# Patient Record
Sex: Male | Born: 1960
Health system: Southern US, Community
[De-identification: ages and names within clinical notes are randomized; demographics above are authoritative.]

## PROBLEM LIST (undated history)

## (undated) DIAGNOSIS — I5189 Other ill-defined heart diseases: Secondary | ICD-10-CM

## (undated) DIAGNOSIS — Z992 Dependence on renal dialysis: Secondary | ICD-10-CM

## (undated) DIAGNOSIS — L03119 Cellulitis of unspecified part of limb: Secondary | ICD-10-CM

## (undated) DIAGNOSIS — L02619 Cutaneous abscess of unspecified foot: Secondary | ICD-10-CM

## (undated) DIAGNOSIS — N186 End stage renal disease: Secondary | ICD-10-CM

## (undated) DIAGNOSIS — J189 Pneumonia, unspecified organism: Secondary | ICD-10-CM

## (undated) DIAGNOSIS — I119 Hypertensive heart disease without heart failure: Secondary | ICD-10-CM

## (undated) DIAGNOSIS — I5030 Unspecified diastolic (congestive) heart failure: Secondary | ICD-10-CM

## (undated) DIAGNOSIS — I10 Essential (primary) hypertension: Secondary | ICD-10-CM

## (undated) DIAGNOSIS — F1410 Cocaine abuse, uncomplicated: Secondary | ICD-10-CM

## (undated) DIAGNOSIS — E119 Type 2 diabetes mellitus without complications: Secondary | ICD-10-CM

## (undated) DIAGNOSIS — K219 Gastro-esophageal reflux disease without esophagitis: Secondary | ICD-10-CM

## (undated) DIAGNOSIS — D649 Anemia, unspecified: Secondary | ICD-10-CM

## (undated) DIAGNOSIS — I739 Peripheral vascular disease, unspecified: Secondary | ICD-10-CM

## (undated) DIAGNOSIS — E46 Unspecified protein-calorie malnutrition: Secondary | ICD-10-CM

## (undated) HISTORY — DX: Cellulitis of unspecified part of limb: L03.119

## (undated) HISTORY — DX: Dependence on renal dialysis: N18.6

## (undated) HISTORY — DX: Hypertensive heart disease without heart failure: I11.9

## (undated) HISTORY — DX: Dependence on renal dialysis: Z99.2

## (undated) HISTORY — DX: Cutaneous abscess of unspecified foot: L02.619

---
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
## 2007-09-04 HISTORY — PX: KNEE SURGERY: SHX244

## 2010-09-06 ENCOUNTER — Encounter
Admission: RE | Admit: 2010-09-06 | Discharge: 2010-09-06 | Payer: Self-pay | Source: Home / Self Care | Attending: Family Medicine | Admitting: Family Medicine

## 2011-08-23 ENCOUNTER — Ambulatory Visit: Payer: Worker's Compensation

## 2011-08-23 ENCOUNTER — Encounter: Payer: Self-pay | Admitting: Emergency Medicine

## 2011-08-23 ENCOUNTER — Inpatient Hospital Stay (HOSPITAL_COMMUNITY)
Admission: EM | Admit: 2011-08-23 | Discharge: 2011-08-27 | DRG: 603 | Disposition: A | Discharge status: Home / Self Care | Payer: Worker's Compensation | Attending: Internal Medicine | Admitting: Internal Medicine

## 2011-08-23 DIAGNOSIS — X58XXXA Exposure to other specified factors, initial encounter: Secondary | ICD-10-CM | POA: Diagnosis present

## 2011-08-23 DIAGNOSIS — D509 Iron deficiency anemia, unspecified: Secondary | ICD-10-CM | POA: Diagnosis present

## 2011-08-23 DIAGNOSIS — M795 Residual foreign body in soft tissue: Secondary | ICD-10-CM

## 2011-08-23 DIAGNOSIS — L02619 Cutaneous abscess of unspecified foot: Principal | ICD-10-CM | POA: Diagnosis present

## 2011-08-23 DIAGNOSIS — S91109A Unspecified open wound of unspecified toe(s) without damage to nail, initial encounter: Secondary | ICD-10-CM | POA: Diagnosis present

## 2011-08-23 DIAGNOSIS — D631 Anemia in chronic kidney disease: Secondary | ICD-10-CM | POA: Diagnosis present

## 2011-08-23 DIAGNOSIS — L0390 Cellulitis, unspecified: Secondary | ICD-10-CM

## 2011-08-23 DIAGNOSIS — D649 Anemia, unspecified: Secondary | ICD-10-CM | POA: Diagnosis present

## 2011-08-23 DIAGNOSIS — L03039 Cellulitis of unspecified toe: Principal | ICD-10-CM | POA: Diagnosis present

## 2011-08-23 DIAGNOSIS — R739 Hyperglycemia, unspecified: Secondary | ICD-10-CM | POA: Diagnosis present

## 2011-08-23 DIAGNOSIS — I10 Essential (primary) hypertension: Secondary | ICD-10-CM | POA: Diagnosis present

## 2011-08-23 DIAGNOSIS — E1169 Type 2 diabetes mellitus with other specified complication: Secondary | ICD-10-CM | POA: Diagnosis present

## 2011-08-23 NOTE — ED Notes (Signed)
PT. REPORTS METAL INSIDE LEFT BIG TOE , SEEN AT AN URGENT CARE FOR LEFT BIG TOE PAIN AND SWELLING.

## 2011-08-24 ENCOUNTER — Encounter (HOSPITAL_COMMUNITY): Payer: Self-pay | Admitting: Emergency Medicine

## 2011-08-24 ENCOUNTER — Emergency Department (HOSPITAL_COMMUNITY): Payer: Worker's Compensation

## 2011-08-24 DIAGNOSIS — L02619 Cutaneous abscess of unspecified foot: Secondary | ICD-10-CM | POA: Diagnosis present

## 2011-08-24 DIAGNOSIS — R739 Hyperglycemia, unspecified: Secondary | ICD-10-CM | POA: Diagnosis present

## 2011-08-24 DIAGNOSIS — L03119 Cellulitis of unspecified part of limb: Secondary | ICD-10-CM

## 2011-08-24 DIAGNOSIS — D649 Anemia, unspecified: Secondary | ICD-10-CM | POA: Diagnosis present

## 2011-08-24 HISTORY — DX: Cutaneous abscess of unspecified foot: L02.619

## 2011-08-24 HISTORY — DX: Cellulitis of unspecified part of limb: L03.119

## 2011-08-24 LAB — IRON AND TIBC
Iron: 33 ug/dL — ABNORMAL LOW (ref 42–135)
Saturation Ratios: 17 % — ABNORMAL LOW (ref 20–55)
TIBC: 192 ug/dL — ABNORMAL LOW (ref 215–435)
UIBC: 159 ug/dL (ref 125–400)

## 2011-08-24 LAB — DIFFERENTIAL
Basophils Absolute: 0 10*3/uL (ref 0.0–0.1)
Lymphocytes Relative: 21 % (ref 12–46)
Lymphs Abs: 2.8 10*3/uL (ref 0.7–4.0)
Monocytes Absolute: 1.3 10*3/uL — ABNORMAL HIGH (ref 0.1–1.0)
Monocytes Relative: 9 % (ref 3–12)
Neutro Abs: 8.9 10*3/uL — ABNORMAL HIGH (ref 1.7–7.7)

## 2011-08-24 LAB — CBC
HCT: 34.6 % — ABNORMAL LOW (ref 39.0–52.0)
HCT: 35.2 % — ABNORMAL LOW (ref 39.0–52.0)
Hemoglobin: 11.6 g/dL — ABNORMAL LOW (ref 13.0–17.0)
Hemoglobin: 11.7 g/dL — ABNORMAL LOW (ref 13.0–17.0)
MCV: 92.3 fL (ref 78.0–100.0)
RBC: 3.75 MIL/uL — ABNORMAL LOW (ref 4.22–5.81)
RBC: 3.82 MIL/uL — ABNORMAL LOW (ref 4.22–5.81)
RDW: 13.3 % (ref 11.5–15.5)
WBC: 10.8 10*3/uL — ABNORMAL HIGH (ref 4.0–10.5)
WBC: 13.4 10*3/uL — ABNORMAL HIGH (ref 4.0–10.5)

## 2011-08-24 LAB — FOLATE: Folate: 7.5 ng/mL

## 2011-08-24 LAB — COMPREHENSIVE METABOLIC PANEL
Albumin: 1.9 g/dL — ABNORMAL LOW (ref 3.5–5.2)
BUN: 19 mg/dL (ref 6–23)
Calcium: 8.6 mg/dL (ref 8.4–10.5)
Creatinine, Ser: 1.25 mg/dL (ref 0.50–1.35)
GFR calc Af Amer: 76 mL/min — ABNORMAL LOW (ref >90)
Glucose, Bld: 198 mg/dL — ABNORMAL HIGH (ref 70–99)
Potassium: 3.5 mEq/L (ref 3.5–5.1)
Total Protein: 6 g/dL (ref 6.0–8.3)

## 2011-08-24 LAB — RETICULOCYTES: Retic Count, Absolute: 45 10*3/uL (ref 19.0–186.0)

## 2011-08-24 LAB — POCT I-STAT, CHEM 8
BUN: 22 mg/dL (ref 6–23)
Calcium, Ion: 1.2 mmol/L (ref 1.12–1.32)
Chloride: 106 mEq/L (ref 96–112)
Creatinine, Ser: 1.4 mg/dL — ABNORMAL HIGH (ref 0.50–1.35)
Glucose, Bld: 260 mg/dL — ABNORMAL HIGH (ref 70–99)

## 2011-08-24 LAB — FERRITIN: Ferritin: 141 ng/mL (ref 22–322)

## 2011-08-24 LAB — GLUCOSE, CAPILLARY: Glucose-Capillary: 151 mg/dL — ABNORMAL HIGH (ref 70–99)

## 2011-08-24 LAB — HEMOGLOBIN A1C: Hgb A1c MFr Bld: 8.2 % — ABNORMAL HIGH (ref <5.7)

## 2011-08-24 MED ORDER — INSULIN ASPART 100 UNIT/ML ~~LOC~~ SOLN
0.0000 [IU] | Freq: Three times a day (TID) | SUBCUTANEOUS | Status: DC
  Administered 2011-08-24: 2 [IU] via SUBCUTANEOUS
  Administered 2011-08-24: 1 [IU] via SUBCUTANEOUS
  Administered 2011-08-25 – 2011-08-26 (×3): 2 [IU] via SUBCUTANEOUS
  Administered 2011-08-26 – 2011-08-27 (×2): 1 [IU] via SUBCUTANEOUS
  Filled 2011-08-24: qty 3

## 2011-08-24 MED ORDER — ACETAMINOPHEN 325 MG PO TABS
650.0000 mg | ORAL_TABLET | Freq: Four times a day (QID) | ORAL | Status: DC | PRN
  Filled 2011-08-24: qty 2

## 2011-08-24 MED ORDER — FENTANYL CITRATE 0.05 MG/ML IJ SOLN
50.0000 ug | Freq: Once | INTRAMUSCULAR | Status: DC
  Filled 2011-08-24: qty 2

## 2011-08-24 MED ORDER — TETANUS-DIPHTH-ACELL PERTUSSIS 5-2.5-18.5 LF-MCG/0.5 IM SUSP
0.5000 mL | Freq: Once | INTRAMUSCULAR | Status: AC
  Administered 2011-08-24: 0.5 mL via INTRAMUSCULAR
  Filled 2011-08-24: qty 0.5

## 2011-08-24 MED ORDER — PIPERACILLIN-TAZOBACTAM 3.375 G IVPB
3.3750 g | Freq: Three times a day (TID) | INTRAVENOUS | Status: DC
  Administered 2011-08-24 – 2011-08-27 (×10): 3.375 g via INTRAVENOUS
  Filled 2011-08-24 (×13): qty 50

## 2011-08-24 MED ORDER — OXYCODONE HCL 5 MG PO TABS
5.0000 mg | ORAL_TABLET | ORAL | Status: DC | PRN
  Administered 2011-08-24 – 2011-08-27 (×10): 5 mg via ORAL
  Filled 2011-08-24 (×11): qty 1

## 2011-08-24 MED ORDER — VANCOMYCIN HCL IN DEXTROSE 1-5 GM/200ML-% IV SOLN: 1000.0000 mg | Freq: Once | INTRAVENOUS | Status: DC

## 2011-08-24 MED ORDER — FENTANYL CITRATE 0.05 MG/ML IJ SOLN: 12.5000 ug | INTRAMUSCULAR | Status: DC | PRN

## 2011-08-24 MED ORDER — VANCOMYCIN HCL IN DEXTROSE 1-5 GM/200ML-% IV SOLN
1000.0000 mg | Freq: Two times a day (BID) | INTRAVENOUS | Status: DC
  Administered 2011-08-24 – 2011-08-27 (×7): 1000 mg via INTRAVENOUS
  Filled 2011-08-24 (×10): qty 200

## 2011-08-24 MED ORDER — ONDANSETRON HCL 4 MG PO TABS: 4.0000 mg | ORAL_TABLET | Freq: Four times a day (QID) | ORAL | Status: DC | PRN

## 2011-08-24 MED ORDER — ONDANSETRON HCL 4 MG/2ML IJ SOLN: 4.0000 mg | Freq: Four times a day (QID) | INTRAMUSCULAR | Status: DC | PRN

## 2011-08-24 MED ORDER — ACETAMINOPHEN 650 MG RE SUPP: 650.0000 mg | Freq: Four times a day (QID) | RECTAL | Status: DC | PRN

## 2011-08-24 MED ORDER — PIPERACILLIN-TAZOBACTAM 3.375 G IVPB
3.3750 g | Freq: Once | INTRAVENOUS | Status: AC
  Administered 2011-08-24: 3.375 g via INTRAVENOUS
  Filled 2011-08-24: qty 50

## 2011-08-24 MED ORDER — LIVING WELL WITH DIABETES BOOK
Freq: Once | Status: AC
  Administered 2011-08-24: 22:00:00
  Filled 2011-08-24: qty 1

## 2011-08-24 MED ORDER — SODIUM CHLORIDE 0.9 % IV SOLN
INTRAVENOUS | Status: DC
  Administered 2011-08-24: 1000 mL via INTRAVENOUS
  Administered 2011-08-25 – 2011-08-27 (×5): via INTRAVENOUS

## 2011-08-24 NOTE — Progress Notes (Signed)
ANTIBIOTIC CONSULT NOTE - INITIAL  Pharmacy Consult for Vancocin/Zosyn Indication: cellulitis  No Known Allergies  Patient Measurements: Height: 5\' 11"  (180.3 cm) Weight: 194 lb (87.998 kg) IBW/kg (Calculated) : 75.3   Vital Signs: Temp: 98 F (36.7 C) (12/21 0242) Temp src: Oral (12/21 0242) BP: 159/88 mmHg (12/21 0242) Pulse Rate: 78  (12/21 0242)  Labs:  Basename 08/24/11 0041 08/24/11 0017  WBC -- 13.4*  HGB 11.6* 11.7*  PLT -- 396  LABCREA -- --  CREATININE 1.40* --   Estimated Creatinine Clearance: 67.2 ml/min (by C-G formula based on Cr of 1.4).  Microbiology: No results found for this or any previous visit (from the past 720 hour(s)).  Medical History: History reviewed. No pertinent past medical history.  Medications:  Prescriptions prior to admission  Medication Sig Dispense Refill  . ibuprofen (ADVIL,MOTRIN) 200 MG tablet Take 400 mg by mouth every 4 (four) hours as needed. For pain        Scheduled:    . insulin aspart  0-9 Units Subcutaneous TID WC  . piperacillin-tazobactam (ZOSYN)  IV  3.375 g Intravenous Once  . TDaP  0.5 mL Intramuscular Once  . vancomycin  1,000 mg Intravenous Once  . DISCONTD: fentaNYL  50 mcg Intravenous Once   Assessment: 51yo male c/o swelling and pain of L foot x3-4d, Xray at urgent care showed small foreign body in distal phalynx, to begin IV ABX for cellulitis while awaiting ortho consult.  Goal of Therapy:  Vancomycin trough level 10-15 mcg/ml  Plan:  Will begin vancomycin 1000mg  IV Q12H and Zosyn 3.375g IV Q8H.  Monitor CBC, Cx, levels prn.  Colleen Can PharmD BCPS 08/24/2011,5:51 AM

## 2011-08-24 NOTE — Progress Notes (Signed)
Went in to speak with pt with new onset diabetes.  Reviewed basic home care.  Reviewed A1C goals and CBG goals with pt.  Ordered RD consult, DM videos, DM booklet, [facility] nursing notes.  RN to provide continuing DM education at bedside.  Placed CBG meter Rx in shadow chart.  RN to review proper CBG testing with pt.  Will likely be d/c'd home on oral medications.  Encouraged pt to keep a CBG diary.  Told pt about inexpensive CBG meter he can purchase OTC at [facility].  Will place care mgmt consult for pt.  He currently does not have a primary MD.  MD- Please order Outpatient Diabetes Education for pt (this order can be found under Order Mgmt tab of EPIC- type in "Outpatient Diabetes Education"  Will follow.

## 2011-08-24 NOTE — Progress Notes (Signed)
Orthopedic Tech Progress Note Patient Details:  [name] 05/20/1961 [id number]  Other Ortho Devices Type of Ortho Device: CAM walker   Cammer, Mickie Bail 08/24/2011, 2:19 PM

## 2011-08-24 NOTE — Progress Notes (Addendum)
No primary provider on file. Chief Complaint: Swelling left great toe  History:  50 yr old with 3-4 days of welling and mild tenderness in the left great toe.  Admitted for diabetic cellulitis of the toe.  No evidence of osteomyelitis.  Xrays show small metallic foreign body. As a result ortho consult was requested.  History reviewed. No pertinent past medical history.  No Known Allergies  No current facility-administered medications on file prior to encounter.   No current outpatient prescriptions on file prior to encounter.    Physical Exam: Filed Vitals:   08/24/11 0552  BP: 168/89  Pulse: 85  Temp: 97.9 F (36.6 C)  Resp: 18   A+O X3 No sob/cp abd soft/nt 1+ DP/PT bilaterally Decreased sensation to light touch diffusely in the LE Ambulating No focal neuro deficits Swelling left great toe.  No open wound  Minimal erythema  Image: Dg Foot Complete Left  08/24/2011  *RADIOLOGY REPORT*  Clinical Data: Pain, spiraling, infection of great toe, metal sliver in the left great toe  LEFT FOOT - COMPLETE 3+ VIEW  Comparison: None.  Findings: No fracture or dislocation is seen.  The joint spaces are preserved.  14 mm radiopaque foreign body along the plantar aspect of the first distal phalanx.  Associated soft tissue swelling of the first digit.  Vascular calcifications.  Plantar and posterior calcaneal enthesopathy.  IMPRESSION: No fracture or dislocation is seen.  14 mm radiopaque foreign body along the plantar aspect of the first distal phalanx.  Associated soft tissue swelling of the first digit.  Original Report Authenticated By: Charline Bills, M.D.    A/P: 50 yr old diabetic male with cellulitis of the left great toe.  Will discuss case with my partner Dr Victorino Dike  Further recommendations pending Keep NPO for now   Add: Spoke with Dr Victorino Dike about patient Plan of action:  Treat cellulitis Patient will f/u as an outpatient with Dr Victorino Dike for removal of the foreign  body No need to remove object urgently/emergently Patient CAM boot when ambulating F/u 7 days with Dr Victorino Dike at [facility] [phone] Will sign off - please call with any issues

## 2011-08-24 NOTE — ED Provider Notes (Signed)
History     CSN: [id number]  Arrival date & time 08/23/11  2211   First MD Initiated Contact with Patient 08/24/11 0012      No chief complaint on file.   (Consider location/radiation/quality/duration/timing/severity/associated sxs/prior treatment) Patient is a 50 y.o. male presenting with lower extremity pain. The history is provided by the patient. No language interpreter was used.  Foot Pain This is a new problem. The current episode started yesterday. The problem occurs constantly. The problem has not changed since onset.Pertinent negatives include no chest pain, no abdominal pain, no headaches and no shortness of breath. The symptoms are aggravated by nothing. The symptoms are relieved by nothing. He has tried nothing for the symptoms. The treatment provided no relief.  Patient thought he had an insect bite on his left great toe as it was swollen and discolored.  The foot then began to swell and was seen at outside urgent care and diagnosed with skin infection and foreign body.  No f/c/r.  Unknown tetanus.    History reviewed. No pertinent past medical history.  Past Surgical History  Procedure Date  . Leg surgery     No family history on file.  History  Substance Use Topics  . Smoking status: Never Smoker   . Smokeless tobacco: Not on file  . Alcohol Use: No      Review of Systems  Constitutional: Negative for activity change.  HENT: Negative for facial swelling.   Eyes: Negative for discharge.  Respiratory: Negative for shortness of breath.   Cardiovascular: Negative for chest pain.  Gastrointestinal: Negative for abdominal pain and abdominal distention.  Genitourinary: Negative for difficulty urinating.  Musculoskeletal: Positive for joint swelling.  Skin: Positive for color change.  Neurological: Negative for headaches.  Hematological: Negative.   Psychiatric/Behavioral: Negative.     Allergies  Review of patient's allergies indicates no known  allergies.  Home Medications   Current Outpatient Rx  Name Route Sig Dispense Refill  . IBUPROFEN 200 MG PO TABS Oral Take 400 mg by mouth every 4 (four) hours as needed. For pain       BP 171/86  Pulse 105  Temp(Src) 98.2 F (36.8 C) (Oral)  Resp 20  SpO2 97%  Physical Exam  Constitutional: He is oriented to person, place, and time. He appears well-developed and well-nourished.  HENT:  Head: Normocephalic and atraumatic.  Mouth/Throat: Oropharynx is clear and moist.  Eyes: EOM are normal.  Neck: Normal range of motion. Neck supple.  Cardiovascular: Normal rate and regular rhythm.   Pulmonary/Chest: Effort normal and breath sounds normal.  Abdominal: Soft. Bowel sounds are normal. There is no tenderness. There is no rebound and no guarding.  Musculoskeletal: Normal range of motion. He exhibits edema.       Feet:  Neurological: He is alert and oriented to person, place, and time.  Skin: Skin is warm and dry. He is not diaphoretic.  Psychiatric: Thought content normal.    ED Course  Procedures (including critical care time)   Labs Reviewed  CBC  DIFFERENTIAL  I-STAT, CHEM 8   No results found.   No diagnosis found.    MDM  The patient appears reasonably stabilized for admission considering the current resources, flow, and capabilities available in the ED at this time, and I doubt any other [facility] requiring further screening and/or treatment in the ED prior to admission.        Jasmine Awe, MD 08/24/11 [phone]

## 2011-08-24 NOTE — ED Notes (Signed)
Report given to Tia RN. No questions and/or concerns. Zosyn with pt. Transported to [place]

## 2011-08-24 NOTE — ED Notes (Signed)
Pt stated that his L great toe has been intermittently swelling and throbbing since Sunday. Pt went to the urgent care today and they x-rayed his food. According to the patient he has metal in his toe. He does not know when this happened. Left foot and ankle are 2+ edema.  Dressing on L great toe DDI. Will continue to monitor.

## 2011-08-24 NOTE — H&P (Signed)
[name] is an 50 y.o. male.   PCP -None. Chief Complaint: Swelling left foot and pain. HPI: 49 year old male with no significant past medical history went to urgent care yesterday because of increasing swelling in his left foot with pain in his left great toe. He has been having these symptoms for last 3-4 days. Denies any fever chills or any trauma. At the urgent care they did x-ray of his left foot and part of small foreign body in the distal phalanx. He was referred to the ER. Patient in addition was found to be hyperglycemic in the range of being diabetic. At this time patient has been admitted for left foot cellulitis with foreign body and hyperglycemia.  History reviewed. No pertinent past medical history.  Past Surgical History  Procedure Date  . Leg surgery     Family History  Problem Relation Age of Onset  . Adopted: Yes   Social History:  reports that he has never smoked. He does not have any smokeless tobacco history on file. He reports that he does not drink alcohol or use illicit drugs.  Allergies: No Known Allergies  Medications Prior to Admission  Medication Dose Route Frequency Provider Last Rate Last Dose  . fentaNYL (SUBLIMAZE) injection 50 mcg  50 mcg Intravenous Once April K Palumbo-Rasch, MD      . piperacillin-tazobactam (ZOSYN) IVPB 3.375 g  3.375 g Intravenous Once April K Palumbo-Rasch, MD   3.375 g at 08/24/11 0227  . TDaP (BOOSTRIX) injection 0.5 mL  0.5 mL Intramuscular Once April K Palumbo-Rasch, MD   0.5 mL at 08/24/11 0045  . vancomycin (VANCOCIN) IVPB 1000 mg/200 mL premix  1,000 mg Intravenous Once April Smitty Cords, MD       No current outpatient prescriptions on file as of 08/23/2011.    Results for orders placed during the hospital encounter of 08/23/11 (from the past 48 hour(s))  CBC     Status: Abnormal   Collection Time   08/24/11 12:17 AM      Component Value Range Comment   WBC 13.4 (*) 4.0 - 10.5 (K/uL)    RBC 3.82 (*) 4.22 - 5.81  (MIL/uL)    Hemoglobin 11.7 (*) 13.0 - 17.0 (g/dL)    HCT 16.1 (*) 09.6 - 52.0 (%)    MCV 92.1  78.0 - 100.0 (fL)    MCH 30.6  26.0 - 34.0 (pg)    MCHC 33.2  30.0 - 36.0 (g/dL)    RDW 04.5  40.9 - 81.1 (%)    Platelets 396  150 - 400 (K/uL)   DIFFERENTIAL     Status: Abnormal   Collection Time   08/24/11 12:17 AM      Component Value Range Comment   Neutrophils Relative 66  43 - 77 (%)    Neutro Abs 8.9 (*) 1.7 - 7.7 (K/uL)    Lymphocytes Relative 21  12 - 46 (%)    Lymphs Abs 2.8  0.7 - 4.0 (K/uL)    Monocytes Relative 9  3 - 12 (%)    Monocytes Absolute 1.3 (*) 0.1 - 1.0 (K/uL)    Eosinophils Relative 3  0 - 5 (%)    Eosinophils Absolute 0.5  0.0 - 0.7 (K/uL)    Basophils Relative 0  0 - 1 (%)    Basophils Absolute 0.0  0.0 - 0.1 (K/uL)   POCT I-STAT, CHEM 8     Status: Abnormal   Collection Time   08/24/11 12:41 AM  Component Value Range Comment   Sodium 142  135 - 145 (mEq/L)    Potassium 3.6  3.5 - 5.1 (mEq/L)    Chloride 106  96 - 112 (mEq/L)    BUN 22  6 - 23 (mg/dL)    Creatinine, Ser 1.61 (*) 0.50 - 1.35 (mg/dL)    Glucose, Bld 096 (*) 70 - 99 (mg/dL)    Calcium, Ion 0.45  1.12 - 1.32 (mmol/L)    TCO2 27  0 - 100 (mmol/L)    Hemoglobin 11.6 (*) 13.0 - 17.0 (g/dL)    HCT 40.9 (*) 81.1 - 52.0 (%)    Dg Foot Complete Left  08/24/2011  *RADIOLOGY REPORT*  Clinical Data: Pain, spiraling, infection of great toe, metal sliver in the left great toe  LEFT FOOT - COMPLETE 3+ VIEW  Comparison: None.  Findings: No fracture or dislocation is seen.  The joint spaces are preserved.  14 mm radiopaque foreign body along the plantar aspect of the first distal phalanx.  Associated soft tissue swelling of the first digit.  Vascular calcifications.  Plantar and posterior calcaneal enthesopathy.  IMPRESSION: No fracture or dislocation is seen.  14 mm radiopaque foreign body along the plantar aspect of the first distal phalanx.  Associated soft tissue swelling of the first digit.   Original Report Authenticated By: Charline Bills, M.D.    Review of Systems  : acuity of condition  Constitutional: Negative.   HENT: Negative.   Eyes: Negative.   Respiratory: Negative.   Cardiovascular: Negative.   Gastrointestinal: Negative.   Genitourinary: Negative.   Musculoskeletal:       Left foot swelling and pain.  Skin: Negative.   Neurological: Negative.   Endo/Heme/Allergies: Negative.   Psychiatric/Behavioral: Negative.     Blood pressure 159/88, pulse 78, temperature 98 F (36.7 C), temperature source Oral, resp. rate 18, SpO2 96.00%. Physical Exam  Constitutional: He is oriented to person, place, and time. He appears well-developed and well-nourished. No distress.  HENT:  Head: Normocephalic and atraumatic.  Right Ear: External ear normal.  Left Ear: External ear normal.  Nose: Nose normal.  Mouth/Throat: Oropharynx is clear and moist. No oropharyngeal exudate.  Eyes: Conjunctivae and EOM are normal. Pupils are equal, round, and reactive to light. Right eye exhibits no discharge. Left eye exhibits no discharge. No scleral icterus.  Neck: Normal range of motion. Neck supple.  Cardiovascular: Normal rate and regular rhythm.   Respiratory: Effort normal and breath sounds normal. No respiratory distress. He has no wheezes.  GI: Soft. Bowel sounds are normal. He exhibits no distension. There is no tenderness. There is no rebound.  Musculoskeletal:       Swelling of the left foot and tenderness in the left great toe. Able to flex and extend foot.  Neurological: He is alert and oriented to person, place, and time. He has normal reflexes. No cranial nerve deficit.  Skin: Skin is warm and dry. He is not diaphoretic. There is erythema.  Psychiatric: His behavior is normal.     Assessment/Plan #1. Left foot cellulitis with foreign body in the distal phalanx of the first toe - patient has been started on vancomycin and Zosyn which we will continue. We will get  orthopedic consult to remove the foreign body. #2. Hyperglycemia - will check hemoglobin A1c. Patient probably has new onset diabetes mellitus2. For now we will place him on sliding scale insulin. #3. Anemia - check anemia panel and repeat CBC in a.m. patient will need anemia  workup through his primary care once set up.  CODE STATUS - full code.  Samarrah Tranchina N. 08/24/2011, 3:20 AM

## 2011-08-25 LAB — GLUCOSE, CAPILLARY
Glucose-Capillary: 161 mg/dL — ABNORMAL HIGH (ref 70–99)
Glucose-Capillary: 96 mg/dL (ref 70–99)

## 2011-08-25 MED ORDER — BISACODYL 5 MG PO TBEC: 5.0000 mg | DELAYED_RELEASE_TABLET | Freq: Every day | ORAL | Status: DC | PRN

## 2011-08-25 MED ORDER — METFORMIN HCL ER 500 MG PO TB24
500.0000 mg | ORAL_TABLET | Freq: Every day | ORAL | Status: DC
  Administered 2011-08-26 – 2011-08-27 (×2): 500 mg via ORAL
  Filled 2011-08-25 (×3): qty 1

## 2011-08-25 NOTE — Progress Notes (Signed)
Subjective: Doing better, pain reasonably controlled  Objective: Vital signs in last 24 hours: Temp:  [97.6 F (36.4 C)-97.7 F (36.5 C)] 97.7 F (36.5 C) (12/22 0604) Pulse Rate:  [75-79] 75  (12/22 0604) Resp:  [19] 19  (12/22 0604) BP: (159-166)/(90-91) 159/91 mmHg (12/22 0604) SpO2:  [94 %] 94 % (12/22 0604) Weight change:  Last BM Date: 08/23/11  Intake/Output from previous day: 12/21 0701 - 12/22 0700 In: 1300 [I.V.:1000; IV Piggyback:300] Out: 1000 [Urine:1000]    Physical Exam: General: Alert, awake, oriented x3, in no acute distress. HEENT: No bruits, no goiter. Heart: Regular rate and rhythm, without murmurs, rubs, gallops. Lungs: Clear to auscultation bilaterally. Abdomen: Soft, nontender, nondistended, positive bowel sounds. Extremities: Right big toe with swelling, discoloration and tenderness Neuro: Grossly intact, nonfocal.  Lab Results: Basic Metabolic Panel:  Basename 08/24/11 0535 08/24/11 0041  NA 138 142  K 3.5 3.6  CL 106 106  CO2 25 --  GLUCOSE 198* 260*  BUN 19 22  CREATININE 1.25 1.40*  CALCIUM 8.6 --  MG -- --  PHOS -- --   Liver Function Tests:  Basename 08/24/11 0535  AST 17  ALT 21  ALKPHOS 104  BILITOT 0.3  PROT 6.0  ALBUMIN 1.9*   No results found for this basename: LIPASE:2,AMYLASE:2 in the last 72 hours No results found for this basename: AMMONIA:2 in the last 72 hours CBC:  Basename 08/24/11 0535 08/24/11 0041 08/24/11 0017  WBC 10.8* -- 13.4*  NEUTROABS -- -- 8.9*  HGB 11.6* 11.6* --  HCT 34.6* 34.0* --  MCV 92.3 -- 92.1  PLT 393 -- 396   Cardiac Enzymes: No results found for this basename: CKTOTAL:3,CKMB:3,CKMBINDEX:3,TROPONINI:3 in the last 72 hours BNP: No components found with this basename: POCBNP:3 D-Dimer: No results found for this basename: DDIMER:2 in the last 72 hours CBG:  Basename 08/25/11 0827 08/25/11 0537 08/24/11 2202 08/24/11 1716 08/24/11 1123 08/24/11 0642  GLUCAP 161* 128* 105* 151* 132*  174*   Hemoglobin A1C:  Basename 08/24/11 0536  HGBA1C 8.2*   Fasting Lipid Panel: No results found for this basename: CHOL,HDL,LDLCALC,TRIG,CHOLHDL,LDLDIRECT in the last 72 hours Thyroid Function Tests: No results found for this basename: TSH,T4TOTAL,FREET4,T3FREE,THYROIDAB in the last 72 hours Anemia Panel:  Basename 08/24/11 0535  VITAMINB12 340  FOLATE 7.5  FERRITIN 141  TIBC 192*  IRON 33*  RETICCTPCT 1.2   Coagulation: No results found for this basename: LABPROT:2,INR:2 in the last 72 hours Urine Drug Screen: Drugs of Abuse  No results found for this basename: labopia, cocainscrnur, labbenz, amphetmu, thcu, labbarb    Alcohol Level: No results found for this basename: ETH:2 in the last 72 hours  No results found for this or any previous visit (from the past 240 hour(s)).  Studies/Results: Dg Foot Complete Left  08/24/2011  *RADIOLOGY REPORT*  Clinical Data: Pain, spiraling, infection of great toe, metal sliver in the left great toe  LEFT FOOT - COMPLETE 3+ VIEW  Comparison: None.  Findings: No fracture or dislocation is seen.  The joint spaces are preserved.  14 mm radiopaque foreign body along the plantar aspect of the first distal phalanx.  Associated soft tissue swelling of the first digit.  Vascular calcifications.  Plantar and posterior calcaneal enthesopathy.  IMPRESSION: No fracture or dislocation is seen.  14 mm radiopaque foreign body along the plantar aspect of the first distal phalanx.  Associated soft tissue swelling of the first digit.  Original Report Authenticated By: Charline Bills, M.D.  Medications: Scheduled Meds:   . insulin aspart  0-9 Units Subcutaneous TID WC  . living well with diabetes book   Does not apply Once  . piperacillin-tazobactam (ZOSYN)  IV  3.375 g Intravenous Q8H  . vancomycin  1,000 mg Intravenous Q12H   Continuous Infusions:   . sodium chloride 100 mL/hr at 08/25/11 0454   PRN Meds:.acetaminophen, acetaminophen,  ondansetron (ZOFRAN) IV, ondansetron, oxyCODONE, DISCONTD: fentaNYL  Assessment/Plan:  1.Cellulitis and foreign body in Right foot and toe: continue Vanc/Zosyn today, Dc home tomorrow on Doxycycline and Augmentin, per Dr.Brooks he needs to Follow up with Dr.Hewitt 2. New Diabetes Mellitus: start metformin, DM education 3. Iron Deficiency anemia: start PO Iron and colonoscopy as outpatient 4. High BP: monitor for now, low salt diet, life style modification    LOS: 2 days   Eliaz Fout [facility] Pager: [phone] 08/25/2011, 9:05 AM

## 2011-08-25 NOTE — Consult Note (Signed)
Diet Education - Diabetic diet  - Reviewed sources of CHO in diet as well as recommend portion sizes. Pt eager to learn and wants to improve his health. Handout of information provided. Pt stated his biggest barrier will be limiting/avoiding sodas and excess fruit juices as well as eating 3 meals/day versus 1 meal/day. Discussed relationship between diabetes and wound healing.    Nutrition dx:  Nutrition-related knowledge deficit r/t diet therapy AEB MD request  Intervention:  Brief education;  Provided.  Goals of nutrition therapy discussed.  Understanding confirmed.    Monitoring:  Knowledge; for questions.  Please consult RD if new questions present.  Pager: [phone]

## 2011-08-26 LAB — GLUCOSE, CAPILLARY
Glucose-Capillary: 101 mg/dL — ABNORMAL HIGH (ref 70–99)
Glucose-Capillary: 109 mg/dL — ABNORMAL HIGH (ref 70–99)

## 2011-08-27 DIAGNOSIS — D631 Anemia in chronic kidney disease: Secondary | ICD-10-CM | POA: Diagnosis present

## 2011-08-27 DIAGNOSIS — I10 Essential (primary) hypertension: Secondary | ICD-10-CM | POA: Diagnosis present

## 2011-08-27 LAB — GLUCOSE, CAPILLARY: Glucose-Capillary: 116 mg/dL — ABNORMAL HIGH (ref 70–99)

## 2011-08-27 MED ORDER — LISINOPRIL 10 MG PO TABS: 10.0000 mg | ORAL_TABLET | Freq: Every day | ORAL | Status: DC

## 2011-08-27 MED ORDER — OXYCODONE-ACETAMINOPHEN 5-325 MG PO TABS: 1.0000 | ORAL_TABLET | Freq: Four times a day (QID) | ORAL | Status: DC | PRN

## 2011-08-27 MED ORDER — AMOXICILLIN-POT CLAVULANATE 875-125 MG PO TABS: 1.0000 | ORAL_TABLET | Freq: Two times a day (BID) | ORAL | Status: DC

## 2011-08-27 MED ORDER — DOXYCYCLINE HYCLATE 50 MG PO CAPS: 100.0000 mg | ORAL_CAPSULE | Freq: Two times a day (BID) | ORAL | Status: DC

## 2011-08-27 MED ORDER — METFORMIN HCL ER 500 MG PO TB24: 500.0000 mg | ORAL_TABLET | Freq: Every day | ORAL | Status: DC

## 2011-08-27 NOTE — Progress Notes (Signed)
   CARE MANAGEMENT NOTE 08/27/2011  Patient:  [name], [name]   Account Number:  [id number]  Date Initiated:  08/24/2011  Documentation initiated by:  Junius Creamer  Subjective/Objective Assessment:   adm w cellulits of foot     Action/Plan:   lives ? alone, no pcp listed   Anticipated DC Date:  08/27/2011   Anticipated DC Plan:  HOME/SELF CARE      DC Planning Services  CM consult  [facility]      Choice offered to / List presented to:             Status of service:   Medicare Important Message given?   (If response is "NO", the following Medicare IM given date fields will be blank) Date Medicare IM given:   Date Additional Medicare IM given:    Discharge Disposition:  HOME/SELF CARE  Per UR Regulation:  Reviewed for med. necessity/level of care/duration of stay  Comments:  12/24 dr hewitt's office closed 12/24 and 12/25 put phone # for md on dc instruct sheet for pt to call for appt later in week, debbie Iza Preston rn,bsn 12/21 debbie Rye Decoste rn,bsn [phone]  08/24/11 1645 Henrietta Mayo RN MSN CCM Received referral to provide information to pt on obtaining a PCP.  Per pt, he currently has no insurance, will choose an option through his employer when he returns to work. Provided pt with contact information for [facility] so that he will be able to arrange an appt while waiting for insurance.  Also provided pt with [facility] information so he can find an MD after he is insured.  Pt states he will definitely follow up on both, also has information re [employer] $4 list, and glucometer/strips.

## 2011-08-27 NOTE — Discharge Summary (Signed)
Physician Discharge Summary  Patient ID: [name] MRN: [id number] DOB/AGE: [birth year] 50 y.o.  Admit date: 08/23/2011 Discharge date: 08/27/2011  Primary Care Physician:  Gentry Fitz  Discharge Diagnoses:   1. cellulitis with foreign body of the left foot and toe 2. diabetes mellitus type 2 new diagnosis 3. Hypertension 4. iron deficiency anemia   Current Discharge Medication List    START taking these medications   Details  amoxicillin-clavulanate (AUGMENTIN) 875-125 MG per tablet Take 1 tablet by mouth 2 (two) times daily. Qty: 20 tablet, Refills: 0    doxycycline (VIBRAMYCIN) 50 MG capsule Take 2 capsules (100 mg total) by mouth 2 (two) times daily. Qty: 20 capsule, Refills: 0    lisinopril (ZESTRIL) 10 MG tablet Take 1 tablet (10 mg total) by mouth daily. Qty: 30 tablet, Refills: 0    metFORMIN (GLUCOPHAGE-XR) 500 MG 24 hr tablet Take 1 tablet (500 mg total) by mouth daily with breakfast. Qty: 30 tablet, Refills: 0    oxyCODONE-acetaminophen (ROXICET) 5-325 MG per tablet Take 1 tablet by mouth every 6 (six) hours as needed for pain. Qty: 30 tablet, Refills: 0      STOP taking these medications     ibuprofen (ADVIL,MOTRIN) 200 MG tablet        Disposition and Follow-up:  1. Dr. Toni Arthurs within 1 week 2. PCP/ possibly [facility] in 2-3 weeks 3. gastroenterologist  in 2-3 months for screening colonoscopy  Consults: Dr. Shon Baton orthopedics   Significant Diagnostic Studies:  1. Dg Foot Complete Left  IMPRESSION: No fracture or dislocation is seen.  14 mm radiopaque foreign body along the plantar aspect of the first distal phalanx.  Associated soft tissue swelling of the first digit.  Original Report Authenticated By: Charline Bills, M.D.    Brief H and P: [name] is a 50 year old gentleman with no past medical history presented to a local urgent care with increased swelling and pain in his left foot and toe for 3-4 days, he had an x-ray at the  urgent care which showed foreign body in his distal phalanx and was referred to emergency room further management and evaluation.  Hospital Course:  1.Cellulitis and abscess of foot: He was admitted to the hospital started on IV antibiotics namely vancomycin and Unasyn, had good clinical improvement from the cellulitis standpoint, he was also seen by Dr. Shon Baton in consultation for a foreign body in his left toe, Brooks recommended antibiotics and followup with Dr. Toni Arthurs in his office in the next week for foreign body removal. At this point he is being transitioned to oral antibiotics for 10 more days and strictly advised to followup with Dr. Victorino Dike in his office so that the metal in his distal phalanx can be removed. 2. Diabetes mellitus: Was diagnosed this admission hemoglobin A1c was 8.2 started on metformin, advised to get a PCP and followup in 2-3 weeks, receiving patient diabetic education as well 3. Hypertension: Started on lisinopril  4. Iron deficiency anemia: Recommended to have screening colonoscopy as outpatient to evaluate for colon polyps or cancer   Time spent on Discharge:  Signed: Stancil Deisher [facility] Pager: [phone] 08/27/2011, 12:55 PM

## 2011-08-27 NOTE — Progress Notes (Signed)
ANTIBIOTIC CONSULT NOTE - Follow-up  Pharmacy Consult for Vancocin/Zosyn Indication: diabetic cellulitis of L great toe  No Known Allergies  Patient Measurements: Height: 5\' 11"  (180.3 cm) Weight: 194 lb (87.998 kg) IBW/kg (Calculated) : 75.3   Vital Signs: Temp: 98.5 F (36.9 C) (12/24 0500) Temp src: Oral (12/24 0500) BP: 160/90 mmHg (12/24 0500) Pulse Rate: 75  (12/24 0500)  Labs: No results found for this basename: WBC:3,HGB:3,PLT:3,LABCREA:3,CREATININE:3 in the last 72 hours Estimated Creatinine Clearance: 75.3 ml/min (by C-G formula based on Cr of 1.25).  Microbiology: Recent Results (from the past 720 hour(s))  CULTURE, BLOOD (ROUTINE X 2)     Status: Normal (Preliminary result)   Collection Time   08/24/11  1:25 AM      Component Value Range Status Comment   Specimen Description BLOOD LEFT ARM   Final    Special Requests BOTTLES DRAWN AEROBIC AND ANAEROBIC 10CC EACH   Final    Setup Time [id number]   Final    Culture     Final    Value:        BLOOD CULTURE RECEIVED NO GROWTH TO DATE CULTURE WILL BE HELD FOR 5 DAYS BEFORE ISSUING A FINAL NEGATIVE REPORT   Report Status PENDING   Incomplete   CULTURE, BLOOD (ROUTINE X 2)     Status: Normal (Preliminary result)   Collection Time   08/24/11  1:35 AM      Component Value Range Status Comment   Specimen Description BLOOD RIGHT HAND   Final    Special Requests BOTTLES DRAWN AEROBIC AND ANAEROBIC 10CC EACH   Final    Setup Time [id number]   Final    Culture     Final    Value:        BLOOD CULTURE RECEIVED NO GROWTH TO DATE CULTURE WILL BE HELD FOR 5 DAYS BEFORE ISSUING A FINAL NEGATIVE REPORT   Report Status PENDING   Incomplete     Medical History: Past Medical History  Diagnosis Date  . Diabetes mellitus     New onset    Medications:  Prescriptions prior to admission  Medication Sig Dispense Refill  . ibuprofen (ADVIL,MOTRIN) 200 MG tablet Take 400 mg by mouth every 4 (four) hours as needed. For pain         Scheduled:     . insulin aspart  0-9 Units Subcutaneous TID WC  . metFORMIN  500 mg Oral Q breakfast  . piperacillin-tazobactam (ZOSYN)  IV  3.375 g Intravenous Q8H  . vancomycin  1,000 mg Intravenous Q12H   Assessment: 50yo male c/o swelling and pain of L foot x3-4d, Xray at urgent care showed small foreign body in distal phalynx, on IV Zosyn 3.375 q8 hrs and vancomycin 1g q 12 hrs.  No BMET since 12/21.  Cultures negative thus far.  Goal of Therapy:  Vancomycin trough level 10-15 mcg/ml  Plan:  Continue vancomycin 1000mg  IV Q12H and Zosyn 3.375g IV Q8H.  Noted plans to change to po antibiotics at discharge.  Monitor CBC, Cx, levels prn.  Gardner Candle PharmD BCPS 08/27/2011,9:12 AM

## 2011-08-30 LAB — CULTURE, BLOOD (ROUTINE X 2)
Culture  Setup Time: 201212210914
Culture: NO GROWTH

## 2011-09-04 ENCOUNTER — Telehealth (HOSPITAL_COMMUNITY): Payer: Self-pay | Admitting: *Deleted

## 2011-09-05 ENCOUNTER — Inpatient Hospital Stay (HOSPITAL_COMMUNITY)
Admission: EM | Admit: 2011-09-05 | Discharge: 2011-09-10 | DRG: 617 | Disposition: A | Discharge status: Home Health | Payer: Self-pay | Attending: Internal Medicine | Admitting: Internal Medicine

## 2011-09-05 DIAGNOSIS — L02619 Cutaneous abscess of unspecified foot: Secondary | ICD-10-CM | POA: Diagnosis present

## 2011-09-05 DIAGNOSIS — L03039 Cellulitis of unspecified toe: Secondary | ICD-10-CM | POA: Diagnosis present

## 2011-09-05 DIAGNOSIS — I10 Essential (primary) hypertension: Secondary | ICD-10-CM | POA: Diagnosis present

## 2011-09-05 DIAGNOSIS — E1169 Type 2 diabetes mellitus with other specified complication: Principal | ICD-10-CM | POA: Diagnosis present

## 2011-09-05 DIAGNOSIS — L97509 Non-pressure chronic ulcer of other part of unspecified foot with unspecified severity: Secondary | ICD-10-CM | POA: Diagnosis present

## 2011-09-05 DIAGNOSIS — E11621 Type 2 diabetes mellitus with foot ulcer: Secondary | ICD-10-CM

## 2011-09-05 DIAGNOSIS — D631 Anemia in chronic kidney disease: Secondary | ICD-10-CM | POA: Diagnosis present

## 2011-09-05 DIAGNOSIS — I96 Gangrene, not elsewhere classified: Secondary | ICD-10-CM | POA: Diagnosis present

## 2011-09-05 DIAGNOSIS — M869 Osteomyelitis, unspecified: Secondary | ICD-10-CM | POA: Diagnosis present

## 2011-09-05 DIAGNOSIS — M9080 Osteopathy in diseases classified elsewhere, unspecified site: Secondary | ICD-10-CM | POA: Diagnosis present

## 2011-09-05 DIAGNOSIS — D509 Iron deficiency anemia, unspecified: Secondary | ICD-10-CM | POA: Diagnosis present

## 2011-09-05 MED ORDER — SODIUM CHLORIDE 0.9 % IV SOLN
INTRAVENOUS | Status: DC
  Administered 2011-09-06: via INTRAVENOUS

## 2011-09-05 NOTE — ED Notes (Addendum)
Pt was told to come to ED for admission for toe infection. Pt reports that they thought he had frost bite 3 weeks ago and then he was here before christmas and was told he had metal in his with no surgery because of where metal was. Pt states when he was discharged he was given rx for pain, antibiotic, and diabetes. Pt states he only filled his rx for pain medication did not fill other medications. Pt had appt this afternoon with doctor and infection noted. Seen by Dr. Victorino Dike, [facility]. Pt states that Dr Victorino Dike stated he would see him tonight or in the morning.

## 2011-09-06 ENCOUNTER — Encounter (HOSPITAL_COMMUNITY): Payer: Self-pay

## 2011-09-06 ENCOUNTER — Inpatient Hospital Stay: Admit: 2011-09-06 | Payer: Self-pay | Admitting: Orthopedic Surgery

## 2011-09-06 ENCOUNTER — Other Ambulatory Visit: Payer: Self-pay | Admitting: Orthopedic Surgery

## 2011-09-06 ENCOUNTER — Encounter (HOSPITAL_COMMUNITY)
Admission: EM | Disposition: A | Discharge status: Other Acute Inpatient Hospital | Payer: Self-pay | Source: Home / Self Care | Attending: Internal Medicine

## 2011-09-06 ENCOUNTER — Inpatient Hospital Stay (HOSPITAL_COMMUNITY): Payer: Self-pay | Admitting: *Deleted

## 2011-09-06 ENCOUNTER — Encounter (HOSPITAL_COMMUNITY): Payer: Self-pay | Admitting: *Deleted

## 2011-09-06 ENCOUNTER — Emergency Department (HOSPITAL_COMMUNITY): Payer: Self-pay

## 2011-09-06 HISTORY — PX: AMPUTATION: SHX166

## 2011-09-06 LAB — COMPREHENSIVE METABOLIC PANEL
ALT: 15 U/L (ref 0–53)
AST: 15 U/L (ref 0–37)
Albumin: 2.5 g/dL — ABNORMAL LOW (ref 3.5–5.2)
BUN: 17 mg/dL (ref 6–23)
CO2: 25 mEq/L (ref 19–32)
Calcium: 9.3 mg/dL (ref 8.4–10.5)
Chloride: 107 mEq/L (ref 96–112)
Creatinine, Ser: 1.24 mg/dL (ref 0.50–1.35)
GFR calc Af Amer: 70 mL/min — ABNORMAL LOW (ref >90)
GFR calc non Af Amer: 66 mL/min — ABNORMAL LOW (ref >90)
Glucose, Bld: 105 mg/dL — ABNORMAL HIGH (ref 70–99)
Sodium: 139 mEq/L (ref 135–145)
Total Bilirubin: 0.8 mg/dL (ref 0.3–1.2)
Total Protein: 7.2 g/dL (ref 6.0–8.3)

## 2011-09-06 LAB — GLUCOSE, CAPILLARY
Glucose-Capillary: 107 mg/dL — ABNORMAL HIGH (ref 70–99)
Glucose-Capillary: 95 mg/dL (ref 70–99)
Glucose-Capillary: 85 mg/dL (ref 70–99)

## 2011-09-06 LAB — CBC
Hemoglobin: 12.9 g/dL — ABNORMAL LOW (ref 13.0–17.0)
MCH: 30.6 pg (ref 26.0–34.0)
MCH: 30.7 pg (ref 26.0–34.0)
MCHC: 33.1 g/dL (ref 30.0–36.0)
Platelets: 408 10*3/uL — ABNORMAL HIGH (ref 150–400)
RBC: 3.71 MIL/uL — ABNORMAL LOW (ref 4.22–5.81)
RDW: 13.6 % (ref 11.5–15.5)
WBC: 14.6 10*3/uL — ABNORMAL HIGH (ref 4.0–10.5)

## 2011-09-06 SURGERY — AMPUTATION, FOOT, RAY
Anesthesia: General | Laterality: Left

## 2011-09-06 MED ORDER — VANCOMYCIN HCL IN DEXTROSE 1-5 GM/200ML-% IV SOLN
1000.0000 mg | Freq: Once | INTRAVENOUS | Status: AC
  Administered 2011-09-06: 1000 mg via INTRAVENOUS
  Filled 2011-09-06: qty 200

## 2011-09-06 MED ORDER — PROPOFOL 10 MG/ML IV BOLUS
INTRAVENOUS | Status: DC | PRN
  Administered 2011-09-06: 200 mg via INTRAVENOUS

## 2011-09-06 MED ORDER — SODIUM CHLORIDE 0.9 % IV SOLN: INTRAVENOUS | Status: DC

## 2011-09-06 MED ORDER — HYDROMORPHONE HCL PF 1 MG/ML IJ SOLN
0.5000 mg | INTRAMUSCULAR | Status: DC | PRN
  Administered 2011-09-06: 0.5 mg via INTRAVENOUS
  Filled 2011-09-06: qty 1

## 2011-09-06 MED ORDER — ONDANSETRON HCL 4 MG PO TABS
4.0000 mg | ORAL_TABLET | Freq: Four times a day (QID) | ORAL | Status: DC | PRN
  Administered 2011-09-07 – 2011-09-08 (×2): 4 mg via ORAL
  Filled 2011-09-06 (×2): qty 1

## 2011-09-06 MED ORDER — BACITRACIN ZINC 500 UNIT/GM EX OINT
TOPICAL_OINTMENT | CUTANEOUS | Status: DC | PRN
  Administered 2011-09-06: 1 via TOPICAL

## 2011-09-06 MED ORDER — ONDANSETRON HCL 4 MG/2ML IJ SOLN: 4.0000 mg | Freq: Four times a day (QID) | INTRAMUSCULAR | Status: DC | PRN

## 2011-09-06 MED ORDER — SODIUM CHLORIDE 0.9 % IV SOLN
INTRAVENOUS | Status: DC
  Administered 2011-09-06: 05:00:00 via INTRAVENOUS

## 2011-09-06 MED ORDER — LISINOPRIL 10 MG PO TABS
10.0000 mg | ORAL_TABLET | Freq: Every day | ORAL | Status: DC
  Administered 2011-09-06 – 2011-09-10 (×5): 10 mg via ORAL
  Filled 2011-09-06 (×5): qty 1

## 2011-09-06 MED ORDER — ACETAMINOPHEN 325 MG PO TABS
650.0000 mg | ORAL_TABLET | Freq: Four times a day (QID) | ORAL | Status: DC | PRN
  Administered 2011-09-08: 325 mg via ORAL
  Filled 2011-09-06: qty 1

## 2011-09-06 MED ORDER — PROMETHAZINE HCL 25 MG/ML IJ SOLN
6.2500 mg | INTRAMUSCULAR | Status: DC | PRN
  Administered 2011-09-08: 12.5 mg via INTRAVENOUS
  Filled 2011-09-06: qty 1

## 2011-09-06 MED ORDER — HYDROCODONE-ACETAMINOPHEN 5-325 MG PO TABS
1.0000 | ORAL_TABLET | ORAL | Status: DC | PRN
  Administered 2011-09-06 – 2011-09-07 (×6): 2 via ORAL
  Administered 2011-09-08 – 2011-09-09 (×2): 1 via ORAL
  Filled 2011-09-06: qty 1
  Filled 2011-09-06 (×3): qty 2
  Filled 2011-09-06: qty 1
  Filled 2011-09-06 (×3): qty 2

## 2011-09-06 MED ORDER — SODIUM CHLORIDE 0.9 % IR SOLN
Status: DC | PRN
  Administered 2011-09-06: 3000 mL

## 2011-09-06 MED ORDER — DEXTROSE 5 % IV SOLN
INTRAVENOUS | Status: DC | PRN
  Administered 2011-09-06: 18:00:00 via INTRAVENOUS

## 2011-09-06 MED ORDER — HEPARIN SODIUM (PORCINE) 5000 UNIT/ML IJ SOLN
5000.0000 [IU] | Freq: Three times a day (TID) | INTRAMUSCULAR | Status: DC
  Filled 2011-09-06 (×2): qty 1

## 2011-09-06 MED ORDER — POTASSIUM CHLORIDE 10 MEQ/100ML IV SOLN
10.0000 meq | INTRAVENOUS | Status: AC
  Administered 2011-09-06: 10 meq via INTRAVENOUS
  Filled 2011-09-06 (×2): qty 100

## 2011-09-06 MED ORDER — FENTANYL CITRATE 0.05 MG/ML IJ SOLN
INTRAMUSCULAR | Status: DC | PRN
  Administered 2011-09-06 (×3): 50 ug via INTRAVENOUS

## 2011-09-06 MED ORDER — VANCOMYCIN HCL 1000 MG IV SOLR
1250.0000 mg | Freq: Two times a day (BID) | INTRAVENOUS | Status: DC
  Administered 2011-09-06 – 2011-09-08 (×6): 1250 mg via INTRAVENOUS
  Filled 2011-09-06 (×8): qty 1250

## 2011-09-06 MED ORDER — ASPIRIN EC 325 MG PO TBEC
325.0000 mg | DELAYED_RELEASE_TABLET | Freq: Every day | ORAL | Status: DC
  Administered 2011-09-06 – 2011-09-10 (×5): 325 mg via ORAL
  Filled 2011-09-06 (×5): qty 1

## 2011-09-06 MED ORDER — ACETAMINOPHEN 650 MG RE SUPP: 650.0000 mg | Freq: Four times a day (QID) | RECTAL | Status: DC | PRN

## 2011-09-06 MED ORDER — FENTANYL CITRATE 0.05 MG/ML IJ SOLN
50.0000 ug | Freq: Once | INTRAMUSCULAR | Status: AC
  Administered 2011-09-06: 50 ug via INTRAVENOUS
  Filled 2011-09-06: qty 2

## 2011-09-06 MED ORDER — ONDANSETRON HCL 4 MG/2ML IJ SOLN
4.0000 mg | Freq: Once | INTRAMUSCULAR | Status: AC
Start: 2011-09-06 — End: 2011-09-06
  Administered 2011-09-06: 4 mg via INTRAVENOUS
  Filled 2011-09-06: qty 2

## 2011-09-06 MED ORDER — SODIUM CHLORIDE 0.9 % IV SOLN
INTRAVENOUS | Status: DC | PRN
  Administered 2011-09-06 (×2): via INTRAVENOUS

## 2011-09-06 MED ORDER — ONDANSETRON HCL 4 MG PO TABS: 4.0000 mg | ORAL_TABLET | Freq: Four times a day (QID) | ORAL | Status: DC | PRN

## 2011-09-06 MED ORDER — LORAZEPAM 2 MG/ML IJ SOLN: 1.0000 mg | Freq: Once | INTRAMUSCULAR | Status: AC | PRN

## 2011-09-06 MED ORDER — SODIUM CHLORIDE 0.9 % IV SOLN
INTRAVENOUS | Status: DC
  Administered 2011-09-06 – 2011-09-07 (×2): via INTRAVENOUS

## 2011-09-06 MED ORDER — HYDROMORPHONE HCL PF 1 MG/ML IJ SOLN
0.2500 mg | INTRAMUSCULAR | Status: DC | PRN
  Administered 2011-09-06 – 2011-09-07 (×3): 0.5 mg via INTRAVENOUS
  Filled 2011-09-06 (×2): qty 1

## 2011-09-06 MED ORDER — PIPERACILLIN-TAZOBACTAM 3.375 G IVPB
3.3750 g | Freq: Three times a day (TID) | INTRAVENOUS | Status: DC
  Administered 2011-09-06 – 2011-09-09 (×10): 3.375 g via INTRAVENOUS
  Filled 2011-09-06 (×13): qty 50

## 2011-09-06 MED ORDER — MIDAZOLAM HCL 5 MG/5ML IJ SOLN
INTRAMUSCULAR | Status: DC | PRN
  Administered 2011-09-06: 2 mg via INTRAVENOUS

## 2011-09-06 MED ORDER — DOCUSATE SODIUM 100 MG PO CAPS
100.0000 mg | ORAL_CAPSULE | Freq: Two times a day (BID) | ORAL | Status: DC
  Administered 2011-09-06 – 2011-09-10 (×8): 100 mg via ORAL
  Filled 2011-09-06 (×10): qty 1

## 2011-09-06 MED ORDER — SENNA 8.6 MG PO TABS
1.0000 | ORAL_TABLET | Freq: Two times a day (BID) | ORAL | Status: DC
  Administered 2011-09-06 – 2011-09-10 (×8): 8.6 mg via ORAL
  Filled 2011-09-06 (×9): qty 1

## 2011-09-06 SURGICAL SUPPLY — 37 items
BNDG COHESIVE 4X5 TAN STRL (GAUZE/BANDAGES/DRESSINGS) ×2 IMPLANT
BNDG COHESIVE 6X5 TAN STRL LF (GAUZE/BANDAGES/DRESSINGS) IMPLANT
BNDG ESMARK 4X9 LF (GAUZE/BANDAGES/DRESSINGS) ×2 IMPLANT
CLOTH BEACON ORANGE TIMEOUT ST (SAFETY) ×2 IMPLANT
CUFF TOURNIQUET SINGLE 34IN LL (TOURNIQUET CUFF) IMPLANT
CUFF TOURNIQUET SINGLE 44IN (TOURNIQUET CUFF) IMPLANT
DRAPE U-SHAPE 47X51 STRL (DRAPES) ×4 IMPLANT
DRSG ADAPTIC 3X8 NADH LF (GAUZE/BANDAGES/DRESSINGS) ×2 IMPLANT
DURAPREP 26ML APPLICATOR (WOUND CARE) IMPLANT
ELECT REM PT RETURN 9FT ADLT (ELECTROSURGICAL) ×2
ELECTRODE REM PT RTRN 9FT ADLT (ELECTROSURGICAL) ×1 IMPLANT
GAUZE SPONGE 4X4 12PLY STRL LF (GAUZE/BANDAGES/DRESSINGS) ×2 IMPLANT
GLOVE BIO SURGEON STRL SZ8 (GLOVE) ×4 IMPLANT
GLOVE BIOGEL PI IND STRL 8 (GLOVE) ×1 IMPLANT
GLOVE BIOGEL PI INDICATOR 8 (GLOVE) ×1
GOWN PREVENTION PLUS XLARGE (GOWN DISPOSABLE) ×2 IMPLANT
GOWN STRL NON-REIN LRG LVL3 (GOWN DISPOSABLE) ×2 IMPLANT
KIT BASIN OR (CUSTOM PROCEDURE TRAY) ×2 IMPLANT
KIT ROOM TURNOVER OR (KITS) ×2 IMPLANT
MANIFOLD NEPTUNE II (INSTRUMENTS) ×2 IMPLANT
NS IRRIG 1000ML POUR BTL (IV SOLUTION) ×2 IMPLANT
PACK ORTHO EXTREMITY (CUSTOM PROCEDURE TRAY) ×2 IMPLANT
PAD ARMBOARD 7.5X6 YLW CONV (MISCELLANEOUS) ×4 IMPLANT
PAD CAST 4YDX4 CTTN HI CHSV (CAST SUPPLIES) ×1 IMPLANT
PADDING CAST COTTON 4X4 STRL (CAST SUPPLIES) ×1
PADDING WEBRIL 4 STERILE (GAUZE/BANDAGES/DRESSINGS) ×2 IMPLANT
SPONGE GAUZE 4X4 12PLY (GAUZE/BANDAGES/DRESSINGS) ×2 IMPLANT
SPONGE LAP 18X18 X RAY DECT (DISPOSABLE) ×2 IMPLANT
STAPLER VISISTAT 35W (STAPLE) IMPLANT
STOCKINETTE IMPERVIOUS LG (DRAPES) IMPLANT
SUCTION FRAZIER TIP 10 FR DISP (SUCTIONS) ×2 IMPLANT
SUT ETHILON 2 0 PSLX (SUTURE) ×4 IMPLANT
TOWEL OR 17X24 6PK STRL BLUE (TOWEL DISPOSABLE) ×2 IMPLANT
TOWEL OR 17X26 10 PK STRL BLUE (TOWEL DISPOSABLE) ×2 IMPLANT
TUBE CONNECTING 12X1/4 (SUCTIONS) ×2 IMPLANT
UNDERPAD 30X30 INCONTINENT (UNDERPADS AND DIAPERS) ×2 IMPLANT
WATER STERILE IRR 1000ML POUR (IV SOLUTION) ×2 IMPLANT

## 2011-09-06 NOTE — Progress Notes (Signed)
[name] CSN:[id number]:[id number] is a 51 y.o. male,  Outpatient Primary MD for the patient is No primary provider on file.  Chief Complaint  Patient presents with  . Foot Pain  . Wound Infection        Subjective:   [name] today has, No headache, No chest pain, No abdominal pain - No Nausea, No new weakness tingling or numbness, No Cough - SOB.     Objective:   Filed Vitals:   09/06/11 0038 09/06/11 0236 09/06/11 0410 09/06/11 1403  BP: 160/87 161/90 168/87 162/85  Pulse: 75 83 77 74  Temp: 98.3 F (36.8 C) 97.6 F (36.4 C) 97.5 F (36.4 C)   TempSrc: Oral Oral Oral   Resp: 20 16 18 18   Height:   5\' 11"  (1.803 m)   Weight:   90.719 kg (200 lb)   SpO2: 98% 97% 99% 97%    Wt Readings from Last 3 Encounters:  09/06/11 90.719 kg (200 lb)  08/24/11 87.998 kg (194 lb)     Intake/Output Summary (Last 24 hours) at 09/06/11 1508 Last data filed at 09/06/11 1300  Gross per 24 hour  Intake    300 ml  Output    300 ml  Net      0 ml    Exam Awake Alert, Oriented *3, No new F.N deficits, Normal affect [place].AT,PERRAL Supple Neck,No JVD, No cervical lymphadenopathy appriciated.  Symmetrical Chest wall movement, Good air movement bilaterally, CTAB RRR,No Gallops,Rubs or new Murmurs, No Parasternal Heave +ve B.Sounds, Abd Soft, Non tender, No organomegaly appriciated, No rebound -guarding or rigidity. No Cyanosis, Clubbing or edema, No new Rash or bruise, L leg swollen, L toe Lat aspect has a necrotic ulcer, small discharge  Data Review  CBC  Lab 09/06/11 0505 09/05/11 2357  WBC 14.6* 15.3*  HGB 11.4* 12.9*  HCT 34.3* 39.0  PLT 408* 453*  MCV 92.5 92.4  MCH 30.7 30.6  MCHC 33.2 33.1  RDW 13.5 13.6  LYMPHSABS -- --  MONOABS -- --  EOSABS -- --  BASOSABS -- --  BANDABS -- --    Chemistries   Lab 09/06/11 0505 09/05/11 2357  NA 140 139  K 3.2* 3.4*  CL 107 105  CO2 25 26  GLUCOSE 108* 105*  BUN 17 19  CREATININE 1.24 1.34  CALCIUM 8.4 9.3  MG --  --  AST 15 18  ALT 12 15  ALKPHOS 85 98  BILITOT 0.8 0.7   ------------------------------------------------------------------------------------------------------------------ estimated creatinine clearance is 82.2 ml/min (by C-G formula based on Cr of 1.24). ------------------------------------------------------------------------------------------------------------------ No results found for this basename: HGBA1C:2 in the last 72 hours ------------------------------------------------------------------------------------------------------------------ No results found for this basename: CHOL:2,HDL:2,LDLCALC:2,TRIG:2,CHOLHDL:2,LDLDIRECT:2 in the last 72 hours ------------------------------------------------------------------------------------------------------------------ No results found for this basename: TSH,T4TOTAL,FREET3,T3FREE,THYROIDAB in the last 72 hours ------------------------------------------------------------------------------------------------------------------ No results found for this basename: VITAMINB12:2,FOLATE:2,FERRITIN:2,TIBC:2,IRON:2,RETICCTPCT:2 in the last 72 hours  Coagulation profile No results found for this basename: INR:5,PROTIME:5 in the last 168 hours  No results found for this basename: DDIMER:2 in the last 72 hours  Cardiac Enzymes No results found for this basename: CK:3,CKMB:3,TROPONINI:3,MYOGLOBIN:3 in the last 168 hours ------------------------------------------------------------------------------------------------------------------ No components found with this basename: POCBNP:3  Micro Results No results found for this or any previous visit (from the past 240 hour(s)).  Radiology Reports Dg Foot Complete Left  09/06/2011  *RADIOLOGY REPORT*  Clinical Data: Diabetic ulcer.  Infection in the great toe after removal of the piece of metal.  LEFT FOOT - COMPLETE 3+ VIEW  Comparison:  08/24/2011  Findings: Since the previous study, the radiopaque foreign  body has been removed from the distal aspect of the left first toe.  There is now obvious soft tissue defect in the lateral aspect of the distal first toe.  There is underlying cortical loss and lucency in the distal phalangeal tuft and along the medial edge of the distal phalanx.  Changes to suggest osteomyelitis.  No acute fractures or subluxations.  Vascular calcifications.  A plantar calcaneal spurs.  IMPRESSION: Interval development of soft tissue defect in the first toe with interval cortical erosion of the distal phalanx consistent with osteomyelitis.  Original Report Authenticated By: Marlon Pel, M.D.   Dg Foot Complete Left  08/24/2011  *RADIOLOGY REPORT*  Clinical Data: Pain, spiraling, infection of great toe, metal sliver in the left great toe  LEFT FOOT - COMPLETE 3+ VIEW  Comparison: None.  Findings: No fracture or dislocation is seen.  The joint spaces are preserved.  14 mm radiopaque foreign body along the plantar aspect of the first distal phalanx.  Associated soft tissue swelling of the first digit.  Vascular calcifications.  Plantar and posterior calcaneal enthesopathy.  IMPRESSION: No fracture or dislocation is seen.  14 mm radiopaque foreign body along the plantar aspect of the first distal phalanx.  Associated soft tissue swelling of the first digit.  Original Report Authenticated By: Charline Bills, M.D.    Scheduled Meds:   . fentaNYL  50 mcg Intravenous Once  . lisinopril  10 mg Oral Daily  . ondansetron (ZOFRAN) IV  4 mg Intravenous Once  . piperacillin-tazobactam (ZOSYN)  IV  3.375 g Intravenous Q8H  . potassium chloride  10 mEq Intravenous Q1 Hr x 2  . vancomycin  1,250 mg Intravenous Q12H  . vancomycin  1,000 mg Intravenous Once   Continuous Infusions:   . sodium chloride    . DISCONTD: sodium chloride 125 mL/hr at 09/06/11 0023  . DISCONTD: sodium chloride 125 mL/hr at 09/06/11 0507   PRN Meds:.acetaminophen, acetaminophen, HYDROmorphone, ondansetron  (ZOFRAN) IV, ondansetron  Assessment & Plan   #1. Left foot cellulitis with osteomyelitis of the distal phalanx of the great toe - patient still has significant swelling of his left foot with an ulcer on the left great toe probably from the foreign body being removed Dr. Laverta [place] office. This time patient will be kept on vancomycin and Zosyn. Patient is expected to go to OR later today for further debridement. Hence patient be kept n.p.o. Have called Dr Hewitt's office again to remind, will get [place] too for Leg swelling which most likely is from the infection.  #2. Recently diagnosed diabetes mellitus type 2 - patient will be placed on sliding scale coverage. As he is n.p.o. CBG will be checked every 4 hourly which has to be changed to a.c. and at bedtime when he starts diet. Check A1c, on no home meds for DM.    #3. Recently diagnosed and deficiency anemia - will need outpatient followup with gastroenterologist for colonoscopy.    #4. History of hypertension - continue present medications.   DVT Prophylaxis Heparin  See all Orders from today for further details     Leroy Sea M.D on 09/06/2011 at 3:08 PM  [facility] Office  [phone]

## 2011-09-06 NOTE — Brief Op Note (Signed)
09/05/2011 - 09/06/2011  6:49 PM  PATIENT:  [name]  51 y.o. 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  Right hallux amputation  SURGEON:  Toni Arthurs, MD  ASSISTANT: n/a  ANESTHESIA:   General  EBL:  minimal  TOURNIQUET:  approx 20 min with an ankle esmarch  Specimens:  Deep tissue to micro for culture, toe to pathology  COMPLICATIONS:  None apparent  DISPOSITION:  Extubated, awake and stable to recovery.  DICTATION ID:  [id number]

## 2011-09-06 NOTE — Consult Note (Signed)
Reason for Consult:L hallux osteomyelitis Referring Physician: Dr. Ellwood Sayers is an 51 y.o. male.  HPI: 51 y/o male with L hallux wound since stepping on a foreign body 2-3 weeks ago.  Doesn't recall any wound but had swelling and redness necessitating admission.  He had iv ABX and cellulitis resolved.  Pt didn't comply with abx therapy post discharge and presented to my clinic yesterday with purulent drainage.  I removed the foreign body and opened an abscess. Pt was admitted and started on vanc and zosyn. Xray now reveals osteomyelitis of distal phalanx of L hallux.  Past Medical History  Diagnosis Date  . Diabetes mellitus     New onset    Past Surgical History  Procedure Date  . Leg surgery   . Knee surgery     Family History  Problem Relation Age of Onset  . Adopted: Yes    Social History:  reports that he has never smoked. He does not have any smokeless tobacco history on file. He reports that he does not drink alcohol or use illicit drugs.  Allergies: No Known Allergies  Medications: I have reviewed the patient's current medications.  Results for orders placed during the hospital encounter of 09/05/11 (from the past 48 hour(s))  CBC     Status: Abnormal   Collection Time   09/05/11 11:57 PM      Component Value Range Comment   WBC 15.3 (*) 4.0 - 10.5 (K/uL)    RBC 4.22  4.22 - 5.81 (MIL/uL)    Hemoglobin 12.9 (*) 13.0 - 17.0 (g/dL)    HCT 16.1  09.6 - 04.5 (%)    MCV 92.4  78.0 - 100.0 (fL)    MCH 30.6  26.0 - 34.0 (pg)    MCHC 33.1  30.0 - 36.0 (g/dL)    RDW 40.9  81.1 - 91.4 (%)    Platelets 453 (*) 150 - 400 (K/uL)   COMPREHENSIVE METABOLIC PANEL     Status: Abnormal   Collection Time   09/05/11 11:57 PM      Component Value Range Comment   Sodium 139  135 - 145 (mEq/L)    Potassium 3.4 (*) 3.5 - 5.1 (mEq/L)    Chloride 105  96 - 112 (mEq/L)    CO2 26  19 - 32 (mEq/L)    Glucose, Bld 105 (*) 70 - 99 (mg/dL)    BUN 19  6 - 23 (mg/dL)    Creatinine,  Ser 7.82  0.50 - 1.35 (mg/dL)    Calcium 9.3  8.4 - 10.5 (mg/dL)    Total Protein 7.2  6.0 - 8.3 (g/dL)    Albumin 2.5 (*) 3.5 - 5.2 (g/dL)    AST 18  0 - 37 (U/L)    ALT 15  0 - 53 (U/L)    Alkaline Phosphatase 98  39 - 117 (U/L)    Total Bilirubin 0.7  0.3 - 1.2 (mg/dL)    GFR calc non Af Amer 60 (*) >90 (mL/min)    GFR calc Af Amer 70 (*) >90 (mL/min)   GLUCOSE, CAPILLARY     Status: Abnormal   Collection Time   09/06/11  3:54 AM      Component Value Range Comment   Glucose-Capillary 107 (*) 70 - 99 (mg/dL)   COMPREHENSIVE METABOLIC PANEL     Status: Abnormal   Collection Time   09/06/11  5:05 AM      Component Value Range Comment   Sodium  140  135 - 145 (mEq/L)    Potassium 3.2 (*) 3.5 - 5.1 (mEq/L)    Chloride 107  96 - 112 (mEq/L)    CO2 25  19 - 32 (mEq/L)    Glucose, Bld 108 (*) 70 - 99 (mg/dL)    BUN 17  6 - 23 (mg/dL)    Creatinine, Ser 7.82  0.50 - 1.35 (mg/dL)    Calcium 8.4  8.4 - 10.5 (mg/dL)    Total Protein 6.3  6.0 - 8.3 (g/dL)    Albumin 2.0 (*) 3.5 - 5.2 (g/dL)    AST 15  0 - 37 (U/L)    ALT 12  0 - 53 (U/L)    Alkaline Phosphatase 85  39 - 117 (U/L)    Total Bilirubin 0.8  0.3 - 1.2 (mg/dL)    GFR calc non Af Amer 66 (*) >90 (mL/min)    GFR calc Af Amer 77 (*) >90 (mL/min)   CBC     Status: Abnormal   Collection Time   09/06/11  5:05 AM      Component Value Range Comment   WBC 14.6 (*) 4.0 - 10.5 (K/uL)    RBC 3.71 (*) 4.22 - 5.81 (MIL/uL)    Hemoglobin 11.4 (*) 13.0 - 17.0 (g/dL)    HCT 95.6 (*) 21.3 - 52.0 (%)    MCV 92.5  78.0 - 100.0 (fL)    MCH 30.7  26.0 - 34.0 (pg)    MCHC 33.2  30.0 - 36.0 (g/dL)    RDW 08.6  57.8 - 46.9 (%)    Platelets 408 (*) 150 - 400 (K/uL)   GLUCOSE, CAPILLARY     Status: Normal   Collection Time   09/06/11  7:38 AM      Component Value Range Comment   Glucose-Capillary 92  70 - 99 (mg/dL)   GLUCOSE, CAPILLARY     Status: Normal   Collection Time   09/06/11 10:44 AM      Component Value Range Comment   Glucose-Capillary  92  70 - 99 (mg/dL)    Comment 1 Documented in Chart      Comment 2 Notify RN     GLUCOSE, CAPILLARY     Status: Normal   Collection Time   09/06/11  4:05 PM      Component Value Range Comment   Glucose-Capillary 95  70 - 99 (mg/dL)     Dg Foot Complete Left  09/06/2011  *RADIOLOGY REPORT*  Clinical Data: Diabetic ulcer.  Infection in the great toe after removal of the piece of metal.  LEFT FOOT - COMPLETE 3+ VIEW  Comparison: 08/24/2011  Findings: Since the previous study, the radiopaque foreign body has been removed from the distal aspect of the left first toe.  There is now obvious soft tissue defect in the lateral aspect of the distal first toe.  There is underlying cortical loss and lucency in the distal phalangeal tuft and along the medial edge of the distal phalanx.  Changes to suggest osteomyelitis.  No acute fractures or subluxations.  Vascular calcifications.  A plantar calcaneal spurs.  IMPRESSION: Interval development of soft tissue defect in the first toe with interval cortical erosion of the distal phalanx consistent with osteomyelitis.  Original Report Authenticated By: Marlon Pel, M.D.    ROS  No recent f/n/v/c/wt loss. Blood pressure 162/85, pulse 74, temperature 97.5 F (36.4 C), temperature source Oral, resp. rate 18, height 5\' 11"  (1.803 m), weight 90.719 kg (  200 lb), SpO2 97.00%. Physical Exam  WN WD [place] in NAD.  A and O x 4.  Mood and affect normal.  EOMI.  Respirations unlabored.  L hallux with necrotic tisue medially.  Diminished sens to LT.  Palpable pulses.  No lymphadenopahty.  Local cellulitis at toe but skin healthy and intact over remainder of foot.  Assessment/Plan: L hallux osteomyelitis with diabetic ulcer - to OR today for L hallux amputation.  The risks and benefits of the alternative treatment options have been discussed in detail.  The patient wishes to proceed with surgery and specifically understands risks of bleeding, infection, nerve damage, blood  clots, need for additional surgery, amputation and death.   Toni Arthurs 09-24-11, 4:29 PM

## 2011-09-06 NOTE — Transfer of Care (Signed)
Immediate Anesthesia Transfer of Care Note  Patient: [name]  Procedure(s) Performed:  AMPUTATION RAY - Left Hallux Amputation  Patient Location: PACU  Anesthesia Type: General  Level of Consciousness: awake and alert   Airway & Oxygen Therapy: Patient connected to nasal cannula oxygen  Post-op Assessment: Report given to PACU RN, Post -op Vital signs reviewed and stable and Patient moving all extremities X 4  Post vital signs: Reviewed and stable  Complications: No apparent anesthesia complications

## 2011-09-06 NOTE — H&P (Signed)
Valerio Pinard is an 51 y.o. male.   PCP - None Orthopedics - Dr.Hewitt Chief Complaint: Left foot swelling. HPI: 51 year old male who was admitted to the hospital and discharged 2 weeks ago after being treated for left foot cellulitis with a foreign body in the great toe. At that time orthopedic doctors advise follow up as outpatient after course of antibiotics for removal of foreign body as outpatient. Patient went to Dr.Hewitt's office yesterday and had his foreign body removed from the left great toe. But was told to come to the ER directly because of worsening cellulitis and was planned to take to the OR. Patient presently still has some pain in his left foot which is controlled by pain medications but denies any fever chills. He states his foot got better after his discharge last time but after a few days he started getting worse. X-ray of the left foot done today shows osteomyelitis of the distal phalanx of the great toe of left foot.  Past Medical History  Diagnosis Date  . Diabetes mellitus     New onset    Past Surgical History  Procedure Date  . Leg surgery   . Knee surgery     Family History  Problem Relation Age of Onset  . Adopted: Yes   Social History:  reports that he has never smoked. He does not have any smokeless tobacco history on file. He reports that he does not drink alcohol or use illicit drugs.  Allergies: No Known Allergies  Medications Prior to Admission  Medication Dose Route Frequency Provider Last Rate Last Dose  . 0.9 %  sodium chloride infusion   Intravenous Continuous Sunnie Nielsen, MD 125 mL/hr at 09/06/11 0023    . fentaNYL (SUBLIMAZE) injection 50 mcg  50 mcg Intravenous Once Sunnie Nielsen, MD   50 mcg at 09/06/11 0219  . ondansetron (ZOFRAN) injection 4 mg  4 mg Intravenous Once Sunnie Nielsen, MD   4 mg at 09/06/11 0217  . vancomycin (VANCOCIN) IVPB 1000 mg/200 mL premix  1,000 mg Intravenous Once Sunnie Nielsen, MD   1,000 mg at 09/06/11 0238   Medications  Prior to Admission  Medication Sig Dispense Refill  . oxyCODONE-acetaminophen (PERCOCET) 5-325 MG per tablet Take 1 tablet by mouth every 6 (six) hours as needed. For pain         Results for orders placed during the hospital encounter of 09/05/11 (from the past 48 hour(s))  CBC     Status: Abnormal   Collection Time   09/05/11 11:57 PM      Component Value Range Comment   WBC 15.3 (*) 4.0 - 10.5 (K/uL)    RBC 4.22  4.22 - 5.81 (MIL/uL)    Hemoglobin 12.9 (*) 13.0 - 17.0 (g/dL)    HCT 69.6  29.5 - 28.4 (%)    MCV 92.4  78.0 - 100.0 (fL)    MCH 30.6  26.0 - 34.0 (pg)    MCHC 33.1  30.0 - 36.0 (g/dL)    RDW 13.2  44.0 - 10.2 (%)    Platelets 453 (*) 150 - 400 (K/uL)   COMPREHENSIVE METABOLIC PANEL     Status: Abnormal   Collection Time   09/05/11 11:57 PM      Component Value Range Comment   Sodium 139  135 - 145 (mEq/L)    Potassium 3.4 (*) 3.5 - 5.1 (mEq/L)    Chloride 105  96 - 112 (mEq/L)    CO2 26  19 -  32 (mEq/L)    Glucose, Bld 105 (*) 70 - 99 (mg/dL)    BUN 19  6 - 23 (mg/dL)    Creatinine, Ser 4.09  0.50 - 1.35 (mg/dL)    Calcium 9.3  8.4 - 10.5 (mg/dL)    Total Protein 7.2  6.0 - 8.3 (g/dL)    Albumin 2.5 (*) 3.5 - 5.2 (g/dL)    AST 18  0 - 37 (U/L)    ALT 15  0 - 53 (U/L)    Alkaline Phosphatase 98  39 - 117 (U/L)    Total Bilirubin 0.7  0.3 - 1.2 (mg/dL)    GFR calc non Af Amer 60 (*) >90 (mL/min)    GFR calc Af Amer 70 (*) >90 (mL/min)    Dg Foot Complete Left  09/06/2011  *RADIOLOGY REPORT*  Clinical Data: Diabetic ulcer.  Infection in the great toe after removal of the piece of metal.  LEFT FOOT - COMPLETE 3+ VIEW  Comparison: 08/24/2011  Findings: Since the previous study, the radiopaque foreign body has been removed from the distal aspect of the left first toe.  There is now obvious soft tissue defect in the lateral aspect of the distal first toe.  There is underlying cortical loss and lucency in the distal phalangeal tuft and along the medial edge of the distal  phalanx.  Changes to suggest osteomyelitis.  No acute fractures or subluxations.  Vascular calcifications.  A plantar calcaneal spurs.  IMPRESSION: Interval development of soft tissue defect in the first toe with interval cortical erosion of the distal phalanx consistent with osteomyelitis.  Original Report Authenticated By: Marlon Pel, M.D.    Review of Systems  Constitutional: Negative.   HENT: Negative.   Eyes: Negative.   Respiratory: Negative.   Cardiovascular: Negative.   Gastrointestinal: Negative.   Genitourinary: Negative.   Musculoskeletal:       SWELLING OF THE LEFT FOOT.  Skin: Negative.   Neurological: Negative.   Endo/Heme/Allergies: Negative.   Psychiatric/Behavioral: Negative.     Blood pressure 161/90, pulse 83, temperature 97.6 F (36.4 C), temperature source Oral, resp. rate 16, SpO2 97.00%. Physical Exam  Constitutional: He is oriented to person, place, and time. He appears well-developed and well-nourished. No distress.  HENT:  Head: Normocephalic and atraumatic.  Right Ear: External ear normal.  Left Ear: External ear normal.  Nose: Nose normal.  Mouth/Throat: Oropharynx is clear and moist. No oropharyngeal exudate.  Eyes: Conjunctivae and EOM are normal. Pupils are equal, round, and reactive to light. Right eye exhibits no discharge. Left eye exhibits no discharge. No scleral icterus.  Neck: Normal range of motion. Neck supple.  Cardiovascular: Normal rate, regular rhythm and normal heart sounds.   Respiratory: Effort normal and breath sounds normal. No respiratory distress. He has no wheezes. He has no rales.  GI: Soft. Bowel sounds are normal. He exhibits no distension. There is no tenderness. There is no rebound.  Musculoskeletal:       Left foot is swollen upto the ankle. The left great toe has ulcer with black gangrenous looking walls.  Neurological: He is alert and oriented to person, place, and time. He has normal reflexes. No cranial nerve  deficit. Coordination normal.  Skin: Skin is warm and dry. He is not diaphoretic.  Psychiatric: His behavior is normal.     Assessment/Plan #1. Left foot cellulitis with osteomyelitis of the distal phalanx of the great toe - patient still has significant swelling of his left foot with  an ulcer on the left great toe probably from the foreign body being removed Dr. Laverta Baltimore office. This time patient will be kept on vancomycin and Zosyn. Patient is expected to go to OR later today for further debridement. Hence patient be kept n.p.o. #2. Recently diagnosed diabetes mellitus type 2 - patient will be placed on sliding scale coverage. As he is n.p.o. CBG will be checked every 4 hourly which has to be changed to a.c. and at bedtime when he starts diet. #3. Recently diagnosed and deficiency anemia - will need outpatient followup with gastroenterologist for colonoscopy. #4. History of hypertension - continue present medications.  CODE STATUS - full code.  Monseratt Ledin N. 09/06/2011, 3:29 AM

## 2011-09-06 NOTE — ED Provider Notes (Signed)
History     CSN: [id number]  Arrival date & time 09/05/11  8119   First MD Initiated Contact with Patient 09/05/11 2058      Chief Complaint  Patient presents with  . Foot Pain  . Wound Infection    (Consider location/radiation/quality/duration/timing/severity/associated sxs/prior treatment) The history is provided by the patient.   patient has a diabetic wound to his left great toe. He is been taking antibiotics and states his diabetes has been out of control. For persistent wound he was seen in followup by orthopedics Dr. Victorino Dike today in the clinic. At that time he was sent to the emergency department to be admitted. Reportedly, Dr. Victorino Dike spoke with the previous ED attending and recommended the patient be admitted to the medical service and that he is scheduled to have operation today. Patient initially came emergency department and then left and now returns tonight. No fevers. No new trauma. No vomiting. Patient does complain of pain and is requesting medications. Moderate in severity. No known aggravating or alleviating factors. No radiation of pain described as sharp in quality  Past Medical History  Diagnosis Date  . Diabetes mellitus     New onset    Past Surgical History  Procedure Date  . Leg surgery   . Knee surgery     Family History  Problem Relation Age of Onset  . Adopted: Yes    History  Substance Use Topics  . Smoking status: Never Smoker   . Smokeless tobacco: Not on file  . Alcohol Use: No      Review of Systems  Constitutional: Negative for fever and chills.  HENT: Negative for neck pain and neck stiffness.   Eyes: Negative for pain.  Respiratory: Negative for shortness of breath.   Cardiovascular: Negative for chest pain and palpitations.  Gastrointestinal: Negative for abdominal pain.  Genitourinary: Negative for dysuria.  Musculoskeletal: Negative for back pain.  Skin: Positive for wound. Negative for rash.  Neurological: Negative for  headaches.  All other systems reviewed and are negative.    Allergies  Review of patient's allergies indicates no known allergies.  Home Medications   Current Outpatient Rx  Name Route Sig Dispense Refill  . OXYCODONE-ACETAMINOPHEN 5-325 MG PO TABS Oral Take 1 tablet by mouth every 6 (six) hours as needed. For pain       BP 160/87  Pulse 75  Temp(Src) 98.3 F (36.8 C) (Oral)  Resp 20  SpO2 98%  Physical Exam  Constitutional: He is oriented to person, place, and time. He appears well-developed and well-nourished.  HENT:  Head: Normocephalic and atraumatic.  Eyes: Conjunctivae and EOM are normal. Pupils are equal, round, and reactive to light.  Neck: Trachea normal. Neck supple. No thyromegaly present.  Cardiovascular: Normal rate, regular rhythm, S1 normal, S2 normal and normal pulses.     No systolic murmur is present   No diastolic murmur is present  Pulses:      Radial pulses are 2+ on the right side, and 2+ on the left side.  Pulmonary/Chest: Effort normal and breath sounds normal. He has no wheezes. He has no rhonchi. He has no rales. He exhibits no tenderness.  Abdominal: Soft. Normal appearance and bowel sounds are normal. There is no tenderness. There is no CVA tenderness and negative Murphy's sign.  Musculoskeletal:       Left lower extremity: Open diabetic foot ulceration over the great toe. No streaking lymphangitis or lymphadenopathy. Wound is tender to palpation with chronic changes.  No ankle swelling.  Neurological: He is alert and oriented to person, place, and time. He has normal strength. No cranial nerve deficit or sensory deficit. GCS eye subscore is 4. GCS verbal subscore is 5. GCS motor subscore is 6.  Skin: Skin is warm and dry. No rash noted. He is not diaphoretic.  Psychiatric: His speech is normal.       Cooperative and appropriate    ED Course  Procedures (including critical care time)  Labs Reviewed  CBC - Abnormal; Notable for the following:     WBC 15.3 (*)    Hemoglobin 12.9 (*)    Platelets 453 (*)    All other components within normal limits  COMPREHENSIVE METABOLIC PANEL - Abnormal; Notable for the following:    Potassium 3.4 (*)    Glucose, Bld 105 (*)    Albumin 2.5 (*)    GFR calc non Af Amer 60 (*)    GFR calc Af Amer 70 (*)    All other components within normal limits   Pain control IV narcotics. IV fluids. N.p.o. medicine consult as above for admit. Case discussed with triad hospitalist who agrees to admission   MDM   Diabetic left foot ulcer. IV antibiotics and medical admission.        Sunnie Nielsen, MD 09/06/11 [phone]

## 2011-09-06 NOTE — Anesthesia Postprocedure Evaluation (Signed)
Anesthesia Post Note  Patient: [name]  Procedure(s) Performed:  AMPUTATION RAY - Left Hallux Amputation  Anesthesia type: general  Patient location: PACU  Post pain: Pain level controlled  Post assessment: Patient's Cardiovascular Status Stable  Last Vitals:  Filed Vitals:   09/06/11 1945  BP:   Pulse: 74  Temp:   Resp: 11    Post vital signs: Reviewed and stable  Level of consciousness: sedated  Complications: No apparent anesthesia complications

## 2011-09-06 NOTE — ED Notes (Signed)
Patient returned from X-ray 

## 2011-09-06 NOTE — Preoperative (Signed)
Beta Blockers   Reason not to administer Beta Blockers:Not Applicable 

## 2011-09-06 NOTE — Progress Notes (Signed)
UR chart review completed.  

## 2011-09-06 NOTE — ED Notes (Signed)
Patient transported to X-ray 

## 2011-09-06 NOTE — Progress Notes (Signed)
ANTIBIOTIC CONSULT NOTE - INITIAL  Pharmacy Consult for vancomycin and zosyn  Indication: osteomyelitis/cellulitis of left foot/great toe  No Known Allergies  Patient Measurements:   Adjusted Body Weight:   Vital Signs: Temp: 97.6 F (36.4 C) (01/03 0236) Temp src: Oral (01/03 0236) BP: 161/90 mmHg (01/03 0236) Pulse Rate: 83  (01/03 0236) Intake/Output from previous day:   Intake/Output from this shift:    Labs:  Basename 09/05/11 2357  WBC 15.3*  HGB 12.9*  PLT 453*  LABCREA --  CREATININE 1.34   The CrCl is unknown because both a height and weight (above a minimum accepted value) are required for this calculation. No results found for this basename: VANCOTROUGH:2,VANCOPEAK:2,VANCORANDOM:2,GENTTROUGH:2,GENTPEAK:2,GENTRANDOM:2,TOBRATROUGH:2,TOBRAPEAK:2,TOBRARND:2,AMIKACINPEAK:2,AMIKACINTROU:2,AMIKACIN:2, in the last 72 hours   Microbiology: Recent Results (from the past 720 hour(s))  CULTURE, BLOOD (ROUTINE X 2)     Status: Normal   Collection Time   08/24/11  1:25 AM      Component Value Range Status Comment   Specimen Description BLOOD LEFT ARM   Final    Special Requests BOTTLES DRAWN AEROBIC AND ANAEROBIC [facility]   Final    Setup Time [id number]   Final    Culture NO GROWTH 5 DAYS   Final    Report Status 08/30/2011 FINAL   Final   CULTURE, BLOOD (ROUTINE X 2)     Status: Normal   Collection Time   08/24/11  1:35 AM      Component Value Range Status Comment   Specimen Description BLOOD RIGHT HAND   Final    Special Requests BOTTLES DRAWN AEROBIC AND ANAEROBIC [facility]   Final    Setup Time [id number]   Final    Culture NO GROWTH 5 DAYS   Final    Report Status 08/30/2011 FINAL   Final     Medical History: Past Medical History  Diagnosis Date  . Diabetes mellitus     New onset    Medications:  Prescriptions prior to admission  Medication Sig Dispense Refill  . oxyCODONE-acetaminophen (PERCOCET) 5-325 MG per tablet Take 1 tablet by mouth  every 6 (six) hours as needed. For pain        Assessment: Osteomyelitis/cellulitis of left great toe/removal of foreign object from toe. Gangrenous borders. DM and HTN   Goal of Therapy: vancomycin 15-20 normal zosyn dose.    Plan: vancomycin 1250mg  q12h zosyn 3.375 q8h   Janice Coffin 09/06/2011,4:08 AM

## 2011-09-06 NOTE — Anesthesia Preprocedure Evaluation (Addendum)
Anesthesia Evaluation  Patient identified by MRN, date of birth, ID band Patient awake    Reviewed: Allergy & Precautions, H&P , NPO status , Patient's Chart, lab work & pertinent test results, reviewed documented beta blocker date and time   Airway Mallampati: II TM Distance: >3 FB Neck ROM: Full    Dental  (+) Teeth Intact and Dental Advisory Given   Pulmonary          Cardiovascular hypertension,     Neuro/Psych    GI/Hepatic   Endo/Other  Diabetes mellitus-Newly diagnosed DM  Renal/GU      Musculoskeletal   Abdominal (+) obese,   Peds  Hematology   Anesthesia Other Findings   Reproductive/Obstetrics                         Anesthesia Physical Anesthesia Plan  ASA: III  Anesthesia Plan: General   Post-op Pain Management:    Induction: Intravenous  Airway Management Planned: LMA  Additional Equipment:   Intra-op Plan:   Post-operative Plan: Extubation in OR  Informed Consent: I have reviewed the patients History and Physical, chart, labs and discussed the procedure including the risks, benefits and alternatives for the proposed anesthesia with the patient or authorized representative who has indicated his/her understanding and acceptance.     Plan Discussed with: Surgeon and CRNA  Anesthesia Plan Comments:        Anesthesia Quick Evaluation

## 2011-09-07 ENCOUNTER — Encounter (HOSPITAL_COMMUNITY): Payer: Self-pay | Admitting: Orthopedic Surgery

## 2011-09-07 DIAGNOSIS — M7989 Other specified soft tissue disorders: Secondary | ICD-10-CM

## 2011-09-07 LAB — CBC
MCH: 31.2 pg (ref 26.0–34.0)
MCHC: 33.8 g/dL (ref 30.0–36.0)
Platelets: 388 10*3/uL (ref 150–400)
RBC: 3.62 MIL/uL — ABNORMAL LOW (ref 4.22–5.81)
RDW: 13.4 % (ref 11.5–15.5)

## 2011-09-07 LAB — GLUCOSE, CAPILLARY
Glucose-Capillary: 129 mg/dL — ABNORMAL HIGH (ref 70–99)
Glucose-Capillary: 127 mg/dL — ABNORMAL HIGH (ref 70–99)

## 2011-09-07 LAB — BASIC METABOLIC PANEL
Calcium: 8.4 mg/dL (ref 8.4–10.5)
GFR calc non Af Amer: 55 mL/min — ABNORMAL LOW (ref >90)
Sodium: 136 mEq/L (ref 135–145)

## 2011-09-07 LAB — C-REACTIVE PROTEIN: CRP: 1.21 mg/dL — ABNORMAL HIGH (ref <0.60)

## 2011-09-07 MED ORDER — HYDRALAZINE HCL 20 MG/ML IJ SOLN
10.0000 mg | Freq: Four times a day (QID) | INTRAMUSCULAR | Status: DC | PRN
  Administered 2011-09-07 – 2011-09-08 (×2): 10 mg via INTRAVENOUS
  Filled 2011-09-07 (×2): qty 0.5

## 2011-09-07 NOTE — Progress Notes (Signed)
*  PRELIMINARY RESULTS*  Left:  No evidence of deep vein thrombosis or Baker's cyst.    Mila Homer 09/07/2011, 12:20 PM

## 2011-09-07 NOTE — Progress Notes (Signed)
Physical Therapy Evaluation Patient Details Name: [name] MRN: [id number] DOB: August 01, 1961 Today's Date: 09/07/2011  Problem List:  Patient Active Problem List  Diagnoses  . Cellulitis and abscess of foot  . Hyperglycemia  . Anemia  . Diabetes mellitus  . Hypertension  . Iron deficiency anemia    Past Medical History:  Past Medical History  Diagnosis Date  . Diabetes mellitus     New onset   Past Surgical History:  Past Surgical History  Procedure Date  . Leg surgery   . Knee surgery   . Amputation 09/06/2011    Procedure: AMPUTATION RAY;  Surgeon: Toni Arthurs, MD;  Location: [facility] OR;  Service: Orthopedics;  Laterality: Left;  Left Hallux Amputation    PT Assessment/Plan/Recommendation PT Assessment Clinical Impression Statement: Pt presents with a medical diagnosis of L foot cellulitis and hallux amputation. Pt at a supervision level for all mobility. Signing off on pt, reorder if necessary. PT Recommendation/Assessment: Patent does not need any further PT services No Skilled PT: All education completed;Patient will have necessary level of assist by caregiver at discharge;Patient is supervision for all activity/mobility PT Recommendation Follow Up Recommendations: None Equipment Recommended: None recommended by PT PT Goals     PT Evaluation Precautions/Restrictions  Restrictions Weight Bearing Restrictions: No LLE Weight Bearing: Weight bearing as tolerated Prior Functioning  Home Living Lives With: Significant other;Daughter Receives Help From: Family Type of Home: Other (Comment) (may be going to [employer]) Prior Function Level of Independence: Independent with basic ADLs;Independent with gait;Independent with transfers Able to Take Stairs?: Yes Driving: No Vocation: Full time employment Cognition Cognition Arousal/Alertness: Awake/alert Overall Cognitive Status: Appears within functional limits for tasks  assessed Sensation/Coordination Sensation Light Touch: Appears Intact Extremity Assessment RLE Assessment RLE Assessment: Within Functional Limits LLE Assessment LLE Assessment: Within Functional Limits (difficulty with L toe secondary to amputation) Mobility (including Balance) Bed Mobility Bed Mobility: Yes Supine to Sit: 6: Modified independent (Device/Increase time) Sitting - Scoot to Edge of Bed: 6: Modified independent (Device/Increase time) Transfers Transfers: Yes Sit to Stand: 6: Modified independent (Device/Increase time) Stand to Sit: 6: Modified independent (Device/Increase time) Ambulation/Gait Ambulation/Gait: Yes Ambulation/Gait Assistance: 5: Supervision Ambulation/Gait Assistance Details (indicate cue type and reason): Supervision assist secondary to antalgic gait due to LLE Ambulation Distance (Feet): 200 Feet Assistive device: None Gait Pattern: Decreased dorsiflexion - left;Decreased weight shift to left;Step-to pattern Gait velocity: Decreased gait speed Stairs: Yes Stairs Assistance: 5: Supervision Stairs Assistance Details (indicate cue type and reason): VC for proper hand held and sequencing Stair Management Technique: Two rails;Forwards Number of Stairs: 4     Exercise    End of Session PT - End of Session Equipment Utilized During Treatment: Gait belt (L hard sole shoe) Activity Tolerance: Patient tolerated treatment well Patient left: in chair;with call bell in reach Nurse Communication: Mobility status for transfers;Mobility status for ambulation General Behavior During Session: [facility] for tasks performed Cognition: [facility] for tasks performed  Milana Kidney 09/07/2011, 5:36 PM  09/07/2011 Milana Kidney DPT PAGER: [phone] OFFICE: [phone]

## 2011-09-07 NOTE — Progress Notes (Signed)
HEALTH SERVE APPT MADE FOR [facility] ON JAN 29TH AT 1130 AND A DR APPT WITH DR Clelia Croft ON FEB 28TH AT 245 PM. Willa Rough 09/07/2011 [phone] OR [phone]

## 2011-09-07 NOTE — Progress Notes (Signed)
Subjective: 1 Day Post-Op Procedure(s) (LRB): AMPUTATION RAY (Left) Patient reports pain as mild.    Objective: Vital signs in last 24 hours: Temp:  [97.5 F (36.4 C)-98.7 F (37.1 C)] 98.4 F (36.9 C) (01/04 0518) Pulse Rate:  [72-93] 78  (01/04 0518) Resp:  [11-19] 18  (01/04 0518) BP: (140-167)/(62-93) 145/79 mmHg (01/04 0518) SpO2:  [95 %-100 %] 95 % (01/04 0518)  Intake/Output from previous day: 01/03 0701 - 01/04 0700 In: 600 [I.V.:600] Out: 1200 [Urine:1200] Intake/Output this shift:     Basename 09/07/11 0624 09/06/11 0505 09/05/11 2357  HGB 11.3* 11.4* 12.9*    Basename 09/07/11 0624 09/06/11 0505  WBC 11.6* 14.6*  RBC 3.62* 3.71*  HCT 33.4* 34.3*  PLT 388 408*    Basename 09/07/11 0624 09/06/11 0505  NA 136 140  K 3.5 3.2*  CL 106 107  CO2 26 25  BUN 17 17  CREATININE 1.44* 1.24  GLUCOSE 115* 108*  CALCIUM 8.4 8.4     Left hallux dressed and dry.  Assessment/Plan: 1 Day Post-Op Procedure(s) (LRB): AMPUTATION RAY (Left) Continue vanc and zosyn.  Rec ID consult to eval cx results and tailor abx rx. OOB with hard sole shoe on L foot for WB.  Toni Arthurs 09/07/2011, 7:37 AM

## 2011-09-07 NOTE — Op Note (Signed)
[name], [name] NO.:  [id number]  MEDICAL RECORD NO.:  [id number]  LOCATION:  5009                         FACILITY:  [facility]  PHYSICIAN:  Toni Arthurs, MD        DATE OF BIRTH:  [birth year]  DATE OF PROCEDURE:  09/06/2011 DATE OF DISCHARGE:  09/05/2011                              OPERATIVE REPORT   PREOPERATIVE DIAGNOSIS:  Right hallux osteomyelitis.  POSTOP DIAGNOSIS:  Right hallux osteomyelitis.  PROCEDURE:  Right hallux amputation.  SURGEON:  Toni Arthurs, MD  ANESTHESIA:  General.  ESTIMATED BLOOD LOSS:  Minimal.  TOURNIQUET TIME:  Approximately 15 minutes with an ankle Esmarch.  SPECIMENS:  Deep tissue to microbiology for culture, right hallux to pathology.  COMPLICATIONS:  None apparent.  DISPOSITION:  Extubated awake and stable to recovery.  INDICATIONS FOR PROCEDURE:  The patient is a 51 year old male with past medical history significant for uncontrolled type 2 diabetes.  He was admitted to the hospital just before Christmas with cellulitis of the right foot.  He was found to have a metallic foreign body in the deep tissues of the right great toe adjacent to the distal phalanx.  He had IV antibiotics which controlled the cellulitis, however, upon discharge, he stopped taking the antibiotics.  He presented to my clinic yesterday with frank necrosis and purulent drainage of the lateral portion of the hallux.  He was admitted to the hospital overnight and started on IV vancomycin and Zosyn.  Plain x-rays revealed osteomyelitis of the distal phalanx.  He presents now for amputation of the distal phalanx due to osteomyelitis and gangrene.  He understands the risks and benefits of the treatment options and elects to proceed with surgical treatment. Specifically he understands risks of bleeding, infection, nerve damage, blood clots, need for additional surgery, amputation, and death.  PROCEDURE IN DETAIL:  After preoperative consent was  obtained and the correct operative site was identified, the patient was brought to the operating room and placed supine on the operating table.  General anesthesia was induced.  Preoperative antibiotics were administered. Surgical time-out was taken.  Right lower extremity was prepped and draped in standard sterile fashion.  Surgical time-out was taken. Therapeutic antibiotics had previously been administered.  The right foot was exsanguinated and a 4 inch Esmarch tourniquet was wrapped around the ankle.  The necrotic hallux was identified.  A racket style incision was marked around the inner phalangeal joint.  The incision was made.  Sharp dissection was carried down through the skin and subcutaneous tissue to the level of the bone.  The hallux was disarticulated through the IP joint.  The head of the proximal phalanx was exposed and appeared healthy.  A sagittal saw was used to transect the shaft of the proximal phalanx.  The head of the proximal phalanx was sent along with remainder of the toe as a specimen to pathology.  Prior to that, the hallux was opened and deep tissue was obtained and sent as a specimen to microbiology for aerobic and anaerobic culture.  The digital vessels were identified and cauterized.  The wound was irrigated copiously with 2.5 L of normal saline.  Simple and  horizontal mattress sutures were used to close the skin edges.  Sterile dressings were applied followed by a compression wrap.  The tourniquet was released approximately 20 minutes.  The patient was then awakened from anesthesia and transported to the recovery room in stable condition.  FOLLOWUP PLAN:  The patient would be readmitted to the [facility].  He will be continued on IV vancomycin and Zosyn pending the outcomes of his deep tissue cultures.  Infectious disease consultation will be ordered tomorrow.     Toni Arthurs, MD     JH/MEDQ  D:  09/06/2011  T:  09/07/2011   Job:  [id number]

## 2011-09-07 NOTE — Progress Notes (Signed)
[name] CSN:[id number],MRN:[id number] is a 51 y.o. male,  Outpatient Primary MD for the patient is No primary provider on file.  Chief Complaint  Patient presents with  . Foot Pain  . Wound Infection        Subjective:   [name] today has, No headache, No chest pain, No abdominal pain - No Nausea, No new weakness tingling or numbness, No Cough - SOB.     Objective:   Filed Vitals:   09/06/11 2025 09/06/11 2050 09/06/11 2215 09/07/11 0518  BP:  166/93 167/62 145/79  Pulse: 86 81 90 78  Temp: 98.6 F (37 C) 97.6 F (36.4 C) 97.5 F (36.4 C) 98.4 F (36.9 C)  TempSrc:   Oral Oral  Resp: 15 18 18 18   Height:      Weight:      SpO2: 100% 99% 98% 95%    Wt Readings from Last 3 Encounters:  09/06/11 90.719 kg (200 lb)  09/06/11 90.719 kg (200 lb)  08/24/11 87.998 kg (194 lb)     Intake/Output Summary (Last 24 hours) at 09/07/11 1125 Last data filed at 09/07/11 0700  Gross per 24 hour  Intake    600 ml  Output   1200 ml  Net   -600 ml    Exam Awake Alert, Oriented *3, No new F.N deficits, Normal affect [place].AT,PERRAL Supple Neck,No JVD, No cervical lymphadenopathy appriciated.  Symmetrical Chest wall movement, Good air movement bilaterally, CTAB RRR,No Gallops,Rubs or new Murmurs, No Parasternal Heave +ve B.Sounds, Abd Soft, Non tender, No organomegaly appriciated, No rebound -guarding or rigidity. No Cyanosis, Clubbing or edema, No new Rash or bruise, L leg swollen, L toe post amputation in bandage Data Review  CBC  Lab 09/07/11 0624 09/06/11 0505 09/05/11 2357  WBC 11.6* 14.6* 15.3*  HGB 11.3* 11.4* 12.9*  HCT 33.4* 34.3* 39.0  PLT 388 408* 453*  MCV 92.3 92.5 92.4  MCH 31.2 30.7 30.6  MCHC 33.8 33.2 33.1  RDW 13.4 13.5 13.6  LYMPHSABS -- -- --  MONOABS -- -- --  EOSABS -- -- --  BASOSABS -- -- --  BANDABS -- -- --    Chemistries   Lab 09/07/11 0624 09/06/11 0505 09/05/11 2357  NA 136 140 139  K 3.5 3.2* 3.4*  CL 106 107 105  CO2 26 25 26     GLUCOSE 115* 108* 105*  BUN 17 17 19   CREATININE 1.44* 1.24 1.34  CALCIUM 8.4 8.4 9.3  MG -- -- --  AST -- 15 18  ALT -- 12 15  ALKPHOS -- 85 98  BILITOT -- 0.8 0.7   ------------------------------------------------------------------------------------------------------------------ estimated creatinine clearance is 70.7 ml/min (by C-G formula based on Cr of 1.44). ------------------------------------------------------------------------------------------------------------------  [facility] 09/06/11 2119  HGBA1C 7.7*   ------------------------------------------------------------------------------------------------------------------ No results found for this basename: CHOL:2,HDL:2,LDLCALC:2,TRIG:2,CHOLHDL:2,LDLDIRECT:2 in the last 72 hours ------------------------------------------------------------------------------------------------------------------ No results found for this basename: TSH,T4TOTAL,FREET3,T3FREE,THYROIDAB in the last 72 hours ------------------------------------------------------------------------------------------------------------------ No results found for this basename: VITAMINB12:2,FOLATE:2,FERRITIN:2,TIBC:2,IRON:2,RETICCTPCT:2 in the last 72 hours  Coagulation profile No results found for this basename: INR:5,PROTIME:5 in the last 168 hours  No results found for this basename: DDIMER:2 in the last 72 hours  Cardiac Enzymes No results found for this basename: CK:3,CKMB:3,TROPONINI:3,MYOGLOBIN:3 in the last 168 hours ------------------------------------------------------------------------------------------------------------------ No components found with this basename: POCBNP:3  Micro Results Recent Results (from the past 240 hour(s))  TISSUE CULTURE     Status: Normal (Preliminary result)   Collection Time   09/06/11  6:34 PM  Component Value Range Status Comment   Specimen Description TISSUE TOE LEFT   Final    Special Requests DEEP TISSUE CULTURE    Final    Gram Stain     Final    Value: NO WBC SEEN     RARE SQUAMOUS EPITHELIAL CELLS PRESENT     RARE GRAM POSITIVE COCCI     IN PAIRS RARE GRAM NEGATIVE RODS   Culture Culture reincubated for better growth   Final    Report Status PENDING   Incomplete     Radiology Reports Dg Foot Complete Left  09/06/2011  *RADIOLOGY REPORT*  Clinical Data: Diabetic ulcer.  Infection in the great toe after removal of the piece of metal.  LEFT FOOT - COMPLETE 3+ VIEW  Comparison: 08/24/2011  Findings: Since the previous study, the radiopaque foreign body has been removed from the distal aspect of the left first toe.  There is now obvious soft tissue defect in the lateral aspect of the distal first toe.  There is underlying cortical loss and lucency in the distal phalangeal tuft and along the medial edge of the distal phalanx.  Changes to suggest osteomyelitis.  No acute fractures or subluxations.  Vascular calcifications.  A plantar calcaneal spurs.  IMPRESSION: Interval development of soft tissue defect in the first toe with interval cortical erosion of the distal phalanx consistent with osteomyelitis.  Original Report Authenticated By: Marlon Pel, M.D.   Dg Foot Complete Left  08/24/2011  *RADIOLOGY REPORT*  Clinical Data: Pain, spiraling, infection of great toe, metal sliver in the left great toe  LEFT FOOT - COMPLETE 3+ VIEW  Comparison: None.  Findings: No fracture or dislocation is seen.  The joint spaces are preserved.  14 mm radiopaque foreign body along the plantar aspect of the first distal phalanx.  Associated soft tissue swelling of the first digit.  Vascular calcifications.  Plantar and posterior calcaneal enthesopathy.  IMPRESSION: No fracture or dislocation is seen.  14 mm radiopaque foreign body along the plantar aspect of the first distal phalanx.  Associated soft tissue swelling of the first digit.  Original Report Authenticated By: Charline Bills, M.D.    Scheduled Meds:    .  aspirin EC  325 mg Oral Daily  . docusate sodium  100 mg Oral BID  . lisinopril  10 mg Oral Daily  . piperacillin-tazobactam (ZOSYN)  IV  3.375 g Intravenous Q8H  . potassium chloride  10 mEq Intravenous Q1 Hr x 2  . senna  1 tablet Oral BID  . vancomycin  1,250 mg Intravenous Q12H  . DISCONTD: heparin subcutaneous  5,000 Units Subcutaneous Q8H   Continuous Infusions:    . sodium chloride 100 mL/hr at 09/06/11 2052  . DISCONTD: sodium chloride 125 mL/hr at 09/06/11 0507  . DISCONTD: sodium chloride     PRN Meds:.acetaminophen, acetaminophen, HYDROcodone-acetaminophen, HYDROmorphone, LORazepam, ondansetron (ZOFRAN) IV, ondansetron, promethazine, DISCONTD: bacitracin, DISCONTD: HYDROmorphone, DISCONTD: ondansetron (ZOFRAN) IV, DISCONTD: ondansetron, DISCONTD: sodium chloride irrigation .   Assessment & Plan   #1. Left foot cellulitis with osteomyelitis of the distal phalanx of the great toe - patient still has significant swelling of his left foot with an ulcer on the left great toe probably from the foreign body being removed Dr. Laverta [place] office. This time patient will be kept on vancomycin and Zosyn. Patient is post L.1st toe amputation on 09-05-10 by Dr J.Hewitt, D/W ID Dr Orvan Falconer for now [facility], D/W Dr Victorino Dike 09-07-11 wound margins were clean, will discuss  again Monday with ID and Ortho, await [place] too for Leg swelling which most likely is from the infection.   #2. Recently diagnosed diabetes mellitus type 2 - patient will be placed on sliding scale coverage. As he is n.p.o. CBG will be checked every 4 hourly which has to be changed to a.c. and at bedtime when he starts diet. Check A1c, on no home meds for DM.   CBG (last 3)   Basename 09/07/11 1112 09/07/11 0700 09/06/11 2212  GLUCAP 148* 110* 129*    Lab Results  Component Value Date   HGBA1C 7.7* 09/06/2011      #3. Recently diagnosed and deficiency anemia - will need outpatient followup with gastroenterologist for colonoscopy.      #4. History of hypertension - continue present medications.   DVT Prophylaxis Heparin  See all Orders from today for further details     Leroy Sea M.D on 09/07/2011 at 11:25 AM  [facility]  [phone]

## 2011-09-08 LAB — BASIC METABOLIC PANEL
BUN: 12 mg/dL (ref 6–23)
CO2: 23 mEq/L (ref 19–32)
Calcium: 8.1 mg/dL — ABNORMAL LOW (ref 8.4–10.5)
Chloride: 103 mEq/L (ref 96–112)
Creatinine, Ser: 1.31 mg/dL (ref 0.50–1.35)

## 2011-09-08 LAB — CBC
HCT: 34.4 % — ABNORMAL LOW (ref 39.0–52.0)
MCH: 30.7 pg (ref 26.0–34.0)
MCV: 90.3 fL (ref 78.0–100.0)
RBC: 3.81 MIL/uL — ABNORMAL LOW (ref 4.22–5.81)
WBC: 12.2 10*3/uL — ABNORMAL HIGH (ref 4.0–10.5)

## 2011-09-08 LAB — GLUCOSE, CAPILLARY: Glucose-Capillary: 142 mg/dL — ABNORMAL HIGH (ref 70–99)

## 2011-09-08 MED ORDER — METOPROLOL TARTRATE 50 MG PO TABS
50.0000 mg | ORAL_TABLET | Freq: Two times a day (BID) | ORAL | Status: DC
  Administered 2011-09-08 – 2011-09-10 (×5): 50 mg via ORAL
  Filled 2011-09-08 (×6): qty 1

## 2011-09-08 MED ORDER — POTASSIUM CHLORIDE CRYS ER 20 MEQ PO TBCR
40.0000 meq | EXTENDED_RELEASE_TABLET | Freq: Once | ORAL | Status: AC
  Administered 2011-09-08: 40 meq via ORAL
  Filled 2011-09-08: qty 2

## 2011-09-08 MED ORDER — INSULIN ASPART 100 UNIT/ML ~~LOC~~ SOLN
0.0000 [IU] | Freq: Three times a day (TID) | SUBCUTANEOUS | Status: DC
  Filled 2011-09-08: qty 3

## 2011-09-08 MED ORDER — METFORMIN HCL 850 MG PO TABS
850.0000 mg | ORAL_TABLET | Freq: Two times a day (BID) | ORAL | Status: DC
  Administered 2011-09-08 – 2011-09-10 (×4): 850 mg via ORAL
  Filled 2011-09-08 (×6): qty 1

## 2011-09-08 MED ORDER — CLONIDINE HCL 0.1 MG PO TABS
0.1000 mg | ORAL_TABLET | Freq: Four times a day (QID) | ORAL | Status: DC | PRN
  Filled 2011-09-08: qty 1

## 2011-09-08 NOTE — Progress Notes (Signed)
Subjective: 2 Days Post-Op Procedure(s) (LRB): AMPUTATION RAY (Left) Patient reports pain as mild.  Pt denies f/c/n/v.   Objective: Vital signs in last 24 hours: Temp:  [98.2 F (36.8 C)-98.5 F (36.9 C)] 98.5 F (36.9 C) (01/05 0605) Pulse Rate:  [78-87] 81  (01/05 0605) Resp:  [18] 18  (01/05 0605) BP: (159-196)/(84-105) 166/89 mmHg (01/05 0605) SpO2:  [94 %-99 %] 98 % (01/05 0605)  Intake/Output from previous day: 01/04 0701 - 01/05 0700 In: 2580 [P.O.:1080; I.V.:1200; IV Piggyback:300] Out: 1300 [Urine:1300] Intake/Output this shift: Total I/O In: 150 [P.O.:150] Out: 350 [Urine:350]   Basename 09/08/11 0600 09/07/11 0624 09/06/11 0505 09/05/11 2357  HGB 11.7* 11.3* 11.4* 12.9*    Basename 09/08/11 0600 09/07/11 0624  WBC 12.2* 11.6*  RBC 3.81* 3.62*  HCT 34.4* 33.4*  PLT 391 388    Basename 09/08/11 0600 09/07/11 0624  NA 134* 136  K 3.4* 3.5  CL 103 106  CO2 23 26  BUN 12 17  CREATININE 1.31 1.44*  GLUCOSE 107* 115*  CALCIUM 8.1* 8.4   Wound cx grew staph aureus and GNR.  Sens pending.  left hallux dressed and dry.  Brisk cap refill at lesser toes.  Decr sens to LT at lesser toes.  Assessment/Plan: 2 Days Post-Op Procedure(s) (LRB): AMPUTATION RAY (Left)  WBAT in hard sole shoe.  Continue IV abx pending sensitivities.  Will look at foot on Monday morning.  Surgical margins appeared clean of any infection with only healthy tissue remaining.  [name] 09/08/2011, 8:49 AM

## 2011-09-08 NOTE — Progress Notes (Addendum)
[name] CSN:[id number],MRN:[id number] is a 51 y.o. male,  Outpatient Primary MD for the patient is No primary provider on file.  Chief Complaint  Patient presents with  . Foot Pain  . Wound Infection        Subjective:   [name] today has, No headache, No chest pain, No abdominal pain - No Nausea, No new weakness tingling or numbness, No Cough - SOB.     Objective:   Filed Vitals:   09/07/11 2100 09/07/11 2246 09/07/11 2343 09/08/11 0605  BP: 196/105 179/92 159/84 166/89  Pulse: 87   81  Temp: 98.2 F (36.8 C)   98.5 F (36.9 C)  TempSrc: Oral   Oral  Resp: 18   18  Height:      Weight:      SpO2: 94%   98%    Wt Readings from Last 3 Encounters:  09/06/11 90.719 kg (200 lb)  09/06/11 90.719 kg (200 lb)  08/24/11 87.998 kg (194 lb)     Intake/Output Summary (Last 24 hours) at 09/08/11 1409 Last data filed at 09/08/11 1148  Gross per 24 hour  Intake   1880 ml  Output   1650 ml  Net    230 ml    Exam Awake Alert, Oriented *3, No new F.N deficits, Normal affect [place].AT,PERRAL Supple Neck,No JVD, No cervical lymphadenopathy appriciated.  Symmetrical Chest wall movement, Good air movement bilaterally, CTAB RRR,No Gallops,Rubs or new Murmurs, No Parasternal Heave +ve B.Sounds, Abd Soft, Non tender, No organomegaly appriciated, No rebound -guarding or rigidity. No Cyanosis, Clubbing or edema, No new Rash or bruise, L leg swollen, L toe post amputation in bandage Data Review  CBC  Lab 09/08/11 0600 09/07/11 0624 09/06/11 0505 09/05/11 2357  WBC 12.2* 11.6* 14.6* 15.3*  HGB 11.7* 11.3* 11.4* 12.9*  HCT 34.4* 33.4* 34.3* 39.0  PLT 391 388 408* 453*  MCV 90.3 92.3 92.5 92.4  MCH 30.7 31.2 30.7 30.6  MCHC 34.0 33.8 33.2 33.1  RDW 13.1 13.4 13.5 13.6  LYMPHSABS -- -- -- --  MONOABS -- -- -- --  EOSABS -- -- -- --  BASOSABS -- -- -- --  BANDABS -- -- -- --    Chemistries   Lab 09/08/11 0600 09/07/11 0624 09/06/11 0505 09/05/11 2357  NA 134* 136 140 139    K 3.4* 3.5 3.2* 3.4*  CL 103 106 107 105  CO2 23 26 25 26   GLUCOSE 107* 115* 108* 105*  BUN 12 17 17 19   CREATININE 1.31 1.44* 1.24 1.34  CALCIUM 8.1* 8.4 8.4 9.3  MG -- -- -- --  AST -- -- 15 18  ALT -- -- 12 15  ALKPHOS -- -- 85 98  BILITOT -- -- 0.8 0.7   ------------------------------------------------------------------------------------------------------------------ estimated creatinine clearance is 77.8 ml/min (by C-G formula based on Cr of 1.31). ------------------------------------------------------------------------------------------------------------------  [facility] 09/06/11 2119  HGBA1C 7.7*   ------------------------------------------------------------------------------------------------------------------ No results found for this basename: CHOL:2,HDL:2,LDLCALC:2,TRIG:2,CHOLHDL:2,LDLDIRECT:2 in the last 72 hours ------------------------------------------------------------------------------------------------------------------ No results found for this basename: TSH,T4TOTAL,FREET3,T3FREE,THYROIDAB in the last 72 hours ------------------------------------------------------------------------------------------------------------------ No results found for this basename: VITAMINB12:2,FOLATE:2,FERRITIN:2,TIBC:2,IRON:2,RETICCTPCT:2 in the last 72 hours  Coagulation profile No results found for this basename: INR:5,PROTIME:5 in the last 168 hours  No results found for this basename: DDIMER:2 in the last 72 hours  Cardiac Enzymes No results found for this basename: CK:3,CKMB:3,TROPONINI:3,MYOGLOBIN:3 in the last 168 hours ------------------------------------------------------------------------------------------------------------------ No components found with this basename: POCBNP:3  Micro Results Recent Results (from the past 240 hour(s))  TISSUE CULTURE     Status: Normal (Preliminary result)   Collection Time   09/06/11  6:34 PM      Component Value Range Status Comment    Specimen Description TISSUE TOE LEFT   Final    Special Requests DEEP TISSUE CULTURE   Final    Gram Stain     Final    Value: NO WBC SEEN     RARE SQUAMOUS EPITHELIAL CELLS PRESENT     RARE GRAM POSITIVE COCCI     IN PAIRS RARE GRAM NEGATIVE RODS   Culture     Final    Value: MODERATE STAPHYLOCOCCUS AUREUS     Note: RIFAMPIN AND GENTAMICIN SHOULD NOT BE USED AS SINGLE DRUGS FOR TREATMENT OF STAPH INFECTIONS.     FEW GRAM NEGATIVE RODS   Report Status PENDING   Incomplete     Radiology Reports Dg Foot Complete Left  09/06/2011  *RADIOLOGY REPORT*  Clinical Data: Diabetic ulcer.  Infection in the great toe after removal of the piece of metal.  LEFT FOOT - COMPLETE 3+ VIEW  Comparison: 08/24/2011  Findings: Since the previous study, the radiopaque foreign body has been removed from the distal aspect of the left first toe.  There is now obvious soft tissue defect in the lateral aspect of the distal first toe.  There is underlying cortical loss and lucency in the distal phalangeal tuft and along the medial edge of the distal phalanx.  Changes to suggest osteomyelitis.  No acute fractures or subluxations.  Vascular calcifications.  A plantar calcaneal spurs.  IMPRESSION: Interval development of soft tissue defect in the first toe with interval cortical erosion of the distal phalanx consistent with osteomyelitis.  Original Report Authenticated By: Marlon Pel, M.D.   Dg Foot Complete Left  08/24/2011  *RADIOLOGY REPORT*  Clinical Data: Pain, spiraling, infection of great toe, metal sliver in the left great toe  LEFT FOOT - COMPLETE 3+ VIEW  Comparison: None.  Findings: No fracture or dislocation is seen.  The joint spaces are preserved.  14 mm radiopaque foreign body along the plantar aspect of the first distal phalanx.  Associated soft tissue swelling of the first digit.  Vascular calcifications.  Plantar and posterior calcaneal enthesopathy.  IMPRESSION: No fracture or dislocation is seen.   14 mm radiopaque foreign body along the plantar aspect of the first distal phalanx.  Associated soft tissue swelling of the first digit.  Original Report Authenticated By: Charline Bills, M.D.    Scheduled Meds:    . aspirin EC  325 mg Oral Daily  . docusate sodium  100 mg Oral BID  . lisinopril  10 mg Oral Daily  . metoprolol tartrate  50 mg Oral BID  . piperacillin-tazobactam (ZOSYN)  IV  3.375 g Intravenous Q8H  . potassium chloride  40 mEq Oral Once  . senna  1 tablet Oral BID  . vancomycin  1,250 mg Intravenous Q12H   Continuous Infusions:    . DISCONTD: sodium chloride 100 mL/hr at 09/07/11 1859   PRN Meds:.acetaminophen, acetaminophen, hydrALAZINE, HYDROcodone-acetaminophen, HYDROmorphone, ondansetron (ZOFRAN) IV, ondansetron, promethazine .   Assessment & Plan   #1. Left foot cellulitis with osteomyelitis of the distal phalanx of the great toe - patient still has significant swelling of his left foot with an ulcer on the left great toe probably from the foreign body being removed Dr. Laverta [place] office. This time patient will be kept on vancomycin and Zosyn. Patient is post L.1st toe amputation  on 09-05-10 by Dr J.Hewitt, D/W ID Dr Orvan Falconer for now [facility], D/W Dr Victorino Dike 09-07-11 wound margins were clean, will discuss again Monday with ID and Ortho, [place] -ve for clots, final cultures pending.   #2. Recently diagnosed diabetes mellitus type 2 - patient will be placed on sliding scale coverage. Resume Glucophage.   CBG (last 3)   Basename 09/08/11 1032 09/08/11 0604 09/07/11 2121  GLUCAP 142* 106* 127*    Lab Results  Component Value Date   HGBA1C 7.7* 09/06/2011      #3. Recently diagnosed and deficiency anemia - will need outpatient followup with gastroenterologist for colonoscopy.     #4. History of hypertension - running high, on ACE added Lopressor + PRN clonidine.   DVT Prophylaxis Heparin  See all Orders from today for further details     Leroy Sea M.D  on 09/08/2011 at 2:09 PM  [facility]  [phone]

## 2011-09-08 NOTE — Progress Notes (Signed)
Orthopedic Tech Progress Note Patient Details:  [name] 01-24-1961 [id number]  Other Ortho Devices Type of Ortho Device: Postop boot Ortho Device Location: fit patient for post op shoe. left foot.   Gaye Pollack 09/08/2011, 2:52 PM

## 2011-09-09 LAB — TISSUE CULTURE: Gram Stain: NONE SEEN

## 2011-09-09 LAB — BASIC METABOLIC PANEL
CO2: 25 mEq/L (ref 19–32)
Calcium: 8.4 mg/dL (ref 8.4–10.5)
Creatinine, Ser: 1.44 mg/dL — ABNORMAL HIGH (ref 0.50–1.35)
GFR calc non Af Amer: 55 mL/min — ABNORMAL LOW (ref >90)
Glucose, Bld: 99 mg/dL (ref 70–99)

## 2011-09-09 LAB — CBC
Hemoglobin: 11.4 g/dL — ABNORMAL LOW (ref 13.0–17.0)
MCH: 30.6 pg (ref 26.0–34.0)
MCHC: 33.5 g/dL (ref 30.0–36.0)
MCV: 91.2 fL (ref 78.0–100.0)
Platelets: 380 10*3/uL (ref 150–400)
RBC: 3.73 MIL/uL — ABNORMAL LOW (ref 4.22–5.81)

## 2011-09-09 LAB — GLUCOSE, CAPILLARY: Glucose-Capillary: 124 mg/dL — ABNORMAL HIGH (ref 70–99)

## 2011-09-09 MED ORDER — AMOXICILLIN-POT CLAVULANATE 875-125 MG PO TABS
1.0000 | ORAL_TABLET | Freq: Two times a day (BID) | ORAL | Status: DC
  Administered 2011-09-09 – 2011-09-10 (×3): 1 via ORAL
  Filled 2011-09-09 (×5): qty 1

## 2011-09-09 MED ORDER — METFORMIN HCL 850 MG PO TABS: 850.0000 mg | ORAL_TABLET | Freq: Two times a day (BID) | ORAL | Status: DC

## 2011-09-09 NOTE — Progress Notes (Signed)
Subjective: 3 Days Post-Op Procedure(s) (LRB): AMPUTATION RAY (Left) Patient reports pain as minimal.  No n/v/f/c.  Objective: Vital signs in last 24 hours: Temp:  [97.1 F (36.2 C)-98.8 F (37.1 C)] 97.1 F (36.2 C) (01/06 0618) Pulse Rate:  [80-86] 86  (01/06 0618) Resp:  [16-18] 18  (01/06 0618) BP: (142-156)/(80-82) 150/80 mmHg (01/06 0618) SpO2:  [95 %-98 %] 96 % (01/06 0618)  Intake/Output from previous day: 01/05 0701 - 01/06 0700 In: 1225 [P.O.:275; IV Piggyback:950] Out: 650 [Urine:650] Intake/Output this shift: Total I/O In: 150 [P.O.:150] Out: 500 [Urine:500]   Basename 09/09/11 0600 09/08/11 0600 09/07/11 0624  HGB 11.4* 11.7* 11.3*    Basename 09/09/11 0600 09/08/11 0600  WBC 12.2* 12.2*  RBC 3.73* 3.81*  HCT 34.0* 34.4*  PLT 380 391    Basename 09/09/11 0600 09/08/11 0600  NA 138 134*  K 3.5 3.4*  CL 106 103  CO2 25 23  BUN 13 12  CREATININE 1.44* 1.31  GLUCOSE 99 107*  CALCIUM 8.4 8.1*   PE:  Wound appears to be healing well without signs of cellulitis.  No drainage, erythema, warmth or tenderness.  Minimal swelling appears appropriate for post surgical wound.  Cx:  E coli and staph aureus.    Assessment/Plan: 3 Days Post-Op Procedure(s) (LRB): AMPUTATION RAY (Left) Dressing changed today.  Based on the appearance of the wound, oral abx could be considered for discharge.  He can bear weight as tolerated in his post op shoe. Pt will need to f/u with me in clinic in approx. 10 days.  He should call [phone] for an appointment.  I'll sign off.  Please call with any questions.  [phone]. No need for any DVT prophylaxis at discharge in the ambulatory patient.  Toni Arthurs 09/09/2011, 10:52 AM

## 2011-09-09 NOTE — Progress Notes (Signed)
Kylian Loh CSN:[id number],MRN:[id number] is a 51 y.o. male,  Outpatient Primary MD for the patient is No primary provider on file.  Chief Complaint  Patient presents with  . Foot Pain  . Wound Infection        Subjective:   [name] today has, No headache, No chest pain, No abdominal pain - No Nausea, No new weakness tingling or numbness, No Cough - SOB.     Objective:   Filed Vitals:   09/08/11 0605 09/08/11 1400 09/08/11 2242 09/09/11 0618  BP: 166/89 142/82 156/82 150/80  Pulse: 81 80 80 86  Temp: 98.5 F (36.9 C) 98.1 F (36.7 C) 98.8 F (37.1 C) 97.1 F (36.2 C)  TempSrc: Oral Oral Oral   Resp: 18 16 18 18   Height:      Weight:      SpO2: 98% 98% 95% 96%    Wt Readings from Last 3 Encounters:  09/06/11 90.719 kg (200 lb)  09/06/11 90.719 kg (200 lb)  08/24/11 87.998 kg (194 lb)     Intake/Output Summary (Last 24 hours) at 09/09/11 1225 Last data filed at 09/09/11 0900  Gross per 24 hour  Intake   1225 ml  Output    500 ml  Net    725 ml    Exam Awake Alert, Oriented *3, No new F.N deficits, Normal affect [place].AT,PERRAL Supple Neck,No JVD, No cervical lymphadenopathy appriciated.  Symmetrical Chest wall movement, Good air movement bilaterally, CTAB RRR,No Gallops,Rubs or new Murmurs, No Parasternal Heave +ve B.Sounds, Abd Soft, Non tender, No organomegaly appriciated, No rebound -guarding or rigidity. No Cyanosis, Clubbing or edema, No new Rash or bruise, L leg swollen, L toe post amputation in bandage Data Review  CBC  Lab 09/09/11 0600 09/08/11 0600 09/07/11 0624 09/06/11 0505 09/05/11 2357  WBC 12.2* 12.2* 11.6* 14.6* 15.3*  HGB 11.4* 11.7* 11.3* 11.4* 12.9*  HCT 34.0* 34.4* 33.4* 34.3* 39.0  PLT 380 391 388 408* 453*  MCV 91.2 90.3 92.3 92.5 92.4  MCH 30.6 30.7 31.2 30.7 30.6  MCHC 33.5 34.0 33.8 33.2 33.1  RDW 13.4 13.1 13.4 13.5 13.6  LYMPHSABS -- -- -- -- --  MONOABS -- -- -- -- --  EOSABS -- -- -- -- --  BASOSABS -- -- -- -- --  BANDABS  -- -- -- -- --    Chemistries   Lab 09/09/11 0600 09/08/11 0600 09/07/11 0624 09/06/11 0505 09/05/11 2357  NA 138 134* 136 140 139  K 3.5 3.4* 3.5 3.2* 3.4*  CL 106 103 106 107 105  CO2 25 23 26 25 26   GLUCOSE 99 107* 115* 108* 105*  BUN 13 12 17 17 19   CREATININE 1.44* 1.31 1.44* 1.24 1.34  CALCIUM 8.4 8.1* 8.4 8.4 9.3  MG -- -- -- -- --  AST -- -- -- 15 18  ALT -- -- -- 12 15  ALKPHOS -- -- -- 85 98  BILITOT -- -- -- 0.8 0.7   ------------------------------------------------------------------------------------------------------------------ estimated creatinine clearance is 70.7 ml/min (by C-G formula based on Cr of 1.44). ------------------------------------------------------------------------------------------------------------------  [facility] 09/06/11 2119  HGBA1C 7.7*   ------------------------------------------------------------------------------------------------------------------ No results found for this basename: CHOL:2,HDL:2,LDLCALC:2,TRIG:2,CHOLHDL:2,LDLDIRECT:2 in the last 72 hours ------------------------------------------------------------------------------------------------------------------ No results found for this basename: TSH,T4TOTAL,FREET3,T3FREE,THYROIDAB in the last 72 hours ------------------------------------------------------------------------------------------------------------------ No results found for this basename: VITAMINB12:2,FOLATE:2,FERRITIN:2,TIBC:2,IRON:2,RETICCTPCT:2 in the last 72 hours  Coagulation profile No results found for this basename: INR:5,PROTIME:5 in the last 168 hours  No results found for this basename: DDIMER:2 in the  last 72 hours  Cardiac Enzymes No results found for this basename: CK:3,CKMB:3,TROPONINI:3,MYOGLOBIN:3 in the last 168 hours ------------------------------------------------------------------------------------------------------------------ No components found with this basename: POCBNP:3  Micro  Results Recent Results (from the past 240 hour(s))  TISSUE CULTURE     Status: Normal   Collection Time   09/06/11  6:34 PM      Component Value Range Status Comment   Specimen Description TISSUE TOE LEFT   Final    Special Requests DEEP TISSUE CULTURE   Final    Gram Stain     Final    Value: NO WBC SEEN     RARE SQUAMOUS EPITHELIAL CELLS PRESENT     RARE GRAM POSITIVE COCCI     IN PAIRS RARE GRAM NEGATIVE RODS   Culture     Final    Value: MODERATE STAPHYLOCOCCUS AUREUS     Note: RIFAMPIN AND GENTAMICIN SHOULD NOT BE USED AS SINGLE DRUGS FOR TREATMENT OF STAPH INFECTIONS.     FEW ESCHERICHIA COLI   Report Status 09/09/2011 FINAL   Final    Organism ID, Bacteria STAPHYLOCOCCUS AUREUS   Final    Organism ID, Bacteria ESCHERICHIA COLI   Final     Radiology Reports Dg Foot Complete Left  09/06/2011  *RADIOLOGY REPORT*  Clinical Data: Diabetic ulcer.  Infection in the great toe after removal of the piece of metal.  LEFT FOOT - COMPLETE 3+ VIEW  Comparison: 08/24/2011  Findings: Since the previous study, the radiopaque foreign body has been removed from the distal aspect of the left first toe.  There is now obvious soft tissue defect in the lateral aspect of the distal first toe.  There is underlying cortical loss and lucency in the distal phalangeal tuft and along the medial edge of the distal phalanx.  Changes to suggest osteomyelitis.  No acute fractures or subluxations.  Vascular calcifications.  A plantar calcaneal spurs.  IMPRESSION: Interval development of soft tissue defect in the first toe with interval cortical erosion of the distal phalanx consistent with osteomyelitis.  Original Report Authenticated By: Marlon Pel, M.D.   Dg Foot Complete Left  08/24/2011  *RADIOLOGY REPORT*  Clinical Data: Pain, spiraling, infection of great toe, metal sliver in the left great toe  LEFT FOOT - COMPLETE 3+ VIEW  Comparison: None.  Findings: No fracture or dislocation is seen.  The joint  spaces are preserved.  14 mm radiopaque foreign body along the plantar aspect of the first distal phalanx.  Associated soft tissue swelling of the first digit.  Vascular calcifications.  Plantar and posterior calcaneal enthesopathy.  IMPRESSION: No fracture or dislocation is seen.  14 mm radiopaque foreign body along the plantar aspect of the first distal phalanx.  Associated soft tissue swelling of the first digit.  Original Report Authenticated By: Charline Bills, M.D.    Scheduled Meds:    . amoxicillin-clavulanate  1 tablet Oral Q12H  . aspirin EC  325 mg Oral Daily  . docusate sodium  100 mg Oral BID  . lisinopril  10 mg Oral Daily  . metFORMIN  850 mg Oral BID WC  . metFORMIN  850 mg Oral BID WC  . metoprolol tartrate  50 mg Oral BID  . potassium chloride  40 mEq Oral Once  . senna  1 tablet Oral BID  . DISCONTD: insulin aspart  0-9 Units Subcutaneous TID WC  . DISCONTD: piperacillin-tazobactam (ZOSYN)  IV  3.375 g Intravenous Q8H  . DISCONTD: vancomycin  1,250 mg Intravenous  Q12H   Continuous Infusions:    . DISCONTD: sodium chloride 100 mL/hr at 09/07/11 1859   PRN Meds:.acetaminophen, acetaminophen, cloNIDine, hydrALAZINE, HYDROcodone-acetaminophen, HYDROmorphone, ondansetron (ZOFRAN) IV, ondansetron, promethazine .   Assessment & Plan   #1. Left foot cellulitis with osteomyelitis of the distal phalanx of the great toe - Patient is post L.1st toe amputation on 09-05-10 by Dr J.Hewitt, D/W ID Dr Orvan Falconer Po Augmentin x 14 days then per ortho, D/W Dr Victorino Dike 09-07-11 wound margins were clean, final cultures noted, DC in am, s work to arrange for followup PCP-Ortho & W care.,   #2. Recently diagnosed DM-2 -  Resume Glucophage.   CBG (last 3)   Basename 09/09/11 1120 09/08/11 2212 09/08/11 1626  GLUCAP 129* 106* 110*    Lab Results  Component Value Date   HGBA1C 7.7* 09/06/2011      #3. Recently diagnosed and deficiency anemia - will need outpatient followup with  gastroenterologist for colonoscopy.    #4. History of hypertension - running high, on ACE added Lopressor + PRN clonidine. Stable.   DVT Prophylaxis Heparin  See all Orders from today for further details     Leroy Sea M.D on 09/09/2011 at 12:25 PM  [facility] Office  [phone]

## 2011-09-09 NOTE — Progress Notes (Signed)
ANTIBIOTIC CONSULT NOTE - FOLLOW UP  Pharmacy Consult for Vancomycin/Zosyn Indication: Left foot osteo/cellulits  No Known Allergies  Patient Measurements: Height: 5\' 11"  (180.3 cm) Weight: 200 lb (90.719 kg) IBW/kg (Calculated) : 75.3   Vital Signs: Temp: 97.1 F (36.2 C) (01/06 0618) BP: 150/80 mmHg (01/06 0618) Pulse Rate: 86  (01/06 0618) Intake/Output from previous day: 01/05 0701 - 01/06 0700 In: 1225 [P.O.:275; IV Piggyback:950] Out: 650 [Urine:650] Intake/Output from this shift: Total I/O In: 150 [P.O.:150] Out: 500 [Urine:500]  Labs:  Basename 09/09/11 0600 09/08/11 0600 09/07/11 0624  WBC 12.2* 12.2* 11.6*  HGB 11.4* 11.7* 11.3*  PLT 380 391 388  LABCREA -- -- --  CREATININE 1.44* 1.31 1.44*   Estimated Creatinine Clearance: 70.7 ml/min (by C-G formula based on Cr of 1.44).  Basename 09/09/11 0930  VANCOTROUGH 18.9  VANCOPEAK --  VANCORANDOM --  GENTTROUGH --  GENTPEAK --  GENTRANDOM --  TOBRATROUGH --  TOBRAPEAK --  TOBRARND --  AMIKACINPEAK --  AMIKACINTROU --  AMIKACIN --     Microbiology: Recent Results (from the past 720 hour(s))  CULTURE, BLOOD (ROUTINE X 2)     Status: Normal   Collection Time   08/24/11  1:25 AM      Component Value Range Status Comment   Specimen Description BLOOD LEFT ARM   Final    Special Requests BOTTLES DRAWN AEROBIC AND ANAEROBIC 10CC EACH   Final    Setup Time [id number]   Final    Culture NO GROWTH 5 DAYS   Final    Report Status 08/30/2011 FINAL   Final   CULTURE, BLOOD (ROUTINE X 2)     Status: Normal   Collection Time   08/24/11  1:35 AM      Component Value Range Status Comment   Specimen Description BLOOD RIGHT HAND   Final    Special Requests BOTTLES DRAWN AEROBIC AND ANAEROBIC 10CC EACH   Final    Setup Time [id number]   Final    Culture NO GROWTH 5 DAYS   Final    Report Status 08/30/2011 FINAL   Final   TISSUE CULTURE     Status: Normal   Collection Time   09/06/11  6:34 PM      Component  Value Range Status Comment   Specimen Description TISSUE TOE LEFT   Final    Special Requests DEEP TISSUE CULTURE   Final    Gram Stain     Final    Value: NO WBC SEEN     RARE SQUAMOUS EPITHELIAL CELLS PRESENT     RARE GRAM POSITIVE COCCI     IN PAIRS RARE GRAM NEGATIVE RODS   Culture     Final    Value: MODERATE STAPHYLOCOCCUS AUREUS     Note: RIFAMPIN AND GENTAMICIN SHOULD NOT BE USED AS SINGLE DRUGS FOR TREATMENT OF STAPH INFECTIONS.     FEW ESCHERICHIA COLI   Report Status 09/09/2011 FINAL   Final    Organism ID, Bacteria STAPHYLOCOCCUS AUREUS   Final    Organism ID, Bacteria ESCHERICHIA COLI   Final     Anti-infectives     Start     Dose/Rate Route Frequency Ordered Stop   09/06/11 1000   vancomycin (VANCOCIN) 1,250 mg in sodium chloride 0.9 % 250 mL IVPB        1,250 mg 166.7 mL/hr over 90 Minutes Intravenous Every 12 hours 09/06/11 0417     09/06/11 0500  piperacillin-tazobactam (ZOSYN) IVPB  3.375 g       3.375 g 12.5 mL/hr over 240 Minutes Intravenous 3 times per day 09/06/11 0417     09/06/11 0230   vancomycin (VANCOCIN) IVPB 1000 mg/200 mL premix        1,000 mg 200 mL/hr over 60 Minutes Intravenous  Once 09/06/11 0223 09/06/11 1610          Assessment: 51 y/o male patient receiving day #3 vanc/zosyn for Left foot infection. Wound cx grew ecoli and MSSA with sens above. Could stream abx to rocephin or ancef, or po abx ceftin, many choices. Renal fxn stable, obtained vanc trough today =18.9, vanc /zosyn doses appropriate.  Goal of Therapy:  Vancomycin trough level 15-20 mcg/ml  Plan:  Cont vanc/zosyn with plan to streamline abx.  Verlene Mayer, PharmD, BCPS Pager [phone] 09/09/2011,11:41 AM

## 2011-09-10 LAB — GLUCOSE, CAPILLARY
Glucose-Capillary: 107 mg/dL — ABNORMAL HIGH (ref 70–99)
Glucose-Capillary: 99 mg/dL (ref 70–99)

## 2011-09-10 MED ORDER — METOPROLOL TARTRATE 50 MG PO TABS: 50.0000 mg | ORAL_TABLET | Freq: Two times a day (BID) | ORAL | Status: DC

## 2011-09-10 MED ORDER — METFORMIN HCL 850 MG PO TABS: 850.0000 mg | ORAL_TABLET | Freq: Two times a day (BID) | ORAL | Status: DC

## 2011-09-10 MED ORDER — AMOXICILLIN-POT CLAVULANATE 875-125 MG PO TABS: 1.0000 | ORAL_TABLET | Freq: Two times a day (BID) | ORAL | Status: AC

## 2011-09-10 MED ORDER — ASPIRIN EC 81 MG PO TBEC: 81.0000 mg | DELAYED_RELEASE_TABLET | Freq: Every day | ORAL | Status: DC

## 2011-09-10 MED ORDER — OXYCODONE-ACETAMINOPHEN 5-325 MG PO TABS: 1.0000 | ORAL_TABLET | Freq: Four times a day (QID) | ORAL | Status: AC | PRN

## 2011-09-10 MED ORDER — AMLODIPINE BESYLATE 5 MG PO TABS: 5.0000 mg | ORAL_TABLET | Freq: Every day | ORAL | Status: DC

## 2011-09-10 NOTE — Progress Notes (Signed)
PT IS TO BE DC'D TO [employer] TODAY TO REGISTER FOR A [facility]INTER SHELTER.  [facility] WILL FOLLOW PT FOR HH RN AND WILL PROVIDE PT WITH A GLUCOMETER AND SUPPLIES FOR 30 DAYS.  PT WILL F/U WITH [facility] FOR LOCATION OF WINTER SHELTER.  PT WILL F/U WITH DR. Victorino Dike AS OP.  CHECKING TO SEE IF PT WILL BE ELIG FOR ZZ FUND TO GET DC MEDS.   [name] [phone] OR [phone] 09/10/2011

## 2011-09-10 NOTE — Progress Notes (Signed)
Clinical social worker met with pt who stated he would not be able to return to his apartment. Pt is medically stable and will be followed by home health for wound care. Pt plans to dc to [employer] to the winter emergency shelters. Pt was provided with a bus pass, a food voucher, and resources in the community to follow up with for assistance with homelessness, and health care at the [facility] and health serve. No further csw needs. Signing off.    Catha Gosselin, Theresia Majors  [phone] .09/10/2011 11:58am

## 2011-09-10 NOTE — Discharge Summary (Signed)
[name], 51 y.o., DOB 27-Dec-1960, MRN [id number]. Admission date: 09/05/2011 Discharge Date 09/10/2011 Primary MD No primary provider on file. Admitting Physician Leroy Sea, MD  Admission Diagnosis  Type 2 diabetes mellitus with left diabetic foot ulcer [250.80, 707.15] infection  on lt foot  Right Great Toe Right Great Toe  Discharge Diagnosis   Principal Problem:  *Cellulitis and abscess of foot Active Problems:  Diabetes mellitus  Iron deficiency anemia    Past Medical History  Diagnosis Date  . Diabetes mellitus     New onset    Past Surgical History  Procedure Date  . Leg surgery   . Knee surgery   . Amputation 09/06/2011    Procedure: AMPUTATION RAY;  Surgeon: Toni Arthurs, MD;  Location: [facility] OR;  Service: Orthopedics;  Laterality: Left;  Left Hallux Amputation     Hospital Course See H&P, Labs, Consult and Test reports for all details in brief, patient was admitted for **  #1. Left foot cellulitis with osteomyelitis of the distal phalanx of the great toe - Patient is post L.1st toe amputation on 09-05-10 by Dr J.Hewitt, D/W ID Dr Orvan Falconer Po Augmentin x 14 days then per ortho, D/W Dr Victorino Dike 09-07-11 wound margins were clean, final cultures noted, DC iS work arranged for Home health at the shelter, will see Ortho in a week.    #2. Recently diagnosed DM-2 - Resume Glucophage.   CBG (last 3)   Basename 09/10/11 0625 09/09/11 2222 09/09/11 1621  GLUCAP 107* 124* 128*    Lab Results   Component  Value  Date    HGBA1C  7.7*  09/06/2011      #3. Recently diagnosed and deficiency anemia - will need outpatient followup with gastroenterologist for colonoscopy.    #4. History of hypertension - Lopressor+ Norvasc outpt monitor.   #5. ? Early CKD - creat between 1.3-1.4, hold off ACE, monitor closely outpt.      Consults Ortho, & ID on phone  Significant Tests:  See full reports for all details     Dg Foot Complete Left  09/06/2011  *RADIOLOGY REPORT*   Clinical Data: Diabetic ulcer.  Infection in the great toe after removal of the piece of metal.  LEFT FOOT - COMPLETE 3+ VIEW  Comparison: 08/24/2011  Findings: Since the previous study, the radiopaque foreign body has been removed from the distal aspect of the left first toe.  There is now obvious soft tissue defect in the lateral aspect of the distal first toe.  There is underlying cortical loss and lucency in the distal phalangeal tuft and along the medial edge of the distal phalanx.  Changes to suggest osteomyelitis.  No acute fractures or subluxations.  Vascular calcifications.  A plantar calcaneal spurs.  IMPRESSION: Interval development of soft tissue defect in the first toe with interval cortical erosion of the distal phalanx consistent with osteomyelitis.  Original Report Authenticated By: Marlon Pel, M.D.   Dg Foot Complete Left  08/24/2011  *RADIOLOGY REPORT*  Clinical Data: Pain, spiraling, infection of great toe, metal sliver in the left great toe  LEFT FOOT - COMPLETE 3+ VIEW  Comparison: None.  Findings: No fracture or dislocation is seen.  The joint spaces are preserved.  14 mm radiopaque foreign body along the plantar aspect of the first distal phalanx.  Associated soft tissue swelling of the first digit.  Vascular calcifications.  Plantar and posterior calcaneal enthesopathy.  IMPRESSION: No fracture or dislocation is seen.  14 mm radiopaque  foreign body along the plantar aspect of the first distal phalanx.  Associated soft tissue swelling of the first digit.  Original Report Authenticated By: Charline Bills, M.D.     Today   Subjective:   [name] today has no headache,no chest abdominal pain,no new weakness tingling or numbness, feels much better wants to go home today.    Objective:   Blood pressure 168/86, pulse 66, temperature 97.1 F (36.2 C), temperature source Oral, resp. rate 20, height 5\' 11"  (1.803 m), weight 90.719 kg (200 lb), SpO2 99.00%.  Intake/Output  Summary (Last 24 hours) at 09/10/11 1129 Last data filed at 09/09/11 1500  Gross per 24 hour  Intake    125 ml  Output    550 ml  Net   -425 ml    Exam Awake Alert, Oriented *3, No new F.N deficits, Normal affect [employer].AT,PERRAL Supple Neck,No JVD, No cervical lymphadenopathy appriciated.  Symmetrical Chest wall movement, Good air movement bilaterally, CTAB RRR,No Gallops,Rubs or new Murmurs, No Parasternal Heave +ve B.Sounds, Abd Soft, Non tender, No organomegaly appriciated, No rebound -guarding or rigidity. No Cyanosis, Clubbing or edema, No new Rash or bruise  Data Review      CBC w Diff: Lab Results  Component Value Date   WBC 12.2* 09/09/2011   HGB 11.4* 09/09/2011   HCT 34.0* 09/09/2011   PLT 380 09/09/2011   LYMPHOPCT 21 08/24/2011   MONOPCT 9 08/24/2011   EOSPCT 3 08/24/2011   BASOPCT 0 08/24/2011   CMP: Lab Results  Component Value Date   NA 138 09/09/2011   K 3.5 09/09/2011   CL 106 09/09/2011   CO2 25 09/09/2011   BUN 13 09/09/2011   CREATININE 1.44* 09/09/2011   PROT 6.3 09/06/2011   ALBUMIN 2.0* 09/06/2011   BILITOT 0.8 09/06/2011   ALKPHOS 85 09/06/2011   AST 15 09/06/2011   ALT 12 09/06/2011  .  Micro Results Recent Results (from the past 240 hour(s))  TISSUE CULTURE     Status: Normal   Collection Time   09/06/11  6:34 PM      Component Value Range Status Comment   Specimen Description TISSUE TOE LEFT   Final    Special Requests DEEP TISSUE CULTURE   Final    Gram Stain     Final    Value: NO WBC SEEN     RARE SQUAMOUS EPITHELIAL CELLS PRESENT     RARE GRAM POSITIVE COCCI     IN PAIRS RARE GRAM NEGATIVE RODS   Culture     Final    Value: MODERATE STAPHYLOCOCCUS AUREUS     Note: RIFAMPIN AND GENTAMICIN SHOULD NOT BE USED AS SINGLE DRUGS FOR TREATMENT OF STAPH INFECTIONS.     FEW ESCHERICHIA COLI   Report Status 09/09/2011 FINAL   Final    Organism ID, Bacteria STAPHYLOCOCCUS AUREUS   Final    Organism ID, Bacteria ESCHERICHIA COLI   Final      Discharge Instructions      Follow with Primary MD/ Free clinic  in 7 days   Get CBC, CMP, checked 7 days by Primary MD and again as instructed by your Primary MD.   Keep your Wound clean and dry at all times.  Get Medicines reviewed and adjusted.  Please request your Prim.MD to go over all Hospital Tests and Procedure/Radiological results at the follow up, please get all Hospital records sent to your Prim MD by signing hospital release before you go home.  Follow-up  Information    Follow up with Toni Arthurs, MD. Make an appointment in 1 week.   Contact information:   [address], [address] [place] [address] [phone]          Activity: Fall precautions use walker/cane & assistance as needed  Diet: Diabetic   Disposition Shelter Home  If you experience worsening of your admission symptoms, develop shortness of breath, life threatening emergency, suicidal or homicidal thoughts you must seek medical attention immediately by calling 911 or calling your MD immediately  if symptoms less severe.  You Must read complete instructions/literature along with all the possible adverse reactions/side effects for all the Medicines you take and that have been prescribed to you. Take any new Medicines after you have completely understood and accpet all the possible adverse reactions/side effects.   Do not drive if your were admitted for syncope or siezures until you have seen by Primary MD or a Neurologist and advised to drive.  Do not drive when taking Pain medications.    Do not take more than prescribed Pain, Sleep and Anxiety Medications  Special Instructions: If you have smoked or chewed Tobacco  in the last 2 yrs please stop smoking, stop any regular Alcohol  and or any Recreational drug use.  Wear Seat belts while driving.  Follow-up Information    Follow up with Toni Arthurs, MD. Make an appointment in 1 week.   Contact information:   [address] [place] [address] [phone]          Discharge Medications   Medication List  As of 09/10/2011 11:29 AM   START taking these medications         amLODipine 5 MG tablet   Commonly known as: NORVASC   Take 1 tablet (5 mg total) by mouth daily.      aspirin EC 81 MG tablet   Take 1 tablet (81 mg total) by mouth daily.      metFORMIN 850 MG tablet   Commonly known as: GLUCOPHAGE   Take 1 tablet (850 mg total) by mouth 2 (two) times daily with a meal.      metoprolol 50 MG tablet   Commonly known as: LOPRESSOR   Take 1 tablet (50 mg total) by mouth 2 (two) times daily.         CHANGE how you take these medications         amoxicillin-clavulanate 875-125 MG per tablet   Commonly known as: AUGMENTIN   Take 1 tablet by mouth every 12 (twelve) hours.   What changed: how often to take the med      oxyCODONE-acetaminophen 5-325 MG per tablet   Commonly known as: PERCOCET   Take 1 tablet by mouth every 6 (six) hours as needed for pain. For pain   What changed: reasons to take the med         STOP taking these medications         doxycycline 50 MG capsule      lisinopril 10 MG tablet          Where to get your medications    These are the prescriptions that you need to pick up.   You may get these medications from any pharmacy.         amLODipine 5 MG tablet   amoxicillin-clavulanate 875-125 MG per tablet   aspirin EC 81 MG tablet   metFORMIN 850 MG tablet   metoprolol  50 MG tablet   oxyCODONE-acetaminophen 5-325 MG per tablet             Total Time in preparing paper work, data evaluation and todays exam - 35 minutes  Leroy Sea M.D on 09/10/2011 at 11:29 AM  [facility] Office  [phone]

## 2011-09-10 NOTE — Progress Notes (Signed)
Pt discharged to homeless shelter via bus.  (Pt taken via wheelchair to bus stop on [address]).  Pt verbalizes understanding of discharge instructions although did exhibit anxiety due to situation.  Emotional support given.

## 2012-02-18 ENCOUNTER — Encounter (HOSPITAL_COMMUNITY): Payer: Self-pay | Admitting: *Deleted

## 2012-02-18 ENCOUNTER — Emergency Department (HOSPITAL_COMMUNITY)
Admission: EM | Admit: 2012-02-18 | Discharge: 2012-02-18 | Disposition: A | Discharge status: Home / Self Care | Payer: Self-pay | Attending: Emergency Medicine | Admitting: Emergency Medicine

## 2012-02-18 DIAGNOSIS — S98139A Complete traumatic amputation of one unspecified lesser toe, initial encounter: Secondary | ICD-10-CM | POA: Insufficient documentation

## 2012-02-18 DIAGNOSIS — E119 Type 2 diabetes mellitus without complications: Secondary | ICD-10-CM | POA: Insufficient documentation

## 2012-02-18 DIAGNOSIS — I70209 Unspecified atherosclerosis of native arteries of extremities, unspecified extremity: Secondary | ICD-10-CM | POA: Insufficient documentation

## 2012-02-18 DIAGNOSIS — R5381 Other malaise: Secondary | ICD-10-CM | POA: Insufficient documentation

## 2012-02-18 DIAGNOSIS — R609 Edema, unspecified: Secondary | ICD-10-CM | POA: Insufficient documentation

## 2012-02-18 DIAGNOSIS — N289 Disorder of kidney and ureter, unspecified: Secondary | ICD-10-CM

## 2012-02-18 LAB — COMPREHENSIVE METABOLIC PANEL
ALT: 11 U/L (ref 0–53)
AST: 17 U/L (ref 0–37)
Albumin: 2.4 g/dL — ABNORMAL LOW (ref 3.5–5.2)
Alkaline Phosphatase: 89 U/L (ref 39–117)
Potassium: 4 mEq/L (ref 3.5–5.1)
Sodium: 141 mEq/L (ref 135–145)
Total Protein: 6.8 g/dL (ref 6.0–8.3)

## 2012-02-18 LAB — CBC
MCH: 31.4 pg (ref 26.0–34.0)
MCHC: 34.7 g/dL (ref 30.0–36.0)
Platelets: 376 10*3/uL (ref 150–400)
RBC: 4.14 MIL/uL — ABNORMAL LOW (ref 4.22–5.81)

## 2012-02-18 LAB — DIFFERENTIAL
Basophils Relative: 1 % (ref 0–1)
Eosinophils Absolute: 0.3 10*3/uL (ref 0.0–0.7)
Lymphs Abs: 2.3 10*3/uL (ref 0.7–4.0)
Neutrophils Relative %: 67 % (ref 43–77)

## 2012-02-18 LAB — URINALYSIS, ROUTINE W REFLEX MICROSCOPIC
Bilirubin Urine: NEGATIVE
Glucose, UA: 100 mg/dL — AB
Ketones, ur: NEGATIVE mg/dL
pH: 7 (ref 5.0–8.0)

## 2012-02-18 LAB — URINE MICROSCOPIC-ADD ON

## 2012-02-18 NOTE — ED Provider Notes (Signed)
Medical screening exam: 51 year old male comes in complaining of generalized weakness. I was asked to evaluate him for possible stroke. He was observed to be having some slurred speech but on my exam, each is normal. He is complaining of some swelling in his legs. He is diabetic and had a toe amputation because of peripheral vascular disease. On exam, PERRLA, EOMI. There is no facial asymmetry and tongue protrudes in the midline. Lungs are clear and heart has regular rate and rhythm. Extremities have 2+ edema with venous stasis changes present. Neurologic exam shows normal cranial nerve function, and no motor deficits and normal speech. I see no evidence of acute stroke.  Dione Booze, MD 02/18/12 1353

## 2012-02-18 NOTE — Discharge Instructions (Signed)
Stop taking your lisinopril. Elevate your feet above your heart as much as possible. See your Dr. next week for further testing and treatment.   Edema Edema is an abnormal build-up of fluids in tissues. Because this is partly dependent on gravity (water flows to the lowest place), it is more common in the leg sand thighs (lower extremities). It is also common in the looser tissues, like around the eyes. Painless swelling of the feet and ankles is common and increases as a person ages. It may affect both legs and may include the calves or even thighs. When squeezed, the fluid may move out of the affected area and may leave a dent for a few moments. CAUSES   Prolonged standing or sitting in one place for extended periods of time. Movement helps pump tissue fluid into the veins, and absence of movement prevents this, resulting in edema.   Varicose veins. The valves in the veins do not work as well as they should. This causes fluid to leak into the tissues.   Fluid and salt overload.   Injury, burn, or surgery to the leg, ankle, or foot, may damage veins and allow fluid to leak out.   Sunburn damages vessels. Leaky vessels allow fluid to go out into the sunburned tissues.   Allergies (from insect bites or stings, medications or chemicals) cause swelling by allowing vessels to become leaky.   Protein in the blood helps keep fluid in your vessels. Low protein, as in malnutrition, allows fluid to leak out.   Hormonal changes, including pregnancy and menstruation, cause fluid retention. This fluid may leak out of vessels and cause edema.   Medications that cause fluid retention. Examples are sex hormones, blood pressure medications, steroid treatment, or anti-depressants.   Some illnesses cause edema, especially heart failure, kidney disease, or liver disease.   Surgery that cuts veins or lymph nodes, such as surgery done for the heart or for breast cancer, may result in edema.  DIAGNOSIS    Your caregiver is usually easily able to determine what is causing your swelling (edema) by simply asking what is wrong (getting a history) and examining you (doing a physical). Sometimes x-rays, EKG (electrocardiogram or heart tracing), and blood work may be done to evaluate for underlying medical illness. TREATMENT  General treatment includes:  Leg elevation (or elevation of the affected body part).   Restriction of fluid intake.   Prevention of fluid overload.   Compression of the affected body part. Compression with elastic bandages or support stockings squeezes the tissues, preventing fluid from entering and forcing it back into the blood vessels.   Diuretics (also called water pills or fluid pills) pull fluid out of your body in the form of increased urination. These are effective in reducing the swelling, but can have side effects and must be used only under your caregiver's supervision. Diuretics are appropriate only for some types of edema.  The specific treatment can be directed at any underlying causes discovered. Heart, liver, or kidney disease should be treated appropriately. HOME CARE INSTRUCTIONS   Elevate the legs (or affected body part) above the level of the heart, while lying down.   Avoid sitting or standing still for prolonged periods of time.   Avoid putting anything directly under the knees when lying down, and do not wear constricting clothing or garters on the upper legs.   Exercising the legs causes the fluid to work back into the veins and lymphatic channels. This may help the swelling  go down.   The pressure applied by elastic bandages or support stockings can help reduce ankle swelling.   A low-salt diet may help reduce fluid retention and decrease the ankle swelling.   Take any medications exactly as prescribed.  SEEK MEDICAL CARE IF:  Your edema is not responding to recommended treatments. SEEK IMMEDIATE MEDICAL CARE IF:   You develop shortness of  breath or chest pain.   You cannot breathe when you lay down; or if, while lying down, you have to get up and go to the window to get your breath.   You are having increasing swelling without relief from treatment.   You develop a fever over 102 F (38.9 C).   You develop pain or redness in the areas that are swollen.   Tell your caregiver right away if you have gained 3 lb/1.4 kg in 1 day or 5 lb/2.3 kg in a week.  MAKE SURE YOU:   Understand these instructions.   Will watch your condition.   Will get help right away if you are not doing well or get worse.  Document Released: 08/20/2005 Document Revised: 08/09/2011 Document Reviewed: 04/07/2008 [employer] [employer] Patient Information 2012 [place], [place].

## 2012-02-18 NOTE — ED Notes (Signed)
CBG 137  Rn notified [place]

## 2012-02-18 NOTE — ED Notes (Signed)
Discharged via [facility] 

## 2012-02-18 NOTE — ED Provider Notes (Signed)
History     CSN: [id number]  Arrival date & time 02/18/12  1250   First MD Initiated Contact with Patient 02/18/12 1816      Chief Complaint  Patient presents with  . Weakness    (Consider location/radiation/quality/duration/timing/severity/associated sxs/prior treatment) HPI Comments: [name] is a 51 y.o. Male who complains of gradually worsening swelling in the legs for 2 weeks. He also has a feeling of generalized weakness. He lives in a shelter. His metformin was recently increased. He denies chest pain, shortness of breath, back, pain, or dizziness. He does have occasional early morning nausea. He denies vomiting, or change in bowel habits.  Patient is a 51 y.o. male presenting with weakness. The history is provided by the patient.  Weakness  Additional symptoms include weakness.    Past Medical History  Diagnosis Date  . Diabetes mellitus     New onset    Past Surgical History  Procedure Date  . Leg surgery   . Knee surgery   . Amputation 09/06/2011    Procedure: AMPUTATION RAY;  Surgeon: Toni Arthurs, MD;  Location: [facility] OR;  Service: Orthopedics;  Laterality: Left;  Left Hallux Amputation    Family History  Problem Relation Age of Onset  . Adopted: Yes    History  Substance Use Topics  . Smoking status: Never Smoker   . Smokeless tobacco: Not on file  . Alcohol Use: No      Review of Systems  Neurological: Positive for weakness.  All other systems reviewed and are negative.    Allergies  Review of patient's allergies indicates no known allergies.  Home Medications   Current Outpatient Rx  Name Route Sig Dispense Refill  . AMLODIPINE BESYLATE 5 MG PO TABS Oral Take 5 mg by mouth daily.    Marland Kitchen GLIPIZIDE 10 MG PO TABS Oral Take 10-30 mg by mouth 2 (two) times daily. Takes 1 tablet in am and 3 tabs at lunch time    . LISINOPRIL 20 MG PO TABS Oral Take 20 mg by mouth daily.    Marland Kitchen METFORMIN HCL 1000 MG PO TABS Oral Take 1,000 mg by mouth 2 (two) times  daily with a meal.    . METOPROLOL TARTRATE 50 MG PO TABS Oral Take 1 tablet (50 mg total) by mouth 2 (two) times daily. 60 tablet 0    BP 129/88  Pulse 66  Temp 98.6 F (37 C) (Oral)  Resp 20  SpO2 99%  Physical Exam  Nursing note and vitals reviewed. Constitutional: He is oriented to person, place, and time. He appears well-developed and well-nourished.  HENT:  Head: Normocephalic and atraumatic.  Right Ear: External ear normal.  Left Ear: External ear normal.  Eyes: Conjunctivae and EOM are normal. Pupils are equal, round, and reactive to light.  Neck: Normal range of motion and phonation normal. Neck supple.  Cardiovascular: Normal rate, regular rhythm, normal heart sounds and intact distal pulses.   Pulmonary/Chest: Effort normal and breath sounds normal. He exhibits no bony tenderness.  Abdominal: Soft. Normal appearance. There is no tenderness.  Musculoskeletal: Normal range of motion. He exhibits edema (2+, bilateral lower extremity swelling). He exhibits no tenderness.  Neurological: He is alert and oriented to person, place, and time. He has normal strength. No cranial nerve deficit or sensory deficit. He exhibits normal muscle tone. Coordination normal.  Skin: Skin is warm, dry and intact.  Psychiatric: He has a normal mood and affect. His behavior is normal. Judgment and thought  content normal.    ED Course  Procedures (including critical care time)  Labs Reviewed  GLUCOSE, CAPILLARY - Abnormal; Notable for the following:    Glucose-Capillary 137 (*)     All other components within normal limits  CBC - Abnormal; Notable for the following:    RBC 4.14 (*)     HCT 37.5 (*)     All other components within normal limits  COMPREHENSIVE METABOLIC PANEL - Abnormal; Notable for the following:    Glucose, Bld 117 (*)     Creatinine, Ser 1.60 (*)     Albumin 2.4 (*)     GFR calc non Af Amer 49 (*)     GFR calc Af Amer 56 (*)     All other components within normal  limits  DIFFERENTIAL  URINALYSIS, ROUTINE W REFLEX MICROSCOPIC   No results found.   No diagnosis found.    MDM  Peripheral edema, without shortness of breath, or abnormal vital signs. Pulse oxygenation is normal. Suspect multi-factorial edema. Doubt PE, ACS, pneumonia, metabolic instability, or occult infection. Patient has renal insufficiency, worse than his baseline. He is on an ACE inhibitor. Adding a diuretic would worsen his renal failure. Will initiate treatment with stopping lisinopril, encourage extremity elevation, and have him followup with his PCP in one week for repeat testing, and treatment.   Plan: Home Medications- usual; Home Treatments- elevate feet ; Recommended follow up- PCP 1 week        Flint Melter, MD 02/18/12 1851

## 2012-02-18 NOTE — ED Notes (Signed)
Pt states started having bilateral leg swelling couple of weeks ago with red dot--dots disappeared.  Pt is diabetic.  [facility] sent patient here.  Pt states that he has been feeling weak off and on for the last hour and speech sounds slurred.  Pt doesnot take insulin.  No facial droop and no extremity deficits.  Pt states that he has been taking a new medication and after taking it it makes him feel this way every day adn states that she upped his metformin

## 2012-07-23 ENCOUNTER — Emergency Department (INDEPENDENT_AMBULATORY_CARE_PROVIDER_SITE_OTHER)
Admission: EM | Admit: 2012-07-23 | Discharge: 2012-07-23 | Disposition: A | Discharge status: Home / Self Care | Payer: Self-pay | Source: Home / Self Care | Attending: Emergency Medicine | Admitting: Emergency Medicine

## 2012-07-23 ENCOUNTER — Encounter (HOSPITAL_COMMUNITY): Payer: Self-pay

## 2012-07-23 DIAGNOSIS — E119 Type 2 diabetes mellitus without complications: Secondary | ICD-10-CM

## 2012-07-23 DIAGNOSIS — E162 Hypoglycemia, unspecified: Secondary | ICD-10-CM

## 2012-07-23 DIAGNOSIS — I10 Essential (primary) hypertension: Secondary | ICD-10-CM

## 2012-07-23 HISTORY — DX: Essential (primary) hypertension: I10

## 2012-07-23 LAB — POCT URINALYSIS DIP (DEVICE)
Bilirubin Urine: NEGATIVE
Glucose, UA: 100 mg/dL — AB
Ketones, ur: NEGATIVE mg/dL
Nitrite: NEGATIVE
Specific Gravity, Urine: 1.025 (ref 1.005–1.030)

## 2012-07-23 LAB — POCT I-STAT, CHEM 8
BUN: 14 mg/dL (ref 6–23)
Chloride: 107 mEq/L (ref 96–112)
Glucose, Bld: 54 mg/dL — ABNORMAL LOW (ref 70–99)
HCT: 40 % (ref 39.0–52.0)
Potassium: 3.9 mEq/L (ref 3.5–5.1)

## 2012-07-23 LAB — HEMOGLOBIN A1C: Mean Plasma Glucose: 134 mg/dL — ABNORMAL HIGH (ref <117)

## 2012-07-23 MED ORDER — HYDROCHLOROTHIAZIDE 25 MG PO TABS: 25.0000 mg | ORAL_TABLET | Freq: Every day | ORAL | Status: DC

## 2012-07-23 MED ORDER — ASPIRIN 81 MG PO CHEW: 81.0000 mg | CHEWABLE_TABLET | ORAL | Status: DC

## 2012-07-23 MED ORDER — METFORMIN HCL 500 MG PO TABS: 500.0000 mg | ORAL_TABLET | Freq: Two times a day (BID) | ORAL | Status: DC

## 2012-07-23 MED ORDER — LISINOPRIL 5 MG PO TABS: 5.0000 mg | ORAL_TABLET | Freq: Every day | ORAL | Status: DC

## 2012-07-23 NOTE — ED Notes (Signed)
C/o his BP is high and his legs are swollen bilateral (pitting edema to knees) sent for evaluation

## 2012-07-23 NOTE — ED Provider Notes (Signed)
History     CSN: [id number]  Arrival date & time 07/23/12  1005   First MD Initiated Contact with Patient 07/23/12 1123      Chief Complaint  Patient presents with  . Hypertension    (Consider location/radiation/quality/duration/timing/severity/associated sxs/prior treatment) HPI Comments: Patient is sent to urgent care today by a site  Nurse, as a result of his uncontrolled blood pressure. He describes that his blood pressures have been high for several months. In his legs continue to be swollen. He does describe it occasionally feels tired and fatigued and sometimes even dizzy. He denies any chest pain shortness of breath or exertional dyspnea. Patient describes that he is taking 3 different medicines for his diabetes and to medicines for his blood pressure he is also aware as he was told during a previous emergency department visit that he has a certain degree of renal failure. Patient is currently homeless and is receiving care through the nurses at his homeless site.  The history is provided by the patient.    Past Medical History  Diagnosis Date  . Diabetes mellitus     New onset  . Hypertension     Past Surgical History  Procedure Date  . Leg surgery   . Knee surgery   . Amputation 09/06/2011    Procedure: AMPUTATION RAY;  Surgeon: Toni Arthurs, MD;  Location: [facility] OR;  Service: Orthopedics;  Laterality: Left;  Left Hallux Amputation    Family History  Problem Relation Age of Onset  . Adopted: Yes    History  Substance Use Topics  . Smoking status: Never Smoker   . Smokeless tobacco: Not on file  . Alcohol Use: No      Review of Systems  Constitutional: Positive for fatigue. Negative for fever, chills, diaphoresis, activity change, appetite change and unexpected weight change.  HENT: Negative for neck stiffness.   Respiratory: Negative for cough and shortness of breath.   Cardiovascular: Negative for chest pain and palpitations.  Musculoskeletal: Negative for  gait problem.  Skin: Negative for rash.  Neurological: Positive for dizziness. Negative for tremors, weakness, numbness and headaches.    Allergies  Review of patient's allergies indicates no known allergies.  Home Medications   Current Outpatient Rx  Name  Route  Sig  Dispense  Refill  . METFORMIN HCL 1000 MG PO TABS   Oral   Take 1,000 mg by mouth 2 (two) times daily with a meal.         . METOPROLOL TARTRATE 50 MG PO TABS   Oral   Take 1 tablet (50 mg total) by mouth 2 (two) times daily.   60 tablet   0   . ASPIRIN 81 MG PO CHEW   Oral   Chew 1 tablet (81 mg total) by mouth 3 (three) times a week.   30 tablet   0   . HYDROCHLOROTHIAZIDE 25 MG PO TABS   Oral   Take 1 tablet (25 mg total) by mouth daily.   30 tablet   0   . LISINOPRIL 5 MG PO TABS   Oral   Take 1 tablet (5 mg total) by mouth daily.   30 tablet   0   . METFORMIN HCL 500 MG PO TABS   Oral   Take 1 tablet (500 mg total) by mouth 2 (two) times daily with a meal.   500 tablet   0     BP 175/91  Pulse 63  Temp 97.4 F (36.3  C) (Oral)  Resp 20  SpO2 100%  Physical Exam  Nursing note and vitals reviewed. Constitutional: He is oriented to person, place, and time. He appears well-nourished.  Non-toxic appearance. He does not have a sickly appearance. He does not appear ill. No distress.  HENT:  Head: Normocephalic.  Eyes: Conjunctivae normal are normal.  Neck: Neck supple. No JVD present.  Cardiovascular: Normal rate, regular rhythm and normal heart sounds.  PMI is not displaced.  Exam reveals no gallop and no friction rub.   No murmur heard. Pulmonary/Chest: Effort normal.  Musculoskeletal: He exhibits no tenderness.       Legs: Lymphadenopathy:    He has no cervical adenopathy.  Neurological: He is alert and oriented to person, place, and time.  Skin: No erythema. No pallor.    ED Course  Procedures (including critical care time)  Labs Reviewed  POCT URINALYSIS DIP (DEVICE) -  Abnormal; Notable for the following:    Glucose, UA 100 (*)     Hgb urine dipstick MODERATE (*)     Protein, ur >=300 (*)     All other components within normal limits  POCT I-STAT, CHEM 8 - Abnormal; Notable for the following:    Creatinine, Ser 1.60 (*)     Glucose, Bld 54 (*)     Calcium, Ion 1.31 (*)     All other components within normal limits  HEMOGLOBIN A1C   No results found.   1. Hypertension   2. Hypoglycemia   3. Diabetes       MDM  Patient presents to urgent care with several problems sent from [facility].   Problem #1 Hypertension, patient is currently taking a beta blocker. Calcium channel blocker reports she's been taking this medicines for several weeks under the guidance of a nurse and his blood pressures are never under 160/90. Today, we discontinued AMLODIPINE- probably insufficient for blood pressure control goals and most likely contributing to lower extremity edema. Patient started on hydrochlorothiazide 25 mg and lisinopril 20 mg. He was specifically advised to return in a time frame of one to 2 weeks for electrolytes monitoring and vigilance and creatinine. The patient has a baseline creatinine of 1.6 suggesting a mild renal insufficiency. We opted for an ACE inhibitor under close monitoring of his kidney function. Patient has marked glucosuria and proteinuria along with his diabetes and hypertension have considered the renal protective effects of an ACE inhibitor despite his borderline renal function abnormality.  Problem #2 patient was taking to oral antihyperglycemic with poor supervision, describing recurrent symptomatology that included fatigue, dizziness and swelling of his lower extremities. Today's spot glucose check was 56, noting hypoglycemia. Was unable to retrieve any previous hemoglobin A1c's. Decided to discontinue glipizide as notorious for causing hypoglycemic events, also reduce his metformin to 1000 mg per day versus 2000 mg daily that he was  previously taking. Today's labs included a baseline hemoglobin A1c.  Problem #3 lack of continuity of care. Patient will be seen again by his nurse, which they have been providing monitoring of his blood pressure. I have encouraged him to establish care with the adult clinical provider that has been instituted at urgent care until the beginning of the adult: Clinic ( for indigent or on [facility] patient's).   Jimmie Molly, MD 07/23/12 [phone]

## 2012-07-25 ENCOUNTER — Telehealth (HOSPITAL_COMMUNITY): Payer: Self-pay | Admitting: *Deleted

## 2012-07-25 NOTE — ED Notes (Signed)
11/20 Hgb A1C 6.3 H, Mean Plasma Glucose 134 H.  Message sent to Dr. Ladon Applebaum. 11/21 He said to call pt. and congratulate him.   Tell him he is doing much better with his sugars and remind him to follow- up with PCP or the new Adult Clinic.  11/22 I called pt.  Pt. verified x 2 and given results.  Pt. given doctors message. Pt. voiced understanding. [name] 07/25/2012

## 2013-09-03 DIAGNOSIS — I10 Essential (primary) hypertension: Secondary | ICD-10-CM

## 2013-09-03 DIAGNOSIS — E119 Type 2 diabetes mellitus without complications: Secondary | ICD-10-CM

## 2013-09-03 HISTORY — DX: Essential (primary) hypertension: I10

## 2013-09-03 HISTORY — DX: Type 2 diabetes mellitus without complications: E11.9

## 2013-09-06 ENCOUNTER — Encounter (HOSPITAL_COMMUNITY): Payer: Self-pay | Admitting: *Deleted

## 2013-09-06 ENCOUNTER — Inpatient Hospital Stay (HOSPITAL_COMMUNITY)
Admission: EM | Admit: 2013-09-06 | Discharge: 2013-09-10 | DRG: 292 | Disposition: A | Discharge status: Home / Self Care | Payer: No Typology Code available for payment source | Attending: Internal Medicine | Admitting: Internal Medicine

## 2013-09-06 ENCOUNTER — Emergency Department (HOSPITAL_COMMUNITY): Payer: No Typology Code available for payment source

## 2013-09-06 DIAGNOSIS — S98119A Complete traumatic amputation of unspecified great toe, initial encounter: Secondary | ICD-10-CM

## 2013-09-06 DIAGNOSIS — R609 Edema, unspecified: Secondary | ICD-10-CM

## 2013-09-06 DIAGNOSIS — L03119 Cellulitis of unspecified part of limb: Secondary | ICD-10-CM

## 2013-09-06 DIAGNOSIS — N179 Acute kidney failure, unspecified: Secondary | ICD-10-CM

## 2013-09-06 DIAGNOSIS — E119 Type 2 diabetes mellitus without complications: Secondary | ICD-10-CM

## 2013-09-06 DIAGNOSIS — I129 Hypertensive chronic kidney disease with stage 1 through stage 4 chronic kidney disease, or unspecified chronic kidney disease: Secondary | ICD-10-CM | POA: Diagnosis present

## 2013-09-06 DIAGNOSIS — I5043 Acute on chronic combined systolic (congestive) and diastolic (congestive) heart failure: Principal | ICD-10-CM | POA: Diagnosis present

## 2013-09-06 DIAGNOSIS — E1129 Type 2 diabetes mellitus with other diabetic kidney complication: Secondary | ICD-10-CM

## 2013-09-06 DIAGNOSIS — L02619 Cutaneous abscess of unspecified foot: Secondary | ICD-10-CM

## 2013-09-06 DIAGNOSIS — D649 Anemia, unspecified: Secondary | ICD-10-CM

## 2013-09-06 DIAGNOSIS — D72829 Elevated white blood cell count, unspecified: Secondary | ICD-10-CM | POA: Diagnosis present

## 2013-09-06 DIAGNOSIS — I509 Heart failure, unspecified: Secondary | ICD-10-CM

## 2013-09-06 DIAGNOSIS — N184 Chronic kidney disease, stage 4 (severe): Secondary | ICD-10-CM | POA: Diagnosis present

## 2013-09-06 DIAGNOSIS — I5031 Acute diastolic (congestive) heart failure: Secondary | ICD-10-CM

## 2013-09-06 DIAGNOSIS — Z5900 Homelessness unspecified: Secondary | ICD-10-CM

## 2013-09-06 DIAGNOSIS — D631 Anemia in chronic kidney disease: Secondary | ICD-10-CM | POA: Diagnosis present

## 2013-09-06 DIAGNOSIS — I10 Essential (primary) hypertension: Secondary | ICD-10-CM

## 2013-09-06 DIAGNOSIS — R600 Localized edema: Secondary | ICD-10-CM

## 2013-09-06 DIAGNOSIS — N039 Chronic nephritic syndrome with unspecified morphologic changes: Secondary | ICD-10-CM

## 2013-09-06 DIAGNOSIS — Z79899 Other long term (current) drug therapy: Secondary | ICD-10-CM

## 2013-09-06 DIAGNOSIS — IMO0001 Reserved for inherently not codable concepts without codable children: Secondary | ICD-10-CM | POA: Diagnosis present

## 2013-09-06 DIAGNOSIS — E785 Hyperlipidemia, unspecified: Secondary | ICD-10-CM | POA: Diagnosis present

## 2013-09-06 DIAGNOSIS — Z23 Encounter for immunization: Secondary | ICD-10-CM

## 2013-09-06 LAB — HEMOGLOBIN A1C
Hgb A1c MFr Bld: 7 % — ABNORMAL HIGH (ref <5.7)
Mean Plasma Glucose: 154 mg/dL — ABNORMAL HIGH (ref <117)

## 2013-09-06 LAB — INFLUENZA PANEL BY PCR (TYPE A & B)
H1N1 flu by pcr: NOT DETECTED
INFLAPCR: NEGATIVE
INFLBPCR: NEGATIVE

## 2013-09-06 LAB — COMPREHENSIVE METABOLIC PANEL
ALBUMIN: 2.3 g/dL — AB (ref 3.5–5.2)
ALT: 22 U/L (ref 0–53)
AST: 24 U/L (ref 0–37)
Alkaline Phosphatase: 116 U/L (ref 39–117)
BUN: 26 mg/dL — AB (ref 6–23)
CALCIUM: 8.5 mg/dL (ref 8.4–10.5)
CO2: 23 mEq/L (ref 19–32)
CREATININE: 3.17 mg/dL — AB (ref 0.50–1.35)
Chloride: 104 mEq/L (ref 96–112)
GFR calc non Af Amer: 21 mL/min — ABNORMAL LOW (ref >90)
GFR, EST AFRICAN AMERICAN: 24 mL/min — AB (ref >90)
GLUCOSE: 176 mg/dL — AB (ref 70–99)
POTASSIUM: 3.7 meq/L (ref 3.7–5.3)
Sodium: 140 mEq/L (ref 137–147)
TOTAL PROTEIN: 7 g/dL (ref 6.0–8.3)
Total Bilirubin: 0.4 mg/dL (ref 0.3–1.2)

## 2013-09-06 LAB — CG4 I-STAT (LACTIC ACID): LACTIC ACID, VENOUS: 1.54 mmol/L (ref 0.5–2.2)

## 2013-09-06 LAB — CBC WITH DIFFERENTIAL/PLATELET
BASOS ABS: 0.1 10*3/uL (ref 0.0–0.1)
BASOS PCT: 0 % (ref 0–1)
EOS ABS: 0.5 10*3/uL (ref 0.0–0.7)
EOS PCT: 4 % (ref 0–5)
HCT: 34.6 % — ABNORMAL LOW (ref 39.0–52.0)
Hemoglobin: 11.4 g/dL — ABNORMAL LOW (ref 13.0–17.0)
LYMPHS ABS: 3.3 10*3/uL (ref 0.7–4.0)
Lymphocytes Relative: 25 % (ref 12–46)
MCH: 30.9 pg (ref 26.0–34.0)
MCHC: 32.9 g/dL (ref 30.0–36.0)
MCV: 93.8 fL (ref 78.0–100.0)
Monocytes Absolute: 1 10*3/uL (ref 0.1–1.0)
Monocytes Relative: 8 % (ref 3–12)
NEUTROS PCT: 63 % (ref 43–77)
Neutro Abs: 8.1 10*3/uL — ABNORMAL HIGH (ref 1.7–7.7)
PLATELETS: 493 10*3/uL — AB (ref 150–400)
RBC: 3.69 MIL/uL — AB (ref 4.22–5.81)
RDW: 13.7 % (ref 11.5–15.5)
WBC: 12.9 10*3/uL — ABNORMAL HIGH (ref 4.0–10.5)

## 2013-09-06 LAB — TROPONIN I
Troponin I: <0.3 ng/mL (ref <0.30)
Troponin I: <0.3 ng/mL (ref <0.30)
Troponin I: <0.3 ng/mL (ref <0.30)

## 2013-09-06 LAB — URINALYSIS, ROUTINE W REFLEX MICROSCOPIC
Bilirubin Urine: NEGATIVE
GLUCOSE, UA: 100 mg/dL — AB
Hgb urine dipstick: NEGATIVE
Ketones, ur: NEGATIVE mg/dL
LEUKOCYTES UA: NEGATIVE
Nitrite: NEGATIVE
PH: 6 (ref 5.0–8.0)
Protein, ur: 100 mg/dL — AB
Specific Gravity, Urine: 1.006 (ref 1.005–1.030)
Urobilinogen, UA: 0.2 mg/dL (ref 0.0–1.0)

## 2013-09-06 LAB — LIPID PANEL
Cholesterol: 127 mg/dL (ref 0–200)
HDL: 38 mg/dL — ABNORMAL LOW (ref >39)
LDL Cholesterol: 53 mg/dL (ref 0–99)
Total CHOL/HDL Ratio: 3.3 RATIO
Triglycerides: 178 mg/dL — ABNORMAL HIGH (ref <150)
VLDL: 36 mg/dL (ref 0–40)

## 2013-09-06 LAB — GLUCOSE, CAPILLARY
GLUCOSE-CAPILLARY: 187 mg/dL — AB (ref 70–99)
GLUCOSE-CAPILLARY: 123 mg/dL — AB (ref 70–99)
Glucose-Capillary: 156 mg/dL — ABNORMAL HIGH (ref 70–99)
Glucose-Capillary: 127 mg/dL — ABNORMAL HIGH (ref 70–99)

## 2013-09-06 LAB — URINE MICROSCOPIC-ADD ON

## 2013-09-06 LAB — POCT I-STAT TROPONIN I: Troponin i, poc: 0.03 ng/mL (ref 0.00–0.08)

## 2013-09-06 LAB — TSH: TSH: 1.678 u[IU]/mL (ref 0.350–4.500)

## 2013-09-06 LAB — PRO B NATRIURETIC PEPTIDE: PRO B NATRI PEPTIDE: 6074 pg/mL — AB (ref 0–125)

## 2013-09-06 LAB — PROCALCITONIN: Procalcitonin: 0.31 ng/mL

## 2013-09-06 MED ORDER — ALBUTEROL SULFATE (2.5 MG/3ML) 0.083% IN NEBU: 2.5000 mg | INHALATION_SOLUTION | Freq: Four times a day (QID) | RESPIRATORY_TRACT | Status: DC

## 2013-09-06 MED ORDER — ISOSORBIDE DINITRATE 30 MG PO TABS
30.0000 mg | ORAL_TABLET | Freq: Three times a day (TID) | ORAL | Status: DC
  Administered 2013-09-06 – 2013-09-10 (×13): 30 mg via ORAL
  Filled 2013-09-06 (×15): qty 1

## 2013-09-06 MED ORDER — ALBUTEROL (5 MG/ML) CONTINUOUS INHALATION SOLN
15.0000 mg/h | INHALATION_SOLUTION | Freq: Once | RESPIRATORY_TRACT | Status: AC
  Administered 2013-09-06: 15 mg/h via RESPIRATORY_TRACT
  Filled 2013-09-06: qty 20

## 2013-09-06 MED ORDER — ACETAMINOPHEN 650 MG RE SUPP: 650.0000 mg | Freq: Four times a day (QID) | RECTAL | Status: DC | PRN

## 2013-09-06 MED ORDER — SODIUM CHLORIDE 0.9 % IV BOLUS (SEPSIS): 1000.0000 mL | Freq: Once | INTRAVENOUS | Status: DC

## 2013-09-06 MED ORDER — HYDRALAZINE HCL 20 MG/ML IJ SOLN: 10.0000 mg | INTRAMUSCULAR | Status: DC | PRN

## 2013-09-06 MED ORDER — IPRATROPIUM BROMIDE 0.02 % IN SOLN: 0.5000 mg | Freq: Four times a day (QID) | RESPIRATORY_TRACT | Status: DC

## 2013-09-06 MED ORDER — INSULIN ASPART 100 UNIT/ML ~~LOC~~ SOLN
0.0000 [IU] | Freq: Every day | SUBCUTANEOUS | Status: DC
  Administered 2013-09-09: 2 [IU] via SUBCUTANEOUS

## 2013-09-06 MED ORDER — METOPROLOL SUCCINATE ER 25 MG PO TB24
25.0000 mg | ORAL_TABLET | Freq: Every day | ORAL | Status: DC
  Administered 2013-09-06 – 2013-09-10 (×5): 25 mg via ORAL
  Filled 2013-09-06 (×5): qty 1

## 2013-09-06 MED ORDER — INSULIN ASPART 100 UNIT/ML ~~LOC~~ SOLN
0.0000 [IU] | Freq: Three times a day (TID) | SUBCUTANEOUS | Status: DC
  Administered 2013-09-06: 3 [IU] via SUBCUTANEOUS
  Administered 2013-09-06: 2 [IU] via SUBCUTANEOUS
  Administered 2013-09-07: 3 [IU] via SUBCUTANEOUS
  Administered 2013-09-09: 2 [IU] via SUBCUTANEOUS
  Administered 2013-09-09: 1 [IU] via SUBCUTANEOUS
  Administered 2013-09-09: 3 [IU] via SUBCUTANEOUS

## 2013-09-06 MED ORDER — ALBUTEROL SULFATE (2.5 MG/3ML) 0.083% IN NEBU: 2.5000 mg | INHALATION_SOLUTION | RESPIRATORY_TRACT | Status: DC | PRN

## 2013-09-06 MED ORDER — HEPARIN SODIUM (PORCINE) 5000 UNIT/ML IJ SOLN
5000.0000 [IU] | Freq: Three times a day (TID) | INTRAMUSCULAR | Status: DC
Start: 2013-09-06 — End: 2013-09-10
  Administered 2013-09-06 – 2013-09-10 (×11): 5000 [IU] via SUBCUTANEOUS
  Filled 2013-09-06 (×16): qty 1

## 2013-09-06 MED ORDER — GUAIFENESIN-DM 100-10 MG/5ML PO SYRP: 5.0000 mL | ORAL_SOLUTION | ORAL | Status: DC | PRN

## 2013-09-06 MED ORDER — ALBUTEROL SULFATE (2.5 MG/3ML) 0.083% IN NEBU
2.5000 mg | INHALATION_SOLUTION | Freq: Four times a day (QID) | RESPIRATORY_TRACT | Status: DC
  Administered 2013-09-06 – 2013-09-07 (×3): 2.5 mg via RESPIRATORY_TRACT
  Filled 2013-09-06 (×7): qty 3

## 2013-09-06 MED ORDER — FUROSEMIDE 10 MG/ML IJ SOLN
40.0000 mg | Freq: Every day | INTRAMUSCULAR | Status: DC
  Administered 2013-09-06 – 2013-09-09 (×4): 40 mg via INTRAVENOUS
  Filled 2013-09-06 (×4): qty 4

## 2013-09-06 MED ORDER — FUROSEMIDE 10 MG/ML IJ SOLN
40.0000 mg | Freq: Once | INTRAMUSCULAR | Status: AC
  Administered 2013-09-06: 40 mg via INTRAVENOUS
  Filled 2013-09-06: qty 4

## 2013-09-06 MED ORDER — PNEUMOCOCCAL VAC POLYVALENT 25 MCG/0.5ML IJ INJ
0.5000 mL | INJECTION | INTRAMUSCULAR | Status: AC
  Administered 2013-09-08: 0.5 mL via INTRAMUSCULAR
  Filled 2013-09-06 (×2): qty 0.5

## 2013-09-06 MED ORDER — IPRATROPIUM-ALBUTEROL 0.5-2.5 (3) MG/3ML IN SOLN
3.0000 mL | Freq: Four times a day (QID) | RESPIRATORY_TRACT | Status: DC
  Administered 2013-09-06: 3 mL via RESPIRATORY_TRACT
  Filled 2013-09-06: qty 3

## 2013-09-06 MED ORDER — HYDRALAZINE HCL 25 MG PO TABS
25.0000 mg | ORAL_TABLET | Freq: Three times a day (TID) | ORAL | Status: DC
  Administered 2013-09-06 – 2013-09-08 (×7): 25 mg via ORAL
  Filled 2013-09-06 (×10): qty 1

## 2013-09-06 MED ORDER — ACETAMINOPHEN 325 MG PO TABS
650.0000 mg | ORAL_TABLET | Freq: Four times a day (QID) | ORAL | Status: DC | PRN
  Administered 2013-09-06: 650 mg via ORAL
  Filled 2013-09-06: qty 2

## 2013-09-06 MED ORDER — SODIUM CHLORIDE 0.9 % IJ SOLN
3.0000 mL | Freq: Two times a day (BID) | INTRAMUSCULAR | Status: DC
  Administered 2013-09-06 – 2013-09-09 (×6): 3 mL via INTRAVENOUS

## 2013-09-06 MED ORDER — DEXTROSE 5 % IV SOLN
1.0000 g | INTRAVENOUS | Status: DC
  Administered 2013-09-06 – 2013-09-08 (×3): 1 g via INTRAVENOUS
  Filled 2013-09-06 (×3): qty 10

## 2013-09-06 MED ORDER — IPRATROPIUM-ALBUTEROL 0.5-2.5 (3) MG/3ML IN SOLN: 3.0000 mL | RESPIRATORY_TRACT | Status: DC | PRN

## 2013-09-06 MED ORDER — IPRATROPIUM-ALBUTEROL 0.5-2.5 (3) MG/3ML IN SOLN: 3.0000 mL | RESPIRATORY_TRACT | Status: DC

## 2013-09-06 NOTE — ED Provider Notes (Addendum)
CSN: [id number]     Arrival date & time 09/06/13  0347 History   First MD Initiated Contact with Patient 09/06/13 0405     Chief Complaint  Patient presents with  . Shortness of Breath   (Consider location/radiation/quality/duration/timing/severity/associated sxs/prior Treatment) The history is provided by the patient. The history is limited by the condition of the patient.  [name] is a 53 y.o. male hx of DM, HTN here with SOB. Having some nonproductive cough for a week. Also some leg swelling. This evening had some trouble breathing. Had some low-grade temperature at home. Denies any abdominal pain or vomiting.    Level V caveat- respiratory distress  Past Medical History  Diagnosis Date  . Diabetes mellitus     New onset  . Hypertension    Past Surgical History  Procedure Laterality Date  . Leg surgery    . Knee surgery    . Amputation  09/06/2011    Procedure: AMPUTATION RAY;  Surgeon: Wylene Simmer, MD;  Location: [place];  Service: Orthopedics;  Laterality: Left;  Left Hallux Amputation   Family History  Problem Relation Age of Onset  . Adopted: Yes   History  Substance Use Topics  . Smoking status: Never Smoker   . Smokeless tobacco: Not on file  . Alcohol Use: No    Review of Systems  Respiratory: Positive for shortness of breath.   All other systems reviewed and are negative.    Allergies  Review of patient's allergies indicates no known allergies.  Home Medications   Current Outpatient Rx  Name  Route  Sig  Dispense  Refill  . amLODipine (NORVASC) 10 MG tablet   Oral   Take 10 mg by mouth daily.         Marland Kitchen glipiZIDE (GLUCOTROL) 10 MG tablet   Oral   Take 10 mg by mouth 2 (two) times daily before a meal.         . hydrochlorothiazide (HYDRODIURIL) 25 MG tablet   Oral   Take 1 tablet (25 mg total) by mouth daily.   30 tablet   0   . metFORMIN (GLUCOPHAGE) 1000 MG tablet   Oral   Take 1,000 mg by mouth 2 (two) times daily with a meal.          . metoprolol (LOPRESSOR) 50 MG tablet   Oral   Take 50 mg by mouth 2 (two) times daily.         Marland Kitchen omega-3 acid ethyl esters (LOVAZA) 1 G capsule   Oral   Take 1 g by mouth 2 (two) times daily.          BP 159/86  Pulse 81  Temp(Src) 96.7 F (35.9 C) (Axillary)  Resp 17  SpO2 100% Physical Exam  Nursing note and vitals reviewed. Constitutional: He is oriented to person, place, and time.  Tachypneic, uncomfortable, pale   HENT:  Head: Normocephalic.  Mouth/Throat: Oropharynx is clear and moist.  Eyes: Conjunctivae are normal. Pupils are equal, round, and reactive to light.  Neck: Normal range of motion. Neck supple.  Cardiovascular: Normal rate, regular rhythm and normal heart sounds.   Pulmonary/Chest:  Tachypneic, talking in 2-3 word sentences. + retractions. ? Crackles, mod air movement   Abdominal: Soft. Bowel sounds are normal. He exhibits no distension. There is no tenderness. There is no rebound.  Musculoskeletal: Normal range of motion. He exhibits no edema.  Neurological: He is alert and oriented to person, place, and time.  Skin: Skin is warm and dry.  Psychiatric: He has a normal mood and affect. His behavior is normal. Judgment and thought content normal.    ED Course  Procedures (including critical care time)  CRITICAL CARE Performed by: Darl Householder, Eunice Oldaker   Total critical care time: 45 min   Critical care time was exclusive of separately billable procedures and treating other patients.  Critical care was necessary to treat or prevent imminent or life-threatening deterioration.  Critical care was time spent personally by me on the following activities: development of treatment plan with patient and/or surrogate as well as nursing, discussions with consultants, evaluation of patient's response to treatment, examination of patient, obtaining history from patient or surrogate, ordering and performing treatments and interventions, ordering and review of  laboratory studies, ordering and review of radiographic studies, pulse oximetry and re-evaluation of patient's condition.   Labs Review Labs Reviewed  CBC WITH DIFFERENTIAL - Abnormal; Notable for the following:    WBC 12.9 (*)    RBC 3.69 (*)    Hemoglobin 11.4 (*)    HCT 34.6 (*)    Platelets 493 (*)    Neutro Abs 8.1 (*)    All other components within normal limits  COMPREHENSIVE METABOLIC PANEL - Abnormal; Notable for the following:    Glucose, Bld 176 (*)    BUN 26 (*)    Creatinine, Ser 3.17 (*)    Albumin 2.3 (*)    GFR calc non Af Amer 21 (*)    GFR calc Af Amer 24 (*)    All other components within normal limits  PRO B NATRIURETIC PEPTIDE - Abnormal; Notable for the following:    Pro B Natriuretic peptide (BNP) 6074.0 (*)    All other components within normal limits  GLUCOSE, CAPILLARY - Abnormal; Notable for the following:    Glucose-Capillary 187 (*)    All other components within normal limits  CULTURE, BLOOD (ROUTINE X 2)  CULTURE, BLOOD (ROUTINE X 2)  INFLUENZA PANEL BY PCR  CG4 I-STAT (LACTIC ACID)  POCT I-STAT TROPONIN I   Imaging Review Dg Chest Port 1 View  09/06/2013   CLINICAL DATA:  Shortness of breath  EXAM: PORTABLE CHEST - 1 VIEW  COMPARISON:  None available  FINDINGS: The transverse heart size is at the upper limits of normal. Mediastinal silhouette within normal limits.  Lungs are hypoinflated. Chair is diffuse pulmonary vascular congestion with interstitial thickening, consistent with pulmonary edema. Small bilateral pleural effusions are present, left slightly larger than right. No definite focal infiltrates identified. No pneumothorax.  No acute osseous abnormality.  IMPRESSION: Moderate diffuse pulmonary edema with small bilateral pleural effusions.   Electronically Signed   By: Jeannine Boga M.D.   On: 09/06/2013 05:39    EKG Interpretation    Date/Time:  Sunday September 06 2013 03:55:32 EST Ventricular Rate:  93 PR Interval:  134 QRS  Duration: 82 QT Interval:  374 QTC Calculation: 465 R Axis:   80 Text Interpretation:  Normal sinus rhythm Cannot rule out Anterior infarct , age undetermined Abnormal ECG No previous ECGs available Confirmed by Dillyn Joaquin  MD, Khayman Kirsch [phone]) on 09/06/2013 4:10:21 AM            MDM  No diagnosis found. [name] is a 53 y.o. male here with SOB, cough. He is hypoxic to 70s on RA and very tachypneic. Likely flu vs pneumonia vs CHF. He is in distress so will place on bipap and get sepsis work up and reassess.  6:04 AM BNP elevated. Comfortable on bipap. Will try and wean off. Given lasix for diuresis. CXR showed pulm edema. Will admit for new onset CHF. Also has acute on chronic renal failure, likely from CHF.     Wandra Arthurs, MD 09/06/13 8242  Wandra Arthurs, MD 09/06/13 [phone]

## 2013-09-06 NOTE — ED Notes (Signed)
Lactic Acid reported to Dr.Yao

## 2013-09-06 NOTE — H&P (Signed)
[facility] History and Physical  Patient: [name]  [id number]  DOB: 09/14/1960  DOS: the patient was seen and examined on 09/06/2013 PCP: Arnoldo Morale, MD  Chief Complaint: Shortness of breath  HPI: [name] is a 53 y.o. male with Past medical history of diabetes mellitus, hypertension, left leg toe amputation. The patient is coming from home. The patient presents with complaints of shortness of breath that has been ongoing since last one week progressively worsening since last Wednesday and occurring on rest. The shortness of breath today was also suggestive of some chest tightness which was worsening with deep breath and cough.  He denies any fever or chills. He mentions he has leg swelling that has been ongoing since last one month. He also mentions about orthopnea but denies any PND. He mentions he has history of hypertension and diabetes for which he was supposed to be on medication but is not taking any medication since July 2014 and recently 2 days ago has seen her primary care physician, but has not got any medication yet. Denies any smoking denies any alcohol abuse denies any drug abuse.  Review of Systems: as mentioned in the history of present illness.  A Comprehensive review of the other systems is negative.  Past Medical History  Diagnosis Date  . Diabetes mellitus     New onset  . Hypertension    Past Surgical History  Procedure Laterality Date  . Leg surgery    . Knee surgery    . Amputation  09/06/2011    Procedure: AMPUTATION RAY;  Surgeon: Wylene Simmer, MD;  Location: [place];  Service: Orthopedics;  Laterality: Left;  Left Hallux Amputation   Social History:  reports that he has never smoked. He does not have any smokeless tobacco history on file. He reports that he does not drink alcohol or use illicit drugs. Independent for most of his  ADL.  No Known Allergies  Family History  Problem Relation Age of Onset  . Adopted: Yes    Prior to  Admission medications   Medication Sig Start Date End Date Taking? Authorizing Provider  amLODipine (NORVASC) 10 MG tablet Take 10 mg by mouth daily.    Historical Provider, MD  glipiZIDE (GLUCOTROL) 10 MG tablet Take 10 mg by mouth 2 (two) times daily before a meal.    Historical Provider, MD  hydrochlorothiazide (HYDRODIURIL) 25 MG tablet Take 1 tablet (25 mg total) by mouth daily. 07/23/12   Rosana Hoes, MD  metFORMIN (GLUCOPHAGE) 1000 MG tablet Take 1,000 mg by mouth 2 (two) times daily with a meal.    Historical Provider, MD  metoprolol (LOPRESSOR) 50 MG tablet Take 50 mg by mouth 2 (two) times daily.    Historical Provider, MD  omega-3 acid ethyl esters (LOVAZA) 1 G capsule Take 1 g by mouth 2 (two) times daily.    Historical Provider, MD    Physical Exam: Filed Vitals:   09/06/13 0515 09/06/13 0545 09/06/13 0616 09/06/13 0630  BP: 141/88 159/86  157/94  Pulse: 81 81 96 88  Temp:      TempSrc:      Resp: 21 17 17 17   SpO2: 100% 100% 98% 96%    General: Alert, Awake and Oriented to Time, Place and Person. Appear in moderate distress Eyes: PERRL ENT: Oral Mucosa clear moist. Neck: Minimal JVD Cardiovascular: S1 and S2 Present, no Murmur, Peripheral Pulses Present Respiratory: Bilateral Air entry equal and Decreased, Clear to Auscultation,  No Crackles, no wheezes  Abdomen: Bowel Sound Present, Soft and Non tender Skin: No Rash Extremities: Bilateral Pedal edema, no calf tenderness Neurologic: Grossly Unremarkable.  Labs on Admission:  CBC:  Recent Labs Lab 09/06/13 0405  WBC 12.9*  NEUTROABS 8.1*  HGB 11.4*  HCT 34.6*  MCV 93.8  PLT 493*    CMP     Component Value Date/Time   NA 140 09/06/2013 0405   K 3.7 09/06/2013 0405   CL 104 09/06/2013 0405   CO2 23 09/06/2013 0405   GLUCOSE 176* 09/06/2013 0405   BUN 26* 09/06/2013 0405   CREATININE 3.17* 09/06/2013 0405   CALCIUM 8.5 09/06/2013 0405   PROT 7.0 09/06/2013 0405   ALBUMIN 2.3* 09/06/2013 0405   AST 24 09/06/2013 0405    ALT 22 09/06/2013 0405   ALKPHOS 116 09/06/2013 0405   BILITOT 0.4 09/06/2013 0405   GFRNONAA 21* 09/06/2013 0405   GFRAA 24* 09/06/2013 0405    No results found for this basename: LIPASE, AMYLASE,  in the last 168 hours No results found for this basename: AMMONIA,  in the last 168 hours  No results found for this basename: CKTOTAL, CKMB, CKMBINDEX, TROPONINI,  in the last 168 hours BNP (last 3 results)  Recent Labs  09/06/13 0405  PROBNP 6074.0*    Radiological Exams on Admission: Dg Chest Port 1 View  09/06/2013   CLINICAL DATA:  Shortness of breath  EXAM: PORTABLE CHEST - 1 VIEW  COMPARISON:  None available  FINDINGS: The transverse heart size is at the upper limits of normal. Mediastinal silhouette within normal limits.  Lungs are hypoinflated. Chair is diffuse pulmonary vascular congestion with interstitial thickening, consistent with pulmonary edema. Small bilateral pleural effusions are present, left slightly larger than right. No definite focal infiltrates identified. No pneumothorax.  No acute osseous abnormality.  IMPRESSION: Moderate diffuse pulmonary edema with small bilateral pleural effusions.   Electronically Signed   By: Jeannine Boga M.D.   On: 09/06/2013 05:39    EKG: Independently reviewed. normal sinus rhythm, nonspecific ST and T waves changes.  Assessment/Plan Principal Problem:   Acute CHF Active Problems:   Diabetes mellitus   Pedal edema   1. Acute CHF The patient is presenting with complaints of shortness of breath which is present since last one week progressively worsening since last 3 days. He also mentions about pedal edema and leg swelling and has elevated proBNP as well as vascular congestion on chest x-ray. He has received IV Lasix and was placed on BiPAP in mentions significant improvement in his symptoms since then. At present I would continue him on IV Lasix, follow serial troponins, obtain an echocardiogram, admit the patient to telemetry. I will  check his TSH as well. He does not have any immobilization or any risk factors for PE He does not have any history of smoking or extensive wheezing to suggest any COPD or asthma. I would continue him on nebulizations as needed. He does not have any symptoms of pneumonia therefore no antibiotics needed.  2. Diabetes mellitus The patient was initially placed on glipizide and metformin but is not taking anything since last July. At present due to his kidney injury I would not put him on metformin and currently I will put him on sliding scale I will check his HbA1c.  3. Chronic kidney disease The patient mentions that in July he was initially on lisinopril for his blood pressure but they took him off since his kidneys were not functioning At present his serum  creatinine is elevated but might be a presentation of chronic disease rather than acute on chronic kidney injury I will continue to follow his BMP. Avoid nephrotoxic medications Put him on hydralazine and Imdur for his heart failure  4. Hypertension At present considering his heart failure I will put him on Toprol-XL as well as hydralazine and Imdur as well as when necessary hydralazine for accelerated hypertension  DVT Prophylaxis: subcutaneous Heparin Nutrition: Cardiac and diabetic diet  Code Status: Full  Disposition: Admitted to inpatient in telemetry unit.  Author: Berle Mull, MD [facility] Pager: [phone] 09/06/2013, 6:58 AM    If 7PM-7AM, please contact night-coverage [email or web address] Password [email or web address]

## 2013-09-06 NOTE — ED Notes (Signed)
Pt off bipap; tolerating venturi mask well.  Sating 95%.

## 2013-09-06 NOTE — Progress Notes (Addendum)
Patient ID: [name], male   DOB: [birth year], 53 y.o.   MRN: [id number]  [facility] PROGRESS NOTE  [name] QVZ:[id number] DOB: 10/17/1960 DOA: September 22, 2013 PCP: Arnoldo Morale, MD  Brief narrative: 53 y.o. male with diabetes mellitus, hypertension, left leg toe amputation, lives at home and has presented earlier today with main concern of progressively worsening shortness of breath that started one week PTA, initially present with exertion and now present at rest. This has been associated with chest tightness, difficulty taking deep breaths, non productive cough, malaise. Pt explains he has chronic LE swelling and it is not much worse than typical baseline. He denies fevers, chills, non abdominal or urinary concerns. He also explains he has diabetes and HTN but has not taken any medication sine July 2014 and has seen PCP 2 days PTA but has not started any medicines yet.   Principal Problem:   Acute CHF - pt clinically stable - 2 D ECHO pending at this time - will continue Lasix 40 mg IV QD for now, weight this AM 203 lbs and will monitor daily weights  - monitor renal function closely while pt is on Lasix and diuresing  - BMP in AM, strict I's and O's  Active Problems:   Diabetes mellitus - A1C pending - place on SSI while inpatient - may need be able to use Metformin due to renal failure   Acute on chronic renal failure - acute component imposed on chronic renal failure - pt on Lasix for now and will monitor function closely  - per record review baseline appears to be ~ 1.5     HTN - started on Hydralazine, Imdur, Metoprolol, Lasix - monitor BP control on the medications and readjust the dosing as indicated    HLD - check lipid panel     Leukocytosis - unclear etiology - will check UA and Urine culture - given somewhat low temp, will place on Rocephin for now - check procalcitonin level   Consultants:  None  Procedures/Studies:  Dg Chest Port 1 View   September 22, 2013   Moderate diffuse pulmonary edema with small bilateral pleural effusions.    Antibiotics:  Rocephin 2022/09/22 -->  Code Status: Full Family Communication: Pt at bedside Disposition Plan: Home when medically stable  HPI/Subjective: No events overnight.   Objective: Filed Vitals:   09-22-2013 0900 Sep 22, 2013 1015 09/22/2013 1029 2013/09/22 1035  BP:   142/89 162/94  Pulse:    82  Temp:      TempSrc:      Resp:      Height:      Weight:      SpO2: 100% 100%      Intake/Output Summary (Last 24 hours) at 09-22-13 1309 Last data filed at 09-22-13 1254  Gross per 24 hour  Intake      0 ml  Output   1000 ml  Net  -1000 ml    Exam:   General:  Pt is alert, follows commands appropriately, not in acute distress  Cardiovascular: Regular rate and rhythm, S1/S2, no murmurs, no rubs, no gallops  Respiratory: Clear to auscultation bilaterally, no wheezing, bibasilar crackles  Abdomen: Soft, non tender, non distended, bowel sounds present, no guarding  Extremities: LE +1 bilateral edema, pulses DP and PT palpable bilaterally  Neuro: Grossly nonfocal  Data Reviewed: Basic Metabolic Panel:  Recent Labs Lab 2013-09-22 0405  NA 140  K 3.7  CL 104  CO2 23  GLUCOSE 176*  BUN 26*  CREATININE 3.17*  CALCIUM 8.5   Liver Function Tests:  Recent Labs Lab 09/06/13 0405  AST 24  ALT 22  ALKPHOS 116  BILITOT 0.4  PROT 7.0  ALBUMIN 2.3*   CBC:  Recent Labs Lab 09/06/13 0405  WBC 12.9*  NEUTROABS 8.1*  HGB 11.4*  HCT 34.6*  MCV 93.8  PLT 493*   Cardiac Enzymes:  Recent Labs Lab 09/06/13 0754  TROPONINI <0.30   CBG:  Recent Labs Lab 09/06/13 0500 09/06/13 1122  GLUCAP 187* 156*   Scheduled Meds: . furosemide  40 mg Intravenous Daily  . heparin  5,000 Units Subcutaneous Q8H  . hydrALAZINE  25 mg Oral Q8H  . insulin aspart  0-15 Units Subcutaneous TID WC  . insulin aspart  0-5 Units Subcutaneous QHS  . ipratropium-albuterol  3 mL Nebulization Q6H  .  isosorbide dinitrate  30 mg Oral TID  . metoprolol succinate  25 mg Oral Daily   Continuous Infusions:   Faye Ramsay, MD  [facility] Pager [phone]  If 7PM-7AM, please contact night-coverage [email or web address] Password [email or web address] 09/06/2013, 1:09 PM   LOS: 0 days

## 2013-09-06 NOTE — ED Notes (Signed)
CBG-187. Notified RN

## 2013-09-06 NOTE — ED Notes (Signed)
Quick onset of sob. "breathing attack" with chest tightness. Non productive cough. No inhalers.

## 2013-09-07 DIAGNOSIS — D649 Anemia, unspecified: Secondary | ICD-10-CM

## 2013-09-07 DIAGNOSIS — I059 Rheumatic mitral valve disease, unspecified: Secondary | ICD-10-CM

## 2013-09-07 LAB — CBC
HCT: 29.2 % — ABNORMAL LOW (ref 39.0–52.0)
Hemoglobin: 9.6 g/dL — ABNORMAL LOW (ref 13.0–17.0)
MCH: 30.7 pg (ref 26.0–34.0)
MCHC: 32.9 g/dL (ref 30.0–36.0)
MCV: 93.3 fL (ref 78.0–100.0)
PLATELETS: 399 10*3/uL (ref 150–400)
RBC: 3.13 MIL/uL — ABNORMAL LOW (ref 4.22–5.81)
RDW: 13.6 % (ref 11.5–15.5)
WBC: 13.6 10*3/uL — ABNORMAL HIGH (ref 4.0–10.5)

## 2013-09-07 LAB — GLUCOSE, CAPILLARY
GLUCOSE-CAPILLARY: 116 mg/dL — AB (ref 70–99)
GLUCOSE-CAPILLARY: 102 mg/dL — AB (ref 70–99)
Glucose-Capillary: 187 mg/dL — ABNORMAL HIGH (ref 70–99)
Glucose-Capillary: 122 mg/dL — ABNORMAL HIGH (ref 70–99)

## 2013-09-07 LAB — URINE CULTURE: Colony Count: 3000

## 2013-09-07 LAB — BASIC METABOLIC PANEL
BUN: 29 mg/dL — AB (ref 6–23)
CALCIUM: 8.4 mg/dL (ref 8.4–10.5)
CO2: 27 mEq/L (ref 19–32)
CREATININE: 3.35 mg/dL — AB (ref 0.50–1.35)
Chloride: 102 mEq/L (ref 96–112)
GFR calc Af Amer: 23 mL/min — ABNORMAL LOW (ref >90)
GFR calc non Af Amer: 20 mL/min — ABNORMAL LOW (ref >90)
Glucose, Bld: 115 mg/dL — ABNORMAL HIGH (ref 70–99)
Potassium: 3.4 mEq/L — ABNORMAL LOW (ref 3.7–5.3)
Sodium: 140 mEq/L (ref 137–147)

## 2013-09-07 MED ORDER — POTASSIUM CHLORIDE CRYS ER 20 MEQ PO TBCR
40.0000 meq | EXTENDED_RELEASE_TABLET | Freq: Once | ORAL | Status: AC
  Administered 2013-09-07: 40 meq via ORAL
  Filled 2013-09-07: qty 2

## 2013-09-07 NOTE — Evaluation (Signed)
Physical Therapy Evaluation Patient Details Name: [name] MRN: [id number] DOB: [birth year] Today's Date: 09/07/2013 Time: 9518-8416 PT Time Calculation (min): 14 min  PT Assessment / Plan / Recommendation History of Present Illness  53 y.o. male with diabetes mellitus, hypertension, left leg toe amputation, lives at home who presented to [facility] ED 09/06/2013 with aggressively worsening shortness of breath started one week prior to this admission especially on exertion but also present now at rest. This was associated with chest tightness and nonproductive cough. He also has bilateral lower extremity swelling.  Patient with CHF  Clinical Impression  Patient is at supervision/independent level with all mobility and gait.  Good balance during gait.  No acute PT needs identified - PT will sign off.  Encouraged patient to continue to ambulate in hallway with nursing.    PT Assessment  Patent does not need any further PT services    Follow Up Recommendations  No PT follow up;Supervision - Intermittent    Does the patient have the potential to tolerate intense rehabilitation      Barriers to Discharge        Equipment Recommendations  None recommended by PT    Recommendations for Other Services     Frequency      Precautions / Restrictions Precautions Precautions: None Restrictions Weight Bearing Restrictions: No   Pertinent Vitals/Pain       Mobility  Bed Mobility Bed Mobility: Supine to Sit;Sitting - Scoot to Edge of Bed Supine to Sit: 7: Independent Sitting - Scoot to Edge of Bed: 7: Independent Transfers Transfers: Sit to Stand;Stand to Sit Sit to Stand: 7: Independent;From bed Stand to Sit: 7: Independent;To bed Ambulation/Gait Ambulation/Gait Assistance: 5: Supervision Ambulation Distance (Feet): 225 Feet Assistive device: None Ambulation/Gait Assistance Details: Patient with good gait pattern, balance, and speed Gait Pattern: Within Functional Limits Gait velocity:  [facility]        PT Goals(Current goals can be found in the care plan section)    Visit Information  Last PT Received On: 09/07/13 Assistance Needed: +1 History of Present Illness: 53 y.o. male with diabetes mellitus, hypertension, left leg toe amputation, lives at home who presented to [facility] ED 09/06/2013 with aggressively worsening shortness of breath started one week prior to this admission especially on exertion but also present now at rest. This was associated with chest tightness and nonproductive cough. He also has bilateral lower extremity swelling.  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 Communication Communication: No difficulties    Cognition  Cognition Arousal/Alertness: Awake/alert Behavior During Therapy: WFL for tasks assessed/performed Overall Cognitive Status: Within Functional Limits for tasks assessed    Extremity/Trunk Assessment Upper Extremity Assessment Upper Extremity Assessment: Overall WFL for tasks assessed Lower Extremity Assessment Lower Extremity Assessment: Overall WFL for tasks assessed (Lt great toe amputated) Cervical / Trunk Assessment Cervical / Trunk Assessment: Normal   Balance Balance Balance Assessed: Yes Static Standing Balance Single Leg Stance - Right Leg: 22 Single Leg Stance - Left Leg: 24 High Level Balance High Level Balance Activites: Turns;Sudden stops;Head turns High Level Balance Comments: No loss of balance with high level balance activities  End of Session PT - End of Session  Equipment Utilized During Treatment: Gait belt Activity  Tolerance: Patient tolerated treatment well Patient left: in bed;with call bell/phone within reach (sitting EOB) Nurse Communication: Mobility status  GP     Despina Pole 09/07/2013, 8:22 PM [name]. [name], [place] Pager [phone]

## 2013-09-07 NOTE — Progress Notes (Signed)
Utilization Review Completed.Oviya Ammar T1/01/2014  

## 2013-09-07 NOTE — Care Management Note (Signed)
    Page 1 of 2   09/10/2013     4:21:30 PM   CARE MANAGEMENT NOTE 05/06/7901  Patient:  [name], [name]   Account Number:  [id number]  Date Initiated:  09/07/2013  Documentation initiated by:  Tehani Mersman  Subjective/Objective Assessment:   PT ADM ON 09/06/13 WITH CHF EXACERBATION.  PTA, PT LIVES WITH SIGNIFICANT OTHER.  HE HAS BEEN WORKING AT A TEMP AGENCY. HE HAS NO INSURANCE.     Action/Plan:   PT CONCERNED ABOUT PAYING FOR DC MEDS.  HE IS ELIGIBLE FOR [place] MATCH PROGRAM FOR MED ASSISTANCE.  PT ALSO HAS NO PCP, BUT DESIRES FOLLOW UP.   Anticipated DC Date:  09/10/2013   Anticipated DC Plan:  [place]  CM consult  Medication Assistance  [facility]  [facility] [place]      Choice offered to / List presented to:             Status of service:  Completed, signed off Medicare Important Message given?   (If response is "NO", the following Medicare IM given date fields will be blank) Date Medicare IM given:   Date Additional Medicare IM given:    Discharge Disposition:    Per UR Regulation:  Reviewed for med. necessity/level of care/duration of stay  If discussed at [place] of Stay Meetings, dates discussed:    Comments:  09/10/13 Azan Maneri,RN,BSN [phone] [place].  PT APPRECIATIVE OF HELP.  09/09/13 Garett Tetzloff,RN,BSN [phone] APPT MADE AT [place] [facility] FOR PT.  APPT IS MONDAY, JAN 19 AT 10:45 AM.  INFORMATION ON AVS IN EPIC.  09/07/12 Eveleen Mcnear,RN,BSN [phone] WILL PROVIDE MATCH LETTER FOR PT UPON DC, WHICH WILL ALLOW HIM TO GET ALL DC FOR MEDS FOR $3/RX.  PT AGREEABLE TO PCP FOLLOW UP AT [place]; WILL SCHEDULE FOLLOW UP APPT PRIOR TO DC.  PT APPRECIATIVE OF HELP GIVEN.

## 2013-09-07 NOTE — Progress Notes (Addendum)
[facility] PROGRESS NOTE  [name] [id number]:[id number] DOB: [birth year] DOA: 09/06/2013 PCP: Arnoldo Morale, MD  Brief narrative: 53 y.o. male with diabetes mellitus, hypertension, left leg toe amputation, lives at home who presented to [facility] ED 09/06/2013 with aggressively worsening shortness of breath started one week prior to this admission especially on exertion but also present now at rest. This was associated with chest tightness and nonproductive cough. He also has bilateral lower extremity swelling. In ED, blood pressure was 128/76, heart rate 77 and Tmax 97.8 F and oxygen saturation 96% on room air. BNP was elevated at 6074. Patient was given Lasix IV 40 mg in emergency room. White blood cell count was elevated at 13.6, hemoglobin 9.6. Potassium was 3.4 and creatinine 3.35.  Assessment and plan:  Principal Problem:  Acute decompensated systolic and diastolic CHF - pt clinically stable  - 2 D ECHO with ejection fraction 55% on this admission. BNP elevated at 6074. Continue Lasix 40 mg IV daily (has received total of 80 mg IV lasix 09/06/2013) - Daily weight; weight on the admission 203 lbs. In past 12 hours, -3.4 L fluid balance - Replete potassium, keep above 4 and magnesium above 2  - monitor renal function closely while pt is on Lasix and diuresing  - strict I's and O's  Active Problems:  Diabetes mellitus  - A1C 7 on this admission - continue SSI - CBG's in past 24 hours: 127, 116, 187 Chronic kidney disease, stage IV - Acute component imposed on chronic renal failure  - Pt on Lasix for now and will monitor function closely  - May need a renal consult is kidney function does not improve - Creatinine on admission 3.35 and baseline in 2013 was  1.4 HTN  - started on Hydralazine, Imdur, Metoprolol, Lasix  - monitor BP control on the medications and readjust the dosing as indicated  HLD  - Triglycerides 178 and HDL 38 Leukocytosis  - unclear etiology  - will check UA and Urine  culture  - given somewhat low temp, will place on Rocephin for now  - check procalcitonin level   Code Status: Full  Family Communication: Pt at bedside  Disposition Plan: Home when medically stable  Consultants:  None Procedures/Studies:  Dg Chest Port 1 View 09/06/2013 Moderate diffuse pulmonary edema with small bilateral pleural effusions.  Antibiotics:  Rocephin 01/04 -->    Leisa Lenz, MD  [facility] Pager [phone]  If 7PM-7AM, please contact night-coverage [email or web address] Password [email or web address] 09/07/2013, 1:36 PM   LOS: 1 day    HPI/Subjective: Feels better this am.  Objective: Filed Vitals:   09/06/13 1930 09/06/13 1944 09/07/13 0516 09/07/13 0903  BP: 128/76  148/84   Pulse: 83  87   Temp: 97.8 F (36.6 C)  98.2 F (36.8 C)   TempSrc: Oral  Oral   Resp: 18  18   Height:      Weight:      SpO2: 98% 98% 99% 98%    Intake/Output Summary (Last 24 hours) at 09/07/13 1336 Last data filed at 09/07/13 1221  Gross per 24 hour  Intake   1080 ml  Output   2900 ml  Net  -1820 ml    Exam:   General:  Pt is alert, follows commands appropriately, not in acute distress  Cardiovascular: irregular rhythm, S1/S2 appreciated   Respiratory: bibasilar crackles  Abdomen: Soft, non tender, non distended, bowel sounds present, no guarding  Extremities: 2 + LE pitting edema, pulses DP and  PT palpable bilaterally  Neuro: Grossly nonfocal  Data Reviewed: Basic Metabolic Panel:  Recent Labs Lab 09/06/13 0405 09/07/13 0420  NA 140 140  K 3.7 3.4*  CL 104 102  CO2 23 27  GLUCOSE 176* 115*  BUN 26* 29*  CREATININE 3.17* 3.35*  CALCIUM 8.5 8.4   Liver Function Tests:  Recent Labs Lab 09/06/13 0405  AST 24  ALT 22  ALKPHOS 116  BILITOT 0.4  PROT 7.0  ALBUMIN 2.3*   No results found for this basename: LIPASE, AMYLASE,  in the last 168 hours No results found for this basename: AMMONIA,  in the last 168 hours CBC:  Recent Labs Lab 09/06/13 0405  09/07/13 0420  WBC 12.9* 13.6*  NEUTROABS 8.1*  --   HGB 11.4* 9.6*  HCT 34.6* 29.2*  MCV 93.8 93.3  PLT 493* 399   Cardiac Enzymes:  Recent Labs Lab 09/06/13 0754 09/06/13 1342 09/06/13 2048  TROPONINI <0.30 <0.30 <0.30   BNP: No components found with this basename: POCBNP,  CBG:  Recent Labs Lab 09/06/13 1122 09/06/13 1642 09/06/13 2115 09/07/13 0550 09/07/13 1115  GLUCAP 156* 123* 127* 116* 187*    CULTURE, BLOOD (ROUTINE X 2)     Status: None   Collection Time    09/06/13  4:15 AM      Result Value Range Status   Specimen Description BLOOD LEFT ARM   Final   Value:        BLOOD CULTURE RECEIVED NO GROWTH TO DATE      Performed at [facility]   Report Status PENDING   Incomplete  CULTURE, BLOOD (ROUTINE X 2)     Status: None   Collection Time    09/06/13  4:25 AM      Result Value Range Status   Specimen Description BLOOD LEFT HAND   Final   Value:        BLOOD CULTURE RECEIVED NO GROWTH TO DATE     Performed at [facility]   Report Status PENDING   Incomplete     Studies: Dg Chest Port 1 View 09/06/2013    IMPRESSION: Moderate diffuse pulmonary edema with small bilateral pleural effusions.     Scheduled Meds: . cefTRIAXone   1 g Intravenous Q24H  . furosemide  40 mg Intravenous Daily  . heparin  5,000 Units Subcutaneous Q8H  . hydrALAZINE  25 mg Oral Q8H  . insulin aspart  0-15 Units Subcutaneous TID WC  . insulin aspart  0-5 Units Subcutaneous QHS  . isosorbide dinitrate  30 mg Oral TID  . metoprolol succinate  25 mg Oral Daily

## 2013-09-07 NOTE — Progress Notes (Signed)
Echocardiogram 2D Echocardiogram has been performed.  [name] 09/07/2013, 10:45 AM

## 2013-09-07 NOTE — Progress Notes (Signed)
Physical Therapy Discharge Patient Details Name: [name] MRN: [id number] DOB: Sep 24, 1960 Today's Date: 09/07/2013 Time: 1290-4753 PT Time Calculation (min): 14 min  Patient discharged from PT services secondary to goals met and no further PT needs identified.  Please see latest therapy progress note for current level of functioning and progress toward goals.    Progress and discharge plan discussed with patient and/or caregiver: Patient/Caregiver agrees with plan  GP     [name] 09/07/2013, 8:24 PM

## 2013-09-08 ENCOUNTER — Inpatient Hospital Stay (HOSPITAL_COMMUNITY): Payer: No Typology Code available for payment source

## 2013-09-08 DIAGNOSIS — N184 Chronic kidney disease, stage 4 (severe): Secondary | ICD-10-CM | POA: Diagnosis present

## 2013-09-08 DIAGNOSIS — N179 Acute kidney failure, unspecified: Secondary | ICD-10-CM

## 2013-09-08 DIAGNOSIS — I5031 Acute diastolic (congestive) heart failure: Secondary | ICD-10-CM

## 2013-09-08 LAB — GLUCOSE, CAPILLARY
GLUCOSE-CAPILLARY: 111 mg/dL — AB (ref 70–99)
Glucose-Capillary: 106 mg/dL — ABNORMAL HIGH (ref 70–99)
Glucose-Capillary: 105 mg/dL — ABNORMAL HIGH (ref 70–99)
Glucose-Capillary: 127 mg/dL — ABNORMAL HIGH (ref 70–99)

## 2013-09-08 LAB — BASIC METABOLIC PANEL
BUN: 28 mg/dL — AB (ref 6–23)
CO2: 27 mEq/L (ref 19–32)
CREATININE: 3.16 mg/dL — AB (ref 0.50–1.35)
Calcium: 8.2 mg/dL — ABNORMAL LOW (ref 8.4–10.5)
Chloride: 102 mEq/L (ref 96–112)
GFR calc Af Amer: 24 mL/min — ABNORMAL LOW (ref >90)
GFR, EST NON AFRICAN AMERICAN: 21 mL/min — AB (ref >90)
Glucose, Bld: 102 mg/dL — ABNORMAL HIGH (ref 70–99)
Potassium: 4.2 mEq/L (ref 3.7–5.3)
Sodium: 140 mEq/L (ref 137–147)

## 2013-09-08 LAB — MAGNESIUM: Magnesium: 1.8 mg/dL (ref 1.5–2.5)

## 2013-09-08 LAB — PROCALCITONIN: Procalcitonin: 0.34 ng/mL

## 2013-09-08 MED ORDER — MAGNESIUM SULFATE 40 MG/ML IJ SOLN
2.0000 g | Freq: Once | INTRAMUSCULAR | Status: AC
  Administered 2013-09-08: 2 g via INTRAVENOUS
  Filled 2013-09-08: qty 50

## 2013-09-08 MED ORDER — AMLODIPINE BESYLATE 10 MG PO TABS
10.0000 mg | ORAL_TABLET | Freq: Every day | ORAL | Status: DC
  Administered 2013-09-08 – 2013-09-10 (×3): 10 mg via ORAL
  Filled 2013-09-08 (×3): qty 1

## 2013-09-08 NOTE — Progress Notes (Signed)
[facility] PROGRESS NOTE  [name] HER:[id number] DOB: [birth year] DOA: 09/06/2013 PCP: Ronald Morale, MD  Assessment/Plan: #1 acute decompensated diastolic CHF Questionable etiology. Clinical improvement. Cardiac enzymes negative x3. 2-D echo with EF of 55% with no noted wall motion abnormalities. Pro BNP was elevated at 6074. I/O = -8563 during this admission. Continue oxygen. Continue IV Lasix. Continue metoprolol. Continue Isordil. Follow.  #2 acute on chronic renal failure Patient's baseline creatinine is approximately between 1.4-1.6. Questionable etiology. Likely secondary to problem #1. Will check a urine sodium. Check a urine creatinine. Check a renal ultrasound. Continue IV Lasix. If no significant improvement her renal function will likely need a renal consultation for further evaluation and management.  #3 hypertension Blood pressure is still elevated. Continue Lasix, metoprolol, Isordil. Will discontinue hydralazine for now. Start Norvasc 10 mg daily.  #4 leukocytosis Questionable etiology. Chest x-ray was negative for acute infiltrate. Urinalysis was nitrite negative and leukocytes negative. Patient is currently afebrile. Will discontinue IV Rocephin.  #5 diabetes mellitus Hemoglobin A1c was 7. CBGs have range from 105-111. Sliding scale insulin.  #6 prophylaxis Heparin for DVT prophylaxis.  Code Status: Full Family Communication: Updated patient no family at bedside. Disposition Plan: Home in medically stable.   Consultants:  None  Procedures:  Chest x-ray 09/06/2013  2-D echo 09/07/2013  Antibiotics:  IV Rocephin 09/06/2013 >>>>09/07/13  HPI/Subjective: Patient states his breathing has improved. Patient denies any acute chest pain.  Objective: Filed Vitals:   09/08/13 1727  BP: 160/70  Pulse:   Temp:   Resp:     Intake/Output Summary (Last 24 hours) at 09/08/13 2012 Last data filed at 09/08/13 1230  Gross per 24 hour  Intake    600 ml   Output   1051 ml  Net   -451 ml   Filed Weights   09/06/13 0754 09/08/13 0421  Weight: 92.5 kg (203 lb 14.8 oz) 87.68 kg (193 lb 4.8 oz)    Exam:   General:  nad  Cardiovascular: rrr  Respiratory: ctab  Abdomen: Soft, nontender, nondistended, positive bowel sounds.  Musculoskeletal: No clubbing cyanosis. 2+ bilateral lower extremity edema.  Data Reviewed: Basic Metabolic Panel:  Recent Labs Lab 09/06/13 0405 09/07/13 0420 09/08/13 0344  NA 140 140 140  K 3.7 3.4* 4.2  CL 104 102 102  CO2 23 27 27   GLUCOSE 176* 115* 102*  BUN 26* 29* 28*  CREATININE 3.17* 3.35* 3.16*  CALCIUM 8.5 8.4 8.2*  MG  --   --  1.8   Liver Function Tests:  Recent Labs Lab 09/06/13 0405  AST 24  ALT 22  ALKPHOS 116  BILITOT 0.4  PROT 7.0  ALBUMIN 2.3*   No results found for this basename: LIPASE, AMYLASE,  in the last 168 hours No results found for this basename: AMMONIA,  in the last 168 hours CBC:  Recent Labs Lab 09/06/13 0405 09/07/13 0420  WBC 12.9* 13.6*  NEUTROABS 8.1*  --   HGB 11.4* 9.6*  HCT 34.6* 29.2*  MCV 93.8 93.3  PLT 493* 399   Cardiac Enzymes:  Recent Labs Lab 09/06/13 0754 09/06/13 1342 09/06/13 2048  TROPONINI <0.30 <0.30 <0.30   BNP (last 3 results)  Recent Labs  09/06/13 0405  PROBNP 6074.0*   CBG:  Recent Labs Lab 09/07/13 1621 09/07/13 2139 09/08/13 0631 09/08/13 1137 09/08/13 1642  GLUCAP 102* 122* 106* 111* 105*    Recent Results (from the past 240 hour(s))  CULTURE, BLOOD (ROUTINE X 2)  Status: None   Collection Time    09/06/13  4:15 AM      Result Value Range Status   Specimen Description BLOOD LEFT ARM   Final   Special Requests BOTTLES DRAWN AEROBIC AND ANAEROBIC 10CC EA   Final   Culture  Setup Time     Final   Value: 09/06/2013 13:48     Performed at [facility]   Culture     Final   Value:        BLOOD CULTURE RECEIVED NO GROWTH TO DATE CULTURE WILL BE HELD FOR 5 DAYS BEFORE ISSUING A FINAL  NEGATIVE REPORT     Performed at [facility]   Report Status PENDING   Incomplete  CULTURE, BLOOD (ROUTINE X 2)     Status: None   Collection Time    09/06/13  4:25 AM      Result Value Range Status   Specimen Description BLOOD LEFT HAND   Final   Special Requests BOTTLES DRAWN AEROBIC ONLY 5CC   Final   Culture  Setup Time     Final   Value: 09/06/2013 13:48     Performed at [facility]   Culture     Final   Value:        BLOOD CULTURE RECEIVED NO GROWTH TO DATE CULTURE WILL BE HELD FOR 5 DAYS BEFORE ISSUING A FINAL NEGATIVE REPORT     Performed at [facility]   Report Status PENDING   Incomplete  URINE CULTURE     Status: None   Collection Time    09/06/13  3:27 PM      Result Value Range Status   Specimen Description URINE, CLEAN CATCH   Final   Special Requests NONE   Final   Culture  Setup Time     Final   Value: 09/06/2013 21:21     Performed at [place]     Final   Value: 3,000 COLONIES/ML     Performed at [facility]   Culture     Final   Value: INSIGNIFICANT GROWTH     Performed at [facility]   Report Status 09/07/2013 FINAL   Final     Studies: No results found.  Scheduled Meds: . cefTRIAXone (ROCEPHIN)  IV  1 g Intravenous Q24H  . furosemide  40 mg Intravenous Daily  . heparin  5,000 Units Subcutaneous Q8H  . hydrALAZINE  25 mg Oral Q8H  . insulin aspart  0-15 Units Subcutaneous TID WC  . insulin aspart  0-5 Units Subcutaneous QHS  . isosorbide dinitrate  30 mg Oral TID  . metoprolol succinate  25 mg Oral Daily  . sodium chloride  3 mL Intravenous Q12H   Continuous Infusions:   Principal Problem:   Acute CHF Active Problems:   Hypertension   Iron deficiency anemia   Diabetes mellitus   Pedal edema   ARF (acute renal failure)    Time spent: 46 minutes    Vallory Oetken M.D. [facility] Pager [phone]. If 7PM-7AM, please contact night-coverage at [email or web address],  password [facility] 09/08/2013, 8:12 PM  LOS: 2 days

## 2013-09-08 NOTE — Progress Notes (Signed)
OT Cancellation Note  Patient Details Name: [name] MRN: [id number] DOB: [birth year]   Cancelled Treatment:    Reason Eval/Treat Not Completed: OT screened, no needs identified, will sign off  Georgean Spainhower 09/08/2013, 9:01 AM

## 2013-09-09 LAB — BASIC METABOLIC PANEL
BUN: 29 mg/dL — AB (ref 6–23)
CO2: 26 mEq/L (ref 19–32)
CREATININE: 3.26 mg/dL — AB (ref 0.50–1.35)
Calcium: 8.5 mg/dL (ref 8.4–10.5)
Chloride: 103 mEq/L (ref 96–112)
GFR, EST AFRICAN AMERICAN: 24 mL/min — AB (ref >90)
GFR, EST NON AFRICAN AMERICAN: 20 mL/min — AB (ref >90)
Glucose, Bld: 106 mg/dL — ABNORMAL HIGH (ref 70–99)
Potassium: 3.9 mEq/L (ref 3.7–5.3)
Sodium: 139 mEq/L (ref 137–147)

## 2013-09-09 LAB — CBC
HEMATOCRIT: 29.3 % — AB (ref 39.0–52.0)
Hemoglobin: 9.7 g/dL — ABNORMAL LOW (ref 13.0–17.0)
MCH: 30.9 pg (ref 26.0–34.0)
MCHC: 33.1 g/dL (ref 30.0–36.0)
MCV: 93.3 fL (ref 78.0–100.0)
Platelets: 376 10*3/uL (ref 150–400)
RBC: 3.14 MIL/uL — ABNORMAL LOW (ref 4.22–5.81)
RDW: 13.8 % (ref 11.5–15.5)
WBC: 12.2 10*3/uL — ABNORMAL HIGH (ref 4.0–10.5)

## 2013-09-09 LAB — IRON AND TIBC
Iron: 38 ug/dL — ABNORMAL LOW (ref 42–135)
Saturation Ratios: 18 % — ABNORMAL LOW (ref 20–55)
TIBC: 216 ug/dL (ref 215–435)
UIBC: 178 ug/dL (ref 125–400)

## 2013-09-09 LAB — GLUCOSE, CAPILLARY
GLUCOSE-CAPILLARY: 160 mg/dL — AB (ref 70–99)
Glucose-Capillary: 122 mg/dL — ABNORMAL HIGH (ref 70–99)
Glucose-Capillary: 139 mg/dL — ABNORMAL HIGH (ref 70–99)
Glucose-Capillary: 229 mg/dL — ABNORMAL HIGH (ref 70–99)

## 2013-09-09 LAB — PRO B NATRIURETIC PEPTIDE: PRO B NATRI PEPTIDE: 1822 pg/mL — AB (ref 0–125)

## 2013-09-09 LAB — PROTEIN / CREATININE RATIO, URINE
Creatinine, Urine: 29.84 mg/dL
Protein Creatinine Ratio: 6.27 — ABNORMAL HIGH (ref 0.00–0.15)
Total Protein, Urine: 187 mg/dL

## 2013-09-09 LAB — FERRITIN: Ferritin: 152 ng/mL (ref 22–322)

## 2013-09-09 MED ORDER — FUROSEMIDE 80 MG PO TABS
80.0000 mg | ORAL_TABLET | Freq: Two times a day (BID) | ORAL | Status: DC
  Administered 2013-09-09 – 2013-09-10 (×2): 80 mg via ORAL
  Filled 2013-09-09 (×4): qty 1

## 2013-09-09 NOTE — Consult Note (Addendum)
[name] is an 53 y.o. male referred by Dr [name]   Chief Complaint: Renal insufficiency HPI: 53 yo Hispanic male admitted 09/06/12 for SOB sec CHF. Pt had been homeless for sometime and has not been followed by any doctor.  He says he was not aware that he had any kidney problems though he was put on lisinopril last July and it was stopped because it caused renal failure.  No hx gross hematuria, obstructive sxs or use of NSAID's.  Diagnosed with DM and HTN 2 yrs ago when presented with infection of of lt great and had an amputation.  He probably had DM and HTN longer as he had never seen a Dr. Scr in 11/13 was 1.6 and on admission 3.1.  Today 3.26.  Renal US unremarkable.  Past Medical History  Diagnosis Date  . Diabetes mellitus     New onset  . Hypertension     Past Surgical History  Procedure Laterality Date  . Leg surgery    . Knee surgery    . Amputation  09/06/2011    Procedure: AMPUTATION RAY;  Surgeon: Ronald Simmer, MD;  Location: [place];  Service: Orthopedics;  Laterality: Left;  Left Hallux Amputation    Family History  Problem Relation Age of Onset  . Adopted: Yes   Social History:  reports that he has never smoked. He does not have any smokeless tobacco history on file. He reports that he does not drink alcohol or use illicit drugs. 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  He is married and has children Allergies: No Known Allergies  Medications Prior to Admission  Medication Sig Dispense Refill  . amLODipine (NORVASC) 10 MG tablet Take 10 mg by mouth daily.      Marland Kitchen glipiZIDE (GLUCOTROL) 10 MG tablet Take 10 mg by mouth 2 (two) times daily before a meal.      . hydrochlorothiazide (HYDRODIURIL) 25 MG tablet Take 1 tablet (25 mg total) by mouth daily.  30 tablet  0  . metFORMIN (GLUCOPHAGE) 1000 MG tablet Take 1,000 mg by mouth 2 (two) times daily with a meal.      . metoprolol (LOPRESSOR) 50 MG tablet Take 50 mg by mouth 2 (two) times daily.      Marland Kitchen omega-3  acid ethyl esters (LOVAZA) 1 G capsule Take 1 g by mouth 2 (two) times daily.         Lab Results: UA: 100mg  protein.  Benign microscopic   Recent Labs  09/07/13 0420 09/09/13 0325  WBC 13.6* 12.2*  HGB 9.6* 9.7*  HCT 29.2* 29.3*  PLT 399 376   BMET  Recent Labs  09/07/13 0420 09/08/13 0344 09/09/13 0325  NA 140 140 139  K 3.4* 4.2 3.9  CL 102 102 103  CO2 27 27 26   GLUCOSE 115* 102* 106*  BUN 29* 28* 29*  CREATININE 3.35* 3.16* 3.26*  CALCIUM 8.4 8.2* 8.5   LFT No results found for this basename: PROT, ALBUMIN, AST, ALT, ALKPHOS, BILITOT, BILIDIR, IBILI,  in the last 72 hours US Renal  09/08/2013   CLINICAL DATA:  Acute renal failure.  EXAM: RENAL/URINARY TRACT ULTRASOUND COMPLETE  COMPARISON:  None.  FINDINGS: Right Kidney:  Length: 11 cm. Echogenicity within normal limits. No mass or hydronephrosis visualized.  Left Kidney:  Length: 11 cm. Echogenicity within normal limits. No mass or hydronephrosis visualized.  Bladder:  Appears normal for degree of bladder distention. Bilateral ureteral jets present.  IMPRESSION:  Negative renal ultrasound.  No hydronephrosis.   Electronically Signed   By: [name] M.D.   On: 09/08/2013 23:46    ROS: No change in vision Breathing better No CP No abd pain No n/v + edema for weeks Mild numbness hands and feet No arthritic CO  PHYSICAL EXAM: Blood pressure 138/80, pulse 80, temperature 97.9 F (36.6 C), temperature source Oral, resp. rate 20, height 5\' 11"  (1.803 m), weight 86.773 kg (191 lb 4.8 oz), SpO2 96.00%. HEENT: PERRLA EOMI NECK:no JVD No bruits LUNGS:Basilar crackles CARDIAC:RRR wo MRG ABD:+BS NTND no HSM EXT:1+ edema  Amp lt gr toe NEURO:CNI MI  decrease sensation in feet  No asterixis  Assessment: 1. Probable CKD4, Suspect his Scr of 3's represents progression of CKD from DM/HTN 2. Anemia 3. Volume overload 4. HTN 5. DM PLAN: 1. Check pr/cr, pth, iron studies, spep, PO4 2. PO lasix 80mg   bid 3. Educate on renal diet 4. No ace/arb for now until he shows he can FU 5. Renal profile in AM   [name] 09/09/2013, 1:24 PM

## 2013-09-09 NOTE — Progress Notes (Addendum)
[facility] PROGRESS NOTE  [name] [id number] DOB: 08-Apr-1961 DOA: 09/06/2013 PCP: Arnoldo Morale, MD  Assessment/Plan: #1 acute decompensated diastolic CHF Questionable etiology. Clinical improvement. Cardiac enzymes negative x3. 2-D echo with EF of 55% with no noted wall motion abnormalities has diastolic dysfunction. Pro BNP was elevated at 6074. I/O = -[id number] during this admission.  Continue metoprolol. Continue Isordil.  Now off of O2 with clear lungs on exam - appears euvolemic  Cont Lasix per renal team  #2 acute on chronic renal failure Patient's baseline creatinine is approximately between 1.4-1.6.  Renal ultrasound not consistent with medical renal disease or any other pathology. Requested nephro consult.   #3 hypertension BP better controlled at this time.   #4 leukocytosis Questionable etiology. Chest x-ray was negative for acute infiltrate. Urinalysis was nitrite negative and leukocytes negative. Patient is currently afebrile. IV Rocephin was discontinued on 1/6.   #5 diabetes mellitus Hemoglobin A1c was 7. CBGs have range from 105-111. Sliding scale insulin.  #6 prophylaxis Heparin for DVT prophylaxis.  Code Status: Full Family Communication: Updated patient no family at bedside. Disposition Plan: Home when medically stable.   Consultants:  None  Procedures:  Chest x-ray 09/06/2013  2-D echo 09/07/2013  Antibiotics:  IV Rocephin 09/06/2013 >>>>09/07/13  HPI/Subjective: Patient feels like his breathing has normalized back to baseline. Have discussed his renal function in detail.   Objective: Filed Vitals:   09/09/13 1330  BP: 135/77  Pulse: 92  Temp: 98.1 F (36.7 C)  Resp: 18    Intake/Output Summary (Last 24 hours) at 09/09/13 1626 Last data filed at 09/09/13 1251  Gross per 24 hour  Intake   1440 ml  Output   1950 ml  Net   -510 ml   Filed Weights   09/06/13 0754 09/08/13 0421 09/09/13 0556  Weight: 92.5 kg (203 lb 14.8 oz)  87.68 kg (193 lb 4.8 oz) 86.773 kg (191 lb 4.8 oz)    Exam:   General:  nad  Cardiovascular: rrr no murmurs  Respiratory: ctab on room air with pulse ox of 96%  Abdomen: Soft, nontender, nondistended, positive bowel sounds.  Musculoskeletal: No clubbing cyanosis. No pedal edema  Data Reviewed: Basic Metabolic Panel:  Recent Labs Lab 09/06/13 0405 09/07/13 0420 09/08/13 0344 09/09/13 0325  NA 140 140 140 139  K 3.7 3.4* 4.2 3.9  CL 104 102 102 103  CO2 23 27 27 26   GLUCOSE 176* 115* 102* 106*  BUN 26* 29* 28* 29*  CREATININE 3.17* 3.35* 3.16* 3.26*  CALCIUM 8.5 8.4 8.2* 8.5  MG  --   --  1.8  --    Liver Function Tests:  Recent Labs Lab 09/06/13 0405  AST 24  ALT 22  ALKPHOS 116  BILITOT 0.4  PROT 7.0  ALBUMIN 2.3*   No results found for this basename: LIPASE, AMYLASE,  in the last 168 hours No results found for this basename: AMMONIA,  in the last 168 hours CBC:  Recent Labs Lab 09/06/13 0405 09/07/13 0420 09/09/13 0325  WBC 12.9* 13.6* 12.2*  NEUTROABS 8.1*  --   --   HGB 11.4* 9.6* 9.7*  HCT 34.6* 29.2* 29.3*  MCV 93.8 93.3 93.3  PLT 493* 399 376   Cardiac Enzymes:  Recent Labs Lab 09/06/13 0754 09/06/13 1342 09/06/13 2048  TROPONINI <0.30 <0.30 <0.30   BNP (last 3 results)  Recent Labs  09/06/13 0405 09/09/13 0325  PROBNP 6074.0* 1822.0*   CBG:  Recent Labs Lab 09/08/13 1137  09/08/13 1642 09/08/13 2154 09/09/13 0636 09/09/13 1132  GLUCAP 111* 105* 127* 122* 160*    Recent Results (from the past 240 hour(s))  CULTURE, BLOOD (ROUTINE X 2)     Status: None   Collection Time    09/06/13  4:15 AM      Result Value Range Status   Specimen Description BLOOD LEFT ARM   Final   Special Requests BOTTLES DRAWN AEROBIC AND ANAEROBIC 10CC EA   Final   Culture  Setup Time     Final   Value: 09/06/2013 13:48     Performed at [facility]   Culture     Final   Value:        BLOOD CULTURE RECEIVED NO GROWTH TO DATE  CULTURE WILL BE HELD FOR 5 DAYS BEFORE ISSUING A FINAL NEGATIVE REPORT     Performed at [facility]   Report Status PENDING   Incomplete  CULTURE, BLOOD (ROUTINE X 2)     Status: None   Collection Time    09/06/13  4:25 AM      Result Value Range Status   Specimen Description BLOOD LEFT HAND   Final   Special Requests BOTTLES DRAWN AEROBIC ONLY 5CC   Final   Culture  Setup Time     Final   Value: 09/06/2013 13:48     Performed at [facility]   Culture     Final   Value:        BLOOD CULTURE RECEIVED NO GROWTH TO DATE CULTURE WILL BE HELD FOR 5 DAYS BEFORE ISSUING A FINAL NEGATIVE REPORT     Performed at [facility]   Report Status PENDING   Incomplete  URINE CULTURE     Status: None   Collection Time    09/06/13  3:27 PM      Result Value Range Status   Specimen Description URINE, CLEAN CATCH   Final   Special Requests NONE   Final   Culture  Setup Time     Final   Value: 09/06/2013 21:21     Performed at [place]     Final   Value: 3,000 COLONIES/ML     Performed at [facility]   Culture     Final   Value: INSIGNIFICANT GROWTH     Performed at [facility]   Report Status 09/07/2013 FINAL   Final     Studies: US Renal  09/08/2013   CLINICAL DATA:  Acute renal failure.  EXAM: RENAL/URINARY TRACT ULTRASOUND COMPLETE  COMPARISON:  None.  FINDINGS: Right Kidney:  Length: 11 cm. Echogenicity within normal limits. No mass or hydronephrosis visualized.  Left Kidney:  Length: 11 cm. Echogenicity within normal limits. No mass or hydronephrosis visualized.  Bladder:  Appears normal for degree of bladder distention. Bilateral ureteral jets present.  IMPRESSION: Negative renal ultrasound.  No hydronephrosis.   Electronically Signed   By: Jorje Guild M.D.   On: 09/08/2013 23:46    Scheduled Meds: . amLODipine  10 mg Oral Daily  . furosemide  80 mg Oral BID  . heparin  5,000 Units Subcutaneous Q8H  . insulin  aspart  0-15 Units Subcutaneous TID WC  . insulin aspart  0-5 Units Subcutaneous QHS  . isosorbide dinitrate  30 mg Oral TID  . metoprolol succinate  25 mg Oral Daily  . sodium chloride  3 mL Intravenous Q12H   Continuous Infusions:  Principal Problem:   Acute CHF Active Problems:   Hypertension   Iron deficiency anemia   Diabetes mellitus   Pedal edema   ARF (acute renal failure)    Time spent: 24 minutes    [facility] M.D. [facility] Pager [phone]. If 7PM-7AM, please contact night-coverage at [email or web address], password [facility] 09/09/2013, 4:26 PM  LOS: 3 days

## 2013-09-10 LAB — RENAL FUNCTION PANEL
ALBUMIN: 2.2 g/dL — AB (ref 3.5–5.2)
BUN: 29 mg/dL — ABNORMAL HIGH (ref 6–23)
CO2: 26 mEq/L (ref 19–32)
Calcium: 8.5 mg/dL (ref 8.4–10.5)
Chloride: 101 mEq/L (ref 96–112)
Creatinine, Ser: 3 mg/dL — ABNORMAL HIGH (ref 0.50–1.35)
GFR calc non Af Amer: 22 mL/min — ABNORMAL LOW (ref >90)
GFR, EST AFRICAN AMERICAN: 26 mL/min — AB (ref >90)
Glucose, Bld: 92 mg/dL (ref 70–99)
PHOSPHORUS: 4.2 mg/dL (ref 2.3–4.6)
POTASSIUM: 3.7 meq/L (ref 3.7–5.3)
SODIUM: 141 meq/L (ref 137–147)

## 2013-09-10 LAB — GLUCOSE, CAPILLARY
Glucose-Capillary: 97 mg/dL (ref 70–99)
Glucose-Capillary: 111 mg/dL — ABNORMAL HIGH (ref 70–99)

## 2013-09-10 LAB — PARATHYROID HORMONE, INTACT (NO CA): PTH: 128 pg/mL — AB (ref 14.0–72.0)

## 2013-09-10 MED ORDER — FUROSEMIDE 80 MG PO TABS: 40.0000 mg | ORAL_TABLET | Freq: Two times a day (BID) | ORAL | Status: DC

## 2013-09-10 MED ORDER — METOPROLOL SUCCINATE ER 25 MG PO TB24: 25.0000 mg | ORAL_TABLET | Freq: Every day | ORAL | Status: DC

## 2013-09-10 MED ORDER — AMLODIPINE BESYLATE 10 MG PO TABS: 10.0000 mg | ORAL_TABLET | Freq: Every day | ORAL | Status: DC

## 2013-09-10 MED ORDER — GLIPIZIDE 10 MG PO TABS: 10.0000 mg | ORAL_TABLET | Freq: Two times a day (BID) | ORAL | Status: DC

## 2013-09-10 MED ORDER — HYDRALAZINE HCL 10 MG PO TABS: 10.0000 mg | ORAL_TABLET | Freq: Three times a day (TID) | ORAL | Status: DC

## 2013-09-10 MED ORDER — ISOSORBIDE DINITRATE 30 MG PO TABS: 30.0000 mg | ORAL_TABLET | Freq: Three times a day (TID) | ORAL | Status: DC

## 2013-09-10 NOTE — Discharge Instructions (Signed)
Heart Failure °Heart failure is a condition in which the heart has trouble pumping blood. This means your heart does not pump blood efficiently for your body to work well. In some cases of heart failure, fluid may back up into your lungs or you may have swelling (edema) in your lower legs. Heart failure is usually a long-term (chronic) condition. It is important for you to take good care of yourself and follow your caregiver's treatment plan. °CAUSES  °Some health conditions can cause heart failure. Those health conditions include: °· High blood pressure (hypertension) causes the heart muscle to work harder than normal. When pressure in the blood vessels is high, the heart needs to pump (contract) with more force in order to circulate blood throughout the body. High blood pressure eventually causes the heart to become stiff and weak. °· Coronary artery disease (CAD) is the buildup of cholesterol and fat (plaque) in the arteries of the heart. The blockage in the arteries deprives the heart muscle of oxygen and blood. This can cause chest pain and may lead to a heart attack. High blood pressure can also contribute to CAD. °· Heart attack (myocardial infarction) occurs when 1 or more arteries in the heart become blocked. The loss of oxygen damages the muscle tissue of the heart. When this happens, part of the heart muscle dies. The injured tissue does not contract as well and weakens the heart's ability to pump blood. °· Abnormal heart valves can cause heart failure when the heart valves do not open and close properly. This makes the heart muscle pump harder to keep the blood flowing. °· Heart muscle disease (cardiomyopathy or myocarditis) is damage to the heart muscle from a variety of causes. These can include drug or alcohol abuse, infections, or unknown reasons. These can increase the risk of heart failure. °· Lung disease makes the heart work harder because the lungs do not work properly. This can cause a strain  on the heart, leading it to fail. °· Diabetes increases the risk of heart failure. High blood sugar contributes to high fat (lipid) levels in the blood. Diabetes can also cause slow damage to tiny blood vessels that carry important nutrients to the heart muscle. When the heart does not get enough oxygen and food, it can cause the heart to become weak and stiff. This leads to a heart that does not contract efficiently. °· Other conditions can contribute to heart failure. These include abnormal heart rhythms, thyroid problems, and low blood counts (anemia). °Certain unhealthy behaviors can increase the risk of heart failure. Those unhealthy behaviors include: °· Being overweight. °· Smoking or chewing tobacco. °· Eating foods high in fat and cholesterol. °· Abusing illicit drugs or alcohol. °· Lacking physical activity. °SYMPTOMS  °Heart failure symptoms may vary and can be hard to detect. Symptoms may include: °· Shortness of breath with activity, such as climbing stairs. °· Persistent cough. °· Swelling of the feet, ankles, legs, or abdomen. °· Unexplained weight gain. °· Difficulty breathing when lying flat (orthopnea). °· Waking from sleep because of the need to sit up and get more air. °· Rapid heartbeat. °· Fatigue and loss of energy. °· Feeling lightheaded, dizzy, or close to fainting. °· Loss of appetite. °· Nausea. °· Increased urination during the night (nocturia). °DIAGNOSIS  °A diagnosis of heart failure is based on your history, symptoms, physical examination, and diagnostic tests. °Diagnostic tests for heart failure may include: °· Echocardiography. °· Electrocardiography. °· Chest X-ray. °· Blood tests. °· Exercise   stress test. °· Cardiac angiography. °· Radionuclide scans. °TREATMENT  °Treatment is aimed at managing the symptoms of heart failure. Medicines, behavioral changes, or surgical intervention may be necessary to treat heart failure. °· Medicines to help treat heart failure may  include: °· Angiotensin-converting enzyme (ACE) inhibitors. This type of medicine blocks the effects of a blood protein called angiotensin-converting enzyme. ACE inhibitors relax (dilate) the blood vessels and help lower blood pressure. °· Angiotensin receptor blockers. This type of medicine blocks the actions of a blood protein called angiotensin. Angiotensin receptor blockers dilate the blood vessels and help lower blood pressure. °· Water pills (diuretics). Diuretics cause the kidneys to remove salt and water from the blood. The extra fluid is removed through urination. This loss of extra fluid lowers the volume of blood the heart pumps. °· Beta blockers. These prevent the heart from beating too fast and improve heart muscle strength. °· Digitalis. This increases the force of the heartbeat. °· Healthy behavior changes include: °· Obtaining and maintaining a healthy weight. °· Stopping smoking or chewing tobacco. °· Eating heart healthy foods. °· Limiting or avoiding alcohol. °· Stopping illicit drug use. °· Physical activity as directed by your caregiver. °· Surgical treatment for heart failure may include: °· A procedure to open blocked arteries, repair damaged heart valves, or remove damaged heart muscle tissue. °· A pacemaker to improve heart muscle function and control certain abnormal heart rhythms. °· An internal cardioverter defibrillator to treat certain serious abnormal heart rhythms. °· A left ventricular assist device to assist the pumping ability of the heart. °HOME CARE INSTRUCTIONS  °· Take your medicine as directed by your caregiver. Medicines are important in reducing the workload of your heart, slowing the progression of heart failure, and improving your symptoms. °· Do not stop taking your medicine unless directed by your caregiver. °· Do not skip any dose of medicine. °· Refill your prescriptions before you run out of medicine. Your medicines are needed every day. °· Take over-the-counter  medicine only as directed by your caregiver or pharmacist. °· Engage in moderate physical activity if directed by your caregiver. Moderate physical activity can benefit some people. The elderly and people with severe heart failure should consult with a caregiver for physical activity recommendations. °· Eat heart healthy foods. Food choices should be free of trans fat and low in saturated fat, cholesterol, and salt (sodium). Healthy choices include fresh or frozen fruits and vegetables, fish, lean meats, legumes, fat-free or low-fat dairy products, and whole grain or high fiber foods. Talk to a dietitian to learn more about heart healthy foods. °· Limit sodium if directed by your caregiver. Sodium restriction may reduce symptoms of heart failure in some people. Talk to a dietitian to learn more about heart healthy seasonings. °· Use healthy cooking methods. Healthy cooking methods include roasting, grilling, broiling, baking, poaching, steaming, or stir-frying. Talk to a dietitian to learn more about healthy cooking methods. °· Limit fluids if directed by your caregiver. Fluid restriction may reduce symptoms of heart failure in some people. °· Weigh yourself every day. Daily weights are important in the early recognition of excess fluid. You should weigh yourself every morning after you urinate and before you eat breakfast. Wear the same amount of clothing each time you weigh yourself. Record your daily weight. Provide your caregiver with your weight record. °· Monitor and record your blood pressure if directed by your caregiver. °· Check your pulse if directed by your caregiver. °· Lose weight if directed   by your caregiver. Weight loss may reduce symptoms of heart failure in some people.  Stop smoking or chewing tobacco. Nicotine makes your heart work harder by causing your blood vessels to constrict. Do not use nicotine gum or patches before talking to your caregiver.  Schedule and attend follow-up visits as  directed by your caregiver. It is important to keep all your appointments.  Limit alcohol intake to no more than 1 drink per day for nonpregnant women and 2 drinks per day for men. Drinking more than that is harmful to your heart. Tell your caregiver if you drink alcohol several times a week. Talk with your caregiver about whether alcohol is safe for you. If your heart has already been damaged by alcohol or you have severe heart failure, drinking alcohol should be stopped completely.  Stop illicit drug use.  Stay up-to-date with immunizations. It is especially important to prevent respiratory infections through current pneumococcal and influenza immunizations.  Manage other health conditions such as hypertension, diabetes, thyroid disease, or abnormal heart rhythms as directed by your caregiver.  Learn to manage stress.  Plan rest periods when fatigued.  Learn strategies to manage high temperatures. If the weather is extremely hot:  Avoid vigorous physical activity.  Use air conditioning or fans or seek a cooler location.  Avoid caffeine and alcohol.  Wear loose-fitting, lightweight, and light-colored clothing.  Learn strategies to manage cold temperatures. If the weather is extremely cold:  Avoid vigorous physical activity.  Layer clothes.  Wear mittens or gloves, a hat, and a scarf when going outside.  Avoid alcohol.  Obtain ongoing education and support as needed.  Participate or seek rehabilitation as needed to maintain or improve independence and quality of life. SEEK MEDICAL CARE IF:   Your weight increases by 03 lb/1.4 kg in 1 day or 05 lb/2.3 kg in a week.  You have increasing shortness of breath that is unusual for you.  You are unable to participate in your usual physical activities.  You tire easily.  You cough more than normal, especially with physical activity.  You have any or more swelling in areas such as your hands, feet, ankles, or abdomen.  You  are unable to sleep because it is hard to breathe.  You feel like your heart is beating fast (palpitations).  You become dizzy or lightheaded upon standing up. SEEK IMMEDIATE MEDICAL CARE IF:   You have difficulty breathing.  There is a change in mental status such as decreased alertness or difficulty with concentration.  You have a pain or discomfort in your chest.  You have an episode of fainting (syncope). MAKE SURE YOU:   Understand these instructions.  Will watch your condition.  Will get help right away if you are not doing well or get worse. Document Released: 08/20/2005 Document Revised: 12/15/2012 Document Reviewed: 09/11/2012 [facility] Patient Information 2014 [place], [place].  Diabetes and Exercise Exercising regularly is important. It is not just about losing weight. It has many health benefits, such as:  Improving your overall fitness, flexibility, and endurance.  Increasing your bone density.  Helping with weight control.  Decreasing your body fat.  Increasing your muscle strength.  Reducing stress and tension.  Improving your overall health. People with diabetes who exercise gain additional benefits because exercise:  Reduces appetite.  Improves the body's use of blood sugar (glucose).  Helps lower or control blood glucose.  Decreases blood pressure.  Helps control blood lipids (such as cholesterol and triglycerides).  Improves the body's  use of the hormone insulin by:  Increasing the body's insulin sensitivity.  Reducing the body's insulin needs.  Decreases the risk for heart disease because exercising:  Lowers cholesterol and triglycerides levels.  Increases the levels of good cholesterol (such as high-density lipoproteins [HDL]) in the body.  Lowers blood glucose levels. YOUR ACTIVITY PLAN  Choose an activity that you enjoy and set realistic goals. Your health care provider or diabetes educator can help you make an activity plan that  works for you. You can break activities into 2 or 3 sessions throughout the day. Doing so is as good as one long session. Exercise ideas include:  Taking the dog for a walk.  Taking the stairs instead of the elevator.  Dancing to your favorite song.  Doing your favorite exercise with a friend. RECOMMENDATIONS FOR EXERCISING WITH TYPE 1 OR TYPE 2 DIABETES   Check your blood glucose before exercising. If blood glucose levels are greater than 240 mg/dL, check for urine ketones. Do not exercise if ketones are present.  Avoid injecting insulin into areas of the body that are going to be exercised. For example, avoid injecting insulin into:  The arms when playing tennis.  The legs when jogging.  Keep a record of:  Food intake before and after you exercise.  Expected peak times of insulin action.  Blood glucose levels before and after you exercise.  The type and amount of exercise you have done.  Review your records with your health care provider. Your health care provider will help you to develop guidelines for adjusting food intake and insulin amounts before and after exercising.  If you take insulin or oral hypoglycemic agents, watch for signs and symptoms of hypoglycemia. They include:  Dizziness.  Shaking.  Sweating.  Chills.  Confusion.  Drink plenty of water while you exercise to prevent dehydration or heat stroke. Body water is lost during exercise and must be replaced.  Talk to your health care provider before starting an exercise program to make sure it is safe for you. Remember, almost any type of activity is better than none. Document Released: 11/10/2003 Document Revised: 04/22/2013 Document Reviewed: 01/27/2013 [facility] Patient Information 2014 [place].

## 2013-09-10 NOTE — Plan of Care (Signed)
Problem: Food- and Nutrition-Related Knowledge Deficit (NB-1.1) Goal: Nutrition education Formal process to instruct or train a patient/client in a skill or to impart knowledge to help patients/clients voluntarily manage or modify food choices and eating behavior to maintain or improve health. Outcome: Completed/Met Date Met:  09/10/13  RD consulted for renal diet education.   Provided Choose-A-Meal Booklet to patient. Reviewed food groups and provided written recommended serving sizes specifically determined for patient's current nutritional status.   Explained why diet restrictions are needed and provided lists of foods to limit/avoid that are high potassium, sodium, and phosphorus. Provided recommendations on safer alternatives of these foods as well as renal-friendly beverage options. Teach back method used.  Expect good compliance.  Body mass index is 26.42 kg/(m^2). Pt meets criteria for Overweight based on current BMI.  Current diet order is Renal 60/70-10-05-1198 ml, patient is consuming approximately 100% of meals at this time. Labs and medications reviewed. No further nutrition interventions warranted at this time. If additional nutrition issues arise, please re-consult RD.  Recommend Renal 80/90-10-05-1198 ml diet to better meet estimated nutrition needs.  Thanks.  Arthur Holms, RD, LDN Pager #: [phone] After-Hours Pager #: [phone]

## 2013-09-10 NOTE — Progress Notes (Signed)
S: no new co O:BP 143/77  Pulse 90  Temp(Src) 98.7 F (37.1 C) (Oral)  Resp 18  Ht 5\' 11"  (1.803 m)  Wt 85.9 kg (189 lb 6 oz)  BMI 26.42 kg/m2  SpO2 92%  Intake/Output Summary (Last 24 hours) at 09/10/13 1056 Last data filed at 09/10/13 0254  Gross per 24 hour  Intake    840 ml  Output   1250 ml  Net   -410 ml   Weight change: -0.873 kg (-1 lb 14.8 oz) KGM:WNUUV and alert CVS:RRR Resp:clear Abd:+ BS NTND Ext:tr edema NEURO:CNI Ox3 no asterixis   . amLODipine  10 mg Oral Daily  . furosemide  80 mg Oral BID  . heparin  5,000 Units Subcutaneous Q8H  . insulin aspart  0-15 Units Subcutaneous TID WC  . insulin aspart  0-5 Units Subcutaneous QHS  . isosorbide dinitrate  30 mg Oral TID  . metoprolol succinate  25 mg Oral Daily  . sodium chloride  3 mL Intravenous Q12H   US Renal  09/08/2013   CLINICAL DATA:  Acute renal failure.  EXAM: RENAL/URINARY TRACT ULTRASOUND COMPLETE  COMPARISON:  None.  FINDINGS: Right Kidney:  Length: 11 cm. Echogenicity within normal limits. No mass or hydronephrosis visualized.  Left Kidney:  Length: 11 cm. Echogenicity within normal limits. No mass or hydronephrosis visualized.  Bladder:  Appears normal for degree of bladder distention. Bilateral ureteral jets present.  IMPRESSION: Negative renal ultrasound.  No hydronephrosis.   Electronically Signed   By: Jorje Guild M.D.   On: 09/08/2013 23:46   BMET    Component Value Date/Time   NA 141 09/10/2013 0350   K 3.7 09/10/2013 0350   CL 101 09/10/2013 0350   CO2 26 09/10/2013 0350   GLUCOSE 92 09/10/2013 0350   BUN 29* 09/10/2013 0350   CREATININE 3.00* 09/10/2013 0350   CALCIUM 8.5 09/10/2013 0350   GFRNONAA 22* 09/10/2013 0350   GFRAA 26* 09/10/2013 0350   CBC    Component Value Date/Time   WBC 12.2* 09/09/2013 0325   RBC 3.14* 09/09/2013 0325   RBC 3.75* 08/24/2011 0535   HGB 9.7* 09/09/2013 0325   HCT 29.3* 09/09/2013 0325   PLT 376 09/09/2013 0325   MCV 93.3 09/09/2013 0325   MCH 30.9 09/09/2013 0325   MCHC  33.1 09/09/2013 0325   RDW 13.8 09/09/2013 0325   LYMPHSABS 3.3 09/06/2013 0405   MONOABS 1.0 09/06/2013 0405   EOSABS 0.5 09/06/2013 0405   BASOSABS 0.1 09/06/2013 0405     Assessment: 1. CKD 4 sec DM/HTN 2. HTN 3. DM  Plan: 1. Note plan for DC today 2.  Will have my office call him for FU visit in 1 month 3. Cont diuretics      [name]

## 2013-09-10 NOTE — Progress Notes (Signed)
Patient for discharge home today, reviewed discharge instructions including following up and heart failure care, bus pass provided by Education officer, museum,. Plan discharge home today [name]

## 2013-09-10 NOTE — Discharge Summary (Signed)
DISCHARGE SUMMARY  [name]  MR#: [id number]  DOB:06/02/1961  Date of Admission: 09/06/2013 Date of Discharge: 09/10/2013  Attending Physician:Carzell Saldivar T  Patient's BWL:SLHT, Odette Horns, MD  Consults: Nephrology   Disposition: D/C home   Follow-up Appts:     Follow-up Information   Follow up with [facility]     On 09/21/2013. (10:45; Please bring photo ID and all medications you are currently taking.  )    Contact information:   [address] [place] [address] [phone]     Tests Needing Follow-up: - BMET is suggested to assess renal function - F/U of CBG control and BP control is indicated    Discharge Diagnoses: acute decompensated diastolic CHF  acute on chronic kidney disease - stage IV  hypertension  diabetes mellitus  normocytic anemia   Initial presentation: 53 y.o. male with history of diabetes mellitus, hypertension, left great toe amputation who presented with complaints of shortness of breath that had been ongoing for one week. The shortness of breath was accompanied by some chest tightness which was worsening with deep breath and cough.  He denied fever or chills. He mentioned he had noted leg swelling for one month.  He has a history of hypertension and diabetes for which he is supposed to be on medication but he has not taken any medication since July 2014.  Hospital Course:  acute decompensated diastolic CHF  cardiac enzymes negative x3 - TTE with EF of 55% with no noted wall motion abnormalities but confirmed diastolic dysfunction - I/O = -7416 this admit - continue metoprolol, Isordil - now off of O2 with clear lungs on exam - appears euvolemic - cont Lasix - strict BP control   acute on chronic kidney disease - stage IV  baseline creatinine was previously approximately 1.4-1.6 - renal ultrasound not consistent with medical renal disease or any acute pathology - Nephrology evaluated - crt peaked at 3.35  - crt 3.0 at time of d/c   hypertension  BP better controlled at this time, but will room for improvement still - meds adjusted - no ace/arb for now until he shows he can FU - CSW has arranged for assistance w/ obtaining meds at d/c - pt scheduled for f/[facility] in [facility] - extreme importance of strict control and f/u explained to pt before d/c  leukocytosis  Unclear etiology - Chest x-ray was negative for acute infiltrate - Urinalysis was nitrite negative and leukocytes negative - Patient was afebrile   diabetes mellitus  Hemoglobin A1c was 7- CBGs reasonably controlled during hospital stay - LDL is 53   Normocytic anemia  Likely due to renal failure - long term outpt f/u indicated     Medication List    STOP taking these medications       hydrochlorothiazide 25 MG tablet  Commonly known as:  HYDRODIURIL     metFORMIN 1000 MG tablet  Commonly known as:  GLUCOPHAGE     metoprolol 50 MG tablet  Commonly known as:  LOPRESSOR      TAKE these medications       amLODipine 10 MG tablet  Commonly known as:  NORVASC  Take 1 tablet (10 mg total) by mouth daily.     furosemide 80 MG tablet  Commonly known as:  LASIX  Take 0.5 tablets (40 mg total) by mouth 2 (two) times daily.     glipiZIDE 10 MG tablet  Commonly known as:  GLUCOTROL  Take 1  tablet (10 mg total) by mouth 2 (two) times daily before a meal.     hydrALAZINE 10 MG tablet  Commonly known as:  APRESOLINE  Take 1 tablet (10 mg total) by mouth 3 (three) times daily.     isosorbide dinitrate 30 MG tablet  Commonly known as:  ISORDIL  Take 1 tablet (30 mg total) by mouth 3 (three) times daily.     metoprolol succinate 25 MG 24 hr tablet  Commonly known as:  TOPROL-XL  Take 1 tablet (25 mg total) by mouth daily.     omega-3 acid ethyl esters 1 G capsule  Commonly known as:  LOVAZA  Take 1 g by mouth 2 (two) times daily.       Day of Discharge BP 143/77  Pulse 90  Temp(Src) 98.7 F (37.1 C) (Oral)   Resp 18  Ht 5\' 11"  (1.803 m)  Wt 85.9 kg (189 lb 6 oz)  BMI 26.42 kg/m2  SpO2 92%  Physical Exam: General: No acute respiratory distress Lungs: Clear to auscultation bilaterally without wheezes or crackles Cardiovascular: Regular rate and rhythm without murmur gallop or rub normal S1 and S2 Abdomen: Nontender, nondistended, soft, bowel sounds positive, no rebound, no ascites, no appreciable mass Extremities: No significant cyanosis, clubbing, or edema bilateral lower extremities  Results for orders placed during the hospital encounter of 09/06/13 (from the past 24 hour(s))  GLUCOSE, CAPILLARY     Status: Abnormal   Collection Time    09/09/13 11:32 AM      Result Value Range   Glucose-Capillary 160 (*) 70 - 99 mg/dL   Comment 1 Documented in Chart     Comment 2 Notify RN    IRON AND TIBC     Status: Abnormal   Collection Time    09/09/13  2:40 PM      Result Value Range   Iron 38 (*) 42 - 135 ug/dL   TIBC 216  215 - 435 ug/dL   Saturation Ratios 18 (*) 20 - 55 %   UIBC 178  125 - 400 ug/dL  FERRITIN     Status: None   Collection Time    09/09/13  2:40 PM      Result Value Range   Ferritin 152  22 - 322 ng/mL  PROTEIN / CREATININE RATIO, URINE     Status: Abnormal   Collection Time    09/09/13  4:43 PM      Result Value Range   Creatinine, Urine 29.84     Total Protein, Urine 187     PROTEIN CREATININE RATIO 6.27 (*) 0.00 - 0.15  GLUCOSE, CAPILLARY     Status: Abnormal   Collection Time    09/09/13  5:12 PM      Result Value Range   Glucose-Capillary 139 (*) 70 - 99 mg/dL  GLUCOSE, CAPILLARY     Status: Abnormal   Collection Time    09/09/13  9:46 PM      Result Value Range   Glucose-Capillary 229 (*) 70 - 99 mg/dL  RENAL FUNCTION PANEL     Status: Abnormal   Collection Time    09/10/13  3:50 AM      Result Value Range   Sodium 141  137 - 147 mEq/L   Potassium 3.7  3.7 - 5.3 mEq/L   Chloride 101  96 - 112 mEq/L   CO2 26  19 - 32 mEq/L   Glucose, Bld 92  70 -  99 mg/dL  BUN 29 (*) 6 - 23 mg/dL   Creatinine, Ser 3.00 (*) 0.50 - 1.35 mg/dL   Calcium 8.5  8.4 - 10.5 mg/dL   Phosphorus 4.2  2.3 - 4.6 mg/dL   Albumin 2.2 (*) 3.5 - 5.2 g/dL   GFR calc non Af Amer 22 (*) >90 mL/min   GFR calc Af Amer 26 (*) >90 mL/min  GLUCOSE, CAPILLARY     Status: None   Collection Time    09/10/13  6:16 AM      Result Value Range   Glucose-Capillary 97  70 - 99 mg/dL    Time spent in discharge (includes decision making & examination of pt): >30 minutes  09/10/2013, 9:58 AM   Cherene Altes, MD [facility] Office  [phone] Pager [phone]  On-Call/Text Page:      [email or web address]      password [facility]

## 2013-09-12 LAB — CULTURE, BLOOD (ROUTINE X 2)
CULTURE: NO GROWTH
Culture: NO GROWTH

## 2013-09-15 LAB — PROTEIN ELECTROPHORESIS, SERUM
ALPHA-2-GLOBULIN: 19.7 % — AB (ref 7.1–11.8)
Albumin ELP: 40.5 % — ABNORMAL LOW (ref 55.8–66.1)
Alpha-1-Globulin: 6.3 % — ABNORMAL HIGH (ref 2.9–4.9)
Beta 2: 6.7 % — ABNORMAL HIGH (ref 3.2–6.5)
Beta Globulin: 16.1 % — ABNORMAL HIGH (ref 4.7–7.2)
Gamma Globulin: 10.7 % — ABNORMAL LOW (ref 11.1–18.8)
M-SPIKE, %: 0.15 g/dL
TOTAL PROTEIN ELP: 5.7 g/dL — AB (ref 6.0–8.3)

## 2013-09-21 ENCOUNTER — Inpatient Hospital Stay: Payer: Self-pay

## 2014-01-12 ENCOUNTER — Emergency Department (HOSPITAL_COMMUNITY)
Admission: EM | Admit: 2014-01-12 | Discharge: 2014-01-12 | Disposition: A | Discharge status: Home / Self Care | Payer: No Typology Code available for payment source | Attending: Emergency Medicine | Admitting: Emergency Medicine

## 2014-01-12 ENCOUNTER — Emergency Department (HOSPITAL_COMMUNITY): Payer: No Typology Code available for payment source

## 2014-01-12 ENCOUNTER — Encounter (HOSPITAL_COMMUNITY): Payer: Self-pay | Admitting: Emergency Medicine

## 2014-01-12 DIAGNOSIS — N189 Chronic kidney disease, unspecified: Secondary | ICD-10-CM | POA: Insufficient documentation

## 2014-01-12 DIAGNOSIS — I129 Hypertensive chronic kidney disease with stage 1 through stage 4 chronic kidney disease, or unspecified chronic kidney disease: Secondary | ICD-10-CM | POA: Insufficient documentation

## 2014-01-12 DIAGNOSIS — Z79899 Other long term (current) drug therapy: Secondary | ICD-10-CM | POA: Insufficient documentation

## 2014-01-12 DIAGNOSIS — E1169 Type 2 diabetes mellitus with other specified complication: Secondary | ICD-10-CM | POA: Insufficient documentation

## 2014-01-12 DIAGNOSIS — I509 Heart failure, unspecified: Secondary | ICD-10-CM | POA: Insufficient documentation

## 2014-01-12 DIAGNOSIS — E162 Hypoglycemia, unspecified: Secondary | ICD-10-CM

## 2014-01-12 LAB — I-STAT TROPONIN, ED: Troponin i, poc: 0.02 ng/mL (ref 0.00–0.08)

## 2014-01-12 LAB — COMPREHENSIVE METABOLIC PANEL
ALBUMIN: 2.2 g/dL — AB (ref 3.5–5.2)
ALT: 73 U/L — ABNORMAL HIGH (ref 0–53)
AST: 69 U/L — ABNORMAL HIGH (ref 0–37)
Alkaline Phosphatase: 212 U/L — ABNORMAL HIGH (ref 39–117)
BILIRUBIN TOTAL: 0.3 mg/dL (ref 0.3–1.2)
BUN: 37 mg/dL — AB (ref 6–23)
CHLORIDE: 107 meq/L (ref 96–112)
CO2: 22 mEq/L (ref 19–32)
CREATININE: 3.57 mg/dL — AB (ref 0.50–1.35)
Calcium: 8.7 mg/dL (ref 8.4–10.5)
GFR calc non Af Amer: 18 mL/min — ABNORMAL LOW (ref >90)
GFR, EST AFRICAN AMERICAN: 21 mL/min — AB (ref >90)
GLUCOSE: 54 mg/dL — AB (ref 70–99)
Potassium: 4.3 mEq/L (ref 3.7–5.3)
Sodium: 140 mEq/L (ref 137–147)
Total Protein: 6.5 g/dL (ref 6.0–8.3)

## 2014-01-12 LAB — PRO B NATRIURETIC PEPTIDE: Pro B Natriuretic peptide (BNP): 13600 pg/mL — ABNORMAL HIGH (ref 0–125)

## 2014-01-12 LAB — CBG MONITORING, ED
GLUCOSE-CAPILLARY: 60 mg/dL — AB (ref 70–99)
Glucose-Capillary: 95 mg/dL (ref 70–99)

## 2014-01-12 LAB — CBC WITH DIFFERENTIAL/PLATELET
BASOS PCT: 0 % (ref 0–1)
Basophils Absolute: 0 10*3/uL (ref 0.0–0.1)
EOS ABS: 0.2 10*3/uL (ref 0.0–0.7)
Eosinophils Relative: 1 % (ref 0–5)
HEMATOCRIT: 30 % — AB (ref 39.0–52.0)
HEMOGLOBIN: 10 g/dL — AB (ref 13.0–17.0)
LYMPHS ABS: 1.1 10*3/uL (ref 0.7–4.0)
Lymphocytes Relative: 8 % — ABNORMAL LOW (ref 12–46)
MCH: 30.1 pg (ref 26.0–34.0)
MCHC: 33.3 g/dL (ref 30.0–36.0)
MCV: 90.4 fL (ref 78.0–100.0)
MONOS PCT: 6 % (ref 3–12)
Monocytes Absolute: 0.7 10*3/uL (ref 0.1–1.0)
Neutro Abs: 10.7 10*3/uL — ABNORMAL HIGH (ref 1.7–7.7)
Neutrophils Relative %: 85 % — ABNORMAL HIGH (ref 43–77)
Platelets: 442 10*3/uL — ABNORMAL HIGH (ref 150–400)
RBC: 3.32 MIL/uL — ABNORMAL LOW (ref 4.22–5.81)
RDW: 14.3 % (ref 11.5–15.5)
WBC: 12.6 10*3/uL — ABNORMAL HIGH (ref 4.0–10.5)

## 2014-01-12 MED ORDER — FUROSEMIDE 10 MG/ML IJ SOLN
40.0000 mg | Freq: Once | INTRAMUSCULAR | Status: AC
  Administered 2014-01-12: 40 mg via INTRAVENOUS
  Filled 2014-01-12: qty 4

## 2014-01-12 MED ORDER — FUROSEMIDE 40 MG PO TABS: 40.0000 mg | ORAL_TABLET | Freq: Two times a day (BID) | ORAL | Status: DC

## 2014-01-12 MED ORDER — HYDRALAZINE HCL 10 MG PO TABS: 10.0000 mg | ORAL_TABLET | Freq: Three times a day (TID) | ORAL | Status: DC

## 2014-01-12 MED ORDER — ISOSORBIDE DINITRATE 30 MG PO TABS: 30.0000 mg | ORAL_TABLET | Freq: Three times a day (TID) | ORAL | Status: DC

## 2014-01-12 MED ORDER — DEXTROSE 50 % IV SOLN
50.0000 mL | Freq: Once | INTRAVENOUS | Status: AC
  Administered 2014-01-12: 50 mL via INTRAVENOUS
  Filled 2014-01-12: qty 50

## 2014-01-12 MED ORDER — METOPROLOL TARTRATE 100 MG PO TABS: 100.0000 mg | ORAL_TABLET | Freq: Every day | ORAL | Status: DC

## 2014-01-12 NOTE — ED Notes (Signed)
MD at bedside. 

## 2014-01-12 NOTE — ED Notes (Signed)
Patient transported to X-ray 

## 2014-01-12 NOTE — ED Provider Notes (Signed)
CSN: [id number]     Arrival date & time 01/12/14  1330 History   First MD Initiated Contact with Patient 01/12/14 1336     Chief Complaint  Patient presents with  . Hypoglycemia  . Leg Swelling     (Consider location/radiation/quality/duration/timing/severity/associated sxs/prior Treatment) Patient is a 53 y.o. male presenting with hypoglycemia and shortness of breath. The history is provided by the patient. No language interpreter was used.  Hypoglycemia Initial blood sugar:  37 Blood sugar after intervention:  82 Severity:  Moderate Onset quality:  Unable to specify Timing:  Unable to specify Progression:  Unable to specify Chronicity:  New Current diabetic therapy:  Glipizide Context: decreased oral intake   Relieved by:  Eating and IV glucose Ineffective treatments:  None tried Associated symptoms: shortness of breath, sweats and weakness   Associated symptoms: no dizziness, no speech difficulty, no syncope and no vomiting   Shortness of Breath Severity:  Moderate Onset quality:  Gradual Duration:  2 weeks Timing:  Intermittent Progression:  Waxing and waning Chronicity:  Recurrent Context: activity   Relieved by:  Rest Worsened by:  Exertion and movement Associated symptoms: diaphoresis   Associated symptoms: no abdominal pain, no chest pain, no cough, no fever, no headaches, no rash, no sputum production, no syncope and no vomiting   Associated symptoms comment:  Leg swelling  Risk factors comment:  CHF   Past Medical History  Diagnosis Date  . Diabetes mellitus     New onset  . Hypertension    Past Surgical History  Procedure Laterality Date  . Leg surgery    . Knee surgery    . Amputation  09/06/2011    Procedure: AMPUTATION RAY;  Surgeon: Wylene Simmer, MD;  Location: [facility];  Service: Orthopedics;  Laterality: Left;  Left Hallux Amputation   Family History  Problem Relation Age of Onset  . Adopted: Yes   History  Substance Use Topics  . Smoking  status: Never Smoker   . Smokeless tobacco: Not on file  . Alcohol Use: No    Review of Systems  Constitutional: Positive for diaphoresis. Negative for fever, activity change, appetite change and fatigue.  HENT: Negative for congestion, facial swelling, rhinorrhea and trouble swallowing.   Eyes: Negative for photophobia and pain.  Respiratory: Positive for shortness of breath. Negative for cough, sputum production and chest tightness.   Cardiovascular: Positive for leg swelling. Negative for chest pain and syncope.  Gastrointestinal: Negative for nausea, vomiting, abdominal pain, diarrhea and constipation.  Endocrine: Negative for polydipsia and polyuria.  Genitourinary: Negative for dysuria, urgency, decreased urine volume and difficulty urinating.  Musculoskeletal: Negative for back pain and gait problem.  Skin: Negative for color change, rash and wound.  Allergic/Immunologic: Negative for immunocompromised state.  Neurological: Negative for dizziness, facial asymmetry, speech difficulty, weakness, numbness and headaches.  Psychiatric/Behavioral: Negative for confusion, decreased concentration and agitation.      Allergies  Review of patient's allergies indicates no known allergies.  Home Medications   Prior to Admission medications   Medication Sig Start Date End Date Taking? Authorizing Provider  amLODipine (NORVASC) 10 MG tablet Take 1 tablet (10 mg total) by mouth daily. 09/10/13  Yes Cherene Altes, MD  glipiZIDE (GLUCOTROL) 10 MG tablet Take 1 tablet (10 mg total) by mouth 2 (two) times daily before a meal. 09/10/13  Yes Cherene Altes, MD  metoprolol succinate (TOPROL-XL) 25 MG 24 hr tablet Take 1 tablet (25 mg total) by mouth daily. 09/10/13  Yes Cherene Altes, MD   BP 151/100  Pulse 78  Temp(Src) 98.5 F (36.9 C) (Oral)  Resp 18  SpO2 98% Physical Exam  Constitutional: He is oriented to person, place, and time. He appears well-developed and well-nourished. No  distress.  HENT:  Head: Normocephalic and atraumatic.  Mouth/Throat: No oropharyngeal exudate.  Eyes: Pupils are equal, round, and reactive to light.  Neck: Normal range of motion. Neck supple.  Cardiovascular: Normal rate, regular rhythm and normal heart sounds.  Exam reveals no gallop and no friction rub.   No murmur heard. Pulmonary/Chest: Effort normal and breath sounds normal. No respiratory distress. He has no wheezes. He has no rales.  Abdominal: Soft. Bowel sounds are normal. He exhibits no distension and no mass. There is no tenderness. There is no rebound and no guarding.  Musculoskeletal: Normal range of motion. He exhibits edema. He exhibits no tenderness.  Neurological: He is alert and oriented to person, place, and time.  Skin: Skin is warm and dry.  Psychiatric: He has a normal mood and affect.    ED Course  Procedures (including critical care time) Labs Review Labs Reviewed  CBC WITH DIFFERENTIAL - Abnormal; Notable for the following:    WBC 12.6 (*)    RBC 3.32 (*)    Hemoglobin 10.0 (*)    HCT 30.0 (*)    Platelets 442 (*)    Neutrophils Relative % 85 (*)    Neutro Abs 10.7 (*)    Lymphocytes Relative 8 (*)    All other components within normal limits  COMPREHENSIVE METABOLIC PANEL - Abnormal; Notable for the following:    Glucose, Bld 54 (*)    BUN 37 (*)    Creatinine, Ser 3.57 (*)    Albumin 2.2 (*)    AST 69 (*)    ALT 73 (*)    Alkaline Phosphatase 212 (*)    GFR calc non Af Amer 18 (*)    GFR calc Af Amer 21 (*)    All other components within normal limits  PRO B NATRIURETIC PEPTIDE - Abnormal; Notable for the following:    Pro B Natriuretic peptide (BNP) 13600.0 (*)    All other components within normal limits  CBG MONITORING, ED - Abnormal; Notable for the following:    Glucose-Capillary 60 (*)    All other components within normal limits  I-STAT TROPOININ, ED  CBG MONITORING, ED    Imaging Review Dg Chest 2 View  01/12/2014   CLINICAL  DATA:  dyspnea, hx of CHF  EXAM: CHEST  2 VIEW  COMPARISON:  DG CHEST 1V PORT dated 09/06/2013  FINDINGS: Low lung volumes. Cardiac silhouette is enlarged. There is diffuse thickening of interstitial markings, peribronchial cuffing, indistinctness of the pulmonary vasculature. Areas of increased density within the lung bases and blunting of the costophrenic angles. No acute osseous abnormalities.  IMPRESSION: Pulmonary edema.  Small bilateral effusions.  Atelectasis versus infiltrate lung bases.   Electronically Signed   By: Margaree Mackintosh M.D.   On: 01/12/2014 14:53     EKG Interpretation   Date/Time:  Tuesday Jan 12 2014 14:34:01 EDT Ventricular Rate:  75 PR Interval:  130 QRS Duration: 84 QT Interval:  408 QTC Calculation: 456 R Axis:   79 Text Interpretation:  Sinus rhythm Ventricular premature complex Confirmed  by Hopkins  MD, Meno ([id number]) on 01/12/2014 2:39:04 PM      MDM   Final diagnoses:  CHF (congestive heart failure)  Hypoglycemia  CKD (  chronic kidney disease)    Pt is a 53 y.o. male with Pmhx as above who presents with hypoglycemia from [facility] where he went today for Rx refills. Also reports 2-3 weeks of BLLE edema, DOE.  No fever, chills, cough, CP. He has been out of his lasix, hydralazine, and Imdur for about 3 months.  On PE, VSS, pt in NAD. He has mild crackles at BL bases and 2+ BLLE edema to level of knees.  BNP elevated and CXR w/ CHF, but appears comfortable at rest, only drops to 95% on RA w/ ambulation. Trop negative. EKG w/o acute ischemic changes.  Dextrose and snack given with improvement of glucose. 40mg  IV lasix given and pt will be restarted on 40mg  BID lasix. Will ask him to f/u with Dr. Verl Blalock with CHF clinic at [facility] on Wednesdays. Cr also gradually worsening and will again refer him to [facility].  Case mgmt has been consulted. Return precautions given for new or worsening symptoms including CP, SOB, fevers, low glucose. Have  asked he skip evening glipizide today.          Neta Ehlers, MD 01/12/14 [phone]

## 2014-01-12 NOTE — Progress Notes (Signed)
ED CM consulted to meet with patient concerning f/u care.  Pt presented to [facility] ED with hypoglycemia and leg swelling. Pt has PMH of CHF, ARF ,Hyperglyciema, Met with patient at bedside, confirmed information. Pt states, he was signed up for [facility] by someone at the [facility] he does not have a card and has never paid a premium. Pt reports that he has not been compliant with meds because he has not been able to follow up with a doctor because of finances.  Discussed affordable Primary Care Practices in the local area. Pt interested in the [facility]. Brochure given with address and phone number. Offered to make f/u appt.Pt verbalized appreciation and is agreeable. Appt . [place] with Dr. Verl Blalock Wed 5/20 at 4p. Contacted the [facility] pharmacy, agreed to fill scripts.  Explained patient could take discharge prescriptions to [facility] pharmacy  Today. as per [place] [facility].  No Further ED CM needs

## 2014-01-12 NOTE — Discharge Instructions (Signed)
Heart Failure °Heart failure is a condition in which the heart has trouble pumping blood. This means your heart does not pump blood efficiently for your body to work well. In some cases of heart failure, fluid may back up into your lungs or you may have swelling (edema) in your lower legs. Heart failure is usually a long-term (chronic) condition. It is important for you to take good care of yourself and follow your caregiver's treatment plan. °CAUSES  °Some health conditions can cause heart failure. Those health conditions include: °· High blood pressure (hypertension) causes the heart muscle to work harder than normal. When pressure in the blood vessels is high, the heart needs to pump (contract) with more force in order to circulate blood throughout the body. High blood pressure eventually causes the heart to become stiff and weak. °· Coronary artery disease (CAD) is the buildup of cholesterol and fat (plaque) in the arteries of the heart. The blockage in the arteries deprives the heart muscle of oxygen and blood. This can cause chest pain and may lead to a heart attack. High blood pressure can also contribute to CAD. °· Heart attack (myocardial infarction) occurs when 1 or more arteries in the heart become blocked. The loss of oxygen damages the muscle tissue of the heart. When this happens, part of the heart muscle dies. The injured tissue does not contract as well and weakens the heart's ability to pump blood. °· Abnormal heart valves can cause heart failure when the heart valves do not open and close properly. This makes the heart muscle pump harder to keep the blood flowing. °· Heart muscle disease (cardiomyopathy or myocarditis) is damage to the heart muscle from a variety of causes. These can include drug or alcohol abuse, infections, or unknown reasons. These can increase the risk of heart failure. °· Lung disease makes the heart work harder because the lungs do not work properly. This can cause a strain  on the heart, leading it to fail. °· Diabetes increases the risk of heart failure. High blood sugar contributes to high fat (lipid) levels in the blood. Diabetes can also cause slow damage to tiny blood vessels that carry important nutrients to the heart muscle. When the heart does not get enough oxygen and food, it can cause the heart to become weak and stiff. This leads to a heart that does not contract efficiently. °· Other conditions can contribute to heart failure. These include abnormal heart rhythms, thyroid problems, and low blood counts (anemia). °Certain unhealthy behaviors can increase the risk of heart failure. Those unhealthy behaviors include: °· Being overweight. °· Smoking or chewing tobacco. °· Eating foods high in fat and cholesterol. °· Abusing illicit drugs or alcohol. °· Lacking physical activity. °SYMPTOMS  °Heart failure symptoms may vary and can be hard to detect. Symptoms may include: °· Shortness of breath with activity, such as climbing stairs. °· Persistent cough. °· Swelling of the feet, ankles, legs, or abdomen. °· Unexplained weight gain. °· Difficulty breathing when lying flat (orthopnea). °· Waking from sleep because of the need to sit up and get more air. °· Rapid heartbeat. °· Fatigue and loss of energy. °· Feeling lightheaded, dizzy, or close to fainting. °· Loss of appetite. °· Nausea. °· Increased urination during the night (nocturia). °DIAGNOSIS  °A diagnosis of heart failure is based on your history, symptoms, physical examination, and diagnostic tests. °Diagnostic tests for heart failure may include: °· Echocardiography. °· Electrocardiography. °· Chest X-ray. °· Blood tests. °· Exercise   stress test. °· Cardiac angiography. °· Radionuclide scans. °TREATMENT  °Treatment is aimed at managing the symptoms of heart failure. Medicines, behavioral changes, or surgical intervention may be necessary to treat heart failure. °· Medicines to help treat heart failure may  include: °· Angiotensin-converting enzyme (ACE) inhibitors. This type of medicine blocks the effects of a blood protein called angiotensin-converting enzyme. ACE inhibitors relax (dilate) the blood vessels and help lower blood pressure. °· Angiotensin receptor blockers. This type of medicine blocks the actions of a blood protein called angiotensin. Angiotensin receptor blockers dilate the blood vessels and help lower blood pressure. °· Water pills (diuretics). Diuretics cause the kidneys to remove salt and water from the blood. The extra fluid is removed through urination. This loss of extra fluid lowers the volume of blood the heart pumps. °· Beta blockers. These prevent the heart from beating too fast and improve heart muscle strength. °· Digitalis. This increases the force of the heartbeat. °· Healthy behavior changes include: °· Obtaining and maintaining a healthy weight. °· Stopping smoking or chewing tobacco. °· Eating heart healthy foods. °· Limiting or avoiding alcohol. °· Stopping illicit drug use. °· Physical activity as directed by your caregiver. °· Surgical treatment for heart failure may include: °· A procedure to open blocked arteries, repair damaged heart valves, or remove damaged heart muscle tissue. °· A pacemaker to improve heart muscle function and control certain abnormal heart rhythms. °· An internal cardioverter defibrillator to treat certain serious abnormal heart rhythms. °· A left ventricular assist device to assist the pumping ability of the heart. °HOME CARE INSTRUCTIONS  °· Take your medicine as directed by your caregiver. Medicines are important in reducing the workload of your heart, slowing the progression of heart failure, and improving your symptoms. °· Do not stop taking your medicine unless directed by your caregiver. °· Do not skip any dose of medicine. °· Refill your prescriptions before you run out of medicine. Your medicines are needed every day. °· Take over-the-counter  medicine only as directed by your caregiver or pharmacist. °· Engage in moderate physical activity if directed by your caregiver. Moderate physical activity can benefit some people. The elderly and people with severe heart failure should consult with a caregiver for physical activity recommendations. °· Eat heart healthy foods. Food choices should be free of trans fat and low in saturated fat, cholesterol, and salt (sodium). Healthy choices include fresh or frozen fruits and vegetables, fish, lean meats, legumes, fat-free or low-fat dairy products, and whole grain or high fiber foods. Talk to a dietitian to learn more about heart healthy foods. °· Limit sodium if directed by your caregiver. Sodium restriction may reduce symptoms of heart failure in some people. Talk to a dietitian to learn more about heart healthy seasonings. °· Use healthy cooking methods. Healthy cooking methods include roasting, grilling, broiling, baking, poaching, steaming, or stir-frying. Talk to a dietitian to learn more about healthy cooking methods. °· Limit fluids if directed by your caregiver. Fluid restriction may reduce symptoms of heart failure in some people. °· Weigh yourself every day. Daily weights are important in the early recognition of excess fluid. You should weigh yourself every morning after you urinate and before you eat breakfast. Wear the same amount of clothing each time you weigh yourself. Record your daily weight. Provide your caregiver with your weight record. °· Monitor and record your blood pressure if directed by your caregiver. °· Check your pulse if directed by your caregiver. °· Lose weight if directed   by your caregiver. Weight loss may reduce symptoms of heart failure in some people.  Stop smoking or chewing tobacco. Nicotine makes your heart work harder by causing your blood vessels to constrict. Do not use nicotine gum or patches before talking to your caregiver.  Schedule and attend follow-up visits as  directed by your caregiver. It is important to keep all your appointments.  Limit alcohol intake to no more than 1 drink per day for nonpregnant women and 2 drinks per day for men. Drinking more than that is harmful to your heart. Tell your caregiver if you drink alcohol several times a week. Talk with your caregiver about whether alcohol is safe for you. If your heart has already been damaged by alcohol or you have severe heart failure, drinking alcohol should be stopped completely.  Stop illicit drug use.  Stay up-to-date with immunizations. It is especially important to prevent respiratory infections through current pneumococcal and influenza immunizations.  Manage other health conditions such as hypertension, diabetes, thyroid disease, or abnormal heart rhythms as directed by your caregiver.  Learn to manage stress.  Plan rest periods when fatigued.  Learn strategies to manage high temperatures. If the weather is extremely hot:  Avoid vigorous physical activity.  Use air conditioning or fans or seek a cooler location.  Avoid caffeine and alcohol.  Wear loose-fitting, lightweight, and light-colored clothing.  Learn strategies to manage cold temperatures. If the weather is extremely cold:  Avoid vigorous physical activity.  Layer clothes.  Wear mittens or gloves, a hat, and a scarf when going outside.  Avoid alcohol.  Obtain ongoing education and support as needed.  Participate or seek rehabilitation as needed to maintain or improve independence and quality of life. SEEK MEDICAL CARE IF:   Your weight increases by 03 lb/1.4 kg in 1 day or 05 lb/2.3 kg in a week.  You have increasing shortness of breath that is unusual for you.  You are unable to participate in your usual physical activities.  You tire easily.  You cough more than normal, especially with physical activity.  You have any or more swelling in areas such as your hands, feet, ankles, or abdomen.  You  are unable to sleep because it is hard to breathe.  You feel like your heart is beating fast (palpitations).  You become dizzy or lightheaded upon standing up. SEEK IMMEDIATE MEDICAL CARE IF:   You have difficulty breathing.  There is a change in mental status such as decreased alertness or difficulty with concentration.  You have a pain or discomfort in your chest.  You have an episode of fainting (syncope). MAKE SURE YOU:   Understand these instructions.  Will watch your condition.  Will get help right away if you are not doing well or get worse. Document Released: 08/20/2005 Document Revised: 12/15/2012 Document Reviewed: 09/11/2012 [facility] Patient Information 2014 [place], [place].   Chronic Kidney Disease Chronic kidney disease occurs when the kidneys are damaged over a long period. The kidneys are two organs that lie on either side of the spine between the middle of the back and the front of the abdomen. The kidneys:   Remove wastes and extra water from the blood.   Produce important hormones. These help keep bones strong, regulate blood pressure, and help create red blood cells.   Balance the fluids and chemicals in the blood and tissues. A small amount of kidney damage may not cause problems, but a large amount of damage may make it difficult or  impossible for the kidneys to work the way they should. If steps are not taken to slow down the kidney damage or stop it from getting worse, the kidneys may stop working permanently. Most of the time, chronic kidney disease does not go away. However, it can often be controlled, and those with the disease can usually live normal lives. CAUSES  The most common causes of chronic kidney disease are diabetes and high blood pressure (hypertension). Chronic kidney disease may also be caused by:   Diseases that cause kidneys' filters to become inflamed.   Diseases that affect the immune system.   Genetic diseases.   Medicines  that damage the kidneys, such as anti-inflammatory medicines.  Poisoning or exposure to toxic substances.   A reoccurring kidney or urinary infection.   A problem with urine flow. This may be caused by:   Cancer.   Kidney stones.   An enlarged prostate in males. SYMPTOMS  Because the kidney damage in chronic kidney disease occurs slowly, symptoms develop slowly and may not be obvious until the kidney damage becomes severe. A person may have a kidney disease for years without showing any symptoms. Symptoms can include:   Swelling (edema) of the legs, ankles, or feet.   Tiredness (lethargy).   Nausea or vomiting.   Confusion.   Problems with urination, such as:   Decreased urine production.   Frequent urination, especially at night.   Frequent accidents in children who are potty trained.   Muscle twitches and cramps.   Shortness of breath.  Weakness.   Persistent itchiness.   Loss of appetite.  Metallic taste in the mouth.  Trouble sleeping.  Slowed development in children.  Short stature in children. DIAGNOSIS  Chronic kidney disease may be detected and diagnosed by tests, including blood, urine, imaging, or kidney biopsy tests.  TREATMENT  Most chronic kidney diseases cannot be cured. Treatment usually involves relieving symptoms and preventing or slowing the progression of the disease. Treatment may include:   A special diet. You may need to avoid alcohol and foods thatare salty and high in potassium.   Medicines. These may:   Lower blood pressure.   Relieve anemia.   Relieve swelling.   Protect the bones. HOME CARE INSTRUCTIONS   Follow your prescribed diet.   Only take over-the-counter or prescription medicines as directed by your caregiver.  Do not take any new medicines (prescription, over-the-counter, or nutritional supplements) unless approved by your caregiver. Many medicines can worsen your kidney damage or  need to have the dose adjusted.   Quit smoking if you are a smoker. Talk to your caregiver about a smoking cessation program.   Keep all follow-up appointments as directed by your caregiver. SEEK IMMEDIATE MEDICAL CARE IF:  Your symptoms get worse or you develop new symptoms.   You develop symptoms of end-stage kidney disease. These include:   Headaches.   Abnormally dark or light skin.   Numbness in the hands or feet.   Easy bruising.   Frequent hiccups.   Menstruation stops.   You have a fever.   You have decreased urine production.   You havepain or bleeding when urinating. MAKE SURE YOU:  Understand these instructions.  Will watch your condition.  Will get help right away if you are not doing well or get worse. FOR MORE INFORMATION  American Association of Kidney Patients: BombTimer.gl National Kidney Foundation: www.kidney.[place]: https://mathis.com/ [facility]: www.lifeoptions.org and www.kidneyschool.org Document Released: 05/29/2008 Document Revised:  08/06/2012 Document Reviewed: 04/18/2012 [employer] Patient Information 2014 [place], [place].  Hypoglycemia (Low Blood Sugar) Hypoglycemia is when the glucose (sugar) in your blood is too low. Hypoglycemia can happen for many reasons. It can happen to people with or without diabetes. Hypoglycemia can develop quickly and can be a medical emergency.  CAUSES  Having hypoglycemia does not mean that you will develop diabetes. Different causes include:  Missed or delayed meals or not enough carbohydrates eaten.  Medication overdose. This could be by accident or deliberate. If by accident, your medication may need to be adjusted or changed.  Exercise or increased activity without adjustments in carbohydrates or medications.  A nerve disorder that affects body functions like your heart rate, blood pressure and digestion (autonomic neuropathy).  A condition where the  stomach muscles do not function properly (gastroparesis). Therefore, medications may not absorb properly.  The inability to recognize the signs of hypoglycemia (hypoglycemic unawareness).  Absorption of insulin  may be altered.  Alcohol consumption.  Pregnancy/menstrual cycles/postpartum. This may be due to hormones.  Certain kinds of tumors. This is very rare. SYMPTOMS   Sweating.  Hunger.  Dizziness.  Blurred vision.  Drowsiness.  Weakness.  Headache.  Rapid heart beat.  Shakiness.  Nervousness. DIAGNOSIS  Diagnosis is made by monitoring blood glucose in one or all of the following ways:  Fingerstick blood glucose monitoring.  Laboratory results. TREATMENT  If you think your blood glucose is low:  Check your blood glucose, if possible. If it is less than 70 mg/dl, take one of the following:  3-4 glucose tablets.   cup juice (prefer clear like apple).   cup "regular" soda pop.  1 cup milk.  -1 tube of glucose gel.  5-6 hard candies.  Do not over treat because your blood glucose (sugar) will only go too high.  Wait 15 minutes and recheck your blood glucose. If it is still less than 70 mg/dl (or below your target range), repeat treatment.  Eat a snack if it is more than one hour until your next meal. Sometimes, your blood glucose may go so low that you are unable to treat yourself. You may need someone to help you. You may even pass out or be unable to swallow. This may require you to get an injection of glucagon, which raises the blood glucose. HOME CARE INSTRUCTIONS  Check blood glucose as recommended by your caregiver.  Take medication as prescribed by your caregiver.  Follow your meal plan. Do not skip meals. Eat on time.  If you are going to drink alcohol, drink it only with meals.  Check your blood glucose before driving.  Check your blood glucose before and after exercise. If you exercise longer or different than usual, be sure to  check blood glucose more frequently.  Always carry treatment with you. Glucose tablets are the easiest to carry.  Always wear medical alert jewelry or carry some form of identification that states that you have diabetes. This will alert people that you have diabetes. If you have hypoglycemia, they will have a better idea on what to do. SEEK MEDICAL CARE IF:   You are having problems keeping your blood sugar at target range.  You are having frequent episodes of hypoglycemia.  You feel you might be having side effects from your medicines.  You have symptoms of an illness that is not improving after 3-4 days.  You notice a change in vision or a new problem with your vision. [place]  IF:   You are a family member or friend of a person whose blood glucose goes below 70 mg/dl and is accompanied by:  Confusion.  A change in mental status.  The inability to swallow.  Passing out. Document Released: 08/20/2005 Document Revised: 11/12/2011 Document Reviewed: 12/17/2011 [facility] Patient Information 2014 [place], [place].

## 2014-01-12 NOTE — ED Notes (Signed)
Case Management to bedside.

## 2014-01-12 NOTE — ED Notes (Signed)
EPD notified of CBG

## 2014-01-12 NOTE — ED Notes (Signed)
53 yo male with CBG of 37 at the [facility] clinic today. Gave OJ and crackers came up to 60 now 82 with EMS. Currently out of all meds bp and diuerctics. Swollen legs Bilaterally with +4 pitting x 2 weeks. Abd swelling x 1 week. Pain 7/10.   150/90 HR 94

## 2014-01-20 ENCOUNTER — Encounter: Payer: Self-pay | Admitting: Cardiology

## 2014-01-20 ENCOUNTER — Ambulatory Visit: Payer: No Typology Code available for payment source | Attending: Cardiology | Admitting: Cardiology

## 2014-01-20 VITALS — BP 131/75 | HR 74 | Resp 20 | Ht 70.0 in | Wt 204.0 lb

## 2014-01-20 DIAGNOSIS — Z91199 Patient's noncompliance with other medical treatment and regimen due to unspecified reason: Secondary | ICD-10-CM | POA: Insufficient documentation

## 2014-01-20 DIAGNOSIS — E119 Type 2 diabetes mellitus without complications: Secondary | ICD-10-CM | POA: Insufficient documentation

## 2014-01-20 DIAGNOSIS — R609 Edema, unspecified: Secondary | ICD-10-CM | POA: Insufficient documentation

## 2014-01-20 DIAGNOSIS — Z9119 Patient's noncompliance with other medical treatment and regimen: Secondary | ICD-10-CM | POA: Insufficient documentation

## 2014-01-20 DIAGNOSIS — I509 Heart failure, unspecified: Secondary | ICD-10-CM | POA: Insufficient documentation

## 2014-01-20 DIAGNOSIS — N189 Chronic kidney disease, unspecified: Secondary | ICD-10-CM | POA: Insufficient documentation

## 2014-01-20 DIAGNOSIS — I5032 Chronic diastolic (congestive) heart failure: Secondary | ICD-10-CM

## 2014-01-20 DIAGNOSIS — I129 Hypertensive chronic kidney disease with stage 1 through stage 4 chronic kidney disease, or unspecified chronic kidney disease: Secondary | ICD-10-CM | POA: Insufficient documentation

## 2014-01-20 DIAGNOSIS — Z79899 Other long term (current) drug therapy: Secondary | ICD-10-CM | POA: Insufficient documentation

## 2014-01-20 MED ORDER — FUROSEMIDE 80 MG PO TABS
80.0000 mg | ORAL_TABLET | Freq: Two times a day (BID) | ORAL | Status: DC
Start: 2014-01-20 — End: 2014-08-09

## 2014-01-20 NOTE — Assessment & Plan Note (Signed)
He is most likely 20-25 pounds volume overloaded. I have increased his Lasix to 80 mg twice a day. We'll have him return in one week with Carmela Hurt RN for close followup and  response. We will need to check electrolytes at that time. Amlodipine could be exacerbating this and we'll have to address this in the future. Today his blood pressure and heart rate are pretty good. Did not adjust any other medications. I've asked him to eliminate salt as much as possible and only drink 1 1/2-2 L of fluid a day. We will obtain bathroom skills and have him weigh himself daily and bring his weight log  next week.

## 2014-01-20 NOTE — Progress Notes (Signed)
HPI [name] comes in today for followup of an ER visit for severe lower extremity edema.  Looking through his chart, he was discharged from the hospital in January of this year after an exacerbation of his chronic diastolic heart failure. He was supposed to followup with [place] at that time but did not calm. He is general noncompliant mostly from finances.  About 2 weeks ago he has severe swelling in his legs and also scrotal edema. He began back on his medications including his Lasix. He says the swelling is better but he still has significant scrotal edema. He denies orthopnea, PND.  He also is diabetic and apparently has some hypoglycemia addressed in the ER. He also has chronic kidney disease with a creatinine of 3.5. He was seen by nephrology in the hospital in January but has not followed up with them as well.  He does eat salt. He does not smoke or drink alcohol. He appears to want to be compliant after a good interview engaging him as a participant in his care.  Past Medical History  Diagnosis Date  . Diabetes mellitus     New onset  . Hypertension   . CHF (congestive heart failure)     Current Outpatient Prescriptions  Medication Sig Dispense Refill  . amLODipine (NORVASC) 10 MG tablet Take 1 tablet (10 mg total) by mouth daily.  30 tablet  0  . glipiZIDE (GLUCOTROL) 10 MG tablet Take 1 tablet (10 mg total) by mouth 2 (two) times daily before a meal.  60 tablet  0  . hydrALAZINE (APRESOLINE) 10 MG tablet Take 1 tablet (10 mg total) by mouth 3 (three) times daily.  90 tablet  0  . isosorbide dinitrate (ISORDIL) 30 MG tablet Take 1 tablet (30 mg total) by mouth 3 (three) times daily.  90 tablet  0  . metoprolol (LOPRESSOR) 100 MG tablet Take 1 tablet (100 mg total) by mouth daily.  30 tablet  0  . furosemide (LASIX) 80 MG tablet Take 1 tablet (80 mg total) by mouth 2 (two) times daily.  30 tablet  1  . metoprolol succinate (TOPROL-XL) 25 MG 24 hr tablet Take 1 tablet (25 mg total) by  mouth daily.  30 tablet  0   No current facility-administered medications for this visit.    No Known Allergies  Family History  Problem Relation Age of Onset  . Adopted: Yes    History   Social History  . Marital Status: Legally Separated    Spouse Name: N/A    Number of Children: N/A  . Years of Education: N/A   Occupational History  . Not on file.   Social History Main Topics  . Smoking status: Never Smoker   . Smokeless tobacco: Not on file  . Alcohol Use: No  . Drug Use: No  . Sexual Activity: Not on file   Other Topics Concern  . Not on file   Social History Narrative  . No narrative on file    ROS ALL NEGATIVE EXCEPT THOSE NOTED IN HPI  PE  General Appearance: well developed, well nourished in no acute distress, looks older than stated age 53: symmetrical face, PERRLA, good dentition  Neck: JVD 8 cm, thyromegaly, or adenopathy, trachea midline Chest: symmetric without deformity Cardiac: PMI non-displaced, RRR, normal S1, S2, no gallop or murmur Lung: clear to ausculation and percussion Vascular: Pulses are difficult to palpate in his feet because of anasarca. Scrotal edema Abdominal: nondistended, nontender, good bowel sounds,  no HSM, no bruits Extremities: no cyanosis, clubbing, 3+ edema to his knees, no weeping or blisters, no sign of DVT, no varicosities  Skin: normal color, no rashes Neuro: alert and oriented x 3, non-focal Pysch: normal affect  EKG  BMET    Component Value Date/Time   NA 140 01/12/2014 1410   K 4.3 01/12/2014 1410   CL 107 01/12/2014 1410   CO2 22 01/12/2014 1410   GLUCOSE 54* 01/12/2014 1410   BUN 37* 01/12/2014 1410   CREATININE 3.57* 01/12/2014 1410   CALCIUM 8.7 01/12/2014 1410   GFRNONAA 18* 01/12/2014 1410   GFRAA 21* 01/12/2014 1410    Lipid Panel     Component Value Date/Time   CHOL 127 09/06/2013 1342   TRIG 178* 09/06/2013 1342   HDL 38* 09/06/2013 1342   CHOLHDL 3.3 09/06/2013 1342   VLDL 36 09/06/2013 1342    LDLCALC 53 09/06/2013 1342    CBC    Component Value Date/Time   WBC 12.6* 01/12/2014 1410   RBC 3.32* 01/12/2014 1410   RBC 3.75* 08/24/2011 0535   HGB 10.0* 01/12/2014 1410   HCT 30.0* 01/12/2014 1410   PLT 442* 01/12/2014 1410   MCV 90.4 01/12/2014 1410   MCH 30.1 01/12/2014 1410   MCHC 33.3 01/12/2014 1410   RDW 14.3 01/12/2014 1410   LYMPHSABS 1.1 01/12/2014 1410   MONOABS 0.7 01/12/2014 1410   EOSABS 0.2 01/12/2014 1410   BASOSABS 0.0 01/12/2014 1410

## 2014-01-20 NOTE — Patient Instructions (Addendum)
Your lasix has been increased to 80 mg twice daily. Start weighing yourself every morning with minimal clothing and keep a weight journal. Please come back in a week for a nurse visit. Also, please come back to see Dr. Verl Blalock in 2 weeks.   Heart Failure  Heart failure means your heart has trouble pumping blood. This makes it hard for your body to work well. Heart failure is usually a long-term (chronic) condition. You must take good care of yourself and follow your doctor's treatment plan. HOME CARE  Take your heart medicine as told by your doctor.  Do not stop taking medicine unless your doctor tells you to.  Do not skip any dose of medicine.  Refill your medicines before they run out.  Take other medicines only as told by your doctor or pharmacist.  Stay active if told by your doctor. The elderly and people with severe heart failure should talk with a doctor about physical activity.  Eat heart healthy foods. Choose foods that are without trans fat and are low in saturated fat, cholesterol, and salt (sodium). This includes fresh or frozen fruits and vegetables, fish, lean meats, fat-free or low-fat dairy foods, whole grains, and high-fiber foods. Lentils and dried peas and beans (legumes) are also good choices.  Limit salt if told by your doctor.  Cook in a healthy way. Roast, grill, broil, bake, poach, steam, or stir-fry foods.  Limit fluids as told by your doctor.  Weigh yourself every morning. Do this after you pee (urinate) and before you eat breakfast. Write down your weight to give to your doctor.  Take your blood pressure and write it down if your doctor tell you to.  Ask your doctor how to check your pulse. Check your pulse as told.  Lose weight if told by your doctor.  Stop smoking or chewing tobacco. Do not use gum or patches that help you quit without your doctor's approval.  Schedule and go to doctor visits as told.  Nonpregnant women should have no more than 1  drink a day. Men should have no more than 2 drinks a day. Talk to your doctor about drinking alcohol.  Stop illegal drug use.  Stay current with shots (immunizations).  Manage your health conditions as told by your doctor.  Learn to manage your stress.  Rest when you are tired.  If it is really hot outside:  Avoid intense activities.  Use air conditioning or fans, or get in a cooler place.  Avoid caffeine and alcohol.  Wear loose-fitting, lightweight, and light-colored clothing.  If it is really cold outside:  Avoid intense activities.  Layer your clothing.  Wear mittens or gloves, a hat, and a scarf when going outside.  Avoid alcohol.  Learn about heart failure and get support as needed.  Get help to maintain or improve your quality of life and your ability to care for yourself as needed. GET HELP IF:   You gain 03 lb/1.4 kg or more in 1 day or 05 lb/2.3 kg in a week.  You are more short of breath than usual.  You cannot do your normal activities.  You tire easily.  You cough more than normal, especially with activity.  You have any or more puffiness (swelling) in areas such as your hands, feet, ankles, or belly (abdomen).  You cannot sleep because it is hard to breathe.  You feel like your heart is beating fast (palpitations).  You get dizzy or lightheaded when you stand up.  GET HELP RIGHT AWAY IF:   You have trouble breathing.  There is a change in mental status, such as becoming less alert or not being able to focus.  You have chest pain or discomfort.  You faint. MAKE SURE YOU:   Understand these instructions.  Will watch your condition.  Will get help right away if you are not doing well or get worse. Document Released: 05/29/2008 Document Revised: 12/15/2012 Document Reviewed: 03/20/2012 [facility] Patient Information 2014 [place], [place].

## 2014-01-20 NOTE — Progress Notes (Signed)
Patient for hospital follow-up. Patient in ED 01/12/14 with shortness of breath, hypoglycemia, and lower extremity swelling. Patient's feet and ankles tight and swollen today; however, patient indicates he has seen some improvement since he started taking Lasix again. Patient denies chest pain, shortness of breath, and headaches.

## 2014-01-27 ENCOUNTER — Encounter: Payer: Self-pay | Admitting: *Deleted

## 2014-01-27 ENCOUNTER — Other Ambulatory Visit: Payer: Self-pay

## 2014-02-03 ENCOUNTER — Ambulatory Visit: Payer: Self-pay | Admitting: Cardiology

## 2014-02-10 ENCOUNTER — Ambulatory Visit: Payer: Self-pay | Admitting: Cardiology

## 2014-02-24 ENCOUNTER — Ambulatory Visit: Payer: Self-pay | Admitting: Cardiology

## 2014-08-04 ENCOUNTER — Emergency Department (HOSPITAL_COMMUNITY): Payer: No Typology Code available for payment source

## 2014-08-04 ENCOUNTER — Encounter (HOSPITAL_COMMUNITY): Payer: Self-pay | Admitting: Emergency Medicine

## 2014-08-04 ENCOUNTER — Inpatient Hospital Stay (HOSPITAL_COMMUNITY)
Admission: EM | Admit: 2014-08-04 | Discharge: 2014-08-09 | DRG: 292 | Disposition: A | Discharge status: Home / Self Care | Payer: No Typology Code available for payment source | Attending: Internal Medicine | Admitting: Internal Medicine

## 2014-08-04 ENCOUNTER — Encounter (HOSPITAL_COMMUNITY): Payer: Self-pay | Admitting: *Deleted

## 2014-08-04 ENCOUNTER — Emergency Department (INDEPENDENT_AMBULATORY_CARE_PROVIDER_SITE_OTHER)
Admission: EM | Admit: 2014-08-04 | Discharge: 2014-08-04 | Disposition: A | Discharge status: Nursing Home (Includes ICF) | Payer: Self-pay | Source: Home / Self Care | Attending: Emergency Medicine | Admitting: Emergency Medicine

## 2014-08-04 DIAGNOSIS — N185 Chronic kidney disease, stage 5: Secondary | ICD-10-CM | POA: Diagnosis present

## 2014-08-04 DIAGNOSIS — Z89022 Acquired absence of left finger(s): Secondary | ICD-10-CM

## 2014-08-04 DIAGNOSIS — Z87891 Personal history of nicotine dependence: Secondary | ICD-10-CM

## 2014-08-04 DIAGNOSIS — D631 Anemia in chronic kidney disease: Secondary | ICD-10-CM | POA: Diagnosis present

## 2014-08-04 DIAGNOSIS — E119 Type 2 diabetes mellitus without complications: Secondary | ICD-10-CM | POA: Diagnosis present

## 2014-08-04 DIAGNOSIS — R079 Chest pain, unspecified: Secondary | ICD-10-CM

## 2014-08-04 DIAGNOSIS — K30 Functional dyspepsia: Secondary | ICD-10-CM | POA: Diagnosis present

## 2014-08-04 DIAGNOSIS — R0902 Hypoxemia: Secondary | ICD-10-CM

## 2014-08-04 DIAGNOSIS — I160 Hypertensive urgency: Secondary | ICD-10-CM | POA: Diagnosis present

## 2014-08-04 DIAGNOSIS — I5033 Acute on chronic diastolic (congestive) heart failure: Secondary | ICD-10-CM | POA: Diagnosis not present

## 2014-08-04 DIAGNOSIS — I10 Essential (primary) hypertension: Secondary | ICD-10-CM

## 2014-08-04 DIAGNOSIS — E1122 Type 2 diabetes mellitus with diabetic chronic kidney disease: Secondary | ICD-10-CM

## 2014-08-04 DIAGNOSIS — N2581 Secondary hyperparathyroidism of renal origin: Secondary | ICD-10-CM | POA: Diagnosis present

## 2014-08-04 DIAGNOSIS — I509 Heart failure, unspecified: Secondary | ICD-10-CM

## 2014-08-04 DIAGNOSIS — I5021 Acute systolic (congestive) heart failure: Secondary | ICD-10-CM | POA: Diagnosis not present

## 2014-08-04 DIAGNOSIS — N179 Acute kidney failure, unspecified: Secondary | ICD-10-CM | POA: Diagnosis present

## 2014-08-04 DIAGNOSIS — N189 Chronic kidney disease, unspecified: Secondary | ICD-10-CM | POA: Diagnosis present

## 2014-08-04 DIAGNOSIS — N184 Chronic kidney disease, stage 4 (severe): Secondary | ICD-10-CM | POA: Diagnosis present

## 2014-08-04 DIAGNOSIS — IMO0001 Reserved for inherently not codable concepts without codable children: Secondary | ICD-10-CM

## 2014-08-04 DIAGNOSIS — E1129 Type 2 diabetes mellitus with other diabetic kidney complication: Secondary | ICD-10-CM

## 2014-08-04 DIAGNOSIS — D472 Monoclonal gammopathy: Secondary | ICD-10-CM

## 2014-08-04 DIAGNOSIS — Z9114 Patient's other noncompliance with medication regimen: Secondary | ICD-10-CM | POA: Diagnosis present

## 2014-08-04 DIAGNOSIS — I120 Hypertensive chronic kidney disease with stage 5 chronic kidney disease or end stage renal disease: Secondary | ICD-10-CM | POA: Diagnosis present

## 2014-08-04 DIAGNOSIS — N289 Disorder of kidney and ureter, unspecified: Secondary | ICD-10-CM

## 2014-08-04 DIAGNOSIS — R0602 Shortness of breath: Secondary | ICD-10-CM

## 2014-08-04 DIAGNOSIS — I739 Peripheral vascular disease, unspecified: Secondary | ICD-10-CM | POA: Diagnosis present

## 2014-08-04 DIAGNOSIS — I5032 Chronic diastolic (congestive) heart failure: Secondary | ICD-10-CM

## 2014-08-04 DIAGNOSIS — D649 Anemia, unspecified: Secondary | ICD-10-CM | POA: Diagnosis present

## 2014-08-04 DIAGNOSIS — F1410 Cocaine abuse, uncomplicated: Secondary | ICD-10-CM | POA: Diagnosis present

## 2014-08-04 HISTORY — DX: Cocaine abuse, uncomplicated: F14.10

## 2014-08-04 HISTORY — DX: Unspecified diastolic (congestive) heart failure: I50.30

## 2014-08-04 HISTORY — DX: Peripheral vascular disease, unspecified: I73.9

## 2014-08-04 HISTORY — DX: Anemia, unspecified: D64.9

## 2014-08-04 LAB — BASIC METABOLIC PANEL
Anion gap: 14 (ref 5–15)
BUN: 42 mg/dL — AB (ref 6–23)
CALCIUM: 8.9 mg/dL (ref 8.4–10.5)
CO2: 21 mEq/L (ref 19–32)
Chloride: 106 mEq/L (ref 96–112)
Creatinine, Ser: 5.34 mg/dL — ABNORMAL HIGH (ref 0.50–1.35)
GFR, EST AFRICAN AMERICAN: 13 mL/min — AB (ref >90)
GFR, EST NON AFRICAN AMERICAN: 11 mL/min — AB (ref >90)
GLUCOSE: 57 mg/dL — AB (ref 70–99)
Potassium: 4.1 mEq/L (ref 3.7–5.3)
SODIUM: 141 meq/L (ref 137–147)

## 2014-08-04 LAB — GLUCOSE, CAPILLARY
Glucose-Capillary: 109 mg/dL — ABNORMAL HIGH (ref 70–99)
Glucose-Capillary: 144 mg/dL — ABNORMAL HIGH (ref 70–99)

## 2014-08-04 LAB — RAPID URINE DRUG SCREEN, HOSP PERFORMED
AMPHETAMINES: NOT DETECTED
Barbiturates: NOT DETECTED
Benzodiazepines: NOT DETECTED
COCAINE: POSITIVE — AB
OPIATES: NOT DETECTED
TETRAHYDROCANNABINOL: NOT DETECTED

## 2014-08-04 LAB — URINALYSIS, ROUTINE W REFLEX MICROSCOPIC
Bilirubin Urine: NEGATIVE
Glucose, UA: 100 mg/dL — AB
Ketones, ur: NEGATIVE mg/dL
LEUKOCYTES UA: NEGATIVE
NITRITE: NEGATIVE
PH: 7 (ref 5.0–8.0)
Specific Gravity, Urine: 1.01 (ref 1.005–1.030)
Urobilinogen, UA: 0.2 mg/dL (ref 0.0–1.0)

## 2014-08-04 LAB — HEPATIC FUNCTION PANEL
ALBUMIN: 2.1 g/dL — AB (ref 3.5–5.2)
ALK PHOS: 160 U/L — AB (ref 39–117)
ALT: 22 U/L (ref 0–53)
AST: 27 U/L (ref 0–37)
Bilirubin, Direct: <0.2 mg/dL (ref 0.0–0.3)
Total Bilirubin: 0.7 mg/dL (ref 0.3–1.2)
Total Protein: 7.1 g/dL (ref 6.0–8.3)

## 2014-08-04 LAB — CBC WITH DIFFERENTIAL/PLATELET
Basophils Absolute: 0.1 10*3/uL (ref 0.0–0.1)
Basophils Relative: 1 % (ref 0–1)
Eosinophils Absolute: 0.7 10*3/uL (ref 0.0–0.7)
Eosinophils Relative: 6 % — ABNORMAL HIGH (ref 0–5)
HEMATOCRIT: 29.4 % — AB (ref 39.0–52.0)
HEMOGLOBIN: 9.5 g/dL — AB (ref 13.0–17.0)
LYMPHS ABS: 1.7 10*3/uL (ref 0.7–4.0)
LYMPHS PCT: 16 % (ref 12–46)
MCH: 29.7 pg (ref 26.0–34.0)
MCHC: 32.3 g/dL (ref 30.0–36.0)
MCV: 91.9 fL (ref 78.0–100.0)
MONO ABS: 0.7 10*3/uL (ref 0.1–1.0)
MONOS PCT: 6 % (ref 3–12)
NEUTROS ABS: 7.8 10*3/uL — AB (ref 1.7–7.7)
Neutrophils Relative %: 71 % (ref 43–77)
Platelets: 470 10*3/uL — ABNORMAL HIGH (ref 150–400)
RBC: 3.2 MIL/uL — ABNORMAL LOW (ref 4.22–5.81)
RDW: 14.8 % (ref 11.5–15.5)
WBC: 11 10*3/uL — AB (ref 4.0–10.5)

## 2014-08-04 LAB — LIPID PANEL
CHOL/HDL RATIO: 2.5 ratio
Cholesterol: 139 mg/dL (ref 0–200)
HDL: 56 mg/dL (ref >39)
LDL Cholesterol: 67 mg/dL (ref 0–99)
TRIGLYCERIDES: 82 mg/dL (ref <150)
VLDL: 16 mg/dL (ref 0–40)

## 2014-08-04 LAB — URINE MICROSCOPIC-ADD ON

## 2014-08-04 LAB — PRO B NATRIURETIC PEPTIDE: Pro B Natriuretic peptide (BNP): 52226 pg/mL — ABNORMAL HIGH (ref 0–125)

## 2014-08-04 LAB — RETICULOCYTES
RBC.: 3.24 MIL/uL — AB (ref 4.22–5.81)
RETIC CT PCT: 1.7 % (ref 0.4–3.1)
Retic Count, Absolute: 55.1 10*3/uL (ref 19.0–186.0)

## 2014-08-04 LAB — TSH: TSH: 1.82 u[IU]/mL (ref 0.350–4.500)

## 2014-08-04 LAB — IRON AND TIBC
IRON: 34 ug/dL — AB (ref 42–135)
Saturation Ratios: 14 % — ABNORMAL LOW (ref 20–55)
TIBC: 239 ug/dL (ref 215–435)
UIBC: 205 ug/dL (ref 125–400)

## 2014-08-04 LAB — TROPONIN I: Troponin I: <0.3 ng/mL (ref <0.30)

## 2014-08-04 LAB — I-STAT TROPONIN, ED: Troponin i, poc: 0.08 ng/mL (ref 0.00–0.08)

## 2014-08-04 LAB — HEMOGLOBIN A1C
Hgb A1c MFr Bld: 5.5 % (ref <5.7)
Mean Plasma Glucose: 111 mg/dL (ref <117)

## 2014-08-04 MED ORDER — ACETAMINOPHEN 650 MG RE SUPP: 650.0000 mg | Freq: Four times a day (QID) | RECTAL | Status: DC | PRN

## 2014-08-04 MED ORDER — NITROGLYCERIN 2 % TD OINT
1.0000 [in_us] | TOPICAL_OINTMENT | Freq: Once | TRANSDERMAL | Status: AC
  Administered 2014-08-04: 1 [in_us] via TOPICAL
  Filled 2014-08-04: qty 1

## 2014-08-04 MED ORDER — CLONIDINE HCL 0.1 MG PO TABS
0.1000 mg | ORAL_TABLET | Freq: Three times a day (TID) | ORAL | Status: DC
  Administered 2014-08-04 (×2): 0.1 mg via ORAL
  Filled 2014-08-04 (×5): qty 1

## 2014-08-04 MED ORDER — HEPARIN SODIUM (PORCINE) 5000 UNIT/ML IJ SOLN
5000.0000 [IU] | Freq: Three times a day (TID) | INTRAMUSCULAR | Status: DC
  Administered 2014-08-04 – 2014-08-09 (×14): 5000 [IU] via SUBCUTANEOUS
  Filled 2014-08-04 (×17): qty 1

## 2014-08-04 MED ORDER — FUROSEMIDE 10 MG/ML IJ SOLN
80.0000 mg | Freq: Once | INTRAMUSCULAR | Status: AC
  Administered 2014-08-04: 80 mg via INTRAVENOUS
  Filled 2014-08-04: qty 8

## 2014-08-04 MED ORDER — FUROSEMIDE 10 MG/ML IJ SOLN
80.0000 mg | Freq: Three times a day (TID) | INTRAMUSCULAR | Status: DC
  Administered 2014-08-04 – 2014-08-05 (×5): 80 mg via INTRAVENOUS
  Filled 2014-08-04 (×8): qty 8

## 2014-08-04 MED ORDER — METOPROLOL TARTRATE 100 MG PO TABS
100.0000 mg | ORAL_TABLET | Freq: Two times a day (BID) | ORAL | Status: DC
  Administered 2014-08-04 – 2014-08-05 (×3): 100 mg via ORAL
  Filled 2014-08-04: qty 1
  Filled 2014-08-04: qty 4
  Filled 2014-08-04 (×2): qty 1

## 2014-08-04 MED ORDER — METOPROLOL SUCCINATE ER 25 MG PO TB24: 25.0000 mg | ORAL_TABLET | Freq: Every day | ORAL | Status: DC

## 2014-08-04 MED ORDER — ACETAMINOPHEN 325 MG PO TABS
650.0000 mg | ORAL_TABLET | Freq: Four times a day (QID) | ORAL | Status: DC | PRN
  Administered 2014-08-06: 650 mg via ORAL
  Filled 2014-08-04: qty 2

## 2014-08-04 MED ORDER — HYDRALAZINE HCL 10 MG PO TABS
10.0000 mg | ORAL_TABLET | Freq: Three times a day (TID) | ORAL | Status: DC
Start: 2014-08-04 — End: 2014-08-04

## 2014-08-04 MED ORDER — ISOSORBIDE MONONITRATE ER 30 MG PO TB24
30.0000 mg | ORAL_TABLET | Freq: Every day | ORAL | Status: DC
  Administered 2014-08-04 – 2014-08-06 (×3): 30 mg via ORAL
  Filled 2014-08-04 (×4): qty 1

## 2014-08-04 MED ORDER — HYDRALAZINE HCL 20 MG/ML IJ SOLN
5.0000 mg | Freq: Once | INTRAMUSCULAR | Status: AC
  Administered 2014-08-04: 5 mg via INTRAVENOUS
  Filled 2014-08-04: qty 1

## 2014-08-04 MED ORDER — AMLODIPINE BESYLATE 10 MG PO TABS
10.0000 mg | ORAL_TABLET | Freq: Every day | ORAL | Status: DC
  Administered 2014-08-04 – 2014-08-06 (×3): 10 mg via ORAL
  Filled 2014-08-04 (×3): qty 1

## 2014-08-04 MED ORDER — SODIUM CHLORIDE 0.9 % IV SOLN: INTRAVENOUS | Status: DC

## 2014-08-04 MED ORDER — HYDRALAZINE HCL 50 MG PO TABS
50.0000 mg | ORAL_TABLET | Freq: Three times a day (TID) | ORAL | Status: DC
  Administered 2014-08-04 – 2014-08-06 (×5): 50 mg via ORAL
  Filled 2014-08-04 (×10): qty 1

## 2014-08-04 NOTE — ED Notes (Addendum)
Pt  Reports   Shortness of  Breath            bp is  Elevated        Non   Compliant  On  meds       - stopped  Going  To the  [facility]       Sitting  Upright  On  Exam table           Speaking  In  Complete   sentances

## 2014-08-04 NOTE — ED Provider Notes (Signed)
CSN: [id number]     Arrival date & time 08/04/14  0957 History   First MD Initiated Contact with Patient 08/04/14 1009     Chief Complaint  Patient presents with  . Shortness of Breath     (Consider location/radiation/quality/duration/timing/severity/associated sxs/prior Treatment) Patient is a 53 y.o. male presenting with shortness of breath. The history is provided by the patient (the pt complains of dyspnea).  Shortness of Breath Severity:  Moderate Onset quality:  Gradual Timing:  Constant Progression:  Unchanged Chronicity:  Recurrent Context: activity   Associated symptoms: no abdominal pain, no chest pain, no cough, no headaches and no rash     Past Medical History  Diagnosis Date  . Diabetes mellitus     New onset  . Hypertension   . CHF (congestive heart failure)    Past Surgical History  Procedure Laterality Date  . Leg surgery    . Knee surgery    . Amputation  09/06/2011    Procedure: AMPUTATION RAY;  Surgeon: Wylene Simmer, MD;  Location: [facility];  Service: Orthopedics;  Laterality: Left;  Left Hallux Amputation   Family History  Problem Relation Age of Onset  . Adopted: Yes   History  Substance Use Topics  . Smoking status: Never Smoker   . Smokeless tobacco: Not on file  . Alcohol Use: No    Review of Systems  Constitutional: Negative for appetite change and fatigue.  HENT: Negative for congestion, ear discharge and sinus pressure.   Eyes: Negative for discharge.  Respiratory: Positive for shortness of breath. Negative for cough.   Cardiovascular: Negative for chest pain.  Gastrointestinal: Negative for abdominal pain and diarrhea.  Genitourinary: Negative for frequency and hematuria.  Musculoskeletal: Negative for back pain.       Leg swelling  Skin: Negative for rash.  Neurological: Negative for seizures and headaches.  Psychiatric/Behavioral: Negative for hallucinations.      Allergies  Review of patient's allergies indicates no known  allergies.  Home Medications   Prior to Admission medications   Medication Sig Start Date End Date Taking? Authorizing Provider  furosemide (LASIX) 20 MG tablet Take 20 mg by mouth 2 (two) times daily.   Yes Historical Provider, MD  glipiZIDE (GLUCOTROL) 10 MG tablet Take 1 tablet (10 mg total) by mouth 2 (two) times daily before a meal. 09/10/13  Yes Cherene Altes, MD  amLODipine (NORVASC) 10 MG tablet Take 1 tablet (10 mg total) by mouth daily. Patient not taking: Reported on 08/04/2014 09/10/13   Cherene Altes, MD  furosemide (LASIX) 80 MG tablet Take 1 tablet (80 mg total) by mouth 2 (two) times daily. Patient not taking: Reported on 08/04/2014 01/20/14   Renella Cunas, MD  hydrALAZINE (APRESOLINE) 10 MG tablet Take 1 tablet (10 mg total) by mouth 3 (three) times daily. Patient not taking: Reported on 08/04/2014 01/12/14   Linus Mako, PA-C  isosorbide dinitrate (ISORDIL) 30 MG tablet Take 1 tablet (30 mg total) by mouth 3 (three) times daily. Patient not taking: Reported on 08/04/2014 01/12/14   Linus Mako, PA-C  isosorbide mononitrate (IMDUR) 30 MG 24 hr tablet Take 30 mg by mouth daily.    Historical Provider, MD  lisinopril (PRINIVIL,ZESTRIL) 5 MG tablet Take 5 mg by mouth daily.    Historical Provider, MD  metoprolol (LOPRESSOR) 100 MG tablet Take 1 tablet (100 mg total) by mouth daily. Patient not taking: Reported on 08/04/2014 01/12/14   Linus Mako, PA-C  metoprolol  succinate (TOPROL-XL) 25 MG 24 hr tablet Take 25 mg by mouth daily.    Historical Provider, MD   BP 191/120 mmHg  Pulse 110  Temp(Src) 98.4 F (36.9 C) (Oral)  Resp 21  Ht 5\' 11"  (1.803 m)  Wt 199 lb (90.266 kg)  BMI 27.77 kg/m2  SpO2 99% Physical Exam  Constitutional: He is oriented to person, place, and time. He appears well-developed.  HENT:  Head: Normocephalic.  Eyes: Conjunctivae and EOM are normal. No scleral icterus.  Neck: Neck supple. No thyromegaly present.  Cardiovascular: Normal  rate and regular rhythm.  Exam reveals no gallop and no friction rub.   No murmur heard. Pulmonary/Chest: No stridor. He has wheezes. He has no rales. He exhibits no tenderness.  Abdominal: He exhibits no distension. There is no tenderness. There is no rebound.  Musculoskeletal: Normal range of motion. He exhibits edema.  Lymphadenopathy:    He has no cervical adenopathy.  Neurological: He is oriented to person, place, and time. He exhibits normal muscle tone. Coordination normal.  Skin: No rash noted. No erythema.  Psychiatric: He has a normal mood and affect. His behavior is normal.    ED Course  Procedures (including critical care time) Labs Review Labs Reviewed  BASIC METABOLIC PANEL - Abnormal; Notable for the following:    Glucose, Bld 57 (*)    BUN 42 (*)    Creatinine, Ser 5.34 (*)    GFR calc non Af Amer 11 (*)    GFR calc Af Amer 13 (*)    All other components within normal limits  PRO B NATRIURETIC PEPTIDE - Abnormal; Notable for the following:    Pro B Natriuretic peptide (BNP) 52226.0 (*)    All other components within normal limits  HEPATIC FUNCTION PANEL - Abnormal; Notable for the following:    Albumin 2.1 (*)    Alkaline Phosphatase 160 (*)    All other components within normal limits  CBC WITH DIFFERENTIAL - Abnormal; Notable for the following:    WBC 11.0 (*)    RBC 3.20 (*)    Hemoglobin 9.5 (*)    HCT 29.4 (*)    Platelets 470 (*)    Neutro Abs 7.8 (*)    Eosinophils Relative 6 (*)    All other components within normal limits  Randolm Idol, ED    Imaging Review Dg Chest Port 1 View  08/04/2014   CLINICAL DATA:  Shortness of breath. Chest pain. Initial encounter.  EXAM: PORTABLE CHEST - 1 VIEW  COMPARISON:  01/12/2014 and 09/06/2013.  FINDINGS: 1013 hr. The heart size and mediastinal contours are stable. There are chronic low lung volumes with associated bibasilar pulmonary opacities. Previously demonstrated bilateral pleural effusions have nearly  completely resolved. There is no new airspace disease or pneumothorax. The osseous structures appear unchanged.  IMPRESSION: Overall improved aeration of the lung bases and decreased pleural effusions compared with prior examination. No acute findings demonstrated.   Electronically Signed   By: Camie Patience M.D.   On: 08/04/2014 10:57     EKG Interpretation   Date/Time:  Wednesday August 04 2014 10:01:43 EST Ventricular Rate:  121 PR Interval:  119 QRS Duration: 88 QT Interval:  335 QTC Calculation: 475 R Axis:   69 Text Interpretation:  Age not entered, assumed to be  53 years old for  purpose of ECG interpretation Sinus tachycardia Nonspecific T  abnormalities, lateral leads Borderline prolonged QT interval Confirmed by  Andreas Sobolewski  MD, Zhi Geier [id number]) on  08/04/2014 12:27:05 PM      MDM   Final diagnoses:  None    Admit chf and htn   Maudry Diego, MD 08/04/14 1228

## 2014-08-04 NOTE — ED Notes (Signed)
Internal Med at bedside.

## 2014-08-04 NOTE — H&P (Signed)
Date: 08/04/2014               Patient Name:  [name] MRN: [id number]  DOB: 10/26/1960 Age / Sex: 53 y.o., male   PCP: Arnoldo Morale, MD         Medical Service: Internal Medicine Teaching Service         Attending Physician: Dr. Larey Dresser    First Contact: Dr. Venita Lick Pager: [phone]  Second Contact: Dr. Michail Jewels Pager: [phone]       After Hours (After 5p/  First Contact Pager: [phone]  weekends / holidays): Second Contact Pager: [phone]   Chief Complaint: hypertension (taken by RN), lower extremity swelling and shortness of breath  History of Present Illness:  [name] is a 53 yo man with a history of chronic diastolic heart failure (NYHA class IV), hypertension, DMII and stage IV CKD who presented with hypertension and shortness of breath. He reports "feeling funny" for the past week. Finally, yesterday, he was with an RN who took his blood pressure and found it to be 220/140. Around this time, he noted a frontal headache, seeing black spots floating down and out in each of his eyes, and substernal chest pain that was faint but felt like needles sticking him. He also noted shortness of breath when he would walk. He typically takes multiple blood pressure medications, several of which he had run out of one week ago (amlodipine, hydralazine, metoprolol and lisinopril).   In the ED, his BP was elevated to 240/176; he was given IV hydralazine and IV lasix.   Meds: No current facility-administered medications for this encounter.   Current Outpatient Prescriptions  Medication Sig Dispense Refill  . furosemide (LASIX) 20 MG tablet Take 20 mg by mouth 2 (two) times daily.    Marland Kitchen glipiZIDE (GLUCOTROL) 10 MG tablet Take 1 tablet (10 mg total) by mouth 2 (two) times daily before a meal. 60 tablet 0  . amLODipine (NORVASC) 10 MG tablet Take 1 tablet (10 mg total) by mouth daily. (Patient not taking: Reported on 08/04/2014) 30 tablet 0  . furosemide  (LASIX) 80 MG tablet Take 1 tablet (80 mg total) by mouth 2 (two) times daily. (Patient not taking: Reported on 08/04/2014) 30 tablet 1  . hydrALAZINE (APRESOLINE) 10 MG tablet Take 1 tablet (10 mg total) by mouth 3 (three) times daily. (Patient not taking: Reported on 08/04/2014) 90 tablet 0  . isosorbide dinitrate (ISORDIL) 30 MG tablet Take 1 tablet (30 mg total) by mouth 3 (three) times daily. (Patient not taking: Reported on 08/04/2014) 90 tablet 0  . isosorbide mononitrate (IMDUR) 30 MG 24 hr tablet Take 30 mg by mouth daily.    Marland Kitchen lisinopril (PRINIVIL,ZESTRIL) 5 MG tablet Take 5 mg by mouth daily.    . metoprolol (LOPRESSOR) 100 MG tablet Take 1 tablet (100 mg total) by mouth daily. (Patient not taking: Reported on 08/04/2014) 30 tablet 0  . metoprolol succinate (TOPROL-XL) 25 MG 24 hr tablet Take 25 mg by mouth daily.      Allergies: Allergies as of 08/04/2014  . (No Known Allergies)   Past Medical History  Diagnosis Date  . Diabetes mellitus     New onset  . Hypertension   . CHF (congestive heart failure)    Past Surgical History  Procedure Laterality Date  . Leg surgery    . Knee surgery    . Amputation  09/06/2011    Procedure: AMPUTATION RAY;  Surgeon: Wylene Simmer, MD;  Location: [facility];  Service: Orthopedics;  Laterality: Left;  Left Hallux Amputation   Family History  Problem Relation Age of Onset  . Adopted: Yes, but biological father had heart disease; sister with diabetes   History   Social History  . Marital Status: Legally Separated    Spouse Name: N/A    Number of Children: N/A  . Years of Education: N/A   Occupational History  . Not on file.   Social History Main Topics  . Smoking status: Never Smoker   . Smokeless tobacco: Not on file  . Alcohol Use: Formerly; quit now  . Drug Use: No  . Sexual Activity: Not on file   Other Topics Concern  . Lives with fiance and two daughters Works at [employer], Sales executive   Social History Narrative      Review of Systems: General: has been feeling funny for past week, no recent illness, 19 lb weight loss over past several weeks, somewhat intentional HEENT: +increased congestion and liquid coming from mouth, + seeing black spots, no cough Cardiac: + occasional needles in substernal area Respiratory: + occasional SOB with walking GI: last BM last night Urinary: + hesitency  Physical Exam: Blood pressure 183/109, pulse 106, temperature 98.4 F (36.9 C), temperature source Oral, resp. rate 9, height 5\' 11"  (1.803 m), weight 199 lb (90.266 kg), SpO2 98 %. Appearance: in NAD, non-sweaty, lying in bed at 40 degrees, did not become short of breath on lying down for exam HEENT: no periorbital edema, [place]/AT, PERRL, EOMi Heart: tachycardic, normal S1S2, JVD present Lungs: CTAB with decreased lung sounds at bases Abdomen: BS+, soft, nontender Musculoskeletal: several areas of hyperpigmented skin on back Extremities: 2+ pitting edema BLE, left great toe amputated, small open wound left knee Neurologic: A&Ox3, appropriately interactive, grossly intact Skin: no rashes or lesions  Lab results: Basic Metabolic Panel:  Recent Labs  08/04/14 1007  NA 141  K 4.1  CL 106  CO2 21  GLUCOSE 57*  BUN 42*  CREATININE 5.34*  CALCIUM 8.9   Liver Function Tests:  Recent Labs  08/04/14 1007  AST 27  ALT 22  ALKPHOS 160*  BILITOT 0.7  PROT 7.1  ALBUMIN 2.1*   CBC:  Recent Labs  08/04/14 1018  WBC 11.0*  NEUTROABS 7.8*  HGB 9.5*  HCT 29.4*  MCV 91.9  PLT 470*   BNP:  Recent Labs  08/04/14 1007  PROBNP 52226.0*   Imaging results:  Dg Chest Port 1 View  08/04/2014   CLINICAL DATA:  Shortness of breath. Chest pain. Initial encounter.  EXAM: PORTABLE CHEST - 1 VIEW  COMPARISON:  01/12/2014 and 09/06/2013.  FINDINGS: 1013 hr. The heart size and mediastinal contours are stable. There are chronic low lung volumes with associated bibasilar pulmonary opacities. Previously  demonstrated bilateral pleural effusions have nearly completely resolved. There is no new airspace disease or pneumothorax. The osseous structures appear unchanged.  IMPRESSION: Overall improved aeration of the lung bases and decreased pleural effusions compared with prior examination. No acute findings demonstrated.   Electronically Signed   By: Camie Patience M.D.   On: 08/04/2014 10:57    Other results: EKG: sinus tachycardia at 121, normal axis, borderline long QTc 475, no acute ST or T wave abnormalities  Assessment & Plan by Problem: Active Problems:   Hypertension   Chronic diastolic HF (heart failure), NYHA class 4   Diabetes mellitus   CKD (chronic kidney disease), stage IV  [name] is  a 53 yo man with chronic diastolic heart failure, chronic stage IV renal failure and hypertension who was admitted for evaluation of acutely high blood pressure, leg swelling and shortness of breath in the setting of missing his medications for a week. He had malignant hypertension on admission and is in diastolic heart failure.  Malignant Hypertension: Likely secondary to medication withdrawal. BP to 240/176 in the ED. ProBNP 52226. I-stat troponin 0.08. Home medications: amlodipine 10 mg daily, hydralazine 10 mg TID, lisinopril 5 mg, metoprolol XL 25 mg  - Metoprolol tartrate 50-100 mg BID - Hydralazine 25-50 mg 4x per day - Clonidine 0.1 mg q8 hours - IV lasix 80 mg TID - Appreciate cardiology following - UDS - Monitor vitals closely  - Frequent neuro checks (q4hours)  NYHA Class 4 Chronic Diastolic Heart Failure: BNP elevated to 52226 (in the setting of CKD and malignant htn). Significant LE edema. That said, weight down 19 lbs from last admission (may be somewhat intentional) and CXR showed improved aeration of the lung bases and decreased pleural effusions compared to last.. Evaluated in 2014 with normal systolic function and LVEF >55%. Home lasix 80 mg BID or 20 mg BID (both in med  rec).  - IV lasix 80 mg q8hours - 2D echo ordered - Repeat EKG - Tele - Trend troponins; negative x1 - Lipid panel pending - Better plan for outpatient medication ascertainment  Acute on chronic CKD (stage IV): There is evidence of kidney injury progression; likely at least partly secondary to malignant hypertension. Baseline SCr~3.57, now 5.34.   - Trend creatinine - Nephrology consult  Chest Pain: Has a history of angina; on home imdur and isordil - Trending troponins - Repeat EKG - Continue home imdur and isordil   Normocytic Anemia: 9.5. Basline ~9.7. No active bleeding - FOBT - Will need colonoscopy as outpatient  DMII: Last A1c 7.0% in 09/2013 - ISS - Check HgbA1c  Patient Running out of Medications: - Consult to Care Management; patient [facility]); many financial hardships; believes he may lose his job  Diet:  - Renal/carb mod  DVT Ppx: - [place] heparin  Dispo: Disposition is deferred at this time, awaiting improvement of current medical problems. Anticipated discharge in approximately 2 day(s).   The patient does have a current PCP (Arnoldo Morale, MD) and does not need an [facility] hospital follow-up appointment after discharge. Has been seen by [facility].  The patient does not have transportation limitations that hinder transportation to clinic appointments.  Signed: Drucilla Schmidt, MD 08/04/2014, 2:33 PM

## 2014-08-04 NOTE — ED Notes (Signed)
Per Dr. Pernell Dupre, all PO BP meds to be given now at same time.

## 2014-08-04 NOTE — Consult Note (Signed)
CARDIOLOGY CONSULT NOTE   Patient ID: [name] MRN: [id number] DOB/AGE: 1961-02-04 53 y.o.  Admit date: 08/04/2014  Primary Physician   Arnoldo Morale, MD Primary Cardiologist  Dr. Verl Blalock  Reason for Consultation   CHF  HPI: [name] is a 53 y.o. male with a history of CKD, chronic diastolic CHF, HTN, DM and medical non compliance who presented to the [facility] ED today with shortness of breath.   He has previously been seen by Dr. Verl Blalock in the community [facility] but was lost to follow up due to financial issues. He was also homeless for a period of time but now lives with his fiance's daughter. 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 He ran out of all of his medications last week except for Lasix 20mg  and glipizide. For the past week he has been noticing sharp chest pain and worsening SOB. His SOB of is worse with exertion and he cannot walk across the room without feeling completely out of breath. He also wakes up every two hours at night with a stabbing chest pain. His chest pain does not radiate and is associated with SOB and nausea. He has chronic LE swelling which is not worse right now according to the patient. He has a history of CKD with baseline Scr ~3. He was seen by nephrology during an admission for acute diastolic CHF in 03/7938 and a renal ultrasound was not consistent with medical renal disease or any acute pathology. He was supposed to follow up with nephrology but did not follow up with them.     Past Medical History  Diagnosis Date  . Diabetes mellitus     New onset  . Hypertension   . CHF (congestive heart failure)      Past Surgical History  Procedure Laterality Date  . Leg surgery    . Knee surgery    . Amputation  09/06/2011    Procedure: AMPUTATION RAY;  Surgeon: Wylene Simmer, MD;  Location: [place];  Service: Orthopedics;  Laterality: Left;  Left Hallux Amputation    No Known  Allergies  I have reviewed the patient's current medications       Prior to Admission medications   Medication Sig Start Date End Date Taking? Authorizing Provider  furosemide (LASIX) 20 MG tablet Take 20 mg by mouth 2 (two) times daily.   Yes Historical Provider, MD  glipiZIDE (GLUCOTROL) 10 MG tablet Take 1 tablet (10 mg total) by mouth 2 (two) times daily before a meal. 09/10/13  Yes Cherene Altes, MD  amLODipine (NORVASC) 10 MG tablet Take 1 tablet (10 mg total) by mouth daily. Patient not taking: Reported on 08/04/2014 09/10/13   Cherene Altes, MD  furosemide (LASIX) 80 MG tablet Take 1 tablet (80 mg total) by mouth 2 (two) times daily. Patient not taking: Reported on 08/04/2014 01/20/14   Renella Cunas, MD  hydrALAZINE (APRESOLINE) 10 MG tablet Take 1 tablet (10 mg total) by mouth 3 (three) times daily. Patient not taking: Reported on 08/04/2014 01/12/14   Linus Mako, PA-C  isosorbide dinitrate (ISORDIL) 30 MG tablet Take 1 tablet (30 mg total) by mouth 3 (three) times daily. Patient not taking: Reported on 08/04/2014 01/12/14   Linus Mako, PA-C  isosorbide mononitrate (IMDUR) 30 MG 24 hr tablet Take 30 mg by mouth daily.    Historical Provider, MD  lisinopril (  PRINIVIL,ZESTRIL) 5 MG tablet Take 5 mg by mouth daily.    Historical Provider, MD  metoprolol (LOPRESSOR) 100 MG tablet Take 1 tablet (100 mg total) by mouth daily. Patient not taking: Reported on 08/04/2014 01/12/14   Linus Mako, PA-C  metoprolol succinate (TOPROL-XL) 25 MG 24 hr tablet Take 25 mg by mouth daily.    Historical Provider, MD     History   Social History  . Marital Status: Legally Separated    Spouse Name: N/A    Number of Children: N/A  . Years of Education: N/A   Occupational History  . Not on file.   Social History Main Topics  . Smoking status: Never Smoker   . Smokeless tobacco: Not on file  . Alcohol Use: No  . Drug Use: No  . Sexual Activity: Not on file   Other Topics  Concern  . Not on file   Social History Narrative    No family status information on file.   Family History  Problem Relation Age of Onset  . Adopted: Yes     ROS:  Full 14 point review of systems complete and found to be negative unless listed above.  Physical Exam: Blood pressure 183/109, pulse 106, temperature 98.4 F (36.9 C), temperature source Oral, resp. rate 9, height 5\' 11"  (1.803 m), weight 199 lb (90.266 kg), SpO2 98 %.  General: Well developed, well nourished, male in no acute distress Head: Eyes PERRLA, No xanthomas.   Normocephalic and atraumatic, oropharynx without edema or exudate.  Lungs: decreased lung sounds at bases Heart: tachycardic. S1 S2, no rub/gallop, Heart irregular rate and rhythm with S1, S2  murmur. pulses are 2+ extrem.   Neck: No carotid bruits. No lymphadenopathy.+  JVD. Abdomen: Bowel sounds present, abdomen soft and non-tender without masses or hernias noted. Msk:  No spine or cva tenderness. No weakness, no joint deformities or effusions. Extremities: No clubbing or cyanosis. 2+ LE edema.  Neuro: Alert and oriented X 3. No focal deficits noted. Psych:  Good affect, responds appropriately Skin: No rashes or lesions noted.  Labs:   Lab Results  Component Value Date   WBC 11.0* 08/04/2014   HGB 9.5* 08/04/2014   HCT 29.4* 08/04/2014   MCV 91.9 08/04/2014   PLT 470* 08/04/2014      Recent Labs Lab 08/04/14 1007  NA 141  K 4.1  CL 106  CO2 21  BUN 42*  CREATININE 5.34*  CALCIUM 8.9  PROT 7.1  BILITOT 0.7  ALKPHOS 160*  ALT 22  AST 27  GLUCOSE 57*  ALBUMIN 2.1*   MAGNESIUM  Date Value Ref Range Status  09/08/2013 1.8 1.5 - 2.5 mg/dL Final    Recent Labs  08/04/14 1030  TROPIPOC 0.08   PRO B NATRIURETIC PEPTIDE (BNP)  Date/Time Value Ref Range Status  08/04/2014 10:07 AM 52226.0* 0 - 125 pg/mL Final  01/12/2014 02:10 PM 13600.0* 0 - 125 pg/mL Final   Lab Results  Component Value Date   CHOL 127 09/06/2013   HDL  38* 09/06/2013   LDLCALC 53 09/06/2013   TRIG 178* 09/06/2013   No results found for: DDIMER No results found for: LIPASE, AMYLASE TSH  Date/Time Value Ref Range Status  09/06/2013 09:40 AM 1.678 0.350 - 4.500 uIU/mL Final    Comment:    Performed at [facility]    Echo: Study Date: 09/07/2013 LV EF: 50% -55% Study Conclusions - Left ventricle: The cavity size was normal. There was  moderate concentric hypertrophy. Systolic function was low normal. The estimated ejection fraction was in the range of 50% to 55%. Regional wall motion abnormalities cannot be excluded. There is diastolic dysfunction with elevated LV filling pressure. - Aortic valve: Sclerosis without stenosis. No regurgitation. - Mitral valve: Mild regurgitation. - Left atrium: The atrium was mildly dilated (21 cm2). - Right ventricle: RV systolic pressure: 33ID Hg (S, est). Consistent with moderate pulmonary hypertension. - Right atrium: The atrium was normal in size. - Atrial septum: No defect or patent foramen ovale was identified. - Inferior vena cava: The vessel was normal in size; the respirophasic diameter changes were in the normal range (= 50%); findings are consistent with normal central venous pressure. - Pericardium, extracardiac: A trivial pericardial effusion was identified. Features were not consistent with tamponade physiology.  ECG:  Sinus tach HR 120  Radiology:  Dg Chest Port 1 View  08/04/2014   CLINICAL DATA:  Shortness of breath. Chest pain. Initial encounter.  EXAM: PORTABLE CHEST - 1 VIEW  COMPARISON:  01/12/2014 and 09/06/2013.  FINDINGS: 1013 hr. The heart size and mediastinal contours are stable. There are chronic low lung volumes with associated bibasilar pulmonary opacities. Previously demonstrated bilateral pleural effusions have nearly completely resolved. There is no new airspace disease or pneumothorax. The osseous structures appear unchanged.   IMPRESSION: Overall improved aeration of the lung bases and decreased pleural effusions compared with prior examination. No acute findings demonstrated.   Electronically Signed   By: Camie Patience M.D.   On: 08/04/2014 10:57    ASSESSMENT AND PLAN:    Active Problems:   Hypertension   Chronic diastolic HF (heart failure), NYHA class 4   Diabetes mellitus   CKD (chronic kidney disease), stage IV  Dorman Calderwood is a 53 y.o. male with a history of CKD, chronic diastolic CHF, HTN, DM and medical non compliance who presented to the [facility] ED today with shortness of breath.   Acute on chronic diastolic CHF- EF 56-86% 09/6835. ECHO with diastolic dysfunction, mild MR, and mod pulm HTN -- Troponin neg x1 -- BNP 55K but in the setting of AKI (creat 5). CXR non acute. -- Continue with IV Lasix carefully in the setting of acute kidney injury. Will continue with 80mg  IV Lasix q 8hrs   Acute on chronic kidney disease- creat up to 5.32 which is above his baseline around 3 -- May need to be seen by nephrology  Malignant HTN- BP up at 240/176. Now improved to 183/109 after IV Lasix 80mg , IV hydralazine 5mg  and NTG ointment in the ED. -- Due to running out of home anti-hypertensives x1 week -- Will try to optimize BP using hydralazine 50 mg q6, clonidine 0.1mg TID, metoprolol 100mg  BID with hold parameters if HR <50 -- Hold lisinopril with AKI   DM- SSI per IM.   SignedEileen Stanford, PA-C 08/04/2014 2:14 PM  Pager [phone]  Co-Sign MD

## 2014-08-04 NOTE — ED Notes (Signed)
[facility] transfer from [facility] c/o SOB, fatigue, worse with exertion, pitting edema, denies pain, A/O, hypertensive, sts he is out of his lasix and other meds

## 2014-08-04 NOTE — ED Notes (Signed)
Placed  On  Cardiac  Monitor      Nasal    o2   At  2  l  /  Min   Cardiac  Monitor           Saline  Lok   20  Angio   r  Arm

## 2014-08-04 NOTE — ED Notes (Signed)
Admitting at bedside 

## 2014-08-04 NOTE — ED Provider Notes (Signed)
CSN: [id number]     Arrival date & time 08/04/14  0175 History   First MD Initiated Contact with Patient 08/04/14 [phone]     Chief Complaint  Patient presents with  . Hypertension   (Consider location/radiation/quality/duration/timing/severity/associated sxs/prior Treatment) Patient is a 53 y.o. male presenting with hypertension.  Hypertension Associated symptoms include shortness of breath. Pertinent negatives include no chest pain.           53 year old male with chronic diastolic heart failure, chronic stage IV renal failure, hypertension presents for evaluation of high blood pressure, cough, and shortness of breath. He was seen by a nurse yesterday and his blood pressure was extremely high. He was told to follow-up here.  For a few days he has also had increasing shortness of breath and a nighttime cough that is productive of clear sputum. No fever, chills, NVD, chest pain. He has chronic leg swelling that is unchanged. He ran out of all of his medications, including his 80 mg twice a day Lasix, some time ago.  Past Medical History  Diagnosis Date  . Diabetes mellitus     New onset  . Hypertension   . CHF (congestive heart failure)    Past Surgical History  Procedure Laterality Date  . Leg surgery    . Knee surgery    . Amputation  09/06/2011    Procedure: AMPUTATION RAY;  Surgeon: Wylene Simmer, MD;  Location: [place];  Service: Orthopedics;  Laterality: Left;  Left Hallux Amputation   Family History  Problem Relation Age of Onset  . Adopted: Yes   History  Substance Use Topics  . Smoking status: Never Smoker   . Smokeless tobacco: Not on file  . Alcohol Use: No    Review of Systems  Constitutional: Positive for fatigue.  Respiratory: Positive for cough, chest tightness, shortness of breath and wheezing.   Cardiovascular: Positive for leg swelling. Negative for chest pain and palpitations.  Gastrointestinal: Negative for nausea and vomiting.  All other systems reviewed and are  negative.   Allergies  Review of patient's allergies indicates no known allergies.  Home Medications   Prior to Admission medications   Medication Sig Start Date End Date Taking? Authorizing Provider  amLODipine (NORVASC) 10 MG tablet Take 1 tablet (10 mg total) by mouth daily. 09/10/13   Cherene Altes, MD  furosemide (LASIX) 80 MG tablet Take 1 tablet (80 mg total) by mouth 2 (two) times daily. 01/20/14   Renella Cunas, MD  glipiZIDE (GLUCOTROL) 10 MG tablet Take 1 tablet (10 mg total) by mouth 2 (two) times daily before a meal. 09/10/13   Cherene Altes, MD  hydrALAZINE (APRESOLINE) 10 MG tablet Take 1 tablet (10 mg total) by mouth 3 (three) times daily. 01/12/14   Tiffany Marilu Favre, PA-C  isosorbide dinitrate (ISORDIL) 30 MG tablet Take 1 tablet (30 mg total) by mouth 3 (three) times daily. 01/12/14   Tiffany Marilu Favre, PA-C  metoprolol (LOPRESSOR) 100 MG tablet Take 1 tablet (100 mg total) by mouth daily. 01/12/14   Tiffany Marilu Favre, PA-C   BP 215/125 mmHg  Pulse 118  Temp(Src) 98.2 F (36.8 C) (Oral)  Resp 22  SpO2 92% Physical Exam  Constitutional: He is oriented to person, place, and time. He appears well-developed and well-nourished. No distress.  HENT:  Head: Normocephalic.  Cardiovascular: Regular rhythm and normal heart sounds.  Tachycardia present.   3+ pitting edema up to the knees bilaterally  Pulmonary/Chest: Tachypnea noted. He is  in respiratory distress. He has wheezes in the right middle field, the right lower field, the left middle field and the left lower field. He has rales in the right lower field and the left lower field.  Neurological: He is alert and oriented to person, place, and time. Coordination normal.  Skin: Skin is warm and dry. No rash noted. He is not diaphoretic.  Psychiatric: He has a normal mood and affect. Judgment normal.  Nursing note and vitals reviewed.   ED Course  ED EKG  Date/Time: 08/04/2014 8:59 AM Performed by: Allena Katz,  H Authorized by: Allena Katz, H Comparison: not compared with previous ECG  Rhythm: sinus tachycardia Rate: normal QRS axis: normal Conduction: conduction normal ST Segments: ST segments normal T Waves: T waves normal Other: no other findings Clinical impression: abnormal ECG   (including critical care time) Labs Review Labs Reviewed - No data to display  Imaging Review No results found.   MDM   1. Acute on chronic heart failure, unspecified heart failure type   2. SOB (shortness of breath)   3. Hypoxemia    Patient clinically with signs of heart failure, tachypnea, tachycardia, extremely hypertensive. He has bibasilar rales and wheezing with bipedal pitting edema. He is being transferred to the emergency department via EMS for probable admission. Started on cardiac monitoring, IV access obtained. EKG shows sinus tachycardia. Patient was also placed on 2 L of oxygen via nasal cannula.   Meds ordered this encounter  Medications  . 0.9 %  sodium chloride infusion    Sig:      Ronald Graham, PA-C 08/04/14 0900

## 2014-08-04 NOTE — ED Notes (Signed)
[facility]. [place], [place] [phone]

## 2014-08-05 ENCOUNTER — Encounter (HOSPITAL_COMMUNITY): Payer: Self-pay | Admitting: Cardiology

## 2014-08-05 DIAGNOSIS — N185 Chronic kidney disease, stage 5: Secondary | ICD-10-CM | POA: Diagnosis present

## 2014-08-05 DIAGNOSIS — N179 Acute kidney failure, unspecified: Secondary | ICD-10-CM | POA: Diagnosis present

## 2014-08-05 DIAGNOSIS — Z89022 Acquired absence of left finger(s): Secondary | ICD-10-CM | POA: Diagnosis not present

## 2014-08-05 DIAGNOSIS — Z608 Other problems related to social environment: Secondary | ICD-10-CM

## 2014-08-05 DIAGNOSIS — F1410 Cocaine abuse, uncomplicated: Secondary | ICD-10-CM | POA: Diagnosis present

## 2014-08-05 DIAGNOSIS — I739 Peripheral vascular disease, unspecified: Secondary | ICD-10-CM | POA: Diagnosis present

## 2014-08-05 DIAGNOSIS — K30 Functional dyspepsia: Secondary | ICD-10-CM | POA: Diagnosis present

## 2014-08-05 DIAGNOSIS — N2581 Secondary hyperparathyroidism of renal origin: Secondary | ICD-10-CM | POA: Diagnosis present

## 2014-08-05 DIAGNOSIS — Z9114 Patient's other noncompliance with medication regimen: Secondary | ICD-10-CM

## 2014-08-05 DIAGNOSIS — N189 Chronic kidney disease, unspecified: Secondary | ICD-10-CM | POA: Diagnosis present

## 2014-08-05 DIAGNOSIS — I120 Hypertensive chronic kidney disease with stage 5 chronic kidney disease or end stage renal disease: Secondary | ICD-10-CM | POA: Diagnosis present

## 2014-08-05 DIAGNOSIS — N289 Disorder of kidney and ureter, unspecified: Secondary | ICD-10-CM

## 2014-08-05 DIAGNOSIS — E119 Type 2 diabetes mellitus without complications: Secondary | ICD-10-CM | POA: Diagnosis present

## 2014-08-05 DIAGNOSIS — I5032 Chronic diastolic (congestive) heart failure: Secondary | ICD-10-CM

## 2014-08-05 DIAGNOSIS — Z91148 Patient's other noncompliance with medication regimen for other reason: Secondary | ICD-10-CM

## 2014-08-05 DIAGNOSIS — I5021 Acute systolic (congestive) heart failure: Secondary | ICD-10-CM | POA: Diagnosis present

## 2014-08-05 DIAGNOSIS — I130 Hypertensive heart and chronic kidney disease with heart failure and stage 1 through stage 4 chronic kidney disease, or unspecified chronic kidney disease: Secondary | ICD-10-CM

## 2014-08-05 DIAGNOSIS — Z87891 Personal history of nicotine dependence: Secondary | ICD-10-CM | POA: Diagnosis not present

## 2014-08-05 DIAGNOSIS — N184 Chronic kidney disease, stage 4 (severe): Secondary | ICD-10-CM

## 2014-08-05 DIAGNOSIS — I5033 Acute on chronic diastolic (congestive) heart failure: Secondary | ICD-10-CM | POA: Diagnosis present

## 2014-08-05 DIAGNOSIS — D631 Anemia in chronic kidney disease: Secondary | ICD-10-CM | POA: Diagnosis present

## 2014-08-05 LAB — COMPREHENSIVE METABOLIC PANEL
ALBUMIN: 1.9 g/dL — AB (ref 3.5–5.2)
ALT: 17 U/L (ref 0–53)
AST: 20 U/L (ref 0–37)
Alkaline Phosphatase: 139 U/L — ABNORMAL HIGH (ref 39–117)
Anion gap: 14 (ref 5–15)
BUN: 49 mg/dL — ABNORMAL HIGH (ref 6–23)
CALCIUM: 8.5 mg/dL (ref 8.4–10.5)
CO2: 22 mEq/L (ref 19–32)
Chloride: 106 mEq/L (ref 96–112)
Creatinine, Ser: 5.97 mg/dL — ABNORMAL HIGH (ref 0.50–1.35)
GFR calc Af Amer: 11 mL/min — ABNORMAL LOW (ref >90)
GFR calc non Af Amer: 10 mL/min — ABNORMAL LOW (ref >90)
Glucose, Bld: 88 mg/dL (ref 70–99)
Potassium: 4.3 mEq/L (ref 3.7–5.3)
SODIUM: 142 meq/L (ref 137–147)
TOTAL PROTEIN: 6.4 g/dL (ref 6.0–8.3)
Total Bilirubin: 0.5 mg/dL (ref 0.3–1.2)

## 2014-08-05 LAB — CBC
HCT: 27.7 % — ABNORMAL LOW (ref 39.0–52.0)
HEMOGLOBIN: 8.8 g/dL — AB (ref 13.0–17.0)
MCH: 29.2 pg (ref 26.0–34.0)
MCHC: 31.8 g/dL (ref 30.0–36.0)
MCV: 92 fL (ref 78.0–100.0)
Platelets: 492 10*3/uL — ABNORMAL HIGH (ref 150–400)
RBC: 3.01 MIL/uL — AB (ref 4.22–5.81)
RDW: 14.9 % (ref 11.5–15.5)
WBC: 10.1 10*3/uL (ref 4.0–10.5)

## 2014-08-05 LAB — HIV ANTIBODY (ROUTINE TESTING W REFLEX): HIV 1&2 Ab, 4th Generation: NONREACTIVE

## 2014-08-05 LAB — GLUCOSE, CAPILLARY
GLUCOSE-CAPILLARY: 90 mg/dL (ref 70–99)
GLUCOSE-CAPILLARY: 129 mg/dL — AB (ref 70–99)
Glucose-Capillary: 127 mg/dL — ABNORMAL HIGH (ref 70–99)

## 2014-08-05 LAB — TROPONIN I: Troponin I: <0.3 ng/mL (ref <0.30)

## 2014-08-05 LAB — PROTEIN / CREATININE RATIO, URINE
Creatinine, Urine: 43.47 mg/dL
PROTEIN CREATININE RATIO: 4.72 — AB (ref 0.00–0.15)
TOTAL PROTEIN, URINE: 205 mg/dL

## 2014-08-05 LAB — FOLATE: Folate: 19.8 ng/mL

## 2014-08-05 LAB — FERRITIN: FERRITIN: 227 ng/mL (ref 22–322)

## 2014-08-05 LAB — PHOSPHORUS: PHOSPHORUS: 5.5 mg/dL — AB (ref 2.3–4.6)

## 2014-08-05 LAB — OCCULT BLOOD X 1 CARD TO LAB, STOOL: Fecal Occult Bld: NEGATIVE

## 2014-08-05 LAB — VITAMIN B12: Vitamin B-12: 717 pg/mL (ref 211–911)

## 2014-08-05 MED ORDER — INFLUENZA VAC SPLIT QUAD 0.5 ML IM SUSY
0.5000 mL | PREFILLED_SYRINGE | INTRAMUSCULAR | Status: AC
  Administered 2014-08-06: 0.5 mL via INTRAMUSCULAR
  Filled 2014-08-05: qty 0.5

## 2014-08-05 MED ORDER — GUAIFENESIN 100 MG/5ML PO SYRP
200.0000 mg | ORAL_SOLUTION | ORAL | Status: DC | PRN
  Administered 2014-08-05 – 2014-08-06 (×2): 200 mg via ORAL
  Filled 2014-08-05 (×3): qty 10

## 2014-08-05 MED ORDER — CARVEDILOL 25 MG PO TABS
25.0000 mg | ORAL_TABLET | Freq: Two times a day (BID) | ORAL | Status: DC
  Administered 2014-08-05 – 2014-08-09 (×9): 25 mg via ORAL
  Filled 2014-08-05 (×11): qty 1

## 2014-08-05 MED ORDER — SODIUM CHLORIDE 0.9 % IV SOLN
1020.0000 mg | Freq: Once | INTRAVENOUS | Status: AC
  Administered 2014-08-05: 1020 mg via INTRAVENOUS
  Filled 2014-08-05 (×2): qty 34

## 2014-08-05 MED ORDER — DOCUSATE SODIUM 100 MG PO CAPS
100.0000 mg | ORAL_CAPSULE | Freq: Every day | ORAL | Status: DC | PRN
  Filled 2014-08-05 (×2): qty 1

## 2014-08-05 NOTE — Progress Notes (Signed)
Subjective:  SOB improving  Objective:  Vital Signs in the last 24 hours: Temp:  [97.6 F (36.4 C)-98.6 F (37 C)] 98.1 F (36.7 C) (12/03 0528) Pulse Rate:  [72-123] 74 (12/03 0528) Resp:  [9-31] 16 (12/03 0528) BP: (129-240)/(63-176) 134/87 mmHg (12/03 0528) SpO2:  [94 %-100 %] 98 % (12/03 0528) Weight:  [181 lb 1.6 oz (82.146 kg)-199 lb (90.266 kg)] 181 lb 1.6 oz (82.146 kg) (12/03 0528)  Intake/Output from previous day:  Intake/Output Summary (Last 24 hours) at 08/05/14 0927 Last data filed at 08/05/14 3474  Gross per 24 hour  Intake    460 ml  Output   5225 ml  Net  -4765 ml    Physical Exam: General appearance: alert, cooperative and no distress Lungs: few basilar rales Heart: regular rate and rhythm Extremities: trace edema   Rate: 78  Rhythm: normal sinus rhythm  Lab Results:  Recent Labs  08/04/14 1018 08/05/14 0423  WBC 11.0* 10.1  HGB 9.5* 8.8*  PLT 470* 492*    Recent Labs  08/04/14 1007 08/05/14 0423  NA 141 142  K 4.1 4.3  CL 106 106  CO2 21 22  GLUCOSE 57* 88  BUN 42* 49*  CREATININE 5.34* 5.97*    Recent Labs  08/04/14 2251 08/05/14 0423  TROPONINI <0.30 <0.30   No results for input(s): INR in the last 72 hours.  Imaging: Dg Chest Port 1 View  08/04/2014   CLINICAL DATA:  Shortness of breath. Chest pain. Initial encounter.  EXAM: PORTABLE CHEST - 1 VIEW  COMPARISON:  01/12/2014 and 09/06/2013.  FINDINGS: 1013 hr. The heart size and mediastinal contours are stable. There are chronic low lung volumes with associated bibasilar pulmonary opacities. Previously demonstrated bilateral pleural effusions have nearly completely resolved. There is no new airspace disease or pneumothorax. The osseous structures appear unchanged.  IMPRESSION: Overall improved aeration of the lung bases and decreased pleural effusions compared with prior examination. No acute findings demonstrated.   Electronically Signed   By: Camie Patience M.D.   On:  08/04/2014 10:57    Cardiac Studies: Echo Jan 2015 Study Conclusions  - Left ventricle: The cavity size was normal. There was moderate concentric hypertrophy. Systolic function was low normal. The estimated ejection fraction was in the range of 50% to 55%. Regional wall motion abnormalities cannot be excluded. There is diastolic dysfunction with elevated LV filling pressure. - Aortic valve: Sclerosis without stenosis. No regurgitation. - Mitral valve: Mild regurgitation. - Left atrium: The atrium was mildly dilated (21 cm2). - Right ventricle: RV systolic pressure: 25ZD Hg (S, est). Consistent with moderate pulmonary hypertension. - Right atrium: The atrium was normal in size. - Atrial septum: No defect or patent foramen ovale was identified. - Inferior vena cava: The vessel was normal in size; the respirophasic diameter changes were in the normal range (= 50%); findings are consistent with normal central venous pressure. - Pericardium, extracardiac: A trivial pericardial effusion was identified. Features were not consistent with tamponade physiology.  Assessment/Plan:  53 y/o male, previously been seen by Dr. Verl Blalock [facility] the [facility] but was lost to follow up due to financial issues. Admitted 08/03/14 with acute on chronic diastolic CHF, uncontrolled HTN, and acute on chronic renal insufficiency secondary to medical non compliance. He claimed financial issues but his drug screen was positive for cocaine. EF in Jan 55% by echo.    Principal Problem:   Acute on chronic congestive heart failure Active Problems:  Malignant hypertensive urgency   Cocaine abuse   Acute on chronic renal insufficiency   Chronic diastolic HF (heart failure), NYHA class 4   Diabetes mellitus   CKD (chronic kidney disease), stage IV   Non compliance w medication regimen   Anemia   PLAN: Continue diuresis, he may need renal consult if his SCr continues to  deteriorate. I discussed drug use with pt.   Kerin Ransom PA-C Beeper [phone] 08/05/2014, 9:27 AM  As above, patient seen and examined. He denies chest pain. His dyspnea is improving. He was admitted with severe hypertension and acute on chronic diastolic congestive heart failure. He had not been taking medications as he states he ran out. Drug screen positive for cocaine. Blood pressure is much better today. Continue present medications except discontinue clonidine (I would be hesitant to continue this medication long-term given the potential for rebound if he misses future doses). Continue Lasix at present dose and follow renal function. I would ask nephrology to see him. He is nearing dialysis. Await follow-up echocardiogram. Kirk Ruths

## 2014-08-05 NOTE — Consult Note (Signed)
Hospital Consult    Reason for Consult:  In need of permanent access  Referring Physician:  Mercy Moore MRN #:  [id number]  History of Present Illness: This is a 53 y.o. male who was admitted yesterday with extremely high BP, leg swelling, and SOB with activity  He takes several BP meds, but ran out of these last week and has not had these.  He also has a hx of chronic diastolic heart failure, CKD IV as well as diabetes, which he says is well controlled.  His left great toe was amputated at age 10 due to infection from metal in his toe.  Thus far, his troponins have been negative.  His BNP was greater than 50k.   He smoked when he was younger, but has since quit.  His drug screen was positive for cocaine.  He states that he got caught up with a friend and understands this was bad for him.  He states that he does want to change this as he has a 12 y/o daughter that he wants to be there for.  He is on multiple medications for his hypertesnion.  He is on oral agents for his DM.  Past Medical History  Diagnosis Date  . Diabetes mellitus     New onset  . Hypertension   . CHF (congestive heart failure)   . Cocaine abuse   . Diastolic congestive heart failure   . Chronic renal disease, stage IV   . PVD (peripheral vascular disease)   . Anemia     Past Surgical History  Procedure Laterality Date  . Leg surgery    . Knee surgery    . Amputation  09/06/2011    Procedure: AMPUTATION RAY;  Surgeon: Wylene Simmer, MD;  Location: [facility];  Service: Orthopedics;  Laterality: Left;  Left Hallux Amputation    No Known Allergies  Prior to Admission medications   Medication Sig Start Date End Date Taking? Authorizing Provider  furosemide (LASIX) 20 MG tablet Take 20 mg by mouth 2 (two) times daily.   Yes Historical Provider, MD  glipiZIDE (GLUCOTROL) 10 MG tablet Take 1 tablet (10 mg total) by mouth 2 (two) times daily before a meal. 09/10/13  Yes Cherene Altes, MD  amLODipine (NORVASC) 10 MG  tablet Take 1 tablet (10 mg total) by mouth daily. Patient not taking: Reported on 08/04/2014 09/10/13   Cherene Altes, MD  furosemide (LASIX) 80 MG tablet Take 1 tablet (80 mg total) by mouth 2 (two) times daily. Patient not taking: Reported on 08/04/2014 01/20/14   Renella Cunas, MD  hydrALAZINE (APRESOLINE) 10 MG tablet Take 1 tablet (10 mg total) by mouth 3 (three) times daily. Patient not taking: Reported on 08/04/2014 01/12/14   Linus Mako, PA-C  isosorbide dinitrate (ISORDIL) 30 MG tablet Take 1 tablet (30 mg total) by mouth 3 (three) times daily. Patient not taking: Reported on 08/04/2014 01/12/14   Linus Mako, PA-C  isosorbide mononitrate (IMDUR) 30 MG 24 hr tablet Take 30 mg by mouth daily.    Historical Provider, MD  lisinopril (PRINIVIL,ZESTRIL) 5 MG tablet Take 5 mg by mouth daily.    Historical Provider, MD  metoprolol (LOPRESSOR) 100 MG tablet Take 1 tablet (100 mg total) by mouth daily. Patient not taking: Reported on 08/04/2014 01/12/14   Linus Mako, PA-C  metoprolol succinate (TOPROL-XL) 25 MG 24 hr tablet Take 25 mg by mouth daily.    Historical Provider, MD    History  Social History  . Marital Status: Legally Separated    Spouse Name: N/A    Number of Children: N/A  . Years of Education: N/A   Occupational History  . Not on file.   Social History Main Topics  . Smoking status: Never Smoker   . Smokeless tobacco: Not on file  . Alcohol Use: No  . Drug Use: Yes    Special: Cocaine  . Sexual Activity: Not on file   Other Topics Concern  . Not on file   Social History Narrative     Family History  Problem Relation Age of Onset  . Adopted: Yes    ROS: [x]  Positive   [ ]  Negative   [ ]  All sytems reviewed and are negative  Cardiovascular: []  chest pain/pressure []  palpitations []  SOB  [x]  DOE []  pain in legs while walking []  pain in legs at rest []  pain in legs at night []  non-healing ulcers []  hx of DVT [x]  swelling in  legs  Pulmonary: []  productive cough []  asthma/wheezing []  home O2  Neurologic: []  weakness in []  arms []  legs []  numbness in []  arms []  legs []  hx of CVA []  mini stroke [x]  headache in setting of hypertension [x] seeing black spots [] difficulty speaking or slurred speech []  temporary loss of vision in one eye []  dizziness  Hematologic: []  hx of cancer []  bleeding problems []  problems with blood clotting easily  Endocrine:   [x]  diabetes []  thyroid disease  GI []  vomiting blood []  blood in stool  GU: [x]  CKD/renal failure []  HD--[]  M/W/F or []  T/T/S []  burning with urination []  blood in urine  Psychiatric: []  anxiety []  depression  Musculoskeletal: []  arthritis []  joint pain  Integumentary: []  rashes []  ulcers  Constitutional: []  fever []  chills   Physical Examination  Filed Vitals:   08/05/14 1042  BP: 135/68  Pulse: 73  Temp: 97.6 F (36.4 C)  Resp:    Body mass index is 25.27 kg/(m^2).  General:  WDWN in NAD Gait: Not observed HENT: WNL, normocephalic Pulmonary: normal non-labored breathing, without Rales, rhonchi,  wheezing Cardiac: regular, without  Murmurs, rubs or gallops; without carotid bruits Abdomen: soft, NT/ND, no masses Skin: without rashes, without ulcers  Vascular Exam/Pulses:  Right Left  Radial 2+ (normal) 2+ (normal)  Ulnar Unable to palpate  1+ (weak)  Femoral 2+ (normal) 2+ (normal)  Popliteal Unable to palpate  Unable to palpate   DP Unable to palpate  Unable to palpate   PT Unable to palpate  Unable to palpate    Extremities: without ischemic changes, without Gangrene , without cellulitis; without open wounds; left great toe well healed amputation; 2+ edema BLE Musculoskeletal: no muscle wasting or atrophy  Neurologic: A&O X 3; Appropriate Affect ; SENSATION: normal; MOTOR FUNCTION:  moving all extremities equally. Speech is fluent/normal Psychiatric:  Capable of medical decision making Lymph:  No inguinal  lymphadenopathy    CBC    Component Value Date/Time   WBC 10.1 08/05/2014 0423   RBC 3.01* 08/05/2014 0423   RBC 3.24* 08/04/2014 1642   HGB 8.8* 08/05/2014 0423   HCT 27.7* 08/05/2014 0423   PLT 492* 08/05/2014 0423   MCV 92.0 08/05/2014 0423   MCH 29.2 08/05/2014 0423   MCHC 31.8 08/05/2014 0423   RDW 14.9 08/05/2014 0423   LYMPHSABS 1.7 08/04/2014 1018   MONOABS 0.7 08/04/2014 1018   EOSABS 0.7 08/04/2014 1018   BASOSABS 0.1 08/04/2014 1018    BMET  Component Value Date/Time   NA 142 08/05/2014 0423   K 4.3 08/05/2014 0423   CL 106 08/05/2014 0423   CO2 22 08/05/2014 0423   GLUCOSE 88 08/05/2014 0423   BUN 49* 08/05/2014 0423   CREATININE 5.97* 08/05/2014 0423   CALCIUM 8.5 08/05/2014 0423   GFRNONAA 10* 08/05/2014 0423   GFRAA 11* 08/05/2014 0423    COAGS: No results found for: INR, PROTIME   Non-Invasive Vascular Imaging:   Vein mapping ordered & pending  Statin:  No. Beta Blocker:  Yes.   Aspirin:  No. ACEI:  Yes.   ARB:  No. Other antiplatelets/anticoagulants:  No.    ASSESSMENT/PLAN: This is a 53 y.o. male with CKD IV in need of permanent HD access.  -vein mapping has been ordered.  Will await results and then determine plan for access. -pt is right hand dominant.  He does have an IV in the left antecubital space.  Will have nurse remove this. -will restrict left upper extremity -discussed fistula vs graft placement with pt.  Will discuss further once vein mapping is completed.   -discussed with the pt the importance of stopping cocaine-he understands and states he wants to be healthier for his 76 year old daughter.    Leontine Locket, PA-C [facility] [phone]   Addendum  I have independently interviewed and examined the patient, and I agree with the physician assistant's findings.  Awaiting vein mapping to determine his permanent options.    Adele Barthel, MD [facility] Office:  [phone] Pager: [phone]  08/05/2014, 5:21 PM

## 2014-08-05 NOTE — Progress Notes (Signed)
Nutrition Education Note  RD consulted for Renal Diet Education (2gm Na, 2gm K, low PO4).  Patient reports receiving a renal diet education during previous admission; he states that since he has been following a low sodium diet and eating very little processed foods. He agreed to going over renal diet with this RD again today.  Provided Choose-A-Meal Booklet to patient as well as "Nutrition Therapy for Chronic Kidney Disease Stage 1-4" handout from the Academy of Nutrition and Dietetics. Reviewed food groups and provided written recommended serving sizes specifically determined for patient's current nutritional status.   Explained why diet restrictions are needed and provided lists of foods to limit/avoid that are high potassium, sodium, and phosphorus. Provided specific recommendations on safer alternatives of these foods. Strongly encouraged compliance of this diet.   Discussed importance of protein intake at each meal and snack. Encouraged patient to limit protein to 2-3 servings per meal until he starts dialysis.  Discussed need for fluid restriction with dialysis, importance of minimizing weight gain between HD treatments, and renal-friendly beverage options.  Teach back method used.  Expect good compliance.  Body mass index is 25.27 kg/(m^2). Pt meets criteria for Overweight based on current BMI. He states that his appetite is good. Patient reports that he used to weigh 200 lbs since he started eating less processed foods he has lost weight. Some days when he works he only eats 2 meals so, he may drink an Ensure shake.  RD suggested alternative renal friendly nutritional supplements.  Current diet order is Renal/Carb Modified, patient is consuming approximately 100% of meals at this time. Labs and medications reviewed. No further nutrition interventions warranted at this time. RD contact information provided. If additional nutrition issues arise, please re-consult RD.  Pryor Ochoa RD,  LDN Inpatient Clinical Dietitian Pager: [phone] After Hours Pager: [phone]

## 2014-08-05 NOTE — Progress Notes (Signed)
UR completed 

## 2014-08-05 NOTE — Progress Notes (Signed)
  Echocardiogram 2D Echocardiogram has been performed.  [name] 08/05/2014, 10:16 AM

## 2014-08-05 NOTE — Plan of Care (Signed)
Problem: Phase I Progression Outcomes Goal: Dyspnea controlled at rest (HF) Outcome: Completed/Met Date Met:  08/05/14 Goal: Pain controlled with appropriate interventions Outcome: Completed/Met Date Met:  08/05/14 Goal: Up in chair, BRP Outcome: Completed/Met Date Met:  08/05/14 Goal: Initial discharge plan identified Outcome: Completed/Met Date Met:  08/05/14 Goal: Voiding-avoid urinary catheter unless indicated Outcome: Completed/Met Date Met:  08/05/14 Goal: Hemodynamically stable Outcome: Completed/Met Date Met:  08/05/14

## 2014-08-05 NOTE — Consult Note (Signed)
[name] is an 53 y.o. male referred by Dr Lynnae January   Chief Complaint: Acute on CKD 4, anemia, sec HPTH HPI: 53yo Hispanic male admitted 08/04/14 for SOB and severe HTN.  I saw him last Jan in the hospital and he never followed up.  [facility] last showed unremarkable renal US, Sl high PTH and small M-spike. Pr/cr only 600mg /d. Scr at that time low 3's  He has hx of DM and HTN of uncertain duration with CKD thought to be secondary to those. He presents now with SBP 240 and + screen for cocaine.  Scr 5/15 was 3.5 and now 5.9.  He ran out of all his meds a week ago.  Past Medical History  Diagnosis Date  . Diabetes mellitus     New onset  . Hypertension   . CHF (congestive heart failure)   . Cocaine abuse   . Diastolic congestive heart failure   . Chronic renal disease, stage IV   . PVD (peripheral vascular disease)   . Anemia     Past Surgical History  Procedure Laterality Date  . Leg surgery    . Knee surgery    . Amputation  09/06/2011    Procedure: AMPUTATION RAY;  Surgeon: Wylene Simmer, MD;  Location: [facility];  Service: Orthopedics;  Laterality: Left;  Left Hallux Amputation    Family History  Problem Relation Age of Onset  . Adopted: Yes   Social History:  reports that he has never smoked. He does not have any smokeless tobacco history on file. He reports that he uses illicit drugs (Cocaine). He reports that he does not drink alcohol.  Allergies: No Known Allergies  Medications Prior to Admission  Medication Sig Dispense Refill  . furosemide (LASIX) 20 MG tablet Take 20 mg by mouth 2 (two) times daily.    Marland Kitchen glipiZIDE (GLUCOTROL) 10 MG tablet Take 1 tablet (10 mg total) by mouth 2 (two) times daily before a meal. 60 tablet 0  . amLODipine (NORVASC) 10 MG tablet Take 1 tablet (10 mg total) by mouth daily. (Patient not taking: Reported on 08/04/2014) 30 tablet 0  . furosemide (LASIX) 80 MG tablet Take 1 tablet (80 mg total) by mouth 2 (two) times daily. (Patient not taking:  Reported on 08/04/2014) 30 tablet 1  . hydrALAZINE (APRESOLINE) 10 MG tablet Take 1 tablet (10 mg total) by mouth 3 (three) times daily. (Patient not taking: Reported on 08/04/2014) 90 tablet 0  . isosorbide dinitrate (ISORDIL) 30 MG tablet Take 1 tablet (30 mg total) by mouth 3 (three) times daily. (Patient not taking: Reported on 08/04/2014) 90 tablet 0  . isosorbide mononitrate (IMDUR) 30 MG 24 hr tablet Take 30 mg by mouth daily.    Marland Kitchen lisinopril (PRINIVIL,ZESTRIL) 5 MG tablet Take 5 mg by mouth daily.    . metoprolol (LOPRESSOR) 100 MG tablet Take 1 tablet (100 mg total) by mouth daily. (Patient not taking: Reported on 08/04/2014) 30 tablet 0  . metoprolol succinate (TOPROL-XL) 25 MG 24 hr tablet Take 25 mg by mouth daily.       Lab Results: UA: >300 prot 3-6rbc   Recent Labs  08/04/14 1018 08/05/14 0423  WBC 11.0* 10.1  HGB 9.5* 8.8*  HCT 29.4* 27.7*  PLT 470* 492*   BMET  Recent Labs  08/04/14 1007 08/05/14 0423  NA 141 142  K 4.1 4.3  CL 106 106  CO2 21 22  GLUCOSE 57* 88  BUN 42* 49*  CREATININE 5.34* 5.97*  CALCIUM 8.9 8.5   LFT  Recent Labs  08/04/14 1007 08/05/14 0423  PROT 7.1 6.4  ALBUMIN 2.1* 1.9*  AST 27 20  ALT 22 17  ALKPHOS 160* 139*  BILITOT 0.7 0.5  BILIDIR <0.2  --   IBILI NOT CALCULATED  --    Dg Chest Port 1 View  08/04/2014   CLINICAL DATA:  Shortness of breath. Chest pain. Initial encounter.  EXAM: PORTABLE CHEST - 1 VIEW  COMPARISON:  01/12/2014 and 09/06/2013.  FINDINGS: 1013 hr. The heart size and mediastinal contours are stable. There are chronic low lung volumes with associated bibasilar pulmonary opacities. Previously demonstrated bilateral pleural effusions have nearly completely resolved. There is no new airspace disease or pneumothorax. The osseous structures appear unchanged.  IMPRESSION: Overall improved aeration of the lung bases and decreased pleural effusions compared with prior examination. No acute findings demonstrated.    Electronically Signed   By: Camie Patience M.D.   On: 08/04/2014 10:57    ROS: No change vision Breathing better now No CP + constipation No new arthritic CO No new Neuropathic Sx No dysuria but some hesitancy  PHYSICAL EXAM: Blood pressure 135/68, pulse 73, temperature 97.6 F (36.4 C), temperature source Oral, resp. rate 16, height 5\' 11"  (1.803 m), weight 82.146 kg (181 lb 1.6 oz), SpO2 97 %. HEENT: PERRLA EOMI NECK:No JVD LUNGS:Faint basilar crackles CARDIAC:RRR +S4 gallop  No M ABD:+ BS NTND No HSM EXT:1+ edema NEURO:CNI M&SI Ox3 no asterixis  Assessment: 1. Acute on CKD 4 vs progression of CKD 4.  I think HTN is larger part of his CKD than DM.  Not sure of sig of small M spike from Jan but will recheck and check free light chains IFE 2. HTN poorly controlled 3. DM 4. Anemia 5. Sec HPTH 6.  Cocaine use PLAN: 1. Check SPEP, IFE, free light chains 2. Check PTH, PO4 3. IV iron 4. Show Dx videos 5.  Vein mapping 6.  [facility] to see. I called them 7. I had a long discussion with him regarding the inevitability of HD and the need to place an access now.  Time will tell if will need HD this admission. 8.   May need ESA but will give iron first 9. Dietitian to see   [name] 08/05/2014, 11:14 AM

## 2014-08-05 NOTE — Care Management Note (Addendum)
    Page 1 of 2   08/09/2014     4:06:51 PM CARE MANAGEMENT NOTE 65/02/[id number]  Patient:  [name]   Account Number:  [id number]  Date Initiated:  08/05/2014  Documentation initiated by:  Mariann Laster  Subjective/Objective Assessment:   CHF     Action/Plan:   CM to follow for dispositiion needs   Anticipated DC Date:  08/08/2014   Anticipated DC Plan:  HOME/SELF CARE  In-house referral  Clinical Social Worker  Development worker, community      DC Planning Services  CM consult  Medication Assistance      [facility] Choice  NA   Choice offered to / List presented to:  NA           Status of service:  Completed, signed off Medicare Important Message given?  NO (If response is "NO", the following Medicare IM given date fields will be blank) Date Medicare IM given:   Medicare IM given by:   Date Additional Medicare IM given:   Additional Medicare IM given by:    Discharge Disposition:  HOME/SELF CARE  Per UR Regulation:  Reviewed for med. necessity/level of care/duration of stay  If discussed at [place] of Stay Meetings, dates discussed:   08/10/2014    Comments:  Mariann Laster RN, BSN, [place], CCM  Nurse - Case Manager,  (Unit [phone]  08/09/2014 Patient last used [facility] 09/2013. CM contacted [facility] [phone] to f/u on [place]/Medication Card.  Pharmacy states last used May 2015 but remains active.  Pharmacy closes at [place].  Dr. Sherrine Maples notified of update. Dispo Plan:  OP [place] placement in one week (d/t hospital scheduling issues and no appt time open for one week). MCD APPLICATION:  SW has outreached to F.A. Patient understands importance of MCD application d/t plans for HD.    Devera Englander RN, BSN, MSHL, CCM  Nurse - Case Manager,  (Unit [phone]  08/05/2014 Social:  From home with fiancee and 2 young DTRs.   States fiancee is covering all the bills right now d/t his inability to work.  Applied for MCD but denied.   Applied for SDD but denied but has appealed with attorney on case. Currently Self pay.  Moved to [place] from [place]. Meds: states has been getting medications from [place] on occasion PCP:  [facility] - Dr. Verl Blalock CM reviewed [facility] f/u care for PCP mgmt and application for medication assistance program at [facility]. Hard copies provided to patient with instructions on application process. CM provided $4.00 med list to patient. Dispo Plan:  Home / Self Care. CM will continue to monitor for d/c needs

## 2014-08-05 NOTE — Progress Notes (Signed)
Subjective: [name] feels well this morning. His shortness of breath has improved somewhat, his headache has passed, and he is not in chest pain. He is continuing to see black spots out of both of his eyes that drift downward.  Objective: Vital signs in last 24 hours: Filed Vitals:   08/04/14 1643 08/04/14 2039 08/05/14 0129 08/05/14 0528  BP: 183/128 156/63 129/69 134/87  Pulse: 103 83 72 74  Temp: 98.1 F (36.7 C) 97.6 F (36.4 C) 97.7 F (36.5 C) 98.1 F (36.7 C)  TempSrc: Oral Oral Oral Oral  Resp: 20 18 16 16   Height: 5\' 11"  (1.803 m)     Weight: 185 lb 4.8 oz (84.052 kg)   181 lb 1.6 oz (82.146 kg)  SpO2: 95% 96% 100% 98%   Weight change:   Intake/Output Summary (Last 24 hours) at 08/05/14 0950 Last data filed at 08/05/14 2633  Gross per 24 hour  Intake    460 ml  Output   5225 ml  Net  -4765 ml   Physical Exam: Appearance: in NAD, non-sweaty, lying in bed at 40 degrees, did not become short of breath on lying down for exam HEENT: no periorbital edema, [place]/AT, PERRL, EOMi Heart: tachycardic, normal S1S2, JVD present Lungs: CTAB with decreased lung sounds at bases Abdomen: BS+, soft, nontender Musculoskeletal: several areas of hyperpigmented skin on back Extremities: 2+ pitting edema BLE, left great toe amputated, small open wound left knee Neurologic: A&Ox3, appropriately interactive, grossly intact Skin: no rashes or lesions  Lab Results: Basic Metabolic Panel:  Recent Labs Lab 08/04/14 1007 08/05/14 0423  NA 141 142  K 4.1 4.3  CL 106 106  CO2 21 22  GLUCOSE 57* 88  BUN 42* 49*  CREATININE 5.34* 5.97*  CALCIUM 8.9 8.5   Liver Function Tests:  Recent Labs Lab 08/04/14 1007 08/05/14 0423  AST 27 20  ALT 22 17  ALKPHOS 160* 139*  BILITOT 0.7 0.5  PROT 7.1 6.4  ALBUMIN 2.1* 1.9*   CBC:  Recent Labs Lab 08/04/14 1018 08/05/14 0423  WBC 11.0* 10.1  NEUTROABS 7.8*  --   HGB 9.5* 8.8*  HCT 29.4* 27.7*  MCV 91.9 92.0  PLT  470* 492*   Cardiac Enzymes:  Recent Labs Lab 08/04/14 1642 08/04/14 2251 08/05/14 0423  TROPONINI <0.30 <0.30 <0.30   BNP:  Recent Labs Lab 08/04/14 1007  PROBNP 52226.0*   CBG:  Recent Labs Lab 08/04/14 1628 08/04/14 2050 08/05/14 0602  GLUCAP 109* 144* 90   Hemoglobin A1C:  Recent Labs Lab 08/04/14 1642  HGBA1C 5.5   Fasting Lipid Panel:  Recent Labs Lab 08/04/14 2300  CHOL 139  HDL 56  LDLCALC 67  TRIG 82  CHOLHDL 2.5   Thyroid Function Tests:  Recent Labs Lab 08/04/14 1717  TSH 1.820   Anemia Panel:  Recent Labs Lab 08/04/14 1642  VITAMINB12 717  FOLATE 19.8  FERRITIN 227  TIBC 239  IRON 34*  RETICCTPCT 1.7   HIV nonreactive 08/04/14  Urine Drug Screen: Drugs of Abuse     Component Value Date/Time   LABOPIA NONE DETECTED 08/04/2014 2032   COCAINSCRNUR POSITIVE* 08/04/2014 2032   LABBENZ NONE DETECTED 08/04/2014 2032   AMPHETMU NONE DETECTED 08/04/2014 2032   THCU NONE DETECTED 08/04/2014 2032   LABBARB NONE DETECTED 08/04/2014 2032    Urinalysis:  Recent Labs Lab 08/04/14 1709  COLORURINE YELLOW  LABSPEC 1.010  PHURINE 7.0  GLUCOSEU 100*  HGBUR SMALL*  BILIRUBINUR NEGATIVE  KETONESUR NEGATIVE  PROTEINUR >300*  UROBILINOGEN 0.2  NITRITE NEGATIVE  LEUKOCYTESUR NEGATIVE   Studies/Results: Dg Chest Port 1 View  08/04/2014   CLINICAL DATA:  Shortness of breath. Chest pain. Initial encounter.  EXAM: PORTABLE CHEST - 1 VIEW  COMPARISON:  01/12/2014 and 09/06/2013.  FINDINGS: 1013 hr. The heart size and mediastinal contours are stable. There are chronic low lung volumes with associated bibasilar pulmonary opacities. Previously demonstrated bilateral pleural effusions have nearly completely resolved. There is no new airspace disease or pneumothorax. The osseous structures appear unchanged.  IMPRESSION: Overall improved aeration of the lung bases and decreased pleural effusions compared with prior examination. No acute findings  demonstrated.   Electronically Signed   By: Camie Patience M.D.   On: 08/04/2014 10:57   Medications: I have reviewed the patient's current medications. Scheduled Meds: . amLODipine  10 mg Oral Daily  . cloNIDine  0.1 mg Oral TID  . furosemide  80 mg Intravenous TID  . heparin  5,000 Units Subcutaneous 3 times per day  . hydrALAZINE  50 mg Oral 3 times per day  . isosorbide mononitrate  30 mg Oral Daily  . metoprolol tartrate  100 mg Oral BID   Continuous Infusions:  PRN Meds:.acetaminophen **OR** acetaminophen Assessment/Plan: Principal Problem:   Acute on chronic congestive heart failure Active Problems:   Anemia   Chronic diastolic HF (heart failure), NYHA class 4   Diabetes mellitus   CKD (chronic kidney disease), stage IV   Malignant hypertensive urgency   Non compliance w medication regimen   Cocaine abuse   Acute on chronic renal insufficiency  [name] is a 53 yo man with chronic diastolic heart failure, chronic stage IV renal failure and hypertension who was admitted for evaluation of acutely high blood pressure, leg swelling and shortness of breath in the setting of missing his medications for a week. He had malignant hypertension on admission; he was also found to be cocaine +.  Malignant Hypertension: Currently 130s/60s. Episode was likely secondary to medication withdrawal and/or cocaine use. BP to 240/176 in the ED. ProBNP 52226. I-stat troponin 0.08. Home medications: amlodipine 10 mg daily, hydralazine 10 mg TID, lisinopril 5 mg, metoprolol XL 25 mg.  - Metoprolol tartrate 50-100 mg BID - Hydralazine 25-50 mg 4x per day - Amlodipine 10 mg daily - IV lasix 80 mg TID - Stop clonidine as patient's questionable compliance puts him at risk of rebound in the future - Appreciate cardiology following - Monitor vitals closely  - Frequent neuro checks (q4hours) - SPEP, UPEP, PTH, Renal US, TSH  Acute on Chronic NYHA Class 4 Diastolic Heart Failure: BNP  elevated to 52226 (in the setting of CKD and malignant htn). Significant LE edema. That said, weight down 19 lbs from last admission (may be somewhat intentional) and CXR showed improved aeration of the lung bases and decreased pleural effusions compared to last. Evaluated in 2014 with normal systolic function and LVEF >55%. Home lasix 80 mg BID. Lipid panel WNL except LDL 67. - IV lasix 80 mg q8hours - 2D echo pending - Tele - Troponins negative x3 - Better plan for outpatient medication ascertainment  Acute on chronic CKD (stage IV): There is evidence of kidney injury progression; likely at least partly secondary to malignant hypertension. Baseline SCr~3.57, now 5.34-->5.97.  - Trend creatinine - Appreciate nephrology consult; does appear that Dr. Mercy Moore has seen the patient in hospital in the past  Chest Pain: Has a history of angina; on home imdur  and isordil. Troponins negative x3. - Continue home imdur and isordil   Normocytic Anemia: 9.5. Basline ~9.7. No active bleeding - FOBT pending - Will need colonoscopy as outpatient  DMII: Last A1c 7.0% in 09/2013 - ISS - Check HgbA1c  Patient Running out of Medications: - Consult to Care Management; patient receives medications from [facility](?); many financial hardships; believes he may lose his job  Diet:  - Renal/carb mod  DVT Ppx: - [place] heparin  Dispo: Disposition is deferred at this time, awaiting improvement of current medical problems.  Anticipated discharge in approximately 1-2 day(s).   The patient does have a current PCP (Arnoldo Morale, MD) and does not need an [facility] hospital follow-up appointment after discharge.  The patient does have transportation limitations that hinder transportation to clinic appointments.  .Services Needed at time of discharge: Y = Yes, Blank = No PT:   OT:   RN:   Equipment:   Other:     LOS: 1 day   Drucilla Schmidt, MD 08/05/2014, 9:50 AM

## 2014-08-06 DIAGNOSIS — N189 Chronic kidney disease, unspecified: Secondary | ICD-10-CM

## 2014-08-06 LAB — BASIC METABOLIC PANEL
Anion gap: 15 (ref 5–15)
BUN: 56 mg/dL — AB (ref 6–23)
CO2: 23 mEq/L (ref 19–32)
CREATININE: 6.28 mg/dL — AB (ref 0.50–1.35)
Calcium: 8.6 mg/dL (ref 8.4–10.5)
Chloride: 102 mEq/L (ref 96–112)
GFR, EST AFRICAN AMERICAN: 11 mL/min — AB (ref >90)
GFR, EST NON AFRICAN AMERICAN: 9 mL/min — AB (ref >90)
Glucose, Bld: 97 mg/dL (ref 70–99)
Potassium: 4 mEq/L (ref 3.7–5.3)
Sodium: 140 mEq/L (ref 137–147)

## 2014-08-06 LAB — GLUCOSE, CAPILLARY
GLUCOSE-CAPILLARY: 104 mg/dL — AB (ref 70–99)
GLUCOSE-CAPILLARY: 160 mg/dL — AB (ref 70–99)
Glucose-Capillary: 176 mg/dL — ABNORMAL HIGH (ref 70–99)
Glucose-Capillary: 168 mg/dL — ABNORMAL HIGH (ref 70–99)
Glucose-Capillary: 173 mg/dL — ABNORMAL HIGH (ref 70–99)

## 2014-08-06 LAB — KAPPA/LAMBDA LIGHT CHAINS
KAPPA FREE LGHT CHN: 60.5 mg/dL — AB (ref 0.33–1.94)
KAPPA, LAMDA LIGHT CHAIN RATIO: 9.79 — AB (ref 0.26–1.65)
LAMDA FREE LIGHT CHAINS: 6.18 mg/dL — AB (ref 0.57–2.63)

## 2014-08-06 LAB — TSH: TSH: 2.06 u[IU]/mL (ref 0.350–4.500)

## 2014-08-06 LAB — PHOSPHORUS: Phosphorus: 6.5 mg/dL — ABNORMAL HIGH (ref 2.3–4.6)

## 2014-08-06 LAB — PARATHYROID HORMONE, INTACT (NO CA): PTH: 177 pg/mL — ABNORMAL HIGH (ref 14–64)

## 2014-08-06 MED ORDER — FAMOTIDINE 20 MG PO TABS
20.0000 mg | ORAL_TABLET | Freq: Every day | ORAL | Status: DC
  Administered 2014-08-06 – 2014-08-09 (×4): 20 mg via ORAL
  Filled 2014-08-06 (×4): qty 1

## 2014-08-06 MED ORDER — HYDRALAZINE HCL 25 MG PO TABS
25.0000 mg | ORAL_TABLET | Freq: Three times a day (TID) | ORAL | Status: DC
  Administered 2014-08-06 – 2014-08-07 (×3): 25 mg via ORAL
  Filled 2014-08-06 (×6): qty 1

## 2014-08-06 MED ORDER — HYDRALAZINE HCL 50 MG PO TABS
50.0000 mg | ORAL_TABLET | Freq: Two times a day (BID) | ORAL | Status: DC
  Filled 2014-08-06: qty 1

## 2014-08-06 NOTE — Progress Notes (Addendum)
Subjective: [name] feels well this morning. His only complaint is a progressing headache. His shortness of breath has improved and he has not been in chest pain. He feels that he has a good understanding of what HD will be like, having had a nice talk with Dr. Mercy Mckenzie and having had a family member in [place] who was on HD.  Objective: Vital signs in last 24 hours: Filed Vitals:   08/06/14 0035 08/06/14 0500 08/06/14 1003 08/06/14 1255  BP: 124/70 110/59 108/61 143/87  Pulse: 76 72 79 79  Temp: 97.6 F (36.4 C) 97.6 F (36.4 C)    TempSrc: Oral Oral    Resp: 16 16    Height:      Weight:  180 lb 3.2 oz (81.738 kg)    SpO2: 97% 98%     Weight change: -18 lb 12.8 oz (-8.528 kg)  Intake/Output Summary (Last 24 hours) at 08/06/14 1356 Last data filed at 08/06/14 1059  Gross per 24 hour  Intake   1264 ml  Output   3025 ml  Net  -1761 ml   Physical Exam: Appearance: in NAD lying in bed at 40 degrees, appears comfortable HEENT: no periorbital edema, [place]/AT, PERRL, EOMi Heart: RRR, normal S1S2, JVD present Lungs: CTAB  Abdomen: BS+, soft, nontender Musculoskeletal: several areas of hyperpigmented skin on back Extremities: 1+ pitting edema BLE, left great toe amputated, small open wound left knee Neurologic: A&Ox3, appropriately interactive, grossly intact Skin: no rashes or lesions  Lab Results: Basic Metabolic Panel:  Recent Labs Lab 08/05/14 0423 08/05/14 1420 08/06/14 0345  NA 142  --  140  K 4.3  --  4.0  CL 106  --  102  CO2 22  --  23  GLUCOSE 88  --  97  BUN 49*  --  56*  CREATININE 5.97*  --  6.28*  CALCIUM 8.5  --  8.6  PHOS  --  5.5* 6.5*   Liver Function Tests:  Recent Labs Lab 08/04/14 1007 08/05/14 0423  AST 27 20  ALT 22 17  ALKPHOS 160* 139*  BILITOT 0.7 0.5  PROT 7.1 6.4  ALBUMIN 2.1* 1.9*   CBC:  Recent Labs Lab 08/04/14 1018 08/05/14 0423  WBC 11.0* 10.1  NEUTROABS 7.8*  --   HGB 9.5* 8.8*  HCT 29.4* 27.7*    MCV 91.9 92.0  PLT 470* 492*   Cardiac Enzymes:  Recent Labs Lab 08/04/14 1642 08/04/14 2251 08/05/14 0423  TROPONINI <0.30 <0.30 <0.30   BNP:  Recent Labs Lab 08/04/14 1007  PROBNP 52226.0*   CBG:  Recent Labs Lab 08/05/14 0602 08/05/14 1210 08/05/14 1719 08/05/14 2113 08/06/14 0625 08/06/14 1104  GLUCAP 90 129* 127* 176* 104* 168*   Hemoglobin A1C:  Recent Labs Lab 08/04/14 1642  HGBA1C 5.5   Fasting Lipid Panel:  Recent Labs Lab 08/04/14 2300  CHOL 139  HDL 56  LDLCALC 67  TRIG 82  CHOLHDL 2.5   Thyroid Function Tests:  Recent Labs Lab 08/06/14 0345  TSH 2.060   Anemia Panel:  Recent Labs Lab 08/04/14 1642  VITAMINB12 717  FOLATE 19.8  FERRITIN 227  TIBC 239  IRON 34*  RETICCTPCT 1.7   HIV nonreactive 08/04/14  Urine Drug Screen: Drugs of Abuse     Component Value Date/Time   LABOPIA NONE DETECTED 08/04/2014 2032   COCAINSCRNUR POSITIVE* 08/04/2014 2032   LABBENZ NONE DETECTED 08/04/2014 2032   AMPHETMU NONE DETECTED 08/04/2014 2032  THCU NONE DETECTED 08/04/2014 2032   LABBARB NONE DETECTED 08/04/2014 2032    Urinalysis:  Recent Labs Lab 08/04/14 1709  COLORURINE YELLOW  LABSPEC 1.010  PHURINE 7.0  GLUCOSEU 100*  HGBUR SMALL*  BILIRUBINUR NEGATIVE  KETONESUR NEGATIVE  PROTEINUR >300*  UROBILINOGEN 0.2  NITRITE NEGATIVE  LEUKOCYTESUR NEGATIVE   Studies/Results: No results found. Medications: I have reviewed the patient's current medications. Scheduled Meds: . carvedilol  25 mg Oral BID WC  . famotidine  20 mg Oral Daily  . heparin  5,000 Units Subcutaneous 3 times per day  . hydrALAZINE  25 mg Oral 3 times per day  . isosorbide mononitrate  30 mg Oral Daily   2D echo 08/05/14: Study Conclusions - Left ventricle: The cavity size was normal. There was mild concentric hypertrophy. Systolic function was normal. The estimated ejection fraction was in the range of 50% to 55%. D-shpaed septum  consistent with elevated right ventricular pressure/volume overload. There is hypokinesis of the basal-midinferoseptal myocardium. Features are consistent with a pseudonormal left ventricular filling pattern, with concomitant abnormal relaxation and increased filling pressure (grade 2 diastolic dysfunction). - Mitral valve: There was mild regurgitation. - Left atrium: The atrium was moderately dilated. - Right atrium: The atrium was mildly dilated. - Pulmonary arteries: Systolic pressure was moderately increased. PA peak pressure: 48 mm Hg (S). - Pericardium, extracardiac: A small pericardial effusion was identified along the right ventricular free wall. There was no evidence of hemodynamic compromise. Impressions: - Compared to the prior study, there has been no significant interval change.  Continuous Infusions:  PRN Meds:.acetaminophen **OR** acetaminophen, docusate sodium, guaifenesin Assessment/Plan: Principal Problem:   Acute on chronic congestive heart failure Active Problems:   Anemia   Chronic diastolic HF (heart failure), NYHA class 4   Diabetes mellitus   CKD (chronic kidney disease), stage IV   Malignant hypertensive urgency   Non compliance w medication regimen   Cocaine abuse   Acute on chronic renal insufficiency  [name] is a 53 yo man with chronic diastolic heart failure, chronic stage IV renal failure and hypertension who was admitted for evaluation of acutely high blood pressure, leg swelling and shortness of breath in the setting of missing his medications for a week. He had malignant hypertension on admission; he was also found to be cocaine +.  Malignant Hypertension: Now resolved; currently 140s/80s. Episode was likely secondary to medication withdrawal and/or cocaine use. BP to 240/176 in the ED. ProBNP 52226. I-stat troponin 0.08. Home medications: amlodipine 10 mg daily, hydralazine 10 mg TID, lisinopril 5 mg, metoprolol XL  25 mg.  - Renal US pending - Stop metoprolol (switch to carvedilol) in context of cocaine use - Hold BP meds for SBP <115 as BP has been too well controlled - Continue carvedilol 25 mg BID - Continue imdur 30 mg daily - Decrease hydralazine to 25 mg 3x per day - Discontinue Amlodipine 10 mg daily - Hold IV lasix 80 mg TID in context of rising creatinine - Stop clonidine as patient's questionable compliance puts him at risk of rebound in the future - Monitor vitals closely  - Frequent neuro checks (q4hours) - Appreciate cardiology and nephrology following  Acute on Chronic NYHA Class 4 Diastolic Heart Failure: Volume status is improving. BNP initially elevated to 52226 (in the setting of CKD and malignant htn). Significant LE edema. That said, weight down 19 lbs from last admission (may be somewhat intentional) and CXR showed improved aeration of the lung bases and decreased  pleural effusions compared to last. Echo had no significant change from LVEF 50-55% and grade 2 diastolic dysfunction (unchanged from 2014). Home lasix 80 mg BID. Lipid panel WNL except LDL 67. - Hold IV lasix 80 mg q8hours in context of rising creatinine - Tele - Troponins negative x3 - Better plan for outpatient medication ascertainment  Acute on chronic CKD (stage IV): There is evidence of kidney injury progression; likely at least partly secondary to malignant hypertension. Baseline SCr~3.57, now 5.34-->5.97-->6.28. Patient has normocytic anemia (hemoglobin 8.8). SPEP, IFE, free light chains (total protein ELP 6.0, IgG 697, IgA 1270(H), IgM serum 41(L), kappa free light chain 60.50(H) lamda free light chain 6.18(H), kappa, lamda light chain 9.79(H). PTH 177(H), phosphorus 6.5 (H), TSH 2.060. Patient will likely require dialysis. Vein mapping was completed today, left arm.  - Appreciate nephrology consult - Renal function panel tomo am   Normocytic Anemia: Likely secondary to chronic renal disease. 9.5-->8.8. Basline  ~9.7. No active bleeding. Iron studies: Iron 34, UIBC 205, TIBC 239, Ferritin 227, Folate 19.8. - Received IV iron yesterday  M Spike of ? Significance: M spike noted in1/2015 - Rechecking free light chains IFE  Chest Pain: Resolved. Has a history of angina; on home imdur and isordil. Troponins negative x3. Described some new burning with a sour taste in his mouth over the past 2 days. - Continue home imdur and isordil  - Famotidine 20 mg PO BID  DMII: Last A1c 7.0% in 09/2013 - ISS - Check HgbA1c  Patient Running out of Medications: - Consult to Care Management; patient receives medications from [facility](?); many financial hardships; believes he may lose his job  Cocaine Abuse: - Patient ready to quit using cocaine; states that he is "not addicted" and "messed up" - Cessation counseling  Diet:  - Renal/carb mod  DVT Ppx: -  heparin  Dispo: Disposition is deferred at this time, awaiting improvement of current medical problems.  Anticipated discharge in approximately 3-4 day(s).   The patient does have a current PCP (Arnoldo Morale, MD) and does not need an [facility] hospital follow-up appointment after discharge.  The patient does have transportation limitations that hinder transportation to clinic appointments.  .Services Needed at time of discharge: Y = Yes, Blank = No PT:   OT:   RN:   Equipment:   Other:     LOS: 2 days   Drucilla Schmidt, MD 08/06/2014, 1:56 PM     Date: 09/14/2014  Patient name: [name]  Medical record number: [id number]  Date of birth: 18-May-1961   This patient has been seen and the plan of care was discussed with the house staff. Please see their note for complete details. I concur with their findings.  Bartholomew Crews, MD 09/14/2014, 9:30 AM

## 2014-08-06 NOTE — Progress Notes (Signed)
Subjective:  SOB improving; no chest pain  Objective:  Vital Signs in the last 24 hours: Temp:  [97.6 F (36.4 C)] 97.6 F (36.4 C) (12/04 0500) Pulse Rate:  [71-79] 79 (12/04 1003) Resp:  [16] 16 (12/04 0500) BP: (99-126)/(58-72) 108/61 mmHg (12/04 1003) SpO2:  [97 %-99 %] 98 % (12/04 0500) Weight:  [180 lb 3.2 oz (81.738 kg)] 180 lb 3.2 oz (81.738 kg) (12/04 0500)  Intake/Output from previous day:  Intake/Output Summary (Last 24 hours) at 08/06/14 1108 Last data filed at 08/06/14 1059  Gross per 24 hour  Intake   1264 ml  Output   3600 ml  Net  -2336 ml    Physical Exam: General appearance: alert, cooperative and no distress HEENT: normal Neck: supple Lungs: CTA Heart: regular rate and rhythm Abd: soft; not tender or distended Extremities: trace edema Grossly intact   Rate: 78  Rhythm: normal sinus rhythm  Lab Results:  Recent Labs  08/04/14 1018 08/05/14 0423  WBC 11.0* 10.1  HGB 9.5* 8.8*  PLT 470* 492*    Recent Labs  08/05/14 0423 08/06/14 0345  NA 142 140  K 4.3 4.0  CL 106 102  CO2 22 23  GLUCOSE 88 97  BUN 49* 56*  CREATININE 5.97* 6.28*    Recent Labs  08/04/14 2251 08/05/14 0423  TROPONINI <0.30 <0.30   Cardiac Studies: Echo Jan 2015 Study Conclusions  - Left ventricle: The cavity size was normal. There was moderate concentric hypertrophy. Systolic function was low normal. The estimated ejection fraction was in the range of 50% to 55%. Regional wall motion abnormalities cannot be excluded. There is diastolic dysfunction with elevated LV filling pressure. - Aortic valve: Sclerosis without stenosis. No regurgitation. - Mitral valve: Mild regurgitation. - Left atrium: The atrium was mildly dilated (21 cm2). - Right ventricle: RV systolic pressure: 61WE Hg (S, est). Consistent with moderate pulmonary hypertension. - Right atrium: The atrium was normal in size. - Atrial septum: No defect or patent foramen  ovale was identified. - Inferior vena cava: The vessel was normal in size; the respirophasic diameter changes were in the normal range (= 50%); findings are consistent with normal central venous pressure. - Pericardium, extracardiac: A trivial pericardial effusion was identified. Features were not consistent with tamponade physiology.  Assessment/Plan:  53 y/o male, [facility] but was lost to follow up due to financial issues. Admitted 08/03/14 with acute on chronic diastolic CHF, uncontrolled HTN, and acute on chronic renal insufficiency secondary to medical non compliance. He claimed financial issues but his drug screen was positive for cocaine. EF in Jan 55% by echo.    Principal Problem:   Acute on chronic congestive heart failure Active Problems:   Anemia   Chronic diastolic HF (heart failure), NYHA class 4   Diabetes mellitus   CKD (chronic kidney disease), stage IV   Malignant hypertensive urgency   Non compliance w medication regimen   Cocaine abuse   Acute on chronic renal insufficiency   PLAN:  1 acute on chronic diastolic congestive heart failure-his volume status is improving. Renal function has deteriorated further. Nephrology is holding Lasix. Echocardiogram shows ejection fraction 50-55%. Grade 2 diastolic dysfunction. 2 hypertension-blood pressure is decreasing. Discontinue Norvasc and decrease hydralazine to 25 mg by mouth 3 times a day. Continue present dose of carvedilol and imdur. Further adjustment based on follow-up readings. 3 cocaine abuse-patient counseled previously on discontinuing. 4 acute on  chronic renal failure-nephrology seen. Patient will likely require dialysis.  Ellis Parents

## 2014-08-06 NOTE — Progress Notes (Signed)
Right  Upper Extremity Vein Map    Cephalic  Segment Diameter Depth Comment  1. Axilla 2.17mm    2. Mid upper arm 3.75mm    3. Above [facility] 3.41mm    4. In [facility] 5.63mm    5. Below AC 3.64mm    6. Mid forearm 3.83mm    7. Wrist 3.68mm                     Left Upper Extremity Vein Map    Cephalic  Segment Diameter Depth Comment  1. Axilla     2. Mid upper arm 4.65mm    3. Above [facility] 3.40mm    4. In AC 2.66mm    5. Below AC 2.70mm    6. Mid forearm 2.70mm    7. Wrist 2.8mm                    Basilic  Segment Diameter Depth Comment  1. Axilla mm mm   2. Mid upper arm 4.1mm 2.6mm   3. Above [facility] 2.32mm 2.25mm   4. In [facility]   Unable to visualize.  5. Below AC   Unable to visualize.  6. Mid forearm   Unable to visualize.  7. Wrist   Unable to visualize.                  Brachial  Segment Diameter Depth Comment  1. Proximal upper arm 5.5mm 28.30mm   2. Mid upper arm 4.42mm 2.63mm   3. Distal upper arm 5.59mm 8.43mm     08/06/2014 2:24 PM Reaghan Kawa, RVT, RDCS, RDMS

## 2014-08-06 NOTE — Progress Notes (Signed)
S: CO HA.  Eating OK.  O:BP 110/59 mmHg  Pulse 72  Temp(Src) 97.6 F (36.4 C) (Oral)  Resp 16  Ht 5\' 11"  (1.803 m)  Wt 81.738 kg (180 lb 3.2 oz)  BMI 25.14 kg/m2  SpO2 98%  Intake/Output Summary (Last 24 hours) at 08/06/14 0904 Last data filed at 08/06/14 0645  Gross per 24 hour  Intake   1384 ml  Output   4025 ml  Net  -2641 ml   Weight change: -8.528 kg (-18 lb 12.8 oz) MBE:MLJQG and alert CVS:RRR Resp: clear Abd: +BS NTND Ext:tr-1+ edema NEURO: CNI Ox3 no asterixis   . amLODipine  10 mg Oral Daily  . carvedilol  25 mg Oral BID WC  . furosemide  80 mg Intravenous TID  . heparin  5,000 Units Subcutaneous 3 times per day  . hydrALAZINE  50 mg Oral 3 times per day  . Influenza vac split quadrivalent PF  0.5 mL Intramuscular Tomorrow-1000  . isosorbide mononitrate  30 mg Oral Daily   Dg Chest Port 1 View  08/04/2014   CLINICAL DATA:  Shortness of breath. Chest pain. Initial encounter.  EXAM: PORTABLE CHEST - 1 VIEW  COMPARISON:  01/12/2014 and 09/06/2013.  FINDINGS: 1013 hr. The heart size and mediastinal contours are stable. There are chronic low lung volumes with associated bibasilar pulmonary opacities. Previously demonstrated bilateral pleural effusions have nearly completely resolved. There is no new airspace disease or pneumothorax. The osseous structures appear unchanged.  IMPRESSION: Overall improved aeration of the lung bases and decreased pleural effusions compared with prior examination. No acute findings demonstrated.   Electronically Signed   By: Camie Patience M.D.   On: 08/04/2014 10:57   BMET    Component Value Date/Time   NA 140 08/06/2014 0345   K 4.0 08/06/2014 0345   CL 102 08/06/2014 0345   CO2 23 08/06/2014 0345   GLUCOSE 97 08/06/2014 0345   BUN 56* 08/06/2014 0345   CREATININE 6.28* 08/06/2014 0345   CALCIUM 8.6 08/06/2014 0345   GFRNONAA 9* 08/06/2014 0345   GFRAA 11* 08/06/2014 0345   CBC    Component Value Date/Time   WBC 10.1 08/05/2014  0423   RBC 3.01* 08/05/2014 0423   RBC 3.24* 08/04/2014 1642   HGB 8.8* 08/05/2014 0423   HCT 27.7* 08/05/2014 0423   PLT 492* 08/05/2014 0423   MCV 92.0 08/05/2014 0423   MCH 29.2 08/05/2014 0423   MCHC 31.8 08/05/2014 0423   RDW 14.9 08/05/2014 0423   LYMPHSABS 1.7 08/04/2014 1018   MONOABS 0.7 08/04/2014 1018   EOSABS 0.7 08/04/2014 1018   BASOSABS 0.1 08/04/2014 1018     Assessment: 1. Acute on CKD 4 sec HTN 2. HTN 3. Anemia awaiting iron studies 4. Sec HPTH, awaiting PTH level 5. DM 6. Cocaine abuse 7. M spike of ? sig Plan: 1. Hold lasix today 2.  BP a little too well controlled.  Hold meds for SBP < 115.  Decrease hydralazine to BID 3. Re check labs in AM  Zanaiya Calabria T

## 2014-08-07 LAB — GLUCOSE, CAPILLARY
GLUCOSE-CAPILLARY: 133 mg/dL — AB (ref 70–99)
Glucose-Capillary: 132 mg/dL — ABNORMAL HIGH (ref 70–99)
Glucose-Capillary: 194 mg/dL — ABNORMAL HIGH (ref 70–99)
Glucose-Capillary: 130 mg/dL — ABNORMAL HIGH (ref 70–99)

## 2014-08-07 LAB — RENAL FUNCTION PANEL
Albumin: 1.8 g/dL — ABNORMAL LOW (ref 3.5–5.2)
Anion gap: 16 — ABNORMAL HIGH (ref 5–15)
BUN: 55 mg/dL — AB (ref 6–23)
CALCIUM: 8.6 mg/dL (ref 8.4–10.5)
CHLORIDE: 101 meq/L (ref 96–112)
CO2: 20 mEq/L (ref 19–32)
Creatinine, Ser: 6.59 mg/dL — ABNORMAL HIGH (ref 0.50–1.35)
GFR calc Af Amer: 10 mL/min — ABNORMAL LOW (ref >90)
GFR calc non Af Amer: 9 mL/min — ABNORMAL LOW (ref >90)
Glucose, Bld: 162 mg/dL — ABNORMAL HIGH (ref 70–99)
PHOSPHORUS: 6.8 mg/dL — AB (ref 2.3–4.6)
Potassium: 3.8 mEq/L (ref 3.7–5.3)
Sodium: 137 mEq/L (ref 137–147)

## 2014-08-07 MED ORDER — FUROSEMIDE 80 MG PO TABS
160.0000 mg | ORAL_TABLET | Freq: Two times a day (BID) | ORAL | Status: DC
  Administered 2014-08-07 – 2014-08-09 (×6): 160 mg via ORAL
  Filled 2014-08-07 (×7): qty 2

## 2014-08-07 MED ORDER — CALCITRIOL 0.25 MCG PO CAPS
0.2500 ug | ORAL_CAPSULE | Freq: Every day | ORAL | Status: DC
  Administered 2014-08-07 – 2014-08-09 (×3): 0.25 ug via ORAL
  Filled 2014-08-07 (×3): qty 1

## 2014-08-07 NOTE — Progress Notes (Signed)
Subjective:  No dyspnea; no chest pain  Objective:  Vital Signs in the last 24 hours: Temp:  [97.5 F (36.4 C)-98 F (36.7 C)] 98 F (36.7 C) (12/05 0532) Pulse Rate:  [74-80] 80 (12/05 0532) Resp:  [18-20] 18 (12/05 0532) BP: (108-143)/(61-87) 126/72 mmHg (12/05 0532) SpO2:  [98 %] 98 % (12/05 0532) Weight:  [179 lb 1.6 oz (81.24 kg)] 179 lb 1.6 oz (81.24 kg) (12/05 0532)  Intake/Output from previous day:  Intake/Output Summary (Last 24 hours) at 08/07/14 0947 Last data filed at 08/07/14 7209  Gross per 24 hour  Intake   1500 ml  Output   1300 ml  Net    200 ml    Physical Exam: General appearance: alert, cooperative and no distress HEENT: normal Neck: supple Lungs: CTA Heart: regular rate and rhythm Abd: soft; not tender or distended Extremities: no edema Grossly intact   Rate: 78  Rhythm: normal sinus rhythm with NSVT  Lab Results:  Recent Labs  08/04/14 1018 08/05/14 0423  WBC 11.0* 10.1  HGB 9.5* 8.8*  PLT 470* 492*    Recent Labs  08/06/14 0345 08/07/14 0635  NA 140 137  K 4.0 3.8  CL 102 101  CO2 23 20  GLUCOSE 97 162*  BUN 56* 55*  CREATININE 6.28* 6.59*    Recent Labs  08/04/14 2251 08/05/14 0423  TROPONINI <0.30 <0.30   Cardiac Studies: Echo Jan 2015 Study Conclusions  - Left ventricle: The cavity size was normal. There was moderate concentric hypertrophy. Systolic function was low normal. The estimated ejection fraction was in the range of 50% to 55%. Regional wall motion abnormalities cannot be excluded. There is diastolic dysfunction with elevated LV filling pressure. - Aortic valve: Sclerosis without stenosis. No regurgitation. - Mitral valve: Mild regurgitation. - Left atrium: The atrium was mildly dilated (21 cm2). - Right ventricle: RV systolic pressure: 47SJ Hg (S, est). Consistent with moderate pulmonary hypertension. - Right atrium: The atrium was normal in size. - Atrial septum: No defect or  patent foramen ovale was identified. - Inferior vena cava: The vessel was normal in size; the respirophasic diameter changes were in the normal range (= 50%); findings are consistent with normal central venous pressure. - Pericardium, extracardiac: A trivial pericardial effusion was identified. Features were not consistent with tamponade physiology.  Assessment/Plan:  53 y/o male, [facility] but was lost to follow up due to financial issues. Admitted 08/03/14 with acute on chronic diastolic CHF, uncontrolled HTN, and acute on chronic renal insufficiency secondary to medical non compliance. He claimed financial issues but his drug screen was positive for cocaine. EF in Jan 55% by echo.    Principal Problem:   Acute on chronic congestive heart failure Active Problems:   Anemia   Chronic diastolic HF (heart failure), NYHA class 4   Diabetes mellitus   CKD (chronic kidney disease), stage IV   Malignant hypertensive urgency   Non compliance w medication regimen   Cocaine abuse   Acute on chronic renal insufficiency   PLAN:  1 acute on chronic diastolic congestive heart failure-his volume status has improved. Renal function has deteriorated further. Nephrology is holding Lasix. Echocardiogram shows ejection fraction 50-55%. Grade 2 diastolic dysfunction. 2 hypertension-blood pressure is decreasing. Discontinue hydralazine and nitrates. Continue present dose of carvedilol. Further adjustment based on follow-up readings. 3 cocaine abuse-patient counseled previously on discontinuing. 4 acute on chronic renal failure-nephrology seen. Patient will  likely require dialysis.  Ellis Parents

## 2014-08-07 NOTE — Progress Notes (Signed)
Subjective:   Day of hospitalization: 3  VSS.  No overnight events.  Pt is feeling better today.  Dyspepsia has improved.  No CP or SOB.  Good UOP: pt is down 7.2L since admission, wt. down 20 lbs.   Objective:   Vital signs in last 24 hours: Filed Vitals:   08/06/14 2105 08/07/14 0532 08/07/14 1017 08/07/14 1348  BP: 126/70 126/72 122/64 120/61  Pulse: 74 80  77  Temp: 97.8 F (36.6 C) 98 F (36.7 C)  97.5 F (36.4 C)  TempSrc: Oral Oral  Oral  Resp: 20 18  20   Height:      Weight:  179 lb 1.6 oz (81.24 kg)    SpO2: 98% 98%  100%    Weight: Filed Weights   08/05/14 0528 08/06/14 0500 08/07/14 0532  Weight: 181 lb 1.6 oz (82.146 kg) 180 lb 3.2 oz (81.738 kg) 179 lb 1.6 oz (81.24 kg)    I/Os:  Intake/Output Summary (Last 24 hours) at 08/07/14 1402 Last data filed at 08/07/14 1327  Gross per 24 hour  Intake   1500 ml  Output    950 ml  Net    550 ml    Physical Exam: Constitutional: Vital signs reviewed.  Patient is sitting up/lying in bed in no acute distress and cooperative with exam.   HEENT: [place]/AT; PERRL, EOMI, conjunctivae normal, no scleral icterus  Cardiovascular: RRR, no MRG Pulmonary/Chest: normal respiratory effort, no accessory muscle use, CTAB, no wheezes, rales, or rhonchi Abdominal: Soft. +BS, NT/ND Neurological: A&O x3, CN II-XII grossly intact; non-focal exam Extremities: 2+DP b/l, no C/C/E  Skin: Warm, dry and intact. No rash  Lab Results:  BMP:  Recent Labs Lab 08/06/14 0345 08/07/14 0635  NA 140 137  K 4.0 3.8  CL 102 101  CO2 23 20  GLUCOSE 97 162*  BUN 56* 55*  CREATININE 6.28* 6.59*  CALCIUM 8.6 8.6  PHOS 6.5* 6.8*    CBC:  Recent Labs Lab 08/04/14 1018 08/05/14 0423  WBC 11.0* 10.1  NEUTROABS 7.8*  --   HGB 9.5* 8.8*  HCT 29.4* 27.7*  MCV 91.9 92.0  PLT 470* 492*    Coagulation: No results for input(s): LABPROT, INR in the last 168 hours.  CBG:            Recent Labs Lab 08/06/14 0625 08/06/14 1104  08/06/14 1713 08/06/14 2059 08/07/14 0559 08/07/14 1108  GLUCAP 104* 168* 160* 173* 133* 132*           HA1C:       Recent Labs Lab 08/04/14 1642  HGBA1C 5.5    Lipid Panel:  Recent Labs Lab 08/04/14 2300  CHOL 139  HDL 56  LDLCALC 67  TRIG 82  CHOLHDL 2.5    LFTs:  Recent Labs Lab 08/04/14 1007 08/05/14 0423 08/07/14 0635  AST 27 20  --   ALT 22 17  --   ALKPHOS 160* 139*  --   BILITOT 0.7 0.5  --   PROT 7.1 6.4  --   ALBUMIN 2.1* 1.9* 1.8*    Pancreatic Enzymes: No results for input(s): LIPASE, AMYLASE in the last 168 hours.  Lactic Acid/Procalcitonin: No results for input(s): LATICACIDVEN, PROCALCITON in the last 168 hours.  Ammonia: No results for input(s): AMMONIA in the last 168 hours.  Cardiac Enzymes:  Recent Labs Lab 08/04/14 1642 08/04/14 2251 08/05/14 0423  TROPONINI <0.30 <0.30 <0.30    EKG: EKG Interpretation  Date/Time:  Wednesday  August 04 2014 10:01:43 EST Ventricular Rate:  121 PR Interval:  119 QRS Duration: 88 QT Interval:  335 QTC Calculation: 475 R Axis:   69 Text Interpretation:  Age not entered, assumed to be  53 years old for purpose of ECG interpretation Sinus tachycardia Nonspecific T abnormalities, lateral leads Borderline prolonged QT interval Confirmed by ZAMMIT  MD, JOSEPH ([id number]) on 08/04/2014 12:27:05 PM   BNP:  Recent Labs Lab 08/04/14 1007  PROBNP 52226.0*   Urinalysis:  Recent Labs Lab 08/04/14 1709  COLORURINE YELLOW  LABSPEC 1.010  PHURINE 7.0  GLUCOSEU 100*  HGBUR SMALL*  BILIRUBINUR NEGATIVE  KETONESUR NEGATIVE  PROTEINUR >300*  UROBILINOGEN 0.2  NITRITE NEGATIVE  LEUKOCYTESUR NEGATIVE   Blood Culture:    Component Value Date/Time   SDES URINE, CLEAN CATCH 09/06/2013 1527   SPECREQUEST NONE 09/06/2013 1527   CULT  09/06/2013 1527    INSIGNIFICANT GROWTH Performed at [place] [facility] 09/07/2013 FINAL 09/06/2013 1527    Studies/Results: No results  found.  Medications:  Scheduled Meds: . calcitRIOL  0.25 mcg Oral Daily  . carvedilol  25 mg Oral BID WC  . famotidine  20 mg Oral Daily  . furosemide  160 mg Oral BID  . heparin  5,000 Units Subcutaneous 3 times per day   Continuous Infusions:  PRN Meds: acetaminophen **OR** acetaminophen, docusate sodium, guaifenesin  Antibiotics: Antibiotics Given (last 72 hours)    None      Day of Hospitalization: 3  Consults: Treatment Team:  Rounding [facility], MD Sinclair Grooms, MD Windy Kalata, MD Conrad [place], MD  Assessment/Plan:   Principal Problem:   Acute on chronic congestive heart failure Active Problems:   Anemia   Chronic diastolic HF (heart failure), NYHA class 4   Diabetes mellitus   CKD (chronic kidney disease), stage IV   Malignant hypertensive urgency   Non compliance w medication regimen   Cocaine abuse   Acute on chronic renal insufficiency  Malignant Hypertension Resolved with reinstatement of BP meds.  Likely secondary to cocaine use and noncompliance with meds.  BNP 52226.  -continue carvedilol 25 mg bid -continue lasix 160mg  bid per renal's recs  -cards d/c hydralazine and nitrates due to soft BP, continued cardvedilol -appreciate cardiology and nephrology following  Acute on Chronic NYHA Class 4 Diastolic Heart Failure Does not appear volume overloaded on exam today.  Lungs clear, no pitting peripheral edema.  Down ~7L since admission, weight down ~20 lbs?  Echo had no significant change from LVEF 50-55% and grade 2 diastolic dysfunction (unchanged from 2014) -continue mgmt per cards and nephrology   Acute on chronic CKD (stage IV) Likely due to long-standing uncontrolled HTN and DMII.  Baseline SCr~3.57, now 6.28.  SPEP, IFE, free light chains (total protein ELP 6.0, IgG 697, IgA 1270(H), IgM serum 41(L), kappa free light chain 60.50(H) lamda free light chain 6.18(H), kappa, lamda light chain 9.79(H). PTH 177(H), phosphorus 6.5 (H),  TSH 2.060.  Vein mapping completed.    -mgmt per nephrology   Normocytic Anemia: Likely secondary to chronic renal disease. 9.5-->8.8. Basline ~9.7. No active bleeding. Iron studies: Iron 34, UIBC 205, TIBC 239, Ferritin 227, Folate 19.8. - Received IV iron yesterday  Chest Pain Resolved. Likely secondary to cocaine use.  Possible dyspepsia.   -d/c imdur -famotidine 20 mg PO BID  DMII HA1c 5.5% on 08/04/14.   -SSI  Socioeconomic issues -care management following  Cocaine Abuse Patient ready to quit using  cocaine; states that he is "not addicted" and "messed up" -continue to encourage cessation   F/E/N Fluids- None Electrolytes- Replete as needed  Nutrition- renal/carb modified w/1233ml fluid restriction  VTE PPx  5000 Units Heparin SQ tid   Disposition Disposition is deferred, awaiting improvement of current medical problems.  Anticipated discharge in approximately 1-2 day(s).    LOS: 3 days   Jones Bales, MD PGY-1, Internal Medicine Teaching Service [phone] (7AM-5PM Mon-Fri) 08/07/2014, 2:02 PM

## 2014-08-07 NOTE — Progress Notes (Signed)
S: No new CO O:BP 126/72 mmHg  Pulse 80  Temp(Src) 98 F (36.7 C) (Oral)  Resp 18  Ht 5\' 11"  (1.803 m)  Wt 81.24 kg (179 lb 1.6 oz)  BMI 24.99 kg/m2  SpO2 98%  Intake/Output Summary (Last 24 hours) at 08/07/14 0840 Last data filed at 08/07/14 0800  Gross per 24 hour  Intake   1260 ml  Output   1300 ml  Net    -40 ml   Weight change: -0.498 kg (-1 lb 1.6 oz) JJH:ERDEY and alert CVS:RRR Resp: clear Abd: +BS NTND Ext:tr-1+ edema NEURO: CNI Ox3 no asterixis   . carvedilol  25 mg Oral BID WC  . famotidine  20 mg Oral Daily  . heparin  5,000 Units Subcutaneous 3 times per day  . hydrALAZINE  25 mg Oral 3 times per day  . isosorbide mononitrate  30 mg Oral Daily   No results found. BMET    Component Value Date/Time   NA 137 08/07/2014 0635   K 3.8 08/07/2014 0635   CL 101 08/07/2014 0635   CO2 20 08/07/2014 0635   GLUCOSE 162* 08/07/2014 0635   BUN 55* 08/07/2014 0635   CREATININE 6.59* 08/07/2014 0635   CALCIUM 8.6 08/07/2014 0635   GFRNONAA 9* 08/07/2014 0635   GFRAA 10* 08/07/2014 0635   CBC    Component Value Date/Time   WBC 10.1 08/05/2014 0423   RBC 3.01* 08/05/2014 0423   RBC 3.24* 08/04/2014 1642   HGB 8.8* 08/05/2014 0423   HCT 27.7* 08/05/2014 0423   PLT 492* 08/05/2014 0423   MCV 92.0 08/05/2014 0423   MCH 29.2 08/05/2014 0423   MCHC 31.8 08/05/2014 0423   RDW 14.9 08/05/2014 0423   LYMPHSABS 1.7 08/04/2014 1018   MONOABS 0.7 08/04/2014 1018   EOSABS 0.7 08/04/2014 1018   BASOSABS 0.1 08/04/2014 1018     Assessment: 1. Acute on CKD 4 sec HTN/DM   Scr sl higher 2. HTN 3. Anemia SP IV Iron 4. Sec HPTH 5. DM 6. Cocaine abuse 7. M spike of ? sig Plan: 1. Start calcitriol 2. Await SPEP 3. Daily renal [name] T

## 2014-08-08 ENCOUNTER — Encounter (HOSPITAL_COMMUNITY): Payer: Self-pay

## 2014-08-08 DIAGNOSIS — F1410 Cocaine abuse, uncomplicated: Secondary | ICD-10-CM

## 2014-08-08 DIAGNOSIS — N179 Acute kidney failure, unspecified: Secondary | ICD-10-CM

## 2014-08-08 DIAGNOSIS — Z9114 Patient's other noncompliance with medication regimen: Secondary | ICD-10-CM

## 2014-08-08 DIAGNOSIS — I5043 Acute on chronic combined systolic (congestive) and diastolic (congestive) heart failure: Secondary | ICD-10-CM

## 2014-08-08 DIAGNOSIS — N189 Chronic kidney disease, unspecified: Secondary | ICD-10-CM

## 2014-08-08 LAB — RENAL FUNCTION PANEL
Albumin: 2 g/dL — ABNORMAL LOW (ref 3.5–5.2)
Anion gap: 15 (ref 5–15)
BUN: 61 mg/dL — ABNORMAL HIGH (ref 6–23)
CALCIUM: 8.6 mg/dL (ref 8.4–10.5)
CO2: 23 meq/L (ref 19–32)
CREATININE: 6.8 mg/dL — AB (ref 0.50–1.35)
Chloride: 100 mEq/L (ref 96–112)
GFR calc Af Amer: 10 mL/min — ABNORMAL LOW (ref >90)
GFR, EST NON AFRICAN AMERICAN: 8 mL/min — AB (ref >90)
Glucose, Bld: 87 mg/dL (ref 70–99)
PHOSPHORUS: 7 mg/dL — AB (ref 2.3–4.6)
Potassium: 3.7 mEq/L (ref 3.7–5.3)
Sodium: 138 mEq/L (ref 137–147)

## 2014-08-08 LAB — GLUCOSE, CAPILLARY
GLUCOSE-CAPILLARY: 114 mg/dL — AB (ref 70–99)
Glucose-Capillary: 99 mg/dL (ref 70–99)
Glucose-Capillary: 197 mg/dL — ABNORMAL HIGH (ref 70–99)
Glucose-Capillary: 154 mg/dL — ABNORMAL HIGH (ref 70–99)

## 2014-08-08 MED ORDER — CALCIUM ACETATE 667 MG PO CAPS
667.0000 mg | ORAL_CAPSULE | Freq: Three times a day (TID) | ORAL | Status: DC
  Administered 2014-08-08 – 2014-08-09 (×4): 667 mg via ORAL
  Filled 2014-08-08 (×6): qty 1

## 2014-08-08 NOTE — Progress Notes (Signed)
On lasix 160 mg po BID per renal. Appears to be nearing dialysis. Renal managing volume status at this time.  Call with questions.  Pixie Casino, MD, [facility] Attending Cardiologist [place]

## 2014-08-08 NOTE — Progress Notes (Signed)
S: No new CO O:BP 150/75 mmHg  Pulse 85  Temp(Src) 98 F (36.7 C) (Oral)  Resp 20  Ht 5\' 11"  (1.803 m)  Wt 80 kg (176 lb 5.9 oz)  BMI 24.61 kg/m2  SpO2 100%  Intake/Output Summary (Last 24 hours) at 08/08/14 0813 Last data filed at 08/08/14 0700  Gross per 24 hour  Intake   1450 ml  Output   4075 ml  Net  -2625 ml   Weight change: -1.24 kg (-2 lb 11.7 oz) BTY:OMAYO and alert CVS:RRR Resp: clear Abd: +BS NTND Ext:tr-1+ edema NEURO: CNI Ox3 no asterixis   . calcitRIOL  0.25 mcg Oral Daily  . calcium acetate  667 mg Oral TID WC  . carvedilol  25 mg Oral BID WC  . famotidine  20 mg Oral Daily  . furosemide  160 mg Oral BID  . heparin  5,000 Units Subcutaneous 3 times per day   No results found. BMET    Component Value Date/Time   NA 138 08/08/2014 0434   K 3.7 08/08/2014 0434   CL 100 08/08/2014 0434   CO2 23 08/08/2014 0434   GLUCOSE 87 08/08/2014 0434   BUN 61* 08/08/2014 0434   CREATININE 6.80* 08/08/2014 0434   CALCIUM 8.6 08/08/2014 0434   GFRNONAA 8* 08/08/2014 0434   GFRAA 10* 08/08/2014 0434   CBC    Component Value Date/Time   WBC 10.1 08/05/2014 0423   RBC 3.01* 08/05/2014 0423   RBC 3.24* 08/04/2014 1642   HGB 8.8* 08/05/2014 0423   HCT 27.7* 08/05/2014 0423   PLT 492* 08/05/2014 0423   MCV 92.0 08/05/2014 0423   MCH 29.2 08/05/2014 0423   MCHC 31.8 08/05/2014 0423   RDW 14.9 08/05/2014 0423   LYMPHSABS 1.7 08/04/2014 1018   MONOABS 0.7 08/04/2014 1018   EOSABS 0.7 08/04/2014 1018   BASOSABS 0.1 08/04/2014 1018     Assessment: 1. Acute on CKD 4 sec HTN/DM   Scr sl higher 2. HTN 3. Anemia SP IV Iron 4. Sec HPTH   PTH pending 5. DM 6. Cocaine abuse 7. M spike of ? sig Plan: 1.  PO4 binder 2. Cont diuresis Becka Lagasse T

## 2014-08-08 NOTE — Progress Notes (Signed)
      Pending upper extremity vein mapping to plan dialysis access.     Huntley Knoop MAUREEN PA-C

## 2014-08-08 NOTE — Progress Notes (Signed)
Subjective: [name] feels well this morning. He believes he has lost a lot of "water weight". He is looking forward to seeing the instructional video on HD and understands the plan.   Objective: Vital signs in last 24 hours: Filed Vitals:   08/07/14 1017 08/07/14 1348 08/07/14 2011 08/08/14 0542  BP: 122/64 120/61 141/81 150/75  Pulse:  77 81 85  Temp:  97.5 F (36.4 C) 97.7 F (36.5 C) 98 F (36.7 C)  TempSrc:  Oral Oral Oral  Resp:  20 20 20   Height:      Weight:    176 lb 5.9 oz (80 kg)  SpO2:  100% 98% 100%   Weight change: -2 lb 11.7 oz (-1.24 kg)  Intake/Output Summary (Last 24 hours) at 08/08/14 0749 Last data filed at 08/08/14 0544  Gross per 24 hour  Intake   1450 ml  Output   4050 ml  Net  -2600 ml   Physical Exam: Appearance: in NAD sitting in bed watching TV, appears comfortable HEENT: no periorbital edema, [place]/AT, PERRL, EOMi Heart: RRR, normal S1S2, JVD present Lungs: CTAB  Abdomen: BS+, soft, nontender Musculoskeletal: several areas of hyperpigmented skin on back Extremities: 1+ pitting edema BLE (decreased from admission), left great toe amputated, small open wound left knee Neurologic: A&Ox3, appropriately interactive, grossly intact Skin: no rashes or lesions  Lab Results: Basic Metabolic Panel:  Recent Labs Lab 08/07/14 0635 08/08/14 0434  NA 137 138  K 3.8 3.7  CL 101 100  CO2 20 23  GLUCOSE 162* 87  BUN 55* 61*  CREATININE 6.59* 6.80*  CALCIUM 8.6 8.6  PHOS 6.8* 7.0*   Liver Function Tests:  Recent Labs Lab 08/04/14 1007 08/05/14 0423 08/07/14 0635 08/08/14 0434  AST 27 20  --   --   ALT 22 17  --   --   ALKPHOS 160* 139*  --   --   BILITOT 0.7 0.5  --   --   PROT 7.1 6.4  --   --   ALBUMIN 2.1* 1.9* 1.8* 2.0*   CBC:  Recent Labs Lab 08/04/14 1018 08/05/14 0423  WBC 11.0* 10.1  NEUTROABS 7.8*  --   HGB 9.5* 8.8*  HCT 29.4* 27.7*  MCV 91.9 92.0  PLT 470* 492*   Cardiac Enzymes:  Recent Labs Lab  08/04/14 1642 08/04/14 2251 08/05/14 0423  TROPONINI <0.30 <0.30 <0.30   BNP:  Recent Labs Lab 08/04/14 1007  PROBNP 52226.0*   CBG:  Recent Labs Lab 08/06/14 2059 08/07/14 0559 08/07/14 1108 08/07/14 1612 08/07/14 2136 08/08/14 0547  GLUCAP 173* 133* 132* 194* 130* 99   Hemoglobin A1C:  Recent Labs Lab 08/04/14 1642  HGBA1C 5.5   Fasting Lipid Panel:  Recent Labs Lab 08/04/14 2300  CHOL 139  HDL 56  LDLCALC 67  TRIG 82  CHOLHDL 2.5   Thyroid Function Tests:  Recent Labs Lab 08/06/14 0345  TSH 2.060   Anemia Panel:  Recent Labs Lab 08/04/14 1642  VITAMINB12 717  FOLATE 19.8  FERRITIN 227  TIBC 239  IRON 34*  RETICCTPCT 1.7   HIV nonreactive 08/04/14  Urine Drug Screen: Drugs of Abuse     Component Value Date/Time   LABOPIA NONE DETECTED 08/04/2014 2032   COCAINSCRNUR POSITIVE* 08/04/2014 2032   LABBENZ NONE DETECTED 08/04/2014 2032   AMPHETMU NONE DETECTED 08/04/2014 2032   THCU NONE DETECTED 08/04/2014 2032   LABBARB NONE DETECTED 08/04/2014 2032    Urinalysis:  Recent Labs  Lab 08/04/14 1709  COLORURINE YELLOW  LABSPEC 1.010  PHURINE 7.0  GLUCOSEU 100*  HGBUR SMALL*  BILIRUBINUR NEGATIVE  KETONESUR NEGATIVE  PROTEINUR >300*  UROBILINOGEN 0.2  NITRITE NEGATIVE  LEUKOCYTESUR NEGATIVE   Studies/Results: No results found. Medications: I have reviewed the patient's current medications. Scheduled Meds: . calcitRIOL  0.25 mcg Oral Daily  . carvedilol  25 mg Oral BID WC  . famotidine  20 mg Oral Daily  . furosemide  160 mg Oral BID  . heparin  5,000 Units Subcutaneous 3 times per day   2D echo 08/05/14: Study Conclusions - Left ventricle: The cavity size was normal. There was mild concentric hypertrophy. Systolic function was normal. The estimated ejection fraction was in the range of 50% to 55%. D-shpaed septum consistent with elevated right ventricular pressure/volume overload. There is hypokinesis of  the basal-midinferoseptal myocardium. Features are consistent with a pseudonormal left ventricular filling pattern, with concomitant abnormal relaxation and increased filling pressure (grade 2 diastolic dysfunction). - Mitral valve: There was mild regurgitation. - Left atrium: The atrium was moderately dilated. - Right atrium: The atrium was mildly dilated. - Pulmonary arteries: Systolic pressure was moderately increased. PA peak pressure: 48 mm Hg (S). - Pericardium, extracardiac: A small pericardial effusion was identified along the right ventricular free wall. There was no evidence of hemodynamic compromise. Impressions: - Compared to the prior study, there has been no significant interval change.  Continuous Infusions:  PRN Meds:.acetaminophen **OR** acetaminophen, docusate sodium, guaifenesin Assessment/Plan: Principal Problem:   Acute on chronic congestive heart failure Active Problems:   Anemia   Chronic diastolic HF (heart failure), NYHA class 4   Diabetes mellitus   CKD (chronic kidney disease), stage IV   Malignant hypertensive urgency   Non compliance w medication regimen   Cocaine abuse   Acute on chronic renal insufficiency  [name] is a 53 yo man with chronic diastolic heart failure, chronic stage IV renal failure and hypertension who was admitted for evaluation of acutely high blood pressure, leg swelling and shortness of breath in the setting of missing his medications for a week. He had malignant hypertension on admission; he was also found to be cocaine +. Need to determine end-point goals for this hospitalization.  Malignant Hypertension: Now 150/75 max in last 24 hours. HTN was aggressively reduced on home meds. BP to 240/176 in the ED. ProBNP 52226. I-stat troponin 0.08. Episode was likely secondary to noncompliance with medications and cocaine use. Home medications: amlodipine 10 mg daily, hydralazine 10 mg TID, lisinopril 5 mg,  metoprolol XL 25 mg.  - Stop metoprolol (switch to carvedilol 25 mg BID) in context of cocaine use - Stop clonidine as patient's questionable compliance puts him at risk of rebound in the future - Stop hydralazine, amlodipine, imdur and isordil due to soft BP - Hold BP meds for SBP <115 as BP has been too well controlled - Renal has decreased lasix from IV to 160 mg PO BID in context of rising creatinine - Monitor vitals closely  - Appreciate cardiology and nephrology following  Acute on Chronic NYHA Class 4 Diastolic Heart Failure: Volume status is improving and patient is down 23 lbs since admission. BNP initially elevated to 522[address] (in the setting of CKD and malignant htn). Significant LE edema. That said, weight on admission was down 19 lbs from last admission (may be somewhat intentional) and CXR showed improved aeration of the lung bases and decreased pleural effusions compared to last. Echo showed LVEF 50-55% and  grade 2 diastolic dysfunction (unchanged from 2014). Home lasix 80 mg BID. Lipid panel WNL except LDL 67. - Renal has decreased lasix from IV to 160 mg PO BID in context of rising creatinine - Tele - Better plan for outpatient medication ascertainment  Acute on chronic CKD (stage IV): There is evidence of kidney injury progression; likely at least partly secondary to malignant hypertension. Baseline SCr~3.57, now 5.34-->5.97-->6.28-->6.28-->6.8. Patient has normocytic anemia (hemoglobin 8.8). Total protein ELP 6.0, IgG 697, IgA 1270(H), IgM serum 41(L), kappa free light chain 60.50(H) lamda free light chain 6.18(H), kappa, lamda light chain 9.79(H). PTH 177(H), phosphorus 6.5 (H), TSH 2.060. Patient will likely require dialysis. Vein mapping was completed on Friday, left arm.  - SPEP and IFE pending - Started calcitriol - Daily renal panel - Appreciate nephrology management  Normocytic Anemia: Likely secondary to chronic renal disease. 9.5-->8.8. Basline ~9.7. No active bleeding.  Iron studies: Iron 34, UIBC 205, TIBC 239, Ferritin 227, Folate 19.8. - Received IV iron  M Spike of ? Significance: M spike noted in 09/2013 - Rechecking free light chains IFE, pending  Chest Pain: Resolved. Has a history of angina; on home imdur and isordil. Troponins negative x3. Described some new burning with a sour taste in his mouth over the past 2 days. - D/c home imdur / isordil - Famotidine 20 mg PO BID  DMII: A1c 5.5% on 08/04/14 - ISS  Patient Running out of Medications: - Consult to Care Management; patient receives medications from [facility](?); many financial hardships; believes he may lose his job  Cocaine Abuse: - Patient ready to quit using cocaine; states that he is "not addicted" and "messed up" - Cessation counseling  Diet:  - Renal/carb mod - Fluid restriction 1200 ml   DVT Ppx: - [place] heparin  Dispo: Disposition is deferred at this time, awaiting improvement of current medical problems.  Anticipated discharge in approximately 1-2 day(s).   The patient does have a current PCP (Arnoldo Morale, MD) and does not need an [facility] hospital follow-up appointment after discharge.  The patient does have transportation limitations that hinder transportation to clinic appointments.  .Services Needed at time of discharge: Y = Yes, Blank = No PT:   OT:   RN:   Equipment:   Other:     LOS: 4 days   Drucilla Schmidt, MD 08/08/2014, 7:49 AM

## 2014-08-09 LAB — GLUCOSE, CAPILLARY
Glucose-Capillary: 101 mg/dL — ABNORMAL HIGH (ref 70–99)
Glucose-Capillary: 159 mg/dL — ABNORMAL HIGH (ref 70–99)
Glucose-Capillary: 121 mg/dL — ABNORMAL HIGH (ref 70–99)

## 2014-08-09 LAB — IMMUNOFIXATION ELECTROPHORESIS
IGA: 1270 mg/dL — AB (ref 68–379)
IgG (Immunoglobin G), Serum: 697 mg/dL (ref 650–1600)
IgM, Serum: 41 mg/dL — ABNORMAL LOW (ref 41–251)
Total Protein ELP: 6 g/dL (ref 6.0–8.3)

## 2014-08-09 LAB — KAPPA/LAMBDA LIGHT CHAINS
KAPPA FREE LGHT CHN: 60.8 mg/dL — AB (ref 0.33–1.94)
Kappa, lambda light chain ratio: 9.61 — ABNORMAL HIGH (ref 0.26–1.65)
LAMDA FREE LIGHT CHAINS: 6.33 mg/dL — AB (ref 0.57–2.63)

## 2014-08-09 LAB — PARATHYROID HORMONE, INTACT (NO CA): PTH: 249 pg/mL — ABNORMAL HIGH (ref 14–64)

## 2014-08-09 MED ORDER — CALCIUM ACETATE 667 MG PO CAPS: 1334.0000 mg | ORAL_CAPSULE | Freq: Three times a day (TID) | ORAL | Status: DC

## 2014-08-09 MED ORDER — CALCIUM ACETATE 667 MG PO CAPS
1334.0000 mg | ORAL_CAPSULE | Freq: Three times a day (TID) | ORAL | Status: DC
  Administered 2014-08-09: 1334 mg via ORAL
  Filled 2014-08-09 (×2): qty 2

## 2014-08-09 MED ORDER — FUROSEMIDE 80 MG PO TABS: 160.0000 mg | ORAL_TABLET | Freq: Two times a day (BID) | ORAL | Status: DC

## 2014-08-09 MED ORDER — CARVEDILOL 25 MG PO TABS: 25.0000 mg | ORAL_TABLET | Freq: Two times a day (BID) | ORAL | Status: DC

## 2014-08-09 MED ORDER — CALCITRIOL 0.25 MCG PO CAPS: 0.2500 ug | ORAL_CAPSULE | Freq: Every day | ORAL | Status: DC

## 2014-08-09 MED ORDER — FAMOTIDINE 20 MG PO TABS: 20.0000 mg | ORAL_TABLET | Freq: Every day | ORAL | Status: DC

## 2014-08-09 MED ORDER — FAMOTIDINE 20 MG PO TABS
20.0000 mg | ORAL_TABLET | Freq: Every day | ORAL | Status: DC
Start: 2014-08-09 — End: 2014-09-15

## 2014-08-09 NOTE — Progress Notes (Signed)
   Daily Progress Note  Assessment/Planning: chronic kidney disease stage V    Final vein mapping not available due problems with MERGE system  Regardless, appears pt is a candidate for possible L BC AVF vs staged BVT or BRVT   Will schedule for a L arm fistula later this week.  Waiting on scheduling to determine final date Risk, benefits, and alternatives to access surgery were discussed.  The patient is aware the risks include but are not limited to: bleeding, infection, steal syndrome, nerve damage, ischemic monomelic neuropathy, failure to mature, need for additional procedures, death and stroke.   The patient agrees to proceed forward with the procedure.  Subjective   No complaints  Objective Filed Vitals:   08/08/14 0542 08/08/14 1402 08/08/14 2012 08/09/14 0448  BP: 150/75 119/56 137/73 152/82  Pulse: 85 81 78 85  Temp: 98 F (36.7 C) 98.2 F (36.8 C) 98 F (36.7 C) 98.6 F (37 C)  TempSrc: Oral Oral Oral Oral  Resp: 20 20 20 20   Height:      Weight: 176 lb 5.9 oz (80 kg)   170 lb 10.2 oz (77.4 kg)  SpO2: 100% 100% 100% 97%    Intake/Output Summary (Last 24 hours) at 08/09/14 0918 Last data filed at 08/09/14 0806  Gross per 24 hour  Intake   1300 ml  Output   4125 ml  Net  -2825 ml    VASC  L arm palpable radial and brachial pulse, no IV in L arm  Laboratory CBC    Component Value Date/Time   WBC 10.1 08/05/2014 0423   HGB 8.8* 08/05/2014 0423   HCT 27.7* 08/05/2014 0423   PLT 492* 08/05/2014 0423    BMET    Component Value Date/Time   NA 138 08/08/2014 0434   K 3.7 08/08/2014 0434   CL 100 08/08/2014 0434   CO2 23 08/08/2014 0434   GLUCOSE 87 08/08/2014 0434   BUN 61* 08/08/2014 0434   CREATININE 6.80* 08/08/2014 0434   CALCIUM 8.6 08/08/2014 0434   GFRNONAA 8* 08/08/2014 0434   GFRAA 10* 08/08/2014 [place], MD [facility] Office: [phone] Pager: [phone]  08/09/2014, 9:18  AM

## 2014-08-09 NOTE — Progress Notes (Signed)
Subjective: [name] feels well this morning. No changes. He discusses that he is happy to have lost so much "water weight". He has seen the HD video and found it informative but out of date.  Objective: Vital signs in last 24 hours: Filed Vitals:   08/08/14 0542 08/08/14 1402 08/08/14 2012 08/09/14 0448  BP: 150/75 119/56 137/73 152/82  Pulse: 85 81 78 85  Temp: 98 F (36.7 C) 98.2 F (36.8 C) 98 F (36.7 C) 98.6 F (37 C)  TempSrc: Oral Oral Oral Oral  Resp: 20 20 20 20   Height:      Weight: 176 lb 5.9 oz (80 kg)   170 lb 10.2 oz (77.4 kg)  SpO2: 100% 100% 100% 97%   Weight change: -5 lb 11.7 oz (-2.6 kg)  Intake/Output Summary (Last 24 hours) at 08/09/14 0951 Last data filed at 08/09/14 0945  Gross per 24 hour  Intake   1420 ml  Output   4500 ml  Net  -3080 ml   Physical Exam: Appearance: in NAD sitting in bed watching football replays on TV, appears comfortable HEENT: no periorbital edema, [place]/AT, PERRL, EOMi Heart: RRR, normal S1S2 Lungs: CTAB  Abdomen: BS+, soft, nontender Musculoskeletal: several areas of hyperpigmented skin on back Extremities: trace edema BLE (greatly decreased from admission), left great toe amputated, small open wound left knee that is healing Neurologic: A&Ox3, appropriately interactive, grossly intact Skin: no rashes or lesions  Lab Results: Basic Metabolic Panel:  Recent Labs Lab 08/07/14 0635 08/08/14 0434  NA 137 138  K 3.8 3.7  CL 101 100  CO2 20 23  GLUCOSE 162* 87  BUN 55* 61*  CREATININE 6.59* 6.80*  CALCIUM 8.6 8.6  PHOS 6.8* 7.0*   Liver Function Tests:  Recent Labs Lab 08/04/14 1007 08/05/14 0423 08/07/14 0635 08/08/14 0434  AST 27 20  --   --   ALT 22 17  --   --   ALKPHOS 160* 139*  --   --   BILITOT 0.7 0.5  --   --   PROT 7.1 6.4  --   --   ALBUMIN 2.1* 1.9* 1.8* 2.0*   CBC:  Recent Labs Lab 08/04/14 1018 08/05/14 0423  WBC 11.0* 10.1  NEUTROABS 7.8*  --   HGB 9.5* 8.8*  HCT  29.4* 27.7*  MCV 91.9 92.0  PLT 470* 492*   Cardiac Enzymes:  Recent Labs Lab 08/04/14 1642 08/04/14 2251 08/05/14 0423  TROPONINI <0.30 <0.30 <0.30   BNP:  Recent Labs Lab 08/04/14 1007  PROBNP 52226.0*   CBG:  Recent Labs Lab 08/07/14 2136 08/08/14 0547 08/08/14 1115 08/08/14 1611 08/08/14 2041 08/09/14 0602  GLUCAP 130* 99 114* 197* 154* 101*   Hemoglobin A1C:  Recent Labs Lab 08/04/14 1642  HGBA1C 5.5   Fasting Lipid Panel:  Recent Labs Lab 08/04/14 2300  CHOL 139  HDL 56  LDLCALC 67  TRIG 82  CHOLHDL 2.5   Thyroid Function Tests:  Recent Labs Lab 08/06/14 0345  TSH 2.060   Anemia Panel:  Recent Labs Lab 08/04/14 1642  VITAMINB12 717  FOLATE 19.8  FERRITIN 227  TIBC 239  IRON 34*  RETICCTPCT 1.7   HIV nonreactive 08/04/14  Urine Drug Screen: Drugs of Abuse     Component Value Date/Time   LABOPIA NONE DETECTED 08/04/2014 2032   COCAINSCRNUR POSITIVE* 08/04/2014 2032   LABBENZ NONE DETECTED 08/04/2014 2032   AMPHETMU NONE DETECTED 08/04/2014 2032   THCU NONE DETECTED 08/04/2014  2032   LABBARB NONE DETECTED 08/04/2014 2032    Urinalysis:  Recent Labs Lab 08/04/14 1709  COLORURINE YELLOW  LABSPEC 1.010  PHURINE 7.0  GLUCOSEU 100*  HGBUR SMALL*  BILIRUBINUR NEGATIVE  KETONESUR NEGATIVE  PROTEINUR >300*  UROBILINOGEN 0.2  NITRITE NEGATIVE  LEUKOCYTESUR NEGATIVE   Studies/Results: No results found. Medications: I have reviewed the patient's current medications. Scheduled Meds: . calcitRIOL  0.25 mcg Oral Daily  . calcium acetate  667 mg Oral TID WC  . carvedilol  25 mg Oral BID WC  . famotidine  20 mg Oral Daily  . furosemide  160 mg Oral BID  . heparin  5,000 Units Subcutaneous 3 times per day   2D echo 08/05/14: Study Conclusions - Left ventricle: The cavity size was normal. There was mild concentric hypertrophy. Systolic function was normal. The estimated ejection fraction was in the range of 50% to  55%. D-shpaed septum consistent with elevated right ventricular pressure/volume overload. There is hypokinesis of the basal-midinferoseptal myocardium. Features are consistent with a pseudonormal left ventricular filling pattern, with concomitant abnormal relaxation and increased filling pressure (grade 2 diastolic dysfunction). - Mitral valve: There was mild regurgitation. - Left atrium: The atrium was moderately dilated. - Right atrium: The atrium was mildly dilated. - Pulmonary arteries: Systolic pressure was moderately increased. PA peak pressure: 48 mm Hg (S). - Pericardium, extracardiac: A small pericardial effusion was identified along the right ventricular free wall. There was no evidence of hemodynamic compromise. Impressions: - Compared to the prior study, there has been no significant interval change.  Continuous Infusions:  PRN Meds:.acetaminophen **OR** acetaminophen, docusate sodium, guaifenesin Assessment/Plan: Principal Problem:   Acute on chronic congestive heart failure Active Problems:   Anemia   Chronic diastolic HF (heart failure), NYHA class 4   Diabetes mellitus   CKD (chronic kidney disease), stage IV   Malignant hypertensive urgency   Non compliance w medication regimen   Cocaine abuse   Acute on chronic renal insufficiency  [name] is a 53 yo man with chronic diastolic heart failure, chronic stage IV renal failure and hypertension who was admitted for evaluation of acutely high blood pressure, leg swelling and shortness of breath in the setting of missing his medications for a week. He had malignant hypertension on admission; he was also found to be cocaine +. Now awaiting AV fistula placement; possibly to be scheduled later in the week. CM helping to determine whether he could get his Medicare in place such that fistula placement could be performed as outpatient.  Malignant Hypertension: Now 150/75 max in last 24 hours.  HTN was aggressively reduced on home meds. BP to 240/176 in the ED. ProBNP 52226. I-stat troponin 0.08. Episode was likely secondary to noncompliance with medications and cocaine use. Home medications: amlodipine 10 mg daily, hydralazine 10 mg TID, lisinopril 5 mg, metoprolol XL 25 mg. Clonidine, hydralazine, amlodipine, imdur and isordil have been stopped. Meroprolol has been switched to carvedilol. - Stop metoprolol (switch to carvedilol 25 mg BID) in context of cocaine use - Hold BP meds for SBP <115 as BP has been too well controlled - Renal has decreased lasix from IV to 160 mg PO BID in context of rising creatinine - Monitor vitals closely  - Appreciate cardiology and nephrology following  Acute on Chronic NYHA Class 4 Diastolic Heart Failure: Volume status is improving and patient is down 29 lbs since admission (199-->170). BNP initially elevated to 52226 (in the setting of CKD and malignant htn). Significant  LE edema. That said, weight on admission was down 19 lbs from last admission (may be somewhat intentional) and CXR showed improved aeration of the lung bases and decreased pleural effusions compared to last. Echo showed LVEF 50-55% and grade 2 diastolic dysfunction (unchanged from 2014). Home lasix 80 mg BID. Lipid panel WNL except LDL 67. - Renal has decreased lasix from IV to 160 mg PO BID in context of rising creatinine - Tele - Better plan for outpatient medication ascertainment  Acute on chronic CKD (stage IV): There is evidence of kidney injury progression; likely at least partly secondary to malignant hypertension. Baseline SCr~3.57, now 5.34-->5.97-->6.28-->6.28-->6.8. Patient has normocytic anemia (hemoglobin 8.8). Total protein ELP 6.0, IgG 697, IgA 1270(H), IgM serum 41(L), kappa free light chain 60.50(H) lamda free light chain 6.18(H), kappa, lamda light chain 9.79(H). PTH 177(H), phosphorus 6.5 (H), TSH 2.060. Patient will likely require dialysis. Vein mapping was completed on  Friday, left arm. Awaiting AV fistula placement. - SPEP and IFE pending - Started calcitriol - Daily renal panel - Appreciate nephrology management  Normocytic Anemia: Likely secondary to chronic renal disease. 9.5-->8.8. Basline ~9.7. No active bleeding. Iron studies: Iron 34, UIBC 205, TIBC 239, Ferritin 227, Folate 19.8. - Received IV iron  M Spike of ? Significance: M spike noted in 09/2013 - Rechecking free light chains IFE, pending  Chest Pain: Resolved. Has a history of angina; on home imdur and isordil. Troponins negative x3. Described some new burning with a sour taste in his mouth over the past 2 days. This has improved on famotidine. - D/c home imdur / isordil - Continue famotidine 20 mg PO BID  DMII: A1c 5.5% on 08/04/14 - ISS  Patient Running out of Medications: - Appreciate Care Management consult and help achieving Medicare with upcoming HD; patient receives [facility] from [facility]); many financial hardships; believes he may lose his job  Cocaine Abuse: - Patient ready to quit using cocaine; states that he is "not addicted" and "messed up" - Cessation counseling  Diet:  - Renal/carb mod - Fluid restriction 1200 ml    DVT Ppx: - [place] heparin  Dispo: Disposition is deferred at this time, awaiting improvement of current medical problems.  Anticipated discharge in approximately 0 or 4-5 day(s) depending on AV fistula placement scheduling. Ready for d/c from IM standpoint.  The patient does have a current PCP (Arnoldo Morale, MD) and does not need an [facility] hospital follow-up appointment after discharge.  The patient does have transportation limitations that hinder transportation to clinic appointments.  .Services Needed at time of discharge: Y = Yes, Blank = No PT:   OT:   RN:   Equipment:   Other:     LOS: 5 days   Drucilla Schmidt, MD 08/09/2014, 9:51 AM

## 2014-08-09 NOTE — Discharge Instructions (Signed)
You will have your A-V fistula surgery on Thursday, December 10th, 2015.  Eat a high-protein snack before bed on Wednesday night (08/11/14).  Then, at starting midnight on Wednesday (08/11/14), stop eating any food or drinking any liquids.   On Thursday morning, take your medications as usual with sips of water EXCEPT for your glipizide (do not take glipizide on Thursday).  Please report to [facility] Admitting Department at 6:30AM on Thursday (08/12/14) for the AV fistula placement (surgery).

## 2014-08-09 NOTE — Progress Notes (Signed)
Admit: 08/04/2014 LOS: 5  85M progressive CKD, nearing RRT but not uremic.  Hx/o HTN, cocaine use, abnormal sFLCs.    Subjective:  No new events No N/V, SOB   12/06 0701 - 12/07 0700 In: 1300 [P.O.:1300] Out: 3600 [Urine:3600]  Filed Weights   08/07/14 0532 08/08/14 0542 08/09/14 0448  Weight: 81.24 kg (179 lb 1.6 oz) 80 kg (176 lb 5.9 oz) 77.4 kg (170 lb 10.2 oz)    Scheduled Meds: . calcitRIOL  0.25 mcg Oral Daily  . calcium acetate  667 mg Oral TID WC  . carvedilol  25 mg Oral BID WC  . famotidine  20 mg Oral Daily  . furosemide  160 mg Oral BID  . heparin  5,000 Units Subcutaneous 3 times per day   Continuous Infusions:  PRN Meds:.acetaminophen **OR** acetaminophen, docusate sodium, guaifenesin  Current Labs: reviewed    Physical Exam:  Blood pressure 152/82, pulse 85, temperature 98.6 F (37 C), temperature source Oral, resp. rate 20, height 5\' 11"  (1.803 m), weight 77.4 kg (170 lb 10.2 oz), SpO2 97 %. NAD RRR CTAB S/nt/nd/nabs NCAT EOMI 1+ LEE No rashes/lesions  Assessment/Plan 1. Progressive CKD5, nonuremic 1. Neg renal US 09/2013 2. Follow daily, no indication for RRT currently 2. Need for Vascular Access 1. VVS following 2. Tentative for AVF this week 3. Does not need TDC at this time 3. Anemia 4. 2HPTH / Hyperphosphatemia 1. PTH 249 08/2014 2. Began calcitriol 0.4mcg daily 3. PhosLo 2 qAC 5. Cocaine Use 6. HTN 1. Admitted with HTN urgency 2. Chronically poorly controlled 3. Cont carvedilol and furosemide 160 BID PO 7. Abnormal SPEP / sFLCs 1. 09/2013 SPEP with M spike of 0.15 2. sFLC 08/2014 K/L 9.79 3. Obtain skeletal survey 4. Likely will need to see hematology  Pearson Grippe MD 08/09/2014, 2:42 PM   Recent Labs Lab 08/06/14 0345 08/07/14 0635 08/08/14 0434  NA 140 137 138  K 4.0 3.8 3.7  CL 102 101 100  CO2 23 20 23   GLUCOSE 97 162* 87  BUN 56* 55* 61*  CREATININE 6.28* 6.59* 6.80*  CALCIUM 8.6 8.6 8.6  PHOS 6.5* 6.8* 7.0*     Recent Labs Lab 08/04/14 1018 08/05/14 0423  WBC 11.0* 10.1  NEUTROABS 7.8*  --   HGB 9.5* 8.8*  HCT 29.4* 27.7*  MCV 91.9 92.0  PLT 470* 492*

## 2014-08-09 NOTE — Progress Notes (Signed)
Orders placed for pt to be discharged.  MD also placed order for a scan to be done.  MD called to verify if pt needed scan done before he could DC.  MD informed RN that it was ok to release pt and the scan could be done outpatient.  Pt given DC instructions and verified understanding.  Pt given bus pass and wc downstairs.

## 2014-08-10 ENCOUNTER — Other Ambulatory Visit: Payer: Self-pay

## 2014-08-10 LAB — PROTEIN ELECTROPHORESIS, SERUM
ALBUMIN ELP: 35.6 % — AB (ref 55.8–66.1)
ALPHA-1-GLOBULIN: 8 % — AB (ref 2.9–4.9)
Alpha-2-Globulin: 17.9 % — ABNORMAL HIGH (ref 7.1–11.8)
BETA GLOBULIN: 19.7 % — AB (ref 4.7–7.2)
Beta 2: 6.2 % (ref 3.2–6.5)
Gamma Globulin: 12.6 % (ref 11.1–18.8)
M-Spike, %: 0.45 g/dL
Total Protein ELP: 4.8 g/dL — ABNORMAL LOW (ref 6.0–8.3)

## 2014-08-10 LAB — UIFE/LIGHT CHAINS/TP QN, 24-HR UR
Albumin, U: DETECTED
Alpha 1, Urine: DETECTED — AB
Alpha 2, Urine: DETECTED — AB
Beta, Urine: DETECTED — AB
Gamma Globulin, Urine: DETECTED — AB
Total Protein, Urine: 237 mg/dL — ABNORMAL HIGH (ref 5–25)

## 2014-08-11 MED ORDER — CHLORHEXIDINE GLUCONATE CLOTH 2 % EX PADS: 6.0000 | MEDICATED_PAD | Freq: Once | CUTANEOUS | Status: DC

## 2014-08-11 MED ORDER — DEXTROSE 5 % IV SOLN
1.5000 g | INTRAVENOUS | Status: AC
  Administered 2014-08-12: 1.5 g via INTRAVENOUS
  Filled 2014-08-11: qty 1.5

## 2014-08-11 MED ORDER — SODIUM CHLORIDE 0.9 % IV SOLN
INTRAVENOUS | Status: DC
  Administered 2014-08-12: 07:00:00 via INTRAVENOUS

## 2014-08-11 NOTE — Progress Notes (Signed)
Anesthesia Chart Review:  Patient is a 53 year old male scheduled for creation of a LUE AVF on 08/12/14 by Dr. Kellie Simmering.  Case is posted for MAC anesthesia.    He was admitted to [facility] on 08/04/14 for SOB and severe HTN (240/176) with positive cocaine use.  Work-up revealed acute on chronic diastolic CHF with progressive CKD stage IV.Marland Kitchen He was just discharged home on 08/09/14. He is scheduled as a same day work-up.   Other history includes non-smoker, DM2, HTN, diastolic CHF, PVD, left hallux amputation, cocaine abuse, CKD stage IV, anemia.  He was seen by cardiologists Dr. Stanford Breed and Dr. Debara Pickett during his recent hospitalization (previously saw Dr. Verl Blalock on 01/20/14).  There was no mention of further cardiac testing (echo showed basal/inferoseptal hypokinesis) and his discharge summary is not yet filed. His last in-patient nephrology notes mentions that he may need hematology referral and/or skeletal survey for abnormal SPEP with M spike of 0.15 and abnormal sFLCs. PCP is listed as Dr. Arnoldo Morale.   Echo on 08/05/14: - Left ventricle: The cavity size was normal. There was mild concentric hypertrophy. Systolic function was normal. The estimated ejection fraction was in the range of 50% to 55%. D-shpaed septum consistent with elevated right ventricular pressure/volume overload. There is hypokinesis of the basal-midinferoseptal myocardium. Features are consistent with a pseudonormal left ventricular filling pattern, with concomitant abnormal relaxation and increased filling pressure (grade 2 diastolic dysfunction). - Mitral valve: There was mild regurgitation. - Left atrium: The atrium was moderately dilated. - Right atrium: The atrium was mildly dilated. - Pulmonary arteries: Systolic pressure was moderately increased. PA peak pressure: 48 mm Hg (S). - Pericardium, extracardiac: A small pericardial effusion was identified along the right ventricular free wall. There was no evidence of hemodynamic  compromise. Impressions: Compared to the prior study, there has been no significantinterval change (although 09/07/2013 echo could not exclude regional wall motion abnormalities).  EKG on 08/05/14: NSR, non-specific ST/T wave abnormality, prolonged QT. ST elevation/T wave inversion V1-4, consider injury.  1V CXR 08/04/14: The heart size and mediastinal contours are stable. There are chronic low lung volumes with associated bibasilar pulmonary opacities. Previously demonstrated bilateral pleural effusions have nearly completely resolved. There is no new airspace disease or pneumothorax. The osseous structures appear unchanged.  Labs from 08/05/14 - 08/08/14 noted.  Cr 6.80. Glucose 87. Phosphorus 7.0.  Albumin 2.0.  H/H 8.8/27.7. PLT count 492K. PTH 249. TSH 2.060. Troponin <0.3. HIV non-reactive. UDS positive for cocaine on 08/04/14.  Reviewed above with anesthesiologist Dr. Orene Desanctis.  Patient will be further evaluated by his assigned anesthesiologist on the day of surgery.  Hopefully he is being compliant with his BP medications and cocaine abstinence.  He will need an ISTAT on arrival.    [facility]/Anesthesiology Phone [phone] 08/11/2014 3:55 PM

## 2014-08-11 NOTE — Progress Notes (Signed)
Several unsuccessful attempts have been made to contact pt. Pt was recently discharged and is no longer an inpatient as indicated. Pre-op instructions were left on pt phone # [phone] according to pre-op checklist. Pt made aware to not take any diabetic medications the morning of procedure.

## 2014-08-12 ENCOUNTER — Encounter (HOSPITAL_COMMUNITY): Payer: Self-pay | Admitting: *Deleted

## 2014-08-12 ENCOUNTER — Encounter (HOSPITAL_COMMUNITY)
Admission: RE | Disposition: A | Discharge status: Home / Self Care | Payer: Self-pay | Source: Ambulatory Visit | Attending: Vascular Surgery

## 2014-08-12 ENCOUNTER — Ambulatory Visit (HOSPITAL_COMMUNITY)
Admission: RE | Admit: 2014-08-12 | Discharge: 2014-08-12 | Disposition: A | Discharge status: Home / Self Care | Payer: No Typology Code available for payment source | Source: Ambulatory Visit | Attending: Vascular Surgery | Admitting: Vascular Surgery

## 2014-08-12 ENCOUNTER — Ambulatory Visit (HOSPITAL_COMMUNITY): Payer: No Typology Code available for payment source | Admitting: Vascular Surgery

## 2014-08-12 ENCOUNTER — Telehealth: Payer: Self-pay | Admitting: Vascular Surgery

## 2014-08-12 ENCOUNTER — Other Ambulatory Visit: Payer: Self-pay | Admitting: *Deleted

## 2014-08-12 DIAGNOSIS — N186 End stage renal disease: Secondary | ICD-10-CM

## 2014-08-12 DIAGNOSIS — K219 Gastro-esophageal reflux disease without esophagitis: Secondary | ICD-10-CM | POA: Insufficient documentation

## 2014-08-12 DIAGNOSIS — E119 Type 2 diabetes mellitus without complications: Secondary | ICD-10-CM | POA: Insufficient documentation

## 2014-08-12 DIAGNOSIS — I10 Essential (primary) hypertension: Secondary | ICD-10-CM | POA: Diagnosis not present

## 2014-08-12 DIAGNOSIS — Z4931 Encounter for adequacy testing for hemodialysis: Secondary | ICD-10-CM

## 2014-08-12 DIAGNOSIS — N184 Chronic kidney disease, stage 4 (severe): Secondary | ICD-10-CM | POA: Diagnosis present

## 2014-08-12 HISTORY — DX: Gastro-esophageal reflux disease without esophagitis: K21.9

## 2014-08-12 HISTORY — PX: AV FISTULA PLACEMENT: SHX1204

## 2014-08-12 LAB — POCT I-STAT 4, (NA,K, GLUC, HGB,HCT)
Glucose, Bld: 65 mg/dL — ABNORMAL LOW (ref 70–99)
HCT: 29 % — ABNORMAL LOW (ref 39.0–52.0)
HEMOGLOBIN: 9.9 g/dL — AB (ref 13.0–17.0)
POTASSIUM: 3.2 meq/L — AB (ref 3.7–5.3)
Sodium: 140 mEq/L (ref 137–147)

## 2014-08-12 LAB — GLUCOSE, CAPILLARY
Glucose-Capillary: 125 mg/dL — ABNORMAL HIGH (ref 70–99)
Glucose-Capillary: 77 mg/dL (ref 70–99)
Glucose-Capillary: 72 mg/dL (ref 70–99)
Glucose-Capillary: 73 mg/dL (ref 70–99)

## 2014-08-12 SURGERY — ARTERIOVENOUS (AV) FISTULA CREATION
Anesthesia: Monitor Anesthesia Care | Site: Arm Lower | Laterality: Left

## 2014-08-12 MED ORDER — 0.9 % SODIUM CHLORIDE (POUR BTL) OPTIME
TOPICAL | Status: DC | PRN
  Administered 2014-08-12: 1000 mL

## 2014-08-12 MED ORDER — FENTANYL CITRATE 0.05 MG/ML IJ SOLN
INTRAMUSCULAR | Status: DC | PRN
  Administered 2014-08-12 (×3): 50 ug via INTRAVENOUS

## 2014-08-12 MED ORDER — FENTANYL CITRATE 0.05 MG/ML IJ SOLN
INTRAMUSCULAR | Status: AC
  Filled 2014-08-12: qty 5

## 2014-08-12 MED ORDER — OXYCODONE HCL 5 MG PO TABS
ORAL_TABLET | ORAL | Status: AC
  Filled 2014-08-12: qty 1

## 2014-08-12 MED ORDER — LIDOCAINE-EPINEPHRINE (PF) 1 %-1:200000 IJ SOLN
INTRAMUSCULAR | Status: AC
  Filled 2014-08-12: qty 10

## 2014-08-12 MED ORDER — SODIUM CHLORIDE 0.9 % IV SOLN
INTRAVENOUS | Status: DC | PRN
  Administered 2014-08-12: 09:00:00 via INTRAVENOUS

## 2014-08-12 MED ORDER — SODIUM CHLORIDE 0.9 % IR SOLN
Status: DC | PRN
  Administered 2014-08-12: 500 mL

## 2014-08-12 MED ORDER — OXYCODONE HCL 5 MG PO TABS
5.0000 mg | ORAL_TABLET | Freq: Once | ORAL | Status: AC | PRN
  Administered 2014-08-12: 5 mg via ORAL

## 2014-08-12 MED ORDER — DEXTROSE 50 % IV SOLN
25.0000 mL | Freq: Once | INTRAVENOUS | Status: AC
  Administered 2014-08-12: 25 mL via INTRAVENOUS
  Filled 2014-08-12: qty 50

## 2014-08-12 MED ORDER — CARVEDILOL 25 MG PO TABS
25.0000 mg | ORAL_TABLET | Freq: Once | ORAL | Status: AC
  Administered 2014-08-12: 25 mg via ORAL
  Filled 2014-08-12 (×2): qty 1
  Filled 2014-08-12: qty 2

## 2014-08-12 MED ORDER — OXYCODONE HCL 5 MG PO TABS: 5.0000 mg | ORAL_TABLET | Freq: Four times a day (QID) | ORAL | Status: DC | PRN

## 2014-08-12 MED ORDER — MIDAZOLAM HCL 2 MG/2ML IJ SOLN
INTRAMUSCULAR | Status: AC
  Filled 2014-08-12: qty 2

## 2014-08-12 MED ORDER — DEXTROSE 50 % IV SOLN
INTRAVENOUS | Status: AC
  Administered 2014-08-12: 25 mL via INTRAVENOUS
  Filled 2014-08-12: qty 50

## 2014-08-12 MED ORDER — FENTANYL CITRATE 0.05 MG/ML IJ SOLN
25.0000 ug | INTRAMUSCULAR | Status: DC | PRN
  Administered 2014-08-12: 50 ug via INTRAVENOUS

## 2014-08-12 MED ORDER — FENTANYL CITRATE 0.05 MG/ML IJ SOLN
INTRAMUSCULAR | Status: AC
  Filled 2014-08-12: qty 2

## 2014-08-12 MED ORDER — MIDAZOLAM HCL 5 MG/5ML IJ SOLN
INTRAMUSCULAR | Status: DC | PRN
  Administered 2014-08-12 (×2): 1 mg via INTRAVENOUS

## 2014-08-12 MED ORDER — OXYCODONE HCL 5 MG/5ML PO SOLN
5.0000 mg | Freq: Once | ORAL | Status: AC | PRN
Start: 2014-08-12 — End: 2014-08-12

## 2014-08-12 SURGICAL SUPPLY — 35 items
ARMBAND PINK RESTRICT EXTREMIT (MISCELLANEOUS) ×3 IMPLANT
BLADE SURG 10 STRL SS (BLADE) ×3 IMPLANT
CANISTER SUCTION 2500CC (MISCELLANEOUS) ×3 IMPLANT
CATH EMB 3FR 80CM (CATHETERS) ×3 IMPLANT
CLIP TI MEDIUM 6 (CLIP) ×3 IMPLANT
CLIP TI WIDE RED SMALL 6 (CLIP) ×3 IMPLANT
COVER MAYO STAND STRL (DRAPES) ×3 IMPLANT
COVER PROBE W GEL 5X96 (DRAPES) ×3 IMPLANT
COVER SURGICAL LIGHT HANDLE (MISCELLANEOUS) ×3 IMPLANT
DRAIN PENROSE 1/4X12 LTX STRL (WOUND CARE) ×3 IMPLANT
ELECT REM PT RETURN 9FT ADLT (ELECTROSURGICAL) ×3
ELECTRODE REM PT RTRN 9FT ADLT (ELECTROSURGICAL) ×1 IMPLANT
GEL ULTRASOUND 20GR AQUASONIC (MISCELLANEOUS) IMPLANT
GLOVE BIO SURGEON STRL SZ 6.5 (GLOVE) ×4 IMPLANT
GLOVE BIO SURGEONS STRL SZ 6.5 (GLOVE) ×2
GLOVE BIOGEL PI IND STRL 7.5 (GLOVE) ×1 IMPLANT
GLOVE BIOGEL PI INDICATOR 7.5 (GLOVE) ×2
GLOVE SS BIOGEL STRL SZ 7 (GLOVE) ×1 IMPLANT
GLOVE SUPERSENSE BIOGEL SZ 7 (GLOVE) ×2
GLOVE SURG SS PI 7.0 STRL IVOR (GLOVE) ×6 IMPLANT
GOWN STRL REUS W/ TWL LRG LVL3 (GOWN DISPOSABLE) ×3 IMPLANT
GOWN STRL REUS W/TWL LRG LVL3 (GOWN DISPOSABLE) ×6
KIT BASIN OR (CUSTOM PROCEDURE TRAY) ×3 IMPLANT
KIT ROOM TURNOVER OR (KITS) ×3 IMPLANT
LIQUID BAND (GAUZE/BANDAGES/DRESSINGS) ×3 IMPLANT
NS IRRIG 1000ML POUR BTL (IV SOLUTION) ×3 IMPLANT
PACK CV ACCESS (CUSTOM PROCEDURE TRAY) ×3 IMPLANT
PAD ARMBOARD 7.5X6 YLW CONV (MISCELLANEOUS) ×6 IMPLANT
PROBE PENCIL 8 MHZ STRL DISP (MISCELLANEOUS) IMPLANT
SUT PROLENE 6 0 BV (SUTURE) ×3 IMPLANT
SUT VIC AB 3-0 SH 27 (SUTURE) ×4
SUT VIC AB 3-0 SH 27X BRD (SUTURE) ×2 IMPLANT
SYRINGE 1CC SLIP TB (MISCELLANEOUS) ×3 IMPLANT
UNDERPAD 30X30 INCONTINENT (UNDERPADS AND DIAPERS) ×3 IMPLANT
WATER STERILE IRR 1000ML POUR (IV SOLUTION) ×3 IMPLANT

## 2014-08-12 NOTE — Op Note (Signed)
OPERATIVE REPORT  Date of Surgery: 08/12/2014  Surgeon: Tinnie Gens, MD  Assistant: Leontine Locket PA  Pre-op Diagnosis:  Stage IV chronic kidney disease  Post-op Diagnosis: Same  Procedure: Procedure(s): CREATION OF A BRACHIOCEPHALIC ARTERIOVENOUS (AV) FISTULA  LEFT ARM  Anesthesia: Mac  EBL: Minimal  Complications: None  Procedure Details: The patient was taken the operating are placed in supine position at which time the left upper extremity was imaged using the B-mode ultrasound-sono site. Cephalic vein appeared adequate for brachiocephalic fistula. After prepping and draping in routine sterile manner and infiltration of 1% Xylocaine with epinephrine transverse incision was made in the antecubital area antecubital vein dissected free. Cephalic vein was dissected free distally ligated transected gently dilated with heparinized saline. There was one sclerotic area about 45 cm from the transected and which could easily be dilated with the tip of the heparin saline syringe to 2-1/2 mm.   3 Fogarty catheter would traverse the entire vein up to the shoulder level and vein seemed superficial and of good caliber. There was one obvious branch in the mid upper arm which was marked for later ligation. Artery was dissected free and encircled with Vesseloops. It was a good vessel with an excellent pulse. He was occluded proximally and distally opened with 15 blade extended with Potts scissors. Vein was carefully measured spatulated and anastomosed inside with 6-0 Prolene. Vessel loops were released there was excellent pulse and palpable thrill in the proximal upper arm. There was also a #radial pulse with the fistula opening which improved with compression of the fistula. Adequate hemostasis was achieved area to short incision was made with 15 blade after infiltration forms and Xylocaine the mid upper arm and the competing branch was easily identified and ligated with silver clips. The wounds were  closed in layers with Vicryl subcuticular fashion with Dermabond patient taken to recovery in stable condition  Tinnie Gens, MD 08/12/2014 10:38 AM

## 2014-08-12 NOTE — Interval H&P Note (Signed)
History and Physical Interval Note:  07/06/1593 5:85 AM  [name]  has presented today for surgery, with the diagnosis of End Stage Renal Disease N18.6  The various methods of treatment have been discussed with the patient and family. After consideration of risks, benefits and other options for treatment, the patient has consented to  Procedure(s): ARTERIOVENOUS (AV) FISTULA CREATION-LEFT ARM (Left) as a surgical intervention .  The patient's history has been reviewed, patient examined, no change in status, stable for surgery.  I have reviewed the patient's chart and labs.  Questions were answered to the patient's satisfaction.     Tinnie Gens

## 2014-08-12 NOTE — Discharge Instructions (Signed)
° ° °  [birth year]76 [name] [id number] [birth year]  Surgeon(s): Mal Misty, MD  Procedure(s): CREATION OF A BRACHIOCEPHALIC ARTERIOVENOUS (AV) FISTULA  LEFT ARM   Do not stick fistula for 12 weeks   What to eat:  For your first meals, you should eat lightly; only small meals initially.  If you do not have nausea, you may eat larger meals.  Avoid spicy, greasy and heavy food.    General Anesthesia, Adult, Care After  Refer to this sheet in the next few weeks. These instructions provide you with information on caring for yourself after your procedure. Your health care provider may also give you more specific instructions. Your treatment has been planned according to current medical practices, but problems sometimes occur. Call your health care provider if you have any problems or questions after your procedure.  WHAT TO EXPECT AFTER THE PROCEDURE  After the procedure, it is typical to experience:  Sleepiness.  Nausea and vomiting. HOME CARE INSTRUCTIONS  For the first 24 hours after general anesthesia:  Have a responsible person with you.  Do not drive a car. If you are alone, do not take public transportation.  Do not drink alcohol.  Do not take medicine that has not been prescribed by your health care provider.  Do not sign important papers or make important decisions.  You may resume a normal diet and activities as directed by your health care provider.  Change bandages (dressings) as directed.  If you have questions or problems that seem related to general anesthesia, call the hospital and ask for the anesthetist or anesthesiologist on call. SEEK MEDICAL CARE IF:  You have nausea and vomiting that continue the day after anesthesia.  You develop a rash. SEEK IMMEDIATE MEDICAL CARE IF:  You have difficulty breathing.  You have chest pain.  You have any allergic problems. Document Released: 11/26/2000 Document Revised: 04/22/2013 Document Reviewed: 03/05/2013    [facility] Patient Information 2014 [place], [place].

## 2014-08-12 NOTE — Telephone Encounter (Addendum)
-----   Message from Mena Goes, RN sent at 08/12/2014 11:14 AM EST ----- Regarding: Schedule   ----- Message -----    From: Gabriel Earing, PA-C    Sent: 08/12/2014  10:29 AM      To: Vvs Charge Pool  S/p left BC AVF 08/12/14.  F/u with Dr. Kellie Simmering in 6 weeks with duplex.   thx   08/12/14: unable to reach pt, as he is still admitted, in recovery, mailed letter, dpm

## 2014-08-12 NOTE — Transfer of Care (Signed)
Immediate Anesthesia Transfer of Care Note  Patient: [name]  Procedure(s) Performed: Procedure(s): CREATION OF A BRACHIOCEPHALIC ARTERIOVENOUS (AV) FISTULA  LEFT ARM (Left)  Patient Location: PACU  Anesthesia Type:MAC  Level of Consciousness: awake, alert , oriented and sedated  Airway & Oxygen Therapy: Patient Spontanous Breathing and Patient connected to nasal cannula oxygen  Post-op Assessment: Report given to PACU RN, Post -op Vital signs reviewed and stable and Patient moving all extremities  Post vital signs: Reviewed and stable  Complications: No apparent anesthesia complications

## 2014-08-12 NOTE — Anesthesia Preprocedure Evaluation (Addendum)
Anesthesia Evaluation  Patient identified by MRN, date of birth, ID band Patient awake    Reviewed: Allergy & Precautions, H&P , NPO status , Patient's Chart, lab work & pertinent test results, reviewed documented beta blocker date and time   History of Anesthesia Complications Negative for: history of anesthetic complications  Airway Mallampati: II  TM Distance: >3 FB Neck ROM: Full    Dental  (+) Dental Advidsory Given   Pulmonary shortness of breath,          Cardiovascular hypertension, On Home Beta Blockers and Pt. on medications +CHF Rhythm:regular     Neuro/Psych negative neurological ROS  negative psych ROS   GI/Hepatic GERD-  Medicated and Controlled,  Endo/Other  diabetes, Poorly Controlled  Renal/GU ESRFRenal disease     Musculoskeletal   Abdominal   Peds  Hematology  (+) anemia ,   Anesthesia Other Findings   Reproductive/Obstetrics                            Anesthesia Physical Anesthesia Plan  ASA: III  Anesthesia Plan: MAC   Post-op Pain Management:    Induction:   Airway Management Planned: Simple Face Mask  Additional Equipment: None  Intra-op Plan:   Post-operative Plan:   Informed Consent: I have reviewed the patients History and Physical, chart, labs and discussed the procedure including the risks, benefits and alternatives for the proposed anesthesia with the patient or authorized representative who has indicated his/her understanding and acceptance.   Dental Advisory Given  Plan Discussed with: Anesthesiologist, CRNA and Surgeon  Anesthesia Plan Comments:        Anesthesia Quick Evaluation

## 2014-08-12 NOTE — H&P (View-Only) (Signed)
   Daily Progress Note  Assessment/Planning: chronic kidney disease stage V    Final vein mapping not available due problems with MERGE system  Regardless, appears pt is a candidate for possible L BC AVF vs staged BVT or BRVT   Will schedule for a L arm fistula later this week.  Waiting on scheduling to determine final date Risk, benefits, and alternatives to access surgery were discussed.  The patient is aware the risks include but are not limited to: bleeding, infection, steal syndrome, nerve damage, ischemic monomelic neuropathy, failure to mature, need for additional procedures, death and stroke.   The patient agrees to proceed forward with the procedure.  Subjective   No complaints  Objective Filed Vitals:   08/08/14 0542 08/08/14 1402 08/08/14 2012 08/09/14 0448  BP: 150/75 119/56 137/73 152/82  Pulse: 85 81 78 85  Temp: 98 F (36.7 C) 98.2 F (36.8 C) 98 F (36.7 C) 98.6 F (37 C)  TempSrc: Oral Oral Oral Oral  Resp: 20 20 20 20   Height:      Weight: 176 lb 5.9 oz (80 kg)   170 lb 10.2 oz (77.4 kg)  SpO2: 100% 100% 100% 97%    Intake/Output Summary (Last 24 hours) at 08/09/14 0918 Last data filed at 08/09/14 0806  Gross per 24 hour  Intake   1300 ml  Output   4125 ml  Net  -2825 ml    VASC  L arm palpable radial and brachial pulse, no IV in L arm  Laboratory CBC    Component Value Date/Time   WBC 10.1 08/05/2014 0423   HGB 8.8* 08/05/2014 0423   HCT 27.7* 08/05/2014 0423   PLT 492* 08/05/2014 0423    BMET    Component Value Date/Time   NA 138 08/08/2014 0434   K 3.7 08/08/2014 0434   CL 100 08/08/2014 0434   CO2 23 08/08/2014 0434   GLUCOSE 87 08/08/2014 0434   BUN 61* 08/08/2014 0434   CREATININE 6.80* 08/08/2014 0434   CALCIUM 8.6 08/08/2014 0434   GFRNONAA 8* 08/08/2014 0434   GFRAA 10* 08/08/2014 [place], MD [facility] Office: [phone] Pager: [phone]  08/09/2014, 9:18  AM

## 2014-08-12 NOTE — Anesthesia Procedure Notes (Signed)
Procedure Name: MAC Date/Time: 08/12/2014 9:30 AM Performed by: Scheryl Darter Pre-anesthesia Checklist: Patient identified, Emergency Drugs available, Suction available, Patient being monitored and Timeout performed Patient Re-evaluated:Patient Re-evaluated prior to inductionOxygen Delivery Method: Nasal cannula Intubation Type: IV induction Placement Confirmation: positive ETCO2

## 2014-08-13 ENCOUNTER — Encounter (HOSPITAL_COMMUNITY): Payer: Self-pay | Admitting: Vascular Surgery

## 2014-08-14 NOTE — Anesthesia Postprocedure Evaluation (Signed)
  Anesthesia Post-op Note  Patient: [name]  Procedure(s) Performed: Procedure(s): CREATION OF A BRACHIOCEPHALIC ARTERIOVENOUS (AV) FISTULA  LEFT ARM (Left)  Patient Location: PACU  Anesthesia Type:MAC  Level of Consciousness: awake  Airway and Oxygen Therapy: Patient Spontanous Breathing  Post-op Pain: moderate  Post-op Assessment: Post-op Vital signs reviewed, Patient's Cardiovascular Status Stable, Respiratory Function Stable, Patent Airway, No signs of Nausea or vomiting and Pain level controlled  Post-op Vital Signs: Reviewed and stable  Last Vitals:  Filed Vitals:   08/12/14 1239  BP: 143/83  Pulse:   Temp:   Resp: 14    Complications: No apparent anesthesia complications

## 2014-08-18 ENCOUNTER — Ambulatory Visit: Payer: No Typology Code available for payment source | Attending: Cardiology | Admitting: Cardiology

## 2014-08-18 ENCOUNTER — Encounter: Payer: Self-pay | Admitting: Cardiology

## 2014-08-18 ENCOUNTER — Telehealth: Payer: Self-pay

## 2014-08-18 VITALS — BP 175/90 | HR 75 | Temp 98.4°F | Resp 20 | Ht 71.0 in | Wt 188.0 lb

## 2014-08-18 DIAGNOSIS — IMO0002 Reserved for concepts with insufficient information to code with codable children: Secondary | ICD-10-CM

## 2014-08-18 DIAGNOSIS — I129 Hypertensive chronic kidney disease with stage 1 through stage 4 chronic kidney disease, or unspecified chronic kidney disease: Secondary | ICD-10-CM | POA: Insufficient documentation

## 2014-08-18 DIAGNOSIS — E1121 Type 2 diabetes mellitus with diabetic nephropathy: Secondary | ICD-10-CM | POA: Diagnosis not present

## 2014-08-18 DIAGNOSIS — I5032 Chronic diastolic (congestive) heart failure: Secondary | ICD-10-CM | POA: Insufficient documentation

## 2014-08-18 DIAGNOSIS — Z9114 Patient's other noncompliance with medication regimen: Secondary | ICD-10-CM | POA: Insufficient documentation

## 2014-08-18 DIAGNOSIS — Z599 Problem related to housing and economic circumstances, unspecified: Secondary | ICD-10-CM | POA: Diagnosis not present

## 2014-08-18 DIAGNOSIS — N184 Chronic kidney disease, stage 4 (severe): Secondary | ICD-10-CM | POA: Diagnosis not present

## 2014-08-18 DIAGNOSIS — E1165 Type 2 diabetes mellitus with hyperglycemia: Secondary | ICD-10-CM

## 2014-08-18 DIAGNOSIS — E1129 Type 2 diabetes mellitus with other diabetic kidney complication: Secondary | ICD-10-CM

## 2014-08-18 DIAGNOSIS — I10 Essential (primary) hypertension: Secondary | ICD-10-CM

## 2014-08-18 MED ORDER — HYDRALAZINE HCL 25 MG PO TABS: 25.0000 mg | ORAL_TABLET | Freq: Three times a day (TID) | ORAL | Status: DC

## 2014-08-18 NOTE — Assessment & Plan Note (Signed)
He is currently stable. Blood pressure is under good control. Will add hydralazine 25 mg 3 times a day along with his carvedilol. Once he is on dialysis, his blood pressure be much easier to control as I discussed with the patient today. I will see him back in 3 months.

## 2014-08-18 NOTE — Telephone Encounter (Signed)
Patient indicates his scale is broken, and he has been unable to weigh himself daily.  Patient unable to afford new scale due to financial hardship. Spoke with Dr. Verl Blalock who indicated it is imperative for patient to get scale for daily weights. Scale obtained from [employer]. Placed call to patient to inform of scale; unable to reach patient. Left voicemail requesting return call.

## 2014-08-18 NOTE — Assessment & Plan Note (Signed)
This is mostly from financial issues. We have and will do all we can to help him get his treatment plan at lowest cost. I explained to him in detail.

## 2014-08-18 NOTE — Assessment & Plan Note (Signed)
He is receiving proactive care with the placement of a left upper extremity AV fistula. Follow-up with Dr. Kellie Simmering and nephrology

## 2014-08-18 NOTE — Progress Notes (Signed)
[name] returns today for evaluation and management as chronic diastolic heart failure. Since last being evaluated, he's had a left upper extremity AV fistula placed. He is being followed by vascular surgery and nephrology.  He's been compliant with his medications. The threat of dialysis is really gotten his attention. His biggest reason for not being compliant has been finances.  Denies orthopnea, PND and has minimal edema. Blood pressures elevated today. He is only on carvedilol.  Exam very pleasant very talkative male in no acute distress. Neck shows minimal JVD upright carotid upstrokes are equal without bruits lungs are clear to auscultation cardiac exam reveals a normal S1-S2 with no gallop or murmur. Abdominal exam is non-protuberant with bowel sounds. Extremities reveal minimal 1+ or less pretibial edema. Pulses are intact. He has amputated left hallux. I do not hear a bruit over his AV fistula but there is a pulse. No sign of infection.

## 2014-08-18 NOTE — Patient Instructions (Addendum)
It was great seeing you again. You have been started on hydralazine 25 mg three times/day.  The script has been sent to [facility]. Begin weighing yourself daily again. Thanks so much!

## 2014-08-18 NOTE — Assessment & Plan Note (Signed)
Blood pressure not under optimal control today. Goal is less than 140/90. We'll add hydralazine 25 mg 3 times a day.

## 2014-08-18 NOTE — Progress Notes (Signed)
Patient with history of HTN, chronic diastolic heart failure, diabetes mellitus type II, CKD Stage IV Recently hospitalization for hypertensive urgency, congestive heart failure, CKD. S/p left AV fistula placement on 08/12/14.  Patient denies swelling, chest pain, shortness of breath, headaches, dizziness BP elevated today-190/98 HR 75 initally.  Checked again after about 5 minutes 175/90. Dr. Verl Blalock notified. Patient not weighing himself daily; he indicates he does not have a scale.

## 2014-08-19 ENCOUNTER — Telehealth: Payer: Self-pay | Admitting: Family Medicine

## 2014-08-19 NOTE — Telephone Encounter (Signed)
Pt. Is returning nurses call, pt would like for nurse to leave a voicemail if unable to reach patient.

## 2014-08-24 NOTE — Discharge Summary (Signed)
Name: [name] MRN: [id number] DOB: [birth year] 53 y.o. PCP: Arnoldo Morale, MD  Date of Admission: 08/04/2014  Date of Discharge: 08/09/2014 Attending Physician: Bartholomew Crews, MD  Discharge Diagnosis: Principal Problem:  Acute on chronic congestive heart failure Active Problems:  Anemia  Chronic diastolic HF (heart failure), NYHA class 4  Diabetes mellitus  CKD (chronic kidney disease), stage IV  Malignant hypertensive urgency  Non compliance w medication regimen  Cocaine abuse  Acute on chronic renal insufficiency  Discharge Medications: Medication List    STOP taking these medications       amLODipine 10 MG tablet  Commonly known as: NORVASC     hydrALAZINE 10 MG tablet  Commonly known as: APRESOLINE     isosorbide dinitrate 30 MG tablet  Commonly known as: ISORDIL     isosorbide mononitrate 30 MG 24 hr tablet  Commonly known as: IMDUR     lisinopril 5 MG tablet  Commonly known as: PRINIVIL,ZESTRIL     metoprolol 100 MG tablet  Commonly known as: LOPRESSOR     metoprolol succinate 25 MG 24 hr tablet  Commonly known as: TOPROL-XL      TAKE these medications       calcitRIOL 0.25 MCG capsule  Commonly known as: ROCALTROL  Take 1 capsule (0.25 mcg total) by mouth daily.     calcium acetate 667 MG capsule  Commonly known as: PHOSLO  Take 2 capsules (1,334 mg total) by mouth 3 (three) times daily with meals.     carvedilol 25 MG tablet  Commonly known as: COREG  Take 1 tablet (25 mg total) by mouth 2 (two) times daily with a meal.     famotidine 20 MG tablet  Commonly known as: PEPCID  Take 1 tablet (20 mg total) by mouth daily.     furosemide 80 MG tablet  Commonly known as: LASIX  Take 2 tablets (160 mg total) by mouth 2 (two) times daily.     glipiZIDE 10 MG tablet  Commonly known as: GLUCOTROL  Take 1 tablet (10 mg total) by mouth 2 (two)  times daily before a meal.        Disposition and follow-up:   Mr.[name] was discharged from [facility] in Stable condition.  At the hospital follow up visit please address:  1.  [name] was admitted with malignant hypertension, likely 2/2 medication noncompliance. He was put on a new anti-hypertensive medication regimen and met with SW for aid on affording medications.   Patient was found to be in Acute on Chronic NYHA Class 4 Diastolic Heart Failure. His weight came down nicely during the admission (199-->170). Lasix 80 mg BID was continued as outpatient; please monitor.  His CKD IV was assessed by nephrology, who initiated venous mapping and helped to schedule him for an AV fistula placement as an outpatient for imminent HD. Fistula placement scheduled for 08/11/14.  Patient received IV iron due to normocytic anemia.  Patient had M spike of ? significance on past admission. Repeated IFE: Monoclonal IgA kappa protein is present.  Polymerization pattern is noted. Monoclonal IgG kappa protein is present. May want to investigate after immediate problems resolve.  2.  Labs / imaging needed at time of follow-up: CBC, BMET   3.  Pending labs/ test needing follow-up: none  Follow-up Appointments: Follow-up Information    Follow up with Tinnie Gens, MD In 6 weeks.   Specialty:  Vascular Surgery   Why:  Office will call  you to arrange your appt (sent)   Contact information:   [address] [id number] [phone]       Discharge Instructions:    84/13/2440 [name] [id number] 16-Feb-1961  Surgeon(s): Mal Misty, MD  Procedure(s): CREATION OF A BRACHIOCEPHALIC ARTERIOVENOUS (AV) FISTULA  LEFT ARM   Do not stick fistula for 12 weeks   What to eat:  For your first meals, you should eat lightly; only small meals initially.  If you do not have nausea, you may eat larger meals.  Avoid spicy, greasy and  heavy food.    General Anesthesia, Adult, Care After  Refer to this sheet in the next few weeks. These instructions provide you with information on caring for yourself after your procedure. Your health care provider may also give you more specific instructions. Your treatment has been planned according to current medical practices, but problems sometimes occur. Call your health care provider if you have any problems or questions after your procedure.  WHAT TO EXPECT AFTER THE PROCEDURE  After the procedure, it is typical to experience:  Sleepiness.  Nausea and vomiting. HOME CARE INSTRUCTIONS  For the first 24 hours after general anesthesia:  Have a responsible person with you.  Do not drive a car. If you are alone, do not take public transportation.  Do not drink alcohol.  Do not take medicine that has not been prescribed by your health care provider.  Do not sign important papers or make important decisions.  You may resume a normal diet and activities as directed by your health care provider.  Change bandages (dressings) as directed.  If you have questions or problems that seem related to general anesthesia, call the hospital and ask for the anesthetist or anesthesiologist on call. SEEK MEDICAL CARE IF:  You have nausea and vomiting that continue the day after anesthesia.  You develop a rash. SEEK IMMEDIATE MEDICAL CARE IF:  You have difficulty breathing.  You have chest pain.  You have any allergic problems. Document Released: 11/26/2000 Document Revised: 04/22/2013 Document Reviewed: 03/05/2013  [facility] Patient Information 2014 [place], [place].    Discharge Instructions    Call MD for:  redness, tenderness, or signs of infection (pain, swelling, bleeding, redness, odor or green/yellow discharge around incision site)    Complete by:  As directed      Call MD for:  severe or increased pain, loss or decreased feeling  in affected limb(s)    Complete by:  As directed      Call MD  for:  temperature >100.5    Complete by:  As directed      Driving Restrictions    Complete by:  As directed   No driving for 24 hours and while taking pain medication.     Lifting restrictions    Complete by:  As directed   No lifting for 3 weeks     Resume previous diet    Complete by:  As directed      may wash over wound with mild soap and water    Complete by:  As directed            Consultations:  vascular surgery, renal, cardiology  Procedures Performed:  Dg Chest Port 1 View  08/04/2014   CLINICAL DATA:  Shortness of breath. Chest pain. Initial encounter.  EXAM: PORTABLE CHEST - 1 VIEW  COMPARISON:  01/12/2014 and 09/06/2013.  FINDINGS: 1013 hr. The heart size and mediastinal contours are stable. There are  chronic low lung volumes with associated bibasilar pulmonary opacities. Previously demonstrated bilateral pleural effusions have nearly completely resolved. There is no new airspace disease or pneumothorax. The osseous structures appear unchanged.  IMPRESSION: Overall improved aeration of the lung bases and decreased pleural effusions compared with prior examination. No acute findings demonstrated.   Electronically Signed   By: Camie Patience M.D.   On: 08/04/2014 10:57    2D Echo: 2D echo 08/05/14: Study Conclusions - Left ventricle: The cavity size was normal. There was mild concentric hypertrophy. Systolic function was normal. The estimated ejection fraction was in the range of 50% to 55%. D-shpaed septum consistent with elevated right ventricular pressure/volume overload. There is hypokinesis of the basal-midinferoseptal myocardium. Features are consistent with a pseudonormal left ventricular filling pattern, with concomitant abnormal relaxation and increased filling pressure (grade 2 diastolic dysfunction). - Mitral valve: There was mild regurgitation. - Left atrium: The atrium was moderately dilated. - Right atrium: The atrium was mildly dilated. -  Pulmonary arteries: Systolic pressure was moderately increased. PA peak pressure: 48 mm Hg (S). - Pericardium, extracardiac: A small pericardial effusion was identified along the right ventricular free wall. There was no evidence of hemodynamic compromise. Impressions: - Compared to the prior study, there has been no significant interval change.  Admission HPI: [name] is a 53 yo man with a history of chronic diastolic heart failure (NYHA class IV), hypertension, DMII and stage IV CKD who presented with hypertension and shortness of breath. He reports "feeling funny" for the past week. Finally, yesterday, he was with an RN who took his blood pressure and found it to be 220/140. Around this time, he noted a frontal headache, seeing black spots floating down and out in each of his eyes, and substernal chest pain that was faint but felt like needles sticking him. He also noted shortness of breath when he would walk. He typically takes multiple blood pressure medications, several of which he had run out of one week ago (amlodipine, hydralazine, metoprolol and lisinopril).   In the ED, his BP was elevated to 240/176; he was given IV hydralazine and IV lasix.    Hospital Course by problem list: Principal Problem:  Acute on chronic congestive heart failure Active Problems:  Anemia  Chronic diastolic HF (heart failure), NYHA class 4  Diabetes mellitus  CKD (chronic kidney disease), stage IV  Malignant hypertensive urgency  Non compliance w medication regimen  Cocaine abuse  Acute on chronic renal insufficiency   Mr. Mckenzie is a 53 yo man with chronic diastolic heart failure, chronic stage IV renal failure and hypertension who was admitted for evaluation of acutely high blood pressure, leg swelling and shortness of breath in the setting of missing his medications for a week. He had malignant hypertension on admission; he was also found to be cocaine +. His AV  fistula placement was scheduled for several days after discharge.  Malignant Hypertension: HTN was aggressively reduced on home meds. BP to 240/176 in the ED. ProBNP 52226. I-stat troponin 0.08. Episode was likely secondary to noncompliance with medications and cocaine use. Lasix was given IV, then titrated down when the patient's creatinine rose. Patient's home medications on admission were: amlodipine 10 mg daily, hydralazine 10 mg TID, lisinopril 5 mg, metoprolol XL 25 mg. Clonidine, hydralazine, amlodipine, imdur and isordil were stopped. Meroprolol was switched to carvedilol in the context of cocaine use.  Acute on Chronic NYHA Class 4 Diastolic Heart Failure: Volume status improved (down 29 lbs since admission (  199-->170)). BNP initially elevated to 522[address] (in the setting of CKD and malignant htn). Significant LE edema. That said, weight on admission was down 19 lbs from last admission (may be somewhat intentional) and CXR showed improved aeration of the lung bases and decreased pleural effusions compared to last. Echo showed LVEF 50-55% and grade 2 diastolic dysfunction (unchanged from 2014). At home, the patient takes lasix 80 mg BID. Lipid panel WNL except LDL 67.  Acute on chronic CKD (stage IV): There was evidence of kidney injury progression; likely at least partly secondary to malignant hypertension. Baseline SCr~3.57, now 5.34-->5.97-->6.28-->6.28-->6.8. Patient had normocytic anemia (hemoglobin 8.8). Total protein ELP 6.0, IgG 697, IgA 1270(H), IgM serum 41(L), kappa free light chain 60.50(H) lamda free light chain 6.18(H), kappa, lamda light chain 9.79(H). PTH 177(H), phosphorus 6.5 (H), TSH 2.060. Patient will likely require dialysis. Vein mapping was completed, left arm. AV fistula to be completed as outpatient. SPEP: Two restricted bands consistent with monoclonal proteins are present.  Suggest serum IFE to confirm, if clinically indicated. (Lab will hold  sample one week.)  The  monoclonal protein peaks account for 0.45 g/dL of the total  0.95 g/dL of protein in the beta-1 region and 0.14 g/dL of the total  0.60 g/dL of protein in the gamma region.   Normocytic Anemia: Likely secondary to chronic renal disease. 9.5-->8.8. Basline ~9.7. No active bleeding. Iron studies: Iron 34, UIBC 205, TIBC 239, Ferritin 227, Folate 19.8. Paitent received IV iron.  M Spike of ? Significance: M spike noted in 09/2013. Rechecked IFE: Monoclonal IgA kappa protein is present.  Polymerization pattern is noted.  Monoclonal IgG kappa protein is present.   Resolved Chest Pain: Has a history of angina; on home imdur and isordil. Troponins negative x3. Described some new burning with a sour taste in his mouth over the past 2 days. This improved on famotidine.  Discharge Vitals:   BP 143/83 mmHg  Pulse 73  Temp(Src) 97.4 F (36.3 C)  Resp 14  Ht 5' 11"  (1.803 m)  Wt 170 lb (77.111 kg)  BMI 23.72 kg/m2  SpO2 98%  Signed: Drucilla Schmidt, MD 08/24/2014, 11:01 AM    Services Ordered on Discharge: none Equipment Ordered on Discharge: none

## 2014-08-31 DIAGNOSIS — Z0279 Encounter for issue of other medical certificate: Secondary | ICD-10-CM

## 2014-09-03 DIAGNOSIS — F1410 Cocaine abuse, uncomplicated: Secondary | ICD-10-CM

## 2014-09-03 HISTORY — DX: Cocaine abuse, uncomplicated: F14.10

## 2014-09-14 ENCOUNTER — Encounter (HOSPITAL_COMMUNITY): Payer: Self-pay

## 2014-09-14 ENCOUNTER — Emergency Department (HOSPITAL_COMMUNITY): Payer: No Typology Code available for payment source

## 2014-09-14 ENCOUNTER — Emergency Department (HOSPITAL_COMMUNITY)
Admission: EM | Admit: 2014-09-14 | Discharge: 2014-09-14 | Disposition: A | Discharge status: Home / Self Care | Payer: No Typology Code available for payment source | Attending: Emergency Medicine | Admitting: Emergency Medicine

## 2014-09-14 DIAGNOSIS — R079 Chest pain, unspecified: Secondary | ICD-10-CM | POA: Insufficient documentation

## 2014-09-14 DIAGNOSIS — Z8719 Personal history of other diseases of the digestive system: Secondary | ICD-10-CM | POA: Insufficient documentation

## 2014-09-14 DIAGNOSIS — Z79899 Other long term (current) drug therapy: Secondary | ICD-10-CM | POA: Insufficient documentation

## 2014-09-14 DIAGNOSIS — E119 Type 2 diabetes mellitus without complications: Secondary | ICD-10-CM | POA: Insufficient documentation

## 2014-09-14 DIAGNOSIS — I5032 Chronic diastolic (congestive) heart failure: Secondary | ICD-10-CM

## 2014-09-14 DIAGNOSIS — R0789 Other chest pain: Secondary | ICD-10-CM

## 2014-09-14 DIAGNOSIS — I509 Heart failure, unspecified: Secondary | ICD-10-CM | POA: Insufficient documentation

## 2014-09-14 DIAGNOSIS — I5030 Unspecified diastolic (congestive) heart failure: Secondary | ICD-10-CM | POA: Insufficient documentation

## 2014-09-14 DIAGNOSIS — N184 Chronic kidney disease, stage 4 (severe): Secondary | ICD-10-CM | POA: Insufficient documentation

## 2014-09-14 DIAGNOSIS — Z862 Personal history of diseases of the blood and blood-forming organs and certain disorders involving the immune mechanism: Secondary | ICD-10-CM | POA: Insufficient documentation

## 2014-09-14 DIAGNOSIS — I129 Hypertensive chronic kidney disease with stage 1 through stage 4 chronic kidney disease, or unspecified chronic kidney disease: Secondary | ICD-10-CM | POA: Insufficient documentation

## 2014-09-14 LAB — CBC WITH DIFFERENTIAL/PLATELET
Basophils Absolute: 0 10*3/uL (ref 0.0–0.1)
Basophils Relative: 0 % (ref 0–1)
Eosinophils Absolute: 0.4 10*3/uL (ref 0.0–0.7)
Eosinophils Relative: 4 % (ref 0–5)
HEMATOCRIT: 29.9 % — AB (ref 39.0–52.0)
HEMOGLOBIN: 9.9 g/dL — AB (ref 13.0–17.0)
LYMPHS ABS: 1.2 10*3/uL (ref 0.7–4.0)
LYMPHS PCT: 13 % (ref 12–46)
MCH: 31.1 pg (ref 26.0–34.0)
MCHC: 33.1 g/dL (ref 30.0–36.0)
MCV: 94 fL (ref 78.0–100.0)
Monocytes Absolute: 0.7 10*3/uL (ref 0.1–1.0)
Monocytes Relative: 7 % (ref 3–12)
NEUTROS PCT: 76 % (ref 43–77)
Neutro Abs: 6.9 10*3/uL (ref 1.7–7.7)
Platelets: 339 10*3/uL (ref 150–400)
RBC: 3.18 MIL/uL — AB (ref 4.22–5.81)
RDW: 14.4 % (ref 11.5–15.5)
WBC: 9.2 10*3/uL (ref 4.0–10.5)

## 2014-09-14 LAB — COMPREHENSIVE METABOLIC PANEL
ALK PHOS: 75 U/L (ref 39–117)
ALT: 16 U/L (ref 0–53)
AST: 21 U/L (ref 0–37)
Albumin: 2.6 g/dL — ABNORMAL LOW (ref 3.5–5.2)
Anion gap: 11 (ref 5–15)
BUN: 71 mg/dL — ABNORMAL HIGH (ref 6–23)
CHLORIDE: 108 meq/L (ref 96–112)
CO2: 22 mmol/L (ref 19–32)
Calcium: 8.8 mg/dL (ref 8.4–10.5)
Creatinine, Ser: 6.01 mg/dL — ABNORMAL HIGH (ref 0.50–1.35)
GFR calc Af Amer: 11 mL/min — ABNORMAL LOW (ref >90)
GFR, EST NON AFRICAN AMERICAN: 10 mL/min — AB (ref >90)
GLUCOSE: 124 mg/dL — AB (ref 70–99)
POTASSIUM: 3.6 mmol/L (ref 3.5–5.1)
Sodium: 141 mmol/L (ref 135–145)
Total Bilirubin: 0.5 mg/dL (ref 0.3–1.2)
Total Protein: 6.5 g/dL (ref 6.0–8.3)

## 2014-09-14 LAB — RAPID URINE DRUG SCREEN, HOSP PERFORMED
AMPHETAMINES: NOT DETECTED
BENZODIAZEPINES: NOT DETECTED
Barbiturates: NOT DETECTED
COCAINE: NOT DETECTED
OPIATES: NOT DETECTED
TETRAHYDROCANNABINOL: NOT DETECTED

## 2014-09-14 LAB — TROPONIN I
TROPONIN I: 0.04 ng/mL — AB (ref <0.031)
Troponin I: 0.04 ng/mL — ABNORMAL HIGH (ref <0.031)

## 2014-09-14 LAB — BRAIN NATRIURETIC PEPTIDE: B NATRIURETIC PEPTIDE 5: 174.3 pg/mL — AB (ref 0.0–100.0)

## 2014-09-14 MED ORDER — FUROSEMIDE 10 MG/ML IJ SOLN
80.0000 mg | INTRAMUSCULAR | Status: AC
  Administered 2014-09-14: 80 mg via INTRAVENOUS
  Filled 2014-09-14: qty 8

## 2014-09-14 NOTE — Discharge Instructions (Signed)
Please follow up with your primary care physician in 1-2 days. If you do not have one please call the [facility] number listed above. Please follow up with Dr. Verl Blalock as scheduled. Please read all discharge instructions and return precautions.    Chest Pain (Nonspecific) It is often hard to give a specific diagnosis for the cause of chest pain. There is always a chance that your pain could be related to something serious, such as a heart attack or a blood clot in the lungs. You need to follow up with your health care provider for further evaluation. CAUSES   Heartburn.  Pneumonia or bronchitis.  Anxiety or stress.  Inflammation around your heart (pericarditis) or lung (pleuritis or pleurisy).  A blood clot in the lung.  A collapsed lung (pneumothorax). It can develop suddenly on its own (spontaneous pneumothorax) or from trauma to the chest.  Shingles infection (herpes zoster virus). The chest wall is composed of bones, muscles, and cartilage. Any of these can be the source of the pain.  The bones can be bruised by injury.  The muscles or cartilage can be strained by coughing or overwork.  The cartilage can be affected by inflammation and become sore (costochondritis). DIAGNOSIS  Lab tests or other studies may be needed to find the cause of your pain. Your health care provider may have you take a test called an ambulatory electrocardiogram (ECG). An ECG records your heartbeat patterns over a 24-hour period. You may also have other tests, such as:  Transthoracic echocardiogram (TTE). During echocardiography, sound waves are used to evaluate how blood flows through your heart.  Transesophageal echocardiogram (TEE).  Cardiac monitoring. This allows your health care provider to monitor your heart rate and rhythm in real time.  Holter monitor. This is a portable device that records your heartbeat and can help diagnose heart arrhythmias. It allows your health care  provider to track your heart activity for several days, if needed.  Stress tests by exercise or by giving medicine that makes the heart beat faster. TREATMENT   Treatment depends on what may be causing your chest pain. Treatment may include:  Acid blockers for heartburn.  Anti-inflammatory medicine.  Pain medicine for inflammatory conditions.  Antibiotics if an infection is present.  You may be advised to change lifestyle habits. This includes stopping smoking and avoiding alcohol, caffeine, and chocolate.  You may be advised to keep your head raised (elevated) when sleeping. This reduces the chance of acid going backward from your stomach into your esophagus. Most of the time, nonspecific chest pain will improve within 2-3 days with rest and mild pain medicine.  HOME CARE INSTRUCTIONS   If antibiotics were prescribed, take them as directed. Finish them even if you start to feel better.  For the next few days, avoid physical activities that bring on chest pain. Continue physical activities as directed.  Do not use any tobacco products, including cigarettes, chewing tobacco, or electronic cigarettes.  Avoid drinking alcohol.  Only take medicine as directed by your health care provider.  Follow your health care provider's suggestions for further testing if your chest pain does not go away.  Keep any follow-up appointments you made. If you do not go to an appointment, you could develop lasting (chronic) problems with pain. If there is any problem keeping an appointment, call to reschedule. SEEK MEDICAL CARE IF:   Your chest pain does not go away, even after treatment.  You have a rash with blisters  on your chest.  You have a fever. SEEK IMMEDIATE MEDICAL CARE IF:   You have increased chest pain or pain that spreads to your arm, neck, jaw, back, or abdomen.  You have shortness of breath.  You have an increasing cough, or you cough up blood.  You have severe back or  abdominal pain.  You feel nauseous or vomit.  You have severe weakness.  You faint.  You have chills. This is an emergency. Do not wait to see if the pain will go away. Get medical help at once. Call your local emergency services (911 in [place] Do not drive yourself to the hospital. MAKE SURE YOU:   Understand these instructions.  Will watch your condition.  Will get help right away if you are not doing well or get worse. Document Released: 05/30/2005 Document Revised: 08/25/2013 Document Reviewed: 03/25/2008 [facility] Patient Information 2015 [place], [place]. This information is not intended to replace advice given to you by your health care provider. Make sure you discuss any questions you have with your health care provider.

## 2014-09-14 NOTE — ED Notes (Signed)
Pt stated that he wants to talk to social work

## 2014-09-14 NOTE — ED Notes (Signed)
Per [facility]: Pt started experiencing chest pain around 1100 today. Pt was leaving one resource center and walking to another when the pt started experiencing chest pain. The resource center that the pt was at gave the pt 324 mg ASA. And was rating his pain 7/10. Pt reports increase in pain with respiration and palpation, and states that the pain comes and goes. When pt moved to truck pt reported no pain and stated that he was feeling dizzy. No nitro was given to the pt. The pt has not been nauseated or vomiting, and has not been experiencing any shortness of breath. NSR on monitor for [facility]. Pt had a shunt placed in left arm about 2 weeks ago, but pt is not currently receiving dialysis treatments.

## 2014-09-14 NOTE — Consult Note (Signed)
Patient ID: [name] MRN: [id number], DOB/AGE: 1961/01/19   Admit date: 09/14/2014   Primary Physician: Arnoldo Morale, MD Primary Cardiologist: Dr. Verl Blalock   Pt. Profile:  [name] is a 54 y.o. male with a history of ESRD, chronic diastolic CHF (EF 34-74%), HTN, DM and medical non compliance who presented to the [facility] ED today with shortness of breath.   He is followed by Dr. Verl Blalock in the health and wellness center for chronic diastolic CHF. He is also followed by nephrology who placed an AV fistula last month. He has not started HD yet.  He was walking to the pharmacy and back [facility] today because they did not properly fill his medications. He as stressed out. While at the [facility] he suddenly got dizzy and felt like something was stabbing him in the left heart. This was associated with SOB. This resolved with ASA. He is currently chest pain free. Every time he sits up he gets dizzy. He has been complaint with all his medications including his lasix 160mg  BID, hydralazine 25mg  TID and carvedilol 25mg  BID. EMS was called and he was taken to the ED.      Problem List  Past Medical History  Diagnosis Date  . Diabetes mellitus     New onset  . Hypertension   . CHF (congestive heart failure)   . Cocaine abuse   . Diastolic congestive heart failure   . Chronic renal disease, stage IV   . PVD (peripheral vascular disease)   . Anemia   . Shortness of breath dyspnea   . GERD (gastroesophageal reflux disease)     Past Surgical History  Procedure Laterality Date  . Leg surgery    . Knee surgery    . Amputation  09/06/2011    Procedure: AMPUTATION RAY;  Surgeon: Wylene Simmer, MD;  Location: [place];  Service: Orthopedics;  Laterality: Left;  Left Hallux Amputation  . Appendectomy    . Av fistula placement Left 08/12/2014    Procedure: CREATION OF A BRACHIOCEPHALIC ARTERIOVENOUS (AV) FISTULA  LEFT ARM;  Surgeon: Mal Misty, MD;  Location: [place];  Service: Vascular;   Laterality: Left;     Allergies  No Known Allergies   Home Medications  Prior to Admission medications   Medication Sig Start Date End Date Taking? Authorizing Provider  calcitRIOL (ROCALTROL) 0.25 MCG capsule Take 1 capsule (0.25 mcg total) by mouth daily. 08/09/14   Drucilla Schmidt, MD  calcium acetate (PHOSLO) 667 MG capsule Take 2 capsules (1,334 mg total) by mouth 3 (three) times daily with meals. Patient not taking: Reported on 08/18/2014 08/09/14   Drucilla Schmidt, MD  carvedilol (COREG) 25 MG tablet Take 1 tablet (25 mg total) by mouth 2 (two) times daily with a meal. 08/09/14   Drucilla Schmidt, MD  famotidine (PEPCID) 20 MG tablet Take 1 tablet (20 mg total) by mouth daily. 08/09/14   Drucilla Schmidt, MD  furosemide (LASIX) 80 MG tablet Take 2 tablets (160 mg total) by mouth 2 (two) times daily. 08/09/14   Drucilla Schmidt, MD  glipiZIDE (GLUCOTROL) 10 MG tablet Take 1 tablet (10 mg total) by mouth 2 (two) times daily before a meal. 09/10/13   Cherene Altes, MD  hydrALAZINE (APRESOLINE) 25 MG tablet Take 1 tablet (25 mg total) by mouth 3 (three) times daily. 08/18/14   Renella Cunas, MD  oxyCODONE (ROXICODONE) 5 MG immediate release tablet Take 1 tablet (5 mg total) by mouth every 6 (six)  hours as needed. 08/12/14   Gabriel Earing, PA-C    Family History  Family History  Problem Relation Age of Onset  . Adopted: Yes   No family status information on file.     Social History  History   Social History  . Marital Status: Legally Separated    Spouse Name: N/A    Number of Children: N/A  . Years of Education: N/A   Occupational History  . Not on file.   Social History Main Topics  . Smoking status: Never Smoker   . Smokeless tobacco: Never Used  . Alcohol Use: No  . Drug Use: Yes    Special: Cocaine     Comment: Pt reports last use of cocaine was "last month"   . Sexual Activity: Not on file   Other Topics Concern  . Not on file   Social History Narrative       All other systems reviewed and are otherwise negative except as noted above.  Physical Exam  Blood pressure 156/88, pulse 90, temperature 98 F (36.7 C), temperature source Oral, resp. rate 14, SpO2 100 %.  General: Pleasant, NAD. Psych: Normal affect. Neuro: Alert and oriented X 3. Moves all extremities spontaneously. HEENT: Normal  Neck: Supple without bruits or JVD. Lungs:  Resp regular and unlabored, CTA. Heart: RRR no s3, s4, or murmurs. Abdomen: Soft, non-tender, non-distended, BS + x 4.  Extremities: No clubbing, cyanosis or edema. DP/PT/Radials 2+ and equal bilaterally. Left AV fistula placed.   Labs   Recent Labs  09/14/14 1228  TROPONINI 0.04*   Lab Results  Component Value Date   WBC 9.2 09/14/2014   HGB 9.9* 09/14/2014   HCT 29.9* 09/14/2014   MCV 94.0 09/14/2014   PLT 339 09/14/2014    Recent Labs Lab 09/14/14 1228  NA 141  K 3.6  CL 108  CO2 22  BUN 71*  CREATININE 6.01*  CALCIUM 8.8  PROT 6.5  BILITOT 0.5  ALKPHOS 75  ALT 16  AST 21  GLUCOSE 124*   Lab Results  Component Value Date   CHOL 139 08/04/2014   HDL 56 08/04/2014   LDLCALC 67 08/04/2014   TRIG 82 08/04/2014   No results found for: DDIMER   Radiology/Studies  Dg Chest 2 View  09/14/2014   CLINICAL DATA:  Chest pain since 9 a.m.  EXAM: CHEST  2 VIEW  COMPARISON:  08/04/2014  FINDINGS: The heart size and mediastinal contours are within normal limits. Both lungs are clear. The visualized skeletal structures are unremarkable.  IMPRESSION: No active cardiopulmonary disease.   Electronically Signed   By: Kathreen Devoid   On: 09/14/2014 13:43    Study Date: 09/07/2013 LV EF: 50% -   55% Study Conclusions - Left ventricle: The cavity size was normal. There was   moderate concentric hypertrophy. Systolic function was low   normal. The estimated ejection fraction was in the range   of 50% to 55%. Regional wall motion abnormalities cannot   be excluded. There is diastolic  dysfunction with elevated   LV filling pressure. - Aortic valve: Sclerosis without stenosis. No   regurgitation. - Mitral valve: Mild regurgitation. - Left atrium: The atrium was mildly dilated (21 cm2). - Right ventricle: RV systolic pressure: 66YQ Hg (S, est).   Consistent with moderate pulmonary hypertension. - Right atrium: The atrium was normal in size. - Atrial septum: No defect or patent foramen ovale was   identified. - Inferior vena cava: The  vessel was normal in size; the   respirophasic diameter changes were in the normal range (=   50%); findings are consistent with normal central venous   pressure. - Pericardium, extracardiac: A trivial pericardial effusion   was identified. Features were not consistent with   tamponade physiology    ASSESSMENT AND PLAN  Patient is a 54 year old male past medical history significant for DM, HTN, CHF, cocaine abuse, anemia, medical non compliance and CKD presenting to the emergency department for acute onset shortness of breath and chest pain that began approximately 2 hours prior to arrival.  Acute on chronic diastolic CHF- EF 56-25% 02/3892. ECHO with diastolic dysfunction, mild MR, and mod pulm HTN -- BNP 174. CXR non acute. He does not appear volume overloaded on exam  Mildly elevated Troponin-  0.04. Likely non specific in the setting of ESRD  Acute on chronic kidney disease- approaching ESRD. He had 08/2014  -- May need to be seen by nephrology  HTN- relatively well controlled. 161/86.  -- Cont lasix 160mg  BID, hydralazine 25mg  TID and carvedilol 25mg  BID  DM- Continue home meds   CKD- stage IV- creat 6.01. AV fistula placed last month. Has not started dialysis yet.  Anemia of chronic disease- H/H 9.9/29.9 which is stable from previous  Dispo- he can likely go home today with close follow up in the health and wellness clinic. I will have this arranged.  He will see Dr. Verl Blalock tomorrow @ 2:45pm.   Signed, Eileen Stanford, PA-C 09/14/2014, 2:39 PM  Pager [phone]   Attending Note:   The patient was seen and examined.  Agree with assessment and plan as noted above.  Changes made to the above note as needed.  He has had a brief episode of CP associated with stress.  Troponin is 0.04.   Spoke to the ER and they will check another one to make sure it is stable It is not uncommon to have mild troponin elevations in the setting of such severe CKD.   He clinically appears stable and I think he can go home tonight.  He has an appt. To see Dr. Verl Blalock tomorrow.      Thayer Headings, Brooke Bonito., MD, [facility] 09/14/2014, 3:43 PM [address]  [place] Pager [phone]

## 2014-09-14 NOTE — ED Provider Notes (Signed)
CSN: [id number]     Arrival date & time 09/14/14  1207 History   First MD Initiated Contact with Patient 09/14/14 1217     Chief Complaint  Patient presents with  . Chest Pain     (Consider location/radiation/quality/duration/timing/severity/associated sxs/prior Treatment) HPI Comments: Patient is a 54 year old male past medical history significant for DM, HTN, CHF, cocaine abuse, CAD, anemia presenting to the emergency department for acute onset shortness of breath and chest pain that began approximately 2 hours prior to arrival. He states his symptoms were alleviated with 324 mg of aspirin given to him via EMS just prior to arrival. He is currently symptom-free. He states this feels like previous CHF exacerbations. States he has been taking his Lasix without difficulty. Last stress test December 2015. Patient with recent AV fistula placement, has not started Dialysis yet.   Patient is a 54 y.o. male presenting with chest pain.  Chest Pain Associated symptoms: shortness of breath     Past Medical History  Diagnosis Date  . Diabetes mellitus     New onset  . Hypertension   . CHF (congestive heart failure)   . Cocaine abuse   . Diastolic congestive heart failure   . Chronic renal disease, stage IV   . PVD (peripheral vascular disease)   . Anemia   . Shortness of breath dyspnea   . GERD (gastroesophageal reflux disease)    Past Surgical History  Procedure Laterality Date  . Leg surgery    . Knee surgery    . Amputation  09/06/2011    Procedure: AMPUTATION RAY;  Surgeon: Wylene Simmer, MD;  Location: [place];  Service: Orthopedics;  Laterality: Left;  Left Hallux Amputation  . Appendectomy    . Av fistula placement Left 08/12/2014    Procedure: CREATION OF A BRACHIOCEPHALIC ARTERIOVENOUS (AV) FISTULA  LEFT ARM;  Surgeon: Mal Misty, MD;  Location: [facility] OR;  Service: Vascular;  Laterality: Left;   Family History  Problem Relation Age of Onset  . Adopted: Yes   History  Substance  Use Topics  . Smoking status: Never Smoker   . Smokeless tobacco: Never Used  . Alcohol Use: No    Review of Systems  Respiratory: Positive for shortness of breath.   Cardiovascular: Positive for chest pain. Negative for leg swelling.  All other systems reviewed and are negative.     Allergies  Review of patient's allergies indicates no known allergies.  Home Medications   Prior to Admission medications   Medication Sig Start Date End Date Taking? Authorizing Provider  calcitRIOL (ROCALTROL) 0.25 MCG capsule Take 1 capsule (0.25 mcg total) by mouth daily. 08/09/14   Drucilla Schmidt, MD  calcium acetate (PHOSLO) 667 MG capsule Take 2 capsules (1,334 mg total) by mouth 3 (three) times daily with meals. Patient not taking: Reported on 08/18/2014 08/09/14   Drucilla Schmidt, MD  carvedilol (COREG) 25 MG tablet Take 1 tablet (25 mg total) by mouth 2 (two) times daily with a meal. 08/09/14   Drucilla Schmidt, MD  famotidine (PEPCID) 20 MG tablet Take 1 tablet (20 mg total) by mouth daily. 08/09/14   Drucilla Schmidt, MD  furosemide (LASIX) 80 MG tablet Take 2 tablets (160 mg total) by mouth 2 (two) times daily. 08/09/14   Drucilla Schmidt, MD  glipiZIDE (GLUCOTROL) 10 MG tablet Take 1 tablet (10 mg total) by mouth 2 (two) times daily before a meal. 09/10/13   Cherene Altes, MD  hydrALAZINE (APRESOLINE) 25 MG tablet Take  1 tablet (25 mg total) by mouth 3 (three) times daily. 08/18/14   Renella Cunas, MD  oxyCODONE (ROXICODONE) 5 MG immediate release tablet Take 1 tablet (5 mg total) by mouth every 6 (six) hours as needed. 08/12/14   Samantha J Rhyne, PA-C   BP 168/95 mmHg  Pulse 62  Temp(Src) 98 F (36.7 C) (Oral)  Resp 10  SpO2 100% Physical Exam  Constitutional: He is oriented to person, place, and time. He appears well-developed and well-nourished. No distress.  HENT:  Head: Normocephalic and atraumatic.  Right Ear: External ear normal.  Left Ear: External ear normal.  Nose: Nose normal.    Mouth/Throat: Oropharynx is clear and moist. No oropharyngeal exudate.  Eyes: Conjunctivae are normal.  Neck: Normal range of motion. Neck supple.  Cardiovascular: Normal rate, regular rhythm, normal heart sounds and intact distal pulses.   Pulmonary/Chest: Effort normal and breath sounds normal. No respiratory distress.  Abdominal: Soft. There is no tenderness.  Musculoskeletal: Normal range of motion. He exhibits edema (1+ BLE).       Arms: Neurological: He is alert and oriented to person, place, and time.  Skin: Skin is warm and dry. He is not diaphoretic.  Psychiatric: He has a normal mood and affect.  Nursing note and vitals reviewed.   ED Course  Procedures (including critical care time) Medications  furosemide (LASIX) injection 80 mg (80 mg Intravenous Given 09/14/14 1344)    Labs Review Labs Reviewed  TROPONIN I - Abnormal; Notable for the following:    Troponin I 0.04 (*)    All other components within normal limits  COMPREHENSIVE METABOLIC PANEL - Abnormal; Notable for the following:    Glucose, Bld 124 (*)    BUN 71 (*)    Creatinine, Ser 6.01 (*)    Albumin 2.6 (*)    GFR calc non Af Amer 10 (*)    GFR calc Af Amer 11 (*)    All other components within normal limits  CBC WITH DIFFERENTIAL - Abnormal; Notable for the following:    RBC 3.18 (*)    Hemoglobin 9.9 (*)    HCT 29.9 (*)    All other components within normal limits  BRAIN NATRIURETIC PEPTIDE - Abnormal; Notable for the following:    B Natriuretic Peptide 174.3 (*)    All other components within normal limits  URINE RAPID DRUG SCREEN (HOSP PERFORMED)  DRUGS OF ABUSE SCREEN W ALC, ROUTINE URINE  TROPONIN I    Imaging Review Dg Chest 2 View  09/14/2014   CLINICAL DATA:  Chest pain since 9 a.m.  EXAM: CHEST  2 VIEW  COMPARISON:  08/04/2014  FINDINGS: The heart size and mediastinal contours are within normal limits. Both lungs are clear. The visualized skeletal structures are unremarkable.  IMPRESSION:  No active cardiopulmonary disease.   Electronically Signed   By: Kathreen Devoid   On: 09/14/2014 13:43     EKG Interpretation   Date/Time:  Tuesday September 14 2014 12:12:24 EST Ventricular Rate:  73 PR Interval:  139 QRS Duration: 98 QT Interval:  419 QTC Calculation: 462 R Axis:   45 Text Interpretation:  Sinus rhythm Borderline T wave abnormalities ST  elev, probable normal early repol pattern Abnormal ekg Confirmed by Audie Pinto   MD, ROBERT ([id number]) on 09/14/2014 12:24:00 PM      Discussed patient with Dr. Cathie Olden who recommends repeating the troponin and if trending upwards he will admit patient to his service otherwise patient can be discharged  with outpatient follow up.  MDM   Final diagnoses:  CHF exacerbation  Chest pain, unspecified chest pain type    Filed Vitals:   09/14/14 1430  BP: 168/95  Pulse: 62  Temp:   Resp: 10   Afebrile, NAD, non-toxic appearing, AAOx4. I have reviewed nursing notes, vital signs, and all appropriate lab and imaging results for this patient. Pt does not meet criteria for CP protocol and a further evaluation is recommended. Pt has been re-evaluated prior to consult and VSS, NAD, heart RRR, pain 0/10, lungs CTAB.  BNP elevated, IV lasix ordered. No evidence of pulmonary edema or vascular congestion on CXR. Troponin elevated. Will order delta troponin to ensure no trending upwards. Will sign patient out to Carlisle Cater, PA-C pending delta troponin results. Patient d/w with Dr. Audie Pinto, agrees with plan.     Harlow Mares, PA-C 09/14/14 1623  Dot Lanes, MD 09/17/14 1230

## 2014-09-14 NOTE — ED Provider Notes (Signed)
5:05 PM Handoff from [place] PA-C at shift change.   Cardiology has seen. Awaiting 2nd marker, which is unchanged.    Plan: discharge if 2nd marker unchanged.   Patient has received instructions per previous provider and cardiology. Arrangements made for follow-up in office this week.   Carlisle Cater, PA-C 09/14/14 1706

## 2014-09-15 ENCOUNTER — Encounter: Payer: Self-pay | Admitting: Cardiology

## 2014-09-15 ENCOUNTER — Ambulatory Visit: Payer: No Typology Code available for payment source | Attending: Cardiology | Admitting: Cardiology

## 2014-09-15 VITALS — BP 187/97 | HR 84 | Temp 97.9°F | Resp 20 | Ht 71.0 in | Wt 191.0 lb

## 2014-09-15 DIAGNOSIS — R0602 Shortness of breath: Secondary | ICD-10-CM | POA: Insufficient documentation

## 2014-09-15 DIAGNOSIS — R079 Chest pain, unspecified: Secondary | ICD-10-CM | POA: Insufficient documentation

## 2014-09-15 DIAGNOSIS — I5032 Chronic diastolic (congestive) heart failure: Secondary | ICD-10-CM | POA: Insufficient documentation

## 2014-09-15 DIAGNOSIS — R42 Dizziness and giddiness: Secondary | ICD-10-CM | POA: Insufficient documentation

## 2014-09-15 DIAGNOSIS — I10 Essential (primary) hypertension: Secondary | ICD-10-CM | POA: Insufficient documentation

## 2014-09-15 DIAGNOSIS — R51 Headache: Secondary | ICD-10-CM | POA: Insufficient documentation

## 2014-09-15 LAB — URINE DRUGS OF ABUSE SCREEN W ALC, ROUTINE (REF LAB)
Amphetamine Screen, Ur: NEGATIVE
Barbiturate Quant, Ur: NEGATIVE
Benzodiazepines.: NEGATIVE
Cocaine Metabolites: NEGATIVE
Creatinine,U: 24.5 mg/dL
Marijuana Metabolite: NEGATIVE
Methadone: NEGATIVE
Opiate Screen, Urine: NEGATIVE
PHENCYCLIDINE (PCP): NEGATIVE
Propoxyphene: NEGATIVE

## 2014-09-15 MED ORDER — CALCITRIOL 0.25 MCG PO CAPS: 0.2500 ug | ORAL_CAPSULE | Freq: Every day | ORAL | Status: DC

## 2014-09-15 MED ORDER — FAMOTIDINE 20 MG PO TABS: 20.0000 mg | ORAL_TABLET | Freq: Every day | ORAL | Status: DC

## 2014-09-15 MED ORDER — CARVEDILOL 25 MG PO TABS: 25.0000 mg | ORAL_TABLET | Freq: Two times a day (BID) | ORAL | Status: DC

## 2014-09-15 MED ORDER — HYDRALAZINE HCL 50 MG PO TABS: 50.0000 mg | ORAL_TABLET | Freq: Three times a day (TID) | ORAL | Status: DC

## 2014-09-15 MED ORDER — FUROSEMIDE 80 MG PO TABS: 160.0000 mg | ORAL_TABLET | Freq: Two times a day (BID) | ORAL | Status: DC

## 2014-09-15 MED ORDER — GLIPIZIDE 10 MG PO TABS: 10.0000 mg | ORAL_TABLET | Freq: Two times a day (BID) | ORAL | Status: DC

## 2014-09-15 NOTE — Progress Notes (Signed)
[name] comes in today for follow-up of his hypertension and chronic diastolic heart failure. He was at the [facility] yesterday trying to get his medications refilled. Stressed out. He developed sharp stabbing left-sided chest pain and shortness of breath. EMS was called and took him to the emergency room at [facility].   Troponin was borderline positive but his creatinine is 6.0. Chest x-ray was clear. He did not appear to be in failure. There are no acute signs of cardiac ischemia. He was discharged home last night with follow-up today.  He has a lot of stress ongoing with his chronic medical disease. He needs renewal on his medications which cause him a lot of stress chest today. I will renew all of those today for 11 refills.  His blood pressure is still high despite me starting hydralazine 25 mg 3 times a day 2 weeks ago.  His exam today is unchanged. Specifically, there is no JVD, lungs are clear to auscultation percussion, and cardiac exam reveals normal S1-S2 without murmur. Extremities revealed no edema

## 2014-09-15 NOTE — Progress Notes (Signed)
Patient here for ED follow-up appointment. Patient presented to ED on 09/14/14 with left-sided chest pain. Patient denies chest pain, shortness of breath, dizziness, headache at this time.  Patient continues to have LE swelling. Patient out of calcitriol and famotidine and refills needed of lasix and glipizide. BP elevated 187/97. Dr. Verl Blalock notified. Patient weighing himself daily.

## 2014-09-15 NOTE — Patient Instructions (Signed)
It was great seeing you again. Your hydralazine has been increased to 50 mg three times daily. In addition, I have refilled the following medications for you:  Calcitriol, carvedilol, famotidine, furosemide, glipizide.  Scripts for these medications were sent to [facility] and [place]. Please follow-up with Dr. Verl Blalock in 6 months.

## 2014-09-15 NOTE — Assessment & Plan Note (Signed)
Not under good control. Increase hydralazine to 50 mg by mouth 3 times a day. Follow-up with nephrology. Once dialysis initiated, blood pressure will be much easier to control in my opinion.

## 2014-09-15 NOTE — Assessment & Plan Note (Signed)
Continue current medications including high-dose Lasix with chronic kidney disease class IV. Better blood pressure control needed and hydralazine increased today. Patient scheduled for an ultrasound of his AV fistula next Monday.

## 2014-09-21 ENCOUNTER — Ambulatory Visit (HOSPITAL_COMMUNITY)
Admit: 2014-09-21 | Discharge: 2014-09-21 | Disposition: A | Discharge status: Home / Self Care | Payer: No Typology Code available for payment source | Source: Ambulatory Visit | Attending: Vascular Surgery | Admitting: Vascular Surgery

## 2014-09-21 DIAGNOSIS — N186 End stage renal disease: Secondary | ICD-10-CM | POA: Insufficient documentation

## 2014-09-21 DIAGNOSIS — Z4931 Encounter for adequacy testing for hemodialysis: Secondary | ICD-10-CM | POA: Insufficient documentation

## 2014-09-27 ENCOUNTER — Encounter: Payer: Self-pay | Admitting: Vascular Surgery

## 2014-09-28 ENCOUNTER — Encounter: Payer: Self-pay | Admitting: Vascular Surgery

## 2014-09-28 ENCOUNTER — Ambulatory Visit (INDEPENDENT_AMBULATORY_CARE_PROVIDER_SITE_OTHER): Payer: Self-pay | Admitting: Vascular Surgery

## 2014-09-28 VITALS — BP 175/95 | HR 73 | Resp 16 | Ht 71.0 in | Wt 195.0 lb

## 2014-09-28 DIAGNOSIS — N184 Chronic kidney disease, stage 4 (severe): Secondary | ICD-10-CM

## 2014-09-28 NOTE — Progress Notes (Signed)
Subjective:     Patient ID: [name], male   DOB: 1961-07-08, 54 y.o.   MRN: [id number]  HPI this 54 year old male with stage IV chronic kidney disease had left brachial-cephalic AV fistula created by me on 08/12/2014. He denies any pain or numbness in the left hand. He returns for follow-up. Fistula duplex scan last week revealed the fistula to be occluded. He is right-handed. He is not on hemodialysis yet.  Past Medical History  Diagnosis Date  . Diabetes mellitus     New onset  . Hypertension   . CHF (congestive heart failure)   . Cocaine abuse   . Diastolic congestive heart failure   . Chronic renal disease, stage IV   . PVD (peripheral vascular disease)   . Anemia   . Shortness of breath dyspnea   . GERD (gastroesophageal reflux disease)     History  Substance Use Topics  . Smoking status: Never Smoker   . Smokeless tobacco: Never Used  . Alcohol Use: No    Family History  Problem Relation Age of Onset  . Adopted: Yes    No Known Allergies   Current outpatient prescriptions:  .  calcitRIOL (ROCALTROL) 0.25 MCG capsule, Take 1 capsule (0.25 mcg total) by mouth daily., Disp: 30 capsule, Rfl: 11 .  calcium acetate (PHOSLO) 667 MG capsule, Take 2 capsules (1,334 mg total) by mouth 3 (three) times daily with meals., Disp: 180 capsule, Rfl: 0 .  carvedilol (COREG) 25 MG tablet, Take 1 tablet (25 mg total) by mouth 2 (two) times daily with a meal., Disp: 60 tablet, Rfl: 11 .  famotidine (PEPCID) 20 MG tablet, Take 1 tablet (20 mg total) by mouth daily., Disp: 30 tablet, Rfl: 11 .  furosemide (LASIX) 80 MG tablet, Take 2 tablets (160 mg total) by mouth 2 (two) times daily., Disp: 120 tablet, Rfl: 11 .  glipiZIDE (GLUCOTROL) 10 MG tablet, Take 1 tablet (10 mg total) by mouth 2 (two) times daily before a meal., Disp: 60 tablet, Rfl: 11 .  hydrALAZINE (APRESOLINE) 50 MG tablet, Take 1 tablet (50 mg total) by mouth 3 (three) times daily., Disp: 90 tablet, Rfl: 11 .   oxyCODONE (ROXICODONE) 5 MG immediate release tablet, Take 1 tablet (5 mg total) by mouth every 6 (six) hours as needed. (Patient not taking: Reported on 09/15/2014), Disp: 20 tablet, Rfl: 0  BP 175/95 mmHg  Pulse 73  Resp 16  Ht 5\' 11"  (1.803 m)  Wt 195 lb (88.451 kg)  BMI 27.21 kg/m2  Body mass index is 27.21 kg/(m^2).         Review of Systems denies chest pain, dyspnea on exertion, PND, orthopnea.     Objective:   Physical Exam BP 175/95 mmHg  Pulse 73  Resp 16  Ht 5\' 11"  (1.803 m)  Wt 195 lb (88.451 kg)  BMI 27.21 kg/m2  Gen. well-developed well-nourished male no apparent stress alert and oriented 3 Lungs no rhonchi or wheezing Cardiovascular regular rhythm no murmurs Left upper extremity with no pulse or palpable thrill in the upper arm fistula. Right upper extremity imaged with ultrasound at the bedside and the upper arm cephalic vein appears satisfactory for distally creation. He has a clearly calcified radial artery at the wrist by palpation and do not think he is candidate for radial-cephalic AV fistula.     Assessment:     Chronic kidney disease stage IV-not on hemodialysis    Plan:     Plan right brachial-cephalic AV  fistula on Wednesday, February 3

## 2014-09-29 ENCOUNTER — Other Ambulatory Visit: Payer: Self-pay

## 2014-10-06 ENCOUNTER — Encounter (HOSPITAL_COMMUNITY): Payer: Self-pay | Admitting: *Deleted

## 2014-10-06 MED ORDER — DEXTROSE 5 % IV SOLN
1.5000 g | INTRAVENOUS | Status: AC
  Administered 2014-10-07: 1.5 g via INTRAVENOUS
  Filled 2014-10-06: qty 1.5

## 2014-10-06 MED ORDER — SODIUM CHLORIDE 0.9 % IV SOLN
INTRAVENOUS | Status: DC
  Administered 2014-10-07: 07:00:00 via INTRAVENOUS

## 2014-10-06 MED ORDER — CHLORHEXIDINE GLUCONATE 4 % EX LIQD
60.0000 mL | Freq: Once | CUTANEOUS | Status: DC
  Filled 2014-10-06: qty 60

## 2014-10-06 NOTE — Progress Notes (Signed)
Pt instructed not to take his diabetic medications in the AM prior to surgery.

## 2014-10-07 ENCOUNTER — Ambulatory Visit (HOSPITAL_COMMUNITY)
Admission: RE | Admit: 2014-10-07 | Discharge: 2014-10-07 | Disposition: A | Discharge status: Home / Self Care | Payer: Medicaid Other | Source: Ambulatory Visit | Attending: Vascular Surgery | Admitting: Vascular Surgery

## 2014-10-07 ENCOUNTER — Other Ambulatory Visit: Payer: Self-pay | Admitting: *Deleted

## 2014-10-07 ENCOUNTER — Ambulatory Visit (HOSPITAL_COMMUNITY): Payer: Medicaid Other | Admitting: Anesthesiology

## 2014-10-07 ENCOUNTER — Encounter (HOSPITAL_COMMUNITY)
Admission: RE | Disposition: A | Discharge status: Home / Self Care | Payer: Self-pay | Source: Ambulatory Visit | Attending: Vascular Surgery

## 2014-10-07 ENCOUNTER — Encounter (HOSPITAL_COMMUNITY): Payer: Self-pay | Admitting: Anesthesiology

## 2014-10-07 DIAGNOSIS — I739 Peripheral vascular disease, unspecified: Secondary | ICD-10-CM | POA: Insufficient documentation

## 2014-10-07 DIAGNOSIS — I120 Hypertensive chronic kidney disease with stage 5 chronic kidney disease or end stage renal disease: Secondary | ICD-10-CM | POA: Insufficient documentation

## 2014-10-07 DIAGNOSIS — K219 Gastro-esophageal reflux disease without esophagitis: Secondary | ICD-10-CM | POA: Diagnosis not present

## 2014-10-07 DIAGNOSIS — N186 End stage renal disease: Secondary | ICD-10-CM

## 2014-10-07 DIAGNOSIS — E119 Type 2 diabetes mellitus without complications: Secondary | ICD-10-CM | POA: Diagnosis not present

## 2014-10-07 DIAGNOSIS — N184 Chronic kidney disease, stage 4 (severe): Secondary | ICD-10-CM

## 2014-10-07 DIAGNOSIS — Z4931 Encounter for adequacy testing for hemodialysis: Secondary | ICD-10-CM

## 2014-10-07 DIAGNOSIS — I509 Heart failure, unspecified: Secondary | ICD-10-CM | POA: Insufficient documentation

## 2014-10-07 HISTORY — PX: AV FISTULA PLACEMENT: SHX1204

## 2014-10-07 LAB — POCT I-STAT 4, (NA,K, GLUC, HGB,HCT)
Glucose, Bld: 75 mg/dL (ref 70–99)
HCT: 34 % — ABNORMAL LOW (ref 39.0–52.0)
HEMOGLOBIN: 11.6 g/dL — AB (ref 13.0–17.0)
Potassium: 4 mmol/L (ref 3.5–5.1)
SODIUM: 140 mmol/L (ref 135–145)

## 2014-10-07 LAB — GLUCOSE, CAPILLARY
GLUCOSE-CAPILLARY: 77 mg/dL (ref 70–99)
GLUCOSE-CAPILLARY: 68 mg/dL — AB (ref 70–99)
Glucose-Capillary: 76 mg/dL (ref 70–99)
Glucose-Capillary: 107 mg/dL — ABNORMAL HIGH (ref 70–99)

## 2014-10-07 SURGERY — ARTERIOVENOUS (AV) FISTULA CREATION
Anesthesia: Monitor Anesthesia Care | Site: Arm Upper | Laterality: Right

## 2014-10-07 MED ORDER — FENTANYL CITRATE 0.05 MG/ML IJ SOLN
INTRAMUSCULAR | Status: AC
  Filled 2014-10-07: qty 2

## 2014-10-07 MED ORDER — OXYCODONE HCL 5 MG/5ML PO SOLN: 5.0000 mg | Freq: Once | ORAL | Status: AC | PRN

## 2014-10-07 MED ORDER — FENTANYL CITRATE 0.05 MG/ML IJ SOLN
25.0000 ug | INTRAMUSCULAR | Status: DC | PRN
  Administered 2014-10-07 (×2): 50 ug via INTRAVENOUS

## 2014-10-07 MED ORDER — OXYCODONE HCL 5 MG PO TABS
ORAL_TABLET | ORAL | Status: AC
  Filled 2014-10-07: qty 1

## 2014-10-07 MED ORDER — SODIUM CHLORIDE 0.9 % IV SOLN
INTRAVENOUS | Status: DC | PRN
  Administered 2014-10-07: 07:00:00 via INTRAVENOUS

## 2014-10-07 MED ORDER — ONDANSETRON HCL 4 MG/2ML IJ SOLN
INTRAMUSCULAR | Status: AC
  Filled 2014-10-07: qty 2

## 2014-10-07 MED ORDER — SODIUM CHLORIDE 0.9 % IR SOLN
Status: DC | PRN
  Administered 2014-10-07: 500 mL

## 2014-10-07 MED ORDER — PROPOFOL 10 MG/ML IV BOLUS
INTRAVENOUS | Status: DC | PRN
  Administered 2014-10-07 (×2): 20 mg via INTRAVENOUS

## 2014-10-07 MED ORDER — LIDOCAINE-EPINEPHRINE (PF) 1 %-1:200000 IJ SOLN
INTRAMUSCULAR | Status: DC | PRN
  Administered 2014-10-07: 18 mL

## 2014-10-07 MED ORDER — MIDAZOLAM HCL 5 MG/5ML IJ SOLN
INTRAMUSCULAR | Status: DC | PRN
  Administered 2014-10-07: 2 mg via INTRAVENOUS

## 2014-10-07 MED ORDER — 0.9 % SODIUM CHLORIDE (POUR BTL) OPTIME
TOPICAL | Status: DC | PRN
  Administered 2014-10-07: 1000 mL

## 2014-10-07 MED ORDER — OXYCODONE HCL 5 MG PO TABS
5.0000 mg | ORAL_TABLET | Freq: Once | ORAL | Status: AC | PRN
  Administered 2014-10-07: 5 mg via ORAL

## 2014-10-07 MED ORDER — PHENYLEPHRINE HCL 10 MG/ML IJ SOLN
INTRAMUSCULAR | Status: DC | PRN
  Administered 2014-10-07: 80 ug via INTRAVENOUS
  Administered 2014-10-07: 40 ug via INTRAVENOUS

## 2014-10-07 MED ORDER — ONDANSETRON HCL 4 MG/2ML IJ SOLN
4.0000 mg | Freq: Four times a day (QID) | INTRAMUSCULAR | Status: AC | PRN
  Administered 2014-10-07: 4 mg via INTRAVENOUS

## 2014-10-07 MED ORDER — ONDANSETRON HCL 4 MG/2ML IJ SOLN
INTRAMUSCULAR | Status: DC | PRN
  Administered 2014-10-07: 4 mg via INTRAVENOUS

## 2014-10-07 MED ORDER — CARVEDILOL 25 MG PO TABS
25.0000 mg | ORAL_TABLET | Freq: Once | ORAL | Status: AC
  Administered 2014-10-07: 25 mg via ORAL
  Filled 2014-10-07: qty 1
  Filled 2014-10-07: qty 2

## 2014-10-07 MED ORDER — OXYCODONE-ACETAMINOPHEN 5-500 MG PO CAPS: 1.0000 | ORAL_CAPSULE | ORAL | Status: DC | PRN

## 2014-10-07 MED ORDER — FENTANYL CITRATE 0.05 MG/ML IJ SOLN
INTRAMUSCULAR | Status: DC | PRN
  Administered 2014-10-07 (×3): 50 ug via INTRAVENOUS

## 2014-10-07 MED ORDER — LIDOCAINE-EPINEPHRINE (PF) 1 %-1:200000 IJ SOLN
INTRAMUSCULAR | Status: AC
  Filled 2014-10-07: qty 10

## 2014-10-07 SURGICAL SUPPLY — 31 items
ARMBAND PINK RESTRICT EXTREMIT (MISCELLANEOUS) ×3 IMPLANT
BLADE SURG 10 STRL SS (BLADE) ×3 IMPLANT
CANISTER SUCTION 2500CC (MISCELLANEOUS) ×3 IMPLANT
CLIP TI MEDIUM 6 (CLIP) ×3 IMPLANT
CLIP TI WIDE RED SMALL 6 (CLIP) ×3 IMPLANT
COVER PROBE W GEL 5X96 (DRAPES) ×3 IMPLANT
COVER SURGICAL LIGHT HANDLE (MISCELLANEOUS) ×3 IMPLANT
DRAIN PENROSE 1/4X12 LTX STRL (WOUND CARE) ×3 IMPLANT
ELECT REM PT RETURN 9FT ADLT (ELECTROSURGICAL) ×3
ELECTRODE REM PT RTRN 9FT ADLT (ELECTROSURGICAL) ×1 IMPLANT
GEL ULTRASOUND 20GR AQUASONIC (MISCELLANEOUS) IMPLANT
GLOVE BIO SURGEON STRL SZ 6.5 (GLOVE) ×4 IMPLANT
GLOVE BIO SURGEONS STRL SZ 6.5 (GLOVE) ×2
GLOVE BIOGEL PI IND STRL 6.5 (GLOVE) ×2 IMPLANT
GLOVE BIOGEL PI INDICATOR 6.5 (GLOVE) ×4
GLOVE SS BIOGEL STRL SZ 7 (GLOVE) ×1 IMPLANT
GLOVE SUPERSENSE BIOGEL SZ 7 (GLOVE) ×2
GOWN STRL REUS W/ TWL LRG LVL3 (GOWN DISPOSABLE) ×3 IMPLANT
GOWN STRL REUS W/TWL LRG LVL3 (GOWN DISPOSABLE) ×6
KIT BASIN OR (CUSTOM PROCEDURE TRAY) ×3 IMPLANT
KIT ROOM TURNOVER OR (KITS) ×3 IMPLANT
LIQUID BAND (GAUZE/BANDAGES/DRESSINGS) ×3 IMPLANT
NS IRRIG 1000ML POUR BTL (IV SOLUTION) ×3 IMPLANT
PACK CV ACCESS (CUSTOM PROCEDURE TRAY) ×3 IMPLANT
PAD ARMBOARD 7.5X6 YLW CONV (MISCELLANEOUS) ×6 IMPLANT
PROBE PENCIL 8 MHZ STRL DISP (MISCELLANEOUS) ×3 IMPLANT
SUT PROLENE 6 0 BV (SUTURE) ×6 IMPLANT
SUT VIC AB 3-0 SH 27 (SUTURE) ×2
SUT VIC AB 3-0 SH 27X BRD (SUTURE) ×1 IMPLANT
UNDERPAD 30X30 INCONTINENT (UNDERPADS AND DIAPERS) ×3 IMPLANT
WATER STERILE IRR 1000ML POUR (IV SOLUTION) ×3 IMPLANT

## 2014-10-07 NOTE — Anesthesia Postprocedure Evaluation (Signed)
Anesthesia Post Note  Patient: [name]  Procedure(s) Performed: Procedure(s) (LRB): Creation Right Arm Arteriovenous fistula (Right)  Anesthesia type: MAC  Patient location: PACU  Post pain: Pain level controlled and Adequate analgesia  Post assessment: Post-op Vital signs reviewed, Patient's Cardiovascular Status Stable and Respiratory Function Stable  Last Vitals:  Filed Vitals:   10/07/14 1130  BP:   Pulse: 65  Temp:   Resp: 9    Post vital signs: Reviewed and stable  Level of consciousness: awake, alert  and oriented  Complications: No apparent anesthesia complications

## 2014-10-07 NOTE — H&P (View-Only) (Signed)
Subjective:     Patient ID: [name], male   DOB: 10/23/1960, 54 y.o.   MRN: [id number]  HPI this 54 year old male with stage IV chronic kidney disease had left brachial-cephalic AV fistula created by me on 08/12/2014. He denies any pain or numbness in the left hand. He returns for follow-up. Fistula duplex scan last week revealed the fistula to be occluded. He is right-handed. He is not on hemodialysis yet.  Past Medical History  Diagnosis Date  . Diabetes mellitus     New onset  . Hypertension   . CHF (congestive heart failure)   . Cocaine abuse   . Diastolic congestive heart failure   . Chronic renal disease, stage IV   . PVD (peripheral vascular disease)   . Anemia   . Shortness of breath dyspnea   . GERD (gastroesophageal reflux disease)     History  Substance Use Topics  . Smoking status: Never Smoker   . Smokeless tobacco: Never Used  . Alcohol Use: No    Family History  Problem Relation Age of Onset  . Adopted: Yes    No Known Allergies   Current outpatient prescriptions:  .  calcitRIOL (ROCALTROL) 0.25 MCG capsule, Take 1 capsule (0.25 mcg total) by mouth daily., Disp: 30 capsule, Rfl: 11 .  calcium acetate (PHOSLO) 667 MG capsule, Take 2 capsules (1,334 mg total) by mouth 3 (three) times daily with meals., Disp: 180 capsule, Rfl: 0 .  carvedilol (COREG) 25 MG tablet, Take 1 tablet (25 mg total) by mouth 2 (two) times daily with a meal., Disp: 60 tablet, Rfl: 11 .  famotidine (PEPCID) 20 MG tablet, Take 1 tablet (20 mg total) by mouth daily., Disp: 30 tablet, Rfl: 11 .  furosemide (LASIX) 80 MG tablet, Take 2 tablets (160 mg total) by mouth 2 (two) times daily., Disp: 120 tablet, Rfl: 11 .  glipiZIDE (GLUCOTROL) 10 MG tablet, Take 1 tablet (10 mg total) by mouth 2 (two) times daily before a meal., Disp: 60 tablet, Rfl: 11 .  hydrALAZINE (APRESOLINE) 50 MG tablet, Take 1 tablet (50 mg total) by mouth 3 (three) times daily., Disp: 90 tablet, Rfl: 11 .   oxyCODONE (ROXICODONE) 5 MG immediate release tablet, Take 1 tablet (5 mg total) by mouth every 6 (six) hours as needed. (Patient not taking: Reported on 09/15/2014), Disp: 20 tablet, Rfl: 0  BP 175/95 mmHg  Pulse 73  Resp 16  Ht 5\' 11"  (1.803 m)  Wt 195 lb (88.451 kg)  BMI 27.21 kg/m2  Body mass index is 27.21 kg/(m^2).         Review of Systems denies chest pain, dyspnea on exertion, PND, orthopnea.     Objective:   Physical Exam BP 175/95 mmHg  Pulse 73  Resp 16  Ht 5\' 11"  (1.803 m)  Wt 195 lb (88.451 kg)  BMI 27.21 kg/m2  Gen. well-developed well-nourished male no apparent stress alert and oriented 3 Lungs no rhonchi or wheezing Cardiovascular regular rhythm no murmurs Left upper extremity with no pulse or palpable thrill in the upper arm fistula. Right upper extremity imaged with ultrasound at the bedside and the upper arm cephalic vein appears satisfactory for distally creation. He has a clearly calcified radial artery at the wrist by palpation and do not think he is candidate for radial-cephalic AV fistula.     Assessment:     Chronic kidney disease stage IV-not on hemodialysis    Plan:     Plan right brachial-cephalic AV  fistula on Wednesday, February 3

## 2014-10-07 NOTE — Interval H&P Note (Signed)
History and Physical Interval Note:  05/08/3275 1:47 AM  [name]  has presented today for surgery, with the diagnosis of End Stage Renal Disease N18.6  The various methods of treatment have been discussed with the patient and family. After consideration of risks, benefits and other options for treatment, the patient has consented to  Procedure(s): ARTERIOVENOUS (AV) FISTULA CREATION (Right) as a surgical intervention .  The patient's history has been reviewed, patient examined, no change in status, stable for surgery.  I have reviewed the patient's chart and labs.  Questions were answered to the patient's satisfaction.     Tinnie Gens

## 2014-10-07 NOTE — Anesthesia Preprocedure Evaluation (Signed)
Anesthesia Evaluation  Patient identified by MRN, date of birth, ID band Patient awake    Reviewed: Allergy & Precautions, NPO status , Patient's Chart, lab work & pertinent test results  Airway Mallampati: II   Neck ROM: full    Dental   Pulmonary shortness of breath,          Cardiovascular hypertension, + Peripheral Vascular Disease and +CHF     Neuro/Psych    GI/Hepatic GERD-  ,(+)     substance abuse  cocaine use,   Endo/Other  diabetes, Type 2  Renal/GU ESRF and DialysisRenal disease     Musculoskeletal   Abdominal   Peds  Hematology   Anesthesia Other Findings   Reproductive/Obstetrics                             Anesthesia Physical Anesthesia Plan  ASA: III  Anesthesia Plan: General   Post-op Pain Management:    Induction: Intravenous  Airway Management Planned: LMA  Additional Equipment:   Intra-op Plan:   Post-operative Plan:   Informed Consent: I have reviewed the patients History and Physical, chart, labs and discussed the procedure including the risks, benefits and alternatives for the proposed anesthesia with the patient or authorized representative who has indicated his/her understanding and acceptance.     Plan Discussed with: CRNA, Anesthesiologist and Surgeon  Anesthesia Plan Comments:         Anesthesia Quick Evaluation

## 2014-10-07 NOTE — Transfer of Care (Signed)
Immediate Anesthesia Transfer of Care Note  Patient: [name]  Procedure(s) Performed: Procedure(s): Creation Right Arm Arteriovenous fistula (Right)  Patient Location: PACU  Anesthesia Type:MAC  Level of Consciousness: awake, alert , oriented and sedated  Airway & Oxygen Therapy: Patient Spontanous Breathing and Patient connected to nasal cannula oxygen  Post-op Assessment: Report given to RN, Post -op Vital signs reviewed and stable and Patient moving all extremities  Post vital signs: Reviewed and stable  Last Vitals:  Filed Vitals:   10/07/14 0923  BP:   Pulse: 67  Temp: 36.4 C  Resp: 12    Complications: No apparent anesthesia complications

## 2014-10-07 NOTE — Op Note (Signed)
OPERATIVE REPORT  Date of Surgery: 10/07/2014  Surgeon: Tinnie Gens, MD  Assistant: Nurse  Pre-op Diagnosis: End Stage Renal Disease N18.6  Post-op Diagnosis: end stage renal disease  Procedure: Procedure(s): Creation Right Arm Arteriovenous fistula-brachial-cephalic  Anesthesia Mac  EBL: Minimal  Complications: None  The patient was taken the operating are placed in supine position at which time the right upper extremity was prepped Betadine scrub and solution draped in routine sterile manner. After infiltration forms and Xylocaine with epinephrine transverse incision was made in the cubital area. Cephalic vein was dissected free its branches ligated with 3-0 silk ties and divided it was an excellent vein being at least 3 and half millimeters in size. Brachial artery was exposed beneath the fashion circled with Vesseloops. It was a normal-appearing vessel with good pulse. Vein was ligated distally transected gently dilated with heparinized saline. Artery was an occluded proximally and distally with Vesseloops longitudinal opening made 15 blade extended with Potts scissors it was good inflow. Vein was carefully measured spatulated and anastomosed end to side with 6-0 Prolene. Clamps were then released there was a slight kink at the anastomotic area and I decided to shortness slightly to eliminate the kink. Therefore the artery was reoccluded and the anastomosis taken down suture removed in about 1 cm of the vein was excised it was reanastomosed the brachial artery with 6-0 Prolene Vesseloops released nose excellent pulse and thrill with complete elimination of the kink. This was then imaged with the ultrasound looking for competing branches and none were noted. There was good radial arterial flow distally with the fistula open which improved slightly with compression of the fistula. Adequate hemostasis was achieved. No heparin was given. Wounds closed in layers with Vicryl in subcuticular fashion  with Dermabond patient taken to recovery room in stable condition     Tinnie Gens, MD 10/07/2014 9:21 AM

## 2014-10-07 NOTE — Addendum Note (Signed)
Addendum  created 10/07/14 1443 by Scheryl Darter, CRNA   Modules edited: Anesthesia Events, Narrator   Narrator:  Narrator: Event Log Edited

## 2014-10-07 NOTE — Discharge Instructions (Signed)
°What to eat: ° °For your first meals, you should eat lightly; only small meals initially.  If you do not have nausea, you may eat larger meals.  Avoid spicy, greasy and heavy food.   ° °General Anesthesia, Adult, Care After  °Refer to this sheet in the next few weeks. These instructions provide you with information on caring for yourself after your procedure. Your health care provider may also give you more specific instructions. Your treatment has been planned according to current medical practices, but problems sometimes occur. Call your health care provider if you have any problems or questions after your procedure.  °WHAT TO EXPECT AFTER THE PROCEDURE  °After the procedure, it is typical to experience:  °Sleepiness.  °Nausea and vomiting. °HOME CARE INSTRUCTIONS  °For the first 24 hours after general anesthesia:  °Have a responsible person with you.  °Do not drive a car. If you are alone, do not take public transportation.  °Do not drink alcohol.  °Do not take medicine that has not been prescribed by your health care provider.  °Do not sign important papers or make important decisions.  °You may resume a normal diet and activities as directed by your health care provider.  °Change bandages (dressings) as directed.  °If you have questions or problems that seem related to general anesthesia, call the hospital and ask for the anesthetist or anesthesiologist on call. °SEEK MEDICAL CARE IF:  °You have nausea and vomiting that continue the day after anesthesia.  °You develop a rash. °SEEK IMMEDIATE MEDICAL CARE IF:  °You have difficulty breathing.  °You have chest pain.  °You have any allergic problems. °Document Released: 11/26/2000 Document Revised: 04/22/2013 Document Reviewed: 03/05/2013  °ExitCare® Patient Information ©2014 [employer].  ° °Sore Throat  ° ° °A sore throat is a painful, burning, sore, or scratchy feeling of the throat. There may be pain or tenderness when swallowing or talking. You may have  other symptoms with a sore throat. These include coughing, sneezing, fever, or a swollen neck. A sore throat is often the first sign of another sickness. These sicknesses may include a cold, flu, strep throat, or an infection called mono. Most sore throats go away without medical treatment.  °HOME CARE  °Only take medicine as told by your doctor.  °Drink enough fluids to keep your pee (urine) clear or pale yellow.  °Rest as needed.  °Try using throat sprays, lozenges, or suck on hard candy (if older than 4 years or as told).  °Sip warm liquids, such as broth, herbal tea, or warm water with honey. Try sucking on frozen ice pops or drinking cold liquids.  °Rinse the mouth (gargle) with salt water. Mix 1 teaspoon salt with 8 ounces of water.  °Do not smoke. Avoid being around others when they are smoking.  °Put a humidifier in your bedroom at night to moisten the air. You can also turn on a hot shower and sit in the bathroom for 5-10 minutes. Be sure the bathroom door is closed. °GET HELP RIGHT AWAY IF:  °You have trouble breathing.  °You cannot swallow fluids, soft foods, or your spit (saliva).  °You have more puffiness (swelling) in the throat.  °Your sore throat does not get better in 7 days.  °You feel sick to your stomach (nauseous) and throw up (vomit).  °You have a fever or lasting symptoms for more than 2-3 days.  °You have a fever and your symptoms suddenly get worse. °MAKE SURE YOU:  °Understand these   instructions.  °Will watch your condition.  °Will get help right away if you are not doing well or get worse. °Document Released: 05/29/2008 Document Revised: 05/14/2012 Document Reviewed: 04/27/2012  °ExitCare® Patient Information ©2015 [employer]. This information is not intended to replace advice given to you by your health care provider. Make sure you discuss any questions you have with your health care provider.  ° ° ° °

## 2014-10-08 ENCOUNTER — Telehealth: Payer: Self-pay | Admitting: Vascular Surgery

## 2014-10-08 ENCOUNTER — Other Ambulatory Visit: Payer: Self-pay

## 2014-10-08 DIAGNOSIS — G8918 Other acute postprocedural pain: Secondary | ICD-10-CM

## 2014-10-08 MED ORDER — OXYCODONE-ACETAMINOPHEN 5-325 MG PO TABS: 1.0000 | ORAL_TABLET | ORAL | Status: DC | PRN

## 2014-10-08 NOTE — Progress Notes (Signed)
Pt. called to report the Rx he rec'd on 2/4 cannot be filled since it was written for Oxycodone-Acetaminophen 5/500 mg; was informed that the medication is not available with the 500 mg Acetaminophen strength.  Discussed w/ Dr. Bridgett Larsson.  He approved a new Rx for Oxycodone/ Acetaminophen 5/325 mg 1 tab, q 4 hrs/ prn; # 30; no refills.  Pt. notified to pick up new prescription at the office.

## 2014-10-08 NOTE — Telephone Encounter (Signed)
I have tried to reach this patient at his home #, but it was not accepting calls at this time. His cell # rings, but no voicemail is available. I have mailed his appt letter to his home address, [place]

## 2014-10-08 NOTE — Telephone Encounter (Signed)
-----   Message from Mena Goes, RN sent at 10/07/2014  4:50 PM EST ----- Regarding: Schedule   ----- Message -----    From: Mal Misty, MD    Sent: 10/07/2014   9:25 AM      To: [facility] Charge Pool  10/07/2014 Surgeon Dr. Kellie Simmering alone Creation right brachialcephalic AV fistula  Patient needs appointment to see me in 6 weeks with duplex scan of right upper arm fistulaplease arrange this and notify patient

## 2014-10-11 ENCOUNTER — Encounter (HOSPITAL_COMMUNITY): Payer: Self-pay | Admitting: Vascular Surgery

## 2014-10-29 ENCOUNTER — Ambulatory Visit: Payer: Self-pay | Attending: Family Medicine

## 2014-10-29 NOTE — Patient Instructions (Signed)
Hypertension Hypertension, commonly called high blood pressure, is when the force of blood pumping through your arteries is too strong. Your arteries are the blood vessels that carry blood from your heart throughout your body. A blood pressure reading consists of a higher number over a lower number, such as 110/72. The higher number (systolic) is the pressure inside your arteries when your heart pumps. The lower number (diastolic) is the pressure inside your arteries when your heart relaxes. Ideally you want your blood pressure below 120/80. Hypertension forces your heart to work harder to pump blood. Your arteries may become narrow or stiff. Having hypertension puts you at risk for heart disease, stroke, and other problems.  RISK FACTORS Some risk factors for high blood pressure are controllable. Others are not.  Risk factors you cannot control include:   Race. You may be at higher risk if you are African American.  Age. Risk increases with age.  Gender. Men are at higher risk than women before age 45 years. After age 65, women are at higher risk than men. Risk factors you can control include:  Not getting enough exercise or physical activity.  Being overweight.  Getting too much fat, sugar, calories, or salt in your diet.  Drinking too much alcohol. SIGNS AND SYMPTOMS Hypertension does not usually cause signs or symptoms. Extremely high blood pressure (hypertensive crisis) may cause headache, anxiety, shortness of breath, and nosebleed. DIAGNOSIS  To check if you have hypertension, your health care provider will measure your blood pressure while you are seated, with your arm held at the level of your heart. It should be measured at least twice using the same arm. Certain conditions can cause a difference in blood pressure between your right and left arms. A blood pressure reading that is higher than normal on one occasion does not mean that you need treatment. If one blood pressure reading  is high, ask your health care provider about having it checked again. TREATMENT  Treating high blood pressure includes making lifestyle changes and possibly taking medicine. Living a healthy lifestyle can help lower high blood pressure. You may need to change some of your habits. Lifestyle changes may include:  Following the DASH diet. This diet is high in fruits, vegetables, and whole grains. It is low in salt, red meat, and added sugars.  Getting at least 2 hours of brisk physical activity every week.  Losing weight if necessary.  Not smoking.  Limiting alcoholic beverages.  Learning ways to reduce stress. If lifestyle changes are not enough to get your blood pressure under control, your health care provider may prescribe medicine. You may need to take more than one. Work closely with your health care provider to understand the risks and benefits. HOME CARE INSTRUCTIONS  Have your blood pressure rechecked as directed by your health care provider.   Take medicines only as directed by your health care provider. Follow the directions carefully. Blood pressure medicines must be taken as prescribed. The medicine does not work as well when you skip doses. Skipping doses also puts you at risk for problems.   Do not smoke.   Monitor your blood pressure at home as directed by your health care provider. SEEK MEDICAL CARE IF:   You think you are having a reaction to medicines taken.  You have recurrent headaches or feel dizzy.  You have swelling in your ankles.  You have trouble with your vision. SEEK IMMEDIATE MEDICAL CARE IF:  You develop a severe headache or confusion.    You have unusual weakness, numbness, or feel faint.  You have severe chest or abdominal pain.  You vomit repeatedly.  You have trouble breathing. MAKE SURE YOU:   Understand these instructions.  Will watch your condition.  Will get help right away if you are not doing well or get worse. Document  Released: 08/20/2005 Document Revised: 01/04/2014 Document Reviewed: 06/12/2013 ExitCare Patient Information 2015 [employer]. This information is not intended to replace advice given to you by your health care provider. Make sure you discuss any questions you have with your health care provider. DASH Eating Plan DASH stands for "Dietary Approaches to Stop Hypertension." The DASH eating plan is a healthy eating plan that has been shown to reduce high blood pressure (hypertension). Additional health benefits may include reducing the risk of type 2 diabetes mellitus, heart disease, and stroke. The DASH eating plan may also help with weight loss. WHAT DO I NEED TO KNOW ABOUT THE DASH EATING PLAN? For the DASH eating plan, you will follow these general guidelines:  Choose foods with a percent daily value for sodium of less than 5% (as listed on the food label).  Use salt-free seasonings or herbs instead of table salt or sea salt.  Check with your health care provider or pharmacist before using salt substitutes.  Eat lower-sodium products, often labeled as "lower sodium" or "no salt added."  Eat fresh foods.  Eat more vegetables, fruits, and low-fat dairy products.  Choose whole grains. Look for the word "whole" as the first word in the ingredient list.  Choose fish and skinless chicken or turkey more often than red meat. Limit fish, poultry, and meat to 6 oz (170 g) each day.  Limit sweets, desserts, sugars, and sugary drinks.  Choose heart-healthy fats.  Limit cheese to 1 oz (28 g) per day.  Eat more home-cooked food and less restaurant, buffet, and fast food.  Limit fried foods.  Cook foods using methods other than frying.  Limit canned vegetables. If you do use them, rinse them well to decrease the sodium.  When eating at a restaurant, ask that your food be prepared with less salt, or no salt if possible. WHAT FOODS CAN I EAT? Seek help from a dietitian for individual  calorie needs. Grains Whole grain or whole wheat bread. Brown rice. Whole grain or whole wheat pasta. Quinoa, bulgur, and whole grain cereals. Low-sodium cereals. Corn or whole wheat flour tortillas. Whole grain cornbread. Whole grain crackers. Low-sodium crackers. Vegetables Fresh or frozen vegetables (raw, steamed, roasted, or grilled). Low-sodium or reduced-sodium tomato and vegetable juices. Low-sodium or reduced-sodium tomato sauce and paste. Low-sodium or reduced-sodium canned vegetables.  Fruits All fresh, canned (in natural juice), or frozen fruits. Meat and Other Protein Products Ground beef (85% or leaner), grass-fed beef, or beef trimmed of fat. Skinless chicken or turkey. Ground chicken or turkey. Pork trimmed of fat. All fish and seafood. Eggs. Dried beans, peas, or lentils. Unsalted nuts and seeds. Unsalted canned beans. Dairy Low-fat dairy products, such as skim or 1% milk, 2% or reduced-fat cheeses, low-fat ricotta or cottage cheese, or plain low-fat yogurt. Low-sodium or reduced-sodium cheeses. Fats and Oils Tub margarines without trans fats. Light or reduced-fat mayonnaise and salad dressings (reduced sodium). Avocado. Safflower, olive, or canola oils. Natural peanut or almond butter. Other Unsalted popcorn and pretzels. The items listed above may not be a complete list of recommended foods or beverages. Contact your dietitian for more options. WHAT FOODS ARE NOT RECOMMENDED? Grains White bread.   White pasta. White rice. Refined cornbread. Bagels and croissants. Crackers that contain trans fat. Vegetables Creamed or fried vegetables. Vegetables in a cheese sauce. Regular canned vegetables. Regular canned tomato sauce and paste. Regular tomato and vegetable juices. Fruits Dried fruits. Canned fruit in light or heavy syrup. Fruit juice. Meat and Other Protein Products Fatty cuts of meat. Ribs, chicken wings, bacon, sausage, bologna, salami, chitterlings, fatback, hot dogs,  bratwurst, and packaged luncheon meats. Salted nuts and seeds. Canned beans with salt. Dairy Whole or 2% milk, cream, half-and-half, and cream cheese. Whole-fat or sweetened yogurt. Full-fat cheeses or blue cheese. Nondairy creamers and whipped toppings. Processed cheese, cheese spreads, or cheese curds. Condiments Onion and garlic salt, seasoned salt, table salt, and sea salt. Canned and packaged gravies. Worcestershire sauce. Tartar sauce. Barbecue sauce. Teriyaki sauce. Soy sauce, including reduced sodium. Steak sauce. Fish sauce. Oyster sauce. Cocktail sauce. Horseradish. Ketchup and mustard. Meat flavorings and tenderizers. Bouillon cubes. Hot sauce. Tabasco sauce. Marinades. Taco seasonings. Relishes. Fats and Oils Butter, stick margarine, lard, shortening, ghee, and bacon fat. Coconut, palm kernel, or palm oils. Regular salad dressings. Other Pickles and olives. Salted popcorn and pretzels. The items listed above may not be a complete list of foods and beverages to avoid. Contact your dietitian for more information. WHERE CAN I FIND MORE INFORMATION? National Heart, Lung, and Blood Institute: www.nhlbi.nih.gov/health/health-topics/topics/dash/ Document Released: 08/09/2011 Document Revised: 01/04/2014 Document Reviewed: 06/24/2013 ExitCare Patient Information 2015 [employer]. This information is not intended to replace advice given to you by your health care provider. Make sure you discuss any questions you have with your health care provider.  

## 2014-10-29 NOTE — Progress Notes (Unsigned)
Patient ID: [name], male   DOB: [birth year], 54 y.o.   MRN: [id number] Pt comes in today for blood pressure recheck after elevated BP @ [facility] Pt was told to f/u today for BP- 198/101 with c/o SOB Denies pain ,swelling or sob today States she didn't take BP meds today  BP- 176/81 96 Pt established care with Dr. Verl Blalock in January. Will need to schedule OV for to establish PCP Refills on Calcitrol, and Pepcid reordered; e-scribed to [place]

## 2014-11-05 ENCOUNTER — Ambulatory Visit: Payer: Self-pay | Attending: Family Medicine | Admitting: Family Medicine

## 2014-11-05 ENCOUNTER — Encounter: Payer: Self-pay | Admitting: Family Medicine

## 2014-11-05 VITALS — BP 123/71 | HR 72 | Temp 98.2°F | Resp 18 | Ht 71.0 in | Wt 192.0 lb

## 2014-11-05 DIAGNOSIS — K219 Gastro-esophageal reflux disease without esophagitis: Secondary | ICD-10-CM

## 2014-11-05 DIAGNOSIS — N184 Chronic kidney disease, stage 4 (severe): Secondary | ICD-10-CM

## 2014-11-05 DIAGNOSIS — IMO0002 Reserved for concepts with insufficient information to code with codable children: Secondary | ICD-10-CM

## 2014-11-05 DIAGNOSIS — Z0000 Encounter for general adult medical examination without abnormal findings: Secondary | ICD-10-CM

## 2014-11-05 DIAGNOSIS — E1129 Type 2 diabetes mellitus with other diabetic kidney complication: Secondary | ICD-10-CM

## 2014-11-05 DIAGNOSIS — E1165 Type 2 diabetes mellitus with hyperglycemia: Secondary | ICD-10-CM

## 2014-11-05 LAB — GLUCOSE, POCT (MANUAL RESULT ENTRY): POC Glucose: 61 mg/dl — AB (ref 70–99)

## 2014-11-05 LAB — POCT GLYCOSYLATED HEMOGLOBIN (HGB A1C): HEMOGLOBIN A1C: 6.1

## 2014-11-05 MED ORDER — CALCITRIOL 0.25 MCG PO CAPS
0.2500 ug | ORAL_CAPSULE | Freq: Every day | ORAL | Status: DC
Start: 2014-11-05 — End: 2015-07-06

## 2014-11-05 NOTE — Patient Instructions (Signed)
[name],  Thank you for coming in today. It was a pleasure meeting you. I look forward to being your primary doctor.  1. Refilled calcitriol  2. Referral to renal to help monitor kidneys.  Please have disability paperwork sent to me Dr. [name], MD [address] [place] [id number] [phone] (phone) [phone] (fax)   F/u in 3 months for diabetes   Dr. Adrian Blackwater

## 2014-11-05 NOTE — Progress Notes (Signed)
   Subjective:    Patient ID: [name], male    DOB: Nov 26, 1960, 54 y.o.   MRN: [id number] CC: establish care, needs PCP as he is applying for disability  HPI 54 yo M with CKD, CHF, DM2  1. CHF: managed by cardiology. No CP, SOB, edema. Compliant with medication regimen  2. CKD stage  IV: medication management for now. Has R UE AV fistula in case CKD progresses to HD. Does not see renal. Needs a refill of calcitriol.   3. DM2: on glipizide only. No low CBGs.   Soc Hx: non smoker  Review of Systems As per HPI     Objective:   Physical Exam BP 123/71 mmHg  Pulse 72  Temp(Src) 98.2 F (36.8 C) (Oral)  Resp 18  Ht 5\' 11"  (1.803 m)  Wt 192 lb (87.091 kg)  BMI 26.79 kg/m2  SpO2 98%  Wt Readings from Last 3 Encounters:  11/05/14 192 lb (87.091 kg)  10/07/14 195 lb (88.451 kg)  09/28/14 195 lb (88.451 kg)   BP Readings from Last 3 Encounters:  11/05/14 123/71  10/29/14 176/81  10/07/14 169/77  General appearance: alert, cooperative and no distress Lungs: clear to auscultation bilaterally Heart: regular rate and rhythm, S1, S2 normal, no murmur, click, rub or gallop Extremities: extremities normal, atraumatic, no cyanosis or edema  Lab Results  Component Value Date   HGBA1C 6.1 11/05/2014   CBG 61      Assessment & Plan:

## 2014-11-05 NOTE — Progress Notes (Signed)
[facility] F/U Hx kidney failure and Heart Failure

## 2014-11-06 LAB — MICROALBUMIN / CREATININE URINE RATIO
Creatinine, Urine: 81 mg/dL
Microalb Creat Ratio: 2582.7 mg/g — ABNORMAL HIGH (ref 0.0–30.0)
Microalb, Ur: 209.2 mg/dL — ABNORMAL HIGH (ref <2.0)

## 2014-11-08 ENCOUNTER — Encounter: Payer: Self-pay | Admitting: Family Medicine

## 2014-11-08 DIAGNOSIS — K219 Gastro-esophageal reflux disease without esophagitis: Secondary | ICD-10-CM | POA: Insufficient documentation

## 2014-11-08 NOTE — Assessment & Plan Note (Signed)
A: controlled BP, weight, blood sugars. P: Refilled calcitriol Referral to renal to help monitor kidneys.

## 2014-11-08 NOTE — Assessment & Plan Note (Signed)
A: A1c at goal with low CBG this AM P: Continue current regimen

## 2014-11-12 ENCOUNTER — Telehealth: Payer: Self-pay | Admitting: Family Medicine

## 2014-11-12 NOTE — Telephone Encounter (Signed)
Audrea Muscat nurse practitioner from [facility] called requesting to speak to Dr.Funches regarding pt. Please f/u with NP at phone number provided.

## 2014-11-15 ENCOUNTER — Encounter: Payer: Self-pay | Admitting: Vascular Surgery

## 2014-11-16 ENCOUNTER — Encounter: Payer: Self-pay | Admitting: Vascular Surgery

## 2014-11-16 ENCOUNTER — Other Ambulatory Visit: Payer: Self-pay | Admitting: Vascular Surgery

## 2014-11-16 ENCOUNTER — Ambulatory Visit (INDEPENDENT_AMBULATORY_CARE_PROVIDER_SITE_OTHER): Payer: Self-pay | Admitting: Vascular Surgery

## 2014-11-16 ENCOUNTER — Ambulatory Visit (HOSPITAL_COMMUNITY)
Admission: RE | Admit: 2014-11-16 | Discharge: 2014-11-16 | Disposition: A | Discharge status: Home / Self Care | Payer: Medicaid Other | Source: Ambulatory Visit | Attending: Vascular Surgery | Admitting: Vascular Surgery

## 2014-11-16 VITALS — BP 183/93 | HR 91 | Ht 71.0 in | Wt 195.0 lb

## 2014-11-16 DIAGNOSIS — Z4931 Encounter for adequacy testing for hemodialysis: Secondary | ICD-10-CM | POA: Diagnosis not present

## 2014-11-16 DIAGNOSIS — N186 End stage renal disease: Secondary | ICD-10-CM | POA: Insufficient documentation

## 2014-11-16 DIAGNOSIS — N189 Chronic kidney disease, unspecified: Secondary | ICD-10-CM

## 2014-11-16 DIAGNOSIS — N184 Chronic kidney disease, stage 4 (severe): Secondary | ICD-10-CM

## 2014-11-16 NOTE — Progress Notes (Signed)
Subjective:     Patient ID: [name], male   DOB: July 28, 1961, 54 y.o.   MRN: [id number]  HPI This 54 year old male with stage IV chronic kidney disease returns for initial follow-up regarding his right brachial-cephalic AV fistula which I created 10/07/2014. Patient denies any pain or numbness in the right hand. He states that the incision healed nicely. He has not been to see a kidney doctor for follow-up yet. He has never been on hemodialysis.   Review of Systems     Objective:   Physical Exam BP 183/93 mmHg  Pulse 91  Ht 5\' 11"  (1.803 m)  Wt 195 lb (88.451 kg)  BMI 27.21 kg/m2  SpO2 100%   Gen. Well-developed well-nourished male no apparent stress alert and oriented 3  right antecubital wound has healed nicely with good pulse and palpable thrill and cephalic vein in the upper arm. 1-2+ radial pulse palpable which does improve with compression of the fistula. Radial artery is calcified palpation. Right hand well perfused. Today I ordered a duplex scan of the right upper arm brachialcephalic AV fistula. It is of good caliber with excellent flow throughout. There is one competing branch which I also visualized with the sono site in the distal upper arm which does not appear to be significantly affecting flow through the fistula at this time.     Assessment:      brachial cephalic AV fistula functioning nicely in patient with stage IV chronic kidney disease not on hemodialysis  one small competing branch to which does not appear to be affecting the flow in the fistula     Plan:      return to see me on when necessary basis

## 2014-11-29 ENCOUNTER — Encounter (HOSPITAL_COMMUNITY): Payer: Self-pay | Admitting: Neurology

## 2014-11-29 ENCOUNTER — Emergency Department (HOSPITAL_COMMUNITY): Payer: Medicaid Other

## 2014-11-29 ENCOUNTER — Inpatient Hospital Stay (HOSPITAL_COMMUNITY)
Admission: EM | Admit: 2014-11-29 | Discharge: 2014-12-01 | DRG: 202 | Disposition: A | Discharge status: Home / Self Care | Payer: Medicaid Other | Attending: Internal Medicine | Admitting: Internal Medicine

## 2014-11-29 DIAGNOSIS — N185 Chronic kidney disease, stage 5: Secondary | ICD-10-CM | POA: Diagnosis present

## 2014-11-29 DIAGNOSIS — J302 Other seasonal allergic rhinitis: Secondary | ICD-10-CM | POA: Diagnosis present

## 2014-11-29 DIAGNOSIS — E11649 Type 2 diabetes mellitus with hypoglycemia without coma: Secondary | ICD-10-CM | POA: Diagnosis present

## 2014-11-29 DIAGNOSIS — K219 Gastro-esophageal reflux disease without esophagitis: Secondary | ICD-10-CM | POA: Diagnosis present

## 2014-11-29 DIAGNOSIS — I120 Hypertensive chronic kidney disease with stage 5 chronic kidney disease or end stage renal disease: Secondary | ICD-10-CM | POA: Diagnosis present

## 2014-11-29 DIAGNOSIS — R748 Abnormal levels of other serum enzymes: Secondary | ICD-10-CM | POA: Diagnosis present

## 2014-11-29 DIAGNOSIS — D631 Anemia in chronic kidney disease: Secondary | ICD-10-CM | POA: Diagnosis present

## 2014-11-29 DIAGNOSIS — I739 Peripheral vascular disease, unspecified: Secondary | ICD-10-CM | POA: Diagnosis present

## 2014-11-29 DIAGNOSIS — I10 Essential (primary) hypertension: Secondary | ICD-10-CM

## 2014-11-29 DIAGNOSIS — J40 Bronchitis, not specified as acute or chronic: Principal | ICD-10-CM | POA: Diagnosis present

## 2014-11-29 DIAGNOSIS — E1165 Type 2 diabetes mellitus with hyperglycemia: Secondary | ICD-10-CM | POA: Diagnosis present

## 2014-11-29 DIAGNOSIS — E1129 Type 2 diabetes mellitus with other diabetic kidney complication: Secondary | ICD-10-CM

## 2014-11-29 DIAGNOSIS — IMO0001 Reserved for inherently not codable concepts without codable children: Secondary | ICD-10-CM | POA: Diagnosis present

## 2014-11-29 DIAGNOSIS — D638 Anemia in other chronic diseases classified elsewhere: Secondary | ICD-10-CM | POA: Diagnosis present

## 2014-11-29 DIAGNOSIS — Z79899 Other long term (current) drug therapy: Secondary | ICD-10-CM | POA: Diagnosis not present

## 2014-11-29 DIAGNOSIS — R079 Chest pain, unspecified: Secondary | ICD-10-CM

## 2014-11-29 DIAGNOSIS — N184 Chronic kidney disease, stage 4 (severe): Secondary | ICD-10-CM

## 2014-11-29 DIAGNOSIS — I5032 Chronic diastolic (congestive) heart failure: Secondary | ICD-10-CM | POA: Diagnosis present

## 2014-11-29 DIAGNOSIS — E1122 Type 2 diabetes mellitus with diabetic chronic kidney disease: Secondary | ICD-10-CM | POA: Diagnosis present

## 2014-11-29 DIAGNOSIS — D649 Anemia, unspecified: Secondary | ICD-10-CM | POA: Diagnosis present

## 2014-11-29 DIAGNOSIS — R05 Cough: Secondary | ICD-10-CM | POA: Diagnosis present

## 2014-11-29 DIAGNOSIS — J069 Acute upper respiratory infection, unspecified: Secondary | ICD-10-CM | POA: Diagnosis present

## 2014-11-29 LAB — BASIC METABOLIC PANEL
Anion gap: 9 (ref 5–15)
BUN: 58 mg/dL — AB (ref 6–23)
CALCIUM: 8.7 mg/dL (ref 8.4–10.5)
CO2: 20 mmol/L (ref 19–32)
CREATININE: 6.78 mg/dL — AB (ref 0.50–1.35)
Chloride: 112 mmol/L (ref 96–112)
GFR calc Af Amer: 10 mL/min — ABNORMAL LOW (ref >90)
GFR, EST NON AFRICAN AMERICAN: 8 mL/min — AB (ref >90)
GLUCOSE: 83 mg/dL (ref 70–99)
POTASSIUM: 4.1 mmol/L (ref 3.5–5.1)
Sodium: 141 mmol/L (ref 135–145)

## 2014-11-29 LAB — URINALYSIS, ROUTINE W REFLEX MICROSCOPIC
BILIRUBIN URINE: NEGATIVE
Glucose, UA: 100 mg/dL — AB
KETONES UR: NEGATIVE mg/dL
LEUKOCYTES UA: NEGATIVE
NITRITE: NEGATIVE
SPECIFIC GRAVITY, URINE: 1.012 (ref 1.005–1.030)
UROBILINOGEN UA: 0.2 mg/dL (ref 0.0–1.0)
pH: 7 (ref 5.0–8.0)

## 2014-11-29 LAB — CBC
HCT: 23.2 % — ABNORMAL LOW (ref 39.0–52.0)
Hemoglobin: 7.6 g/dL — ABNORMAL LOW (ref 13.0–17.0)
MCH: 30.9 pg (ref 26.0–34.0)
MCHC: 32.8 g/dL (ref 30.0–36.0)
MCV: 94.3 fL (ref 78.0–100.0)
Platelets: 394 10*3/uL (ref 150–400)
RBC: 2.46 MIL/uL — AB (ref 4.22–5.81)
RDW: 15.9 % — AB (ref 11.5–15.5)
WBC: 9.8 10*3/uL (ref 4.0–10.5)

## 2014-11-29 LAB — INFLUENZA PANEL BY PCR (TYPE A & B)
H1N1FLUPCR: NOT DETECTED
INFLAPCR: NEGATIVE
Influenza B By PCR: NEGATIVE

## 2014-11-29 LAB — ABO/RH: ABO/RH(D): O POS

## 2014-11-29 LAB — URINE MICROSCOPIC-ADD ON

## 2014-11-29 LAB — BRAIN NATRIURETIC PEPTIDE: B Natriuretic Peptide: 1981.6 pg/mL — ABNORMAL HIGH (ref 0.0–100.0)

## 2014-11-29 LAB — PREPARE RBC (CROSSMATCH)

## 2014-11-29 LAB — TROPONIN I: Troponin I: 0.05 ng/mL — ABNORMAL HIGH (ref <0.031)

## 2014-11-29 MED ORDER — ACETAMINOPHEN 325 MG PO TABS
650.0000 mg | ORAL_TABLET | Freq: Four times a day (QID) | ORAL | Status: DC | PRN
  Administered 2014-12-01: 650 mg via ORAL
  Filled 2014-11-29: qty 2

## 2014-11-29 MED ORDER — HYDROCODONE-ACETAMINOPHEN 5-325 MG PO TABS
1.0000 | ORAL_TABLET | ORAL | Status: DC | PRN
  Administered 2014-11-30: 1 via ORAL
  Filled 2014-11-29: qty 1

## 2014-11-29 MED ORDER — SODIUM CHLORIDE 0.9 % IV SOLN
10.0000 mL/h | Freq: Once | INTRAVENOUS | Status: AC
  Administered 2014-11-29: 10 mL/h via INTRAVENOUS

## 2014-11-29 MED ORDER — FAMOTIDINE 20 MG PO TABS
20.0000 mg | ORAL_TABLET | Freq: Every day | ORAL | Status: DC
  Administered 2014-11-30 – 2014-12-01 (×2): 20 mg via ORAL
  Filled 2014-11-29 (×2): qty 1

## 2014-11-29 MED ORDER — CALCITRIOL 0.25 MCG PO CAPS
0.2500 ug | ORAL_CAPSULE | Freq: Every day | ORAL | Status: DC
  Administered 2014-11-30 – 2014-12-01 (×2): 0.25 ug via ORAL
  Filled 2014-11-29 (×2): qty 1

## 2014-11-29 MED ORDER — ONDANSETRON HCL 4 MG PO TABS: 4.0000 mg | ORAL_TABLET | Freq: Four times a day (QID) | ORAL | Status: DC | PRN

## 2014-11-29 MED ORDER — ENSURE ENLIVE PO LIQD
237.0000 mL | Freq: Two times a day (BID) | ORAL | Status: DC
  Administered 2014-11-30 – 2014-12-01 (×3): 237 mL via ORAL

## 2014-11-29 MED ORDER — CALCIUM ACETATE 667 MG PO CAPS
1334.0000 mg | ORAL_CAPSULE | Freq: Three times a day (TID) | ORAL | Status: DC
  Administered 2014-11-29 – 2014-12-01 (×6): 1334 mg via ORAL
  Filled 2014-11-29 (×9): qty 2

## 2014-11-29 MED ORDER — SODIUM CHLORIDE 0.9 % IJ SOLN: 3.0000 mL | Freq: Two times a day (BID) | INTRAMUSCULAR | Status: DC

## 2014-11-29 MED ORDER — SODIUM CHLORIDE 0.9 % IJ SOLN
3.0000 mL | Freq: Two times a day (BID) | INTRAMUSCULAR | Status: DC
  Administered 2014-11-30 (×2): 3 mL via INTRAVENOUS

## 2014-11-29 MED ORDER — SODIUM CHLORIDE 0.9 % IJ SOLN: 3.0000 mL | INTRAMUSCULAR | Status: DC | PRN

## 2014-11-29 MED ORDER — GLIPIZIDE 10 MG PO TABS
10.0000 mg | ORAL_TABLET | Freq: Two times a day (BID) | ORAL | Status: DC
  Administered 2014-11-29: 10 mg via ORAL
  Filled 2014-11-29 (×4): qty 1

## 2014-11-29 MED ORDER — MORPHINE SULFATE 2 MG/ML IJ SOLN: 2.0000 mg | INTRAMUSCULAR | Status: DC | PRN

## 2014-11-29 MED ORDER — FUROSEMIDE 80 MG PO TABS
160.0000 mg | ORAL_TABLET | Freq: Two times a day (BID) | ORAL | Status: DC
  Administered 2014-11-29 – 2014-11-30 (×3): 160 mg via ORAL
  Filled 2014-11-29 (×6): qty 2

## 2014-11-29 MED ORDER — CARVEDILOL 25 MG PO TABS
25.0000 mg | ORAL_TABLET | Freq: Two times a day (BID) | ORAL | Status: DC
  Administered 2014-11-29 – 2014-12-01 (×4): 25 mg via ORAL
  Filled 2014-11-29 (×6): qty 1

## 2014-11-29 MED ORDER — SODIUM CHLORIDE 0.9 % IV SOLN: 250.0000 mL | INTRAVENOUS | Status: DC | PRN

## 2014-11-29 MED ORDER — HYDROCOD POLST-CHLORPHEN POLST 10-8 MG/5ML PO LQCR
5.0000 mL | Freq: Once | ORAL | Status: AC
  Administered 2014-11-29: 5 mL via ORAL
  Filled 2014-11-29: qty 5

## 2014-11-29 MED ORDER — ONDANSETRON HCL 4 MG/2ML IJ SOLN: 4.0000 mg | Freq: Four times a day (QID) | INTRAMUSCULAR | Status: DC | PRN

## 2014-11-29 MED ORDER — GUAIFENESIN ER 600 MG PO TB12
1200.0000 mg | ORAL_TABLET | Freq: Two times a day (BID) | ORAL | Status: DC
  Administered 2014-11-29 – 2014-12-01 (×4): 1200 mg via ORAL
  Filled 2014-11-29 (×5): qty 2

## 2014-11-29 MED ORDER — HYDRALAZINE HCL 50 MG PO TABS
50.0000 mg | ORAL_TABLET | Freq: Three times a day (TID) | ORAL | Status: DC
  Administered 2014-11-29 – 2014-12-01 (×6): 50 mg via ORAL
  Filled 2014-11-29 (×8): qty 1

## 2014-11-29 MED ORDER — ACETAMINOPHEN 650 MG RE SUPP: 650.0000 mg | Freq: Four times a day (QID) | RECTAL | Status: DC | PRN

## 2014-11-29 NOTE — ED Notes (Signed)
MD at bedside. 

## 2014-11-29 NOTE — ED Provider Notes (Signed)
CSN: [id number]     Arrival date & time 11/29/14  5188 History   First MD Initiated Contact with Patient 11/29/14 1000     Chief Complaint  Patient presents with  . Cough     (Consider location/radiation/quality/duration/timing/severity/associated sxs/prior Treatment) Patient is a 54 y.o. male presenting with cough. The history is provided by the patient and the EMS personnel.  Cough Associated symptoms: chest pain and shortness of breath   Associated symptoms: no fever and no rash    patient with several concerns. Patient's had a cough was coughing up some bright red blood. Cough is been fairly persistent for days of coughing up blood is just been overnight. Patient's had some chest pain on and off sometimes will last 15-20 minutes. Chest pain is occasionally made worse by the cough. Patient's been feeling lightheaded and dizzy periods had some diarrhea no vomiting. No belly pain.  Patient is followed by cardiology known to have a history of congestive heart failure known to have diabetes known to have chronic renal disease about raise start dialysis.  Past Medical History  Diagnosis Date  . PVD (peripheral vascular disease)   . Shortness of breath dyspnea   . Constipation   . Anemia     unsure  . CHF (congestive heart failure) 2015  . Diastolic congestive heart failure   . Diabetes mellitus 2015    type 2  . GERD (gastroesophageal reflux disease)     unsure  . Hypertension 2015  . Chronic renal disease, stage IV 2015  . Cocaine abuse     last used 3 month go   . Cellulitis and abscess of foot 08/24/2011   Past Surgical History  Procedure Laterality Date  . Leg surgery    . Knee surgery Right   . Amputation  09/06/2011    Procedure: AMPUTATION RAY;  Surgeon: Wylene Simmer, MD;  Location: [place];  Service: Orthopedics;  Laterality: Left;  Left Hallux Amputation  . Av fistula placement Left 08/12/2014    Procedure: CREATION OF A BRACHIOCEPHALIC ARTERIOVENOUS (AV) FISTULA  LEFT  ARM;  Surgeon: Mal Misty, MD;  Location: [place];  Service: Vascular;  Laterality: Left;  . Av fistula placement Right 10/07/2014    Procedure: Creation Right Arm Arteriovenous fistula;  Surgeon: Mal Misty, MD;  Location: [facility];  Service: Vascular;  Laterality: Right;  . Appendectomy     Family History  Problem Relation Age of Onset  . Adopted: Yes  . Varicose Veins Mother   . Varicose Veins Sister    History  Substance Use Topics  . Smoking status: Never Smoker   . Smokeless tobacco: Never Used  . Alcohol Use: No    Review of Systems  Constitutional: Positive for fatigue. Negative for fever.  HENT: Positive for congestion.   Respiratory: Positive for cough and shortness of breath.   Cardiovascular: Positive for chest pain.  Gastrointestinal: Positive for diarrhea. Negative for abdominal pain.  Musculoskeletal: Negative for back pain.  Skin: Positive for pallor. Negative for rash.  Neurological: Positive for dizziness.  Hematological: Does not bruise/bleed easily.  Psychiatric/Behavioral: Negative for confusion.      Allergies  Review of patient's allergies indicates no known allergies.  Home Medications   Prior to Admission medications   Medication Sig Start Date End Date Taking? Authorizing Provider  calcitRIOL (ROCALTROL) 0.25 MCG capsule Take 1 capsule (0.25 mcg total) by mouth daily. 11/05/14   Josalyn Funches, MD  calcium acetate (PHOSLO) 667 MG capsule  Take 2 capsules (1,334 mg total) by mouth 3 (three) times daily with meals. 08/09/14   Karlene Einstein, MD  carvedilol (COREG) 25 MG tablet Take 1 tablet (25 mg total) by mouth 2 (two) times daily with a meal. 09/15/14   Renella Cunas, MD  famotidine (PEPCID) 20 MG tablet Take 1 tablet (20 mg total) by mouth daily. 09/15/14   Renella Cunas, MD  furosemide (LASIX) 80 MG tablet Take 2 tablets (160 mg total) by mouth 2 (two) times daily. 09/15/14   Renella Cunas, MD  glipiZIDE (GLUCOTROL) 10 MG tablet Take 1 tablet (10  mg total) by mouth 2 (two) times daily before a meal. 09/15/14   Renella Cunas, MD  hydrALAZINE (APRESOLINE) 50 MG tablet Take 1 tablet (50 mg total) by mouth 3 (three) times daily. 09/15/14   Renella Cunas, MD   BP 178/75 mmHg  Pulse 90  Temp(Src) 98.8 F (37.1 C) (Oral)  Resp 28  SpO2 97% Physical Exam  Constitutional: He is oriented to person, place, and time. He appears well-developed and well-nourished. No distress.  HENT:  Head: Normocephalic and atraumatic.  Mouth/Throat: Oropharynx is clear and moist.  Eyes: Conjunctivae and EOM are normal. Pupils are equal, round, and reactive to light.  Neck: Normal range of motion.  Cardiovascular: Normal rate and regular rhythm.   Pulmonary/Chest: Effort normal and breath sounds normal. No respiratory distress.  Abdominal: Bowel sounds are normal. There is no tenderness.  Musculoskeletal: Normal range of motion. He exhibits edema.  Trace  Neurological: He is alert and oriented to person, place, and time. No cranial nerve deficit. He exhibits normal muscle tone. Coordination normal.  Skin: Skin is warm.  Nursing note and vitals reviewed.   ED Course  Procedures (including critical care time) Labs Review Labs Reviewed  CBC - Abnormal; Notable for the following:    RBC 2.46 (*)    Hemoglobin 7.6 (*)    HCT 23.2 (*)    RDW 15.9 (*)    All other components within normal limits  BASIC METABOLIC PANEL - Abnormal; Notable for the following:    BUN 58 (*)    Creatinine, Ser 6.78 (*)    GFR calc non Af Amer 8 (*)    GFR calc Af Amer 10 (*)    All other components within normal limits  TROPONIN I - Abnormal; Notable for the following:    Troponin I 0.05 (*)    All other components within normal limits  URINALYSIS, ROUTINE W REFLEX MICROSCOPIC - Abnormal; Notable for the following:    Glucose, UA 100 (*)    Hgb urine dipstick SMALL (*)    Protein, ur >300 (*)    All other components within normal limits  URINE MICROSCOPIC-ADD ON -  Abnormal; Notable for the following:    Casts GRANULAR CAST (*)    All other components within normal limits  CLOSTRIDIUM DIFFICILE BY PCR  INFLUENZA PANEL BY PCR (TYPE A & B, H1N1)  I-STAT TROPOININ, ED  TYPE AND SCREEN  PREPARE RBC (CROSSMATCH)  ABO/RH   Results for orders placed or performed during the hospital encounter of 11/29/14  CBC  Result Value Ref Range   WBC 9.8 4.0 - 10.5 K/uL   RBC 2.46 (L) 4.22 - 5.81 MIL/uL   Hemoglobin 7.6 (L) 13.0 - 17.0 g/dL   HCT 23.2 (L) 39.0 - 52.0 %   MCV 94.3 78.0 - 100.0 fL   MCH 30.9 26.0 - 34.0  pg   MCHC 32.8 30.0 - 36.0 g/dL   RDW 15.9 (H) 11.5 - 15.5 %   Platelets 394 150 - 400 K/uL  Basic metabolic panel  Result Value Ref Range   Sodium 141 135 - 145 mmol/L   Potassium 4.1 3.5 - 5.1 mmol/L   Chloride 112 96 - 112 mmol/L   CO2 20 19 - 32 mmol/L   Glucose, Bld 83 70 - 99 mg/dL   BUN 58 (H) 6 - 23 mg/dL   Creatinine, Ser 6.78 (H) 0.50 - 1.35 mg/dL   Calcium 8.7 8.4 - 10.5 mg/dL   GFR calc non Af Amer 8 (L) >90 mL/min   GFR calc Af Amer 10 (L) >90 mL/min   Anion gap 9 5 - 15  Troponin I  Result Value Ref Range   Troponin I 0.05 (H) <0.031 ng/mL  Urinalysis, Routine w reflex microscopic  Result Value Ref Range   Color, Urine YELLOW YELLOW   APPearance CLEAR CLEAR   Specific Gravity, Urine 1.012 1.005 - 1.030   pH 7.0 5.0 - 8.0   Glucose, UA 100 (A) NEGATIVE mg/dL   Hgb urine dipstick SMALL (A) NEGATIVE   Bilirubin Urine NEGATIVE NEGATIVE   Ketones, ur NEGATIVE NEGATIVE mg/dL   Protein, ur >300 (A) NEGATIVE mg/dL   Urobilinogen, UA 0.2 0.0 - 1.0 mg/dL   Nitrite NEGATIVE NEGATIVE   Leukocytes, UA NEGATIVE NEGATIVE  Urine microscopic-add on  Result Value Ref Range   Squamous Epithelial / LPF RARE RARE   WBC, UA 0-2 <3 WBC/hpf   RBC / HPF 0-2 <3 RBC/hpf   Bacteria, UA RARE RARE   Casts GRANULAR CAST (A) NEGATIVE  Type and screen  Result Value Ref Range   ABO/RH(D) O POS    Antibody Screen NEG    Sample Expiration  12/02/2014    Unit Number [id number]    Blood Component Type RED CELLS,LR    Unit division 00    Status of Unit ALLOCATED    Transfusion Status OK TO TRANSFUSE    Crossmatch Result Compatible    Unit Number [id number]    Blood Component Type RBC LR PHER1    Unit division 00    Status of Unit ALLOCATED    Transfusion Status OK TO TRANSFUSE    Crossmatch Result Compatible   Prepare RBC  Result Value Ref Range   Order Confirmation ORDER PROCESSED BY BLOOD BANK   ABO/Rh  Result Value Ref Range   ABO/RH(D) O POS      Imaging Review Dg Chest 2 View  11/29/2014   CLINICAL DATA:  Cough for 3 days, worse today.  EXAM: CHEST  2 VIEW  COMPARISON:  09/14/2014  FINDINGS: The heart size and mediastinal contours are within normal limits. Both lungs are clear. The visualized skeletal structures are unremarkable.  IMPRESSION: No active cardiopulmonary disease.   Electronically Signed   By: Kathreen Devoid   On: 11/29/2014 10:37     EKG Interpretation   Date/Time:  Monday November 29 2014 10:00:52 EDT Ventricular Rate:  89 PR Interval:  127 QRS Duration: 88 QT Interval:  368 QTC Calculation: 448 R Axis:   48 Text Interpretation:  Sinus rhythm Confirmed by Jenner Rosier  MD, Daniele Dillow  ([id number]) on 11/29/2014 10:06:27 AM     CRITICAL CARE Performed by: Fredia Sorrow Total critical care time: 30 Critical care time was exclusive of separately billable procedures and treating other patients. Critical care was necessary to treat or prevent imminent or life-threatening  deterioration. Critical care was time spent personally by me on the following activities: development of treatment plan with patient and/or surrogate as well as nursing, discussions with consultants, evaluation of patient's response to treatment, examination of patient, obtaining history from patient or surrogate, ordering and performing treatments and interventions, ordering and review of laboratory studies, ordering and review of  radiographic studies, pulse oximetry and re-evaluation of patient's condition.     MDM   Final diagnoses:  Anemia, unspecified anemia type  Chest pain, unspecified chest pain type  Bronchitis    Patient presenting to the ED with several complaints. Main one was the persistent cough also feeling dizzy. Stated that he was coughing up bright red blood less than a cup. Patient stated that he was having fevers feeling. And also chest pain. Chest pain made worse with cough.  Patient has a mildly elevated troponin has had mildly elevated troponins before. Has significant renal insufficiency is pending start of dialysis. Patient's amount of blood that is coughed up does not seem to be enough to drop his hemoglobin and hematocrit. But has a hemoglobin below 8.  Blood transfusion ordered. Patient not hypotensive occasionally slightly tachycardic no fever. Chest x-ray negative for pneumonia. No blood in stools by history. Patient with marked renal insufficiency no hyperkalemia.  Patient is followed by wellness clinic. Patient is also followed by Dr. wall from cardiology. And patient is also followed by nephrology.  Patient possibly could have flulike symptoms. Also has been having some diarrhea could be having C. difficile patient has no significant abdominal tenderness.  Blood transfusion ordered 2 units to be given. We'll arrange an unassigned admission.  Fredia Sorrow, MD 11/29/14 [phone]

## 2014-11-29 NOTE — ED Notes (Signed)
Internal Med at bedside.

## 2014-11-29 NOTE — Progress Notes (Addendum)
Called Melissa RN in ED to get report.

## 2014-11-29 NOTE — H&P (Signed)
[facility] History and Physical  [name] ATF:[id number] DOB: Mar 25, 1961 DOA: 11/29/2014  Referring physician: Dr. Fredia Sorrow, EDP PCP: Jenell Milliner, MD  Specialists:   Chief Complaint: Cough  HPI: [name] is a 54 y.o. male  With a history of diastolic heart failure, chronic kidney disease, stage 5,, diabetes mellitus, presented to the emergency department with complaints of cough for a few days, nonproductive. Patient also stated he has chest pain associated with his cough discussed approximately 20 minutes yesterday and resolved on its own. Patient denied any diaphoresis with this chest pain. He states overnight he did have some lightheadedness as well as sweating. Patient also states he's had one day of diarrhea. Patient states that his 32-year-old daughter did have a cough approximately 1 week ago. In the ER, patient was found to have a hemoglobin of 7.6, chest x-ray was negative, UA was negative, troponin slightly elevated at 0.05. [facility] was called for admission and workup of anemia.  Review of Systems:  Constitutional: Denies fever, chills, diaphoresis, appetite change and fatigue.  HEENT: Denies photophobia, eye pain, redness, hearing loss, ear pain, congestion, sore throat, rhinorrhea, sneezing, mouth sores, trouble swallowing, neck pain, neck stiffness and tinnitus.   Respiratory: Complains of some shortness of breath with cough. Cardiovascular: Complains of chest pain with cough. Gastrointestinal: Denies abdominal pain, Nausea, vomiting. Does complain of diarrhea. Genitourinary: Denies dysuria, urgency, frequency, hematuria, flank pain and difficulty urinating.  Musculoskeletal: Denies myalgias, back pain, joint swelling, arthralgias and gait problem.  Skin: Feels pale. Neurological: complaints of dizziness and lightheadedness.  Hematological: Denies adenopathy. Easy bruising, personal or family bleeding history  Psychiatric/Behavioral: Denies  suicidal ideation, mood changes, confusion, nervousness, sleep disturbance and agitation  Past Medical History  Diagnosis Date  . PVD (peripheral vascular disease)   . Shortness of breath dyspnea   . Constipation   . Anemia     unsure  . CHF (congestive heart failure) 2015  . Diastolic congestive heart failure   . Diabetes mellitus 2015    type 2  . GERD (gastroesophageal reflux disease)     unsure  . Hypertension 2015  . Chronic renal disease, stage IV 2015  . Cocaine abuse     last used 3 month go   . Cellulitis and abscess of foot 08/24/2011   Past Surgical History  Procedure Laterality Date  . Leg surgery    . Knee surgery Right   . Amputation  09/06/2011    Procedure: AMPUTATION RAY;  Surgeon: Wylene Simmer, MD;  Location: [place];  Service: Orthopedics;  Laterality: Left;  Left Hallux Amputation  . Av fistula placement Left 08/12/2014    Procedure: CREATION OF A BRACHIOCEPHALIC ARTERIOVENOUS (AV) FISTULA  LEFT ARM;  Surgeon: Mal Misty, MD;  Location: [place];  Service: Vascular;  Laterality: Left;  . Av fistula placement Right 10/07/2014    Procedure: Creation Right Arm Arteriovenous fistula;  Surgeon: Mal Misty, MD;  Location: [place];  Service: Vascular;  Laterality: Right;  . Appendectomy     Social History:  reports that he has never smoked. He has never used smokeless tobacco. He reports that he uses illicit drugs (Cocaine). He reports that he does not drink alcohol.   No Known Allergies  Family History  Problem Relation Age of Onset  . Adopted: Yes  . Varicose Veins Mother   . Varicose Veins Sister     Prior to Admission medications   Medication Sig Start Date End Date Taking? Authorizing Provider  calcitRIOL (ROCALTROL) 0.25 MCG capsule Take 1 capsule (0.25 mcg total) by mouth daily. 11/05/14   [name], MD  calcium acetate (PHOSLO) 667 MG capsule Take 2 capsules (1,334 mg total) by mouth 3 (three) times daily with meals. 08/09/14   Karlene Einstein, MD    carvedilol (COREG) 25 MG tablet Take 1 tablet (25 mg total) by mouth 2 (two) times daily with a meal. 09/15/14   Renella Cunas, MD  famotidine (PEPCID) 20 MG tablet Take 1 tablet (20 mg total) by mouth daily. 09/15/14   Renella Cunas, MD  furosemide (LASIX) 80 MG tablet Take 2 tablets (160 mg total) by mouth 2 (two) times daily. 09/15/14   Renella Cunas, MD  glipiZIDE (GLUCOTROL) 10 MG tablet Take 1 tablet (10 mg total) by mouth 2 (two) times daily before a meal. 09/15/14   Renella Cunas, MD  hydrALAZINE (APRESOLINE) 50 MG tablet Take 1 tablet (50 mg total) by mouth 3 (three) times daily. 09/15/14   Renella Cunas, MD   Physical Exam: Filed Vitals:   11/29/14 1500  BP: 165/84  Pulse: 81  Temp:   Resp: 16     General: Well developed, well nourished, NAD, appears stated age  HEENT: NCAT, PERRLA, EOMI, Anicteic but pale Sclera, mucous membranes moist.   Neck: Supple, no JVD, no masses  Cardiovascular: S1 S2 auscultated, no rubs, murmurs or gallops. Regular rate and rhythm.  Respiratory: Clear to auscultation bilaterally with equal chest rise  Abdomen: Soft, nontender, nondistended, + bowel sounds  Extremities: warm dry without cyanosis clubbing. Trace LE edema B/L  Neuro: AAOx3, cranial nerves grossly intact. Strength 5/5 in patient's upper and lower extremities bilaterally  Skin: Without rashes exudates or nodules  Psych: Normal affect and demeanor with intact judgement and insight  Labs on Admission:  Basic Metabolic Panel:  Recent Labs Lab 11/29/14 1025  NA 141  K 4.1  CL 112  CO2 20  GLUCOSE 83  BUN 58*  CREATININE 6.78*  CALCIUM 8.7   Liver Function Tests: No results for input(s): AST, ALT, ALKPHOS, BILITOT, PROT, ALBUMIN in the last 168 hours. No results for input(s): LIPASE, AMYLASE in the last 168 hours. No results for input(s): AMMONIA in the last 168 hours. CBC:  Recent Labs Lab 11/29/14 1025  WBC 9.8  HGB 7.6*  HCT 23.2*  MCV 94.3  PLT 394    Cardiac Enzymes:  Recent Labs Lab 11/29/14 1025  TROPONINI 0.05*    BNP (last 3 results)  Recent Labs  09/14/14 1228  BNP 174.3*    ProBNP (last 3 results)  Recent Labs  01/12/14 1410 08/04/14 1007  PROBNP 13600.0* 52226.0*    CBG: No results for input(s): GLUCAP in the last 168 hours.  Radiological Exams on Admission: Dg Chest 2 View  11/29/2014   CLINICAL DATA:  Cough for 3 days, worse today.  EXAM: CHEST  2 VIEW  COMPARISON:  09/14/2014  FINDINGS: The heart size and mediastinal contours are within normal limits. Both lungs are clear. The visualized skeletal structures are unremarkable.  IMPRESSION: No active cardiopulmonary disease.   Electronically Signed   By: Kathreen Devoid   On: 11/29/2014 10:37    EKG: Independently reviewed. Sinus rhythm, rate 89  Assessment/Plan  Symptomatic Anemia -Patient will be admitted to telemetry -Complains of dizziness with some shortness of breath -Hemoglobin in February 2016 was 11.6 -Patient and appears to be between 9 and 10 -Patient does have history of chronic kidney  disease -Anemia panel 08/04/2014 showed an iron of 34, ferritin of 227 -EDP ordered 2 units PRBC to be transfused -Patient denies any dark or melanotic stools or bright red bloody bowel movement -Will continue to monitor CBC and order fecal occult  Chest pain/mild troponin elevation -Associated with patient's cough, resolves on its own -Patient does have history of diastolic heart failure -Patient has mild elevation in troponin 0.05, unlikely ACS as patient does have chronic kidney disease -Will continue to monitor and cycle troponins every 6 hours -Lipid profile in December 2012 shows total cholesterol 139, HDL 56, triglycerides 82, LDL 67 -TSH is noted to be 2.060 -Will place patient on low-dose aspirin -Continue Coreg  Cough -Likely secondary to upper respiratory infection versus bronchitis -Chest x-ray: No active cardiopulmonary disease -Patient  currently afebrile with no leukocytosis -EDP checking influenza PCR  Diabetes mellitus, type II -Last hemoglobin A1c 11/05/2014 was 6.1 -Continue glipizide  Diarrhea -C. difficile PCR pending -Patient currently afebrile no leukocytosis, has not been on any antibiotics recently -Will hold off on antibiotics  Chronic kidney disease, stage V -Creatinine appears to be at baseline -Patient denies follow-up with a nephrologist although does have an AV fistula in place -He has not started on dialysis -Will curbside nephrology  Chronic diastolic heart failure -Patient does not appear to be volume overloaded at this time -Monitor daily weights, intake and output -Continue home Lasix -BNP pending  Hypertension -Continue Coreg, Lasix, hydralazine  DVT prophylaxis: SCDs  Code Status: Full  Condition: Guarded  Family Communication: None at bedside. Admission, patients condition and plan of care including tests being ordered have been discussed with the patient, who indicates understanding and agrees with the plan and Code Status.  Disposition Plan: Admitted   Time spent: 60 minutes  Anais Denslow D.O. [facility] Pager [phone]  If 7PM-7AM, please contact night-coverage [email or web address] Password [facility] 11/29/2014, 3:19 PM  t

## 2014-11-29 NOTE — Progress Notes (Signed)
Notified Schorr, NP that pt requesting cough medication. NP put in order for tussionex x1. Will continue to monitor pt. Ranelle Oyster, RN

## 2014-11-29 NOTE — ED Notes (Signed)
Per EMS- Pt reports last night cough with chest wall pain, has been coughing up "fresh"blood. Coughs really hard and makes him have a fever. BP 162/90, HR 100.

## 2014-11-30 LAB — GLUCOSE, CAPILLARY
GLUCOSE-CAPILLARY: 66 mg/dL — AB (ref 70–99)
GLUCOSE-CAPILLARY: 144 mg/dL — AB (ref 70–99)
Glucose-Capillary: 52 mg/dL — ABNORMAL LOW (ref 70–99)
Glucose-Capillary: 86 mg/dL (ref 70–99)
Glucose-Capillary: 68 mg/dL — ABNORMAL LOW (ref 70–99)
Glucose-Capillary: 158 mg/dL — ABNORMAL HIGH (ref 70–99)
Glucose-Capillary: 138 mg/dL — ABNORMAL HIGH (ref 70–99)

## 2014-11-30 LAB — CBC
HCT: 27.8 % — ABNORMAL LOW (ref 39.0–52.0)
Hemoglobin: 9.1 g/dL — ABNORMAL LOW (ref 13.0–17.0)
MCH: 30.5 pg (ref 26.0–34.0)
MCHC: 32.7 g/dL (ref 30.0–36.0)
MCV: 93.3 fL (ref 78.0–100.0)
Platelets: 351 10*3/uL (ref 150–400)
RBC: 2.98 MIL/uL — ABNORMAL LOW (ref 4.22–5.81)
RDW: 18.1 % — AB (ref 11.5–15.5)
WBC: 9.5 10*3/uL (ref 4.0–10.5)

## 2014-11-30 LAB — BASIC METABOLIC PANEL
ANION GAP: 8 (ref 5–15)
BUN: 58 mg/dL — ABNORMAL HIGH (ref 6–23)
CALCIUM: 8.4 mg/dL (ref 8.4–10.5)
CO2: 20 mmol/L (ref 19–32)
CREATININE: 6.92 mg/dL — AB (ref 0.50–1.35)
Chloride: 110 mmol/L (ref 96–112)
GFR calc Af Amer: 9 mL/min — ABNORMAL LOW (ref >90)
GFR, EST NON AFRICAN AMERICAN: 8 mL/min — AB (ref >90)
GLUCOSE: 46 mg/dL — AB (ref 70–99)
Potassium: 4.1 mmol/L (ref 3.5–5.1)
Sodium: 138 mmol/L (ref 135–145)

## 2014-11-30 LAB — TYPE AND SCREEN
ABO/RH(D): O POS
Antibody Screen: NEGATIVE
UNIT DIVISION: 0
Unit division: 0

## 2014-11-30 LAB — OCCULT BLOOD X 1 CARD TO LAB, STOOL: Fecal Occult Bld: NEGATIVE

## 2014-11-30 LAB — TROPONIN I
TROPONIN I: 0.06 ng/mL — AB (ref <0.031)
TROPONIN I: 0.06 ng/mL — AB (ref <0.031)

## 2014-11-30 MED ORDER — GLIPIZIDE 5 MG PO TABS
5.0000 mg | ORAL_TABLET | Freq: Two times a day (BID) | ORAL | Status: DC
  Filled 2014-11-30 (×2): qty 1

## 2014-11-30 MED ORDER — GLUCAGON HCL RDNA (DIAGNOSTIC) 1 MG IJ SOLR
INTRAMUSCULAR | Status: AC
  Filled 2014-11-30: qty 1

## 2014-11-30 MED ORDER — DEXTROSE 50 % IV SOLN
INTRAVENOUS | Status: AC
  Filled 2014-11-30: qty 50

## 2014-11-30 MED ORDER — DEXTROSE 50 % IV SOLN
50.0000 mL | Freq: Once | INTRAVENOUS | Status: AC
  Administered 2014-11-30: 50 mL via INTRAVENOUS

## 2014-11-30 MED ORDER — HYDROCOD POLST-CHLORPHEN POLST 10-8 MG/5ML PO LQCR
5.0000 mL | Freq: Two times a day (BID) | ORAL | Status: DC | PRN
  Administered 2014-11-30: 5 mL via ORAL
  Filled 2014-11-30: qty 5

## 2014-11-30 MED ORDER — GLUCAGON HCL RDNA (DIAGNOSTIC) 1 MG IJ SOLR
1.0000 mg | Freq: Once | INTRAMUSCULAR | Status: AC
Start: 2014-11-30 — End: 2014-11-30
  Administered 2014-11-30: 1 mg via INTRAVENOUS

## 2014-11-30 MED ORDER — INSULIN ASPART 100 UNIT/ML ~~LOC~~ SOLN
0.0000 [IU] | Freq: Three times a day (TID) | SUBCUTANEOUS | Status: DC
  Administered 2014-11-30: 1 [IU] via SUBCUTANEOUS
  Administered 2014-12-01: 3 [IU] via SUBCUTANEOUS

## 2014-11-30 MED ORDER — DEXTROSE 50 % IV SOLN
25.0000 mL | Freq: Once | INTRAVENOUS | Status: AC
  Administered 2014-11-30: 25 mL via INTRAVENOUS

## 2014-11-30 NOTE — Progress Notes (Signed)
Hypoglycemic Event  CBG: 52  Treatment: 15 GM carbohydrate snack, 1/2amp D50  Symptoms: None  Follow-up CBG: BPPH:4327 CBG Result:86  Possible Reasons for Event: inadequate meal intake  Comments/MD notified:Dr. Ellan Lambert  Remember to initiate Hypoglycemia Order Set & complete

## 2014-11-30 NOTE — Progress Notes (Signed)
Patient Demographics  [name], is a 54 y.o. male, DOB - October 12, 1960, OVZ:[id number]  Admit date - 11/29/2014   Admitting Physician Cristal Ford, DO  Outpatient Primary MD for the patient is Jenell Milliner, MD  LOS - 1   Chief Complaint  Patient presents with  . Cough        Subjective:   [name] today has, No headache, No chest pain, No abdominal pain - No Nausea, No new weakness tingling or numbness, No Cough - SOB.    Assessment & Plan    1. Mild URI. Stable. Influenza panel negative, patient's symptom free without any shortness of breath or oxygen need. Supportive care for now only. Stop Tamiflu.   2. CK D stage V. Has a right arm AV fistula. Outpatient appointment with renal set for April 18, will be placed on renal diet, currently no shortness of breath, potassium stable, no nausea. Good appetite. No need for dialysis. The home dose Lasix  160 twice a day. Continue PhosLo and calcitriol.   3. Essential hypertension. On Coreg, Lasix, hydralazine and monitor.   4. Mildly elevated troponin. Due to #2 above, troponin rise not an ACS pattern, chest pain-free, EKG stable, on beta blocker, place low-dose aspirin, check echogram to evaluate wall motion and EF.   5. Anemia of chronic disease. Receive 2 units of packed RBC in the ER, anemia panel recently noted to have stable ferritin. Post transfusion H&H stable we'll continue to monitor. Continue Pepcid.   6. DM type II. Patient on Glucotrol twice a day. Had hypoglycemia. With worsening renal function will stop oral hypoglycemics and place on sliding scale, will get diabetic education.     Code Status: full  Family Communication: none  Disposition Plan: Home   Procedures   TTE  PRBC transfusion x  2   Consults Renal outpt appointment scheduled   Medications  Scheduled Meds: . calcitRIOL  0.25 mcg Oral Daily  . calcium acetate  1,334 mg Oral TID WC  . carvedilol  25 mg Oral BID WC  . dextrose      . famotidine  20 mg Oral Daily  . feeding supplement (ENSURE ENLIVE)  237 mL Oral BID BM  . furosemide  160 mg Oral BID  . guaiFENesin  1,200 mg Oral BID  . hydrALAZINE  50 mg Oral TID  . insulin aspart  0-9 Units Subcutaneous TID WC  . sodium chloride  3 mL Intravenous Q12H   Continuous Infusions:  PRN Meds:.acetaminophen **OR** [DISCONTINUED] acetaminophen, HYDROcodone-acetaminophen, morphine injection, ondansetron **OR** ondansetron (ZOFRAN) IV  DVT Prophylaxis    SCDs    Lab Results  Component Value Date   PLT 351 11/30/2014    Antibiotics     Anti-infectives    None          Objective:   Filed Vitals:   11/29/14 2321 11/30/14 0527 11/30/14 0834 11/30/14 1035  BP: 146/69 117/67 147/80 150/75  Pulse: 91  78   Temp: 99.3 F (37.4 C) 98 F (36.7 C)    TempSrc: Oral Oral    Resp: 20 20    Height:      Weight:      SpO2: 97% 95%      Wt  Readings from Last 3 Encounters:  11/29/14 87.907 kg (193 lb 12.8 oz)  11/16/14 88.451 kg (195 lb)  11/05/14 87.091 kg (192 lb)     Intake/Output Summary (Last 24 hours) at 11/30/14 1108 Last data filed at 11/30/14 1035  Gross per 24 hour  Intake   1371 ml  Output   2525 ml  Net  -1154 ml     Physical Exam  Awake Alert, Oriented X 3, No new F.N deficits, Normal affect [place].AT,PERRAL Supple Neck,No JVD, No cervical lymphadenopathy appriciated.  Symmetrical Chest wall movement, Good air movement bilaterally, CTAB RRR,No Gallops,Rubs or new Murmurs, No Parasternal Heave +ve B.Sounds, Abd Soft, No tenderness, No organomegaly appriciated, No rebound - guarding or rigidity. No Cyanosis, Clubbing or edema, No new Rash or bruise      Data Review   Micro Results No results found for this or any previous visit  (from the past 240 hour(s)).  Radiology Reports Dg Chest 2 View  11/29/2014   CLINICAL DATA:  Cough for 3 days, worse today.  EXAM: CHEST  2 VIEW  COMPARISON:  09/14/2014  FINDINGS: The heart size and mediastinal contours are within normal limits. Both lungs are clear. The visualized skeletal structures are unremarkable.  IMPRESSION: No active cardiopulmonary disease.   Electronically Signed   By: Kathreen Devoid   On: 11/29/2014 10:37     CBC  Recent Labs Lab 11/29/14 1025 11/30/14 0245  WBC 9.8 9.5  HGB 7.6* 9.1*  HCT 23.2* 27.8*  PLT 394 351  MCV 94.3 93.3  MCH 30.9 30.5  MCHC 32.8 32.7  RDW 15.9* 18.1*    Chemistries   Recent Labs Lab 11/29/14 1025 11/30/14 0245  NA 141 138  K 4.1 4.1  CL 112 110  CO2 20 20  GLUCOSE 83 46*  BUN 58* 58*  CREATININE 6.78* 6.92*  CALCIUM 8.7 8.4   ------------------------------------------------------------------------------------------------------------------ estimated creatinine clearance is 13.1 mL/min (by C-G formula based on Cr of 6.92). ------------------------------------------------------------------------------------------------------------------ No results for input(s): HGBA1C in the last 72 hours. ------------------------------------------------------------------------------------------------------------------ No results for input(s): CHOL, HDL, LDLCALC, TRIG, CHOLHDL, LDLDIRECT in the last 72 hours. ------------------------------------------------------------------------------------------------------------------ No results for input(s): TSH, T4TOTAL, T3FREE, THYROIDAB in the last 72 hours.  Invalid input(s): FREET3 ------------------------------------------------------------------------------------------------------------------ No results for input(s): VITAMINB12, FOLATE, FERRITIN, TIBC, IRON, RETICCTPCT in the last 72 hours.  Coagulation profile No results for input(s): INR, PROTIME in the last 168 hours.  No results  for input(s): DDIMER in the last 72 hours.  Cardiac Enzymes  Recent Labs Lab 11/29/14 1025 11/29/14 2340 11/30/14 0245  TROPONINI 0.05* 0.06* 0.06*   ------------------------------------------------------------------------------------------------------------------ Invalid input(s): POCBNP     Time Spent in minutes   35   SINGH,PRASHANT K M.D on 11/30/2014 at 11:08 AM  Between 7am to 7pm - Pager - [phone]  After 7pm go to [email or web address] - password [facility]  [facility]   Office  [phone]

## 2014-11-30 NOTE — Progress Notes (Signed)
Hypoglycemic Event  CBG: 68  Treatment: 15 GM carbohydrate snack, D50 IV 50 mL, Glucagon IV 1mg   Symptoms: none  Follow-up CBG: Time:1330 CBG Result:158  Possible Reasons for Event: Other: taking glipizide and inadequate meal intake  Comments/MD notified:Dr. Ellan Lambert  Remember to initiate Hypoglycemia Order Set & complete

## 2014-11-30 NOTE — Progress Notes (Addendum)
Inpatient Diabetes Program Recommendations  AACE/ADA: New Consensus Statement on Inpatient Glycemic Control (2013)  Target Ranges:  Prepandial:   less than 140 mg/dL      Peak postprandial:   less than 180 mg/dL (1-2 hours)      Critically ill patients:  140 - 180 mg/dL    Consult received requesting DM and insulin teaching.  It is my concern that insulin may cause hypoglycemia. Appears that she is making insulin based on the Glipizide causing continuous hypoglycemia.  Glipizide at 10 mg has half- life for at least 8-10 hours or longer with a patient with renal failure.  HgbA1C at 6.2%. With the hypoglycemia occuring Rather than using insuin at this point, would recommend Starlix (Meglitinide) at starting dose of 60 mg.-  Starlix is given only immediate following a meal. It's half-life is short lived-approximately  1.5 hrs. If pt cannot eat, she would not take the Starlix (oral med)decreases hypoglycemic events tremendously  Pt's with renal failure are often treated with Starlix due to short half-life and only given if the patient actually eats. It's dose can be increaesed as 120 mg/dL if needed.    Patient has had only low  glucose levels and is not requiring any correction scale. Will talk with patient and family if available. Will also order an dietician consult (Left note on yellow sticky note to refer to this note)  Thank you Rosita Kea, RN, MSN, CDE  Diabetes Inpatient Program Office: [phone] Pager: [phone] 8:00 am to 5:00 pm

## 2014-11-30 NOTE — Progress Notes (Addendum)
Nutrition Brief Note  Patient identified on the Malnutrition Screening Tool (MST) Report. RD also received consult for diet education.   Wt Readings from Last 15 Encounters:  11/29/14 193 lb 12.8 oz (87.907 kg)  11/16/14 195 lb (88.451 kg)  11/05/14 192 lb (87.091 kg)  10/07/14 195 lb (88.451 kg)  09/28/14 195 lb (88.451 kg)  09/15/14 191 lb (86.637 kg)  08/18/14 188 lb (85.276 kg)  08/12/14 170 lb (77.111 kg)  08/09/14 170 lb 10.2 oz (77.4 kg)  01/20/14 204 lb (92.534 kg)  09/10/13 189 lb 6 oz (85.9 kg)  09/06/11 200 lb (90.719 kg)  08/24/11 194 lb (28.786 kg)   [name] is a 54 y.o. male  With a history of diastolic heart failure, chronic kidney disease, stage 5,, diabetes mellitus, presented to the emergency department with complaints of cough for a few days, nonproductive. Patient also stated he has chest pain associated with his cough discussed approximately 20 minutes yesterday and resolved on its own. Patient denied any diaphoresis with this chest pain. He states overnight he did have some lightheadedness as well as sweating. Patient also states he's had one day of diarrhea  Pt reports intentional weight loss due to diet changes to control his diabetes. He reports that diabetes is not a new diagnosis for him and has been making positive changes to optimize glycemic control. Per chart review, Hgb A1c has ranged between 5.5-6.1 within the past year. Pt reports taking glucotrol PTA. Per DM coordinator, pt has had a few hypoglycemic episodes this admission and MD would would like to potentially start insulin.   Pt reports that his blood sugars were controlled well PTA. He admits that he ran out of strips for his glucometer and had not been checking as he should. When he was checking, results were WDL.   Pt has a very good understanding of DM diet principles. He reports he used to "weigh a lot", but has intentionally lost weight due to diet changes. He has been cutting back  on starches and eating more lean protein and vegetables. He has very good support from his fiance, who follows a general, healthy diet due to hx of HTN. Reviewed DM diet principles and provided AND's "Carhohydrate Counting for People With Diabetes" handout. Teachback method used.   Body mass index is 27.04 kg/(m^2). Patient meets criteria for overweight based on current BMI.   Current diet order is renal/carb modified with 1200 ml fluid restriction, patient is consuming approximately 85-100% of meals at this time. Labs and medications reviewed.   No nutrition interventions warranted at this time. If nutrition issues arise, please consult RD.   Susana Gripp A. Jimmye Norman, RD, LDN, CDE Pager: [phone] After hours Pager: [phone]

## 2014-11-30 NOTE — Progress Notes (Signed)
11/30/14 Spoke with patient about d/c plans, he is agreeable to appt with [facility]. He is concerned about paying for medications at d/c, explained MATCH program. Financial Counselor has contacted him and is to see him in the am. I contacted [facility] and made appt for Tuesday 12/07/14 at 3:30pm. Will f/u with patient on 12/01/14 for [place].

## 2014-12-01 LAB — BASIC METABOLIC PANEL
Anion gap: 8 (ref 5–15)
BUN: 66 mg/dL — ABNORMAL HIGH (ref 6–23)
CALCIUM: 8.7 mg/dL (ref 8.4–10.5)
CO2: 24 mmol/L (ref 19–32)
CREATININE: 7 mg/dL — AB (ref 0.50–1.35)
Chloride: 107 mmol/L (ref 96–112)
GFR calc non Af Amer: 8 mL/min — ABNORMAL LOW (ref >90)
GFR, EST AFRICAN AMERICAN: 9 mL/min — AB (ref >90)
Glucose, Bld: 100 mg/dL — ABNORMAL HIGH (ref 70–99)
POTASSIUM: 4.1 mmol/L (ref 3.5–5.1)
SODIUM: 139 mmol/L (ref 135–145)

## 2014-12-01 LAB — GLUCOSE, CAPILLARY
GLUCOSE-CAPILLARY: 106 mg/dL — AB (ref 70–99)
GLUCOSE-CAPILLARY: 212 mg/dL — AB (ref 70–99)
Glucose-Capillary: 215 mg/dL — ABNORMAL HIGH (ref 70–99)

## 2014-12-01 LAB — CBC
HEMATOCRIT: 27.7 % — AB (ref 39.0–52.0)
Hemoglobin: 8.9 g/dL — ABNORMAL LOW (ref 13.0–17.0)
MCH: 30 pg (ref 26.0–34.0)
MCHC: 32.1 g/dL (ref 30.0–36.0)
MCV: 93.3 fL (ref 78.0–100.0)
Platelets: 342 10*3/uL (ref 150–400)
RBC: 2.97 MIL/uL — ABNORMAL LOW (ref 4.22–5.81)
RDW: 18 % — ABNORMAL HIGH (ref 11.5–15.5)
WBC: 8.1 10*3/uL (ref 4.0–10.5)

## 2014-12-01 LAB — HEMOGLOBIN A1C
Hgb A1c MFr Bld: 5.8 % — ABNORMAL HIGH (ref 4.8–5.6)
MEAN PLASMA GLUCOSE: 120 mg/dL

## 2014-12-01 MED ORDER — MONTELUKAST SODIUM 10 MG PO TABS: 10.0000 mg | ORAL_TABLET | Freq: Every day | ORAL | Status: DC

## 2014-12-01 MED ORDER — FUROSEMIDE 10 MG/ML IJ SOLN
80.0000 mg | Freq: Once | INTRAMUSCULAR | Status: AC
  Administered 2014-12-01: 80 mg via INTRAVENOUS
  Filled 2014-12-01: qty 8

## 2014-12-01 MED ORDER — HYDROCOD POLST-CHLORPHEN POLST 10-8 MG/5ML PO LQCR: 5.0000 mL | Freq: Two times a day (BID) | ORAL | Status: DC | PRN

## 2014-12-01 MED ORDER — FAMOTIDINE 40 MG PO TABS: 40.0000 mg | ORAL_TABLET | Freq: Two times a day (BID) | ORAL | Status: DC

## 2014-12-01 MED ORDER — INSULIN STARTER KIT- SYRINGES (ENGLISH)
1.0000 | Freq: Once | Status: AC
  Administered 2014-12-01: 1
  Filled 2014-12-01: qty 1

## 2014-12-01 MED ORDER — ENSURE ENLIVE PO LIQD: 237.0000 mL | Freq: Two times a day (BID) | ORAL | Status: DC

## 2014-12-01 MED ORDER — INSULIN ASPART 100 UNIT/ML ~~LOC~~ SOLN: SUBCUTANEOUS | Status: DC

## 2014-12-01 MED ORDER — METOLAZONE 2.5 MG PO TABS
2.5000 mg | ORAL_TABLET | Freq: Once | ORAL | Status: AC
  Administered 2014-12-01: 2.5 mg via ORAL
  Filled 2014-12-01: qty 1

## 2014-12-01 MED ORDER — "INSULIN SYRINGE-NEEDLE U-100 25G X 1"" 1 ML MISC": Status: DC

## 2014-12-01 MED ORDER — FUROSEMIDE 80 MG PO TABS
160.0000 mg | ORAL_TABLET | Freq: Two times a day (BID) | ORAL | Status: DC
  Filled 2014-12-01: qty 2

## 2014-12-01 MED ORDER — FREESTYLE SYSTEM KIT: 1.0000 | PACK | Freq: Three times a day (TID) | Status: DC

## 2014-12-01 MED ORDER — LIVING WELL WITH DIABETES BOOK
Freq: Once | Status: AC
Start: 2014-12-01 — End: 2014-12-01
  Administered 2014-12-01: 12:00:00
  Filled 2014-12-01: qty 1

## 2014-12-01 NOTE — Progress Notes (Signed)
Patient complaining of feeling dizzy after getting up and using the bathroom. Patient also had complaints of headache and blurry vision. Orthostatic VS checked and stable. See flow sheet for VS. Patient asked if he felt this way when his blood sugar was low in past and he stated yes. CBG checked and resulted 215. Patient given prn tylenol for headache. Dr. Candiss Norse made aware and verbal order to have patient up and into chair and to discharge home after lunch.

## 2014-12-01 NOTE — Progress Notes (Addendum)
[name] to be D/C'd Home per MD order.  Discussed with the patient and all questions fully answered.  VSS, Skin clean, dry and intact without evidence of skin break down, no evidence of skin tears noted. IV catheter discontinued intact. Site without signs and symptoms of complications. Dressing and pressure applied.  An After Visit Summary was printed and given to the patient. Patient received prescription.  D/c education completed with patient/family including follow up instructions, medication list, d/c activities limitations if indicated, with other d/c instructions as indicated by MD - patient able to verbalize understanding, all questions fully answered. Patient demonstrated drawing up insulin and giving own injection for lunch time coverage. Patient provided with information on insulin, sliding scale, signs and symptoms of hypo/hyperglycemia, insulin needles and pens. Patient given handouts and used teach back.   Patient instructed to return to ED, call 911, or call MD for any changes in condition.   Patient escorted via [facility] and d/c via public transportation  [name], [name] 12/01/2014 11:43 AM

## 2014-12-01 NOTE — Discharge Instructions (Signed)
Follow with Primary MD Minerva Ends, MD in 7 days   Get CBC, CMP, 2 view Chest X ray checked  by Primary MD next visit.    Activity: As tolerated with Full fall precautions use walker/cane & assistance as needed   Disposition Home     Diet: Renal-low carbohydrate.  Check your Weight same time everyday, if you gain over 2 pounds, or you develop in leg swelling, experience more shortness of breath or chest pain, call your Primary MD immediately. Follow Cardiac Low Salt Diet and 1.5 lit/day fluid restriction.   On your next visit with your primary care physician please Get Medicines reviewed and adjusted.   Please request your Prim.MD to go over all Hospital Tests and Procedure/Radiological results at the follow up, please get all Hospital records sent to your Prim MD by signing hospital release before you go home.   If you experience worsening of your admission symptoms, develop shortness of breath, life threatening emergency, suicidal or homicidal thoughts you must seek medical attention immediately by calling 911 or calling your MD immediately  if symptoms less severe.  You Must read complete instructions/literature along with all the possible adverse reactions/side effects for all the Medicines you take and that have been prescribed to you. Take any new Medicines after you have completely understood and accpet all the possible adverse reactions/side effects.   Do not drive, operating heavy machinery, perform activities at heights, swimming or participation in water activities or provide baby sitting services if your were admitted for syncope or siezures until you have seen by Primary MD or a Neurologist and advised to do so again.  Do not drive when taking Pain medications.    Do not take more than prescribed Pain, Sleep and Anxiety Medications  Special Instructions: If you have smoked or chewed Tobacco  in the last 2 yrs please stop smoking, stop any regular Alcohol  and or  any Recreational drug use.  Wear Seat belts while driving.   Please note  You were cared for by a hospitalist during your hospital stay. If you have any questions about your discharge medications or the care you received while you were in the hospital after you are discharged, you can call the unit and asked to speak with the hospitalist on call if the hospitalist that took care of you is not available. Once you are discharged, your primary care physician will handle any further medical issues. Please note that NO REFILLS for any discharge medications will be authorized once you are discharged, as it is imperative that you return to your primary care physician (or establish a relationship with a primary care physician if you do not have one) for your aftercare needs so that they can reassess your need for medications and monitor your lab values.

## 2014-12-01 NOTE — Progress Notes (Signed)
12/01/14 Gave patient [place] letter, pharmacy list and appt info for follow up appt at [facility].  11/30/14 Spoke with patient about d/c plans, he is agreeable to appt with [facility]. He is concerned about paying for medications at d/c, explained MATCH program. Financial Counselor has contacted him and is to see him in the am. I contacted [facility] and made appt for Tuesday 12/07/14 at 3:30pm. Will f/u with patient on 12/01/14 for [place].

## 2014-12-01 NOTE — Discharge Summary (Addendum)
[name], is a 54 y.o. male  DOB 1960-10-16  MRN [id number].  Admission date:  11/29/2014  Admitting Physician  Cristal Ford, DO  Discharge Date:  12/01/2014   Primary MD  Minerva Ends, MD  Recommendations for primary care physician for things to follow:   Monitor anemia panel, CBC, BMP and glycemic control closely.   Will require close renal follow-up.   Admission Diagnosis  Bronchitis [J40] Chest pain, unspecified chest pain type [R07.9] Anemia, unspecified anemia type [D64.9]   Discharge Diagnosis  Bronchitis [J40] Chest pain, unspecified chest pain type [R07.9] Anemia, unspecified anemia type [D64.9]     Principal Problem:   Symptomatic anemia Active Problems:   Hypertension   Anemia of chronic renal failure, stage 4 (severe)   Chronic diastolic HF (heart failure), NYHA class 4   DM (diabetes mellitus), type 2, uncontrolled, with renal complications   GERD (gastroesophageal reflux disease)   Anemia      Past Medical History  Diagnosis Date  . PVD (peripheral vascular disease)   . Shortness of breath dyspnea   . Constipation   . Anemia     unsure  . CHF (congestive heart failure) 2015  . Diastolic congestive heart failure   . Diabetes mellitus 2015    type 2  . GERD (gastroesophageal reflux disease)     unsure  . Hypertension 2015  . Chronic renal disease, stage IV 2015  . Cocaine abuse     last used 3 month go   . Cellulitis and abscess of foot 08/24/2011    Past Surgical History  Procedure Laterality Date  . Leg surgery    . Knee surgery Right   . Amputation  09/06/2011    Procedure: AMPUTATION RAY;  Surgeon: Wylene Simmer, MD;  Location: [facility];  Service: Orthopedics;  Laterality: Left;  Left Hallux Amputation  . Av fistula placement Left 08/12/2014   Procedure: CREATION OF A BRACHIOCEPHALIC ARTERIOVENOUS (AV) FISTULA  LEFT ARM;  Surgeon: Mal Misty, MD;  Location: [facility];  Service: Vascular;  Laterality: Left;  . Av fistula placement Right 10/07/2014    Procedure: Creation Right Arm Arteriovenous fistula;  Surgeon: Mal Misty, MD;  Location: [facility];  Service: Vascular;  Laterality: Right;  . Appendectomy         History of present illness and  Hospital Course:     Kindly see H&P for history of present illness and admission details, please review complete Labs, Consult reports and Test reports for all details in brief  HPI  from the history and physical done on the day of admission  [name] is a 54 y.o. male with a history of Chr diastolic heart failure EF 55%, chronic kidney disease, stage 5,, diabetes mellitus, presented to the emergency department with complaints of cough for a few days, nonproductive. Patient also stated he has chest pain associated with his cough discussed approximately 20 minutes yesterday and resolved on its own. Patient denied any diaphoresis with this chest pain. He states  overnight he did have some lightheadedness as well as sweating. Patient also states he's had one day of diarrhea. Patient states that his 50-year-old daughter did have a cough approximately 1 week ago. In the ER, patient was found to have a hemoglobin of 7.6, chest x-ray was negative, UA was negative, troponin slightly elevated at 0.05. [facility] was called for admission and workup of anemia.    Hospital Course    1. Mild URI/Reflux/Allergies. Stable. Influenza panel negative, patient's symptom free without any shortness of breath or oxygen need. Supportive care for now only. This likely was combination of reflux with seasonal allergies. Have increased his PPI dose and added single lead. Chest x-ray was clear. Will be discharged home. No oxygen demand.   2. CK D stage V. Has a right arm AV fistula. Outpatient appointment with renal  set for April 18, will be placed on renal diet, currently no shortness of breath, potassium stable, no nausea. Good appetite. No need for dialysis. The home dose Lasix160 twice a day. Continue PhosLo and calcitriol.   3. Essential hypertension. On Coreg, Lasix, hydralazine and monitor.   4. Mildly elevated troponin. Due to #2 above, troponin rise not an ACS pattern, chest pain-free, EKG stable, on beta blocker, troponin rise was mild and consistent with underlying severe chronic renal insufficiency, his troponin was mildly elevated last admission as well. Echo reviewed from last admission which shows an EF of 50-55%. He will follow with his primary cardiologist post discharge.   5. Anemia of chronic disease. Receive 2 units of packed RBC in the ER, anemia panel recently noted to have stable ferritin. Post transfusion H&H stable we'll continue to monitor. Continue Pepcid. Request PCP to monitor anemia panel and H&H closely.   6. DM type II. Patient on Glucotrol twice a day. Had hypoglycemia. With worsening renal function will stop oral hypoglycemics and place on sliding scale, he got diabetic education. On sliding scale CBGs have been less than 150. Will be provided with glucometer and testing supplies as well. Long-acting insulin can be added based on his renal function and his future dialysis status. Likely will be placed on dialysis soon. For now the safest thing would be sliding scale insulin.  Lab Results  Component Value Date   HGBA1C 5.8* 11/30/2014   CBG (last 3)   Recent Labs  11/30/14 1659 11/30/14 2100 12/01/14 0757  GLUCAP 138* 144* 106*         Discharge Condition: Stable   Follow UP  Follow-up Information    Follow up with Windy Kalata, MD On 12/20/2014.   Specialty:  Nephrology   Why:  At 3:15pm   Contact information:   [place] [address] [phone]       Follow up with Jenell Milliner, MD. Go in 1 week.   Specialty:  Cardiology    Why:  Appointment Tuesday, April 5th at 3:30pm.    Contact information:   [address] [place] [address] [phone]         Discharge Instructions  and  Discharge Medications      Discharge Instructions    Discharge instructions    Complete by:  As directed   Follow with Primary MD Minerva Ends, MD in 7 days   Get CBC, CMP, 2 view Chest X ray checked  by Primary MD next visit.    Activity: As tolerated with Full fall precautions use walker/cane & assistance as needed   Disposition Home  Diet: Renal-low carbohydrate.  Check your Weight same time everyday, if you gain over 2 pounds, or you develop in leg swelling, experience more shortness of breath or chest pain, call your Primary MD immediately. Follow Cardiac Low Salt Diet and 1.5 lit/day fluid restriction.   On your next visit with your primary care physician please Get Medicines reviewed and adjusted.   Please request your Prim.MD to go over all Hospital Tests and Procedure/Radiological results at the follow up, please get all Hospital records sent to your Prim MD by signing hospital release before you go home.   If you experience worsening of your admission symptoms, develop shortness of breath, life threatening emergency, suicidal or homicidal thoughts you must seek medical attention immediately by calling 911 or calling your MD immediately  if symptoms less severe.  You Must read complete instructions/literature along with all the possible adverse reactions/side effects for all the Medicines you take and that have been prescribed to you. Take any new Medicines after you have completely understood and accpet all the possible adverse reactions/side effects.   Do not drive, operating heavy machinery, perform activities at heights, swimming or participation in water activities or provide baby sitting services if your were admitted for syncope or siezures until you have seen by Primary MD or a  Neurologist and advised to do so again.  Do not drive when taking Pain medications.    Do not take more than prescribed Pain, Sleep and Anxiety Medications  Special Instructions: If you have smoked or chewed Tobacco  in the last 2 yrs please stop smoking, stop any regular Alcohol  and or any Recreational drug use.  Wear Seat belts while driving.   Please note  You were cared for by a hospitalist during your hospital stay. If you have any questions about your discharge medications or the care you received while you were in the hospital after you are discharged, you can call the unit and asked to speak with the hospitalist on call if the hospitalist that took care of you is not available. Once you are discharged, your primary care physician will handle any further medical issues. Please note that NO REFILLS for any discharge medications will be authorized once you are discharged, as it is imperative that you return to your primary care physician (or establish a relationship with a primary care physician if you do not have one) for your aftercare needs so that they can reassess your need for medications and monitor your lab values.     Increase activity slowly    Complete by:  As directed             Medication List    STOP taking these medications        glipiZIDE 10 MG tablet  Commonly known as:  GLUCOTROL      TAKE these medications        calcitRIOL 0.25 MCG capsule  Commonly known as:  ROCALTROL  Take 1 capsule (0.25 mcg total) by mouth daily.     calcium acetate 667 MG capsule  Commonly known as:  PHOSLO  Take 2 capsules (1,334 mg total) by mouth 3 (three) times daily with meals.     carvedilol 25 MG tablet  Commonly known as:  COREG  Take 1 tablet (25 mg total) by mouth 2 (two) times daily with a meal.     chlorpheniramine-HYDROcodone 10-8 MG/5ML Lqcr  Commonly known as:  TUSSIONEX  Take 5 mLs by mouth every 12 (twelve)  hours as needed for cough.     famotidine 40  MG tablet  Commonly known as:  PEPCID  Take 1 tablet (40 mg total) by mouth 2 (two) times daily.     feeding supplement (ENSURE ENLIVE) Liqd  Take 237 mLs by mouth 2 (two) times daily between meals.     furosemide 80 MG tablet  Commonly known as:  LASIX  Take 2 tablets (160 mg total) by mouth 2 (two) times daily.     glucose monitoring kit monitoring kit  1 each by Does not apply route 4 (four) times daily - after meals and at bedtime. 1 month Diabetic Testing Supplies for QAC-QHS accuchecks.Any brand OK     hydrALAZINE 50 MG tablet  Commonly known as:  APRESOLINE  Take 1 tablet (50 mg total) by mouth 3 (three) times daily.     insulin aspart 100 UNIT/ML injection  Commonly known as:  NOVOLOG  Before each meal 3 times a day, 140-199 - 2 units, 200-250 - 4 units, 251-299 - 6 units,  300-349 - 8 units,  350 or above 10 units. Insulin PEN if approved, provide syringes and needles if needed. Can switch to any short-acting insulin.     Insulin Syringe-Needle U-100 25G X 1" 1 ML Misc  Any brand, for 4 times a day insulin SQ, 1 month supply.     montelukast 10 MG tablet  Commonly known as:  SINGULAIR  Take 1 tablet (10 mg total) by mouth at bedtime.          Diet and Activity recommendation: See Discharge Instructions above   Consults obtained - none   Major procedures and Radiology Reports - PLEASE review detailed and final reports for all details, in brief -       Dg Chest 2 View  11/29/2014   CLINICAL DATA:  Cough for 3 days, worse today.  EXAM: CHEST  2 VIEW  COMPARISON:  09/14/2014  FINDINGS: The heart size and mediastinal contours are within normal limits. Both lungs are clear. The visualized skeletal structures are unremarkable.  IMPRESSION: No active cardiopulmonary disease.   Electronically Signed   By: Kathreen Devoid   On: 11/29/2014 10:37    Micro Results      No results found for this or any previous visit (from the past 240 hour(s)).     Today    Subjective:   [name] today has no headache,no chest abdominal pain,no new weakness tingling or numbness, feels much better wants to go home today.    Objective:   Blood pressure 157/72, pulse 86, temperature 98.9 F (37.2 C), temperature source Oral, resp. rate 20, height 5' 11"  (1.803 m), weight 87.907 kg (193 lb 12.8 oz), SpO2 96 %.   Intake/Output Summary (Last 24 hours) at 12/01/14 1023 Last data filed at 12/01/14 0829  Gross per 24 hour  Intake    723 ml  Output   2475 ml  Net  -1752 ml    Exam Awake Alert, Oriented x 3, No new F.N deficits, Normal affect Diagonal.AT,PERRAL Supple Neck,No JVD, No cervical lymphadenopathy appriciated.  Symmetrical Chest wall movement, Good air movement bilaterally, CTAB RRR,No Gallops,Rubs or new Murmurs, No Parasternal Heave +ve B.Sounds, Abd Soft, Non tender, No organomegaly appriciated, No rebound -guarding or rigidity. No Cyanosis, Clubbing or edema, No new Rash or bruise  Data Review   CBC w Diff: Lab Results  Component Value Date   WBC 8.1 12/01/2014   HGB 8.9* 12/01/2014  HCT 27.7* 12/01/2014   PLT 342 12/01/2014   LYMPHOPCT 13 09/14/2014   MONOPCT 7 09/14/2014   EOSPCT 4 09/14/2014   BASOPCT 0 09/14/2014    CMP: Lab Results  Component Value Date   NA 139 12/01/2014   K 4.1 12/01/2014   CL 107 12/01/2014   CO2 24 12/01/2014   BUN 66* 12/01/2014   CREATININE 7.00* 12/01/2014   PROT 6.5 09/14/2014   ALBUMIN 2.6* 09/14/2014   BILITOT 0.5 09/14/2014   ALKPHOS 75 09/14/2014   AST 21 09/14/2014   ALT 16 09/14/2014  .   Total Time in preparing paper work, data evaluation and todays exam - 35 minutes  Thurnell Lose M.D on 12/01/2014 at 10:23 AM  [facility]   Office  [phone]

## 2014-12-01 NOTE — Progress Notes (Signed)
Spoke with patient regarding diabetes control and insulin. Patient is being discharged on Novolog insulin for correction. Patient reports that his grandmother had diabetes and he checked her glucose and gave her insulin shots in the past.  Educated on how to draw up and inject insulin with vial/syringe. Provided insulin starter kit for vial/syringes. Patient has already worked with his nurse in drawing up insulin and gave himself an insulin shot. Patient is able to verbalize and demonstrate proper technique for drawing up and administering insulin injection. Also explained how to use an insulin pen in case was ever started on an insulin pen. Patient instructed to check glucose 4 times per day (before meals and at bedtime) and keep a log of glucose readings and amount of insulin taken. Asked that he take the log with him to his follow up visit at the clinic. Patient has an appointment with the clinic for April 5th. Instructed patient to take prescription for Novolog, syringes, and glucometer to the pharmacy at the clinic once he is discharged to get everything he needs.Discussed normal glucose values, hyperglycemia,and hypoglycemia. Educated on treatment for hyperglycemia and hypoglycemia.  Patient verbalized understanding of information discussed and he states that he has no further questions at this time related to diabetes or insulin.

## 2014-12-07 ENCOUNTER — Ambulatory Visit: Payer: Medicaid Other | Attending: Family Medicine | Admitting: Family Medicine

## 2014-12-07 ENCOUNTER — Encounter: Payer: Self-pay | Admitting: Family Medicine

## 2014-12-07 VITALS — BP 137/78 | HR 93 | Temp 98.5°F | Resp 16 | Ht 71.0 in | Wt 183.0 lb

## 2014-12-07 DIAGNOSIS — N189 Chronic kidney disease, unspecified: Secondary | ICD-10-CM | POA: Insufficient documentation

## 2014-12-07 DIAGNOSIS — D649 Anemia, unspecified: Secondary | ICD-10-CM

## 2014-12-07 DIAGNOSIS — E1165 Type 2 diabetes mellitus with hyperglycemia: Secondary | ICD-10-CM | POA: Diagnosis not present

## 2014-12-07 DIAGNOSIS — J069 Acute upper respiratory infection, unspecified: Secondary | ICD-10-CM | POA: Insufficient documentation

## 2014-12-07 DIAGNOSIS — IMO0002 Reserved for concepts with insufficient information to code with codable children: Secondary | ICD-10-CM

## 2014-12-07 DIAGNOSIS — N184 Chronic kidney disease, stage 4 (severe): Secondary | ICD-10-CM | POA: Diagnosis not present

## 2014-12-07 DIAGNOSIS — I129 Hypertensive chronic kidney disease with stage 1 through stage 4 chronic kidney disease, or unspecified chronic kidney disease: Secondary | ICD-10-CM | POA: Diagnosis not present

## 2014-12-07 DIAGNOSIS — D631 Anemia in chronic kidney disease: Secondary | ICD-10-CM

## 2014-12-07 DIAGNOSIS — E1129 Type 2 diabetes mellitus with other diabetic kidney complication: Secondary | ICD-10-CM | POA: Diagnosis not present

## 2014-12-07 DIAGNOSIS — I10 Essential (primary) hypertension: Secondary | ICD-10-CM | POA: Diagnosis not present

## 2014-12-07 LAB — CBC
HCT: 32.9 % — ABNORMAL LOW (ref 39.0–52.0)
Hemoglobin: 10.6 g/dL — ABNORMAL LOW (ref 13.0–17.0)
MCH: 29.6 pg (ref 26.0–34.0)
MCHC: 32.2 g/dL (ref 30.0–36.0)
MCV: 91.9 fL (ref 78.0–100.0)
MPV: 9.2 fL (ref 8.6–12.4)
Platelets: 525 10*3/uL — ABNORMAL HIGH (ref 150–400)
RBC: 3.58 MIL/uL — AB (ref 4.22–5.81)
RDW: 16.8 % — ABNORMAL HIGH (ref 11.5–15.5)
WBC: 11.5 10*3/uL — ABNORMAL HIGH (ref 4.0–10.5)

## 2014-12-07 LAB — GLUCOSE, POCT (MANUAL RESULT ENTRY): POC GLUCOSE: 123 mg/dL — AB (ref 70–99)

## 2014-12-07 MED ORDER — HYDRALAZINE HCL 100 MG PO TABS: 100.0000 mg | ORAL_TABLET | Freq: Three times a day (TID) | ORAL | Status: DC

## 2014-12-07 NOTE — Assessment & Plan Note (Signed)
Anemia: CBC today

## 2014-12-07 NOTE — Assessment & Plan Note (Signed)
A: persistent CKD. Renal referral placed when he established with me on 11/05/14. Patient has appt next week. P: Patient to establish with nephrology.

## 2014-12-07 NOTE — Progress Notes (Signed)
HFU Anemia and URI

## 2014-12-07 NOTE — Patient Instructions (Addendum)
[name],  1. Chronic kidney disease: I placed a renal referral when we met on 11/05/14. Please keep set appt with renal.  2. HTN; Goal BP < 130/80 Increase hydralazine to 100 mg three times daily   3. Diabetes:  Sugars are doing well Continue to monitor and sliding scale novolo.  Goal fasting 90-130 Goal after meals < 200 No low sugars   Check fasting and 2 hrs after two of your larger meals.   4. Anemia: CBC today.   You will be called with lab results   F/u in 1 week with Dr. Verl Blalock  F/u in 4 weeks with RN for BP and CBG check  F/u in 3 months with me   Dr. Adrian Blackwater

## 2014-12-07 NOTE — Progress Notes (Signed)
   Subjective:    Patient ID: [name], male    DOB: 1961/05/10, 54 y.o.   MRN: [id number] CC: ED f/u URI HPI  1. Cough: congestion and dry cough since recent hospitalization for symptomatic anemia from 3/28-3/30/16. No fever, chills, CP, SOB.   2. CHRONIC HYPERTENSION  Disease Monitoring  Blood pressure range: not check at home   Chest pain: no   Dyspnea: no   Claudication: no   Medication compliance: yes  Medication Side Effects  Lightheadedness: no   Urinary frequency: no   Edema: no    3. CKD: Cr up to 7. Glipizide stopped due to hypoglycemia. Patient has an appt with renal in two weeks. Monitor CBGs and they are doing well, not needing novolog.    Soc Hx: non smoker  Review of Systems     Objective:   Physical Exam BP 137/78 mmHg  Pulse 93  Temp(Src) 98.5 F (36.9 C) (Oral)  Resp 16  Ht 5\' 11"  (1.803 m)  Wt 183 lb (83.008 kg)  BMI 25.53 kg/m2  SpO2 99% General appearance: alert, cooperative and no distress Nose: Nares normal. Septum midline. Mucosa normal. No drainage or sinus tenderness., turbinates pink, swollen on the L  Throat: lips, mucosa, and tongue normal; teeth and gums normal Neck: no adenopathy, supple, symmetrical, trachea midline and thyroid not enlarged, symmetric, no tenderness/mass/nodules Lungs: clear to auscultation bilaterally Heart: regular rate and rhythm, S1, S2 normal, no murmur, click, rub or gallop   Lab Results  Component Value Date   HGBA1C 5.8* 11/30/2014   CBG 123        Assessment & Plan:

## 2014-12-07 NOTE — Assessment & Plan Note (Signed)
Diabetes:  Sugars are doing well Continue to monitor and sliding scale novolo.  Goal fasting 90-130 Goal after meals < 200 No low sugars   Check fasting and 2 hrs after two of your larger meals.

## 2014-12-07 NOTE — Assessment & Plan Note (Signed)
HTN: Goal BP < 130/80 Increase hydralazine to 100 mg three times daily

## 2014-12-15 ENCOUNTER — Ambulatory Visit: Payer: Medicaid Other | Attending: Cardiology | Admitting: Cardiology

## 2014-12-15 ENCOUNTER — Encounter: Payer: Self-pay | Admitting: Cardiology

## 2014-12-15 ENCOUNTER — Other Ambulatory Visit: Payer: Self-pay | Admitting: Internal Medicine

## 2014-12-15 VITALS — BP 179/92 | HR 110 | Temp 98.0°F | Resp 18 | Ht 71.0 in | Wt 189.4 lb

## 2014-12-15 DIAGNOSIS — N2889 Other specified disorders of kidney and ureter: Secondary | ICD-10-CM

## 2014-12-15 DIAGNOSIS — E119 Type 2 diabetes mellitus without complications: Secondary | ICD-10-CM | POA: Diagnosis not present

## 2014-12-15 DIAGNOSIS — I5032 Chronic diastolic (congestive) heart failure: Secondary | ICD-10-CM | POA: Diagnosis present

## 2014-12-15 DIAGNOSIS — I151 Hypertension secondary to other renal disorders: Secondary | ICD-10-CM

## 2014-12-15 DIAGNOSIS — I120 Hypertensive chronic kidney disease with stage 5 chronic kidney disease or end stage renal disease: Secondary | ICD-10-CM | POA: Insufficient documentation

## 2014-12-15 DIAGNOSIS — N186 End stage renal disease: Secondary | ICD-10-CM | POA: Insufficient documentation

## 2014-12-15 DIAGNOSIS — Z992 Dependence on renal dialysis: Secondary | ICD-10-CM | POA: Insufficient documentation

## 2014-12-15 MED ORDER — CALCIUM ACETATE 667 MG PO CAPS
1334.0000 mg | ORAL_CAPSULE | Freq: Three times a day (TID) | ORAL | Status: DC
Start: 2014-12-15 — End: 2016-07-20

## 2014-12-15 NOTE — Progress Notes (Signed)
[name] turns today for close follow-up of his chronic diastolic heart failure. Since I last evaluated him, he has had a right antecubital AV fistula placed. He is to visit Dr. Mercy Moore of renal next week. He still urinates quite a bit with high-dose Lasix. He denies any orthopnea or PND. He denies any increased edema.  He did fall on his chest recently. He probably bruised some ribs. He refused to go to urgent care. He denies any hemoptysis or pleuritic chest pain.  His exam today is unchanged. He has minimal JVD. Lungs reveal a few adventitial sounds in the bases but no crackles or rales. Heart reveals a nondisplaced PMI. Normal S1-S2 without gallop. Abdominal exam is soft there is no pattern megaly of the sternum reveal 1+ pretibial edema at most. Pulses were present. He has a nice bruit palpable pulse and flow of the right antecubital AV fistula.

## 2014-12-15 NOTE — Progress Notes (Signed)
Patient here for 3 month follow up. Reports having chest pain on and off. Patient fell last week, landed on chest. Has been taking muscle relaxer to help with pain. Patients last EKG on 11/29/14 reported as "stable". AV fistula placed in right arm on 10/07/14. Chest xray 11/19/14 reported "heart within normal limits, no active cardiopulmonary disease".

## 2014-12-15 NOTE — Assessment & Plan Note (Signed)
He is currently stable. Blood pressure is suboptimally controlled and his heart rate is over 100. This is most likely secondary to volume overload which seems to be fairly stable and static at present. Continue current medications including high-dose Lasix. He will see Dr. Mercy Moore next week. His AV fistula seems to be doing well based on exam.

## 2014-12-15 NOTE — Assessment & Plan Note (Signed)
Poorly controlled but his usual numbers. Dialysis will help this. I have made no changes in his medications at this time. The hydralazine was recently increased to 100 mg 3 times a day.

## 2014-12-21 ENCOUNTER — Telehealth: Payer: Self-pay

## 2014-12-21 NOTE — Telephone Encounter (Signed)
-----   Message from Ena Dawley sent at 12/19/2014 12:04 PM EDT ----- Regarding: RE: Eye Dr Pt has an appointment at [facility] 01/03/15 @ 2:30pm  Ph# [phone] address [place].  Mailed a letter to patient with appointment details. Thank you  ----- Message -----    From: Nila Nephew, RN    Sent: 12/15/2014   2:52 PM      To: Ena Dawley Subject: Eye Dr                                         Patient is here to see Dr. Verl Blalock today and has a letter that you sent to him. He says the letter tells him to go to [facility] to have an eye exam and he explains he does not have money for the exam. He says he has medicaid and would like to be referred to a place that accepts medicaid.

## 2014-12-21 NOTE — Telephone Encounter (Signed)
Nurse left message for patient to return call, only left name of nurse and contact information. Need to give patient message about upcoming eye appointment at [facility].

## 2014-12-22 ENCOUNTER — Telehealth: Payer: Self-pay | Admitting: Family Medicine

## 2014-12-22 ENCOUNTER — Telehealth: Payer: Self-pay

## 2014-12-22 NOTE — Telephone Encounter (Signed)
Called returned to patient. Patient called inquiring about lasix.  Noted patient prescribed Lasix 80 mg bid by Dr. Verl Blalock. Lasix refilled on 09/15/14; patient has 11 refills on script.  Patient indicates he was informed by his "kidney doctor" to take lasix 80 mg three times daily.  Patient instructed to call their office and have new script sent to pharmacy if dosage changed.  Patient indicates he has contacted their office and will take Lasix as prescribed.  No refills ordered as patient has contacted Dr. Etheleen Nicks office already since MD changed dosage at last appointment per patient.  No other needs identified.

## 2014-12-22 NOTE — Telephone Encounter (Signed)
Patient is returning call from nurse, please f/u with pt.

## 2014-12-22 NOTE — Telephone Encounter (Signed)
Patient aware of eye appointment at [facility] on Jan 03, 2015. Patient given phone number and appointment information for Gershon Crane. Patient voices understanding of appointment.

## 2014-12-22 NOTE — Telephone Encounter (Signed)
Patient has called in today to inform the PCP and Cardiologist that he has a child support hearing coming up on Thursday Jan 06, 2015; Patient is requesting a collaborative letter from both the cardiologist and PCP detailing his medical condition and his ability to work a normal job; please f/u with patient about this request so he can provide his representation with this information;

## 2014-12-27 NOTE — Telephone Encounter (Signed)
    Expand All Collapse All   Patient has called in today to inform the PCP and Cardiologist that he has a child support hearing coming up on Thursday Jan 06, 2015; Patient is requesting a collaborative letter from both the cardiologist and PCP detailing his medical condition and his ability to work a normal job; please f/u with patient about this request so he can provide his representation with this information;        Patient would like letter to be sent to [place] fax number is [phone] if approved. Please f/u with patient

## 2014-12-28 ENCOUNTER — Encounter: Payer: Self-pay | Admitting: Family Medicine

## 2014-12-28 ENCOUNTER — Other Ambulatory Visit: Payer: Self-pay | Admitting: Family Medicine

## 2014-12-28 DIAGNOSIS — N184 Chronic kidney disease, stage 4 (severe): Secondary | ICD-10-CM

## 2014-12-28 DIAGNOSIS — D472 Monoclonal gammopathy: Secondary | ICD-10-CM | POA: Insufficient documentation

## 2014-12-28 MED ORDER — FUROSEMIDE 80 MG PO TABS: 160.0000 mg | ORAL_TABLET | Freq: Three times a day (TID) | ORAL | Status: DC

## 2014-12-28 NOTE — Telephone Encounter (Signed)
Please call patient  Letter will be written by 01/05/2015, the standard two weeks allowed for letters or form completion.  Patient will be called when letter ready for pick up

## 2014-12-29 ENCOUNTER — Encounter: Payer: Self-pay | Admitting: Cardiology

## 2014-12-29 NOTE — Telephone Encounter (Signed)
Dr. Verl Blalock completed requested letter. Patient notified that letter ready for pick-up at front desk.  Patient appreciative of information.  Patient also informed that Dr. Adrian Blackwater will have letter written by 01/05/15, and he will be called when that letter ready for pick-up.  Patient verbalized understanding.

## 2015-01-04 ENCOUNTER — Ambulatory Visit: Payer: Medicaid Other | Attending: Family Medicine | Admitting: *Deleted

## 2015-01-04 ENCOUNTER — Ambulatory Visit (HOSPITAL_BASED_OUTPATIENT_CLINIC_OR_DEPARTMENT_OTHER): Payer: Medicaid Other | Admitting: Family Medicine

## 2015-01-04 VITALS — BP 124/61 | HR 76 | Temp 98.1°F | Resp 16

## 2015-01-04 DIAGNOSIS — M25552 Pain in left hip: Secondary | ICD-10-CM

## 2015-01-04 DIAGNOSIS — E119 Type 2 diabetes mellitus without complications: Secondary | ICD-10-CM | POA: Insufficient documentation

## 2015-01-04 DIAGNOSIS — I2510 Atherosclerotic heart disease of native coronary artery without angina pectoris: Secondary | ICD-10-CM | POA: Diagnosis not present

## 2015-01-04 DIAGNOSIS — I739 Peripheral vascular disease, unspecified: Secondary | ICD-10-CM | POA: Diagnosis not present

## 2015-01-04 DIAGNOSIS — E1165 Type 2 diabetes mellitus with hyperglycemia: Secondary | ICD-10-CM

## 2015-01-04 DIAGNOSIS — I129 Hypertensive chronic kidney disease with stage 1 through stage 4 chronic kidney disease, or unspecified chronic kidney disease: Secondary | ICD-10-CM | POA: Insufficient documentation

## 2015-01-04 DIAGNOSIS — N189 Chronic kidney disease, unspecified: Secondary | ICD-10-CM | POA: Diagnosis not present

## 2015-01-04 DIAGNOSIS — I5032 Chronic diastolic (congestive) heart failure: Secondary | ICD-10-CM | POA: Diagnosis not present

## 2015-01-04 LAB — GLUCOSE, POCT (MANUAL RESULT ENTRY): POC GLUCOSE: 171 mg/dL — AB (ref 70–99)

## 2015-01-04 MED ORDER — ACETAMINOPHEN-CODEINE #3 300-30 MG PO TABS: 1.0000 | ORAL_TABLET | Freq: Four times a day (QID) | ORAL | Status: DC | PRN

## 2015-01-04 NOTE — Progress Notes (Signed)
Patient presents for BP check, CBG and record review for T2DM, however, patient did not bring log or meter Patient not checking BS even though sliding scale insulin ordered Pt given blood sugar log and instructed on use. Instructed to bring to all future visits. Patient states he has all supplies needed to check BS and will begin checking AC tid Med list reviewed; patient reports taking all meds as directed except insulin Has increased thirst and urination; patient taking lasix 160 mg tid Denies edema or [facility] Patient has AV fistula present right arm for future dialysis  C/o left hip pain X 4-5 days with no relief from ben gay or "joint cream"; rates 4-5/10 at present, increases to 7-8/10 at bedtime  CBG 171 2 hours after [place] [employer]  Lab Results  Component Value Date   HGBA1C 5.8* 11/30/2014   Filed Vitals:   01/04/15 1455  BP: 124/61  Pulse: 76  Temp: 98.1 F (36.7 C)  Resp: 16     Per PCP: Check BS for next 2 weeks and return for nurse visit for C[facility] record review.  If BS at goal will not need to continue insulin  Patient advised to call for med refills at least 7 days before running out so as not to go without.  Patient to return in 2 weeks for nurse visit for BP check, CBG and record review  Patient aware that he is to f/u with PCP 3 months from last visit. Due 02/05/2015  Patient declined need for educational materials at this time  Patient to be seen today by NP for left hip pain

## 2015-01-04 NOTE — Patient Instructions (Signed)
Avoid a lot of walking for a few days. Use Tylenol # 3 1-every 6 hours as needed for pain. This is a very mild narcotic and may cause drowsiness May go this afternoon or tomorrow to get x-ray We will let you know the results when we get them

## 2015-01-04 NOTE — Progress Notes (Signed)
Subjective:     Patient ID: [name], male   DOB: 04/29/1961, 54 y.o.   MRN: [id number]  HPI Patient here today for follow-up of chronic conditions with nurse. He complains of hip pain for 4-5 days. He is not aware of an injury. He has not had this pain in the hip previously. He did fall recently and bruised his right rib cage. He denies that this is causing any change in his gait. He has tried [place] and another arthritis cream with no relief. He has a history of CAD, hypertension, CKD, chronic diastolic heart failure, PVD. Due to these diagnosis, I am reluctant to prescribe NSAIDS. His blood pressure and BS are stable today.   Review of Systems   General: Denies fever, chills Lungs:  Denies current shortness of breath or coughing Heart:  Denies current chest pain or palpitations Extremeties: Denies significant musculoskeletal pain except for left hip     Objective:   Physical Exam   General:  He is well-developed, appears well-nourshished, skin is warm and dry. Lungs:    No crackles, wheezes or stridor Heart:    HS are regular w/o m,g,r. Extremeties:  There is pain with walking and with flexion and internal rotation of left hip. Strength is equal bilaterally.. There is tenderness to palpation over the    Left hip joint.     Assessment:     1.  Left hip pain    Plan:     1. X-ray of hip ordered. He may go today or tomorrow.     Tylenol #54m #30, one po 6 hours for moderate pain. Due to chronic conditions including CKD, hypertension, diabetes and CHF am avoiding NSAIDS  Follow-up:  Will call with x-ray results when received and discuss follow-up. Should follow-up as planned with PCP and Dr. Verl Blalock.    Micheline Chapman, FNP-BC.

## 2015-01-05 ENCOUNTER — Encounter: Payer: Self-pay | Admitting: Family Medicine

## 2015-01-14 ENCOUNTER — Telehealth: Payer: Self-pay | Admitting: Hematology and Oncology

## 2015-01-14 NOTE — Telephone Encounter (Signed)
NEW PT REFERRAL-LEFT  MESSAGE FOR PATIENT SON [name] TO RETURN CALL

## 2015-01-20 ENCOUNTER — Telehealth: Payer: Self-pay | Admitting: *Deleted

## 2015-01-20 NOTE — Telephone Encounter (Signed)
RN Director at [facility] at [facility] called and left message that patient had called her confused.  He had received an appointment reminder card in the mail that he had an appointment.  Patient very confused because he does not think he has cancer and is wanting to know what this is about.

## 2015-01-21 NOTE — Telephone Encounter (Signed)
Please call back to patient. I have not referred patient to oncology and have no idea why they area scheduling a new patient appt Please call to oncology as well to clarify. Referral I have placed are: renal, opthalmology and gastroenterology back in March.

## 2015-01-24 ENCOUNTER — Telehealth: Payer: Self-pay | Admitting: Hematology and Oncology

## 2015-01-24 NOTE — Telephone Encounter (Signed)
Received phone call from patient regarding new patient packet sent from the [facility].  Patient was unaware of why he had been referred.  No notes from any of his current providers documented that the patient would be referred to the [facility].  New patient referral had Dr. [name] as the referring physician.  [facility] to let them know this patient is questioning his referral to the [facility].  A call was received from Vicente Males with the [facility] stating that this must have been a mistake that no one had referred this patient, and to please make sure that appointment was cancelled.

## 2015-01-26 ENCOUNTER — Encounter (HOSPITAL_COMMUNITY): Payer: Self-pay

## 2015-01-26 ENCOUNTER — Ambulatory Visit: Payer: Self-pay

## 2015-01-26 ENCOUNTER — Emergency Department (HOSPITAL_COMMUNITY): Payer: Medicaid Other

## 2015-01-26 ENCOUNTER — Ambulatory Visit: Payer: Self-pay | Admitting: Hematology and Oncology

## 2015-01-26 ENCOUNTER — Inpatient Hospital Stay (HOSPITAL_COMMUNITY)
Admission: EM | Admit: 2015-01-26 | Discharge: 2015-02-03 | DRG: 291 | Disposition: A | Discharge status: Home / Self Care | Payer: Medicaid Other | Attending: Internal Medicine | Admitting: Internal Medicine

## 2015-01-26 DIAGNOSIS — F419 Anxiety disorder, unspecified: Secondary | ICD-10-CM | POA: Diagnosis present

## 2015-01-26 DIAGNOSIS — IMO0001 Reserved for inherently not codable concepts without codable children: Secondary | ICD-10-CM | POA: Diagnosis present

## 2015-01-26 DIAGNOSIS — Z89412 Acquired absence of left great toe: Secondary | ICD-10-CM | POA: Diagnosis not present

## 2015-01-26 DIAGNOSIS — E1122 Type 2 diabetes mellitus with diabetic chronic kidney disease: Secondary | ICD-10-CM | POA: Diagnosis present

## 2015-01-26 DIAGNOSIS — I5033 Acute on chronic diastolic (congestive) heart failure: Principal | ICD-10-CM | POA: Diagnosis present

## 2015-01-26 DIAGNOSIS — Z9119 Patient's noncompliance with other medical treatment and regimen: Secondary | ICD-10-CM | POA: Diagnosis present

## 2015-01-26 DIAGNOSIS — D631 Anemia in chronic kidney disease: Secondary | ICD-10-CM | POA: Diagnosis present

## 2015-01-26 DIAGNOSIS — N186 End stage renal disease: Secondary | ICD-10-CM | POA: Diagnosis present

## 2015-01-26 DIAGNOSIS — E1151 Type 2 diabetes mellitus with diabetic peripheral angiopathy without gangrene: Secondary | ICD-10-CM | POA: Diagnosis present

## 2015-01-26 DIAGNOSIS — Z6825 Body mass index (BMI) 25.0-25.9, adult: Secondary | ICD-10-CM

## 2015-01-26 DIAGNOSIS — I120 Hypertensive chronic kidney disease with stage 5 chronic kidney disease or end stage renal disease: Secondary | ICD-10-CM | POA: Diagnosis present

## 2015-01-26 DIAGNOSIS — N19 Unspecified kidney failure: Secondary | ICD-10-CM

## 2015-01-26 DIAGNOSIS — E46 Unspecified protein-calorie malnutrition: Secondary | ICD-10-CM | POA: Diagnosis present

## 2015-01-26 DIAGNOSIS — R778 Other specified abnormalities of plasma proteins: Secondary | ICD-10-CM

## 2015-01-26 DIAGNOSIS — K920 Hematemesis: Secondary | ICD-10-CM | POA: Insufficient documentation

## 2015-01-26 DIAGNOSIS — F1994 Other psychoactive substance use, unspecified with psychoactive substance-induced mood disorder: Secondary | ICD-10-CM | POA: Diagnosis not present

## 2015-01-26 DIAGNOSIS — E611 Iron deficiency: Secondary | ICD-10-CM | POA: Diagnosis present

## 2015-01-26 DIAGNOSIS — I10 Essential (primary) hypertension: Secondary | ICD-10-CM | POA: Diagnosis present

## 2015-01-26 DIAGNOSIS — F1490 Cocaine use, unspecified, uncomplicated: Secondary | ICD-10-CM

## 2015-01-26 DIAGNOSIS — F14129 Cocaine abuse with intoxication, unspecified: Secondary | ICD-10-CM | POA: Diagnosis present

## 2015-01-26 DIAGNOSIS — E1129 Type 2 diabetes mellitus with other diabetic kidney complication: Secondary | ICD-10-CM

## 2015-01-26 DIAGNOSIS — N2581 Secondary hyperparathyroidism of renal origin: Secondary | ICD-10-CM | POA: Diagnosis present

## 2015-01-26 DIAGNOSIS — I509 Heart failure, unspecified: Secondary | ICD-10-CM

## 2015-01-26 DIAGNOSIS — E785 Hyperlipidemia, unspecified: Secondary | ICD-10-CM | POA: Diagnosis present

## 2015-01-26 DIAGNOSIS — R079 Chest pain, unspecified: Secondary | ICD-10-CM

## 2015-01-26 DIAGNOSIS — F329 Major depressive disorder, single episode, unspecified: Secondary | ICD-10-CM | POA: Diagnosis present

## 2015-01-26 DIAGNOSIS — F1914 Other psychoactive substance abuse with psychoactive substance-induced mood disorder: Secondary | ICD-10-CM | POA: Diagnosis present

## 2015-01-26 DIAGNOSIS — Z794 Long term (current) use of insulin: Secondary | ICD-10-CM

## 2015-01-26 DIAGNOSIS — F1110 Opioid abuse, uncomplicated: Secondary | ICD-10-CM | POA: Diagnosis present

## 2015-01-26 DIAGNOSIS — E669 Obesity, unspecified: Secondary | ICD-10-CM | POA: Diagnosis present

## 2015-01-26 DIAGNOSIS — R0602 Shortness of breath: Secondary | ICD-10-CM | POA: Diagnosis present

## 2015-01-26 DIAGNOSIS — N184 Chronic kidney disease, stage 4 (severe): Secondary | ICD-10-CM | POA: Diagnosis not present

## 2015-01-26 DIAGNOSIS — D472 Monoclonal gammopathy: Secondary | ICD-10-CM | POA: Diagnosis present

## 2015-01-26 DIAGNOSIS — K219 Gastro-esophageal reflux disease without esophagitis: Secondary | ICD-10-CM | POA: Diagnosis present

## 2015-01-26 DIAGNOSIS — I5032 Chronic diastolic (congestive) heart failure: Secondary | ICD-10-CM | POA: Diagnosis present

## 2015-01-26 DIAGNOSIS — F0630 Mood disorder due to known physiological condition, unspecified: Secondary | ICD-10-CM | POA: Diagnosis not present

## 2015-01-26 DIAGNOSIS — R7989 Other specified abnormal findings of blood chemistry: Secondary | ICD-10-CM

## 2015-01-26 HISTORY — DX: Unspecified protein-calorie malnutrition: E46

## 2015-01-26 LAB — COMPREHENSIVE METABOLIC PANEL
ALK PHOS: 225 U/L — AB (ref 38–126)
ALT: 51 U/L (ref 17–63)
ANION GAP: 11 (ref 5–15)
AST: 40 U/L (ref 15–41)
Albumin: 2.5 g/dL — ABNORMAL LOW (ref 3.5–5.0)
BILIRUBIN TOTAL: 1.6 mg/dL — AB (ref 0.3–1.2)
BUN: 63 mg/dL — AB (ref 6–20)
CO2: 16 mmol/L — AB (ref 22–32)
Calcium: 8.8 mg/dL — ABNORMAL LOW (ref 8.9–10.3)
Chloride: 107 mmol/L (ref 101–111)
Creatinine, Ser: 5.96 mg/dL — ABNORMAL HIGH (ref 0.61–1.24)
GFR calc non Af Amer: 10 mL/min — ABNORMAL LOW (ref >60)
GFR, EST AFRICAN AMERICAN: 11 mL/min — AB (ref >60)
Glucose, Bld: 156 mg/dL — ABNORMAL HIGH (ref 65–99)
Potassium: 4.4 mmol/L (ref 3.5–5.1)
Sodium: 134 mmol/L — ABNORMAL LOW (ref 135–145)
Total Protein: 6.9 g/dL (ref 6.5–8.1)

## 2015-01-26 LAB — RAPID URINE DRUG SCREEN, HOSP PERFORMED
Amphetamines: NOT DETECTED
BENZODIAZEPINES: NOT DETECTED
Barbiturates: NOT DETECTED
COCAINE: POSITIVE — AB
OPIATES: POSITIVE — AB
TETRAHYDROCANNABINOL: NOT DETECTED

## 2015-01-26 LAB — CBC
HCT: 24.7 % — ABNORMAL LOW (ref 39.0–52.0)
Hemoglobin: 8.3 g/dL — ABNORMAL LOW (ref 13.0–17.0)
MCH: 30.9 pg (ref 26.0–34.0)
MCHC: 33.6 g/dL (ref 30.0–36.0)
MCV: 91.8 fL (ref 78.0–100.0)
Platelets: 397 10*3/uL (ref 150–400)
RBC: 2.69 MIL/uL — AB (ref 4.22–5.81)
RDW: 15.5 % (ref 11.5–15.5)
WBC: 11.8 10*3/uL — ABNORMAL HIGH (ref 4.0–10.5)

## 2015-01-26 LAB — BRAIN NATRIURETIC PEPTIDE: B Natriuretic Peptide: 2258.1 pg/mL — ABNORMAL HIGH (ref 0.0–100.0)

## 2015-01-26 LAB — POC OCCULT BLOOD, ED: Fecal Occult Bld: NEGATIVE

## 2015-01-26 LAB — I-STAT TROPONIN, ED: Troponin i, poc: 0.1 ng/mL (ref 0.00–0.08)

## 2015-01-26 LAB — ETHANOL

## 2015-01-26 LAB — GLUCOSE, CAPILLARY: Glucose-Capillary: 170 mg/dL — ABNORMAL HIGH (ref 65–99)

## 2015-01-26 MED ORDER — INSULIN ASPART 100 UNIT/ML ~~LOC~~ SOLN
0.0000 [IU] | Freq: Every day | SUBCUTANEOUS | Status: DC
  Administered 2015-01-30: 3 [IU] via SUBCUTANEOUS

## 2015-01-26 MED ORDER — FUROSEMIDE 10 MG/ML IJ SOLN
80.0000 mg | Freq: Four times a day (QID) | INTRAMUSCULAR | Status: DC
  Administered 2015-01-26 – 2015-01-27 (×3): 80 mg via INTRAVENOUS
  Filled 2015-01-26 (×6): qty 8

## 2015-01-26 MED ORDER — SODIUM CHLORIDE 0.9 % IJ SOLN: 3.0000 mL | Freq: Two times a day (BID) | INTRAMUSCULAR | Status: DC

## 2015-01-26 MED ORDER — HYDRALAZINE HCL 50 MG PO TABS
100.0000 mg | ORAL_TABLET | Freq: Three times a day (TID) | ORAL | Status: DC
  Administered 2015-01-26 – 2015-01-27 (×2): 100 mg via ORAL
  Filled 2015-01-26 (×4): qty 2

## 2015-01-26 MED ORDER — HEPARIN SODIUM (PORCINE) 5000 UNIT/ML IJ SOLN
5000.0000 [IU] | Freq: Three times a day (TID) | INTRAMUSCULAR | Status: DC
  Administered 2015-01-26 – 2015-02-01 (×16): 5000 [IU] via SUBCUTANEOUS
  Filled 2015-01-26 (×20): qty 1

## 2015-01-26 MED ORDER — CALCIUM ACETATE (PHOS BINDER) 667 MG PO CAPS
1334.0000 mg | ORAL_CAPSULE | Freq: Three times a day (TID) | ORAL | Status: DC
  Administered 2015-01-27 – 2015-02-03 (×18): 1334 mg via ORAL
  Filled 2015-01-26 (×25): qty 2

## 2015-01-26 MED ORDER — SODIUM CHLORIDE 0.9 % IV SOLN
8.0000 mg/h | INTRAVENOUS | Status: DC
  Administered 2015-01-26 – 2015-01-27 (×3): 8 mg/h via INTRAVENOUS
  Filled 2015-01-26 (×10): qty 80

## 2015-01-26 MED ORDER — SODIUM CHLORIDE 0.9 % IV SOLN
80.0000 mg | Freq: Once | INTRAVENOUS | Status: AC
  Administered 2015-01-26: 80 mg via INTRAVENOUS
  Filled 2015-01-26: qty 80

## 2015-01-26 MED ORDER — PANTOPRAZOLE SODIUM 40 MG IV SOLR: 40.0000 mg | Freq: Two times a day (BID) | INTRAVENOUS | Status: DC

## 2015-01-26 MED ORDER — ACETAMINOPHEN-CODEINE #3 300-30 MG PO TABS
1.0000 | ORAL_TABLET | Freq: Four times a day (QID) | ORAL | Status: DC | PRN
  Administered 2015-01-28: 1 via ORAL
  Administered 2015-01-29 – 2015-02-02 (×6): 2 via ORAL
  Filled 2015-01-26 (×2): qty 2
  Filled 2015-01-26: qty 1
  Filled 2015-01-26 (×5): qty 2

## 2015-01-26 MED ORDER — ATORVASTATIN CALCIUM 80 MG PO TABS
80.0000 mg | ORAL_TABLET | Freq: Every day | ORAL | Status: DC
  Administered 2015-01-27 – 2015-02-02 (×7): 80 mg via ORAL
  Filled 2015-01-26 (×8): qty 1

## 2015-01-26 MED ORDER — MONTELUKAST SODIUM 10 MG PO TABS
10.0000 mg | ORAL_TABLET | Freq: Every day | ORAL | Status: DC
  Administered 2015-01-26 – 2015-02-02 (×8): 10 mg via ORAL
  Filled 2015-01-26 (×9): qty 1

## 2015-01-26 MED ORDER — SODIUM CHLORIDE 0.9 % IV SOLN: 250.0000 mL | INTRAVENOUS | Status: DC | PRN

## 2015-01-26 MED ORDER — ASPIRIN 300 MG RE SUPP: 300.0000 mg | Freq: Once | RECTAL | Status: DC

## 2015-01-26 MED ORDER — HYDROMORPHONE HCL 1 MG/ML IJ SOLN
0.5000 mg | Freq: Once | INTRAMUSCULAR | Status: AC
  Administered 2015-01-26: 0.5 mg via INTRAVENOUS
  Filled 2015-01-26: qty 1

## 2015-01-26 MED ORDER — ASPIRIN 325 MG PO TABS
325.0000 mg | ORAL_TABLET | Freq: Every day | ORAL | Status: DC
  Administered 2015-01-26 – 2015-02-03 (×8): 325 mg via ORAL
  Filled 2015-01-26 (×9): qty 1

## 2015-01-26 MED ORDER — SODIUM CHLORIDE 0.9 % IJ SOLN
3.0000 mL | Freq: Two times a day (BID) | INTRAMUSCULAR | Status: DC
  Administered 2015-01-27: 3 mL via INTRAVENOUS

## 2015-01-26 MED ORDER — INSULIN ASPART 100 UNIT/ML ~~LOC~~ SOLN
0.0000 [IU] | Freq: Three times a day (TID) | SUBCUTANEOUS | Status: DC
Start: 2015-01-27 — End: 2015-02-03
  Administered 2015-01-27 (×2): 1 [IU] via SUBCUTANEOUS
  Administered 2015-01-30 – 2015-02-01 (×6): 2 [IU] via SUBCUTANEOUS
  Administered 2015-02-01: 3 [IU] via SUBCUTANEOUS
  Administered 2015-02-02: 2 [IU] via SUBCUTANEOUS
  Administered 2015-02-02: 3 [IU] via SUBCUTANEOUS
  Administered 2015-02-03: 2 [IU] via SUBCUTANEOUS

## 2015-01-26 MED ORDER — CALCITRIOL 0.25 MCG PO CAPS
0.2500 ug | ORAL_CAPSULE | Freq: Every day | ORAL | Status: DC
  Administered 2015-01-27 – 2015-02-02 (×6): 0.25 ug via ORAL
  Filled 2015-01-26 (×8): qty 1

## 2015-01-26 MED ORDER — FAMOTIDINE 40 MG PO TABS
40.0000 mg | ORAL_TABLET | Freq: Two times a day (BID) | ORAL | Status: DC
  Administered 2015-01-26 – 2015-01-27 (×2): 40 mg via ORAL
  Filled 2015-01-26 (×3): qty 1

## 2015-01-26 MED ORDER — SODIUM CHLORIDE 0.9 % IJ SOLN: 3.0000 mL | INTRAMUSCULAR | Status: DC | PRN

## 2015-01-26 MED ORDER — CARVEDILOL 25 MG PO TABS
25.0000 mg | ORAL_TABLET | Freq: Two times a day (BID) | ORAL | Status: DC
  Administered 2015-01-26 – 2015-02-03 (×12): 25 mg via ORAL
  Filled 2015-01-26 (×18): qty 1

## 2015-01-26 NOTE — ED Notes (Signed)
Admitting MD at BS.  

## 2015-01-26 NOTE — ED Notes (Signed)
Per [facility], pt from [facility] for N/V and SOB since last night. States he had hematemesis last night. Is not a dialysis pt yet but goes there as type of PCP. On RA was 90 %, placed on 4 liters and came up to 97 %. Has rhonchi and rales. Also had ETOH last night.

## 2015-01-26 NOTE — ED Provider Notes (Signed)
CSN: [id number]     Arrival date & time 01/26/15  1546 History   First MD Initiated Contact with Patient 01/26/15 1551     Chief Complaint  Patient presents with  . Shortness of Breath  . Hematemesis     (Consider location/radiation/quality/duration/timing/severity/associated sxs/prior Treatment) Patient is a 54 y.o. male presenting with shortness of breath. The history is provided by the patient.  Shortness of Breath Severity:  Mild Onset quality:  Gradual Duration:  3 days Timing:  Constant Progression:  Worsening Chronicity:  Recurrent Context: activity   Relieved by:  Rest Worsened by:  Nothing tried Ineffective treatments:  None tried Associated symptoms: chest pain and vomiting   Associated symptoms: no cough, no fever, no headaches and no neck pain   Chest pain:    Quality:  Sharp   Severity:  Mild   Onset quality:  Gradual   Duration:  1 week   Timing:  Intermittent   Progression:  Unchanged   Chronicity:  New Vomiting:    Quality:  Stomach contents and bright red blood   Severity:  Moderate   Timing:  Constant   Progression:  Resolved Risk factors: alcohol use     Past Medical History  Diagnosis Date  . PVD (peripheral vascular disease)   . Shortness of breath dyspnea   . Constipation   . Anemia     unsure  . CHF (congestive heart failure) 2015  . Diastolic congestive heart failure   . Diabetes mellitus 2015    type 2  . GERD (gastroesophageal reflux disease)     unsure  . Hypertension 2015  . Chronic renal disease, stage IV 2015  . Cocaine abuse     last used 3 month go   . Cellulitis and abscess of foot 08/24/2011   Past Surgical History  Procedure Laterality Date  . Leg surgery    . Knee surgery Right   . Amputation  09/06/2011    Procedure: AMPUTATION RAY;  Surgeon: Wylene Simmer, MD;  Location: [facility];  Service: Orthopedics;  Laterality: Left;  Left [place] Amputation  . Av fistula placement Left 08/12/2014    Procedure: CREATION OF A  BRACHIOCEPHALIC ARTERIOVENOUS (AV) FISTULA  LEFT ARM;  Surgeon: Mal Misty, MD;  Location: [facility];  Service: Vascular;  Laterality: Left;  . Av fistula placement Right 10/07/2014    Procedure: Creation Right Arm Arteriovenous fistula;  Surgeon: Mal Misty, MD;  Location: [facility] OR;  Service: Vascular;  Laterality: Right;  . Appendectomy     Family History  Problem Relation Age of Onset  . Adopted: Yes  . Varicose Veins Mother   . Varicose Veins Sister    History  Substance Use Topics  . Smoking status: Never Smoker   . Smokeless tobacco: Never Used  . Alcohol Use: No    Review of Systems  Constitutional: Negative for fever.  HENT: Negative for drooling and rhinorrhea.   Eyes: Positive for visual disturbance. Negative for pain.  Respiratory: Positive for shortness of breath. Negative for cough.   Cardiovascular: Positive for chest pain and leg swelling.  Gastrointestinal: Positive for nausea, vomiting and diarrhea.  Genitourinary: Negative for dysuria and hematuria.  Musculoskeletal: Negative for gait problem and neck pain.  Skin: Negative for color change.  Neurological: Negative for numbness and headaches.  Hematological: Negative for adenopathy.  Psychiatric/Behavioral: Negative for behavioral problems.  All other systems reviewed and are negative.     Allergies  Review of patient's allergies indicates  no known allergies.  Home Medications   Prior to Admission medications   Medication Sig Start Date End Date Taking? Authorizing Provider  acetaminophen-codeine (TYLENOL #3) 300-30 MG per tablet Take 1-2 tablets by mouth every 6 (six) hours as needed for moderate pain. 01/04/15  Yes Micheline Chapman, NP  calcitRIOL (ROCALTROL) 0.25 MCG capsule Take 1 capsule (0.25 mcg total) by mouth daily. 11/05/14  Yes [name], MD  calcium acetate (PHOSLO) 667 MG capsule Take 2 capsules (1,334 mg total) by mouth 3 (three) times daily with meals. 12/15/14  Yes Renella Cunas, MD   carvedilol (COREG) 25 MG tablet Take 1 tablet (25 mg total) by mouth 2 (two) times daily with a meal. 09/15/14  Yes Renella Cunas, MD  famotidine (PEPCID) 40 MG tablet Take 1 tablet (40 mg total) by mouth 2 (two) times daily. 12/01/14  Yes Thurnell Lose, MD  furosemide (LASIX) 80 MG tablet Take 2 tablets (160 mg total) by mouth 3 (three) times daily. Per renal 12/28/14  Yes [name], MD  hydrALAZINE (APRESOLINE) 100 MG tablet Take 1 tablet (100 mg total) by mouth 3 (three) times daily. 12/07/14  Yes Josalyn Funches, MD  montelukast (SINGULAIR) 10 MG tablet Take 1 tablet (10 mg total) by mouth at bedtime. 12/01/14  Yes Thurnell Lose, MD  chlorpheniramine-HYDROcodone (TUSSIONEX) 10-8 MG/5ML LQCR Take 5 mLs by mouth every 12 (twelve) hours as needed for cough. Patient not taking: Reported on 12/15/2014 12/01/14   Thurnell Lose, MD  insulin aspart (NOVOLOG) 100 UNIT/ML injection Before each meal 3 times a day, 140-199 - 2 units, 200-250 - 4 units, 251-299 - 6 units,  300-349 - 8 units,  350 or above 10 units. Insulin PEN if approved, provide syringes and needles if needed. Can switch to any short-acting insulin. Patient not taking: Reported on 01/04/2015 12/01/14   Thurnell Lose, MD  Insulin Syringe-Needle U-100 25G X 1" 1 ML MISC Any brand, for 4 times a day insulin SQ, 1 month supply. Patient not taking: Reported on 01/04/2015 12/01/14   Thurnell Lose, MD   Pulse 84  Temp(Src) 98.6 F (37 C) (Oral)  Resp 16  Ht 5\' 11"  (1.803 m)  Wt 190 lb (86.183 kg)  BMI 26.51 kg/m2  SpO2 95% Physical Exam  Constitutional: He is oriented to person, place, and time. He appears well-developed and well-nourished.  HENT:  Head: Normocephalic and atraumatic.  Right Ear: External ear normal.  Left Ear: External ear normal.  Nose: Nose normal.  Mouth/Throat: Oropharynx is clear and moist. No oropharyngeal exudate.  Eyes: Conjunctivae and EOM are normal. Pupils are equal, round, and reactive to light.   Neck: Normal range of motion. Neck supple.  Cardiovascular: Normal rate, regular rhythm, normal heart sounds and intact distal pulses.  Exam reveals no gallop and no friction rub.   No murmur heard. Pulmonary/Chest: Effort normal and breath sounds normal. No respiratory distress. He has no wheezes.  Abdominal: Soft. Bowel sounds are normal. He exhibits no distension. There is no tenderness. There is no rebound and no guarding.  Genitourinary:  Normal-appearing external rectum. Normal palpation during rectal exam. Brown stool.  Musculoskeletal: Normal range of motion. He exhibits edema (mild to moderate edema and distal bilateral lower extremities.). He exhibits no tenderness.  Neurological: He is alert and oriented to person, place, and time.  Skin: Skin is warm and dry.  Psychiatric: He has a normal mood and affect. His behavior is normal.  Nursing  note and vitals reviewed.   ED Course  Procedures (including critical care time) Labs Review Labs Reviewed  CBC - Abnormal; Notable for the following:    WBC 11.8 (*)    RBC 2.69 (*)    Hemoglobin 8.3 (*)    HCT 24.7 (*)    All other components within normal limits  BRAIN NATRIURETIC PEPTIDE - Abnormal; Notable for the following:    B Natriuretic Peptide 2258.1 (*)    All other components within normal limits  URINE RAPID DRUG SCREEN (HOSP PERFORMED) - Abnormal; Notable for the following:    Opiates POSITIVE (*)    Cocaine POSITIVE (*)    All other components within normal limits  COMPREHENSIVE METABOLIC PANEL - Abnormal; Notable for the following:    Sodium 134 (*)    CO2 16 (*)    Glucose, Bld 156 (*)    BUN 63 (*)    Creatinine, Ser 5.96 (*)    Calcium 8.8 (*)    Albumin 2.5 (*)    Alkaline Phosphatase 225 (*)    Total Bilirubin 1.6 (*)    GFR calc non Af Amer 10 (*)    GFR calc Af Amer 11 (*)    All other components within normal limits  I-STAT TROPOININ, ED - Abnormal; Notable for the following:    Troponin i, poc 0.10  (*)    All other components within normal limits  ETHANOL  OCCULT BLOOD X 1 CARD TO LAB, STOOL  I-STAT TROPOININ, ED  POC OCCULT BLOOD, ED    Imaging Review Dg Chest 2 View  01/26/2015   CLINICAL DATA:  One day history of shortness of breath  EXAM: CHEST  2 VIEW  COMPARISON:  November 29, 2014  FINDINGS: There is no appreciable edema or consolidation. Heart size and pulmonary vascularity are within normal limits. No adenopathy. There is mild degenerative change in the thoracic spine.  IMPRESSION: No edema or consolidation.   Electronically Signed   By: Lowella Grip III M.D.   On: 01/26/2015 16:54     EKG Interpretation   Date/Time:  Wednesday Jan 26 2015 15:57:49 EDT Ventricular Rate:  85 PR Interval:  128 QRS Duration: 96 QT Interval:  384 QTC Calculation: 457 R Axis:   -10 Text Interpretation:  Sinus rhythm RSR' in V1 or V2, probably normal  variant Left ventricular hypertrophy Baseline wander in lead(s) V3 No  significant change since last tracing Confirmed by Doralee Kocak  MD, Haylen Shelnutt  ([id number]) on 01/26/2015 4:22:20 PM      MDM   Final diagnoses:  CHF exacerbation  Elevated troponin  Cocaine use  Hematemesis with nausea    4:11 PM 54 y.o. male w hx of chf, dm, htn, CKD who presents with multiple complaints from his kidney doctors office. He states that he has had intermittent central sharp chest pain lasting up to 20 minutes at a time with and without exertion for the last week. He also notes some worsening edema in his lower extremities and shortness of breath with exertion over the last 2-3 days. He states that he began drinking brandy and beer last night due to life stressors and woke up at 3 AM with multiple episodes of emesis. After vomiting multiple times he began having several episodes of hematemesis with bright blood. He denies seeing any blood in his stool. He currently complains of 1 out of 10 chest pain but denies any abdominal pain. Vital signs unremarkable here.  We'll get screening labs and  imaging.  Mildly elevated troponin. Likely mild chf exac. Will admit to hospitalist. Pain controlled in ED.    Pamella Pert, MD 01/26/15 [phone]

## 2015-01-26 NOTE — ED Notes (Signed)
Flow called to inquire about bed status.  

## 2015-01-26 NOTE — Progress Notes (Signed)
During initial assessment pt. Stated that is if had a gun the night before he would have killed himself. MD made aware, suicide sitter orders. NT, Whitney at the bedside to sit with pt.

## 2015-01-26 NOTE — ED Notes (Signed)
Attempted to give report 

## 2015-01-26 NOTE — ED Notes (Signed)
Dr. Harrison at the bedside. 

## 2015-01-26 NOTE — ED Notes (Signed)
NOTIFIED DR. HARRISON FOR PATIENTS LAB RESULTS OF I-STAT TROPONIN@16 :47 PM ,05/05/.

## 2015-01-26 NOTE — ED Notes (Signed)
Rolled to the bathroom to have a bowel movement.

## 2015-01-26 NOTE — H&P (Signed)
[name] is an 54 y.o. male.    Pcp:  [facility] ???  Chief Complaint: dyspnea HPI: 54 yo male with hx of CHF (ef=55%), hypertension, dm2, cocaine abuse, apparently c/o sob and swelling of the legs for the past couple of days.  Pt notes that he had sscp, " sharp" radiating to the neck.  Pt states that pain constant and associate with n/v.  Pt notes that he had some blood w/ emesis after multiple episodes of n/v.  Pt denies nsaid use.  Pt states that pain has improved.  CXR  =>negative ekg => nsr,  No st -t changes c/w acute ischemia.  pt will be admitted for chest pain r/o.   Past Medical History  Diagnosis Date  . PVD (peripheral vascular disease)   . Shortness of breath dyspnea   . Constipation   . Anemia     unsure  . CHF (congestive heart failure) 2015  . Diastolic congestive heart failure   . Diabetes mellitus 2015    type 2  . GERD (gastroesophageal reflux disease)     unsure  . Hypertension 2015  . Chronic renal disease, stage IV 2015  . Cocaine abuse   . Cellulitis and abscess of foot 08/24/2011  . Protein calorie malnutrition     Past Surgical History  Procedure Laterality Date  . Leg surgery    . Knee surgery Right   . Amputation  09/06/2011    Procedure: AMPUTATION RAY;  Surgeon: Wylene Simmer, MD;  Location: [facility];  Service: Orthopedics;  Laterality: Left;  Left Hallux Amputation  . Av fistula placement Left 08/12/2014    Procedure: CREATION OF A BRACHIOCEPHALIC ARTERIOVENOUS (AV) FISTULA  LEFT ARM;  Surgeon: Mal Misty, MD;  Location: [place];  Service: Vascular;  Laterality: Left;  . Av fistula placement Right 10/07/2014    Procedure: Creation Right Arm Arteriovenous fistula;  Surgeon: Mal Misty, MD;  Location: [facility] OR;  Service: Vascular;  Laterality: Right;  . Appendectomy      Family History  Problem Relation Age of Onset  . Adopted: Yes  . Varicose Veins Mother   . Varicose Veins Sister    Social History:  reports that he has  never smoked. He has never used smokeless tobacco. He reports that he does not drink alcohol or use illicit drugs.  Allergies: No Known Allergies Medications reviewed   Results for orders placed or performed during the hospital encounter of 01/26/15 (from the past 48 hour(s))  CBC     Status: Abnormal   Collection Time: 01/26/15  4:05 PM  Result Value Ref Range   WBC 11.8 (H) 4.0 - 10.5 K/uL   RBC 2.69 (L) 4.22 - 5.81 MIL/uL   Hemoglobin 8.3 (L) 13.0 - 17.0 g/dL   HCT 24.7 (L) 39.0 - 52.0 %   MCV 91.8 78.0 - 100.0 fL   MCH 30.9 26.0 - 34.0 pg   MCHC 33.6 30.0 - 36.0 g/dL   RDW 15.5 11.5 - 15.5 %   Platelets 397 150 - 400 K/uL  BNP (order ONLY if patient complains of dyspnea/SOB AND you have documented it for THIS visit)     Status: Abnormal   Collection Time: 01/26/15  4:15 PM  Result Value Ref Range   B Natriuretic Peptide 2258.1 (H) 0.0 - 100.0 pg/mL  Comprehensive metabolic panel     Status: Abnormal   Collection Time: 01/26/15  4:15 PM  Result Value Ref Range   Sodium  134 (L) 135 - 145 mmol/L   Potassium 4.4 3.5 - 5.1 mmol/L   Chloride 107 101 - 111 mmol/L   CO2 16 (L) 22 - 32 mmol/L   Glucose, Bld 156 (H) 65 - 99 mg/dL   BUN 63 (H) 6 - 20 mg/dL   Creatinine, Ser 5.96 (H) 0.61 - 1.24 mg/dL   Calcium 8.8 (L) 8.9 - 10.3 mg/dL   Total Protein 6.9 6.5 - 8.1 g/dL   Albumin 2.5 (L) 3.5 - 5.0 g/dL   AST 40 15 - 41 U/L   ALT 51 17 - 63 U/L   Alkaline Phosphatase 225 (H) 38 - 126 U/L   Total Bilirubin 1.6 (H) 0.3 - 1.2 mg/dL   GFR calc non Af Amer 10 (L) >60 mL/min   GFR calc Af Amer 11 (L) >60 mL/min    Comment: (NOTE) The eGFR has been calculated using the CKD EPI equation. This calculation has not been validated in all clinical situations. eGFR's persistently <60 mL/min signify possible Chronic Kidney Disease.    Anion gap 11 5 - 15  Ethanol     Status: None   Collection Time: 01/26/15  4:15 PM  Result Value Ref Range   Alcohol, Ethyl (B) <5 <5 mg/dL    Comment:         LOWEST DETECTABLE LIMIT FOR SERUM ALCOHOL IS 11 mg/dL FOR MEDICAL PURPOSES ONLY   I-stat troponin, ED  (not at [facility], [facility])     Status: Abnormal   Collection Time: 01/26/15  4:31 PM  Result Value Ref Range   Troponin i, poc 0.10 (HH) 0.00 - 0.08 ng/mL   Comment NOTIFIED PHYSICIAN    Comment 3            Comment: Due to the release kinetics of cTnI, a negative result within the first hours of the onset of symptoms does not rule out myocardial infarction with certainty. If myocardial infarction is still suspected, repeat the test at appropriate intervals.   Drug screen panel, emergency     Status: Abnormal   Collection Time: 01/26/15  4:47 PM  Result Value Ref Range   Opiates POSITIVE (A) NONE DETECTED   Cocaine POSITIVE (A) NONE DETECTED   Benzodiazepines NONE DETECTED NONE DETECTED   Amphetamines NONE DETECTED NONE DETECTED   Tetrahydrocannabinol NONE DETECTED NONE DETECTED   Barbiturates NONE DETECTED NONE DETECTED    Comment:        DRUG SCREEN FOR MEDICAL PURPOSES ONLY.  IF CONFIRMATION IS NEEDED FOR ANY PURPOSE, NOTIFY LAB WITHIN 5 DAYS.        LOWEST DETECTABLE LIMITS FOR URINE DRUG SCREEN Drug Class       Cutoff (ng/mL) Amphetamine      1000 Barbiturate      200 Benzodiazepine   301 Tricyclics       601 Opiates          300 Cocaine          300 THC              50   POC occult blood, ED     Status: None   Collection Time: 01/26/15  5:24 PM  Result Value Ref Range   Fecal Occult Bld NEGATIVE NEGATIVE   Dg Chest 2 View  01/26/2015   CLINICAL DATA:  One day history of shortness of breath  EXAM: CHEST  2 VIEW  COMPARISON:  November 29, 2014  FINDINGS: There is no appreciable edema or consolidation. Heart  size and pulmonary vascularity are within normal limits. No adenopathy. There is mild degenerative change in the thoracic spine.  IMPRESSION: No edema or consolidation.   Electronically Signed   By: Lowella Grip III M.D.   On: 01/26/2015 16:54    Review of  Systems  Constitutional: Positive for weight loss. Negative for fever, chills, malaise/fatigue and diaphoresis.  HENT: Negative.   Eyes: Negative.   Respiratory: Positive for shortness of breath. Negative for cough, hemoptysis, sputum production and wheezing.   Cardiovascular: Positive for chest pain and leg swelling. Negative for palpitations, orthopnea, claudication and PND.  Gastrointestinal: Negative for heartburn, nausea, vomiting, abdominal pain, diarrhea, constipation, blood in stool and melena.  Genitourinary: Negative for dysuria, urgency, frequency, hematuria and flank pain.  Musculoskeletal: Negative for myalgias, back pain, joint pain, falls and neck pain.  Skin: Negative.   Neurological: Negative.  Negative for weakness.  Endo/Heme/Allergies: Negative for environmental allergies and polydipsia. Does not bruise/bleed easily.  Psychiatric/Behavioral: Negative.     Blood pressure 140/77, pulse 79, temperature 98.6 F (37 C), temperature source Oral, resp. rate 15, height 5' 11"  (1.803 m), weight 86.183 kg (190 lb), SpO2 99 %. Physical Exam  Constitutional: He is oriented to person, place, and time. He appears well-developed and well-nourished.  HENT:  Head: Normocephalic and atraumatic.  Mouth/Throat: No oropharyngeal exudate.  Eyes: Conjunctivae and EOM are normal. Pupils are equal, round, and reactive to light. No scleral icterus.  Neck: Normal range of motion. Neck supple. JVD present. No tracheal deviation present. No thyromegaly present.  Cardiovascular: Normal rate and regular rhythm.  Exam reveals no gallop and no friction rub.   No murmur heard. Respiratory: Effort normal and breath sounds normal. No respiratory distress. He has no wheezes. He has no rales.  GI: Soft. Bowel sounds are normal. He exhibits no distension. There is no tenderness. There is no rebound and no guarding.  Musculoskeletal: Normal range of motion. He exhibits edema. He exhibits no tenderness.   Lymphadenopathy:    He has no cervical adenopathy.  Neurological: He is alert and oriented to person, place, and time. He has normal reflexes. He displays normal reflexes. No cranial nerve deficit. He exhibits normal muscle tone. Coordination normal.  Skin: Skin is warm and dry. No rash noted. No erythema. No pallor.  Psychiatric: He has a normal mood and affect. His behavior is normal. Judgment and thought content normal.     Assessment/Plan Chest pain Tele Trop i q6hx3 Check echo NPO after mn, depending upon his cardiac markers consider stress testing vs cath Ecasa 386m pr x1.  Lipitor 823mpo qhs,  Continue carvedilol   ? GI bleeding protonix 8054mv x1 and then 8mg22mr NPO  Consider GI consultation, heme occult stool pending  Anemia Check iron studies, b12, folate, spep, upep  ESRD Please consult nephrology  In am for need for dialysis  CHF ? Fluid overload Pt probably needs dialysis.   DVT prophylaxis:  SCD   Sharyn Brilliant,Jani Gravel5/2016, 7:36 PM

## 2015-01-27 ENCOUNTER — Inpatient Hospital Stay (HOSPITAL_COMMUNITY): Payer: Medicaid Other

## 2015-01-27 ENCOUNTER — Other Ambulatory Visit (HOSPITAL_COMMUNITY): Payer: Self-pay

## 2015-01-27 DIAGNOSIS — R45851 Suicidal ideations: Secondary | ICD-10-CM

## 2015-01-27 DIAGNOSIS — N186 End stage renal disease: Secondary | ICD-10-CM

## 2015-01-27 DIAGNOSIS — I5032 Chronic diastolic (congestive) heart failure: Secondary | ICD-10-CM | POA: Diagnosis present

## 2015-01-27 DIAGNOSIS — I509 Heart failure, unspecified: Secondary | ICD-10-CM

## 2015-01-27 DIAGNOSIS — R7989 Other specified abnormal findings of blood chemistry: Secondary | ICD-10-CM

## 2015-01-27 DIAGNOSIS — I5031 Acute diastolic (congestive) heart failure: Secondary | ICD-10-CM

## 2015-01-27 DIAGNOSIS — F1910 Other psychoactive substance abuse, uncomplicated: Secondary | ICD-10-CM

## 2015-01-27 DIAGNOSIS — Z992 Dependence on renal dialysis: Secondary | ICD-10-CM

## 2015-01-27 LAB — IRON AND TIBC
Iron: 38 ug/dL — ABNORMAL LOW (ref 45–182)
SATURATION RATIOS: 21 % (ref 17.9–39.5)
TIBC: 185 ug/dL — AB (ref 250–450)
UIBC: 147 ug/dL

## 2015-01-27 LAB — COMPREHENSIVE METABOLIC PANEL
ALT: 40 U/L (ref 17–63)
ANION GAP: 9 (ref 5–15)
AST: 25 U/L (ref 15–41)
Albumin: 2.2 g/dL — ABNORMAL LOW (ref 3.5–5.0)
Alkaline Phosphatase: 188 U/L — ABNORMAL HIGH (ref 38–126)
BILIRUBIN TOTAL: 1.1 mg/dL (ref 0.3–1.2)
BUN: 67 mg/dL — ABNORMAL HIGH (ref 6–20)
CHLORIDE: 108 mmol/L (ref 101–111)
CO2: 19 mmol/L — ABNORMAL LOW (ref 22–32)
Calcium: 8.5 mg/dL — ABNORMAL LOW (ref 8.9–10.3)
Creatinine, Ser: 6.31 mg/dL — ABNORMAL HIGH (ref 0.61–1.24)
GFR calc non Af Amer: 9 mL/min — ABNORMAL LOW (ref >60)
GFR, EST AFRICAN AMERICAN: 11 mL/min — AB (ref >60)
Glucose, Bld: 119 mg/dL — ABNORMAL HIGH (ref 65–99)
POTASSIUM: 4.1 mmol/L (ref 3.5–5.1)
Sodium: 136 mmol/L (ref 135–145)
TOTAL PROTEIN: 6.3 g/dL — AB (ref 6.5–8.1)

## 2015-01-27 LAB — CBC
HCT: 21.6 % — ABNORMAL LOW (ref 39.0–52.0)
HEMOGLOBIN: 7.3 g/dL — AB (ref 13.0–17.0)
MCH: 30.4 pg (ref 26.0–34.0)
MCHC: 33.8 g/dL (ref 30.0–36.0)
MCV: 90 fL (ref 78.0–100.0)
Platelets: 352 10*3/uL (ref 150–400)
RBC: 2.4 MIL/uL — ABNORMAL LOW (ref 4.22–5.81)
RDW: 15.1 % (ref 11.5–15.5)
WBC: 10.4 10*3/uL (ref 4.0–10.5)

## 2015-01-27 LAB — FERRITIN: Ferritin: 316 ng/mL (ref 24–336)

## 2015-01-27 LAB — CK TOTAL AND CKMB (NOT AT ARMC)
CK, MB: 5.2 ng/mL — ABNORMAL HIGH (ref 0.5–5.0)
Relative Index: 3.8 — ABNORMAL HIGH (ref 0.0–2.5)
Total CK: 137 U/L (ref 49–397)

## 2015-01-27 LAB — TROPONIN I
TROPONIN I: 0.07 ng/mL — AB (ref <0.031)
Troponin I: 0.06 ng/mL — ABNORMAL HIGH (ref <0.031)
Troponin I: 0.05 ng/mL — ABNORMAL HIGH (ref <0.031)

## 2015-01-27 LAB — SEDIMENTATION RATE: Sed Rate: 103 mm/hr — ABNORMAL HIGH (ref 0–16)

## 2015-01-27 LAB — MRSA PCR SCREENING: MRSA by PCR: NEGATIVE

## 2015-01-27 LAB — GLUCOSE, CAPILLARY
GLUCOSE-CAPILLARY: 117 mg/dL — AB (ref 65–99)
Glucose-Capillary: 126 mg/dL — ABNORMAL HIGH (ref 65–99)
Glucose-Capillary: 146 mg/dL — ABNORMAL HIGH (ref 65–99)
Glucose-Capillary: 143 mg/dL — ABNORMAL HIGH (ref 65–99)

## 2015-01-27 LAB — VITAMIN B12: VITAMIN B 12: 559 pg/mL (ref 180–914)

## 2015-01-27 LAB — TSH: TSH: 0.748 u[IU]/mL (ref 0.350–4.500)

## 2015-01-27 MED ORDER — SODIUM CHLORIDE 0.9 % IV SOLN
125.0000 mg | Freq: Every day | INTRAVENOUS | Status: AC
  Administered 2015-01-27 – 2015-01-31 (×4): 125 mg via INTRAVENOUS
  Filled 2015-01-27 (×10): qty 10

## 2015-01-27 MED ORDER — DARBEPOETIN ALFA 150 MCG/0.3ML IJ SOSY
150.0000 ug | PREFILLED_SYRINGE | INTRAMUSCULAR | Status: DC
  Administered 2015-01-28: 150 ug via INTRAVENOUS
  Filled 2015-01-27: qty 0.3

## 2015-01-27 NOTE — Consult Note (Signed)
[facility] Renal Consultation Note  Requesting MD: Sherral Hammers Indication for Consultation: stage 5 CKD- now needing to initiate HD  HPI:  [name] is a 54 y.o. male with past medical history significant for hypertension, diabetes mellitus, peripheral arterial disease as well as quickly progressive chronic kidney disease due to above. Courses also been complicated by noncompliance and substance abuse. Patient had been followed by Dr. Precious Bard at [facility]. He has an AV fistula in place which is mature. In April 2016 creatinine was noted to be above 6 he was also anemic. Patient had been hesitant to initiate dialysis therapy. She presented to the office yesterday extremely short of breath and fatigued. Therefore was sent to the hospital with volume overload. His creatinine again is in the 60s with an elevated BUN, decreased albumen he continues to be anemic. He now agrees to initiate dialysis therapy and I think it is the correct way to go at this time. Also of note, back in December he had an SPEP which was positive. An appointment was made to see hematology as an outpatient but he was never able to get there. Patient is seen today. He is complaining of not being able to T due to his cardiac evaluation. There is a sitter in the room because apparently [name].   CREATININE, SER  Date/Time Value Ref Range Status  01/27/2015 03:00 AM 6.31* 0.61 - 1.24 mg/dL Final  01/26/2015 04:15 PM 5.96* 0.61 - 1.24 mg/dL Final  12/01/2014 04:52 AM 7.00* 0.50 - 1.35 mg/dL Final  11/30/2014 02:45 AM 6.92* 0.50 - 1.35 mg/dL Final  11/29/2014 10:25 AM 6.78* 0.50 - 1.35 mg/dL Final  09/14/2014 12:28 PM 6.01* 0.50 - 1.35 mg/dL Final  08/08/2014 04:34 AM 6.80* 0.50 - 1.35 mg/dL Final  08/07/2014 06:35 AM 6.59* 0.50 - 1.35 mg/dL Final  08/06/2014 03:45 AM 6.28* 0.50 - 1.35 mg/dL Final  08/05/2014 04:23 AM 5.97* 0.50 - 1.35 mg/dL  Final  08/04/2014 10:07 AM 5.34* 0.50 - 1.35 mg/dL Final  01/12/2014 02:10 PM 3.57* 0.50 - 1.35 mg/dL Final  09/10/2013 03:50 AM 3.00* 0.50 - 1.35 mg/dL Final  09/09/2013 03:25 AM 3.26* 0.50 - 1.35 mg/dL Final  09/08/2013 03:44 AM 3.16* 0.50 - 1.35 mg/dL Final  09/07/2013 04:20 AM 3.35* 0.50 - 1.35 mg/dL Final  09/06/2013 04:05 AM 3.17* 0.50 - 1.35 mg/dL Final  07/23/2012 12:16 PM 1.60* 0.50 - 1.35 mg/dL Final  02/18/2012 01:59 PM 1.60* 0.50 - 1.35 mg/dL Final  09/09/2011 06:00 AM 1.44* 0.50 - 1.35 mg/dL Final  09/08/2011 06:00 AM 1.31 0.50 - 1.35 mg/dL Final  09/07/2011 06:24 AM 1.44* 0.50 - 1.35 mg/dL Final  09/06/2011 05:05 AM 1.24 0.50 - 1.35 mg/dL Final  09/05/2011 11:57 PM 1.34 0.50 - 1.35 mg/dL Final  08/24/2011 05:35 AM 1.25 0.50 - 1.35 mg/dL Final  08/24/2011 12:41 AM 1.40* 0.50 - 1.35 mg/dL Final     PMHx:   Past Medical History  Diagnosis Date  . PVD (peripheral vascular disease)   . Shortness of breath dyspnea   . Constipation   . Anemia     unsure  . CHF (congestive heart failure) 2015  . Diastolic congestive heart failure   . Diabetes mellitus 2015    type 2  . GERD (gastroesophageal reflux disease)     unsure  . Hypertension 2015  . Chronic renal disease, stage IV 2015  . Cocaine abuse   . Cellulitis and abscess of  foot 08/24/2011  . Protein calorie malnutrition     Past Surgical History  Procedure Laterality Date  . Leg surgery    . Knee surgery Right   . Amputation  09/06/2011    Procedure: AMPUTATION RAY;  Surgeon: Wylene Simmer, MD;  Location: [place];  Service: Orthopedics;  Laterality: Left;  Left Hallux Amputation  . Av fistula placement Left 08/12/2014    Procedure: CREATION OF A BRACHIOCEPHALIC ARTERIOVENOUS (AV) FISTULA  LEFT ARM;  Surgeon: Mal Misty, MD;  Location: [place];  Service: Vascular;  Laterality: Left;  . Av fistula placement Right 10/07/2014    Procedure: Creation Right Arm Arteriovenous fistula;  Surgeon: Mal Misty, MD;   Location: [facility] OR;  Service: Vascular;  Laterality: Right;  . Appendectomy      Family Hx:  Family History  Problem Relation Age of Onset  . Adopted: Yes  . Varicose Veins Mother   . Varicose Veins Sister     Social History:  reports that he has never smoked. He has never used smokeless tobacco. He reports that he does not drink alcohol or use illicit drugs.  Allergies: No Known Allergies  Medications: Prior to Admission medications   Medication Sig Start Date End Date Taking? Authorizing Provider  acetaminophen-codeine (TYLENOL #3) 300-30 MG per tablet Take 1-2 tablets by mouth every 6 (six) hours as needed for moderate pain. 01/04/15  Yes Micheline Chapman, NP  calcitRIOL (ROCALTROL) 0.25 MCG capsule Take 1 capsule (0.25 mcg total) by mouth daily. 11/05/14  Yes [name], MD  calcium acetate (PHOSLO) 667 MG capsule Take 2 capsules (1,334 mg total) by mouth 3 (three) times daily with meals. 12/15/14  Yes Renella Cunas, MD  carvedilol (COREG) 25 MG tablet Take 1 tablet (25 mg total) by mouth 2 (two) times daily with a meal. 09/15/14  Yes Renella Cunas, MD  famotidine (PEPCID) 40 MG tablet Take 1 tablet (40 mg total) by mouth 2 (two) times daily. 12/01/14  Yes Thurnell Lose, MD  furosemide (LASIX) 80 MG tablet Take 2 tablets (160 mg total) by mouth 3 (three) times daily. Per renal 12/28/14  Yes [name], MD  hydrALAZINE (APRESOLINE) 100 MG tablet Take 1 tablet (100 mg total) by mouth 3 (three) times daily. 12/07/14  Yes Josalyn Funches, MD  montelukast (SINGULAIR) 10 MG tablet Take 1 tablet (10 mg total) by mouth at bedtime. 12/01/14  Yes Thurnell Lose, MD  chlorpheniramine-HYDROcodone (TUSSIONEX) 10-8 MG/5ML LQCR Take 5 mLs by mouth every 12 (twelve) hours as needed for cough. Patient not taking: Reported on 12/15/2014 12/01/14   Thurnell Lose, MD  insulin aspart (NOVOLOG) 100 UNIT/ML injection Before each meal 3 times a day, 140-199 - 2 units, 200-250 - 4 units, 251-299 - 6  units,  300-349 - 8 units,  350 or above 10 units. Insulin PEN if approved, provide syringes and needles if needed. Can switch to any short-acting insulin. Patient not taking: Reported on 01/04/2015 12/01/14   Thurnell Lose, MD  Insulin Syringe-Needle U-100 25G X 1" 1 ML MISC Any brand, for 4 times a day insulin SQ, 1 month supply. Patient not taking: Reported on 01/04/2015 12/01/14   Thurnell Lose, MD    I have reviewed the patient's current medications.  Labs:  Results for orders placed or performed during the hospital encounter of 01/26/15 (from the past 48 hour(s))  CBC     Status: Abnormal   Collection Time: 01/26/15  4:05  PM  Result Value Ref Range   WBC 11.8 (H) 4.0 - 10.5 K/uL   RBC 2.69 (L) 4.22 - 5.81 MIL/uL   Hemoglobin 8.3 (L) 13.0 - 17.0 g/dL   HCT 24.7 (L) 39.0 - 52.0 %   MCV 91.8 78.0 - 100.0 fL   MCH 30.9 26.0 - 34.0 pg   MCHC 33.6 30.0 - 36.0 g/dL   RDW 15.5 11.5 - 15.5 %   Platelets 397 150 - 400 K/uL  BNP (order ONLY if patient complains of dyspnea/SOB AND you have documented it for THIS visit)     Status: Abnormal   Collection Time: 01/26/15  4:15 PM  Result Value Ref Range   B Natriuretic Peptide 2258.1 (H) 0.0 - 100.0 pg/mL  Comprehensive metabolic panel     Status: Abnormal   Collection Time: 01/26/15  4:15 PM  Result Value Ref Range   Sodium 134 (L) 135 - 145 mmol/L   Potassium 4.4 3.5 - 5.1 mmol/L   Chloride 107 101 - 111 mmol/L   CO2 16 (L) 22 - 32 mmol/L   Glucose, Bld 156 (H) 65 - 99 mg/dL   BUN 63 (H) 6 - 20 mg/dL   Creatinine, Ser 5.96 (H) 0.61 - 1.24 mg/dL   Calcium 8.8 (L) 8.9 - 10.3 mg/dL   Total Protein 6.9 6.5 - 8.1 g/dL   Albumin 2.5 (L) 3.5 - 5.0 g/dL   AST 40 15 - 41 U/L   ALT 51 17 - 63 U/L   Alkaline Phosphatase 225 (H) 38 - 126 U/L   Total Bilirubin 1.6 (H) 0.3 - 1.2 mg/dL   GFR calc non Af Amer 10 (L) >60 mL/min   GFR calc Af Amer 11 (L) >60 mL/min    Comment: (NOTE) The eGFR has been calculated using the CKD EPI equation. This  calculation has not been validated in all clinical situations. eGFR's persistently <60 mL/min signify possible Chronic Kidney Disease.    Anion gap 11 5 - 15  Ethanol     Status: None   Collection Time: 01/26/15  4:15 PM  Result Value Ref Range   Alcohol, Ethyl (B) <5 <5 mg/dL    Comment:        LOWEST DETECTABLE LIMIT FOR SERUM ALCOHOL IS 11 mg/dL FOR MEDICAL PURPOSES ONLY   I-stat troponin, ED  (not at [facility], [facility])     Status: Abnormal   Collection Time: 01/26/15  4:31 PM  Result Value Ref Range   Troponin i, poc 0.10 (HH) 0.00 - 0.08 ng/mL   Comment NOTIFIED PHYSICIAN    Comment 3            Comment: Due to the release kinetics of cTnI, a negative result within the first hours of the onset of symptoms does not rule out myocardial infarction with certainty. If myocardial infarction is still suspected, repeat the test at appropriate intervals.   Drug screen panel, emergency     Status: Abnormal   Collection Time: 01/26/15  4:47 PM  Result Value Ref Range   Opiates POSITIVE (A) NONE DETECTED   Cocaine POSITIVE (A) NONE DETECTED   Benzodiazepines NONE DETECTED NONE DETECTED   Amphetamines NONE DETECTED NONE DETECTED   Tetrahydrocannabinol NONE DETECTED NONE DETECTED   Barbiturates NONE DETECTED NONE DETECTED    Comment:        DRUG SCREEN FOR MEDICAL PURPOSES ONLY.  IF CONFIRMATION IS NEEDED FOR ANY PURPOSE, NOTIFY LAB WITHIN 5 DAYS.  LOWEST DETECTABLE LIMITS FOR URINE DRUG SCREEN Drug Class       Cutoff (ng/mL) Amphetamine      1000 Barbiturate      200 Benzodiazepine   814 Tricyclics       481 Opiates          300 Cocaine          300 THC              50   POC occult blood, ED     Status: None   Collection Time: 01/26/15  5:24 PM  Result Value Ref Range   Fecal Occult Bld NEGATIVE NEGATIVE  MRSA PCR Screening     Status: None   Collection Time: 01/26/15  9:01 PM  Result Value Ref Range   MRSA by PCR NEGATIVE NEGATIVE    Comment:        The GeneXpert  MRSA Assay (FDA approved for NASAL specimens only), is one component of a comprehensive MRSA colonization surveillance program. It is not intended to diagnose MRSA infection nor to guide or monitor treatment for MRSA infections.   Glucose, capillary     Status: Abnormal   Collection Time: 01/26/15 10:50 PM  Result Value Ref Range   Glucose-Capillary 170 (H) 65 - 99 mg/dL  Troponin I (q 6hr x 3)     Status: Abnormal   Collection Time: 01/26/15 11:35 PM  Result Value Ref Range   Troponin I 0.07 (H) <0.031 ng/mL    Comment:        PERSISTENTLY INCREASED TROPONIN VALUES IN THE RANGE OF 0.04-0.49 ng/mL CAN BE SEEN IN:       -UNSTABLE ANGINA       -CONGESTIVE HEART FAILURE       -MYOCARDITIS       -CHEST TRAUMA       -ARRYHTHMIAS       -LATE PRESENTING MYOCARDIAL INFARCTION       -COPD   CLINICAL FOLLOW-UP RECOMMENDED.    CK total and CKMB (cardiac)     Status: Abnormal   Collection Time: 01/26/15 11:35 PM  Result Value Ref Range   Total CK 137 49 - 397 U/L   CK, MB 5.2 (H) 0.5 - 5.0 ng/mL   Relative Index 3.8 (H) 0.0 - 2.5  Troponin I (q 6hr x 3)     Status: Abnormal   Collection Time: 01/27/15  3:00 AM  Result Value Ref Range   Troponin I 0.06 (H) <0.031 ng/mL    Comment:        PERSISTENTLY INCREASED TROPONIN VALUES IN THE RANGE OF 0.04-0.49 ng/mL CAN BE SEEN IN:       -UNSTABLE ANGINA       -CONGESTIVE HEART FAILURE       -MYOCARDITIS       -CHEST TRAUMA       -ARRYHTHMIAS       -LATE PRESENTING MYOCARDIAL INFARCTION       -COPD   CLINICAL FOLLOW-UP RECOMMENDED.   CBC     Status: Abnormal   Collection Time: 01/27/15  3:00 AM  Result Value Ref Range   WBC 10.4 4.0 - 10.5 K/uL   RBC 2.40 (L) 4.22 - 5.81 MIL/uL   Hemoglobin 7.3 (L) 13.0 - 17.0 g/dL   HCT 21.6 (L) 39.0 - 52.0 %   MCV 90.0 78.0 - 100.0 fL   MCH 30.4 26.0 - 34.0 pg   MCHC 33.8 30.0 - 36.0 g/dL   RDW 15.1  11.5 - 15.5 %   Platelets 352 150 - 400 K/uL  Comprehensive metabolic panel     Status:  Abnormal   Collection Time: 01/27/15  3:00 AM  Result Value Ref Range   Sodium 136 135 - 145 mmol/L   Potassium 4.1 3.5 - 5.1 mmol/L   Chloride 108 101 - 111 mmol/L   CO2 19 (L) 22 - 32 mmol/L   Glucose, Bld 119 (H) 65 - 99 mg/dL   BUN 67 (H) 6 - 20 mg/dL   Creatinine, Ser 6.31 (H) 0.61 - 1.24 mg/dL   Calcium 8.5 (L) 8.9 - 10.3 mg/dL   Total Protein 6.3 (L) 6.5 - 8.1 g/dL   Albumin 2.2 (L) 3.5 - 5.0 g/dL   AST 25 15 - 41 U/L   ALT 40 17 - 63 U/L   Alkaline Phosphatase 188 (H) 38 - 126 U/L   Total Bilirubin 1.1 0.3 - 1.2 mg/dL   GFR calc non Af Amer 9 (L) >60 mL/min   GFR calc Af Amer 11 (L) >60 mL/min    Comment: (NOTE) The eGFR has been calculated using the CKD EPI equation. This calculation has not been validated in all clinical situations. eGFR's persistently <60 mL/min signify possible Chronic Kidney Disease.    Anion gap 9 5 - 15  TSH     Status: None   Collection Time: 01/27/15  3:00 AM  Result Value Ref Range   TSH 0.748 0.350 - 4.500 uIU/mL  Iron and TIBC     Status: Abnormal   Collection Time: 01/27/15  3:00 AM  Result Value Ref Range   Iron 38 (L) 45 - 182 ug/dL   TIBC 185 (L) 250 - 450 ug/dL   Saturation Ratios 21 17.9 - 39.5 %   UIBC 147 ug/dL  Ferritin     Status: None   Collection Time: 01/27/15  3:00 AM  Result Value Ref Range   Ferritin 316 24 - 336 ng/mL  Vitamin B12     Status: None   Collection Time: 01/27/15  3:00 AM  Result Value Ref Range   Vitamin B-12 559 180 - 914 pg/mL    Comment: (NOTE) This assay is not validated for testing neonatal or myeloproliferative syndrome specimens for Vitamin B12 levels.   Sedimentation rate     Status: Abnormal   Collection Time: 01/27/15  3:00 AM  Result Value Ref Range   Sed Rate 103 (H) 0 - 16 mm/hr  Glucose, capillary     Status: Abnormal   Collection Time: 01/27/15  7:35 AM  Result Value Ref Range   Glucose-Capillary 117 (H) 65 - 99 mg/dL  Troponin I (q 6hr x 3)     Status: Abnormal   Collection  Time: 01/27/15 10:11 AM  Result Value Ref Range   Troponin I 0.05 (H) <0.031 ng/mL    Comment:        PERSISTENTLY INCREASED TROPONIN VALUES IN THE RANGE OF 0.04-0.49 ng/mL CAN BE SEEN IN:       -UNSTABLE ANGINA       -CONGESTIVE HEART FAILURE       -MYOCARDITIS       -CHEST TRAUMA       -ARRYHTHMIAS       -LATE PRESENTING MYOCARDIAL INFARCTION       -COPD   CLINICAL FOLLOW-UP RECOMMENDED.      ROS:  A comprehensive review of systems was negative except for: Respiratory: positive for dyspnea on exertion Cardiovascular: positive for chest  pain and fatigue Edema  Physical Exam: Filed Vitals:   01/27/15 1222  BP: 129/71  Pulse: 72  Temp: 97.4 F (36.3 C)  Resp: 12     General: A fatigued male lying in bed. He appears to be flat and depressed. He is agreeable to initiating dialysis therapy HEENT: Pupils are equal round reactive to light, extra ocular motions are intact, mucous membranes are moist Neck: There is jugular venous distention present Heart: Regular rate and rhythm Lungs: Decreased breath sounds at the bases Abdomen: Soft, nontender, nondistended Extremities: Pitting edema- fistula present in right upper arm with a good thrill and bruit Skin: Warm and dry Neuro: Flat but neurologically intact  Assessment/Plan: 54 year old male with stage V CKD at baseline. He now presents with fatigue and volume overload necessitating the initiation of dialysis therapy 1.Renal- Patient with decreased albumin and chronic indications to initiate dialysis. patient is agreeable to initiate dialysis therapy. He has a right upper extremity AV fistula which appears ready for use. We will proceed with getting his first treatment done today, second tomorrow and third on Saturday. We will initiate the procedure to find him an outpatient dialysis unit upon discharge 2. Hypertension/volume  - patient's blood pressures actually excellent right now. He is responding well to Lasix at this time.  However, it is felt that due to his compliant issues he would not be able to maintain his medical regimen. He is currently on Corag which I will continue. He is also on very high-dose hydralazine which I will look to wean as we get his volume status more appropriate with dialysis. 3. Anemia  - is quite anemic and this is been a trend. Whether this is due to a positive SPEP in the past or just due to CK D is unknown. Iron stores are low, will replete and initiate him on an ESA 4. Bone disease related to CKD. He is currently on calcitriol and last PTH as an outpatient was 177. He is on PhosLo as well. These medications will be continued 5. Positive SPEP- this was demonstrated in the past. It has not been worked up. We've respectfully request hematology consult while in-house to get this evaluated. 6. Chest pain- noted to have drug screen positive for cocaine and also was volume overloaded. Troponins only slightly positive. Workup per primary team  Thank you for this consultation. We will continue to follow with you   [name] A 01/27/2015, 12:51 PM

## 2015-01-27 NOTE — Progress Notes (Signed)
TEAM 1 - Stepdown/ICU TEAM Progress Note  [name] WUX:[id number] DOB: [birth year] DOA: 01/26/2015 PCP: Minerva Ends, MD  Admit HPI / Brief Narrative: 54 yo HM PMHx  substance abuse, Diastolic CHF (OZ=36%), hypertension, DM type 2, cocaine abuse. Presented with SOB and swelling of the legs for the past couple of days. Pt notes that he had sscp, " sharp" radiating to the neck. Pt states that pain constant and associate with n/v. Pt notes that he had some blood w/ emesis after multiple episodes of n/v. Pt denies NSAID use. Pt states that pain has improved. CXR =>negative ekg => nsr, No st -t changes c/w acute ischemia. pt will be admitted for chest pain r/o.     HPI/Subjective: 5/26 states negative homicidal/suicidal, states made the comment about shooting himself after drinking significant amount of beer and brandy. Currently very calm, no thoughts of harming himself or others. States had one episode of hematocrit emesis after throwing up the/brandy here been drinking. No further incident. No melanoma/bright red blood per rectum. Negative CP, negative SOB, negative abdominal pain. States only concern is he is hungry.  Assessment/Plan: Substance abuse;  -positive for cocaine and opiates. Alcohol negative  Suicidal ideation -Appears that patient made the threat while intoxicated. Currently appears calm without any thoughts of suicidal/homicidal ideation however will have psychiatry formally evaluate  Acute diastolic CHF -From admission note 09/06/2013 patient was admitted by cardiology for acute decompensated diastolic CHF with EF of 64% -Echocardiogram pending  Elevated troponin -Most likely secondary to demand ischemia, cocaine use  Anemia Check iron studies, b12, folate, spep, upep  ESRD -Spoke with Dr.Kellie Moshe Cipro (nephrology) they plan on performing HD today.     Code Status: FULL Family Communication: no family present at time of  exam Disposition Plan: Per psychiatry    Consultants: Dr.Kellie Moshe Cipro (nephrology)   Procedure/Significant Events:    Culture   Antibiotics:   DVT prophylaxis: SCD   Devices    LINES / TUBES:      Continuous Infusions: . pantoprozole (PROTONIX) infusion 8 mg/hr (01/27/15 1907)    Objective: VITAL SIGNS: Temp: 98.4 F (36.9 C) (05/26 2000) Temp Source: Oral (05/26 2000) BP: 116/59 mmHg (05/26 2000) Pulse Rate: 77 (05/26 2000) SPO2; FIO2:   Intake/Output Summary (Last 24 hours) at 01/27/15 2108 Last data filed at 01/27/15 2000  Gross per 24 hour  Intake 564.25 ml  Output   3150 ml  Net -2585.75 ml     Exam: General: A/O 4, NAD, No acute respiratory distress Eyes: Negative headache, eye pain, double vision, scotomas, floaters, negative retinal hemorrhage ENT: Negative Runny nose, negative ear pain, negative tinnitus, negative gingival bleeding,  Neck:  Negative scars, masses, torticollis, lymphadenopathy, JVD Lungs: Clear to auscultation bilaterally without wheezes or crackles Cardiovascular: Regular rate and rhythm without murmur gallop or rub normal S1 and S2 Abdomen:negative abdominal pain, negative dysphagia, Nontender, nondistended, soft, bowel sounds positive, no rebound, no ascites, no appreciable mass Extremities: No significant cyanosis, clubbing. Bilateral lower extremity edemaLt >> Rt 2+; AV fistula with good thrill on the right antecubital. Psychiatric:  Negative depression, negative anxiety, negative fatigue, negative mania currently very calm, states no desire to commit suicide/homicide. Neurologic:  Cranial nerves II through XII intact, tongue/uvula midline, all extremities muscle strength 5/5, sensation intact throughout, , negative dysarthria, negative expressive aphasia, negative receptive aphasia.     Data Reviewed: Basic Metabolic Panel:  Recent Labs Lab 01/26/15 1615 01/27/15 0300  NA 134* 136  K 4.4  4.1  CL 107  108  CO2 16* 19*  GLUCOSE 156* 119*  BUN 63* 67*  CREATININE 5.96* 6.31*  CALCIUM 8.8* 8.5*   Liver Function Tests:  Recent Labs Lab 01/26/15 1615 01/27/15 0300  AST 40 25  ALT 51 40  ALKPHOS 225* 188*  BILITOT 1.6* 1.1  PROT 6.9 6.3*  ALBUMIN 2.5* 2.2*   No results for input(s): LIPASE, AMYLASE in the last 168 hours. No results for input(s): AMMONIA in the last 168 hours. CBC:  Recent Labs Lab 01/26/15 1605 01/27/15 0300  WBC 11.8* 10.4  HGB 8.3* 7.3*  HCT 24.7* 21.6*  MCV 91.8 90.0  PLT 397 352   Cardiac Enzymes:  Recent Labs Lab 01/26/15 2335 01/27/15 0300 01/27/15 1011  CKTOTAL 137  --   --   CKMB 5.2*  --   --   TROPONINI 0.07* 0.06* 0.05*   BNP (last 3 results)  Recent Labs  09/14/14 1228 11/29/14 1025 01/26/15 1615  BNP 174.3* 1981.6* 2258.1*    ProBNP (last 3 results)  Recent Labs  08/04/14 1007  PROBNP 52226.0*    CBG:  Recent Labs Lab 01/26/15 2250 01/27/15 0735 01/27/15 1218 01/27/15 1824  GLUCAP 170* 117* 126* 146*    Recent Results (from the past 240 hour(s))  MRSA PCR Screening     Status: None   Collection Time: 01/26/15  9:01 PM  Result Value Ref Range Status   MRSA by PCR NEGATIVE NEGATIVE Final    Comment:        The GeneXpert MRSA Assay (FDA approved for NASAL specimens only), is one component of a comprehensive MRSA colonization surveillance program. It is not intended to diagnose MRSA infection nor to guide or monitor treatment for MRSA infections.      Studies:  Recent x-ray studies have been reviewed in detail by the Attending Physician  Scheduled Meds:  Scheduled Meds: . aspirin  325 mg Oral Daily  . atorvastatin  80 mg Oral q1800  . calcitRIOL  0.25 mcg Oral Daily  . calcium acetate  1,334 mg Oral TID WC  . carvedilol  25 mg Oral BID WC  . [START ON 01/28/2015] darbepoetin (ARANESP) injection - DIALYSIS  150 mcg Intravenous Q Fri-HD  . ferric gluconate (FERRLECIT/NULECIT) IV  125 mg  Intravenous Daily  . heparin  5,000 Units Subcutaneous 3 times per day  . insulin aspart  0-5 Units Subcutaneous QHS  . insulin aspart  0-9 Units Subcutaneous TID WC  . montelukast  10 mg Oral QHS  . [START ON 01/30/2015] pantoprazole (PROTONIX) IV  40 mg Intravenous Q12H  . sodium chloride  3 mL Intravenous Q12H  . sodium chloride  3 mL Intravenous Q12H    Time spent on care of this patient: 40 mins   WOODS, Geraldo Docker , MD  [facility] Office  [phone] Pager - [phone]  On-Call/Text Page:      [email or web address]      password [email or web address]  If 7PM-7AM, please contact night-coverage [email or web address] Password [email or web address] 01/27/2015, 9:08 PM   LOS: 1 day   Care during the described time interval was provided by me .  I have reviewed this patient's available data, including medical history, events of note, physical examination, and all test results as part of my evaluation. I have personally reviewed and interpreted all radiology studies.   Dia Crawford, MD [phone] Pager

## 2015-01-27 NOTE — Progress Notes (Signed)
Echocardiogram 2D Echocardiogram has been performed.  [name] 01/27/2015, 10:41 AM

## 2015-01-27 NOTE — Progress Notes (Signed)
Utilization Review Completed.  

## 2015-01-27 NOTE — Procedures (Signed)
Patient was seen on dialysis and the procedure was supervised.  BFR 200  Via AVF BP is  133/73.   Patient appears to be tolerating treatment well- first treatment ever  [name] A 01/27/2015

## 2015-01-28 ENCOUNTER — Telehealth: Payer: Self-pay | Admitting: *Deleted

## 2015-01-28 DIAGNOSIS — F0630 Mood disorder due to known physiological condition, unspecified: Secondary | ICD-10-CM

## 2015-01-28 DIAGNOSIS — N184 Chronic kidney disease, stage 4 (severe): Secondary | ICD-10-CM

## 2015-01-28 DIAGNOSIS — D631 Anemia in chronic kidney disease: Secondary | ICD-10-CM

## 2015-01-28 DIAGNOSIS — E1129 Type 2 diabetes mellitus with other diabetic kidney complication: Secondary | ICD-10-CM

## 2015-01-28 DIAGNOSIS — E1165 Type 2 diabetes mellitus with hyperglycemia: Secondary | ICD-10-CM

## 2015-01-28 DIAGNOSIS — F1994 Other psychoactive substance use, unspecified with psychoactive substance-induced mood disorder: Secondary | ICD-10-CM

## 2015-01-28 LAB — CBC
HCT: 24.7 % — ABNORMAL LOW (ref 39.0–52.0)
Hemoglobin: 8 g/dL — ABNORMAL LOW (ref 13.0–17.0)
MCH: 29.7 pg (ref 26.0–34.0)
MCHC: 32.4 g/dL (ref 30.0–36.0)
MCV: 91.8 fL (ref 78.0–100.0)
Platelets: 409 10*3/uL — ABNORMAL HIGH (ref 150–400)
RBC: 2.69 MIL/uL — ABNORMAL LOW (ref 4.22–5.81)
RDW: 15.5 % (ref 11.5–15.5)
WBC: 9.5 10*3/uL (ref 4.0–10.5)

## 2015-01-28 LAB — HEPATITIS B SURFACE ANTIGEN: Hepatitis B Surface Ag: NEGATIVE

## 2015-01-28 LAB — RENAL FUNCTION PANEL
ALBUMIN: 2.3 g/dL — AB (ref 3.5–5.0)
ANION GAP: 10 (ref 5–15)
BUN: 49 mg/dL — AB (ref 6–20)
CALCIUM: 8.4 mg/dL — AB (ref 8.9–10.3)
CO2: 20 mmol/L — AB (ref 22–32)
Chloride: 103 mmol/L (ref 101–111)
Creatinine, Ser: 5.55 mg/dL — ABNORMAL HIGH (ref 0.61–1.24)
GFR calc Af Amer: 12 mL/min — ABNORMAL LOW (ref >60)
GFR calc non Af Amer: 11 mL/min — ABNORMAL LOW (ref >60)
Glucose, Bld: 189 mg/dL — ABNORMAL HIGH (ref 65–99)
PHOSPHORUS: 5 mg/dL — AB (ref 2.5–4.6)
Potassium: 4.5 mmol/L (ref 3.5–5.1)
Sodium: 133 mmol/L — ABNORMAL LOW (ref 135–145)

## 2015-01-28 LAB — GLUCOSE, CAPILLARY
GLUCOSE-CAPILLARY: 119 mg/dL — AB (ref 65–99)
Glucose-Capillary: 141 mg/dL — ABNORMAL HIGH (ref 65–99)
Glucose-Capillary: 148 mg/dL — ABNORMAL HIGH (ref 65–99)

## 2015-01-28 LAB — HEPATITIS B SURFACE ANTIBODY,QUALITATIVE: Hep B S Ab: NEGATIVE

## 2015-01-28 LAB — FOLATE RBC
Folate, Hemolysate: 329.4 ng/mL
Folate, RBC: 1484 ng/mL
Hematocrit: 22.2 % — ABNORMAL LOW (ref 37.5–51.0)

## 2015-01-28 MED ORDER — SODIUM CHLORIDE 0.9 % IV SOLN: 100.0000 mL | INTRAVENOUS | Status: DC | PRN

## 2015-01-28 MED ORDER — NEPRO/CARBSTEADY PO LIQD
237.0000 mL | ORAL | Status: DC | PRN
  Filled 2015-01-28: qty 237

## 2015-01-28 MED ORDER — PENTAFLUOROPROP-TETRAFLUOROETH EX AERO: 1.0000 "application " | INHALATION_SPRAY | CUTANEOUS | Status: DC | PRN

## 2015-01-28 MED ORDER — HEPARIN SODIUM (PORCINE) 1000 UNIT/ML DIALYSIS
1000.0000 [IU] | INTRAMUSCULAR | Status: DC | PRN
  Filled 2015-01-28: qty 1

## 2015-01-28 MED ORDER — FAMOTIDINE 40 MG PO TABS
40.0000 mg | ORAL_TABLET | Freq: Two times a day (BID) | ORAL | Status: DC
  Administered 2015-01-28: 40 mg via ORAL
  Filled 2015-01-28 (×3): qty 1

## 2015-01-28 MED ORDER — FLUOXETINE HCL 10 MG PO CAPS
10.0000 mg | ORAL_CAPSULE | Freq: Every day | ORAL | Status: DC
  Administered 2015-01-28 – 2015-02-01 (×4): 10 mg via ORAL
  Filled 2015-01-28 (×5): qty 1

## 2015-01-28 MED ORDER — LIDOCAINE-PRILOCAINE 2.5-2.5 % EX CREA
1.0000 "application " | TOPICAL_CREAM | CUTANEOUS | Status: DC | PRN
  Filled 2015-01-28: qty 5

## 2015-01-28 MED ORDER — DARBEPOETIN ALFA 150 MCG/0.3ML IJ SOSY
PREFILLED_SYRINGE | INTRAMUSCULAR | Status: AC
  Filled 2015-01-28: qty 0.3

## 2015-01-28 MED ORDER — ALTEPLASE 2 MG IJ SOLR
2.0000 mg | Freq: Once | INTRAMUSCULAR | Status: AC | PRN
  Filled 2015-01-28: qty 2

## 2015-01-28 MED ORDER — LIDOCAINE HCL (PF) 1 % IJ SOLN: 5.0000 mL | INTRAMUSCULAR | Status: DC | PRN

## 2015-01-28 MED ORDER — ALTEPLASE 2 MG IJ SOLR
2.0000 mg | Freq: Once | INTRAMUSCULAR | Status: DC | PRN
  Filled 2015-01-28: qty 2

## 2015-01-28 MED ORDER — HEPARIN SODIUM (PORCINE) 1000 UNIT/ML DIALYSIS: 1000.0000 [IU] | INTRAMUSCULAR | Status: DC | PRN

## 2015-01-28 NOTE — Progress Notes (Signed)
Report called to [place] on unit 6E.Patient to transfer to unit 6E bed 5.

## 2015-01-28 NOTE — Telephone Encounter (Signed)
Received office notes from [facility], sent to scan.

## 2015-01-28 NOTE — Progress Notes (Signed)
01/28/2015 8:56 PM  Patient arrived at unit via wheelchair from 2c with two nurse techs. Patient alert and oriented to staff, unit, and room. Patient shows no signs of distress. RN will continue to monitor patient.   Ermalinda Memos, Chief Strategy Officer Phone [phone]

## 2015-01-28 NOTE — Progress Notes (Addendum)
TEAM 1 - Stepdown/ICU TEAM Progress Note  [name] YDX:[id number] DOB: Jul 05, 1961 DOA: 01/26/2015 PCP: Minerva Ends, MD  Admit HPI / Brief Narrative: 54 yo M Hx substance abuse, Diastolic CHF, hypertension, DM2, and cocaine abuse who presented with SOB and swelling of the legs for a couple of days. Pt noted sscp, " sharp" radiating to the neck. Pt states that pain was constant and associate with n/v. Pt noted some blood w/ emesis after multiple episodes of n/v.In the ED a CXR was negative and an ekg noted nsr, no st -t changes c/w acute ischemia.  HPI/Subjective: The patient is seen during hemodialysis.  He has no complaints whatsoever apart from being extremely hungry.  He denies chest pain shortness of breath nausea vomiting or abdominal pain.  He states that all the symptoms he was complaining of at his presentation have spontaneously resolved.  Assessment/Plan:  Substance abuse -positive for cocaine and opiates - the patient has been advised to abstain from all substances of abuse  Suicidal ideation -Appears that patient made the threat while intoxicated - currently calm without thoughts of suicidal/homicidal ideation - formal psych evaluation requested with results pending  Positive SPEP (December) Nephrology has apparently consulted Hematology for an evaluation  ESRD - new start HD  Per nephrology the patient's renal function has rapidly and acutely deteriorated  Acute diastolic CHF -03/04/946 patient was admitted for acute decompensated diastolic CHF with EF of 09% -F/U TTE pending   Elevated troponin -Most likely secondary to cocaine use in the setting of ESRD - chest pain was not anginal in character and has spontaneously resolved - patient advised to abstain completely and absolutely from cocaine  DM2 CBG currently reasonably controlled - check A1c  Anemia Anemia panel suggestive of anemia related to chronic kidney disease - folic acid  still pending - fecal occult blood negative   Code Status: FULL Family Communication: no family present at time of exam Disposition Plan: Stable for transfer to nephrology floor - await results of psychiatry consultation - await results of hematology consultation - ongoing care of ESRD per nephrology  Consultants: Nephrology Psych   Procedures: TTE - pending   Antibiotics: none  DVT prophylaxis: SCD  Objective: Blood pressure 134/76, pulse 75, temperature 97.4 F (36.3 C), temperature source Oral, resp. rate 9, height 5\' 11"  (1.803 m), weight 86 kg (189 lb 9.5 oz), SpO2 100 %.  Intake/Output Summary (Last 24 hours) at 01/28/15 1509 Last data filed at 01/28/15 1144  Gross per 24 hour  Intake    550 ml  Output   2375 ml  Net  -1825 ml   Exam: General: A/O 4, NAD, No acute respiratory distress Eyes: Negative headache, eye pain, double vision, scotomas, floaters, negative retinal hemorrhage ENT: Negative Runny nose, negative ear pain, negative tinnitus, negative gingival bleeding,  Neck:  Negative scars, masses, torticollis, lymphadenopathy, JVD Lungs: Clear to auscultation bilaterally without wheezes or crackles Cardiovascular: Regular rate and rhythm without murmur gallop or rub normal S1 and S2 Abdomen:negative abdominal pain, negative dysphagia, Nontender, nondistended, soft, bowel sounds positive, no rebound, no ascites, no appreciable mass Extremities: No significant cyanosis, clubbing. Bilateral lower extremity edemaLt >> Rt 2+; AV fistula with good thrill on the right antecubital. Psychiatric:  Negative depression, negative anxiety, negative fatigue, negative mania currently very calm, states no desire to commit suicide/homicide. Neurologic:  Cranial nerves II through XII intact, tongue/uvula midline, all extremities muscle strength 5/5, sensation intact throughout, , negative dysarthria, negative expressive aphasia,  negative receptive aphasia.   Data Reviewed: Basic  Metabolic Panel:  Recent Labs Lab 01/26/15 1615 01/27/15 0300  NA 134* 136  K 4.4 4.1  CL 107 108  CO2 16* 19*  GLUCOSE 156* 119*  BUN 63* 67*  CREATININE 5.96* 6.31*  CALCIUM 8.8* 8.5*   Liver Function Tests:  Recent Labs Lab 01/26/15 1615 01/27/15 0300  AST 40 25  ALT 51 40  ALKPHOS 225* 188*  BILITOT 1.6* 1.1  PROT 6.9 6.3*  ALBUMIN 2.5* 2.2*   CBC:  Recent Labs Lab 01/26/15 1605 01/27/15 0300 01/28/15 1430  WBC 11.8* 10.4 9.5  HGB 8.3* 7.3* 8.0*  HCT 24.7* 21.6* 24.7*  MCV 91.8 90.0 91.8  PLT 397 352 409*   Cardiac Enzymes:  Recent Labs Lab 01/26/15 2335 01/27/15 0300 01/27/15 1011  CKTOTAL 137  --   --   CKMB 5.2*  --   --   TROPONINI 0.07* 0.06* 0.05*   BNP (last 3 results)  Recent Labs  09/14/14 1228 11/29/14 1025 01/26/15 1615  BNP 174.3* 1981.6* 2258.1*    ProBNP (last 3 results)  Recent Labs  08/04/14 1007  PROBNP 52226.0*    CBG:  Recent Labs Lab 01/27/15 1218 01/27/15 1824 01/27/15 2243 01/28/15 0824 01/28/15 1142  GLUCAP 126* 146* 143* 119* 141*    Recent Results (from the past 240 hour(s))  MRSA PCR Screening     Status: None   Collection Time: 01/26/15  9:01 PM  Result Value Ref Range Status   MRSA by PCR NEGATIVE NEGATIVE Final    Comment:        The GeneXpert MRSA Assay (FDA approved for NASAL specimens only), is one component of a comprehensive MRSA colonization surveillance program. It is not intended to diagnose MRSA infection nor to guide or monitor treatment for MRSA infections.      Studies:  Recent x-ray studies have been reviewed in detail by the Attending Physician  Scheduled Meds:  Scheduled Meds: . aspirin  325 mg Oral Daily  . atorvastatin  80 mg Oral q1800  . calcitRIOL  0.25 mcg Oral Daily  . calcium acetate  1,334 mg Oral TID WC  . carvedilol  25 mg Oral BID WC  . Darbepoetin Alfa      . darbepoetin (ARANESP) injection - DIALYSIS  150 mcg Intravenous Q Fri-HD  . ferric  gluconate (FERRLECIT/NULECIT) IV  125 mg Intravenous Daily  . heparin  5,000 Units Subcutaneous 3 times per day  . insulin aspart  0-5 Units Subcutaneous QHS  . insulin aspart  0-9 Units Subcutaneous TID WC  . montelukast  10 mg Oral QHS  . [START ON 01/30/2015] pantoprazole (PROTONIX) IV  40 mg Intravenous Q12H    Time spent on care of this patient: 35 mins  Cherene Altes, MD [facility] For Consults/Admissions - Flow Manager - [phone] Office  [phone]  Contact MD directly via text page:      [email or web address]      password [facility]  01/28/2015, 3:09 PM   LOS: 2 days

## 2015-01-28 NOTE — Progress Notes (Signed)
[facility] ROUNDING NOTE   Subjective:   Interval History: no complaints  Objective:  Vital signs in last 24 hours:  Temp:  [97.1 F (36.2 C)-98.5 F (36.9 C)] 97.4 F (36.3 C) (05/27 0727) Pulse Rate:  [68-79] 73 (05/27 0727) Resp:  [12-18] 17 (05/27 0727) BP: (102-151)/(55-82) 128/67 mmHg (05/27 0727) SpO2:  [97 %-99 %] 97 % (05/27 0400) Weight:  [84.6 kg (186 lb 8.2 oz)-86.4 kg (190 lb 7.6 oz)] 84.6 kg (186 lb 8.2 oz) (05/27 0500)  Weight change: 0.217 kg (7.6 oz) Filed Weights   01/27/15 1548 01/27/15 1748 01/28/15 0500  Weight: 86.4 kg (190 lb 7.6 oz) 85 kg (187 lb 6.3 oz) 84.6 kg (186 lb 8.2 oz)    Intake/Output: I/O last 3 completed shifts: In: 814.3 [I.V.:814.3] Out: 3750 [Urine:2750; Other:1000]   Intake/Output this shift:  Total I/O In: -  Out: 375 [Urine:375]  CVS- RRR RS- CTA ABD- BS present soft non-distended EXT- no edema   Basic Metabolic Panel:  Recent Labs Lab 01/26/15 1615 01/27/15 0300  NA 134* 136  K 4.4 4.1  CL 107 108  CO2 16* 19*  GLUCOSE 156* 119*  BUN 63* 67*  CREATININE 5.96* 6.31*  CALCIUM 8.8* 8.5*    Liver Function Tests:  Recent Labs Lab 01/26/15 1615 01/27/15 0300  AST 40 25  ALT 51 40  ALKPHOS 225* 188*  BILITOT 1.6* 1.1  PROT 6.9 6.3*  ALBUMIN 2.5* 2.2*   No results for input(s): LIPASE, AMYLASE in the last 168 hours. No results for input(s): AMMONIA in the last 168 hours.  CBC:  Recent Labs Lab 01/26/15 1605 01/27/15 0300  WBC 11.8* 10.4  HGB 8.3* 7.3*  HCT 24.7* 21.6*  MCV 91.8 90.0  PLT 397 352    Cardiac Enzymes:  Recent Labs Lab 01/26/15 2335 01/27/15 0300 01/27/15 1011  CKTOTAL 137  --   --   CKMB 5.2*  --   --   TROPONINI 0.07* 0.06* 0.05*    BNP: Invalid input(s): POCBNP  CBG:  Recent Labs Lab 01/27/15 0735 01/27/15 1218 01/27/15 1824 01/27/15 2243 01/28/15 0824  GLUCAP 117* 126* 146* 143* 119*    Microbiology: Results for orders placed or performed  during the hospital encounter of 01/26/15  MRSA PCR Screening     Status: None   Collection Time: 01/26/15  9:01 PM  Result Value Ref Range Status   MRSA by PCR NEGATIVE NEGATIVE Final    Comment:        The GeneXpert MRSA Assay (FDA approved for NASAL specimens only), is one component of a comprehensive MRSA colonization surveillance program. It is not intended to diagnose MRSA infection nor to guide or monitor treatment for MRSA infections.     Coagulation Studies: No results for input(s): LABPROT, INR in the last 72 hours.  Urinalysis: No results for input(s): COLORURINE, LABSPEC, PHURINE, GLUCOSEU, HGBUR, BILIRUBINUR, KETONESUR, PROTEINUR, UROBILINOGEN, NITRITE, LEUKOCYTESUR in the last 72 hours.  Invalid input(s): APPERANCEUR    Imaging: Dg Chest 2 View  01/26/2015   CLINICAL DATA:  One day history of shortness of breath  EXAM: CHEST  2 VIEW  COMPARISON:  November 29, 2014  FINDINGS: There is no appreciable edema or consolidation. Heart size and pulmonary vascularity are within normal limits. No adenopathy. There is mild degenerative change in the thoracic spine.  IMPRESSION: No edema or consolidation.   Electronically Signed   By: Lowella Grip III M.D.   On: 01/26/2015 16:54  Medications:   . pantoprozole (PROTONIX) infusion 8 mg/hr (01/27/15 1907)   . aspirin  325 mg Oral Daily  . atorvastatin  80 mg Oral q1800  . calcitRIOL  0.25 mcg Oral Daily  . calcium acetate  1,334 mg Oral TID WC  . carvedilol  25 mg Oral BID WC  . darbepoetin (ARANESP) injection - DIALYSIS  150 mcg Intravenous Q Fri-HD  . ferric gluconate (FERRLECIT/NULECIT) IV  125 mg Intravenous Daily  . heparin  5,000 Units Subcutaneous 3 times per day  . insulin aspart  0-5 Units Subcutaneous QHS  . insulin aspart  0-9 Units Subcutaneous TID WC  . montelukast  10 mg Oral QHS  . [START ON 01/30/2015] pantoprazole (PROTONIX) IV  40 mg Intravenous Q12H   acetaminophen-codeine  Assessment/ Plan:   El Pile is a 54 y.o. male with past medical history significant for hypertension, diabetes mellitus, peripheral arterial disease as well as quickly progressive chronic kidney disease due to above. Courses also been complicated by noncompliance and substance abuse.SPEP which was positive and possible suicidal ID ideation  1. ESRD  New start     Dialysis #2 today   CLIP 2. Hypertension/volume -  Continue to challenge  3. Anemia - Iron and Aranesp    4. Bone disease related to CKD.  Binders and vitamin D 5. Positive SPEP-  Heme to see      LOS: 2 Allister Lessley W @TODAY @11 :21 AM

## 2015-01-28 NOTE — Consult Note (Signed)
[facility] Face-to-Face Psychiatry Consult   Reason for Consult:  Depression, substance abuse and suicide threats Referring Physician:  Dr. Thereasa Solo Patient Identification: [name] MRN:  [id number] Principal Diagnosis: Psychoactive substance-induced mood disorder Diagnosis:   Patient Active Problem List   Diagnosis Date Noted  . Psychoactive substance-induced mood disorder [F19.94, F06.30] 01/28/2015  . Substance abuse [F19.10]   . Suicidal ideation [R45.851]   . Acute diastolic CHF (congestive heart failure) [I50.31]   . Elevated troponin [R79.89]   . End-stage renal disease on hemodialysis [N18.6, Z99.2]   . CHF exacerbation [I50.9] 01/26/2015  . CHF (congestive heart failure) [I50.9] 01/26/2015  . IgA monoclonal gammopathy [D47.2] 12/28/2014  . GERD (gastroesophageal reflux disease) [K21.9] 11/08/2014  . Healthcare maintenance [Z00.00] 11/08/2014  . CKD (chronic kidney disease), stage IV [N18.4] 09/08/2013  . Chronic diastolic HF (heart failure), NYHA class 4 [I50.32] 09/06/2013  . DM (diabetes mellitus), type 2, uncontrolled, with renal complications [K53.97, Q73.41] 09/06/2013  . Hypertension [I10] 08/27/2011  . Anemia of chronic renal failure, stage 4 (severe) [N18.4, D63.1] 08/27/2011    Total Time spent with patient: 45 minutes  Subjective:   [name] is a 54 y.o. male patient admitted with cocaine intoxication.  HPI: [name] is a 54 years old male admitted to [place]. for shortness of breath swelling of his legs and sharp radiating pain to his neck associated with nausea and vomiting. Patient reported he was made a statement about suicide when he was intoxicated but he does not mean to participate. Patient also reported he has been smoking cocaine 1-2 times a week for the last 4-8 weeks and planning to quit. 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 Patient feels he is not to provide to his family any longer and his fiance has been working hard in providing for the home which makes him depressed sad and then self-medicating with drug of abuse which is cocaine. Patient also reported he occasionally drinks but not abusing or dependent on alcohol. Patient is willing to follow up outpatient psychiatric services and constant services also willing to take medication for depression. Patient contract for safety and denies current suicidal/homicidal ideation, intention or plan. Patient has no evidence of psychosis. Patient Denies Current chest pain, shortness of breath, dizziness, nausea, vomiting and diarrhea. UDS is positive for opiates and cocaine.   Past psychiatric history patient denied history of mental health treatment either inpatient or outpatient. Patient has no history of substance abuse treatment   Social history: he lives with his fiance who works and provides for the home. Patient has a 2 young children and wanted to be there for them to see grew up and support them.  HPI Elements:   Location:  Depression and substance abuse. Quality:  Unable to function both at home and work. Severity:  Chest pain secondary to substance abuse and heart problems. Timing:  Made a suicidal statement when intoxicated. Duration:  3-4 months. Context:  Decrease psychosocial functioning and feeling worthless and depressed.  Past Medical History:  Past Medical History  Diagnosis Date  . PVD (peripheral vascular disease)   . Shortness of breath dyspnea   . Constipation   . Anemia     unsure  . CHF (congestive heart failure) 2015  . Diastolic congestive heart  failure   . Diabetes mellitus 2015    type 2  . GERD (gastroesophageal reflux disease)     unsure  . Hypertension 2015  . Chronic renal disease,  stage IV 2015  . Cocaine abuse   . Cellulitis and abscess of foot 08/24/2011  . Protein calorie malnutrition     Past Surgical History  Procedure Laterality Date  . Leg surgery    . Knee surgery Right   . Amputation  09/06/2011    Procedure: AMPUTATION RAY;  Surgeon: Wylene Simmer, MD;  Location: [place];  Service: Orthopedics;  Laterality: Left;  Left [place] Amputation  . Av fistula placement Left 08/12/2014    Procedure: CREATION OF A BRACHIOCEPHALIC ARTERIOVENOUS (AV) FISTULA  LEFT ARM;  Surgeon: Mal Misty, MD;  Location: [place];  Service: Vascular;  Laterality: Left;  . Av fistula placement Right 10/07/2014    Procedure: Creation Right Arm Arteriovenous fistula;  Surgeon: Mal Misty, MD;  Location: [place];  Service: Vascular;  Laterality: Right;  . Appendectomy     Family History:  Family History  Problem Relation Age of Onset  . Adopted: Yes  . Varicose Veins Mother   . Varicose Veins Sister    Social History:  History  Alcohol Use No     History  Drug Use No    Comment: Pt reports last use of cocaine was "last month"   As of 10/06/14  last was 2 months ago    History   Social History  . Marital Status: Legally Separated    Spouse Name: N/A  . Number of Children: N/A  . Years of Education: N/A   Social History Main Topics  . Smoking status: Never Smoker   . Smokeless tobacco: Never Used  . Alcohol Use: No  . Drug Use: No     Comment: Pt reports last use of cocaine was "last month"   As of 10/06/14  last was 2 months ago  . Sexual Activity: Not on file   Other Topics Concern  . None   Social History Narrative   Additional Social History: patient lives at home with fianc. Patient used to live in [place] came to [place] about 6 years ago related to job transfer.                            Allergies:  No Known Allergies  Labs:  Results for orders placed or performed during the hospital encounter of 01/26/15 (from the past 48 hour(s))  CBC      Status: Abnormal   Collection Time: 01/26/15  4:05 PM  Result Value Ref Range   WBC 11.8 (H) 4.0 - 10.5 K/uL   RBC 2.69 (L) 4.22 - 5.81 MIL/uL   Hemoglobin 8.3 (L) 13.0 - 17.0 g/dL   HCT 24.7 (L) 39.0 - 52.0 %   MCV 91.8 78.0 - 100.0 fL   MCH 30.9 26.0 - 34.0 pg   MCHC 33.6 30.0 - 36.0 g/dL   RDW 15.5 11.5 - 15.5 %   Platelets 397 150 - 400 K/uL  BNP (order ONLY if patient complains of dyspnea/SOB AND you have documented it for THIS visit)     Status: Abnormal   Collection Time: 01/26/15  4:15 PM  Result Value Ref Range   B Natriuretic Peptide 2258.1 (H) 0.0 - 100.0 pg/mL  Comprehensive metabolic panel     Status: Abnormal   Collection Time:  01/26/15  4:15 PM  Result Value Ref Range   Sodium 134 (L) 135 - 145 mmol/L   Potassium 4.4 3.5 - 5.1 mmol/L   Chloride 107 101 - 111 mmol/L   CO2 16 (L) 22 - 32 mmol/L   Glucose, Bld 156 (H) 65 - 99 mg/dL   BUN 63 (H) 6 - 20 mg/dL   Creatinine, Ser 5.96 (H) 0.61 - 1.24 mg/dL   Calcium 8.8 (L) 8.9 - 10.3 mg/dL   Total Protein 6.9 6.5 - 8.1 g/dL   Albumin 2.5 (L) 3.5 - 5.0 g/dL   AST 40 15 - 41 U/L   ALT 51 17 - 63 U/L   Alkaline Phosphatase 225 (H) 38 - 126 U/L   Total Bilirubin 1.6 (H) 0.3 - 1.2 mg/dL   GFR calc non Af Amer 10 (L) >60 mL/min   GFR calc Af Amer 11 (L) >60 mL/min    Comment: (NOTE) The eGFR has been calculated using the CKD EPI equation. This calculation has not been validated in all clinical situations. eGFR's persistently <60 mL/min signify possible Chronic Kidney Disease.    Anion gap 11 5 - 15  Ethanol     Status: None   Collection Time: 01/26/15  4:15 PM  Result Value Ref Range   Alcohol, Ethyl (B) <5 <5 mg/dL    Comment:        LOWEST DETECTABLE LIMIT FOR SERUM ALCOHOL IS 11 mg/dL FOR MEDICAL PURPOSES ONLY   I-stat troponin, ED  (not at [facility], [facility])     Status: Abnormal   Collection Time: 01/26/15  4:31 PM  Result Value Ref Range   Troponin i, poc 0.10 (HH) 0.00 - 0.08 ng/mL   Comment NOTIFIED PHYSICIAN     Comment 3            Comment: Due to the release kinetics of cTnI, a negative result within the first hours of the onset of symptoms does not rule out myocardial infarction with certainty. If myocardial infarction is still suspected, repeat the test at appropriate intervals.   Drug screen panel, emergency     Status: Abnormal   Collection Time: 01/26/15  4:47 PM  Result Value Ref Range   Opiates POSITIVE (A) NONE DETECTED   Cocaine POSITIVE (A) NONE DETECTED   Benzodiazepines NONE DETECTED NONE DETECTED   Amphetamines NONE DETECTED NONE DETECTED   Tetrahydrocannabinol NONE DETECTED NONE DETECTED   Barbiturates NONE DETECTED NONE DETECTED    Comment:        DRUG SCREEN FOR MEDICAL PURPOSES ONLY.  IF CONFIRMATION IS NEEDED FOR ANY PURPOSE, NOTIFY LAB WITHIN 5 DAYS.        LOWEST DETECTABLE LIMITS FOR URINE DRUG SCREEN Drug Class       Cutoff (ng/mL) Amphetamine      1000 Barbiturate      200 Benzodiazepine   789 Tricyclics       381 Opiates          300 Cocaine          300 THC              50   POC occult blood, ED     Status: None   Collection Time: 01/26/15  5:24 PM  Result Value Ref Range   Fecal Occult Bld NEGATIVE NEGATIVE  MRSA PCR Screening     Status: None   Collection Time: 01/26/15  9:01 PM  Result Value Ref Range   MRSA by PCR NEGATIVE NEGATIVE  Comment:        The GeneXpert MRSA Assay (FDA approved for NASAL specimens only), is one component of a comprehensive MRSA colonization surveillance program. It is not intended to diagnose MRSA infection nor to guide or monitor treatment for MRSA infections.   Glucose, capillary     Status: Abnormal   Collection Time: 01/26/15 10:50 PM  Result Value Ref Range   Glucose-Capillary 170 (H) 65 - 99 mg/dL  Troponin I (q 6hr x 3)     Status: Abnormal   Collection Time: 01/26/15 11:35 PM  Result Value Ref Range   Troponin I 0.07 (H) <0.031 ng/mL    Comment:        PERSISTENTLY INCREASED TROPONIN VALUES IN  THE RANGE OF 0.04-0.49 ng/mL CAN BE SEEN IN:       -UNSTABLE ANGINA       -CONGESTIVE HEART FAILURE       -MYOCARDITIS       -CHEST TRAUMA       -ARRYHTHMIAS       -LATE PRESENTING MYOCARDIAL INFARCTION       -COPD   CLINICAL FOLLOW-UP RECOMMENDED.    CK total and CKMB (cardiac)     Status: Abnormal   Collection Time: 01/26/15 11:35 PM  Result Value Ref Range   Total CK 137 49 - 397 U/L   CK, MB 5.2 (H) 0.5 - 5.0 ng/mL   Relative Index 3.8 (H) 0.0 - 2.5  Troponin I (q 6hr x 3)     Status: Abnormal   Collection Time: 01/27/15  3:00 AM  Result Value Ref Range   Troponin I 0.06 (H) <0.031 ng/mL    Comment:        PERSISTENTLY INCREASED TROPONIN VALUES IN THE RANGE OF 0.04-0.49 ng/mL CAN BE SEEN IN:       -UNSTABLE ANGINA       -CONGESTIVE HEART FAILURE       -MYOCARDITIS       -CHEST TRAUMA       -ARRYHTHMIAS       -LATE PRESENTING MYOCARDIAL INFARCTION       -COPD   CLINICAL FOLLOW-UP RECOMMENDED.   CBC     Status: Abnormal   Collection Time: 01/27/15  3:00 AM  Result Value Ref Range   WBC 10.4 4.0 - 10.5 K/uL   RBC 2.40 (L) 4.22 - 5.81 MIL/uL   Hemoglobin 7.3 (L) 13.0 - 17.0 g/dL   HCT 21.6 (L) 39.0 - 52.0 %   MCV 90.0 78.0 - 100.0 fL   MCH 30.4 26.0 - 34.0 pg   MCHC 33.8 30.0 - 36.0 g/dL   RDW 15.1 11.5 - 15.5 %   Platelets 352 150 - 400 K/uL  Comprehensive metabolic panel     Status: Abnormal   Collection Time: 01/27/15  3:00 AM  Result Value Ref Range   Sodium 136 135 - 145 mmol/L   Potassium 4.1 3.5 - 5.1 mmol/L   Chloride 108 101 - 111 mmol/L   CO2 19 (L) 22 - 32 mmol/L   Glucose, Bld 119 (H) 65 - 99 mg/dL   BUN 67 (H) 6 - 20 mg/dL   Creatinine, Ser 6.31 (H) 0.61 - 1.24 mg/dL   Calcium 8.5 (L) 8.9 - 10.3 mg/dL   Total Protein 6.3 (L) 6.5 - 8.1 g/dL   Albumin 2.2 (L) 3.5 - 5.0 g/dL   AST 25 15 - 41 U/L   ALT 40 17 - 63 U/L   Alkaline Phosphatase  188 (H) 38 - 126 U/L   Total Bilirubin 1.1 0.3 - 1.2 mg/dL   GFR calc non Af Amer 9 (L) >60 mL/min   GFR  calc Af Amer 11 (L) >60 mL/min    Comment: (NOTE) The eGFR has been calculated using the CKD EPI equation. This calculation has not been validated in all clinical situations. eGFR's persistently <60 mL/min signify possible Chronic Kidney Disease.    Anion gap 9 5 - 15  TSH     Status: None   Collection Time: 01/27/15  3:00 AM  Result Value Ref Range   TSH 0.748 0.350 - 4.500 uIU/mL  Iron and TIBC     Status: Abnormal   Collection Time: 01/27/15  3:00 AM  Result Value Ref Range   Iron 38 (L) 45 - 182 ug/dL   TIBC 185 (L) 250 - 450 ug/dL   Saturation Ratios 21 17.9 - 39.5 %   UIBC 147 ug/dL  Ferritin     Status: None   Collection Time: 01/27/15  3:00 AM  Result Value Ref Range   Ferritin 316 24 - 336 ng/mL  Vitamin B12     Status: None   Collection Time: 01/27/15  3:00 AM  Result Value Ref Range   Vitamin B-12 559 180 - 914 pg/mL    Comment: (NOTE) This assay is not validated for testing neonatal or myeloproliferative syndrome specimens for Vitamin B12 levels.   Sedimentation rate     Status: Abnormal   Collection Time: 01/27/15  3:00 AM  Result Value Ref Range   Sed Rate 103 (H) 0 - 16 mm/hr  Glucose, capillary     Status: Abnormal   Collection Time: 01/27/15  7:35 AM  Result Value Ref Range   Glucose-Capillary 117 (H) 65 - 99 mg/dL  Troponin I (q 6hr x 3)     Status: Abnormal   Collection Time: 01/27/15 10:11 AM  Result Value Ref Range   Troponin I 0.05 (H) <0.031 ng/mL    Comment:        PERSISTENTLY INCREASED TROPONIN VALUES IN THE RANGE OF 0.04-0.49 ng/mL CAN BE SEEN IN:       -UNSTABLE ANGINA       -CONGESTIVE HEART FAILURE       -MYOCARDITIS       -CHEST TRAUMA       -ARRYHTHMIAS       -LATE PRESENTING MYOCARDIAL INFARCTION       -COPD   CLINICAL FOLLOW-UP RECOMMENDED.   Glucose, capillary     Status: Abnormal   Collection Time: 01/27/15 12:18 PM  Result Value Ref Range   Glucose-Capillary 126 (H) 65 - 99 mg/dL   Comment 1 Notify RN   Hepatitis B  surface antigen     Status: None   Collection Time: 01/27/15  4:00 PM  Result Value Ref Range   Hepatitis B Surface Ag NEGATIVE NEGATIVE    Comment: Performed at [facility]  Hepatitis B surface antibody     Status: None   Collection Time: 01/27/15  4:00 PM  Result Value Ref Range   Hep B S Ab NEGATIVE NEGATIVE    Comment: Performed at [facility]  Glucose, capillary     Status: Abnormal   Collection Time: 01/27/15  6:24 PM  Result Value Ref Range   Glucose-Capillary 146 (H) 65 - 99 mg/dL  Glucose, capillary     Status: Abnormal   Collection Time: 01/27/15 10:43 PM  Result Value Ref  Range   Glucose-Capillary 143 (H) 65 - 99 mg/dL  Glucose, capillary     Status: Abnormal   Collection Time: 01/28/15  8:24 AM  Result Value Ref Range   Glucose-Capillary 119 (H) 65 - 99 mg/dL    Vitals: Blood pressure 128/67, pulse 73, temperature 97.4 F (36.3 C), temperature source Oral, resp. rate 17, height 5' 11"  (1.803 m), weight 84.6 kg (186 lb 8.2 oz), SpO2 97 %.  Risk to Self: Is patient at risk for suicide?: Yes Risk to Others:   Prior Inpatient Therapy:   Prior Outpatient Therapy:    Current Facility-Administered Medications  Medication Dose Route Frequency Provider Last Rate Last Dose  . acetaminophen-codeine (TYLENOL #3) 300-30 MG per tablet 1-2 tablet  1-2 tablet Oral Q6H PRN Jani Gravel, MD      . aspirin tablet 325 mg  325 mg Oral Daily Rhetta Mura Schorr, NP   325 mg at 01/28/15 0939  . atorvastatin (LIPITOR) tablet 80 mg  80 mg Oral q1800 Jani Gravel, MD   80 mg at 01/27/15 1833  . calcitRIOL (ROCALTROL) capsule 0.25 mcg  0.25 mcg Oral Daily Jani Gravel, MD   0.25 mcg at 01/28/15 0939  . calcium acetate (PHOSLO) capsule 1,334 mg  1,334 mg Oral TID WC Jani Gravel, MD   1,334 mg at 01/28/15 4801  . carvedilol (COREG) tablet 25 mg  25 mg Oral BID WC Jani Gravel, MD   25 mg at 01/27/15 1833  . Darbepoetin Alfa (ARANESP) injection 150 mcg  150 mcg Intravenous Q Fri-HD [name], MD      . ferric gluconate (NULECIT) 125 mg in sodium chloride 0.9 % 100 mL IVPB  125 mg Intravenous Daily [name], MD   125 mg at 01/27/15 1716  . heparin injection 5,000 Units  5,000 Units Subcutaneous 3 times per day Jani Gravel, MD   5,000 Units at 01/28/15 0520  . insulin aspart (novoLOG) injection 0-5 Units  0-5 Units Subcutaneous QHS Jani Gravel, MD   0 Units at 01/26/15 2200  . insulin aspart (novoLOG) injection 0-9 Units  0-9 Units Subcutaneous TID WC Jani Gravel, MD   1 Units at 01/27/15 1833  . montelukast (SINGULAIR) tablet 10 mg  10 mg Oral QHS Jani Gravel, MD   10 mg at 01/27/15 2350  . pantoprazole (PROTONIX) 80 mg in sodium chloride 0.9 % 250 mL (0.32 mg/mL) infusion  8 mg/hr Intravenous Continuous Jani Gravel, MD 25 mL/hr at 01/27/15 1907 8 mg/hr at 01/27/15 1907  . [START ON 01/30/2015] pantoprazole (PROTONIX) injection 40 mg  40 mg Intravenous Q12H Jani Gravel, MD        Musculoskeletal: Strength & Muscle Tone: decreased Gait & Station: unable to stand Patient leans: N/A  Psychiatric Specialty Exam: Physical Exam as per history and physical   ROS No Fever-chills, No Headache, No changes with Vision or hearing, reports vertigo No problems swallowing food or Liquids, No Chest pain, Cough or Shortness of Breath, No Abdominal pain, No Nausea or Vommitting, Bowel movements are regular, No Blood in stool or Urine, No dysuria, No new skin rashes or bruises, No new joints pains-aches,  No new weakness, tingling, numbness in any extremity, No recent weight gain or loss, No polyuria, polydypsia or polyphagia,  A full 10 point Review of Systems was done, except as stated above, all other Review of Systems were negative.  Blood pressure 128/67, pulse 73, temperature 97.4 F (36.3 C), temperature source Oral, resp. rate 17, height  5' 11"  (1.803 m), weight 84.6 kg (186 lb 8.2 oz), SpO2 97 %.Body mass index is 26.02 kg/(m^2).  General Appearance: Casual  Eye  Contact::  Good  Speech:  Clear and Coherent  Volume:  Normal  Mood:  Depressed and Worthless  Affect:  Appropriate and Congruent  Thought Process:  Coherent and Goal Directed  Orientation:  Full (Time, Place, and Person)  Thought Content:  WDL  Suicidal Thoughts:  No  Homicidal Thoughts:  No  Memory:  Immediate;   Good Recent;   Good  Judgement:  Intact  Insight:  Fair  Psychomotor Activity:  Decreased  Concentration:  Good  Recall:  Good  Fund of Knowledge:Good  Language: Good  Akathisia:  Negative  Handed:  Right  AIMS (if indicated):     Assets:  Communication Skills Desire for Improvement Housing Intimacy Leisure Time Resilience Social Support  ADL's:  Impaired  Cognition: WNL  Sleep:      Medical Decision Making: New problem, with additional work up planned, Review of Psycho-Social Stressors (1), Review or order clinical lab tests (1), Established Problem, Worsening (2), Review of Last Therapy Session (1), Review of Medication Regimen & Side Effects (2) and Review of New Medication or Change in Dosage (2)  Treatment Plan Summary: Patient presented with the symptoms of sadness, disturbance of sleep and appetite, low self-esteem since he was not able to provide for his family and also has self-medicating with illicit drugs cocaine which causing more depression, irritability and possible suicidal thoughts. Patient denies suicidal thoughts when he becomes sober. 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  Cocaine intoxication: Counseling to stay sober   Substance-induced mood disorder: May benefit from fluoxetine 20 mg daily  Patient does not meet criteria for psychiatric inpatient admission. Supportive therapy provided about  ongoing stressors.   Patient will be referred to the psychiatric social service regarding psychosocial history and contacting the family members for collateral Information.  Disposition: patient will be referred to the outpatient psychiatric medication management and counseling services for substance abuse, depression and anxiety when medically stable.   Bernard Slayden,JANARDHAHA R. 01/28/2015 9:48 AM

## 2015-01-28 NOTE — Evaluation (Signed)
Physical Therapy Evaluation/ Discharge Patient Details Name: [name] MRN: [id number] DOB: 1961/08/11 Today's Date: 01/28/2015   History of Present Illness  54 yo male with hx of CHF (ef=55%), hypertension, dm2, cocaine abuse, left toe amputation, apparently c/o sob and swelling of the legs for the past couple of days  Clinical Impression  Pt pleasant and states he has had a few falls in the last couple of months and difficulty with leg cramping. Pt with low fall risk on BErg assessment but limited dorsiflexion. Pt educated for fall reduction exercises of ankle stretching, hip and ankle strengthening, strategies for stepping into tub, removal of rugs, lighting at night and use of cane for uneven surfaces. Pt at baseline functional level and verbalizes understanding of all education. Pt without further therapy need at this time as he is able to verbalize and demonstrate education. Will sign off with pt aware and in agreement and recommend daily ambulation with nursing supervision.     Follow Up Recommendations No PT follow up    Equipment Recommendations  Cane    Recommendations for Other Services       Precautions / Restrictions Precautions Precautions: Fall      Mobility  Bed Mobility Overal bed mobility: Independent                Transfers Overall transfer level: Independent                  Ambulation/Gait Ambulation/Gait assistance: Modified independent (Device/Increase time) Ambulation Distance (Feet): 450 Feet Assistive device: None Gait Pattern/deviations: WFL(Within Functional Limits)   Gait velocity interpretation: at or above normal speed for age/gender General Gait Details: Pt with steady gait able to complete head turns in hallway   Stairs            Wheelchair Mobility    Modified Rankin (Stroke Patients Only)       Balance                                 Standardized Balance Assessment Standardized  Balance Assessment : Berg Balance Test Berg Balance Test Sit to Stand: Able to stand without using hands and stabilize independently Standing Unsupported: Able to stand safely 2 minutes Sitting with Back Unsupported but Feet Supported on Floor or Stool: Able to sit safely and securely 2 minutes Stand to Sit: Sits safely with minimal use of hands Transfers: Able to transfer safely, minor use of hands Standing Unsupported with Eyes Closed: Able to stand 10 seconds safely Standing Ubsupported with Feet Together: Able to place feet together independently and stand 1 minute safely From Standing, Reach Forward with Outstretched Arm: Can reach confidently >25 cm (10") From Standing Position, Pick up Object from Floor: Able to pick up shoe safely and easily From Standing Position, Turn to Look Behind Over each Shoulder: Looks behind from both sides and weight shifts well Turn 360 Degrees: Able to turn 360 degrees safely in 4 seconds or less Standing Unsupported, Alternately Place Feet on Step/Stool: Able to stand independently and complete 8 steps >20 seconds Standing Unsupported, One Foot in Front: Able to plae foot ahead of the other independently and hold 30 seconds Standing on One Leg: Able to lift leg independently and hold equal to or more than 3 seconds Total Score: 52         Pertinent Vitals/Pain Pain Assessment: No/denies pain  HR 73 sats 100% on RA  Home Living Family/patient expects to be discharged to:: Private residence Living Arrangements: Spouse/significant other Available Help at Discharge: Family;Available 24 hours/day Type of Home: Apartment Home Access: Level entry     Home Layout: One level Home Equipment: None      Prior Function Level of Independence: Independent         Comments: 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     Journalist, newspaper        Extremity/Trunk Assessment   Upper Extremity Assessment: Overall WFL for tasks assessed            Lower Extremity Assessment: Overall WFL for tasks assessed      Cervical / Trunk Assessment: Normal  Communication   Communication: No difficulties  Cognition Arousal/Alertness: Awake/alert Behavior During Therapy: WFL for tasks assessed/performed Overall Cognitive Status: Within Functional Limits for tasks assessed                      General Comments      Exercises General Exercises - Lower Extremity Hip Flexion/Marching: AROM;Seated;Both;15 reps Toe Raises: AROM;Seated;Both;15 reps Other Exercises Other Exercises: pt performed PROM dorsiflexion stretch bil LE x 3 in seated      Assessment/Plan    PT Assessment Patent does not need any further PT services  PT Diagnosis Abnormality of gait   PT Problem List    PT Treatment Interventions     PT Goals (Current goals can be found in the Care Plan section) Acute Rehab PT Goals PT Goal Formulation: All assessment and education complete, DC therapy    Frequency     Barriers to discharge        Co-evaluation               End of Session Equipment Utilized During Treatment: Gait belt Activity Tolerance: Patient tolerated treatment well Patient left: in chair;with call bell/phone within reach Nurse Communication: Mobility status         Time: 9150-4136 PT Time Calculation (min) (ACUTE ONLY): 24 min   Charges:   PT Evaluation $Initial PT Evaluation Tier I: 1 Procedure PT Treatments $Therapeutic Activity: 8-22 mins   PT G CodesMelford Aase 01/28/2015, 2:19 PM Elwyn Reach, [place]

## 2015-01-29 ENCOUNTER — Other Ambulatory Visit (HOSPITAL_COMMUNITY): Payer: Self-pay

## 2015-01-29 ENCOUNTER — Inpatient Hospital Stay (HOSPITAL_COMMUNITY): Payer: Medicaid Other

## 2015-01-29 DIAGNOSIS — I151 Hypertension secondary to other renal disorders: Secondary | ICD-10-CM

## 2015-01-29 DIAGNOSIS — N2889 Other specified disorders of kidney and ureter: Secondary | ICD-10-CM

## 2015-01-29 DIAGNOSIS — I509 Heart failure, unspecified: Secondary | ICD-10-CM

## 2015-01-29 LAB — CBC
HEMATOCRIT: 24.2 % — AB (ref 39.0–52.0)
Hemoglobin: 7.8 g/dL — ABNORMAL LOW (ref 13.0–17.0)
MCH: 30 pg (ref 26.0–34.0)
MCHC: 32.2 g/dL (ref 30.0–36.0)
MCV: 93.1 fL (ref 78.0–100.0)
Platelets: 355 10*3/uL (ref 150–400)
RBC: 2.6 MIL/uL — AB (ref 4.22–5.81)
RDW: 15.6 % — AB (ref 11.5–15.5)
WBC: 9.5 10*3/uL (ref 4.0–10.5)

## 2015-01-29 LAB — GLUCOSE, CAPILLARY
GLUCOSE-CAPILLARY: 146 mg/dL — AB (ref 65–99)
Glucose-Capillary: 100 mg/dL — ABNORMAL HIGH (ref 65–99)
Glucose-Capillary: 104 mg/dL — ABNORMAL HIGH (ref 65–99)

## 2015-01-29 MED ORDER — PENTAFLUOROPROP-TETRAFLUOROETH EX AERO: 1.0000 "application " | INHALATION_SPRAY | CUTANEOUS | Status: DC | PRN

## 2015-01-29 MED ORDER — LIDOCAINE HCL (PF) 1 % IJ SOLN: 5.0000 mL | INTRAMUSCULAR | Status: DC | PRN

## 2015-01-29 MED ORDER — FAMOTIDINE 20 MG PO TABS
20.0000 mg | ORAL_TABLET | Freq: Every day | ORAL | Status: DC
  Administered 2015-01-30 – 2015-02-03 (×5): 20 mg via ORAL
  Filled 2015-01-29 (×5): qty 1

## 2015-01-29 MED ORDER — HEPARIN SODIUM (PORCINE) 1000 UNIT/ML DIALYSIS: 1000.0000 [IU] | INTRAMUSCULAR | Status: DC | PRN

## 2015-01-29 MED ORDER — SODIUM CHLORIDE 0.9 % IV SOLN: 100.0000 mL | INTRAVENOUS | Status: DC | PRN

## 2015-01-29 MED ORDER — ALTEPLASE 2 MG IJ SOLR
2.0000 mg | Freq: Once | INTRAMUSCULAR | Status: DC | PRN
  Filled 2015-01-29: qty 2

## 2015-01-29 MED ORDER — NEPRO/CARBSTEADY PO LIQD: 237.0000 mL | ORAL | Status: DC | PRN

## 2015-01-29 MED ORDER — LIDOCAINE-PRILOCAINE 2.5-2.5 % EX CREA
1.0000 "application " | TOPICAL_CREAM | CUTANEOUS | Status: DC | PRN
  Filled 2015-01-29: qty 5

## 2015-01-29 NOTE — Procedures (Signed)
I have seen and examined this patient and agree with the plan of care. Patient dialysis dependent with no issues on HD [facility] W 01/29/2015, 8:12 AM

## 2015-01-29 NOTE — Progress Notes (Addendum)
Progress Note  Dayle Mcnerney HAL:[id number] DOB: [birth year] DOA: 01/26/2015 PCP: Minerva Ends, MD  Admit HPI / Brief Narrative: 54 yo M Hx substance abuse, Diastolic CHF, hypertension, DM2, and cocaine abuse who presented with SOB and swelling of the legs for a couple of days. Pt noted sscp, " sharp" radiating to the neck. Pt states that pain was constant and associate with n/v. Pt noted some blood w/ emesis after multiple episodes of n/v.In the ED a CXR was negative and an ekg noted nsr, no st -t changes c/w acute ischemia.  HPI/Subjective: Patient complaining about slight dizziness this morning, denies any palpitations. Denies any chest pain, shortness of breath. No other complaints.  Assessment/Plan:   ESRD, HD initiated during this hospital stay Presented with creatinine of 6.78, not that far from his baseline. According to nephrology patient renal function deteriorated recently. Patient started on hemodialysis, started on the CLIP protocol to find a spot for dialysis as outpatient.  Substance abuse Positive for cocaine and opiates - the patient has been advised to abstain from all substances of abuse  Suicidal ideation Patient evaluated by psychiatry, currently no homicidal or suicidal ideation. Recommended outpatient follow-up for substance abuse.  Positive SPEP/UPEP Positive SPEP with IgA monoclonal gammopathy. Spoke with Dr. Lindi Adie, recommended bone survey, beta-2 microglobulin and kappa/lambda chains.  Acute diastolic CHF Previously patient admitted with pulmonary edema, LVEF is 55-60%. I'm not 100% sure that patient has diastolic CHF, this is likely noncardiogenic pulmonary edema secondary to CKD. Chest x-ray is clear now.  Elevated troponin -Most likely secondary to cocaine use in the setting of ESRD - chest pain was not anginal in character and has spontaneously resolved - patient advised to abstain completely and absolutely from  cocaine  DM2 CBG currently reasonably controlled, A1c is 5.8 from March 2016  Anemia Anemia panel suggestive of anemia related to chronic kidney disease - folic acid still pending - fecal occult blood negative   Code Status: FULL Family Communication: no family present at time of exam Disposition Plan: Await results of hematology consultation - ongoing care of ESRD per nephrology  Consultants: Nephrology Psych   Procedures: TTE - pending   Antibiotics: none  DVT prophylaxis: SCD  Objective: Blood pressure 122/66, pulse 73, temperature 98.5 F (36.9 C), temperature source Oral, resp. rate 20, height 5\' 11"  (1.803 m), weight 82.645 kg (182 lb 3.2 oz), SpO2 98 %.  Intake/Output Summary (Last 24 hours) at 01/29/15 1212 Last data filed at 01/29/15 0944  Gross per 24 hour  Intake    240 ml  Output   3075 ml  Net  -2835 ml   Exam: General: A/O 4, NAD, No acute respiratory distress Eyes: Negative headache, eye pain, double vision, scotomas, floaters, negative retinal hemorrhage ENT: Negative Runny nose, negative ear pain, negative tinnitus, negative gingival bleeding,  Neck:  Negative scars, masses, torticollis, lymphadenopathy, JVD Lungs: Clear to auscultation bilaterally without wheezes or crackles Cardiovascular: Regular rate and rhythm without murmur gallop or rub normal S1 and S2 Abdomen:negative abdominal pain, negative dysphagia, Nontender, nondistended, soft, bowel sounds positive, no rebound, no ascites, no appreciable mass Extremities: No significant cyanosis, clubbing. Bilateral lower extremity edemaLt >> Rt 2+; AV fistula with good thrill on the right antecubital. Psychiatric:  Negative depression, negative anxiety, negative fatigue, negative mania currently very calm, states no desire to commit suicide/homicide. Neurologic:  Cranial nerves II through XII intact, tongue/uvula midline, all extremities muscle strength 5/5, sensation intact throughout, , negative  dysarthria, negative expressive  aphasia, negative receptive aphasia.   Data Reviewed: Basic Metabolic Panel:  Recent Labs Lab 01/26/15 1615 01/27/15 0300 01/28/15 1430  NA 134* 136 133*  K 4.4 4.1 4.5  CL 107 108 103  CO2 16* 19* 20*  GLUCOSE 156* 119* 189*  BUN 63* 67* 49*  CREATININE 5.96* 6.31* 5.55*  CALCIUM 8.8* 8.5* 8.4*  PHOS  --   --  5.0*   Liver Function Tests:  Recent Labs Lab 01/26/15 1615 01/27/15 0300 01/28/15 1430  AST 40 25  --   ALT 51 40  --   ALKPHOS 225* 188*  --   BILITOT 1.6* 1.1  --   PROT 6.9 6.3*  --   ALBUMIN 2.5* 2.2* 2.3*   CBC:  Recent Labs Lab 01/26/15 1605 01/27/15 0300 01/28/15 1430  WBC 11.8* 10.4 9.5  HGB 8.3* 7.3* 8.0*  HCT 24.7* 21.6*  22.2* 24.7*  MCV 91.8 90.0 91.8  PLT 397 352 409*   Cardiac Enzymes:  Recent Labs Lab 01/26/15 2335 01/27/15 0300 01/27/15 1011  CKTOTAL 137  --   --   CKMB 5.2*  --   --   TROPONINI 0.07* 0.06* 0.05*   BNP (last 3 results)  Recent Labs  09/14/14 1228 11/29/14 1025 01/26/15 1615  BNP 174.3* 1981.6* 2258.1*    ProBNP (last 3 results)  Recent Labs  08/04/14 1007  PROBNP 52226.0*    CBG:  Recent Labs Lab 01/28/15 0824 01/28/15 1142 01/28/15 2108 01/29/15 0731 01/29/15 1144  GLUCAP 119* 141* 148* 100* 146*    Recent Results (from the past 240 hour(s))  MRSA PCR Screening     Status: None   Collection Time: 01/26/15  9:01 PM  Result Value Ref Range Status   MRSA by PCR NEGATIVE NEGATIVE Final    Comment:        The GeneXpert MRSA Assay (FDA approved for NASAL specimens only), is one component of a comprehensive MRSA colonization surveillance program. It is not intended to diagnose MRSA infection nor to guide or monitor treatment for MRSA infections.      Studies:  Recent x-ray studies have been reviewed in detail by the Attending Physician  Scheduled Meds:  Scheduled Meds: . aspirin  325 mg Oral Daily  . atorvastatin  80 mg Oral q1800  .  calcitRIOL  0.25 mcg Oral Daily  . calcium acetate  1,334 mg Oral TID WC  . carvedilol  25 mg Oral BID WC  . darbepoetin (ARANESP) injection - DIALYSIS  150 mcg Intravenous Q Fri-HD  . famotidine  40 mg Oral BID  . ferric gluconate (FERRLECIT/NULECIT) IV  125 mg Intravenous Daily  . FLUoxetine  10 mg Oral Daily  . heparin  5,000 Units Subcutaneous 3 times per day  . insulin aspart  0-5 Units Subcutaneous QHS  . insulin aspart  0-9 Units Subcutaneous TID WC  . montelukast  10 mg Oral QHS    Time spent on care of this patient: 35 mins  Jadalee Westcott A, MD [facility] For Consults/Admissions - Flow Manager - [phone] Office  [phone]  Contact MD directly via text page:      [email or web address]      password [facility]  01/29/2015, 12:12 PM   LOS: 3 days

## 2015-01-29 NOTE — Progress Notes (Signed)
OT Cancellation Note  Patient Details Name: [name] MRN: [id number] DOB: 03-23-1961   Cancelled Treatment:    Reason Eval/Treat Not Completed: Patient at procedure or test/ unavailable . Pt in hemodialysis.  Almon Register [phone] 01/29/2015, 3:15 PM

## 2015-01-30 ENCOUNTER — Encounter (HOSPITAL_COMMUNITY): Payer: Self-pay | Admitting: Radiology

## 2015-01-30 DIAGNOSIS — F1410 Cocaine abuse, uncomplicated: Secondary | ICD-10-CM

## 2015-01-30 DIAGNOSIS — R110 Nausea: Secondary | ICD-10-CM

## 2015-01-30 DIAGNOSIS — D472 Monoclonal gammopathy: Secondary | ICD-10-CM

## 2015-01-30 DIAGNOSIS — F1494 Cocaine use, unspecified with cocaine-induced mood disorder: Secondary | ICD-10-CM

## 2015-01-30 DIAGNOSIS — K920 Hematemesis: Secondary | ICD-10-CM

## 2015-01-30 LAB — CBC
HCT: 24.8 % — ABNORMAL LOW (ref 39.0–52.0)
Hemoglobin: 7.8 g/dL — ABNORMAL LOW (ref 13.0–17.0)
MCH: 30 pg (ref 26.0–34.0)
MCHC: 31.5 g/dL (ref 30.0–36.0)
MCV: 95.4 fL (ref 78.0–100.0)
PLATELETS: 361 10*3/uL (ref 150–400)
RBC: 2.6 MIL/uL — AB (ref 4.22–5.81)
RDW: 15.9 % — AB (ref 11.5–15.5)
WBC: 11.4 10*3/uL — ABNORMAL HIGH (ref 4.0–10.5)

## 2015-01-30 LAB — BASIC METABOLIC PANEL
Anion gap: 8 (ref 5–15)
BUN: 22 mg/dL — ABNORMAL HIGH (ref 6–20)
CALCIUM: 8.1 mg/dL — AB (ref 8.9–10.3)
CO2: 27 mmol/L (ref 22–32)
CREATININE: 3.87 mg/dL — AB (ref 0.61–1.24)
Chloride: 100 mmol/L — ABNORMAL LOW (ref 101–111)
GFR, EST AFRICAN AMERICAN: 19 mL/min — AB (ref >60)
GFR, EST NON AFRICAN AMERICAN: 16 mL/min — AB (ref >60)
GLUCOSE: 115 mg/dL — AB (ref 65–99)
Potassium: 4.1 mmol/L (ref 3.5–5.1)
SODIUM: 135 mmol/L (ref 135–145)

## 2015-01-30 LAB — GLUCOSE, CAPILLARY
GLUCOSE-CAPILLARY: 153 mg/dL — AB (ref 65–99)
Glucose-Capillary: 95 mg/dL (ref 65–99)
Glucose-Capillary: 151 mg/dL — ABNORMAL HIGH (ref 65–99)
Glucose-Capillary: 248 mg/dL — ABNORMAL HIGH (ref 65–99)

## 2015-01-30 NOTE — Consult Note (Signed)
[place] NOTE  Patient Care Team: [name], MD as PCP - General (Family Medicine) Renella Cunas, MD as PCP - Cardiology (Cardiology) Fleet Contras, MD as Consulting Physician (Nephrology)  CHIEF COMPLAINTS/PURPOSE OF CONSULTATION:  Elevated monoclonal protein  HISTORY OF PRESENTING ILLNESS:  [name] 54 y.o. male is here because of renal failure requiring hemodialysis. Patient had long-standing renal dysfunction attributed to diabetes and had prior workups showing elevated monoclonal protein. In December 2015, patient had elevated IgA 1270 and immunofixation revealed a monoclonal IgA Kappa protein. At that time serial 3 Over 60 and lambda was 6 with a ratio of 9.61. Serum protein electrophoresis revealed an M spike of 0.45 g. We are consulted to do an evaluation of the significance of this monoclonal protein I reviewed his records extensively and collaborated the history with the patient.  MEDICAL HISTORY:  Past Medical History  Diagnosis Date  . PVD (peripheral vascular disease)   . Shortness of breath dyspnea   . Constipation   . Anemia     unsure  . CHF (congestive heart failure) 2015  . Diastolic congestive heart failure   . Diabetes mellitus 2015    type 2  . GERD (gastroesophageal reflux disease)     unsure  . Hypertension 2015  . Chronic renal disease, stage IV 2015  . Cocaine abuse   . Cellulitis and abscess of foot 08/24/2011  . Protein calorie malnutrition     SURGICAL HISTORY: Past Surgical History  Procedure Laterality Date  . Leg surgery    . Knee surgery Right   . Amputation  09/06/2011    Procedure: AMPUTATION RAY;  Surgeon: Wylene Simmer, MD;  Location: [place];  Service: Orthopedics;  Laterality: Left;  Left Hallux Amputation  . Av fistula placement Left 08/12/2014    Procedure: CREATION OF A BRACHIOCEPHALIC ARTERIOVENOUS (AV) FISTULA  LEFT ARM;  Surgeon: Mal Misty, MD;  Location: [place];  Service: Vascular;   Laterality: Left;  . Av fistula placement Right 10/07/2014    Procedure: Creation Right Arm Arteriovenous fistula;  Surgeon: Mal Misty, MD;  [facility];  Service: Vascular;  Laterality: Right;  . Appendectomy      SOCIAL HISTORY: History   Social History  . Marital Status: Legally Separated    Spouse Name: N/A  . Number of Children: N/A  . Years of Education: N/A   Occupational History  . Not on file.   Social History Main Topics  . Smoking status: Never Smoker   . Smokeless tobacco: Never Used  . Alcohol Use: No  . Drug Use: No     Comment: Pt reports last use of cocaine was "last month"   As of 10/06/14  last was 2 months ago  . Sexual Activity: Not on file   Other Topics Concern  . Not on file   Social History Narrative    FAMILY HISTORY: Family History  Problem Relation Age of Onset  . Adopted: Yes  . Varicose Veins Mother   . Varicose Veins Sister     ALLERGIES:  has No Known Allergies.  MEDICATIONS:  Current Facility-Administered Medications  Medication Dose Route Frequency Provider Last Rate Last Dose  . acetaminophen-codeine (TYLENOL #3) 300-30 MG per tablet 1-2 tablet  1-2 tablet Oral Q6H PRN Jani Gravel, MD   2 tablet at 01/30/15 0749  . aspirin tablet 325 mg  325 mg Oral Daily Jeryl Columbia, NP   Stopped at 01/29/15 0818  .  atorvastatin (LIPITOR) tablet 80 mg  80 mg Oral q1800 Jani Gravel, MD   80 mg at 01/29/15 1842  . calcitRIOL (ROCALTROL) capsule 0.25 mcg  0.25 mcg Oral Daily Jani Gravel, MD   Stopped at 01/29/15 [phone]  . calcium acetate (PHOSLO) capsule 1,334 mg  1,334 mg Oral TID WC Jani Gravel, MD   1,334 mg at 01/30/15 0749  . carvedilol (COREG) tablet 25 mg  25 mg Oral BID WC Jani Gravel, MD   25 mg at 01/30/15 0749  . Darbepoetin Alfa (ARANESP) injection 150 mcg  150 mcg Intravenous Q Fri-HD [name], MD   150 mcg at 01/28/15 1456  . famotidine (PEPCID) tablet 20 mg  20 mg Oral Daily Verlee Monte, MD      . ferric gluconate  (NULECIT) 125 mg in sodium chloride 0.9 % 100 mL IVPB  125 mg Intravenous Daily [name], MD   Stopped at 01/29/15 [phone]  . FLUoxetine (PROZAC) capsule 10 mg  10 mg Oral Daily Ambrose Finland, MD   Stopped at 01/29/15 0820  . heparin injection 5,000 Units  5,000 Units Subcutaneous 3 times per day Jani Gravel, MD   5,000 Units at 01/30/15 0524  . insulin aspart (novoLOG) injection 0-5 Units  0-5 Units Subcutaneous QHS Jani Gravel, MD   0 Units at 01/26/15 2200  . insulin aspart (novoLOG) injection 0-9 Units  0-9 Units Subcutaneous TID WC Jani Gravel, MD   Stopped at 01/29/15 [phone]  . montelukast (SINGULAIR) tablet 10 mg  10 mg Oral QHS Jani Gravel, MD   10 mg at 01/29/15 2122    REVIEW OF SYSTEMS:   Constitutional: Denies fevers, chills or abnormal night sweats Eyes: Denies blurriness of vision, double vision or watery eyes Ears, nose, mouth, throat, and face: Denies mucositis or sore throat Respiratory: Denies cough, dyspnea or wheezes Cardiovascular: Denies palpitation, chest discomfort or lower extremity swelling Gastrointestinal:  Denies nausea, heartburn or change in bowel habits Skin: Denies abnormal skin rashes Lymphatics: Denies new lymphadenopathy or easy bruising Neurological:Denies numbness, tingling or new weaknesses Behavioral/Psych: Mood is stable, no new changes  All other systems were reviewed with the patient and are negative.  PHYSICAL EXAMINATION: ECOG PERFORMANCE STATUS: 2 - Symptomatic, <50% confined to bed  Filed Vitals:   01/30/15 0525  BP: 128/66  Pulse: 77  Temp: 98.1 F (36.7 C)  Resp: 16   Filed Weights   01/29/15 1440 01/29/15 1800 01/29/15 2047  Weight: 184 lb 8.4 oz (83.7 kg) 180 lb 12.4 oz (82 kg) 186 lb 4.6 oz (84.5 kg)    GENERAL:alert, no distress and comfortable SKIN: skin color, texture, turgor are normal, no rashes or significant lesions EYES: normal, conjunctiva are pink and non-injected, sclera clear OROPHARYNX:no exudate, no  erythema and lips, buccal mucosa, and tongue normal  NECK: supple, thyroid normal size, non-tender, without nodularity LYMPH:  no palpable lymphadenopathy in the cervical, axillary or inguinal LUNGS: clear to auscultation and percussion with normal breathing effort HEART: regular rate & rhythm and no murmurs and no lower extremity edema, fistula right arm ABDOMEN:abdomen soft, non-tender and normal bowel sounds Musculoskeletal:no cyanosis of digits and no clubbing  PSYCH: alert & oriented x 3 with fluent speech NEURO: no focal motor/sensory deficits  LABORATORY DATA:  I have reviewed the data as listed Lab Results  Component Value Date   WBC 11.4* 01/30/2015   HGB 7.8* 01/30/2015   HCT 24.8* 01/30/2015   MCV 95.4 01/30/2015   PLT 361 01/30/2015  Lab Results  Component Value Date   NA 133* 01/28/2015   K 4.5 01/28/2015   CL 103 01/28/2015   CO2 20* 01/28/2015    ASSESSMENT AND PLAN:  1. Elevated monoclonal protein: His last blood work was in December 2015 which showed a 0.45 g of M protein which was IgA. I discussed with him that it could be a monoclonal gammopathy of unknown significance. Bone survey done yesterday did not show any significant lytic lesions. There was one area of suspicion but it was not clinically significant. Given the fact that he has anemia and renal failure, it would be prudent to obtain a bone marrow biopsy for further evaluation.  2. Request for radiology to perform a bone marrow aspiration biopsy 3. Normocytic anemia due to renal dysfunction  I Would like to follow him 1 week after the bone marrow biopsy as an outpatient to discuss the results.   Rulon Eisenmenger, MD 9:14 AM

## 2015-01-30 NOTE — Plan of Care (Signed)
Problem: Consults Goal: Nutrition Consult-if indicated Outcome: Progressing Decreased albumin noted. Pt complaints of portion sizes too small. Dietary consult. Continue to monitor.

## 2015-01-30 NOTE — H&P (Signed)
Reason for Consult: Anemia, Renal failure, suspicion of MGUS/Myeloma   Chief Complaint: Chief Complaint  Patient presents with  . Shortness of Breath  . Hematemesis    Referring Physician(s): Hematology   History of Present Illness: [name] is a 54 y.o. male with anemia, ESRD now on HD, elevated monoclonal protein who has been seen by Oncology/Heme. IR received request for image guided bone marrow biopsy. The patient denies any bone pain, but does admit to intermittent LE cramping. He denies any chest pain, shortness of breath or palpitations. He denies any active signs of bleeding or excessive bruising. He denies any recent fever or chills. The patient denies any history of sleep apnea or chronic oxygen use. He has no known complications to sedation.    Past Medical History  Diagnosis Date  . PVD (peripheral vascular disease)   . Shortness of breath dyspnea   . Constipation   . Anemia     unsure  . CHF (congestive heart failure) 2015  . Diastolic congestive heart failure   . Diabetes mellitus 2015    type 2  . GERD (gastroesophageal reflux disease)     unsure  . Hypertension 2015  . Chronic renal disease, stage IV 2015  . Cocaine abuse   . Cellulitis and abscess of foot 08/24/2011  . Protein calorie malnutrition     Past Surgical History  Procedure Laterality Date  . Leg surgery    . Knee surgery Right   . Amputation  09/06/2011    Procedure: AMPUTATION RAY;  Surgeon: Toni Arthurs, MD;  Location: [facility] OR;  Service: Orthopedics;  Laterality: Left;  Left Hallux Amputation  . Av fistula placement Left 08/12/2014    Procedure: CREATION OF A BRACHIOCEPHALIC ARTERIOVENOUS (AV) FISTULA  LEFT ARM;  Surgeon: Pryor Ochoa, MD;  Location: [facility] OR;  Service: Vascular;  Laterality: Left;  . Av fistula placement Right 10/07/2014    Procedure: Creation Right Arm Arteriovenous fistula;  Surgeon: Pryor Ochoa, MD;  Location: [facility] OR;  Service: Vascular;  Laterality: Right;    . Appendectomy      Allergies: Review of patient's allergies indicates no known allergies.  Medications: Prior to Admission medications   Medication Sig Start Date End Date Taking? Authorizing Provider  acetaminophen-codeine (TYLENOL #3) 300-30 MG per tablet Take 1-2 tablets by mouth every 6 (six) hours as needed for moderate pain. 01/04/15  Yes Henrietta Hoover, NP  calcitRIOL (ROCALTROL) 0.25 MCG capsule Take 1 capsule (0.25 mcg total) by mouth daily. 11/05/14  Yes Dessa Phi, MD  calcium acetate (PHOSLO) 667 MG capsule Take 2 capsules (1,334 mg total) by mouth 3 (three) times daily with meals. 12/15/14  Yes Gaylord Shih, MD  carvedilol (COREG) 25 MG tablet Take 1 tablet (25 mg total) by mouth 2 (two) times daily with a meal. 09/15/14  Yes Gaylord Shih, MD  famotidine (PEPCID) 40 MG tablet Take 1 tablet (40 mg total) by mouth 2 (two) times daily. 12/01/14  Yes Leroy Sea, MD  furosemide (LASIX) 80 MG tablet Take 2 tablets (160 mg total) by mouth 3 (three) times daily. Per renal 12/28/14  Yes Dessa Phi, MD  hydrALAZINE (APRESOLINE) 100 MG tablet Take 1 tablet (100 mg total) by mouth 3 (three) times daily. 12/07/14  Yes Josalyn Funches, MD  montelukast (SINGULAIR) 10 MG tablet Take 1 tablet (10 mg total) by mouth at bedtime. 12/01/14  Yes Leroy Sea, MD  chlorpheniramine-HYDROcodone (TUSSIONEX) 10-8 MG/5ML [facility] Take  5 mLs by mouth every 12 (twelve) hours as needed for cough. Patient not taking: Reported on 12/15/2014 12/01/14   Thurnell Lose, MD  insulin aspart (NOVOLOG) 100 UNIT/ML injection Before each meal 3 times a day, 140-199 - 2 units, 200-250 - 4 units, 251-299 - 6 units,  300-349 - 8 units,  350 or above 10 units. Insulin PEN if approved, provide syringes and needles if needed. Can switch to any short-acting insulin. Patient not taking: Reported on 01/04/2015 12/01/14   Thurnell Lose, MD  Insulin Syringe-Needle U-100 25G X 1" 1 ML MISC Any brand, for 4 times a day  insulin SQ, 1 month supply. Patient not taking: Reported on 01/04/2015 12/01/14   Thurnell Lose, MD     Family History  Problem Relation Age of Onset  . Adopted: Yes  . Varicose Veins Mother   . Varicose Veins Sister     History   Social History  . Marital Status: Legally Separated    Spouse Name: N/A  . Number of Children: N/A  . Years of Education: N/A   Social History Main Topics  . Smoking status: Never Smoker   . Smokeless tobacco: Never Used  . Alcohol Use: No  . Drug Use: No     Comment: Pt reports last use of cocaine was "last month"   As of 10/06/14  last was 2 months ago  . Sexual Activity: Not on file   Other Topics Concern  . None   Social History Narrative   Review of Systems: A 12 point ROS discussed and pertinent positives are indicated in the HPI above.  All other systems are negative.  Review of Systems  Vital Signs: BP 130/75 mmHg  Pulse 78  Temp(Src) 98.4 F (36.9 C) (Oral)  Resp 18  Ht $R'5\' 11"'IF$  (1.803 m)  Wt 186 lb 4.6 oz (84.5 kg)  BMI 25.99 kg/m2  SpO2 98%  Physical Exam  Constitutional: He is oriented to person, place, and time. No distress.  HENT:  Head: Normocephalic and atraumatic.  Neck: No tracheal deviation present.  Cardiovascular: Normal rate and regular rhythm.  Exam reveals no gallop and no friction rub.   No murmur heard. Pulmonary/Chest: Effort normal and breath sounds normal. No respiratory distress. He has no wheezes. He has no rales.  Abdominal: Soft. Bowel sounds are normal. He exhibits no distension. There is no tenderness.  Neurological: He is alert and oriented to person, place, and time.  Skin: He is not diaphoretic.  Ext: Right arm AVF  Mallampati Score:  MD Evaluation Airway: WNL Heart: WNL Abdomen: WNL Chest/ Lungs: WNL ASA  Classification: 3 Mallampati/Airway Score: Two  Imaging: Dg Chest 2 View  01/26/2015   CLINICAL DATA:  One day history of shortness of breath  EXAM: CHEST  2 VIEW  COMPARISON:  November 29, 2014  FINDINGS: There is no appreciable edema or consolidation. Heart size and pulmonary vascularity are within normal limits. No adenopathy. There is mild degenerative change in the thoracic spine.  IMPRESSION: No edema or consolidation.   Electronically Signed   By: Lowella Grip III M.D.   On: 01/26/2015 16:54   Dg Bone Survey Met  01/29/2015   CLINICAL DATA:  Monoclonal gammopathy  EXAM: METASTATIC BONE SURVEY  COMPARISON:  None.  FINDINGS: No lytic lesions in the skull.  No lytic lesions in the bilateral upper extremities.  Degenerative changes of the cervical spine. Possible small lytic lesion in the anterior aspect of the  C5 vertebral body, equivocal.  Mild degenerative changes of the thoracic spine. No lytic lesions. Visualized lungs are clear.  Mild degenerative changes of the lumbar spine. No lytic lesions in the visualized lumbar spine or pelvis. Mild degenerative changes of the bilateral hips.  No lytic lesions in the visualized bilateral lower extremities. Status post ORIF of the proximal right tibia.  IMPRESSION: Possible small lytic lesion in the anterior C5 vertebral body, equivocal.  Otherwise, no lytic lesions are evident in the visualized axial or appendicular skeleton.   Electronically Signed   By: Julian Hy M.D.   On: 01/29/2015 21:06    Labs:  CBC:  Recent Labs  01/27/15 0300 01/28/15 1430 01/29/15 1613 01/30/15 0800  WBC 10.4 9.5 9.5 11.4*  HGB 7.3* 8.0* 7.8* 7.8*  HCT 21.6*  22.2* 24.7* 24.2* 24.8*  PLT 352 409* 355 361    COAGS: No results for input(s): INR, APTT in the last 8760 hours.  BMP:  Recent Labs  01/26/15 1615 01/27/15 0300 01/28/15 1430 01/30/15 0800  NA 134* 136 133* 135  K 4.4 4.1 4.5 4.1  CL 107 108 103 100*  CO2 16* 19* 20* 27  GLUCOSE 156* 119* 189* 115*  BUN 63* 67* 49* 22*  CALCIUM 8.8* 8.5* 8.4* 8.1*  CREATININE 5.96* 6.31* 5.55* 3.87*  GFRNONAA 10* 9* 11* 16*  GFRAA 11* 11* 12* 19*    LIVER FUNCTION  TESTS:  Recent Labs  08/05/14 0423  09/14/14 1228 01/26/15 1615 01/27/15 0300 01/28/15 1430  BILITOT 0.5  --  0.5 1.6* 1.1  --   AST 20  --  21 40 25  --   ALT 17  --  16 51 40  --   ALKPHOS 139*  --  75 225* 188*  --   PROT 6.4  --  6.5 6.9 6.3*  --   ALBUMIN 1.9*  < > 2.6* 2.5* 2.2* 2.3*  < > = values in this interval not displayed.  Assessment and Plan: Anemia ESRD on HD Elevated monoclonal protein Seen by Oncology/Heme Scheduled for image guided bone marrow biopsy on Tuesday 5/31, will make NPO and order labs Risks and Benefits discussed with the patient including, but not limited to bleeding, infection, damage to adjacent structures or low yield requiring additional tests. All of the patient's questions were answered, patient is agreeable to proceed. Consent signed and in chart.   Thank you for this interesting consult.  I greatly enjoyed meeting [name] and look forward to participating in their care.  SignedHedy Jacob 01/30/2015, 11:42 AM   I spent a total of 20 Minutes in face to face in clinical consultation, greater than 50% of which was counseling/coordinating care for anemia, renal failure and elevated monoclonal protein.

## 2015-01-30 NOTE — Progress Notes (Signed)
[facility] ROUNDING NOTE   Subjective:   Interval History: no complaints today  Objective:  Vital signs in last 24 hours:  Temp:  [97.7 F (36.5 C)-98.4 F (36.9 C)] 98.4 F (36.9 C) (05/29 0900) Pulse Rate:  [75-85] 78 (05/29 0900) Resp:  [16-20] 18 (05/29 0900) BP: (128-168)/(66-93) 130/75 mmHg (05/29 0900) SpO2:  [97 %-100 %] 98 % (05/29 0900) Weight:  [82 kg (180 lb 12.4 oz)-84.5 kg (186 lb 4.6 oz)] 84.5 kg (186 lb 4.6 oz) (05/28 2047)  Weight change: -2.3 kg (-5 lb 1.1 oz) Filed Weights   01/29/15 1440 01/29/15 1800 01/29/15 2047  Weight: 83.7 kg (184 lb 8.4 oz) 82 kg (180 lb 12.4 oz) 84.5 kg (186 lb 4.6 oz)    Intake/Output: I/O last 3 completed shifts: In: 600 [P.O.:600] Out: 3096 [Urine:1825; Other:1270; Stool:1]   Intake/Output this shift:  Total I/O In: 240 [P.O.:240] Out: 502 [Urine:500; Stool:2]  CVS- RRR RS- CTA ABD- BS present soft non-distended EXT- no edema   Basic Metabolic Panel:  Recent Labs Lab 01/26/15 1615 01/27/15 0300 01/28/15 1430 01/30/15 0800  NA 134* 136 133* 135  K 4.4 4.1 4.5 4.1  CL 107 108 103 100*  CO2 16* 19* 20* 27  GLUCOSE 156* 119* 189* 115*  BUN 63* 67* 49* 22*  CREATININE 5.96* 6.31* 5.55* 3.87*  CALCIUM 8.8* 8.5* 8.4* 8.1*  PHOS  --   --  5.0*  --     Liver Function Tests:  Recent Labs Lab 01/26/15 1615 01/27/15 0300 01/28/15 1430  AST 40 25  --   ALT 51 40  --   ALKPHOS 225* 188*  --   BILITOT 1.6* 1.1  --   PROT 6.9 6.3*  --   ALBUMIN 2.5* 2.2* 2.3*   No results for input(s): LIPASE, AMYLASE in the last 168 hours. No results for input(s): AMMONIA in the last 168 hours.  CBC:  Recent Labs Lab 01/26/15 1605 01/27/15 0300 01/28/15 1430 01/29/15 1613 01/30/15 0800  WBC 11.8* 10.4 9.5 9.5 11.4*  HGB 8.3* 7.3* 8.0* 7.8* 7.8*  HCT 24.7* 21.6*  22.2* 24.7* 24.2* 24.8*  MCV 91.8 90.0 91.8 93.1 95.4  PLT 397 352 409* 355 361    Cardiac Enzymes:  Recent Labs Lab  01/26/15 2335 01/27/15 0300 01/27/15 1011  CKTOTAL 137  --   --   CKMB 5.2*  --   --   TROPONINI 0.07* 0.06* 0.05*    BNP: Invalid input(s): POCBNP  CBG:  Recent Labs Lab 01/29/15 0731 01/29/15 1144 01/29/15 1848 01/30/15 0723 01/30/15 1147  GLUCAP 100* 146* 104* 95 151*    Microbiology: Results for orders placed or performed during the hospital encounter of 01/26/15  MRSA PCR Screening     Status: None   Collection Time: 01/26/15  9:01 PM  Result Value Ref Range Status   MRSA by PCR NEGATIVE NEGATIVE Final    Comment:        The GeneXpert MRSA Assay (FDA approved for NASAL specimens only), is one component of a comprehensive MRSA colonization surveillance program. It is not intended to diagnose MRSA infection nor to guide or monitor treatment for MRSA infections.     Coagulation Studies: No results for input(s): LABPROT, INR in the last 72 hours.  Urinalysis: No results for input(s): COLORURINE, LABSPEC, PHURINE, GLUCOSEU, HGBUR, BILIRUBINUR, KETONESUR, PROTEINUR, UROBILINOGEN, NITRITE, LEUKOCYTESUR in the last 72 hours.  Invalid input(s): APPERANCEUR    Imaging: Dg Bone Survey Met  01/29/2015  CLINICAL DATA:  Monoclonal gammopathy  EXAM: METASTATIC BONE SURVEY  COMPARISON:  None.  FINDINGS: No lytic lesions in the skull.  No lytic lesions in the bilateral upper extremities.  Degenerative changes of the cervical spine. Possible small lytic lesion in the anterior aspect of the C5 vertebral body, equivocal.  Mild degenerative changes of the thoracic spine. No lytic lesions. Visualized lungs are clear.  Mild degenerative changes of the lumbar spine. No lytic lesions in the visualized lumbar spine or pelvis. Mild degenerative changes of the bilateral hips.  No lytic lesions in the visualized bilateral lower extremities. Status post ORIF of the proximal right tibia.  IMPRESSION: Possible small lytic lesion in the anterior C5 vertebral body, equivocal.  Otherwise, no  lytic lesions are evident in the visualized axial or appendicular skeleton.   Electronically Signed   By: [name] M.D.   On: 01/29/2015 21:06     Medications:     . aspirin  325 mg Oral Daily  . atorvastatin  80 mg Oral q1800  . calcitRIOL  0.25 mcg Oral Daily  . calcium acetate  1,334 mg Oral TID WC  . carvedilol  25 mg Oral BID WC  . darbepoetin (ARANESP) injection - DIALYSIS  150 mcg Intravenous Q Fri-HD  . famotidine  20 mg Oral Daily  . ferric gluconate (FERRLECIT/NULECIT) IV  125 mg Intravenous Daily  . FLUoxetine  10 mg Oral Daily  . heparin  5,000 Units Subcutaneous 3 times per day  . insulin aspart  0-5 Units Subcutaneous QHS  . insulin aspart  0-9 Units Subcutaneous TID WC  . montelukast  10 mg Oral QHS   acetaminophen-codeine  Assessment/ Plan:  [name] is a 54 y.o. male with past medical history significant for hypertension, diabetes mellitus, peripheral arterial disease as well as quickly progressive chronic kidney disease due to above. Courses also been complicated by noncompliance and substance abuse.SPEP which was positive and possible suicidal ID ideation  1. ESRD New start Dialysis #3 tomorrow 2. Hypertension/volume - Continue to challenge  3. Anemia - Iron and Aranesp  4. Bone disease related to CKD. Binders and vitamin D 5. Positive SPEP- Heme to see    LOS: 4 [name] W _0 _1 :14 PM

## 2015-01-30 NOTE — Progress Notes (Signed)
Progress Note  [name] OLM:[id number] DOB: 01-Jun-1961 DOA: 01/26/2015 PCP: Minerva Ends, MD  Admit HPI / Brief Narrative: 54 yo M Hx substance abuse, Diastolic CHF, hypertension, DM2, and cocaine abuse who presented with SOB and swelling of the legs for a couple of days. Pt noted sscp, " sharp" radiating to the neck. Pt states that pain was constant and associate with n/v. Pt noted some blood w/ emesis after multiple episodes of n/v.In the ED a CXR was negative and an ekg noted nsr, no st -t changes c/w acute ischemia.  HPI/Subjective: Denies any complaints, feels much better. Seen by hematology, bone marrow biopsy is going to be done on Tuesday [facility] Day weekend)  Discharge barriers: BM biopsy, set up of outpatient dialysis.  Assessment/Plan:   ESRD, HD initiated during this hospital stay Presented with creatinine of 6.78, not that far from his baseline. According to nephrology patient renal function deteriorated recently. Patient started on hemodialysis, started on the "CLIP" process  Monoclonal gammopathy Positive SPEP with IgA monoclonal gammopathy. MGUS, monoclonal gammopathy of "renal" significance versus multiple myeloma. Hematology consulted, appreciate their recommendation, Dr. Lindi Adie recommended BM biopsy, it will be done on Tuesday.  Substance abuse Positive for cocaine and opiates - the patient has been advised to abstain from all substances of abuse  Suicidal ideation Patient evaluated by psychiatry, currently no homicidal or suicidal ideation. Recommended outpatient follow-up for substance abuse.  Acute diastolic CHF Previously patient admitted with pulmonary edema, LVEF is 55-60%. I'm not 100% sure that patient has diastolic CHF, this is likely noncardiogenic pulmonary edema secondary to CKD. Chest x-ray is clear now.  Elevated troponin -Most likely secondary to cocaine use in the setting of ESRD - chest pain was not anginal in  character and has spontaneously resolved - patient advised to abstain completely and absolutely from cocaine  DM2 CBG currently reasonably controlled, A1c is 5.8 from March 2016  Anemia Anemia panel suggestive of anemia related to chronic kidney disease, normal folate and B12.   Code Status: FULL Family Communication: no family present at time of exam Disposition Plan: Await results of hematology consultation - ongoing care of ESRD per nephrology  Consultants: Nephrology Psych   Procedures: TTE - pending   Antibiotics: none  DVT prophylaxis: SCD  Objective: Blood pressure 130/75, pulse 78, temperature 98.4 F (36.9 C), temperature source Oral, resp. rate 18, height 5' 11"  (1.803 m), weight 84.5 kg (186 lb 4.6 oz), SpO2 98 %.  Intake/Output Summary (Last 24 hours) at 01/30/15 1324 Last data filed at 01/30/15 0100  Gross per 24 hour  Intake    600 ml  Output   2523 ml  Net  -1923 ml   Exam: General: A/O 4, NAD, No acute respiratory distress Eyes: Negative headache, eye pain, double vision, scotomas, floaters, negative retinal hemorrhage ENT: Negative Runny nose, negative ear pain, negative tinnitus, negative gingival bleeding,  Neck:  Negative scars, masses, torticollis, lymphadenopathy, JVD Lungs: Clear to auscultation bilaterally without wheezes or crackles Cardiovascular: Regular rate and rhythm without murmur gallop or rub normal S1 and S2 Abdomen:negative abdominal pain, negative dysphagia, Nontender, nondistended, soft, bowel sounds positive, no rebound, no ascites, no appreciable mass Extremities: No significant cyanosis, clubbing. Bilateral lower extremity edemaLt >> Rt 2+; AV fistula with good thrill on the right antecubital. Psychiatric:  Negative depression, negative anxiety, negative fatigue, negative mania currently very calm, states no desire to commit suicide/homicide. Neurologic:  Cranial nerves II through XII intact, tongue/uvula midline, all extremities  muscle strength 5/5, sensation intact throughout, , negative dysarthria, negative expressive aphasia, negative receptive aphasia.   Data Reviewed: Basic Metabolic Panel:  Recent Labs Lab 01/26/15 1615 01/27/15 0300 01/28/15 1430 01/30/15 0800  NA 134* 136 133* 135  K 4.4 4.1 4.5 4.1  CL 107 108 103 100*  CO2 16* 19* 20* 27  GLUCOSE 156* 119* 189* 115*  BUN 63* 67* 49* 22*  CREATININE 5.96* 6.31* 5.55* 3.87*  CALCIUM 8.8* 8.5* 8.4* 8.1*  PHOS  --   --  5.0*  --    Liver Function Tests:  Recent Labs Lab 01/26/15 1615 01/27/15 0300 01/28/15 1430  AST 40 25  --   ALT 51 40  --   ALKPHOS 225* 188*  --   BILITOT 1.6* 1.1  --   PROT 6.9 6.3*  --   ALBUMIN 2.5* 2.2* 2.3*   CBC:  Recent Labs Lab 01/26/15 1605 01/27/15 0300 01/28/15 1430 01/29/15 1613 01/30/15 0800  WBC 11.8* 10.4 9.5 9.5 11.4*  HGB 8.3* 7.3* 8.0* 7.8* 7.8*  HCT 24.7* 21.6*  22.2* 24.7* 24.2* 24.8*  MCV 91.8 90.0 91.8 93.1 95.4  PLT 397 352 409* 355 361   Cardiac Enzymes:  Recent Labs Lab 01/26/15 2335 01/27/15 0300 01/27/15 1011  CKTOTAL 137  --   --   CKMB 5.2*  --   --   TROPONINI 0.07* 0.06* 0.05*   BNP (last 3 results)  Recent Labs  09/14/14 1228 11/29/14 1025 01/26/15 1615  BNP 174.3* 1981.6* 2258.1*    ProBNP (last 3 results)  Recent Labs  08/04/14 1007  PROBNP 52226.0*    CBG:  Recent Labs Lab 01/29/15 0731 01/29/15 1144 01/29/15 1848 01/30/15 0723 01/30/15 1147  GLUCAP 100* 146* 104* 95 151*    Recent Results (from the past 240 hour(s))  MRSA PCR Screening     Status: None   Collection Time: 01/26/15  9:01 PM  Result Value Ref Range Status   MRSA by PCR NEGATIVE NEGATIVE Final    Comment:        The GeneXpert MRSA Assay (FDA approved for NASAL specimens only), is one component of a comprehensive MRSA colonization surveillance program. It is not intended to diagnose MRSA infection nor to guide or monitor treatment for MRSA infections.       Studies:  Recent x-ray studies have been reviewed in detail by the Attending Physician  Scheduled Meds:  Scheduled Meds: . aspirin  325 mg Oral Daily  . atorvastatin  80 mg Oral q1800  . calcitRIOL  0.25 mcg Oral Daily  . calcium acetate  1,334 mg Oral TID WC  . carvedilol  25 mg Oral BID WC  . darbepoetin (ARANESP) injection - DIALYSIS  150 mcg Intravenous Q Fri-HD  . famotidine  20 mg Oral Daily  . ferric gluconate (FERRLECIT/NULECIT) IV  125 mg Intravenous Daily  . FLUoxetine  10 mg Oral Daily  . heparin  5,000 Units Subcutaneous 3 times per day  . insulin aspart  0-5 Units Subcutaneous QHS  . insulin aspart  0-9 Units Subcutaneous TID WC  . montelukast  10 mg Oral QHS    Time spent on care of this patient: 35 mins  Miamarie Moll A, MD [facility] For Consults/Admissions - Flow Manager - [phone] Office  [phone]  Contact MD directly via text page:      [email or web address]      password [facility]  01/30/2015, 1:24 PM   LOS: 4 days

## 2015-01-30 NOTE — Progress Notes (Signed)
Patient complaints of not feeling right. Thinks it may be small portion sizes or medications, like insulin. Patient required 2 units novology x 2 today. Noted sbp <100 at 1800. Rechecked manual, >130. Orthostatics assessed. No decrease in bp with position changes. Discussed changes with body d/t new onset dialysis. Encouraged to discuss symptoms with MD, and to call RN with worsening symptoms. Will continue to monitor. Bartholomew Crews, RN

## 2015-01-31 LAB — GLUCOSE, CAPILLARY
GLUCOSE-CAPILLARY: 163 mg/dL — AB (ref 65–99)
GLUCOSE-CAPILLARY: 185 mg/dL — AB (ref 65–99)
GLUCOSE-CAPILLARY: 190 mg/dL — AB (ref 65–99)
Glucose-Capillary: 184 mg/dL — ABNORMAL HIGH (ref 65–99)

## 2015-01-31 LAB — CBC
HEMATOCRIT: 23.4 % — AB (ref 39.0–52.0)
Hemoglobin: 7.4 g/dL — ABNORMAL LOW (ref 13.0–17.0)
MCH: 30.5 pg (ref 26.0–34.0)
MCHC: 31.6 g/dL (ref 30.0–36.0)
MCV: 96.3 fL (ref 78.0–100.0)
PLATELETS: 358 10*3/uL (ref 150–400)
RBC: 2.43 MIL/uL — ABNORMAL LOW (ref 4.22–5.81)
RDW: 16.3 % — ABNORMAL HIGH (ref 11.5–15.5)
WBC: 13.3 10*3/uL — ABNORMAL HIGH (ref 4.0–10.5)

## 2015-01-31 LAB — RENAL FUNCTION PANEL
ANION GAP: 4 — AB (ref 5–15)
Albumin: 2.3 g/dL — ABNORMAL LOW (ref 3.5–5.0)
BUN: 35 mg/dL — ABNORMAL HIGH (ref 6–20)
CALCIUM: 8.5 mg/dL — AB (ref 8.9–10.3)
CHLORIDE: 102 mmol/L (ref 101–111)
CO2: 27 mmol/L (ref 22–32)
CREATININE: 5.04 mg/dL — AB (ref 0.61–1.24)
GFR calc Af Amer: 14 mL/min — ABNORMAL LOW (ref >60)
GFR, EST NON AFRICAN AMERICAN: 12 mL/min — AB (ref >60)
GLUCOSE: 216 mg/dL — AB (ref 65–99)
PHOSPHORUS: 3.5 mg/dL (ref 2.5–4.6)
Potassium: 4.2 mmol/L (ref 3.5–5.1)
SODIUM: 133 mmol/L — AB (ref 135–145)

## 2015-01-31 MED ORDER — ONDANSETRON HCL 4 MG/2ML IJ SOLN
4.0000 mg | Freq: Four times a day (QID) | INTRAMUSCULAR | Status: DC | PRN
  Administered 2015-01-31 – 2015-02-01 (×2): 4 mg via INTRAVENOUS
  Filled 2015-01-31 (×3): qty 2

## 2015-01-31 MED ORDER — ALTEPLASE 2 MG IJ SOLR
2.0000 mg | Freq: Once | INTRAMUSCULAR | Status: DC | PRN
  Filled 2015-01-31: qty 2

## 2015-01-31 MED ORDER — NEPRO/CARBSTEADY PO LIQD: 237.0000 mL | ORAL | Status: DC | PRN

## 2015-01-31 MED ORDER — LIDOCAINE HCL (PF) 1 % IJ SOLN: 5.0000 mL | INTRAMUSCULAR | Status: DC | PRN

## 2015-01-31 MED ORDER — HEPARIN SODIUM (PORCINE) 1000 UNIT/ML DIALYSIS
1000.0000 [IU] | INTRAMUSCULAR | Status: DC | PRN
  Filled 2015-01-31: qty 1

## 2015-01-31 MED ORDER — LIDOCAINE-PRILOCAINE 2.5-2.5 % EX CREA: 1.0000 "application " | TOPICAL_CREAM | CUTANEOUS | Status: DC | PRN

## 2015-01-31 MED ORDER — PENTAFLUOROPROP-TETRAFLUOROETH EX AERO
1.0000 | INHALATION_SPRAY | CUTANEOUS | Status: DC | PRN
Start: 2015-01-31 — End: 2015-01-31

## 2015-01-31 MED ORDER — SODIUM CHLORIDE 0.9 % IV SOLN: 100.0000 mL | INTRAVENOUS | Status: DC | PRN

## 2015-01-31 NOTE — Progress Notes (Signed)
Patient ID: Braxdon Gappa, male   DOB: [birth year], 54 y.o.   MRN: [id number]  [facility] Progress Note   Assessment/ Plan:   1. Volume overload/anasarca/chest pain: No evidence of ACS, improving with dialysis 2.ESRD: new start hemodialysis in a patient with progressive chronic kidney disease from underlying hypertension/DM/PVD/polysubstance abuse. Anticipated 4th dialysis treatment today-will follow up on outpatient dialysis placement status 3. Anemia: No overt losses, hemoglobin remains low-adjusting Aranesp therapy. Getting intravenous iron for iron deficiency. 4. CKD-MBD: On calcium acetate for phosphorus binding, continue calcitriol PTH suppression 5. Nutrition: Low albumin as a result of his chronic kidney disease/was doubly poor dietary choices-anticipated to improve with dialysis/improvement of nutritional status, continue ON as 6. Hypertension: Blood pressure is fairly controlled on monotherapy with carvedilol-monitor with ultrafiltration 7. M spike on SPEP: Seen by hematology and plans in place for bone marrow biopsy tomorrow to assess for possible plasma cell dyscrasia  Subjective:   Reports to have had some nausea and not feeling well overnight-attributes this to the small portions of meals that he is getting as well as his "body's attempt to adjust to insulin"    Objective:   BP 140/70 mmHg  Pulse 80  Temp(Src) 98 F (36.7 C) (Oral)  Resp 17  Ht 5' 11"  (1.803 m)  Wt 85.8 kg (189 lb 2.5 oz)  BMI 26.39 kg/m2  SpO2 97%  Physical Exam: Gen: Comfortably resting in bed CVS: Pulse regular in rate and rhythm Resp: Clear to auscultation, no rales Abd: Soft, obese, nontender Ext: Trace to 1+ lower extremity edema  Labs: BMET  Recent Labs Lab 01/26/15 1615 01/27/15 0300 01/28/15 1430 01/30/15 0800  NA 134* 136 133* 135  K 4.4 4.1 4.5 4.1  CL 107 108 103 100*  CO2 16* 19* 20* 27  GLUCOSE 156* 119* 189* 115*  BUN 63* 67* 49* 22*  CREATININE 5.96*  6.31* 5.55* 3.87*  CALCIUM 8.8* 8.5* 8.4* 8.1*  PHOS  --   --  5.0*  --    CBC  Recent Labs Lab 01/27/15 0300 01/28/15 1430 01/29/15 1613 01/30/15 0800  WBC 10.4 9.5 9.5 11.4*  HGB 7.3* 8.0* 7.8* 7.8*  HCT 21.6*  22.2* 24.7* 24.2* 24.8*  MCV 90.0 91.8 93.1 95.4  PLT 352 409* 355 361   Medications:    . aspirin  325 mg Oral Daily  . atorvastatin  80 mg Oral q1800  . calcitRIOL  0.25 mcg Oral Daily  . calcium acetate  1,334 mg Oral TID WC  . carvedilol  25 mg Oral BID WC  . darbepoetin (ARANESP) injection - DIALYSIS  150 mcg Intravenous Q Fri-HD  . famotidine  20 mg Oral Daily  . ferric gluconate (FERRLECIT/NULECIT) IV  125 mg Intravenous Daily  . FLUoxetine  10 mg Oral Daily  . heparin  5,000 Units Subcutaneous 3 times per day  . insulin aspart  0-5 Units Subcutaneous QHS  . insulin aspart  0-9 Units Subcutaneous TID WC  . montelukast  10 mg Oral QHS   Elmarie Shiley, MD 01/31/2015, 8:49 AM

## 2015-01-31 NOTE — Evaluation (Signed)
Occupational Therapy Evaluation Patient Details Name: [name] MRN: [id number] DOB: 03/08/1961 Today's Date: 01/31/2015    History of Present Illness 54 yo male with hx of CHF (ef=55%), hypertension, dm2, cocaine abuse, left toe amputation, apparently c/o sob and swelling of the legs for the past couple of days   Clinical Impression   Pt is performing ADL and ADL transfers independently.  Instructed in fall prevention as he has a hx of toe amputation, LE injury, leg cramps and falls. Agree with PT recommendation for cane. Educated in energy conservation strategies as pt is a new dialysis pt and reports fatigue after treatment.  No further OT needs.      Follow Up Recommendations  No OT follow up    Equipment Recommendations   (single point cane)    Recommendations for Other Services       Precautions / Restrictions Precautions Precautions: None      Mobility Bed Mobility Overal bed mobility: Independent                Transfers Overall transfer level: Independent                    Balance                                            ADL Overall ADL's : Independent                                             Vision     Perception     Praxis      Pertinent Vitals/Pain Pain Assessment: No/denies pain     Hand Dominance Right   Extremity/Trunk Assessment Upper Extremity Assessment Upper Extremity Assessment: Overall WFL for tasks assessed   Lower Extremity Assessment Lower Extremity Assessment: Overall WFL for tasks assessed   Cervical / Trunk Assessment Cervical / Trunk Assessment: Normal   Communication Communication Communication: No difficulties   Cognition Arousal/Alertness: Awake/alert Behavior During Therapy: WFL for tasks assessed/performed Overall Cognitive Status: Within Functional Limits for tasks assessed                     General Comments       Exercises       Shoulder Instructions      Home Living Family/patient expects to be discharged to:: Private residence Living Arrangements: Spouse/significant other (96 year old and 61 year old children) Available Help at Discharge: Family;Available 24 hours/day Type of Home: Apartment Home Access: Level entry     Home Layout: One level     Bathroom Shower/Tub: Teacher, early years/pre: Standard     Home Equipment: None          Prior Functioning/Environment Level of Independence: Independent        Comments: 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

## 2015-01-31 NOTE — Plan of Care (Signed)
Problem: Food- and Nutrition-Related Knowledge Deficit (NB-1.1) Goal: Nutrition education Formal process to instruct or train a patient/client in a skill or to impart knowledge to help patients/clients voluntarily manage or modify food choices and eating behavior to maintain or improve health. Outcome: Completed/Met Date Met:  01/31/15 Nutrition Education Note  RD consulted for Renal/carb mod education. Provided "Healthy Eating with Kidney Disease" as well as "Carbohydrate counting for people fir diabetes" handout to patient. Reviewed food groups and provided written recommended serving sizes specifically determined for patient's current nutritional status. Discussed patient's regular diet recall.  Explained why diet restrictions are needed and provided lists of foods to limit/avoid that are high potassium, sodium, and phosphorus. Provided specific recommendations on safer alternatives of these foods. Strongly encouraged compliance of this diet. Provided fluid restriction information. Teach back method used.  Expect good compliance.  Body mass index is 26.39 kg/(m^2). Pt meets criteria for overweight based on current BMI.  Current diet order is renal/carb mod with 1800 ml fluids, patient is consuming approximately 100% of meals at this time. Pt additionally requests snacks between meals. RD to order. Labs and medications reviewed. No further nutrition interventions warranted at this time. RD contact information provided. If additional nutrition issues arise, please re-consult RD.  Kallie Locks, MS, RD, LDN Pager # [phone] After hours/ weekend pager # [phone]

## 2015-01-31 NOTE — Progress Notes (Signed)
Progress Note  [name] MAU:[id number] DOB: 04-08-1961 DOA: 01/26/2015 PCP: Minerva Ends, MD  Admit HPI / Brief Narrative: 54 yo M Hx substance abuse, Diastolic CHF, hypertension, DM2, and cocaine abuse who presented with SOB and swelling of the legs for a couple of days. Pt noted sscp, " sharp" radiating to the neck. Pt states that pain was constant and associate with n/v. Pt noted some blood w/ emesis after multiple episodes of n/v.In the ED a CXR was negative and an ekg noted nsr, no st -t changes c/w acute ischemia.  HPI/Subjective: Feels better, denies fever chills. Bone marrow biopsy today tomorrow a.m. Likely to be able to discharge after biopsy.  Discharge barriers: BM biopsy.  Assessment/Plan:   ESRD, HD initiated during this hospital stay Presented with creatinine of 6.78, not that far from his baseline. According to nephrology patient renal function deteriorated recently. Patient started on hemodialysis, started on the "CLIP" process  Monoclonal gammopathy Positive SPEP with IgA monoclonal gammopathy. MGUS, monoclonal gammopathy of "renal" significance versus multiple myeloma. Hematology consulted, appreciate their recommendation, Dr. Lindi Adie recommended BM biopsy, it will be done on Tuesday. Rule out plasma blood dyscrasias, follow-up was oncology as outpatient after BM biopsy.  Substance abuse Positive for cocaine and opiates - the patient has been advised to abstain from all substances of abuse  Suicidal ideation Patient evaluated by psychiatry, currently no homicidal or suicidal ideation. Recommended outpatient follow-up for substance abuse.  Acute diastolic CHF Previously patient admitted with pulmonary edema, LVEF is 55-60%. I'm not 100% sure that patient has diastolic CHF, this is likely noncardiogenic pulmonary edema secondary to CKD. Chest x-ray is clear now.  Elevated troponin -Most likely secondary to cocaine use in the setting  of ESRD - chest pain was not anginal in character and has spontaneously resolved - patient advised to abstain completely and absolutely from cocaine  DM2 CBG currently reasonably controlled, A1c is 5.8 from March 2016  Anemia Anemia panel suggestive of anemia related to chronic kidney disease, normal folate and B12.   Code Status: FULL Family Communication: no family present at time of exam Disposition Plan: Await results of hematology consultation - ongoing care of ESRD per nephrology  Consultants: Nephrology Psych   Procedures: TTE - pending   Antibiotics: none  DVT prophylaxis: SCD  Objective: Blood pressure 140/70, pulse 80, temperature 98 F (36.7 C), temperature source Oral, resp. rate 17, height 5' 11"  (1.803 m), weight 85.8 kg (189 lb 2.5 oz), SpO2 97 %.  Intake/Output Summary (Last 24 hours) at 01/31/15 1253 Last data filed at 01/31/15 1145  Gross per 24 hour  Intake   1270 ml  Output    250 ml  Net   1020 ml   Exam: General: A/O 4, NAD, No acute respiratory distress Eyes: Negative headache, eye pain, double vision, scotomas, floaters, negative retinal hemorrhage ENT: Negative Runny nose, negative ear pain, negative tinnitus, negative gingival bleeding,  Neck:  Negative scars, masses, torticollis, lymphadenopathy, JVD Lungs: Clear to auscultation bilaterally without wheezes or crackles Cardiovascular: Regular rate and rhythm without murmur gallop or rub normal S1 and S2 Abdomen:negative abdominal pain, negative dysphagia, Nontender, nondistended, soft, bowel sounds positive, no rebound, no ascites, no appreciable mass Extremities: No significant cyanosis, clubbing. Bilateral lower extremity edemaLt >> Rt 2+; AV fistula with good thrill on the right antecubital. Psychiatric:  Negative depression, negative anxiety, negative fatigue, negative mania currently very calm, states no desire to commit suicide/homicide. Neurologic:  Cranial nerves II through XII  intact,  tongue/uvula midline, all extremities muscle strength 5/5, sensation intact throughout, , negative dysarthria, negative expressive aphasia, negative receptive aphasia.   Data Reviewed: Basic Metabolic Panel:  Recent Labs Lab 01/26/15 1615 01/27/15 0300 01/28/15 1430 01/30/15 0800  NA 134* 136 133* 135  K 4.4 4.1 4.5 4.1  CL 107 108 103 100*  CO2 16* 19* 20* 27  GLUCOSE 156* 119* 189* 115*  BUN 63* 67* 49* 22*  CREATININE 5.96* 6.31* 5.55* 3.87*  CALCIUM 8.8* 8.5* 8.4* 8.1*  PHOS  --   --  5.0*  --    Liver Function Tests:  Recent Labs Lab 01/26/15 1615 01/27/15 0300 01/28/15 1430  AST 40 25  --   ALT 51 40  --   ALKPHOS 225* 188*  --   BILITOT 1.6* 1.1  --   PROT 6.9 6.3*  --   ALBUMIN 2.5* 2.2* 2.3*   CBC:  Recent Labs Lab 01/26/15 1605 01/27/15 0300 01/28/15 1430 01/29/15 1613 01/30/15 0800  WBC 11.8* 10.4 9.5 9.5 11.4*  HGB 8.3* 7.3* 8.0* 7.8* 7.8*  HCT 24.7* 21.6*  22.2* 24.7* 24.2* 24.8*  MCV 91.8 90.0 91.8 93.1 95.4  PLT 397 352 409* 355 361   Cardiac Enzymes:  Recent Labs Lab 01/26/15 2335 01/27/15 0300 01/27/15 1011  CKTOTAL 137  --   --   CKMB 5.2*  --   --   TROPONINI 0.07* 0.06* 0.05*   BNP (last 3 results)  Recent Labs  09/14/14 1228 11/29/14 1025 01/26/15 1615  BNP 174.3* 1981.6* 2258.1*    ProBNP (last 3 results)  Recent Labs  08/04/14 1007  PROBNP 52226.0*    CBG:  Recent Labs Lab 01/30/15 1147 01/30/15 1638 01/30/15 2233 01/31/15 0740 01/31/15 1112  GLUCAP 151* 153* 248* 163* 184*    Recent Results (from the past 240 hour(s))  MRSA PCR Screening     Status: None   Collection Time: 01/26/15  9:01 PM  Result Value Ref Range Status   MRSA by PCR NEGATIVE NEGATIVE Final    Comment:        The GeneXpert MRSA Assay (FDA approved for NASAL specimens only), is one component of a comprehensive MRSA colonization surveillance program. It is not intended to diagnose MRSA infection nor to guide or monitor  treatment for MRSA infections.      Studies:  Recent x-ray studies have been reviewed in detail by the Attending Physician  Scheduled Meds:  Scheduled Meds: . aspirin  325 mg Oral Daily  . atorvastatin  80 mg Oral q1800  . calcitRIOL  0.25 mcg Oral Daily  . calcium acetate  1,334 mg Oral TID WC  . carvedilol  25 mg Oral BID WC  . darbepoetin (ARANESP) injection - DIALYSIS  150 mcg Intravenous Q Fri-HD  . famotidine  20 mg Oral Daily  . ferric gluconate (FERRLECIT/NULECIT) IV  125 mg Intravenous Daily  . FLUoxetine  10 mg Oral Daily  . heparin  5,000 Units Subcutaneous 3 times per day  . insulin aspart  0-5 Units Subcutaneous QHS  . insulin aspart  0-9 Units Subcutaneous TID WC  . montelukast  10 mg Oral QHS    Time spent on care of this patient: 35 mins  Jaisha Villacres A, MD [facility] For Consults/Admissions - Flow Manager - [phone] Office  [phone]  Contact MD directly via text page:      [email or web address]      password [facility]  01/31/2015, 12:53 PM   LOS: 5  days

## 2015-02-01 LAB — PROTIME-INR
INR: 1.23 (ref 0.00–1.49)
PROTHROMBIN TIME: 15.6 s — AB (ref 11.6–15.2)

## 2015-02-01 LAB — IGG, IGA, IGM
IGA: 1082 mg/dL — AB (ref 90–386)
IgG (Immunoglobin G), Serum: 903 mg/dL (ref 700–1600)
IgM, Serum: 28 mg/dL (ref 20–172)

## 2015-02-01 LAB — BETA 2 MICROGLOBULIN, SERUM: Beta-2 Microglobulin: 9.2 mg/L — ABNORMAL HIGH (ref 0.6–2.4)

## 2015-02-01 LAB — CBC
HEMATOCRIT: 25.2 % — AB (ref 39.0–52.0)
HEMOGLOBIN: 7.8 g/dL — AB (ref 13.0–17.0)
MCH: 30.5 pg (ref 26.0–34.0)
MCHC: 31 g/dL (ref 30.0–36.0)
MCV: 98.4 fL (ref 78.0–100.0)
Platelets: 324 10*3/uL (ref 150–400)
RBC: 2.56 MIL/uL — AB (ref 4.22–5.81)
RDW: 16.9 % — ABNORMAL HIGH (ref 11.5–15.5)
WBC: 14.1 10*3/uL — ABNORMAL HIGH (ref 4.0–10.5)

## 2015-02-01 LAB — GLUCOSE, CAPILLARY
GLUCOSE-CAPILLARY: 161 mg/dL — AB (ref 65–99)
Glucose-Capillary: 114 mg/dL — ABNORMAL HIGH (ref 65–99)
Glucose-Capillary: 203 mg/dL — ABNORMAL HIGH (ref 65–99)
Glucose-Capillary: 170 mg/dL — ABNORMAL HIGH (ref 65–99)

## 2015-02-01 LAB — PROTEIN ELECTROPHORESIS, SERUM
A/G RATIO SPE: 0.7 (ref 0.7–2.0)
ALPHA-1-GLOBULIN: 0.4 g/dL (ref 0.1–0.4)
ALPHA-2-GLOBULIN: 1 g/dL (ref 0.4–1.2)
Albumin ELP: 2.6 g/dL — ABNORMAL LOW (ref 3.2–5.6)
Beta Globulin: 1.5 g/dL — ABNORMAL HIGH (ref 0.6–1.3)
Gamma Globulin: 0.8 g/dL (ref 0.5–1.6)
Globulin, Total: 3.7 g/dL (ref 2.0–4.5)
M-Spike, %: 0.4 g/dL — ABNORMAL HIGH
TOTAL PROTEIN ELP: 6.3 g/dL (ref 6.0–8.5)

## 2015-02-01 MED ORDER — HEPARIN SODIUM (PORCINE) 5000 UNIT/ML IJ SOLN
5000.0000 [IU] | Freq: Three times a day (TID) | INTRAMUSCULAR | Status: DC
  Administered 2015-02-01 – 2015-02-03 (×4): 5000 [IU] via SUBCUTANEOUS
  Filled 2015-02-01 (×6): qty 1

## 2015-02-01 MED ORDER — FLUOXETINE HCL 20 MG PO CAPS
20.0000 mg | ORAL_CAPSULE | Freq: Every day | ORAL | Status: DC
  Administered 2015-02-02 – 2015-02-03 (×2): 20 mg via ORAL
  Filled 2015-02-01 (×2): qty 1

## 2015-02-01 NOTE — Progress Notes (Signed)
Progress Note  [name] EML:[id number] DOB: 03/22/1961 DOA: 01/26/2015 PCP: Minerva Ends, MD  Admit HPI / Brief Narrative: 54 yo M Hx substance abuse, Diastolic CHF, hypertension, DM2, and cocaine abuse who presented with SOB and swelling of the legs for a couple of days. Pt noted sscp, " sharp" radiating to the neck. Pt states that pain was constant and associate with n/v. Pt noted some blood w/ emesis after multiple episodes of n/v.In the ED a CXR was negative and an ekg noted nsr, no st -t changes c/w acute ischemia.  HPI/Subjective: Report dizziness every now and then, especially after dialysis. Very disappointed that his bone marrow biopsy postponed until tomorrow.   Discharge barriers: BM biopsy to be done in a.m. Not accepted yet on outpatient dialysis center.  Assessment/Plan:   ESRD, HD initiated during this hospital stay Presented with creatinine of 6.78, not that far from his baseline. According to nephrology patient renal function deteriorated recently. Still adjusting to dialysis, appreciate nephrology's efforts for setting outpatient dialysis.  Monoclonal gammopathy Positive SPEP with IgA monoclonal gammopathy. MGUS, monoclonal gammopathy of "renal" significance versus multiple myeloma. Hematology consulted, appreciate their recommendation, Dr. Lindi Adie recommended BM biopsy, to be done a.m. Rule out plasma cell dyscrasias, follow-up was oncology as outpatient after BM biopsy.  Substance abuse Positive for cocaine and opiates - the patient has been advised to abstain from all substances of abuse  Suicidal ideation Patient evaluated by psychiatry, currently no homicidal or suicidal ideation. Recommended outpatient follow-up for substance abuse.  Acute diastolic CHF Previously patient admitted with pulmonary edema, LVEF is 55-60%. I'm not 100% sure that patient has diastolic CHF, this is likely noncardiogenic pulmonary edema secondary to  CKD. Chest x-ray is clear now.  Elevated troponin -Most likely secondary to cocaine use in the setting of ESRD - chest pain was not anginal in character and has spontaneously resolved - patient advised to abstain completely and absolutely from cocaine  DM2 CBG currently reasonably controlled, A1c is 5.8 from March 2016  Anemia Anemia panel suggestive of anemia related to chronic kidney disease, normal folate and B12.   Code Status: FULL Family Communication: no family present at time of exam Disposition Plan: Await results of hematology consultation - ongoing care of ESRD per nephrology  Consultants: Nephrology Psych   Procedures:   Antibiotics: none  DVT prophylaxis: SCD  Objective: Blood pressure 147/75, pulse 72, temperature 98.6 F (37 C), temperature source Oral, resp. rate 17, height 5' 11"  (1.803 m), weight 83.643 kg (184 lb 6.4 oz), SpO2 98 %.  Intake/Output Summary (Last 24 hours) at 02/01/15 1812 Last data filed at 02/01/15 1743  Gross per 24 hour  Intake   1550 ml  Output   2728 ml  Net  -1178 ml   Exam: General: A/O 4, NAD, No acute respiratory distress Eyes: Negative headache, eye pain, double vision, scotomas, floaters, negative retinal hemorrhage ENT: Negative Runny nose, negative ear pain, negative tinnitus, negative gingival bleeding,  Neck:  Negative scars, masses, torticollis, lymphadenopathy, JVD Lungs: Clear to auscultation bilaterally without wheezes or crackles Cardiovascular: Regular rate and rhythm without murmur gallop or rub normal S1 and S2 Abdomen:negative abdominal pain, negative dysphagia, Nontender, nondistended, soft, bowel sounds positive, no rebound, no ascites, no appreciable mass Extremities: No significant cyanosis, clubbing. Bilateral lower extremity edemaLt >> Rt 2+; AV fistula with good thrill on the right antecubital. Psychiatric:  Negative depression, negative anxiety, negative fatigue, negative mania currently very calm,  states no desire to  commit suicide/homicide. Neurologic:  Cranial nerves II through XII intact, tongue/uvula midline, all extremities muscle strength 5/5, sensation intact throughout, , negative dysarthria, negative expressive aphasia, negative receptive aphasia.   Data Reviewed: Basic Metabolic Panel:  Recent Labs Lab 01/26/15 1615 01/27/15 0300 01/28/15 1430 01/30/15 0800 01/31/15 1515  NA 134* 136 133* 135 133*  K 4.4 4.1 4.5 4.1 4.2  CL 107 108 103 100* 102  CO2 16* 19* 20* 27 27  GLUCOSE 156* 119* 189* 115* 216*  BUN 63* 67* 49* 22* 35*  CREATININE 5.96* 6.31* 5.55* 3.87* 5.04*  CALCIUM 8.8* 8.5* 8.4* 8.1* 8.5*  PHOS  --   --  5.0*  --  3.5   Liver Function Tests:  Recent Labs Lab 01/26/15 1615 01/27/15 0300 01/28/15 1430 01/31/15 1515  AST 40 25  --   --   ALT 51 40  --   --   ALKPHOS 225* 188*  --   --   BILITOT 1.6* 1.1  --   --   PROT 6.9 6.3*  --   --   ALBUMIN 2.5* 2.2* 2.3* 2.3*   CBC:  Recent Labs Lab 01/28/15 1430 01/29/15 1613 01/30/15 0800 01/31/15 1515 02/01/15 1230  WBC 9.5 9.5 11.4* 13.3* 14.1*  HGB 8.0* 7.8* 7.8* 7.4* 7.8*  HCT 24.7* 24.2* 24.8* 23.4* 25.2*  MCV 91.8 93.1 95.4 96.3 98.4  PLT 409* 355 361 358 324   Cardiac Enzymes:  Recent Labs Lab 01/26/15 2335 01/27/15 0300 01/27/15 1011  CKTOTAL 137  --   --   CKMB 5.2*  --   --   TROPONINI 0.07* 0.06* 0.05*   BNP (last 3 results)  Recent Labs  09/14/14 1228 11/29/14 1025 01/26/15 1615  BNP 174.3* 1981.6* 2258.1*    ProBNP (last 3 results)  Recent Labs  08/04/14 1007  PROBNP 52226.0*    CBG:  Recent Labs Lab 01/31/15 1857 01/31/15 2025 02/01/15 0732 02/01/15 1108 02/01/15 1628  GLUCAP 185* 190* 114* 203* 161*    Recent Results (from the past 240 hour(s))  MRSA PCR Screening     Status: None   Collection Time: 01/26/15  9:01 PM  Result Value Ref Range Status   MRSA by PCR NEGATIVE NEGATIVE Final    Comment:        The GeneXpert MRSA Assay  (FDA approved for NASAL specimens only), is one component of a comprehensive MRSA colonization surveillance program. It is not intended to diagnose MRSA infection nor to guide or monitor treatment for MRSA infections.      Studies:  Recent x-ray studies have been reviewed in detail by the Attending Physician  Scheduled Meds:  Scheduled Meds: . aspirin  325 mg Oral Daily  . atorvastatin  80 mg Oral q1800  . calcitRIOL  0.25 mcg Oral Daily  . calcium acetate  1,334 mg Oral TID WC  . carvedilol  25 mg Oral BID WC  . darbepoetin (ARANESP) injection - DIALYSIS  150 mcg Intravenous Q Fri-HD  . famotidine  20 mg Oral Daily  . [START ON 02/02/2015] FLUoxetine  20 mg Oral Daily  . heparin  5,000 Units Subcutaneous 3 times per day  . insulin aspart  0-5 Units Subcutaneous QHS  . insulin aspart  0-9 Units Subcutaneous TID WC  . montelukast  10 mg Oral QHS    Time spent on care of this patient: 35 mins  [name] A, MD [facility] For Consults/Admissions - Flow Manager - [phone] Office  [phone]  Contact MD directly via text page:      [email or web address]      password [facility]  02/01/2015, 6:12 PM   LOS: 6 days

## 2015-02-01 NOTE — Progress Notes (Signed)
Patient ID: [name], male   DOB: 22-Dec-1960, 54 y.o.   MRN: [id number]  [facility] Progress Note   Assessment/ Plan:   1. Volume overload/anasarca/chest pain: No evidence of ACS, improving with dialysis 2.ESRD: new start hemodialysis in a patient with progressive chronic kidney disease from underlying hypertension/DM/PVD/polysubstance abuse. Adjusting to hemodialysis as we await outpatient dialysis unit placement-likely at [facility]. 3. Anemia: No overt losses, hemoglobin remains low-adjusting Aranesp therapy. Getting intravenous iron for iron deficiency. 4. CKD-MBD: On calcium acetate for phosphorus binding, continue calcitriol PTH suppression 5. Nutrition: Low albumin as a result of his chronic kidney disease/was doubly poor dietary choices-anticipated to improve with dialysis/improvement of nutritional status, reevaluate need for nutritional supplements 6. Hypertension: Blood pressure is fairly controlled on monotherapy with carvedilol-monitor with ultrafiltration 7. M spike on SPEP: Previously scheduled for bone marrow biopsy today however, he informs me that this will now be done as an outpatient  Subjective:   Reports to be feeling fair-still fatigued in adjusting to dialysis    Objective:   BP 153/82 mmHg  Pulse 75  Temp(Src) 98.6 F (37 C) (Oral)  Resp 17  Ht 5' 11"  (1.803 m)  Wt 83.643 kg (184 lb 6.4 oz)  BMI 25.73 kg/m2  SpO2 97%  Physical Exam: Gen: Appears to be comfortable ambulating around room CVS: Pulse regular in rate and rhythm Resp: Fine rales left base otherwise clear to auscultation Abd: Soft, obese, nontender Ext: Trace lower extremity edema  Labs: BMET  Recent Labs Lab 01/26/15 1615 01/27/15 0300 01/28/15 1430 01/30/15 0800 01/31/15 1515  NA 134* 136 133* 135 133*  K 4.4 4.1 4.5 4.1 4.2  CL 107 108 103 100* 102  CO2 16* 19* 20* 27 27  GLUCOSE 156* 119* 189* 115* 216*  BUN 63* 67* 49* 22* 35*   CREATININE 5.96* 6.31* 5.55* 3.87* 5.04*  CALCIUM 8.8* 8.5* 8.4* 8.1* 8.5*  PHOS  --   --  5.0*  --  3.5   CBC  Recent Labs Lab 01/28/15 1430 01/29/15 1613 01/30/15 0800 01/31/15 1515  WBC 9.5 9.5 11.4* 13.3*  HGB 8.0* 7.8* 7.8* 7.4*  HCT 24.7* 24.2* 24.8* 23.4*  MCV 91.8 93.1 95.4 96.3  PLT 409* 355 361 358   Medications:    . aspirin  325 mg Oral Daily  . atorvastatin  80 mg Oral q1800  . calcitRIOL  0.25 mcg Oral Daily  . calcium acetate  1,334 mg Oral TID WC  . carvedilol  25 mg Oral BID WC  . darbepoetin (ARANESP) injection - DIALYSIS  150 mcg Intravenous Q Fri-HD  . famotidine  20 mg Oral Daily  . ferric gluconate (FERRLECIT/NULECIT) IV  125 mg Intravenous Daily  . FLUoxetine  10 mg Oral Daily  . heparin  5,000 Units Subcutaneous 3 times per day  . insulin aspart  0-5 Units Subcutaneous QHS  . insulin aspart  0-9 Units Subcutaneous TID WC  . montelukast  10 mg Oral QHS   Elmarie Shiley, MD 02/01/2015, 8:52 AM

## 2015-02-01 NOTE — Consult Note (Signed)
Psychiatry Consult follow-up  Reason for Consult:  Depression, substance abuse and suicide threats Referring Physician:  Dr. Thereasa Solo Patient Identification: [name] MRN:  [id number] Principal Diagnosis: Psychoactive substance-induced mood disorder Diagnosis:   Patient Active Problem List   Diagnosis Date Noted  . Monoclonal gammopathies [D47.2]   . Cocaine use [F14.10]   . Hematemesis with nausea [K92.0, R11.0]   . Psychoactive substance-induced mood disorder [F19.94, F06.30] 01/28/2015  . Substance abuse [F19.10]   . Suicidal ideation [R45.851]   . Acute diastolic CHF (congestive heart failure) [I50.31]   . Elevated troponin [R79.89]   . End-stage renal disease on hemodialysis [N18.6, Z99.2]   . CHF exacerbation [I50.9] 01/26/2015  . CHF (congestive heart failure) [I50.9] 01/26/2015  . IgA monoclonal gammopathy [D47.2] 12/28/2014  . GERD (gastroesophageal reflux disease) [K21.9] 11/08/2014  . Healthcare maintenance [Z00.00] 11/08/2014  . CKD (chronic kidney disease), stage IV [N18.4] 09/08/2013  . Chronic diastolic HF (heart failure), NYHA class 4 [I50.32] 09/06/2013  . DM (diabetes mellitus), type 2, uncontrolled, with renal complications [V42.59, D63.87] 09/06/2013  . Hypertension [I10] 08/27/2011  . Anemia of chronic renal failure, stage 4 (severe) [N18.4, D63.1] 08/27/2011    Total Time spent with patient: 30 minutes  Subjective:   [name] is a 54 y.o. male patient admitted with cocaine intoxication.  HPI: [name] is a 54 years old male admitted to [facility]. for shortness of breath swelling of his legs and sharp radiating pain to his neck associated with nausea and vomiting. Patient reported he was made a statement about suicide when he was intoxicated but he does not mean to participate. Patient also reported he has been smoking cocaine 1-2 times a week for the last 4-8 weeks and planning to quit. 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 Patient feels he is not to provide to his family any longer and his fiance has been working hard in providing for the home which makes him depressed sad and then self-medicating with drug of abuse which is cocaine. Patient also reported he occasionally drinks but not abusing or dependent on alcohol. Patient is willing to follow up outpatient psychiatric services and constant services also willing to take medication for depression. Patient contract for safety and denies current suicidal/homicidal ideation, intention or plan. Patient has no evidence of psychosis. Patient Denies Current chest pain, shortness of breath, dizziness, nausea, vomiting and diarrhea. UDS is positive for opiates and cocaine. Past psychiatric history patient denied history of mental health treatment either inpatient or outpatient. Patient has no history of substance abuse treatment. Social history: he lives with his fiance who works and provides for the home. Patient has a 2 young children and wanted to be there for them to see grew up and support them.   Interval history: Patient seen today for psychiatric consultation follow-up. Patient appeared awake, alert, oriented to time place person and situation. Patient has no new complaints today. Patient has been compliant with his medication management and reportedly feeling much better since he has been taking medication and not abusing any drugs. Patient denies current suicidal or homicidal ideation intention or plan. Patient has no evidence of psychotic symptoms. Patient is hoping to be discharged to home by tomorrow. Patient contract for safety during this hospitalization. Past Medical History:  Past Medical History  Diagnosis Date  . PVD  (peripheral vascular disease)   . Shortness of breath dyspnea   . Constipation   . Anemia     unsure  . CHF (congestive heart failure) 2015  . Diastolic congestive heart failure   . Diabetes mellitus 2015    type 2  . GERD (gastroesophageal reflux disease)     unsure  . Hypertension 2015  . Chronic renal disease, stage IV 2015  . Cocaine abuse   . Cellulitis and abscess of foot 08/24/2011  . Protein calorie malnutrition     Past Surgical History  Procedure Laterality Date  . Leg surgery    . Knee surgery Right   . Amputation  09/06/2011    Procedure: AMPUTATION RAY;  Surgeon: Wylene Simmer, MD;  Location: [facility];  Service: Orthopedics;  Laterality: Left;  Left Hallux Amputation  . Av fistula placement Left 08/12/2014    Procedure: CREATION OF A BRACHIOCEPHALIC ARTERIOVENOUS (AV) FISTULA  LEFT ARM;  Surgeon: Mal Misty, MD;  Location: [facility];  Service: Vascular;  Laterality: Left;  . Av fistula placement Right 10/07/2014    Procedure: Creation Right Arm Arteriovenous fistula;  Surgeon: Mal Misty, MD;  Location: [facility];  Service: Vascular;  Laterality: Right;  . Appendectomy     Family History:  Family History  Problem Relation Age of Onset  . Adopted: Yes  . Varicose Veins Mother   . Varicose Veins Sister    Social History:  History  Alcohol Use No     History  Drug Use No    Comment: Pt reports last use of cocaine was "last month"   As of 10/06/14  last was 2 months ago    History   Social History  . Marital Status: Legally Separated    Spouse Name: N/A  . Number of Children: N/A  . Years of Education: N/A   Social History Main Topics  . Smoking status: Never Smoker   . Smokeless tobacco: Never Used  . Alcohol Use: No  . Drug Use: No     Comment: Pt reports last use of cocaine was "last month"   As of 10/06/14  last was 2 months ago  . Sexual Activity: Not on file   Other Topics Concern  . None   Social History Narrative   Additional Social History:  patient lives at home with fianc. Patient used to live in [place] came to [place] about 6 years ago related to job transfer.                            Allergies:  No Known Allergies  Labs:  Results for orders placed or performed during the hospital encounter of 01/26/15 (from the past 48 hour(s))  IgG, IgA, IgM     Status: Abnormal   Collection Time: 01/30/15 11:35 AM  Result Value Ref Range   IgG (Immunoglobin G), Serum 903 700 - 1600 mg/dL   IgA 1082 (H) 90 - 386 mg/dL   IgM, Serum 28 20 - 172 mg/dL    Comment: (NOTE) Performed At: [facility] [address] [place], [place] [id number] Lindon Romp MD HE:[id number]   Glucose, capillary     Status: Abnormal   Collection Time: 01/30/15 11:47 AM  Result Value Ref Range   Glucose-Capillary 151 (H) 65 - 99 mg/dL   Comment 1 Notify RN    Comment 2  Document in Chart   Glucose, capillary     Status: Abnormal   Collection Time: 01/30/15  4:38 PM  Result Value Ref Range   Glucose-Capillary 153 (H) 65 - 99 mg/dL   Comment 1 Document in Chart   Glucose, capillary     Status: Abnormal   Collection Time: 01/30/15 10:33 PM  Result Value Ref Range   Glucose-Capillary 248 (H) 65 - 99 mg/dL  Glucose, capillary     Status: Abnormal   Collection Time: 01/31/15  7:40 AM  Result Value Ref Range   Glucose-Capillary 163 (H) 65 - 99 mg/dL  Glucose, capillary     Status: Abnormal   Collection Time: 01/31/15 11:12 AM  Result Value Ref Range   Glucose-Capillary 184 (H) 65 - 99 mg/dL  Renal function panel     Status: Abnormal   Collection Time: 01/31/15  3:15 PM  Result Value Ref Range   Sodium 133 (L) 135 - 145 mmol/L   Potassium 4.2 3.5 - 5.1 mmol/L   Chloride 102 101 - 111 mmol/L   CO2 27 22 - 32 mmol/L   Glucose, Bld 216 (H) 65 - 99 mg/dL   BUN 35 (H) 6 - 20 mg/dL   Creatinine, Ser 5.04 (H) 0.61 - 1.24 mg/dL   Calcium 8.5 (L) 8.9 - 10.3 mg/dL   Phosphorus 3.5 2.5 - 4.6 mg/dL   Albumin 2.3 (L) 3.5 - 5.0  g/dL   GFR calc non Af Amer 12 (L) >60 mL/min   GFR calc Af Amer 14 (L) >60 mL/min    Comment: (NOTE) The eGFR has been calculated using the CKD EPI equation. This calculation has not been validated in all clinical situations. eGFR's persistently <60 mL/min signify possible Chronic Kidney Disease.    Anion gap 4 (L) 5 - 15  CBC     Status: Abnormal   Collection Time: 01/31/15  3:15 PM  Result Value Ref Range   WBC 13.3 (H) 4.0 - 10.5 K/uL   RBC 2.43 (L) 4.22 - 5.81 MIL/uL   Hemoglobin 7.4 (L) 13.0 - 17.0 g/dL   HCT 23.4 (L) 39.0 - 52.0 %   MCV 96.3 78.0 - 100.0 fL   MCH 30.5 26.0 - 34.0 pg   MCHC 31.6 30.0 - 36.0 g/dL   RDW 16.3 (H) 11.5 - 15.5 %   Platelets 358 150 - 400 K/uL  Glucose, capillary     Status: Abnormal   Collection Time: 01/31/15  6:57 PM  Result Value Ref Range   Glucose-Capillary 185 (H) 65 - 99 mg/dL  Glucose, capillary     Status: Abnormal   Collection Time: 01/31/15  8:25 PM  Result Value Ref Range   Glucose-Capillary 190 (H) 65 - 99 mg/dL  Glucose, capillary     Status: Abnormal   Collection Time: 02/01/15  7:32 AM  Result Value Ref Range   Glucose-Capillary 114 (H) 65 - 99 mg/dL    Vitals: Blood pressure 153/82, pulse 75, temperature 98.6 F (37 C), temperature source Oral, resp. rate 17, height 5' 11"  (1.803 m), weight 83.643 kg (184 lb 6.4 oz), SpO2 97 %.  Risk to Self: Is patient at risk for suicide?: Yes Risk to Others:   Prior Inpatient Therapy:   Prior Outpatient Therapy:    Current Facility-Administered Medications  Medication Dose Route Frequency Provider Last Rate Last Dose  . acetaminophen-codeine (TYLENOL #3) 300-30 MG per tablet 1-2 tablet  1-2 tablet Oral Q6H PRN Jani Gravel, MD   2  tablet at 01/31/15 2127  . aspirin tablet 325 mg  325 mg Oral Daily Rhetta Mura Schorr, NP   325 mg at 01/31/15 1018  . atorvastatin (LIPITOR) tablet 80 mg  80 mg Oral q1800 Jani Gravel, MD   80 mg at 01/31/15 2124  . calcitRIOL (ROCALTROL) capsule 0.25 mcg   0.25 mcg Oral Daily Jani Gravel, MD   0.25 mcg at 01/31/15 1017  . calcium acetate (PHOSLO) capsule 1,334 mg  1,334 mg Oral TID WC Jani Gravel, MD   1,334 mg at 01/31/15 1223  . carvedilol (COREG) tablet 25 mg  25 mg Oral BID WC Jani Gravel, MD   25 mg at 01/31/15 2124  . Darbepoetin Alfa (ARANESP) injection 150 mcg  150 mcg Intravenous Q Fri-HD [name], MD   150 mcg at 01/28/15 1456  . famotidine (PEPCID) tablet 20 mg  20 mg Oral Daily Verlee Monte, MD   20 mg at 01/31/15 1018  . FLUoxetine (PROZAC) capsule 10 mg  10 mg Oral Daily Ambrose Finland, MD   10 mg at 01/31/15 1018  . heparin injection 5,000 Units  5,000 Units Subcutaneous 3 times per day Jani Gravel, MD   5,000 Units at 02/01/15 0536  . insulin aspart (novoLOG) injection 0-5 Units  0-5 Units Subcutaneous QHS Jani Gravel, MD   3 Units at 01/30/15 2241  . insulin aspart (novoLOG) injection 0-9 Units  0-9 Units Subcutaneous TID WC Jani Gravel, MD   2 Units at 01/31/15 1700  . montelukast (SINGULAIR) tablet 10 mg  10 mg Oral QHS Jani Gravel, MD   10 mg at 01/31/15 2124  . ondansetron (ZOFRAN) injection 4 mg  4 mg Intravenous Q6H PRN Dianne Dun, NP   4 mg at 01/31/15 1610    Musculoskeletal: Strength & Muscle Tone: decreased Gait & Station: unable to stand Patient leans: N/A  Psychiatric Specialty Exam: Physical Exam   ROS   Blood pressure 153/82, pulse 75, temperature 98.6 F (37 C), temperature source Oral, resp. rate 17, height 5' 11"  (1.803 m), weight 83.643 kg (184 lb 6.4 oz), SpO2 97 %.Body mass index is 25.73 kg/(m^2).  General Appearance: Casual  Eye Contact::  Good  Speech:  Clear and Coherent  Volume:  Normal  Mood:  Depressed  Affect:  Appropriate and Congruent  Thought Process:  Coherent and Goal Directed  Orientation:  Full (Time, Place, and Person)  Thought Content:  WDL  Suicidal Thoughts:  No  Homicidal Thoughts:  No  Memory:  Immediate;   Good Recent;   Good  Judgement:  Intact  Insight:   Fair  Psychomotor Activity:  Decreased  Concentration:  Good  Recall:  Good  Fund of Knowledge:Good  Language: Good  Akathisia:  Negative  Handed:  Right  AIMS (if indicated):     Assets:  Communication Skills Desire for Improvement Housing Intimacy Leisure Time Resilience Social Support  ADL's:  Impaired  Cognition: WNL  Sleep:      Medical Decision Making: New problem, with additional work up planned, Review of Psycho-Social Stressors (1), Review or order clinical lab tests (1), Established Problem, Worsening (2), Review of Last Therapy Session (1), Review of Medication Regimen & Side Effects (2) and Review of New Medication or Change in Dosage (2)  Treatment Plan Summary: Patient reportedly feeling better and improved his sadness, disturbance of sleep and appetite, low self-esteem since he was not able to provide for his family and also has self-medicating with illicit drugs cocaine which  causing more depression, irritability and possible suicidal thoughts. Patient denies suicidal thoughts when he becomes sober. 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  Cocaine intoxication: Counseling to stay sober   Substance-induced mood disorder: Increase fluoxetine 20 mg daily  Patient does not meet criteria for psychiatric inpatient admission. Supportive therapy provided about ongoing stressors.   Patient will be referred to the psychiatric social service regarding psychosocial history and contacting the family members for collateral Information.  Disposition: patient will be referred to the outpatient psychiatric medication management and counseling services for substance abuse, depression and anxiety when medically stable.    Avaley Coop,JANARDHAHA R. 02/01/2015 10:16 AM

## 2015-02-02 ENCOUNTER — Inpatient Hospital Stay (HOSPITAL_COMMUNITY): Payer: Medicaid Other

## 2015-02-02 DIAGNOSIS — I5032 Chronic diastolic (congestive) heart failure: Secondary | ICD-10-CM

## 2015-02-02 DIAGNOSIS — E1121 Type 2 diabetes mellitus with diabetic nephropathy: Secondary | ICD-10-CM

## 2015-02-02 LAB — KAPPA/LAMBDA LIGHT CHAINS
KAPPA FREE LGHT CHN: 517.82 mg/L — AB (ref 3.30–19.40)
KAPPA, LAMDA LIGHT CHAIN RATIO: 7.08 — AB (ref 0.26–1.65)
LAMDA FREE LIGHT CHAINS: 73.09 mg/L — AB (ref 5.71–26.30)

## 2015-02-02 LAB — IMMUNOFIXATION ELECTROPHORESIS
IGM, SERUM: 27 mg/dL (ref 20–172)
IgA: 1106 mg/dL — ABNORMAL HIGH (ref 90–386)
IgG (Immunoglobin G), Serum: 882 mg/dL (ref 700–1600)
Total Protein ELP: 5.9 g/dL — ABNORMAL LOW (ref 6.0–8.5)

## 2015-02-02 LAB — GLUCOSE, CAPILLARY
GLUCOSE-CAPILLARY: 111 mg/dL — AB (ref 65–99)
GLUCOSE-CAPILLARY: 193 mg/dL — AB (ref 65–99)
GLUCOSE-CAPILLARY: 143 mg/dL — AB (ref 65–99)
Glucose-Capillary: 198 mg/dL — ABNORMAL HIGH (ref 65–99)
Glucose-Capillary: 212 mg/dL — ABNORMAL HIGH (ref 65–99)

## 2015-02-02 LAB — BONE MARROW EXAM

## 2015-02-02 MED ORDER — MIDAZOLAM HCL 2 MG/2ML IJ SOLN
INTRAMUSCULAR | Status: AC | PRN
  Administered 2015-02-02 (×2): 1 mg via INTRAVENOUS

## 2015-02-02 MED ORDER — LIDOCAINE HCL 1 % IJ SOLN
INTRAMUSCULAR | Status: AC
  Filled 2015-02-02: qty 20

## 2015-02-02 MED ORDER — FENTANYL CITRATE (PF) 100 MCG/2ML IJ SOLN
INTRAMUSCULAR | Status: AC | PRN
  Administered 2015-02-02 (×2): 50 ug via INTRAVENOUS

## 2015-02-02 MED ORDER — MIDAZOLAM HCL 2 MG/2ML IJ SOLN
INTRAMUSCULAR | Status: AC
  Filled 2015-02-02: qty 2

## 2015-02-02 MED ORDER — SODIUM CHLORIDE 0.9 % IV SOLN
INTRAVENOUS | Status: AC | PRN
  Administered 2015-02-02: 10 mL/h via INTRAVENOUS

## 2015-02-02 MED ORDER — FENTANYL CITRATE (PF) 100 MCG/2ML IJ SOLN
INTRAMUSCULAR | Status: AC
  Filled 2015-02-02: qty 2

## 2015-02-02 NOTE — Sedation Documentation (Signed)
Patient denies pain and is resting comfortably.  

## 2015-02-02 NOTE — Progress Notes (Signed)
02/02/2015 7:41 AM  Late entry: Patient is high fall risk. Patient refused to be put on the bed alarm. Patient educated about risks. Patient verbalized understanding. Bed in lowest position.  [name] I, RN  [facility] 

## 2015-02-02 NOTE — Progress Notes (Signed)
02/02/2015 3:37 PM Hemodialysis Outpatient Note; this patient has been accepted at the [facility] on a Tuesday, Thursday and Saturday 2nd shift schedule.The center can begin treatment on Saturday June 4th at 12 noon. The patient must visit the center on Friday around 2:30 PM to sign paperwork and consents in order to begin on Saturday. Thank you. Gordy Savers

## 2015-02-02 NOTE — Progress Notes (Signed)
Patient ID: [name], male   DOB: December 05, 1960, 54 y.o.   MRN: [id number] [facility] PROGRESS NOTE  Mouhamad Teed FBX:[id number] DOB: May 24, 1961 DOA: 01/26/2015 PCP: Minerva Ends, MD  Brief narrative:    85 -year-old male with past medical history of substance abuse (cocaine and opiates), chronic diastolic CHF, diabetes mellitus who presented to [facility] with worsening shortness of breath and lower extremity swelling for a few days prior to this admission. Patient also reported substernal chest pain, sharp and radiating to the neck started prior to the admission and associated nausea with vomiting. The 12-lead EKG on admission showed normal sinus rhythm and in no acute ischemic changes. Chest x-ray did not reveal acute cardiopulmonary findings. His urine drug screen was positive for cocaine and opiates.   Patient's hospital course is complicated with finding of positive SPEP with IgA monoclonal myopathy. Hematology has recommended bone marrow biopsy which was done 02/02/2015. Additionally he is undergoing hemodialysis per renal schedule.    Assessment/Plan:     ESRD on hemodialysis - Per renal schedule. Appreciate renal following. Patient has not been established with outpatient hemodialysis yet. - Continue calcitriol, PhosLo  Monoclonal gammopathy - Positive SPEP with IgA monoclonal gammopathy concerning for possible MM - Dr. Lindi Adie of oncology recommended BM biopsy which was done today 6/1; will follow up on results  Substance abuse - UDS positive for cocaine and opiates   History of suicidal ideation / depression - Patient currently has no homicidal or suicidal ideation. - Stable. - Continue Prozac daily  Chronic diastolic CHF - BNP on this admission 2258 which is likely elevated from ESRD - HD per renal schedule - Respiratory status stable - Continue aspirin, carvedilol  Elevated troponin - Likely secondary to end-stage renal disease  and demand ischemia as well as cocaine abuse. - Chest pain has spontaneously resolved. - No acute ischemic changes seen on 12-lead EKG  Diabetes mellitus with renal manifestations, controlled - A1c is 5.8 from March 2016 indicating good glycemic control - Continue sliding scale insulin  Anemia of chronic disease - Likely anemia of chronic kidney disease - Hemoglobin is stable at 7.8. - Aranesp injection to be given on Friday with hemodialysis  Dyslipidemia - Continue statin therapy    DVT Prophylaxis  - SCD's bilaterally    Code Status: Full.  Family Communication:  plan of care discussed with the patient Disposition Plan: Home likely in next 1-2 days   IV access:  Peripheral IV  Procedures and diagnostic studies:    Dg Bone Survey Met 01/29/2015   Possible small lytic lesion in the anterior C5 vertebral body, equivocal.  Otherwise, no lytic lesions are evident in the visualized axial or appendicular skeleton.   Electronically Signed   By: Julian Hy M.D.   On: 01/29/2015 21:06   CT guided biopsy 02/02/2015   Medical Consultants:  Nephrology Psychiatry Interventional radiology   Other Consultants:  Nutrition   IAnti-Infectives:   None    Leisa Lenz, MD  [facility] Pager [phone]  Time spent in minutes: 25 minutes  If 7PM-7AM, please contact night-coverage [email or web address] Password [email or web address] 02/02/2015, 11:12 AM   LOS: 7 days    HPI/Subjective: No acute overnight events. Patient reports no shortness of breath.   Objective: Filed Vitals:   02/02/15 0933 02/02/15 0946 02/02/15 1034 02/02/15 1100  BP: 129/73 136/77 128/66 120/75  Pulse: 69 71 73 74  Temp: 98.5 F (36.9 C) 97.7 F (36.5 C) 98.2 F (36.8 C) 97.9 F (  36.6 C)  TempSrc: Oral Oral Oral Oral  Resp: 11 13 14 12   Height:      Weight:      SpO2: 97% 99% 91% 94%    Intake/Output Summary (Last 24 hours) at 02/02/15 1112 Last data filed at 02/02/15 0600  Gross per 24 hour  Intake    1140 ml  Output      0 ml  Net   1140 ml    Exam:   General:  Pt is alert,  not in acute distress  Cardiovascular: Regular rate and rhythm, S1/S2 (+)  Respiratory: Clear to auscultation bilaterally, no wheezing, no crackles, no rhonchi  Abdomen: Soft, non tender, non distended, bowel sounds present  Extremities: +1 LE pitting edema, pulses DP and PT palpable bilaterally  Neuro: Grossly nonfocal  Data Reviewed: Basic Metabolic Panel:  Recent Labs Lab 01/26/15 1615 01/27/15 0300 01/28/15 1430 01/30/15 0800 01/31/15 1515  NA 134* 136 133* 135 133*  K 4.4 4.1 4.5 4.1 4.2  CL 107 108 103 100* 102  CO2 16* 19* 20* 27 27  GLUCOSE 156* 119* 189* 115* 216*  BUN 63* 67* 49* 22* 35*  CREATININE 5.96* 6.31* 5.55* 3.87* 5.04*  CALCIUM 8.8* 8.5* 8.4* 8.1* 8.5*  PHOS  --   --  5.0*  --  3.5   Liver Function Tests:  Recent Labs Lab 01/26/15 1615 01/27/15 0300 01/28/15 1430 01/31/15 1515  AST 40 25  --   --   ALT 51 40  --   --   ALKPHOS 225* 188*  --   --   BILITOT 1.6* 1.1  --   --   PROT 6.9 6.3*  --   --   ALBUMIN 2.5* 2.2* 2.3* 2.3*   No results for input(s): LIPASE, AMYLASE in the last 168 hours. No results for input(s): AMMONIA in the last 168 hours. CBC:  Recent Labs Lab 01/28/15 1430 01/29/15 1613 01/30/15 0800 01/31/15 1515 02/01/15 1230  WBC 9.5 9.5 11.4* 13.3* 14.1*  HGB 8.0* 7.8* 7.8* 7.4* 7.8*  HCT 24.7* 24.2* 24.8* 23.4* 25.2*  MCV 91.8 93.1 95.4 96.3 98.4  PLT 409* 355 361 358 324   Cardiac Enzymes:  Recent Labs Lab 01/26/15 2335 01/27/15 0300 01/27/15 1011  CKTOTAL 137  --   --   CKMB 5.2*  --   --   TROPONINI 0.07* 0.06* 0.05*   BNP: Invalid input(s): POCBNP CBG:  Recent Labs Lab 02/01/15 0732 02/01/15 1108 02/01/15 1628 02/01/15 2100 02/02/15 1016  GLUCAP 114* 203* 161* 170* 111*    Recent Results (from the past 240 hour(s))  MRSA PCR Screening     Status: None   Collection Time: 01/26/15  9:01 PM  Result Value Ref  Range Status   MRSA by PCR NEGATIVE NEGATIVE Final     Scheduled Meds: . aspirin  325 mg Oral Daily  . atorvastatin  80 mg Oral q1800  . calcitRIOL  0.25 mcg Oral Daily  . calcium acetate  1,334 mg Oral TID WC  . carvedilol  25 mg Oral BID WC  . darbepoetin (ARANESP) injection - DIALYSIS  150 mcg Intravenous Q Fri-HD  . famotidine  20 mg Oral Daily  . fentaNYL      . FLUoxetine  20 mg Oral Daily  . heparin  5,000 Units Subcutaneous 3 times per day  . insulin aspart  0-5 Units Subcutaneous QHS  . insulin aspart  0-9 Units Subcutaneous TID WC  . lidocaine      .  midazolam      . montelukast  10 mg Oral QHS

## 2015-02-02 NOTE — Progress Notes (Signed)
Patient ID: [name], male   DOB: October 17, 1960, 54 y.o.   MRN: [id number]  [facility] Progress Note   Assessment/ Plan:   1. Volume overload/anasarca/chest pain: No evidence of ACS, improving with dialysis 2.ESRD: new start hemodialysis in a patient with progressive chronic kidney disease from underlying hypertension/DM/PVD/polysubstance abuse. Hemodialysis ordered for today as we await the decision regarding his outpatient dialysis unit placement 3. Anemia: No overt losses, hemoglobin remains low-adjusting Aranesp therapy. Getting intravenous iron for deficiency. 4. CKD-MBD: On calcium acetate for phosphorus binding, continue calcitriol PTH suppression 5. Nutrition: Low albumin as a result of his chronic kidney disease/was doubly poor dietary choices-anticipated to improve with dialysis/improvement of nutritional status, reevaluate need for nutritional supplements 6. Hypertension: Blood pressure is fairly controlled on monotherapy with carvedilol-monitor with ultrafiltration 7. M spike on SPEP: Bone marrow biopsy done earlier today-follow-up as outpatient with hematology  Subjective:   Reports to be feeling fair-somewhat drowsy after sedation for bone marrow biopsy    Objective:   BP 129/68 mmHg  Pulse 70  Temp(Src) 98 F (36.7 C) (Oral)  Resp 12  Ht 5' 11"  (1.803 m)  Wt 85.866 kg (189 lb 4.8 oz)  BMI 26.41 kg/m2  SpO2 97%  Physical Exam: Gen: Appears to be somnolent resting in bed CVS: Pulse regular in rate and rhythm, S1 and S2 normal Resp: Decreased breath sounds over bases otherwise clear Abd: Soft, obese, nontender Ext: 1-2+ lower extremity edema  Labs: BMET  Recent Labs Lab 01/26/15 1615 01/27/15 0300 01/28/15 1430 01/30/15 0800 01/31/15 1515  NA 134* 136 133* 135 133*  K 4.4 4.1 4.5 4.1 4.2  CL 107 108 103 100* 102  CO2 16* 19* 20* 27 27  GLUCOSE 156* 119* 189* 115* 216*  BUN 63* 67* 49* 22* 35*  CREATININE 5.96* 6.31* 5.55* 3.87*  5.04*  CALCIUM 8.8* 8.5* 8.4* 8.1* 8.5*  PHOS  --   --  5.0*  --  3.5   CBC  Recent Labs Lab 01/29/15 1613 01/30/15 0800 01/31/15 1515 02/01/15 1230  WBC 9.5 11.4* 13.3* 14.1*  HGB 7.8* 7.8* 7.4* 7.8*  HCT 24.2* 24.8* 23.4* 25.2*  MCV 93.1 95.4 96.3 98.4  PLT 355 361 358 324   Medications:    . aspirin  325 mg Oral Daily  . atorvastatin  80 mg Oral q1800  . calcitRIOL  0.25 mcg Oral Daily  . calcium acetate  1,334 mg Oral TID WC  . carvedilol  25 mg Oral BID WC  . darbepoetin (ARANESP) injection - DIALYSIS  150 mcg Intravenous Q Fri-HD  . famotidine  20 mg Oral Daily  . fentaNYL      . FLUoxetine  20 mg Oral Daily  . heparin  5,000 Units Subcutaneous 3 times per day  . insulin aspart  0-5 Units Subcutaneous QHS  . insulin aspart  0-9 Units Subcutaneous TID WC  . lidocaine      . midazolam      . montelukast  10 mg Oral QHS   Elmarie Shiley, MD 02/02/2015, 9:17 AM

## 2015-02-02 NOTE — Procedures (Signed)
BM Bx R iliac 11 g core No comp

## 2015-02-03 LAB — CBC
HCT: 25 % — ABNORMAL LOW (ref 39.0–52.0)
Hemoglobin: 8 g/dL — ABNORMAL LOW (ref 13.0–17.0)
MCH: 31.5 pg (ref 26.0–34.0)
MCHC: 32 g/dL (ref 30.0–36.0)
MCV: 98.4 fL (ref 78.0–100.0)
Platelets: 345 10*3/uL (ref 150–400)
RBC: 2.54 MIL/uL — ABNORMAL LOW (ref 4.22–5.81)
RDW: 17.7 % — AB (ref 11.5–15.5)
WBC: 12.8 10*3/uL — AB (ref 4.0–10.5)

## 2015-02-03 LAB — RENAL FUNCTION PANEL
ANION GAP: 12 (ref 5–15)
Albumin: 2.4 g/dL — ABNORMAL LOW (ref 3.5–5.0)
BUN: 48 mg/dL — AB (ref 6–20)
CO2: 24 mmol/L (ref 22–32)
CREATININE: 5.7 mg/dL — AB (ref 0.61–1.24)
Calcium: 8.8 mg/dL — ABNORMAL LOW (ref 8.9–10.3)
Chloride: 95 mmol/L — ABNORMAL LOW (ref 101–111)
GFR calc non Af Amer: 10 mL/min — ABNORMAL LOW (ref >60)
GFR, EST AFRICAN AMERICAN: 12 mL/min — AB (ref >60)
GLUCOSE: 174 mg/dL — AB (ref 65–99)
PHOSPHORUS: 4.2 mg/dL (ref 2.5–4.6)
POTASSIUM: 4.5 mmol/L (ref 3.5–5.1)
Sodium: 131 mmol/L — ABNORMAL LOW (ref 135–145)

## 2015-02-03 LAB — GLUCOSE, CAPILLARY
GLUCOSE-CAPILLARY: 161 mg/dL — AB (ref 65–99)
Glucose-Capillary: 107 mg/dL — ABNORMAL HIGH (ref 65–99)

## 2015-02-03 MED ORDER — ATORVASTATIN CALCIUM 80 MG PO TABS: 80.0000 mg | ORAL_TABLET | Freq: Every day | ORAL | Status: DC

## 2015-02-03 MED ORDER — HEPARIN SODIUM (PORCINE) 1000 UNIT/ML DIALYSIS
4000.0000 [IU] | INTRAMUSCULAR | Status: DC | PRN
  Filled 2015-02-03: qty 4

## 2015-02-03 MED ORDER — ASPIRIN 325 MG PO TABS: 325.0000 mg | ORAL_TABLET | Freq: Every day | ORAL | Status: DC

## 2015-02-03 MED ORDER — FLUOXETINE HCL 20 MG PO CAPS: 20.0000 mg | ORAL_CAPSULE | Freq: Every day | ORAL | Status: DC

## 2015-02-03 MED ORDER — CALCITRIOL 0.5 MCG PO CAPS
0.5000 ug | ORAL_CAPSULE | ORAL | Status: DC
  Filled 2015-02-03: qty 1

## 2015-02-03 NOTE — Care Management Note (Addendum)
Case Management Note  Patient Details  Name: [name] MRN: [id number] Date of Birth: 19-Jan-1961  Subjective/Objective:                 CM following for progression and d/c planning.   Action/Plan:  02/03/2015   No HH needs or DME needs identified. CSW notified of possible d/c need for transportation.     Expected Discharge Date:        02/03/2015          Expected Discharge Plan:  Home/Self Care  In-House Referral:  Clinical Social Work  Discharge planning Services  NA  Post Acute Care Choice:  NA Choice offered to:  NA  DME Arranged:    DME Agency:     HH Arranged:    HH Agency:     Status of Service:  Completed, signed off  Medicare Important Message Given:  No Date Medicare IM Given:    Medicare IM give by:    Date Additional Medicare IM Given:    Additional Medicare Important Message give by:     If discussed at [place] of Stay Meetings, dates discussed:    Additional Comments:  Adron Bene, RN 02/03/2015, 11:54 AM

## 2015-02-03 NOTE — Progress Notes (Signed)
Patient discharged home per MD. Discharge instructions provided along with scripts. Cane delivered from [facility]. NSL removed with cath intact. Patient preference to walk out on his own without wheelchair escort. Bartholomew Crews, RN

## 2015-02-03 NOTE — Progress Notes (Signed)
Patient ID: [name], male   DOB: 1960/10/10, 54 y.o.   MRN: [id number]  [facility] Progress Note   Assessment/ Plan:   1. Volume overload/anasarca/chest pain: Evaluated for acute coronary syndrome-no evidence of acute events-symptoms improving with dialysis 2.ESRD: New start hemodialysis-completed his treatment early this morning that was ordered for yesterday. He will continue hemodialysis on a Tuesday/Thursday/Saturday schedule at [facility]. Appears stable for discharge home from a renal standpoint 3. Anemia: No overt losses, hemoglobin remains low-adjusting Aranesp therapy. Getting intravenous iron for deficiency. 4. CKD-MBD: On calcium acetate for phosphorus binding, continue calcitriol PTH suppression 5. Nutrition: Low albumin as a result of his chronic kidney disease/was doubly poor dietary choices-anticipated to improve with dialysis/improvement of nutritional status, reevaluate need for nutritional supplements 6. Hypertension: Blood pressure is fairly controlled on monotherapy with carvedilol-monitor with ultrafiltration 7. M spike on SPEP: Bone marrow biopsy done earlier yesterday-follow-up as outpatient with hematology  Subjective:   Reports to be feeling well-denies any chest pain or shortness of breath. Tired because he just came back from dialysis earlier this morning.    Objective:   BP 162/80 mmHg  Pulse 75  Temp(Src) 98.6 F (37 C) (Oral)  Resp 16  Ht 5' 11"  (1.803 m)  Wt 84.4 kg (186 lb 1.1 oz)  BMI 25.96 kg/m2  SpO2 98%  Physical Exam: Gen: Comfortably resting in bed CVS: Pulse regular in rate and rhythm, S1 and S2 normal Resp: Clear to auscultation, no rales Abd: Soft, flat, nontender Ext: 1-2+ lower extremity edema  Labs: BMET  Recent Labs Lab 01/28/15 1430 01/30/15 0800 01/31/15 1515 02/03/15 0410  NA 133* 135 133* 131*  K 4.5 4.1 4.2 4.5  CL 103 100* 102 95*  CO2 20* 27 27 24   GLUCOSE 189* 115* 216* 174*   BUN 49* 22* 35* 48*  CREATININE 5.55* 3.87* 5.04* 5.70*  CALCIUM 8.4* 8.1* 8.5* 8.8*  PHOS 5.0*  --  3.5 4.2   CBC  Recent Labs Lab 01/30/15 0800 01/31/15 1515 02/01/15 1230 02/03/15 0410  WBC 11.4* 13.3* 14.1* 12.8*  HGB 7.8* 7.4* 7.8* 8.0*  HCT 24.8* 23.4* 25.2* 25.0*  MCV 95.4 96.3 98.4 98.4  PLT 361 358 324 345   Medications:    . aspirin  325 mg Oral Daily  . atorvastatin  80 mg Oral q1800  . calcitRIOL  0.25 mcg Oral Daily  . calcium acetate  1,334 mg Oral TID WC  . carvedilol  25 mg Oral BID WC  . darbepoetin (ARANESP) injection - DIALYSIS  150 mcg Intravenous Q Fri-HD  . famotidine  20 mg Oral Daily  . FLUoxetine  20 mg Oral Daily  . heparin  5,000 Units Subcutaneous 3 times per day  . insulin aspart  0-5 Units Subcutaneous QHS  . insulin aspart  0-9 Units Subcutaneous TID WC  . montelukast  10 mg Oral QHS   Elmarie Shiley, MD 02/03/2015, 11:17 AM

## 2015-02-03 NOTE — Discharge Summary (Signed)
Physician Discharge Summary  Deontae Robson NFA:[id number] DOB: [birth year] DOA: 01/26/2015  PCP: Minerva Ends, MD  Admit date: 01/26/2015 Discharge date: 02/03/2015  Recommendations for Outpatient Follow-up:  1. Patient will follow-up with nephrology per scheduled appointment. 2. Hemodialysis outpatient to be set up by nephrology. 3. Patient will have to follow-up with hematology in regards to final biopsy results.  Discharge Diagnoses:  Principal Problem:   Psychoactive substance-induced mood disorder Active Problems:   Hypertension   Anemia of chronic renal failure, stage 4 (severe)   DM (diabetes mellitus), type 2, uncontrolled, with renal complications   CHF exacerbation   CHF (congestive heart failure)   Substance abuse   Suicidal ideation   Acute diastolic CHF (congestive heart failure)   Elevated troponin   End-stage renal disease on hemodialysis   Monoclonal gammopathies   Cocaine use   Hematemesis with nausea    Discharge Condition: stable   Diet recommendation: as tolerated   History of present illness:  54 -year-old male with past medical history of substance abuse (cocaine and opiates), chronic diastolic CHF, diabetes mellitus who presented to [facility] with worsening shortness of breath and lower extremity swelling for a few days prior to this admission. Patient also reported substernal chest pain, sharp and radiating to the neck started prior to the admission and associated nausea with vomiting. The 12-lead EKG on admission showed normal sinus rhythm and in no acute ischemic changes. Chest x-ray did not reveal acute cardiopulmonary findings. His urine drug screen was positive for cocaine and opiates.   Patient's hospital course is complicated with finding of positive SPEP with IgA monoclonal myopathy. Hematology has recommended bone marrow biopsy which was done 02/02/2015. Additionally he is undergoing hemodialysis per renal schedule.     Hospital Course:   Assessment/Plan:    Principal problem: ESRD on hemodialysis - Per renal schedule. Appreciate renal following. Patient will be established with outpatient hemodialysis per nephrology. - Continue calcitriol, PhosLo  Active problems: Monoclonal gammopathy - Positive SPEP with IgA monoclonal gammopathy concerning for possible MM - Bone marrow biopsy done during this hospital stay. Patient will follow-up with hematology outpatient, appointment scheduled for 02/25/2015.  Substance abuse - UDS positive for cocaine and opiates  - Counseled on cessation.  History of suicidal ideation / depression - Currently not depressed. Continue Prozac daily.  Chronic diastolic CHF - BNP on this admission 2258 which is likely elevated from ESRD - Improving with hemodialysis. - Continue aspirin and carvedilol on discharge. - Per renal recommendations, patient will be on Lasix 160 mg 3 times daily.  Elevated troponin - Likely secondary to combination of demand ischemia from cocaine abuse and end-stage renal disease - No reports of chest pain this morning. - The 12-lead EKG at the time of the admission did not show acute ischemic changes  Diabetes mellitus with renal manifestations, controlled - A1c is 5.8 from March 2016 indicating good glycemic control - Continue sliding scale insulin  Anemia of chronic disease - Secondary to anemia of chronic kidney disease. - Hemoglobin stable. - Aranesp injection given with hemodialysis  Dyslipidemia - Continue statin therapy    DVT Prophylaxis  - SCD's bilaterally    Code Status: Full.  Family Communication: plan of care discussed with the patient   IV access:  Peripheral IV  Procedures and diagnostic studies:   Dg Bone Survey Met 01/29/2015 Possible small lytic lesion in the anterior C5 vertebral body, equivocal. Otherwise, no lytic lesions are evident in the visualized axial or  appendicular skeleton.  Electronically Signed By: Julian Hy M.D. On: 01/29/2015 21:06   CT guided biopsy 02/02/2015   Medical Consultants:  Nephrology Psychiatry Interventional radiology   Other Consultants:  Nutrition   IAnti-Infectives:   None      Signed:  Leisa Lenz, MD  [facility] 02/03/2015, 1:15 PM  Pager #: [phone]  Time spent in minutes: less than 30 minutes   Discharge Exam: Filed Vitals:   02/03/15 0815  BP: 162/80  Pulse: 75  Temp: 98.6 F (37 C)  Resp: 16   Filed Vitals:   02/03/15 0630 02/03/15 0638 02/03/15 0645 02/03/15 0815  BP: 169/107 171/96 144/68 162/80  Pulse: 79 75 78 75  Temp:  97.8 F (36.6 C)  98.6 F (37 C)  TempSrc:  Oral  Oral  Resp: 16 15 16 16   Height:      Weight:  84.4 kg (186 lb 1.1 oz)    SpO2:    98%    General: Pt is alert, follows commands appropriately, not in acute distress Cardiovascular: Regular rate and rhythm, S1/S2 appreciated  Respiratory: Clear to auscultation bilaterally, no wheezing, no crackles, no rhonchi Abdominal: Soft, non tender, non distended, bowel sounds +, no guarding Extremities: no edema, no cyanosis, pulses palpable bilaterally DP and PT Neuro: Grossly nonfocal  Discharge Instructions  Discharge Instructions    Call MD for:  difficulty breathing, headache or visual disturbances    Complete by:  As directed      Call MD for:  hives    Complete by:  As directed      Call MD for:  persistant nausea and vomiting    Complete by:  As directed      Call MD for:  severe uncontrolled pain    Complete by:  As directed      Diet - low sodium heart healthy    Complete by:  As directed      Increase activity slowly    Complete by:  As directed             Medication List    TAKE these medications        acetaminophen-codeine 300-30 MG per tablet  Commonly known as:  TYLENOL #3  Take 1-2 tablets by mouth every 6 (six) hours as needed for moderate pain.     aspirin 325 MG  tablet  Take 1 tablet (325 mg total) by mouth daily.     atorvastatin 80 MG tablet  Commonly known as:  LIPITOR  Take 1 tablet (80 mg total) by mouth daily at 6 PM.     calcitRIOL 0.25 MCG capsule  Commonly known as:  ROCALTROL  Take 1 capsule (0.25 mcg total) by mouth daily.     calcium acetate 667 MG capsule  Commonly known as:  PHOSLO  Take 2 capsules (1,334 mg total) by mouth 3 (three) times daily with meals.     carvedilol 25 MG tablet  Commonly known as:  COREG  Take 1 tablet (25 mg total) by mouth 2 (two) times daily with a meal.     chlorpheniramine-HYDROcodone 10-8 MG/5ML Lqcr  Commonly known as:  TUSSIONEX  Take 5 mLs by mouth every 12 (twelve) hours as needed for cough.     famotidine 40 MG tablet  Commonly known as:  PEPCID  Take 1 tablet (40 mg total) by mouth 2 (two) times daily.     FLUoxetine 20 MG capsule  Commonly known as:  PROZAC  Take  1 capsule (20 mg total) by mouth daily.     furosemide 80 MG tablet  Commonly known as:  LASIX  Take 2 tablets (160 mg total) by mouth 3 (three) times daily. Per renal     hydrALAZINE 100 MG tablet  Commonly known as:  APRESOLINE  Take 1 tablet (100 mg total) by mouth 3 (three) times daily.     insulin aspart 100 UNIT/ML injection  Commonly known as:  NOVOLOG  Before each meal 3 times a day, 140-199 - 2 units, 200-250 - 4 units, 251-299 - 6 units,  300-349 - 8 units,  350 or above 10 units. Insulin PEN if approved, provide syringes and needles if needed. Can switch to any short-acting insulin.     Insulin Syringe-Needle U-100 25G X 1" 1 ML Misc  Any brand, for 4 times a day insulin SQ, 1 month supply.     montelukast 10 MG tablet  Commonly known as:  SINGULAIR  Take 1 tablet (10 mg total) by mouth at bedtime.           Follow-up Information    Follow up with Minerva Ends, MD. Schedule an appointment as soon as possible for a visit in 1 week.   Specialty:  Family Medicine   Why:  Follow up appt after  recent hospitalization   Contact information:   [place] [place] [id number] [phone]        The results of significant diagnostics from this hospitalization (including imaging, microbiology, ancillary and laboratory) are listed below for reference.    Significant Diagnostic Studies: Dg Chest 2 View  01/26/2015   CLINICAL DATA:  One day history of shortness of breath  EXAM: CHEST  2 VIEW  COMPARISON:  November 29, 2014  FINDINGS: There is no appreciable edema or consolidation. Heart size and pulmonary vascularity are within normal limits. No adenopathy. There is mild degenerative change in the thoracic spine.  IMPRESSION: No edema or consolidation.   Electronically Signed   By: Lowella Grip III M.D.   On: 01/26/2015 16:54   Ct Biopsy  02/02/2015   CLINICAL DATA:  Monoclonal gammopathy  EXAM: CT-GUIDED BIOPSY BONE MARROW ASPIRATE AND CORE.  MEDICATIONS AND MEDICAL HISTORY: Versed 2 mg, Fentanyl 100 mcg.  Additional Medications: None.  ANESTHESIA/SEDATION: Moderate sedation time: 10 minutes  PROCEDURE: The procedure, risks, benefits, and alternatives were explained to the patient. Questions regarding the procedure were encouraged and answered. The patient understands and consents to the procedure.  The back was prepped with ChloraPrep in a sterile fashion, and a sterile drape was applied covering the operative field. A sterile gown and sterile gloves were used for the procedure.  Under CT guidance, an 11 gauge needle was inserted into the right iliac bone via posterior approach. Aspirates and a core were obtain. Final imaging was performed.  Patient tolerated the procedure well without complication. Vital sign monitoring by nursing staff during the procedure will continue as patient is in the special procedures unit for post procedure observation.  FINDINGS: The images document guide needle placement within the right iliac bone. Post biopsy images demonstrate no hemorrhage.   COMPLICATIONS: None  IMPRESSION: Successful CT-guided bone marrow aspirate and core from the right iliac bone.   Electronically Signed   By: Marybelle Killings M.D.   On: 02/02/2015 12:43   Dg Bone Survey Met  01/29/2015   CLINICAL DATA:  Monoclonal gammopathy  EXAM: METASTATIC BONE SURVEY  COMPARISON:  None.  FINDINGS:  No lytic lesions in the skull.  No lytic lesions in the bilateral upper extremities.  Degenerative changes of the cervical spine. Possible small lytic lesion in the anterior aspect of the C5 vertebral body, equivocal.  Mild degenerative changes of the thoracic spine. No lytic lesions. Visualized lungs are clear.  Mild degenerative changes of the lumbar spine. No lytic lesions in the visualized lumbar spine or pelvis. Mild degenerative changes of the bilateral hips.  No lytic lesions in the visualized bilateral lower extremities. Status post ORIF of the proximal right tibia.  IMPRESSION: Possible small lytic lesion in the anterior C5 vertebral body, equivocal.  Otherwise, no lytic lesions are evident in the visualized axial or appendicular skeleton.   Electronically Signed   By: Julian Hy M.D.   On: 01/29/2015 21:06    Microbiology: Recent Results (from the past 240 hour(s))  MRSA PCR Screening     Status: None   Collection Time: 01/26/15  9:01 PM  Result Value Ref Range Status   MRSA by PCR NEGATIVE NEGATIVE Final    Comment:        The GeneXpert MRSA Assay (FDA approved for NASAL specimens only), is one component of a comprehensive MRSA colonization surveillance program. It is not intended to diagnose MRSA infection nor to guide or monitor treatment for MRSA infections.      Labs: Basic Metabolic Panel:  Recent Labs Lab 01/28/15 1430 01/30/15 0800 01/31/15 1515 02/03/15 0410  NA 133* 135 133* 131*  K 4.5 4.1 4.2 4.5  CL 103 100* 102 95*  CO2 20* 27 27 24   GLUCOSE 189* 115* 216* 174*  BUN 49* 22* 35* 48*  CREATININE 5.55* 3.87* 5.04* 5.70*  CALCIUM 8.4* 8.1*  8.5* 8.8*  PHOS 5.0*  --  3.5 4.2   Liver Function Tests:  Recent Labs Lab 01/28/15 1430 01/31/15 1515 02/03/15 0410  ALBUMIN 2.3* 2.3* 2.4*   No results for input(s): LIPASE, AMYLASE in the last 168 hours. No results for input(s): AMMONIA in the last 168 hours. CBC:  Recent Labs Lab 01/29/15 1613 01/30/15 0800 01/31/15 1515 02/01/15 1230 02/03/15 0410  WBC 9.5 11.4* 13.3* 14.1* 12.8*  HGB 7.8* 7.8* 7.4* 7.8* 8.0*  HCT 24.2* 24.8* 23.4* 25.2* 25.0*  MCV 93.1 95.4 96.3 98.4 98.4  PLT 355 361 358 324 345   Cardiac Enzymes: No results for input(s): CKTOTAL, CKMB, CKMBINDEX, TROPONINI in the last 168 hours. BNP: BNP (last 3 results)  Recent Labs  09/14/14 1228 11/29/14 1025 01/26/15 1615  BNP 174.3* 1981.6* 2258.1*    ProBNP (last 3 results)  Recent Labs  08/04/14 1007  PROBNP 52226.0*    CBG:  Recent Labs Lab 02/02/15 1638 02/02/15 1709 02/02/15 2140 02/03/15 0754 02/03/15 1211  GLUCAP 193* 212* 143* 107* 161*

## 2015-02-03 NOTE — Discharge Instructions (Signed)
End-Stage Kidney Disease °The kidneys are two organs that lie on either side of the spine between the middle of the back and the front of the abdomen. The kidneys:  °· Remove wastes and extra water from the blood.   °· Produce important hormones. These help keep bones strong, regulate blood pressure, and help create red blood cells.   °· Balance the fluids and chemicals in the blood and tissues. °End-stage kidney disease occurs when the kidneys are so damaged that they cannot do their job. When the kidneys cannot do their job, life-threatening problems occur. The body cannot stay clean and strong without the help of the kidneys. In end-stage kidney disease, the kidneys cannot get better. You need a new kidney or treatments to do some of the work healthy kidneys do in order to stay alive. °CAUSES  °End-stage kidney disease usually occurs when a long-lasting (chronic) kidney disease gets worse. It may also occur after the kidneys are suddenly damaged (acute kidney injury).  °SYMPTOMS  °· Swelling (edema) of the legs, ankles, or feet.   °· Tiredness (lethargy).   °· Nausea or vomiting.   °· Confusion.   °· Problems with urination, such as:   °¨ Decreased urine production.   °¨ Frequent urination, especially at night.   °¨ Frequent accidents in children who are potty trained.   °· Muscle twitches and cramps.   °· Persistent itchiness.   °· Loss of appetite.   °· Headaches.   °· Abnormally dark or light skin.   °· Numbness in the hands or feet.   °· Easy bruising.   °· Frequent hiccups.   °· Menstruation stops. °DIAGNOSIS  °Your health care provider will measure your blood pressure and take some tests. These may include:  °· Urine tests.   °· Blood tests.   °· Imaging tests, such as:   °¨ An ultrasound exam.   °¨ Computed tomography (CT). °· A kidney biopsy. °TREATMENT  °There are two treatments for end-stage kidney disease:  °· A procedure that removes toxic wastes from the body (dialysis).   °· Receiving a new kidney  (kidney transplant). °Both of these treatments have serious risks and consequences. Your health care provider will help you determine which treatment is best for you based on your health, age, and other factors. In addition to having dialysis or a kidney transplant, you may need to take medicines to control high blood pressure (hypertension) and cholesterol and to decrease phosphorus levels in your blood.  °HOME CARE INSTRUCTIONS °· Follow your prescribed diet.   °· Take medicines only as directed by your health care provider.   °· Do not take any new medicines (prescription, over-the-counter, or nutritional supplements) unless approved by your health care provider. Many medicines can worsen your kidney damage or need to have the dose adjusted.   °· Keep all follow-up visits as directed by your health care provider. °MAKE SURE YOU: °· Understand these instructions. °· Will watch your condition. °· Will get help right away if you are not doing well or get worse. °Document Released: 11/10/2003 Document Revised: 01/04/2014 Document Reviewed: 04/18/2012 °Exit[employer]® Patient Information ©2015 [employer]. This information is not intended to replace advice given to you by your health care provider. Make sure you discuss any questions you have with your health care provider. ° °

## 2015-02-04 LAB — HEPATITIS B CORE ANTIBODY, TOTAL: Hep B Core Total Ab: NEGATIVE

## 2015-02-07 ENCOUNTER — Telehealth: Payer: Self-pay | Admitting: *Deleted

## 2015-02-07 NOTE — Telephone Encounter (Signed)
-----   Message from [name], MD sent at 02/04/2015  8:34 AM EDT ----- Hep B antibody negative

## 2015-02-08 ENCOUNTER — Ambulatory Visit: Payer: Medicaid Other | Attending: Family Medicine | Admitting: Family Medicine

## 2015-02-08 ENCOUNTER — Encounter (HOSPITAL_COMMUNITY): Payer: Self-pay | Admitting: Physical Medicine and Rehabilitation

## 2015-02-08 ENCOUNTER — Emergency Department (HOSPITAL_COMMUNITY)
Admission: EM | Admit: 2015-02-08 | Discharge: 2015-02-08 | Disposition: A | Discharge status: Other Acute Inpatient Hospital | Payer: Medicaid Other | Attending: Emergency Medicine | Admitting: Emergency Medicine

## 2015-02-08 ENCOUNTER — Encounter: Payer: Self-pay | Admitting: Family Medicine

## 2015-02-08 ENCOUNTER — Inpatient Hospital Stay
Admission: EM | Admit: 2015-02-08 | Discharge: 2015-02-11 | DRG: 885 | Disposition: A | Discharge status: Home / Self Care | Payer: Medicaid Other | Source: Other Acute Inpatient Hospital | Attending: Psychiatry | Admitting: Psychiatry

## 2015-02-08 ENCOUNTER — Telehealth: Payer: Self-pay | Admitting: Hematology and Oncology

## 2015-02-08 ENCOUNTER — Other Ambulatory Visit: Payer: Self-pay | Admitting: *Deleted

## 2015-02-08 ENCOUNTER — Emergency Department
Admission: EM | Admit: 2015-02-08 | Discharge: 2015-02-08 | Discharge status: Absent without leave | Payer: Medicaid Other | Attending: Psychiatry | Admitting: Psychiatry

## 2015-02-08 VITALS — BP 161/80 | HR 73 | Temp 98.3°F | Resp 16 | Ht 71.0 in | Wt 184.0 lb

## 2015-02-08 DIAGNOSIS — E119 Type 2 diabetes mellitus without complications: Secondary | ICD-10-CM | POA: Diagnosis not present

## 2015-02-08 DIAGNOSIS — Z872 Personal history of diseases of the skin and subcutaneous tissue: Secondary | ICD-10-CM | POA: Insufficient documentation

## 2015-02-08 DIAGNOSIS — Z79899 Other long term (current) drug therapy: Secondary | ICD-10-CM | POA: Insufficient documentation

## 2015-02-08 DIAGNOSIS — K219 Gastro-esophageal reflux disease without esophagitis: Secondary | ICD-10-CM | POA: Diagnosis present

## 2015-02-08 DIAGNOSIS — E1122 Type 2 diabetes mellitus with diabetic chronic kidney disease: Secondary | ICD-10-CM | POA: Diagnosis present

## 2015-02-08 DIAGNOSIS — I151 Hypertension secondary to other renal disorders: Secondary | ICD-10-CM | POA: Diagnosis not present

## 2015-02-08 DIAGNOSIS — Z992 Dependence on renal dialysis: Secondary | ICD-10-CM | POA: Insufficient documentation

## 2015-02-08 DIAGNOSIS — I5030 Unspecified diastolic (congestive) heart failure: Secondary | ICD-10-CM | POA: Insufficient documentation

## 2015-02-08 DIAGNOSIS — IMO0001 Reserved for inherently not codable concepts without codable children: Secondary | ICD-10-CM | POA: Diagnosis present

## 2015-02-08 DIAGNOSIS — I129 Hypertensive chronic kidney disease with stage 1 through stage 4 chronic kidney disease, or unspecified chronic kidney disease: Secondary | ICD-10-CM | POA: Insufficient documentation

## 2015-02-08 DIAGNOSIS — IMO0002 Reserved for concepts with insufficient information to code with codable children: Secondary | ICD-10-CM

## 2015-02-08 DIAGNOSIS — F1410 Cocaine abuse, uncomplicated: Secondary | ICD-10-CM | POA: Diagnosis present

## 2015-02-08 DIAGNOSIS — N184 Chronic kidney disease, stage 4 (severe): Secondary | ICD-10-CM | POA: Insufficient documentation

## 2015-02-08 DIAGNOSIS — E1165 Type 2 diabetes mellitus with hyperglycemia: Secondary | ICD-10-CM | POA: Diagnosis present

## 2015-02-08 DIAGNOSIS — Z7982 Long term (current) use of aspirin: Secondary | ICD-10-CM | POA: Insufficient documentation

## 2015-02-08 DIAGNOSIS — Z9889 Other specified postprocedural states: Secondary | ICD-10-CM | POA: Diagnosis not present

## 2015-02-08 DIAGNOSIS — N186 End stage renal disease: Secondary | ICD-10-CM | POA: Diagnosis present

## 2015-02-08 DIAGNOSIS — G4700 Insomnia, unspecified: Secondary | ICD-10-CM | POA: Diagnosis present

## 2015-02-08 DIAGNOSIS — I5032 Chronic diastolic (congestive) heart failure: Secondary | ICD-10-CM | POA: Diagnosis present

## 2015-02-08 DIAGNOSIS — I739 Peripheral vascular disease, unspecified: Secondary | ICD-10-CM | POA: Diagnosis present

## 2015-02-08 DIAGNOSIS — Z862 Personal history of diseases of the blood and blood-forming organs and certain disorders involving the immune mechanism: Secondary | ICD-10-CM | POA: Diagnosis not present

## 2015-02-08 DIAGNOSIS — F1020 Alcohol dependence, uncomplicated: Secondary | ICD-10-CM | POA: Diagnosis present

## 2015-02-08 DIAGNOSIS — N2889 Other specified disorders of kidney and ureter: Secondary | ICD-10-CM

## 2015-02-08 DIAGNOSIS — F32A Depression, unspecified: Secondary | ICD-10-CM

## 2015-02-08 DIAGNOSIS — D631 Anemia in chronic kidney disease: Secondary | ICD-10-CM | POA: Diagnosis present

## 2015-02-08 DIAGNOSIS — E785 Hyperlipidemia, unspecified: Secondary | ICD-10-CM | POA: Diagnosis present

## 2015-02-08 DIAGNOSIS — E1129 Type 2 diabetes mellitus with other diabetic kidney complication: Secondary | ICD-10-CM

## 2015-02-08 DIAGNOSIS — F329 Major depressive disorder, single episode, unspecified: Secondary | ICD-10-CM

## 2015-02-08 DIAGNOSIS — D472 Monoclonal gammopathy: Secondary | ICD-10-CM | POA: Diagnosis present

## 2015-02-08 DIAGNOSIS — Z9049 Acquired absence of other specified parts of digestive tract: Secondary | ICD-10-CM | POA: Diagnosis present

## 2015-02-08 DIAGNOSIS — R45851 Suicidal ideations: Secondary | ICD-10-CM | POA: Diagnosis present

## 2015-02-08 DIAGNOSIS — F1590 Other stimulant use, unspecified, uncomplicated: Secondary | ICD-10-CM

## 2015-02-08 DIAGNOSIS — Z794 Long term (current) use of insulin: Secondary | ICD-10-CM | POA: Diagnosis not present

## 2015-02-08 DIAGNOSIS — F321 Major depressive disorder, single episode, moderate: Secondary | ICD-10-CM | POA: Diagnosis not present

## 2015-02-08 DIAGNOSIS — Z8589 Personal history of malignant neoplasm of other organs and systems: Secondary | ICD-10-CM | POA: Diagnosis not present

## 2015-02-08 DIAGNOSIS — I120 Hypertensive chronic kidney disease with stage 5 chronic kidney disease or end stage renal disease: Secondary | ICD-10-CM | POA: Diagnosis present

## 2015-02-08 DIAGNOSIS — I10 Essential (primary) hypertension: Secondary | ICD-10-CM | POA: Diagnosis present

## 2015-02-08 LAB — COMPREHENSIVE METABOLIC PANEL
ALT: 34 U/L (ref 17–63)
AST: 37 U/L (ref 15–41)
Albumin: 3 g/dL — ABNORMAL LOW (ref 3.5–5.0)
Alkaline Phosphatase: 173 U/L — ABNORMAL HIGH (ref 38–126)
Anion gap: 11 (ref 5–15)
BUN: 40 mg/dL — ABNORMAL HIGH (ref 6–20)
CALCIUM: 9.4 mg/dL (ref 8.9–10.3)
CO2: 25 mmol/L (ref 22–32)
CREATININE: 5.9 mg/dL — AB (ref 0.61–1.24)
Chloride: 101 mmol/L (ref 101–111)
GFR calc Af Amer: 11 mL/min — ABNORMAL LOW (ref >60)
GFR calc non Af Amer: 10 mL/min — ABNORMAL LOW (ref >60)
Glucose, Bld: 139 mg/dL — ABNORMAL HIGH (ref 65–99)
Potassium: 4.8 mmol/L (ref 3.5–5.1)
Sodium: 137 mmol/L (ref 135–145)
Total Bilirubin: 0.7 mg/dL (ref 0.3–1.2)
Total Protein: 7.3 g/dL (ref 6.5–8.1)

## 2015-02-08 LAB — COMPLETE METABOLIC PANEL WITH GFR
ALT: 29 U/L (ref 0–53)
AST: 35 U/L (ref 0–37)
Albumin: 3.1 g/dL — ABNORMAL LOW (ref 3.5–5.2)
Alkaline Phosphatase: 176 U/L — ABNORMAL HIGH (ref 39–117)
BILIRUBIN TOTAL: 0.6 mg/dL (ref 0.2–1.2)
BUN: 42 mg/dL — ABNORMAL HIGH (ref 6–23)
CO2: 27 meq/L (ref 19–32)
Calcium: 9.6 mg/dL (ref 8.4–10.5)
Chloride: 102 mEq/L (ref 96–112)
Creat: 5.4 mg/dL — ABNORMAL HIGH (ref 0.50–1.35)
GFR, EST AFRICAN AMERICAN: 13 mL/min — AB
GFR, Est Non African American: 11 mL/min — ABNORMAL LOW
Glucose, Bld: 146 mg/dL — ABNORMAL HIGH (ref 70–99)
Potassium: 5.2 mEq/L (ref 3.5–5.3)
SODIUM: 139 meq/L (ref 135–145)
TOTAL PROTEIN: 6.8 g/dL (ref 6.0–8.3)

## 2015-02-08 LAB — CBC
HCT: 28.5 % — ABNORMAL LOW (ref 39.0–52.0)
HEMOGLOBIN: 9 g/dL — AB (ref 13.0–17.0)
MCH: 31 pg (ref 26.0–34.0)
MCHC: 31.6 g/dL (ref 30.0–36.0)
MCV: 98.3 fL (ref 78.0–100.0)
MPV: 9.2 fL (ref 8.6–12.4)
PLATELETS: 365 10*3/uL (ref 150–400)
RBC: 2.9 MIL/uL — ABNORMAL LOW (ref 4.22–5.81)
RDW: 17.8 % — ABNORMAL HIGH (ref 11.5–15.5)
WBC: 11.7 10*3/uL — ABNORMAL HIGH (ref 4.0–10.5)

## 2015-02-08 LAB — CBC WITH DIFFERENTIAL/PLATELET
Basophils Absolute: 0.1 10*3/uL (ref 0.0–0.1)
Basophils Relative: 1 % (ref 0–1)
EOS PCT: 4 % (ref 0–5)
Eosinophils Absolute: 0.5 10*3/uL (ref 0.0–0.7)
HCT: 28.9 % — ABNORMAL LOW (ref 39.0–52.0)
HEMOGLOBIN: 9.1 g/dL — AB (ref 13.0–17.0)
LYMPHS ABS: 1.3 10*3/uL (ref 0.7–4.0)
Lymphocytes Relative: 10 % — ABNORMAL LOW (ref 12–46)
MCH: 31.2 pg (ref 26.0–34.0)
MCHC: 31.5 g/dL (ref 30.0–36.0)
MCV: 99 fL (ref 78.0–100.0)
MONO ABS: 1.4 10*3/uL — AB (ref 0.1–1.0)
Monocytes Relative: 11 % (ref 3–12)
NEUTROS ABS: 9.1 10*3/uL — AB (ref 1.7–7.7)
Neutrophils Relative %: 74 % (ref 43–77)
Platelets: 333 10*3/uL (ref 150–400)
RBC: 2.92 MIL/uL — AB (ref 4.22–5.81)
RDW: 17.9 % — AB (ref 11.5–15.5)
WBC: 12.3 10*3/uL — ABNORMAL HIGH (ref 4.0–10.5)

## 2015-02-08 LAB — ETHANOL: Alcohol, Ethyl (B): <5 mg/dL (ref <5)

## 2015-02-08 LAB — RAPID URINE DRUG SCREEN, HOSP PERFORMED
Amphetamines: NOT DETECTED
BARBITURATES: NOT DETECTED
Benzodiazepines: NOT DETECTED
COCAINE: POSITIVE — AB
Opiates: POSITIVE — AB
Tetrahydrocannabinol: NOT DETECTED

## 2015-02-08 LAB — ACETAMINOPHEN LEVEL: Acetaminophen (Tylenol), Serum: <10 ug/mL — ABNORMAL LOW (ref 10–30)

## 2015-02-08 LAB — SALICYLATE LEVEL: Salicylate Lvl: <4 mg/dL (ref 2.8–30.0)

## 2015-02-08 MED ORDER — HEPARIN SODIUM (PORCINE) 1000 UNIT/ML DIALYSIS: 1000.0000 [IU] | INTRAMUSCULAR | Status: DC | PRN

## 2015-02-08 MED ORDER — HEPARIN SODIUM (PORCINE) 1000 UNIT/ML DIALYSIS: 20.0000 [IU]/kg | INTRAMUSCULAR | Status: DC | PRN

## 2015-02-08 MED ORDER — DARBEPOETIN ALFA 150 MCG/0.3ML IJ SOSY: 150.0000 ug | PREFILLED_SYRINGE | INTRAMUSCULAR | Status: DC

## 2015-02-08 MED ORDER — TRAZODONE HCL 50 MG PO TABS: 25.0000 mg | ORAL_TABLET | Freq: Every day | ORAL | Status: DC

## 2015-02-08 MED ORDER — LIDOCAINE HCL (PF) 1 % IJ SOLN: 5.0000 mL | INTRAMUSCULAR | Status: DC | PRN

## 2015-02-08 MED ORDER — SODIUM CHLORIDE 0.9 % IV SOLN: 100.0000 mL | INTRAVENOUS | Status: DC | PRN

## 2015-02-08 MED ORDER — ALTEPLASE 2 MG IJ SOLR
2.0000 mg | Freq: Once | INTRAMUSCULAR | Status: DC | PRN
  Filled 2015-02-08: qty 2

## 2015-02-08 MED ORDER — SODIUM CHLORIDE 0.9 % IV SOLN
62.5000 mg | INTRAVENOUS | Status: DC
  Administered 2015-02-08: 62.5 mg via INTRAVENOUS
  Filled 2015-02-08: qty 5

## 2015-02-08 MED ORDER — LIDOCAINE-PRILOCAINE 2.5-2.5 % EX CREA
1.0000 "application " | TOPICAL_CREAM | CUTANEOUS | Status: DC | PRN
  Filled 2015-02-08: qty 5

## 2015-02-08 MED ORDER — PENTAFLUOROPROP-TETRAFLUOROETH EX AERO: 1.0000 "application " | INHALATION_SPRAY | CUTANEOUS | Status: DC | PRN

## 2015-02-08 MED ORDER — CALCITRIOL 0.5 MCG PO CAPS
0.5000 ug | ORAL_CAPSULE | ORAL | Status: DC
  Filled 2015-02-08: qty 1

## 2015-02-08 MED ORDER — NEPRO/CARBSTEADY PO LIQD: 237.0000 mL | ORAL | Status: DC | PRN

## 2015-02-08 NOTE — Progress Notes (Signed)
F/U Dialysis Stated feeling depress need to talk to someone  No medication today due to Dialysis

## 2015-02-08 NOTE — ED Notes (Signed)
Pt presents to department for evaluation of suicidal ideation. [employer] at bedside. Pt reports he has plan to run into traffic. Reports he is very depressed and feels like dying. Pt denies HI. Pt is alert and oriented x4.

## 2015-02-08 NOTE — Progress Notes (Signed)
Pt from ED. Alert, vss, no c/o. Sitter present.

## 2015-02-08 NOTE — Patient Instructions (Signed)
[name],  Thank you for coming in today  I am sorry this is such a trying time for you.  1. Depression with poor sleep: Avoid cocaine and alcohol which will worsen your mood Prozac 20 mg daily Trazodone 25 mg nightly  2. Hypertension: Take coreg, lasix and hydralazine if you are not having dialysis today Checking potassium and CBC  3. Kidney disease on dialysis: Speak with the schedulers and social work at the dialysis center to determine a schedule that works well for you.  Also to check PPD  F/u with me in 4 weeks for depression and hypertension.   Dr. Adrian Blackwater

## 2015-02-08 NOTE — ED Notes (Signed)
Pt remains in dialysis

## 2015-02-08 NOTE — ED Provider Notes (Signed)
CSN: [id number]     Arrival date & time 02/08/15  1135 History   First MD Initiated Contact with Patient 02/08/15 1351     Chief Complaint  Patient presents with  . Suicidal     (Consider location/radiation/quality/duration/timing/severity/associated sxs/prior Treatment) HPI 54 year old male presents feeling depressed and with suicidal thoughts. These thoughts started last night. He has a vague plans such as walking into traffic, slitting his wrists, or some other way to kill himself but does not have a definitive plan our strategy. He has children he states that they are the only thing keeping him from killing himself. The patient has a history of chronic renal disease and just Started on dialysis one week ago. Last dialysis was 3 days ago (Saturday) and next scheduled day is today. Besides feeling depressed because of his medical issues as well as his relationship status he has no other concerning complaints. He denies chest pain, shortness of breath, or abdominal pain or vomiting. States he just wants help for his mental issues.  Past Medical History  Diagnosis Date  . PVD (peripheral vascular disease)   . Shortness of breath dyspnea   . Constipation   . Anemia     unsure  . CHF (congestive heart failure) 2015  . Diastolic congestive heart failure   . Diabetes mellitus 2015    type 2  . GERD (gastroesophageal reflux disease)     unsure  . Hypertension 2015  . Chronic renal disease, stage IV 2015  . Cocaine abuse   . Cellulitis and abscess of foot 08/24/2011  . Protein calorie malnutrition    Past Surgical History  Procedure Laterality Date  . Leg surgery    . Knee surgery Right   . Amputation  09/06/2011    Procedure: AMPUTATION RAY;  Surgeon: Wylene Simmer, MD;  Location: [place];  Service: Orthopedics;  Laterality: Left;  Left Hallux Amputation  . Av fistula placement Left 08/12/2014    Procedure: CREATION OF A BRACHIOCEPHALIC ARTERIOVENOUS (AV) FISTULA  LEFT ARM;  Surgeon: Mal Misty, MD;  Location: [place];  Service: Vascular;  Laterality: Left;  . Av fistula placement Right 10/07/2014    Procedure: Creation Right Arm Arteriovenous fistula;  Surgeon: Mal Misty, MD;  Location: [facility];  Service: Vascular;  Laterality: Right;  . Appendectomy     Family History  Problem Relation Age of Onset  . Adopted: Yes  . Varicose Veins Mother   . Varicose Veins Sister    History  Substance Use Topics  . Smoking status: Never Smoker   . Smokeless tobacco: Never Used  . Alcohol Use: No    Review of Systems  Respiratory: Negative for shortness of breath.   Cardiovascular: Negative for chest pain.  Gastrointestinal: Negative for vomiting and abdominal pain.  Psychiatric/Behavioral: Positive for suicidal ideas and dysphoric mood.  All other systems reviewed and are negative.     Allergies  Review of patient's allergies indicates no known allergies.  Home Medications   Prior to Admission medications   Medication Sig Start Date End Date Taking? Authorizing Provider  acetaminophen-codeine (TYLENOL #3) 300-30 MG per tablet Take 1-2 tablets by mouth every 6 (six) hours as needed for moderate pain. Patient not taking: Reported on 02/08/2015 01/04/15   Micheline Chapman, NP  aspirin 325 MG tablet Take 1 tablet (325 mg total) by mouth daily. 02/03/15   Robbie Lis, MD  atorvastatin (LIPITOR) 80 MG tablet Take 1 tablet (80 mg total) by  mouth daily at 6 PM. 02/03/15   Robbie Lis, MD  calcitRIOL (ROCALTROL) 0.25 MCG capsule Take 1 capsule (0.25 mcg total) by mouth daily. 11/05/14   [name], MD  calcium acetate (PHOSLO) 667 MG capsule Take 2 capsules (1,334 mg total) by mouth 3 (three) times daily with meals. 12/15/14   Renella Cunas, MD  carvedilol (COREG) 25 MG tablet Take 1 tablet (25 mg total) by mouth 2 (two) times daily with a meal. 09/15/14   Renella Cunas, MD  chlorpheniramine-HYDROcodone (TUSSIONEX) 10-8 MG/5ML LQCR Take 5 mLs by mouth every 12 (twelve) hours as  needed for cough. 12/01/14   Thurnell Lose, MD  famotidine (PEPCID) 40 MG tablet Take 1 tablet (40 mg total) by mouth 2 (two) times daily. 12/01/14   Thurnell Lose, MD  FLUoxetine (PROZAC) 20 MG capsule Take 1 capsule (20 mg total) by mouth daily. 02/03/15   Robbie Lis, MD  furosemide (LASIX) 80 MG tablet Take 2 tablets (160 mg total) by mouth 3 (three) times daily. Per renal Patient not taking: Reported on 02/08/2015 12/28/14   [name], MD  hydrALAZINE (APRESOLINE) 100 MG tablet Take 1 tablet (100 mg total) by mouth 3 (three) times daily. Patient not taking: Reported on 02/08/2015 12/07/14   [name], MD  insulin aspart (NOVOLOG) 100 UNIT/ML injection Before each meal 3 times a day, 140-199 - 2 units, 200-250 - 4 units, 251-299 - 6 units,  300-349 - 8 units,  350 or above 10 units. Insulin PEN if approved, provide syringes and needles if needed. Can switch to any short-acting insulin. Patient not taking: Reported on 01/04/2015 12/01/14   Thurnell Lose, MD  Insulin Syringe-Needle U-100 25G X 1" 1 ML MISC Any brand, for 4 times a day insulin SQ, 1 month supply. Patient not taking: Reported on 01/04/2015 12/01/14   Thurnell Lose, MD  montelukast (SINGULAIR) 10 MG tablet Take 1 tablet (10 mg total) by mouth at bedtime. 12/01/14   Thurnell Lose, MD  traZODone (DESYREL) 50 MG tablet Take 0.5 tablets (25 mg total) by mouth at bedtime. 02/08/15   Josalyn Funches, MD   BP 169/91 mmHg  Pulse 77  Temp(Src) 98.7 F (37.1 C) (Oral)  Resp 18  SpO2 99% Physical Exam  Constitutional: He is oriented to person, place, and time. He appears well-developed and well-nourished.  HENT:  Head: Normocephalic and atraumatic.  Right Ear: External ear normal.  Left Ear: External ear normal.  Nose: Nose normal.  Eyes: Right eye exhibits no discharge. Left eye exhibits no discharge.  Neck: Neck supple.  Cardiovascular: Normal rate, regular rhythm, normal heart sounds and intact distal pulses.    Pulmonary/Chest: Effort normal and breath sounds normal. He has no wheezes. He has no rales.  Abdominal: Soft. There is no tenderness.  Musculoskeletal: He exhibits no edema.  Right AV fistula with good thrill, no tenderness or swelling  Neurological: He is alert and oriented to person, place, and time.  Skin: Skin is warm and dry.  Nursing note and vitals reviewed.   ED Course  Procedures (including critical care time) Labs Review Labs Reviewed  CBC WITH DIFFERENTIAL/PLATELET - Abnormal; Notable for the following:    WBC 12.3 (*)    RBC 2.92 (*)    Hemoglobin 9.1 (*)    HCT 28.9 (*)    RDW 17.9 (*)    Neutro Abs 9.1 (*)    Lymphocytes Relative 10 (*)    Monocytes  Absolute 1.4 (*)    All other components within normal limits  COMPREHENSIVE METABOLIC PANEL - Abnormal; Notable for the following:    Glucose, Bld 139 (*)    BUN 40 (*)    Creatinine, Ser 5.90 (*)    Albumin 3.0 (*)    Alkaline Phosphatase 173 (*)    GFR calc non Af Amer 10 (*)    GFR calc Af Amer 11 (*)    All other components within normal limits  ACETAMINOPHEN LEVEL - Abnormal; Notable for the following:    Acetaminophen (Tylenol), Serum <10 (*)    All other components within normal limits  SALICYLATE LEVEL  URINE RAPID DRUG SCREEN (HOSP PERFORMED) NOT AT [facility]  ETHANOL    Imaging Review No results found.   EKG Interpretation None      MDM   Final diagnoses:  None    Patient is medically stable. No signs of fluid overload or indication for emergent dialysis. D/w Dr. Detterding who will figure out dialysis while he is in ER. Consult psych    Sherwood Gambler, MD 02/08/15 [phone]

## 2015-02-08 NOTE — Assessment & Plan Note (Addendum)
Depression with poor sleep: Avoid cocaine and alcohol which will worsen your mood Prozac 20 mg daily Trazodone 25 mg nightly   Internal referral to CSW made. Patient endorsed SI to CSW.EMS called. Patient was voluntarily escorted to [facility] by Garment/textile technologist.
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
## 2015-02-08 NOTE — ED Notes (Signed)
Pt back from diaysis

## 2015-02-08 NOTE — Assessment & Plan Note (Signed)
.   Hypertension: Take coreg, lasix and hydralazine if you are not having dialysis today Checking potassium and CBC

## 2015-02-08 NOTE — Progress Notes (Signed)
[name] Progress Note  Background:  54 year old HM discharged February 03, 2015 following an admission during which HD was initiated for volume overload/anasarca, progressive CKD now ESRD due to HTN/DM/polysubstance abuse complicated by history of depression and suicidal ideation.  Bone marrrow   He has had one HD treatment (6/4) at [facility] since discharge. His net UF was 4.3 and he got to his EDW of 81. Knows he is swollen but not SOB.  Too much going on at home with step daughter, wife.  Things building up on  Him and cannot be there. Not sure what he is going to do.   Feels he was pushed into dialysis too soon and that kidneys failed because of someone's error and reaction to medication.  Assessment/Plan: 1. Depression/ SI - not new, poor coping, new ESRD and now myeloma diagnosis not sure he knows; needs a lot of psychological support, 2. ESRD - TTS - HD today K 4.8 Vol xs. Fistula ok. Suspect 5-10 liters vol xs. 3. Anemia - Hgb  8 6/2 trending up to 9.1 tsat 21% 5/26 ferritin 316 - continue Aranesp 150 per week - given 6/4 - not due until Saturday venofer 50 per week 4. Secondary hyperparathyroidism - iPTH 177 prior to last admission - continue calcitriol 0.5 Ca 9.4 5. HTN/volume - titrate volume, lasix stopped last d/c - titrate hydralazine - was d/c on 100 tid, also on coreg 25 bid, Will come down with lower vol.   6. Nutrition - alb 3 7.  Mspike - had abnormal BM Bx 10 - 15% plasmacytosis - appt make for heme onc 6/24 Not sure he knows.  Myriam Jacobson, PA-C [place] [phone] 02/08/2015,2:45 PM I have seen and examined this patient and agree with the plan of care seen, examined, eval, to do HD and go back to ED .  Trixy Loyola L 02/08/2015, 3:26 PM     Subjective:     Objective Filed Vitals:   02/08/15 1149  BP: 169/91  Pulse: 77  Temp: 98.7 F (37.1 C)  TempSrc: Oral  Resp: 18  SpO2: 99%   Physical Exam General: anxious, dysjointed  thoughts. HEENT pharynx ok, fundi with art narrowing and AV nicking Heart: S4, Gr 2/6 SEM, LV lift,  PMI 13 cm lat to MSL, Lungs:  Decreased bs, rales in bases Abdomen: pos bs, soft. Liver down 5 cm Extremities: 2-3+ edema  Toe amp on L Dialysis Access: right AVF RUA, good B&T  Dialysis Orders: [facility] 4 hr 240/800 right AVF 17 gauge - tirtating up heparin tight 2 K 2 Ca Calcitriol 0.5 Aranesp 150 venofer 50 per week   Additional Objective Labs: Basic Metabolic Panel:  Recent Labs Lab 02/03/15 0410 02/08/15 1230  NA 131* 137  K 4.5 4.8  CL 95* 101  CO2 24 25  GLUCOSE 174* 139*  BUN 48* 40*  CREATININE 5.70* 5.90*  CALCIUM 8.8* 9.4  PHOS 4.2  --    Liver Function Tests:  Recent Labs Lab 02/03/15 0410 02/08/15 1230  AST  --  37  ALT  --  34  ALKPHOS  --  173*  BILITOT  --  0.7  PROT  --  7.3  ALBUMIN 2.4* 3.0*  CBC:  Recent Labs Lab 02/03/15 0410 02/08/15 1230  WBC 12.8* 12.3*  NEUTROABS  --  9.1*  HGB 8.0* 9.1*  HCT 25.0* 28.9*  MCV 98.4 99.0  PLT 345 333   Blood Culture  Component Value Date/Time   SDES URINE, CLEAN CATCH 09/06/2013 1527   SPECREQUEST NONE 09/06/2013 1527   CULT  09/06/2013 1527    INSIGNIFICANT GROWTH Performed at [facility] 09/07/2013 FINAL 09/06/2013 1527   CBG:  Recent Labs Lab 02/02/15 1638 02/02/15 1709 02/02/15 2140 02/03/15 0754 02/03/15 1211  GLUCAP 193* 212* 143* 107* 161*  Medications:

## 2015-02-08 NOTE — ED Notes (Signed)
Patient wanded by security. 
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
## 2015-02-08 NOTE — Procedures (Signed)
I was present at this session.  I have reviewed the session itself and made appropriate changes.  HD via RUA avf, vol xs and HTN  Paizlee Kinder L 6/7/20164:02 PM

## 2015-02-08 NOTE — ED Notes (Signed)
Pt in dialysis. Next shift sitter went to [place].

## 2015-02-08 NOTE — Progress Notes (Signed)
Pt completed HD treatment. Pt ended 15 minutes early due to extreme cramping. Report called to ED Charge RN and patient returned to ED with sitter.

## 2015-02-08 NOTE — Progress Notes (Signed)
Patient accepted at [facility] to Dr. Jerilee Hoh, bed 301. Call report at [phone].  RN at [facility] informed.  Verlon Setting, [place] Disposition staff 02/08/2015 11:06 PM

## 2015-02-08 NOTE — Assessment & Plan Note (Signed)
Kidney disease on dialysis: Speak with the schedulers and social work at the dialysis center to determine a schedule that works well for you.  Also to check PPD

## 2015-02-08 NOTE — Telephone Encounter (Signed)
Left message to confirmed appoitment 06/24. Mailed calendar.

## 2015-02-08 NOTE — ED Notes (Signed)
Pt changed into blue paper scrubs, security at bedside to wand, staffing notified for sitter.

## 2015-02-09 DIAGNOSIS — F321 Major depressive disorder, single episode, moderate: Secondary | ICD-10-CM

## 2015-02-09 DIAGNOSIS — F1590 Other stimulant use, unspecified, uncomplicated: Secondary | ICD-10-CM

## 2015-02-09 DIAGNOSIS — F1020 Alcohol dependence, uncomplicated: Secondary | ICD-10-CM

## 2015-02-09 LAB — GLUCOSE, CAPILLARY
GLUCOSE-CAPILLARY: 223 mg/dL — AB (ref 65–99)
Glucose-Capillary: 123 mg/dL — ABNORMAL HIGH (ref 65–99)
Glucose-Capillary: 87 mg/dL (ref 65–99)
Glucose-Capillary: 110 mg/dL — ABNORMAL HIGH (ref 65–99)

## 2015-02-09 LAB — HEMOGLOBIN A1C: Hgb A1c MFr Bld: 6 % (ref 4.0–6.0)

## 2015-02-09 MED ORDER — FUROSEMIDE 20 MG PO TABS
80.0000 mg | ORAL_TABLET | Freq: Two times a day (BID) | ORAL | Status: DC
  Administered 2015-02-09 – 2015-02-11 (×4): 80 mg via ORAL
  Filled 2015-02-09 (×4): qty 4

## 2015-02-09 MED ORDER — TRAZODONE HCL 50 MG PO TABS: 50.0000 mg | ORAL_TABLET | Freq: Every day | ORAL | Status: DC

## 2015-02-09 MED ORDER — CALCITRIOL 0.25 MCG PO CAPS
0.2500 ug | ORAL_CAPSULE | Freq: Every day | ORAL | Status: DC
  Administered 2015-02-09 – 2015-02-11 (×3): 0.25 ug via ORAL
  Filled 2015-02-09 (×6): qty 1

## 2015-02-09 MED ORDER — NEPRO/CARBSTEADY PO LIQD: 237.0000 mL | ORAL | Status: DC | PRN

## 2015-02-09 MED ORDER — HYDRALAZINE HCL 25 MG PO TABS
50.0000 mg | ORAL_TABLET | Freq: Three times a day (TID) | ORAL | Status: DC
  Administered 2015-02-09: 50 mg via ORAL
  Filled 2015-02-09: qty 2

## 2015-02-09 MED ORDER — INSULIN ASPART 100 UNIT/ML ~~LOC~~ SOLN
0.0000 [IU] | Freq: Every day | SUBCUTANEOUS | Status: DC
  Filled 2015-02-09: qty 1
  Filled 2015-02-09 (×2): qty 2

## 2015-02-09 MED ORDER — MIRTAZAPINE 15 MG PO TABS
15.0000 mg | ORAL_TABLET | Freq: Every day | ORAL | Status: DC
  Administered 2015-02-09 – 2015-02-10 (×2): 15 mg via ORAL
  Filled 2015-02-09 (×2): qty 1

## 2015-02-09 MED ORDER — NEPRO/CARBSTEADY PO LIQD
237.0000 mL | Freq: Every morning | ORAL | Status: DC
  Administered 2015-02-11: 237 mL via ORAL

## 2015-02-09 MED ORDER — MONTELUKAST SODIUM 10 MG PO TABS
10.0000 mg | ORAL_TABLET | Freq: Every day | ORAL | Status: DC
  Administered 2015-02-09 – 2015-02-10 (×2): 10 mg via ORAL
  Filled 2015-02-09 (×2): qty 1

## 2015-02-09 MED ORDER — MAGNESIUM HYDROXIDE 400 MG/5ML PO SUSP: 30.0000 mL | Freq: Every day | ORAL | Status: DC | PRN

## 2015-02-09 MED ORDER — CARVEDILOL 25 MG PO TABS
25.0000 mg | ORAL_TABLET | Freq: Two times a day (BID) | ORAL | Status: DC
  Administered 2015-02-09 – 2015-02-11 (×5): 25 mg via ORAL
  Filled 2015-02-09 (×8): qty 1

## 2015-02-09 MED ORDER — CALCIUM ACETATE (PHOS BINDER) 667 MG PO CAPS
1334.0000 mg | ORAL_CAPSULE | Freq: Three times a day (TID) | ORAL | Status: DC
  Administered 2015-02-09 (×2): 1334 mg via ORAL
  Administered 2015-02-09: 12:00:00 via ORAL
  Administered 2015-02-09 – 2015-02-11 (×5): 1334 mg via ORAL
  Filled 2015-02-09 (×7): qty 2

## 2015-02-09 MED ORDER — HYDRALAZINE HCL 50 MG PO TABS
100.0000 mg | ORAL_TABLET | Freq: Three times a day (TID) | ORAL | Status: DC
  Administered 2015-02-09 – 2015-02-11 (×5): 100 mg via ORAL
  Filled 2015-02-09 (×8): qty 2

## 2015-02-09 MED ORDER — HYDRALAZINE HCL 25 MG PO TABS: 25.0000 mg | ORAL_TABLET | Freq: Three times a day (TID) | ORAL | Status: DC | PRN

## 2015-02-09 MED ORDER — FAMOTIDINE 20 MG PO TABS
40.0000 mg | ORAL_TABLET | Freq: Two times a day (BID) | ORAL | Status: DC
  Administered 2015-02-09 – 2015-02-10 (×4): 40 mg via ORAL
  Filled 2015-02-09 (×4): qty 2

## 2015-02-09 MED ORDER — INSULIN ASPART 100 UNIT/ML ~~LOC~~ SOLN
4.0000 [IU] | Freq: Three times a day (TID) | SUBCUTANEOUS | Status: DC
  Administered 2015-02-09 – 2015-02-10 (×2): 4 [IU] via SUBCUTANEOUS
  Filled 2015-02-09: qty 4

## 2015-02-09 MED ORDER — ACETAMINOPHEN 325 MG PO TABS
650.0000 mg | ORAL_TABLET | Freq: Four times a day (QID) | ORAL | Status: DC | PRN
  Administered 2015-02-09: 650 mg via ORAL
  Filled 2015-02-09: qty 2

## 2015-02-09 MED ORDER — INSULIN ASPART 100 UNIT/ML ~~LOC~~ SOLN
0.0000 [IU] | Freq: Three times a day (TID) | SUBCUTANEOUS | Status: DC
  Administered 2015-02-09: 7 [IU] via SUBCUTANEOUS
  Filled 2015-02-09: qty 7

## 2015-02-09 MED ORDER — INSULIN ASPART 100 UNIT/ML ~~LOC~~ SOLN
0.0000 [IU] | Freq: Three times a day (TID) | SUBCUTANEOUS | Status: DC
  Administered 2015-02-10: 2 [IU] via SUBCUTANEOUS
  Administered 2015-02-11: 1 [IU] via SUBCUTANEOUS

## 2015-02-09 MED ORDER — ALUM & MAG HYDROXIDE-SIMETH 200-200-20 MG/5ML PO SUSP: 30.0000 mL | ORAL | Status: DC | PRN

## 2015-02-09 MED ORDER — ATORVASTATIN CALCIUM 20 MG PO TABS
80.0000 mg | ORAL_TABLET | Freq: Every day | ORAL | Status: DC
  Administered 2015-02-09 – 2015-02-10 (×2): 80 mg via ORAL
  Filled 2015-02-09: qty 1
  Filled 2015-02-09 (×2): qty 4

## 2015-02-09 MED ORDER — SERTRALINE HCL 50 MG PO TABS
50.0000 mg | ORAL_TABLET | Freq: Every day | ORAL | Status: DC
Start: 2015-02-09 — End: 2015-02-11
  Administered 2015-02-09 – 2015-02-10 (×2): 50 mg via ORAL
  Filled 2015-02-09 (×2): qty 1

## 2015-02-09 NOTE — Plan of Care (Signed)
Problem: Alteration in mood Goal: LTG-Patient reports reduction in suicidal thoughts (Patient reports reduction in suicidal thoughts and is able to verbalize a safety plan for whenever patient is feeling suicidal)  Outcome: Progressing Pt denies SI. Has not had any attempts to harm self or others.  Problem: Ineffective individual coping Goal: STG: Patient will remain free from self harm Outcome: Progressing Pt remains free of self harm.

## 2015-02-09 NOTE — Progress Notes (Signed)
Also noted on left forearm dime sized red raised area where he said he got a TB skin test recently.Will report to first shift and make sure MD aware of.

## 2015-02-09 NOTE — Progress Notes (Signed)
Recreation Therapy Notes  Date: 06.08.16 Time: 3:00 pm Location: Craft Room  Group Topic: Self-esteem  Goal Area(s) Addresses:  Patient will write down at least one positive trait about self. Patient will verbalize how it felt to see positive traits on paper.  Behavioral Response: Attentive, Interactive  Intervention: I Am  Activity: Patients were given a worksheet with the letter I on it and instructed to list as many positive things inside the letter as they can.  Education: LRT educated patient on ways to increase self-esteem   Education Outcome: Acknowledges education/In group clarification offered  Clinical Observations/Feedback: Patient completed approximately 40% of worksheet. Patient contributed to group discussion by stating how it felt to see positive traits, and that it was difficult to write positive traits.  Leonette Monarch, LRT/CTRS 02/09/2015 4:32 PM

## 2015-02-09 NOTE — Progress Notes (Signed)
D: Patient stated he is able to draw up insulin  A: Demonstrated dawning up 15 units  This was done correctly  Tapped for air  Reviewed sites on body  R: Positive strokes given " I told you I knew"
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
## 2015-02-09 NOTE — Progress Notes (Signed)
Recreation Therapy Notes  INPATIENT RECREATION THERAPY ASSESSMENT  Patient Details Name: [name] MRN: [id number] DOB: [birth year] Today's Date: 02/09/2015  Patient Stressors: Relationship, Other (Comment) (Dialysis for kidneys)  Coping Skills:   Isolate, Substance Abuse, Exercise, Art/Dance, Talking, Music, Sports, Other (Comment) (Thinking good thoughts, counting to 10)  Personal Challenges: Anger, Relationships  Leisure Interests (2+):  Individual - TV, Individual - Other (Comment) (Be with kids)  Awareness of Community Resources:  No  Community Resources:     Current Use:    If no, Barriers?:    Patient Strengths:  Responsible, care about family, provider  Patient Identified Areas of Improvement:  Be a better provider for family  Current Recreation Participation:  Being with his girls  Patient Goal for Hospitalization:  To seek help he needs to go back to family  [place] of Residence:  [place] of Residence:  [place]   Current [place] (including self-harm):  No  Current [place]:  No  Consent to Intern Participation: N/A   Leonette Monarch, LRT/CTRS 02/09/2015, 4:53 PM

## 2015-02-09 NOTE — [facility] Suicide Risk Assessment (Signed)
[facility] Admission Suicide Risk Assessment   Nursing information obtained from:  Patient Demographic factors:  Male Current Mental Status:  Belief that plan would result in death Loss Factors:  Decline in physical health Historical Factors:  NA Risk Reduction Factors:  Responsible for children under 54 years of age, Sense of responsibility to family, Religious beliefs about death, Positive coping skills or problem solving skills Total Time spent with patient: 1 hour Principal Problem: Major depressive disorder, single episode, moderate Diagnosis:   Patient Active Problem List   Diagnosis Date Noted  . Major depressive disorder, single episode, moderate [F32.1] 02/09/2015  . Stimulant use disorder (cocaine) [F15.99] 02/09/2015  . Alcohol use disorder, moderate, dependence [F10.20] 02/09/2015  . Acute diastolic CHF (congestive heart failure) [I50.31]   . IgA monoclonal gammopathy [D47.2] 12/28/2014  . GERD (gastroesophageal reflux disease) [K21.9] 11/08/2014  . CKD (chronic kidney disease), stage IV [N18.4] 09/08/2013  . DM (diabetes mellitus), type 2, uncontrolled, with renal complications [K12.24, S97.53] 09/06/2013  . Hypertension [I10] 08/27/2011  . Anemia of chronic renal failure, stage 4 (severe) [N18.4, D63.1] 08/27/2011     Continued Clinical Symptoms:  Alcohol Use Disorder Identification Test Final Score (AUDIT): 0 The "Alcohol Use Disorders Identification Test", Guidelines for Use in Primary Care, Second Edition.  World Pharmacologist [facility]). Score between 0-7:  no or low risk or alcohol related problems. Score between 8-15:  moderate risk of alcohol related problems. Score between 16-19:  high risk of alcohol related problems. Score 20 or above:  warrants further diagnostic evaluation for alcohol dependence and treatment.   CLINICAL FACTORS:   Depression:   Comorbid alcohol abuse/dependence Insomnia Alcohol/Substance Abuse/Dependencies Medical Diagnoses and  Treatments/Surgeries   Musculoskeletal: Strength & Muscle Tone: within normal limits Gait & Station: normal Patient leans: N/A  Psychiatric Specialty Exam: Physical Exam  ROS    COGNITIVE FEATURES THAT CONTRIBUTE TO RISK:  None    SUICIDE RISK:   Moderate:  Frequent suicidal ideation with limited intensity, and duration, some specificity in terms of plans, no associated intent, good self-control, limited dysphoria/symptomatology, some risk factors present, and identifiable protective factors, including available and accessible social support.  PLAN OF CARE: Admit to behavioral health  Medical Decision Making:  Established Problem, Worsening (2)  I certify that inpatient services furnished can reasonably be expected to improve the patient's condition.   Hildred Priest 02/09/2015, 10:38 AM

## 2015-02-09 NOTE — Progress Notes (Signed)
Initial Nutrition Assessment  DOCUMENTATION CODES:     INTERVENTION: Medical Nutrition Supplement: Nepro shake once daily Meals and Snacks: Cater to patient preferences  NUTRITION DIAGNOSIS:   (No nutrition concerns at this time)   GOAL:  Patient will meet greater than or equal to 90% of their needs  MONITOR:   (Energy Intake, Electrolyte and Renal Profile, Supplement Acceptance, I/O's )  REASON FOR ASSESSMENT:  Malnutrition Screening Tool    ASSESSMENT:  Reason For Admission: Depression PMHx:  Past Medical History  Diagnosis Date  . PVD (peripheral vascular disease)   . Shortness of breath dyspnea   . Constipation   . Anemia     unsure  . CHF (congestive heart failure) 2015  . Diastolic congestive heart failure   . Diabetes mellitus 2015    type 2  . GERD (gastroesophageal reflux disease)     unsure  . Hypertension 2015  . Chronic renal disease, stage IV 2015  . Cocaine abuse   . Cellulitis and abscess of foot 08/24/2011  . Protein calorie malnutrition   . Headache     Typical Fluid/ Food Intake: 100% of meals noted per I/O Meal/ Snack Patterns: Unable to assess Supplements: Nepro QD  Labs:  Electrolyte and Renal Profile:  Recent Labs Lab 02/03/15 0410 02/08/15 1025 02/08/15 1230  BUN 48* 42* 40*  CREATININE 5.70* 5.40* 5.90*  NA 131* 139 137  K 4.5 5.2 4.8  PHOS 4.2  --   --    Protein Profile:  Recent Labs Lab 02/03/15 0410 02/08/15 1025 02/08/15 1230  ALBUMIN 2.4* 3.1* 3.0*    Meds: reviewed  Physical Findings: n/a Weight Changes: weight changes noted below. New HD patient.   Height:  Ht Readings from Last 1 Encounters:  02/08/15 5\' 11"  (1.803 m)    Weight:  Wt Readings from Last 1 Encounters:  02/08/15 177 lb (80.287 kg)    Ideal Body Weight:     Wt Readings from Last 10 Encounters:  02/08/15 177 lb (80.287 kg)  02/08/15 182 lb 1.6 oz (82.6 kg)  02/08/15 184 lb (83.462 kg)  02/03/15 186 lb 1.1 oz (84.4 kg)   12/15/14 189 lb 6.4 oz (85.911 kg)  12/07/14 183 lb (83.008 kg)  11/29/14 193 lb 12.8 oz (87.907 kg)  11/16/14 195 lb (88.451 kg)  11/05/14 192 lb (87.091 kg)  10/07/14 195 lb (88.451 kg)    BMI:  Body mass index is 24.7 kg/(m^2).  Estimated Nutritional Needs:  Kcal:  9629-5284 kcal/ day (BEE: 1506 x 1.2 AF x 1.0-1.2 IF)  Protein:  97-120 g Pro/day (1.2-1.5 g Pro/kg/ day)  Fluid:  1000 ml + UOP  Skin:  Reviewed, no issues  Diet Order:  Diet renal with fluid restriction Fluid restriction:: 1200 mL Fluid; Room service appropriate?: Yes; Fluid consistency:: Thin  EDUCATION NEEDS:  No education needs identified at this time   Intake/Output Summary (Last 24 hours) at 02/09/15 1623 Last data filed at 02/09/15 1329  Gross per 24 hour  Intake    720 ml  Output      0 ml  Net    720 ml    Last BM:  reviewed  Roda Shutters, RDN Pager: [phone] Office: [place]

## 2015-02-09 NOTE — Progress Notes (Signed)
Noted diabetic nurse's input - will change insulin Sliding scale per their recommendations.

## 2015-02-09 NOTE — Plan of Care (Signed)
Problem: Alteration in mood Goal: LTG-Patient reports reduction in suicidal thoughts (Patient reports reduction in suicidal thoughts and is able to verbalize a safety plan for whenever patient is feeling suicidal)  Outcome: Progressing Denies SI     

## 2015-02-09 NOTE — Consult Note (Addendum)
[place] [facility] [place] NAME: [name]    MR#:  [id number]  DATE OF BIRTH:  [birth year]  DATE OF ADMISSION:  02/08/2015  PRIMARY CARE PHYSICIAN: Minerva Ends, MD   REQUESTING/REFERRING PHYSICIAN: Dr. Merlyn Albert.  M.D.  CHIEF COMPLAINT:  Blood pressure management  HISTORY OF PRESENT ILLNESS:  [name]  is a 54 y.o. male with a known history of ESRD on dialysis, which is recently started was transferred from [facility] to our behavioral medicine Department for suicidal ideation.  We are being consulted for blood pressure management and lower extremity edema.  On looking at the chart.  Patient's blood pressure has been anywhere from 614E to 315Q systolic and diastolic 00Q-676P.  At home, patient was on Coreg, Lasix and hydralazine but here.  Patient has not been resumed on all his home medication.  He is on Coreg and hydralazine at low-dose.  Patient denies any symptoms at this time.  He is not happy with what happened to him at his young age with having 2 young children and him going to dialysis. He had dialysis Tues and his arm aching from treatment. States he would like to find another dialysis center.Not happy with the one he goes to.  PAST MEDICAL HISTORY:   Past Medical History  Diagnosis Date  . PVD (peripheral vascular disease)   . Shortness of breath dyspnea   . Constipation   . Anemia     unsure  . CHF (congestive heart failure) 2015  . Diastolic congestive heart failure   . Diabetes mellitus 2015    type 2  . GERD (gastroesophageal reflux disease)     unsure  . Hypertension 2015  . Chronic renal disease, stage IV 2015  . Cocaine abuse   . Cellulitis and abscess of foot 08/24/2011  . Protein calorie malnutrition   . Headache     PAST SURGICAL HISTOIRY:   Past Surgical History  Procedure Laterality Date  . Leg surgery    . Knee surgery Right   . Amputation  09/06/2011   Procedure: AMPUTATION RAY;  Surgeon: Wylene Simmer, MD;  Location: [place];  Service: Orthopedics;  Laterality: Left;  Left Hallux Amputation  . Av fistula placement Left 08/12/2014    Procedure: CREATION OF A BRACHIOCEPHALIC ARTERIOVENOUS (AV) FISTULA  LEFT ARM;  Surgeon: Mal Misty, MD;  Location: [place];  Service: Vascular;  Laterality: Left;  . Av fistula placement Right 10/07/2014    Procedure: Creation Right Arm Arteriovenous fistula;  Surgeon: Mal Misty, MD;  Location: [place];  Service: Vascular;  Laterality: Right;  . Appendectomy      SOCIAL HISTORY:   History  Substance Use Topics  . Smoking status: Never Smoker   . Smokeless tobacco: Never Used  . Alcohol Use: No    FAMILY HISTORY:   Family History  Problem Relation Age of Onset  . Adopted: Yes  . Varicose Veins Mother   . Varicose Veins Sister     DRUG ALLERGIES:  No Known Allergies  REVIEW OF SYSTEMS:  CONSTITUTIONAL: No fever, fatigue or weakness.  EYES: No blurred or double vision.  EARS, NOSE, AND THROAT: No tinnitus or ear pain.  RESPIRATORY: No cough, shortness of breath, wheezing or hemoptysis.  CARDIOVASCULAR: No chest pain, orthopnea, edema 2 + .  GASTROINTESTINAL: No nausea, vomiting, diarrhea or abdominal pain.  GENITOURINARY: No dysuria, hematuria.  ENDOCRINE: No polyuria, nocturia,  HEMATOLOGY: No anemia,  easy bruising or bleeding SKIN: No rash or lesion. MUSCULOSKELETAL: No joint pain or arthritis.   NEUROLOGIC: No tingling, numbness, weakness.  PSYCHIATRY: No anxiety but has depression and been admitted for suicidal ideation   MEDICATIONS AT HOME:   Prior to Admission medications   Medication Sig Start Date End Date Taking? Authorizing Provider  atorvastatin (LIPITOR) 80 MG tablet Take 1 tablet (80 mg total) by mouth daily at 6 PM. 02/03/15  Yes Robbie Lis, MD  calcitRIOL (ROCALTROL) 0.25 MCG capsule Take 1 capsule (0.25 mcg total) by mouth daily. 11/05/14  Yes [name], MD  calcium  acetate (PHOSLO) 667 MG capsule Take 2 capsules (1,334 mg total) by mouth 3 (three) times daily with meals. 12/15/14  Yes Renella Cunas, MD  carvedilol (COREG) 25 MG tablet Take 1 tablet (25 mg total) by mouth 2 (two) times daily with a meal. 09/15/14  Yes Renella Cunas, MD  famotidine (PEPCID) 40 MG tablet Take 1 tablet (40 mg total) by mouth 2 (two) times daily. 12/01/14  Yes Thurnell Lose, MD  FLUoxetine (PROZAC) 20 MG capsule Take 1 capsule (20 mg total) by mouth daily. 02/03/15  Yes Robbie Lis, MD  furosemide (LASIX) 80 MG tablet Take 2 tablets (160 mg total) by mouth 3 (three) times daily. Per renal 12/28/14  Yes [name], MD  hydrALAZINE (APRESOLINE) 100 MG tablet Take 1 tablet (100 mg total) by mouth 3 (three) times daily. 12/07/14  Yes Josalyn Funches, MD  insulin aspart (NOVOLOG) 100 UNIT/ML injection Before each meal 3 times a day, 140-199 - 2 units, 200-250 - 4 units, 251-299 - 6 units,  300-349 - 8 units,  350 or above 10 units. Insulin PEN if approved, provide syringes and needles if needed. Can switch to any short-acting insulin. 12/01/14  Yes Thurnell Lose, MD  Insulin Syringe-Needle U-100 25G X 1" 1 ML MISC Any brand, for 4 times a day insulin SQ, 1 month supply. 12/01/14  Yes Thurnell Lose, MD  montelukast (SINGULAIR) 10 MG tablet Take 1 tablet (10 mg total) by mouth at bedtime. 12/01/14  Yes Thurnell Lose, MD  traZODone (DESYREL) 50 MG tablet Take 0.5 tablets (25 mg total) by mouth at bedtime. 02/08/15  Yes Josalyn Funches, MD      VITAL SIGNS:  Blood pressure 170/86, pulse 85, temperature 98.3 F (36.8 C), temperature source Oral, resp. rate 20, height 5\' 11"  (1.803 m), weight 80.287 kg (177 lb), SpO2 99 %.  PHYSICAL EXAMINATION:  GENERAL:  54 y.o.-year-old patient lying in the bed with no acute distress.  EYES: Pupils equal, round, reactive to light and accommodation. No scleral icterus. Extraocular muscles intact.  HEENT: Head atraumatic, normocephalic. Oropharynx  and nasopharynx clear.  NECK:  Supple, no jugular venous distention. No thyroid enlargement, no tenderness.  LUNGS: Normal breath sounds bilaterally, no wheezing, rales,rhonchi or crepitation. No use of accessory muscles of respiration.  CARDIOVASCULAR: S1, S2 normal. No murmurs, rubs, or gallops.  ABDOMEN: Soft, nontender, nondistended. Bowel sounds present. No organomegaly or mass.  EXTREMITIES: 2+ pedal edema, no cyanosis, or clubbing. Left great toe amputated due to an accident NEUROLOGIC: Cranial nerves II through XII are intact. Muscle strength 5/5 in all extremities. Sensation intact. Gait not checked.  PSYCHIATRIC: The patient is alert and oriented x 3.  SKIN: No obvious rash, lesion, or ulcer. fistula present in right upper arm with a good thrill and bruit. left forearm dime sized red raised area where he said  he got a TB skin test recently.  Minimal bruising in right area where fistula has been inserted.  LABORATORY PANEL:   CBC  Recent Labs Lab 02/08/15 1230  WBC 12.3*  HGB 9.1*  HCT 28.9*  PLT 333   ------------------------------------------------------------------------------------------------------------------  Chemistries   Recent Labs Lab 02/08/15 1230  NA 137  K 4.8  CL 101  CO2 25  GLUCOSE 139*  BUN 40*  CREATININE 5.90*  CALCIUM 9.4  AST 37  ALT 34  ALKPHOS 173*  BILITOT 0.7   ------------------------------------------------------------------------------------------------------------------  IMPRESSION AND PLAN:   * Uncontrolled hypertension: Likely from not resuming his home regimen.  Increase his dose of hydralazine (currently on 50 mg 3 times a day, but at home he takes 100 mg 3 times a day) to his home regimen.  Continue Coreg.  Resume Lasix also  * ESRD: Consult nephrology for dialysis need.  He gets Tuesday, Thursday and Saturday dialysis and seemed to be interested in changing his outpatient dialysis place  * Diabetes mellitus: We will start  him on low-dose sliding scale insulin and start him on diabetic diet, consult diabetic nurse. Check HbA1C.  * Chronic diastolic heart failure: Well compensated at this time, he does have some lower extremity edema, although he is on dialysis which should help this.  Resume Lasix also.   All the records are reviewed and case discussed with Consulting provider. Management plans discussed with the patient, and he is in agreement.  CODE STATUS: Full code  TOTAL TIME TAKING CARE OF THIS PATIENT: 35 minutes.    [facility], Jassica Zazueta M.D on 02/09/2015 at 11:36 AM  Between 7am to 6pm - Pager - [phone]  After 6pm go to [email or web address] - password [email or web address] [facility]  Office  [phone]  CC: Primary care Physician: Minerva Ends, MD

## 2015-02-09 NOTE — Progress Notes (Signed)
Inpatient Diabetes Program Recommendations  AACE/ADA: New Consensus Statement on Inpatient Glycemic Control (2013)  Target Ranges:  Prepandial:   less than 140 mg/dL      Peak postprandial:   less than 180 mg/dL (1-2 hours)      Critically ill patients:  140 - 180 mg/dL     Results for AEON, KESSNER (MRN [id number]) as of 02/09/2015 13:36  Ref. Range 02/09/2015 07:06 02/09/2015 11:29  Glucose-Capillary Latest Ref Range: 65-99 mg/dL 123 (H) 223 (H)     Admit with: Depression/ Suicidal Ideation  History: DM, ESRD (recently started dialysis), CHF, ETOH, HTN  Home DM Meds: Novolog SSI tid with meals at home  Current DM Orders: Novolog Resistant SSI (0-20 units) tid ac + HS            Novolog 4 units tid with meals (Meal Coverage)    **Note patient's toxicology screen came back positive for cocaine this admission.  **Per Chart Review, note patient was admitted to [facility] on 11/29/14.  During that admission, patient's oral DM medication was stopped (Glipizide) and patient was discharged home on Novolog SSI tid with meals b/c of worsening renal function.  Patient was provided with extensive DM education during that admission and patient was also educated on how to use insulin at home (vial and syringe in addition to insulin pens).    **Patient gets his regular medical care through the [facility] and [facility] located across the street from [facility] in [place].  **Will ask RNs caring for patient in the [place] [facility] to please check and make sure patient is comfortable drawing up and giving insulin.    **Note that another Hemoglobin A1c has been ordered for the patient.  Last A1c on file was 5.8% on 11/30/14.     MD-  Patient currently has orders for Novolog Resistant SSI (0-20 units) tid ac + HS.  Patient also currently has orders for Novolog 4 units tid with meals (Meal Coverage).  This may be too much Novolog for patient  since he has ESRD.    MD- Please consider reducing Novolog SSI to Sensitive scale (0-9 units) tid ac + HS.  Would continue Novolog 4 units tid with meals for now and we can re-evaluate patient's glucose levels daily.     Will follow Wyn Quaker RN, MSN, CDE Diabetes Coordinator Inpatient Glycemic Control Team Team Pager: [phone] (8a-5p)

## 2015-02-09 NOTE — Progress Notes (Signed)
D:Patient Attending unit programming . Affect flat , with depressed Mood. Appetite fair and voice no. concerns around sleep. Interacting with peers and staff . Voice of concentration poor and energy level normal. Denies suicidal ideations Depression 0, Hopelessness 0 and Anxiety0  ( low 0-10 high)  A: Encourage participation . Instruction on medications, verbalize understanding  R: Voice no other Concerns

## 2015-02-09 NOTE — Progress Notes (Signed)
[facility] LCSW Aftercare Discharge Planning Group Note  02/09/2015 10:42 AM  Participation Quality:  Appropriate  Affect:  Appropriate  Cognitive:  Appropriate  Insight:  Engaged  Engagement in Group:  Engaged  Modes of Intervention:  Discussion, Education and Support  Summary of Progress/Problems: Patients goal is to keep positive and work with the hospital rules  Enis Slipper M 02/09/2015, 10:42 AM

## 2015-02-09 NOTE — Progress Notes (Signed)
Pt has been calm and cooperative with unit routine. Verbalizes improvement in mood. Rated his mood as 0/10 (No depression). States "I feel better." No attempts to harm self or others. Denies si/hi or avh.  In dayroom during the latter part of the evening looking at the basketball game. Compliant with hs medications. BS 110 at hs. No sliding scale coverage required. Received Acetaminophen 650mg  for c/o h/a rating 5/10. Pain decreased to 2/10. Patient reports that he had a BM today. No s/s of acute distress.  Will cont to monitor per protocol for continuity of care and safety.

## 2015-02-09 NOTE — [facility] Group Notes (Signed)
[facility] Group Notes:  (Nursing/MHT/Case Management/Adjunct)  Date:  02/09/2015  Time:  12:05 PM  Type of Therapy:  Group Therapy  Participation Level:  Did Not Attend  mary of Progress/Problems:  Drake Leach 02/09/2015, 12:05 PM
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
## 2015-02-10 LAB — GLUCOSE, CAPILLARY
Glucose-Capillary: 112 mg/dL — ABNORMAL HIGH (ref 65–99)
Glucose-Capillary: 155 mg/dL — ABNORMAL HIGH (ref 65–99)
Glucose-Capillary: 180 mg/dL — ABNORMAL HIGH (ref 65–99)

## 2015-02-10 LAB — RENAL FUNCTION PANEL
ALBUMIN: 2.7 g/dL — AB (ref 3.5–5.0)
ANION GAP: 9 (ref 5–15)
BUN: 38 mg/dL — ABNORMAL HIGH (ref 6–20)
CO2: 24 mmol/L (ref 22–32)
CREATININE: 4.88 mg/dL — AB (ref 0.61–1.24)
Calcium: 8.6 mg/dL — ABNORMAL LOW (ref 8.9–10.3)
Chloride: 103 mmol/L (ref 101–111)
GFR calc Af Amer: 14 mL/min — ABNORMAL LOW (ref >60)
GFR calc non Af Amer: 12 mL/min — ABNORMAL LOW (ref >60)
GLUCOSE: 233 mg/dL — AB (ref 65–99)
Phosphorus: 3.5 mg/dL (ref 2.5–4.6)
Potassium: 5.1 mmol/L (ref 3.5–5.1)
SODIUM: 136 mmol/L (ref 135–145)

## 2015-02-10 LAB — CBC
HEMATOCRIT: 28.8 % — AB (ref 40.0–52.0)
Hemoglobin: 9.1 g/dL — ABNORMAL LOW (ref 13.0–18.0)
MCH: 31.5 pg (ref 26.0–34.0)
MCHC: 31.7 g/dL — ABNORMAL LOW (ref 32.0–36.0)
MCV: 99.1 fL (ref 80.0–100.0)
Platelets: 343 10*3/uL (ref 150–440)
RBC: 2.9 MIL/uL — ABNORMAL LOW (ref 4.40–5.90)
RDW: 19.9 % — ABNORMAL HIGH (ref 11.5–14.5)
WBC: 11.7 10*3/uL — ABNORMAL HIGH (ref 3.8–10.6)

## 2015-02-10 MED ORDER — LOSARTAN POTASSIUM 50 MG PO TABS: 25.0000 mg | ORAL_TABLET | Freq: Every day | ORAL | Status: DC

## 2015-02-10 MED ORDER — EPOETIN ALFA 10000 UNIT/ML IJ SOLN
10000.0000 [IU] | Freq: Once | INTRAMUSCULAR | Status: AC
  Administered 2015-02-10: 10000 [IU] via INTRAVENOUS

## 2015-02-10 NOTE — Plan of Care (Signed)
Problem: Alteration in mood Goal: LTG-Patient reports reduction in suicidal thoughts (Patient reports reduction in suicidal thoughts and is able to verbalize a safety plan for whenever patient is feeling suicidal)  Outcome: Progressing Pt denies SI thoughts. Pt reports not depressed Goal: LTG-Pt's behavior demonstrates decreased signs of depression (Patient's behavior demonstrates decreased signs of depression to the point the patient is safe to return home and continue treatment in an outpatient setting)  Outcome: Progressing Denies depression. Goal: STG-Patient is able to discuss feelings and issues (Patient is able to discuss feelings and issues leading to depression)  Outcome: Not Progressing Pt would not elaborate on what brought him him Goal: STG-Patient reports thoughts of self-harm to staff Outcome: Not Applicable Date Met:  25/36/64 Pt denies self harm

## 2015-02-10 NOTE — Progress Notes (Addendum)
Inpatient Diabetes Program Recommendations  AACE/ADA: New Consensus Statement on Inpatient Glycemic Control (2013)  Target Ranges:  Prepandial:   less than 140 mg/dL      Peak postprandial:   less than 180 mg/dL (1-2 hours)      Critically ill patients:  140 - 180 mg/dL   Results for [name], [name] (MRN [id number]) as of 02/10/2015 08:52  Ref. Range 02/09/2015 07:06 02/09/2015 11:29 02/09/2015 16:52 02/09/2015 21:00 02/10/2015 07:15  Glucose-Capillary Latest Ref Range: 65-99 mg/dL 123 (H) 223 (H) 87 110 (H) 112 (H)   Results for [name], [name] (MRN [id number]) as of 02/10/2015 08:52  Ref. Range 02/09/2015 07:06 02/09/2015 11:29 02/09/2015 16:52 02/09/2015 21:00 02/10/2015 07:15  Glucose-Capillary Latest Ref Range: 65-99 mg/dL 123 (H) 223 (H) 87 110 (H) 112 (H)   Reason for assessment: consult  Diabetes history: Type 2 Outpatient Diabetes medications: Novolog mealtime insulin based on a sliding scale Current orders for Inpatient glycemic control: Novolog correction scale tid and hs, Novolog 4 units tid with meals  MD- please consider d/c order for Novolog 4 units tid with meals but continue Novolog correction scale tid and hs.  CBG have generally been low.  I spoke with RN this morning regarding my recommendations.  She has already given Novolog 4 units this am-  I reviewed the hypoglycemia protocol with her and asked her to be alert to the possibility of a low blood sugar.  I have also spoken to the patient and reviewed the signs and symptoms and the treatment of low blood sugars.  I have asked him to carry candy with him at all times as an "insurance", should he have a low blood sugar. He acknowledged understanding and has agreed to carry candies with him.  He has been taking his insulin as ordered and states he has insulin and supplies at home.  I have instructed the patient to tell a staff member if he has symptoms of low blood sugars while an inpatient.   Extensive education on 12/01/14 when  patient d/c'd from [facility].  Gwen Rudie Meyer has also reviewed insulin administration with the patient here at [facility].   Gentry Fitz, RN, BA, MHA, CDE Diabetes Coordinator Inpatient Diabetes Program  [phone] (Team Pager) [phone] ([place]) 02/10/2015 9:17 AM

## 2015-02-10 NOTE — Progress Notes (Signed)
Pt pleasant and cooperative with care. Denies depression, SI, AVH. States he feels better now. Med and group compliant. Appropriate with staff and peers. No negative behaviors noted. Pt had dialysis today. Took off 500 cc fluid. Pt tolerated well. Pt states dialysis had wiped him out. Pt encouraged to rest. Will continue to assess and monitor for safety.

## 2015-02-10 NOTE — Consult Note (Signed)
[facility]  CONSULT NOTE    Date: 02/10/2015                  Patient Name:  [name]  MRN: [id number]  DOB: Feb 08, 1961  Age / Sex: 54 y.o., male         PCP: Minerva Ends, MD                 Service Requesting Consult: End Stage Renal Disease requiring hemodialysis                 Reason for Consult: Dr. Jerilee Hoh            History of Present Illness: [name] is a 54 y.o. Hispanic  male with end stage renal disease TTS shift at [facility] on [facility]. His primary nephrologist is Dr. Mercy Moore. Patient has a past medical history of multiple myeloma,  peripheral vascular disease, congestive heart failure, diabetes mellitus type II, GERD, hypertension, cocaine and alcohol abuse. Patient was admitted to [facility] on 02/08/2015 as [name].  Patient has endorsed suicidal ideations.  Seen and examined on hemodialysis.    Medications: Outpatient medications: Prescriptions prior to admission  Medication Sig Dispense Refill Last Dose  . atorvastatin (LIPITOR) 80 MG tablet Take 1 tablet (80 mg total) by mouth daily at 6 PM. 30 tablet 0 02/08/2015 at Unknown time  . calcitRIOL (ROCALTROL) 0.25 MCG capsule Take 1 capsule (0.25 mcg total) by mouth daily. 30 capsule 11 02/08/2015 at Unknown time  . calcium acetate (PHOSLO) 667 MG capsule Take 2 capsules (1,334 mg total) by mouth 3 (three) times daily with meals. 180 capsule 0 02/08/2015 at Unknown time  . carvedilol (COREG) 25 MG tablet Take 1 tablet (25 mg total) by mouth 2 (two) times daily with a meal. 60 tablet 11 02/08/2015 at Unknown time  . famotidine (PEPCID) 40 MG tablet Take 1 tablet (40 mg total) by mouth 2 (two) times daily. 60 tablet 1 02/08/2015 at Unknown time  . FLUoxetine (PROZAC) 20 MG capsule Take 1 capsule (20 mg total) by mouth daily. 30 capsule 0 02/08/2015 at Unknown time  . furosemide (LASIX) 80 MG tablet Take 2 tablets (160 mg total) by mouth 3  (three) times daily. Per renal 120 tablet 11 02/08/2015 at Unknown time  . hydrALAZINE (APRESOLINE) 100 MG tablet Take 1 tablet (100 mg total) by mouth 3 (three) times daily. 90 tablet 5 02/08/2015 at Unknown time  . insulin aspart (NOVOLOG) 100 UNIT/ML injection Before each meal 3 times a day, 140-199 - 2 units, 200-250 - 4 units, 251-299 - 6 units,  300-349 - 8 units,  350 or above 10 units. Insulin PEN if approved, provide syringes and needles if needed. Can switch to any short-acting insulin. 10 mL 11 02/08/2015 at Unknown time  . Insulin Syringe-Needle U-100 25G X 1" 1 ML MISC Any brand, for 4 times a day insulin SQ, 1 month supply. 30 each 0 02/08/2015 at Unknown time  . montelukast (SINGULAIR) 10 MG tablet Take 1 tablet (10 mg total) by mouth at bedtime. 30 tablet 1 02/08/2015 at Unknown time  . traZODone (DESYREL) 50 MG tablet Take 0.5 tablets (25 mg total) by mouth at bedtime. 30 tablet 2 02/08/2015 at Unknown time    Current medications: Current Facility-Administered Medications  Medication Dose Route Frequency Provider Last Rate Last Dose  . acetaminophen (TYLENOL) tablet 650 mg  650 mg Oral Q6H PRN  Hildred Priest, MD   650 mg at 02/09/15 2212  . alum & mag hydroxide-simeth (MAALOX/MYLANTA) 200-200-20 MG/5ML suspension 30 mL  30 mL Oral Q4H PRN Hildred Priest, MD      . atorvastatin (LIPITOR) tablet 80 mg  80 mg Oral q1800 Hildred Priest, MD   80 mg at 02/09/15 1759  . calcitRIOL (ROCALTROL) capsule 0.25 mcg  0.25 mcg Oral Daily Hildred Priest, MD   0.25 mcg at 02/10/15 0811  . calcium acetate (PHOSLO) capsule 1,334 mg  1,334 mg Oral TID WC Hildred Priest, MD   1,334 mg at 02/10/15 0800  . carvedilol (COREG) tablet 25 mg  25 mg Oral BID WC Hildred Priest, MD   25 mg at 02/10/15 8099  . famotidine (PEPCID) tablet 40 mg  40 mg Oral BID Hildred Priest, MD   40 mg at 02/10/15 8338  . feeding supplement (NEPRO CARB STEADY)  liquid 237 mL  237 mL Oral q morning - 10a Hildred Priest, MD   237 mL at 02/09/15 1221  . furosemide (LASIX) tablet 80 mg  80 mg Oral BID Max Sane, MD   80 mg at 02/10/15 0857  . hydrALAZINE (APRESOLINE) tablet 100 mg  100 mg Oral 3 times per day Max Sane, MD   100 mg at 02/10/15 0650  . insulin aspart (novoLOG) injection 0-5 Units  0-5 Units Subcutaneous QHS Max Sane, MD   0 Units at 02/09/15 2209  . insulin aspart (novoLOG) injection 0-9 Units  0-9 Units Subcutaneous TID WC Max Sane, MD   0 Units at 02/10/15 0805  . insulin aspart (novoLOG) injection 4 Units  4 Units Subcutaneous TID WC Max Sane, MD   4 Units at 02/10/15 0858  . losartan (COZAAR) tablet 25 mg  25 mg Oral Daily Hillary Bow, MD   25 mg at 02/10/15 1000  . magnesium hydroxide (MILK OF MAGNESIA) suspension 30 mL  30 mL Oral Daily PRN Hildred Priest, MD      . mirtazapine (REMERON) tablet 15 mg  15 mg Oral QHS Hildred Priest, MD   15 mg at 02/09/15 2213  . montelukast (SINGULAIR) tablet 10 mg  10 mg Oral QHS Hildred Priest, MD   10 mg at 02/09/15 2214  . sertraline (ZOLOFT) tablet 50 mg  50 mg Oral QHS Hildred Priest, MD   50 mg at 02/09/15 2214      Allergies: No Known Allergies    Past Medical History: Past Medical History  Diagnosis Date  . PVD (peripheral vascular disease)   . Shortness of breath dyspnea   . Constipation   . Anemia     unsure  . CHF (congestive heart failure) 2015  . Diastolic congestive heart failure   . Diabetes mellitus 2015    type 2  . GERD (gastroesophageal reflux disease)     unsure  . Hypertension 2015  . Chronic renal disease, stage IV 2015  . Cocaine abuse   . Cellulitis and abscess of foot 08/24/2011  . Protein calorie malnutrition   . Headache      Past Surgical History: Past Surgical History  Procedure Laterality Date  . Leg surgery    . Knee surgery Right   . Amputation  09/06/2011    Procedure: AMPUTATION  RAY;  Surgeon: Wylene Simmer, MD;  Location: [place];  Service: Orthopedics;  Laterality: Left;  Left Hallux Amputation  . Av fistula placement Left 08/12/2014    Procedure: CREATION OF A BRACHIOCEPHALIC ARTERIOVENOUS (AV) FISTULA  LEFT  ARM;  Surgeon: Mal Misty, MD;  Location: [place];  Service: Vascular;  Laterality: Left;  . Av fistula placement Right 10/07/2014    Procedure: Creation Right Arm Arteriovenous fistula;  Surgeon: Mal Misty, MD;  Location: [place];  Service: Vascular;  Laterality: Right;  . Appendectomy       Family History: Family History  Problem Relation Age of Onset  . Adopted: Yes  . Varicose Veins Mother   . Varicose Veins Sister      Social History: History   Social History  . Marital Status: Legally Separated    Spouse Name: N/A  . Number of Children: N/A  . Years of Education: N/A   Occupational History  . Not on file.   Social History Main Topics  . Smoking status: Never Smoker   . Smokeless tobacco: Never Used  . Alcohol Use: No  . Drug Use: Yes    Special: Cocaine, Marijuana     Comment: Pt reports last use of cocaine was "last month"   As of 10/06/14  last was 2 months ago  . Sexual Activity: Not on file   Other Topics Concern  . Not on file   Social History Narrative     Review of Systems: ROS  Vital Signs: Blood pressure 127/67, pulse 70, temperature 98 F (36.7 C), temperature source Oral, resp. rate 16, height 5' 11"  (1.803 m), weight 80.2 kg (176 lb 12.9 oz), SpO2 99 %.  Weight trends: Filed Weights   02/08/15 2300 02/10/15 0930  Weight: 80.287 kg (177 lb) 80.2 kg (176 lb 12.9 oz)    Physical Exam: General: NAD,   Head: Normocephalic, atraumatic. Moist oral mucosal membranes  Eyes: Anicteric, PERRL  Neck: Supple, trachea midline  Lungs:  Clear to auscultation  Heart: Regular rate and rhythm  Abdomen:  Soft, nontender,   Extremities: no peripheral edema.  Neurologic: Nonfocal, moving all four extremities  Skin: No  lesions  Access: Right arm AVF     Lab results: Basic Metabolic Panel:  Recent Labs Lab 02/08/15 1025 02/08/15 1230  NA 139 137  K 5.2 4.8  CL 102 101  CO2 27 25  GLUCOSE 146* 139*  BUN 42* 40*  CREATININE 5.40* 5.90*  CALCIUM 9.6 9.4    Liver Function Tests:  Recent Labs Lab 02/08/15 1025 02/08/15 1230  AST 35 37  ALT 29 34  ALKPHOS 176* 173*  BILITOT 0.6 0.7  PROT 6.8 7.3  ALBUMIN 3.1* 3.0*   No results for input(s): LIPASE, AMYLASE in the last 168 hours. No results for input(s): AMMONIA in the last 168 hours.  CBC:  Recent Labs Lab 02/08/15 1025 02/08/15 1230  WBC 11.7* 12.3*  NEUTROABS  --  9.1*  HGB 9.0* 9.1*  HCT 28.5* 28.9*  MCV 98.3 99.0  PLT 365 333    Cardiac Enzymes: No results for input(s): CKTOTAL, CKMB, CKMBINDEX, TROPONINI in the last 168 hours.  BNP: Invalid input(s): POCBNP  CBG:  Recent Labs Lab 02/09/15 0706 02/09/15 1129 02/09/15 1652 02/09/15 2100 02/10/15 0715  GLUCAP 123* 223* 87 110* 112*    Microbiology: Results for orders placed or performed during the hospital encounter of 01/26/15  MRSA PCR Screening     Status: None   Collection Time: 01/26/15  9:01 PM  Result Value Ref Range Status   MRSA by PCR NEGATIVE NEGATIVE Final    Comment:        The GeneXpert MRSA Assay (FDA approved for NASAL specimens  only), is one component of a comprehensive MRSA colonization surveillance program. It is not intended to diagnose MRSA infection nor to guide or monitor treatment for MRSA infections.     Coagulation Studies: No results for input(s): LABPROT, INR in the last 72 hours.  Urinalysis: No results for input(s): COLORURINE, LABSPEC, PHURINE, GLUCOSEU, HGBUR, BILIRUBINUR, KETONESUR, PROTEINUR, UROBILINOGEN, NITRITE, LEUKOCYTESUR in the last 72 hours.  Invalid input(s): APPERANCEUR    Imaging:  No results found.   Assessment & Plan: Mr. Justinian Miano is a 54 y.o. Hispanic  male with end stage  renal disease TTS shift at [facility] on [facility]. His primary nephrologist is Dr. Mercy Moore. Patient has a past medical history of multiple myeloma,  peripheral vascular disease, congestive heart failure, diabetes mellitus type II, GERD, hypertension, cocaine and alcohol abuse. Patient was admitted to [facility] on 02/08/2015 as [name].   1. End Stage Renal Disease: TTS. Placed on hemodialysis. Tolerating treatment well. AVF functioning well. ESRD secondary to multiple myeloma, hypertension, diabetes mellitus type II.  - Continue TTS schedule.  - Minimal UF today  2. Hypertension: well controlled.  - Current regimen of furosemide, hydralazine, carvedilol, and losartan.   3. Anemia of chronic kidney disease: hemoglobin 9.1 - epo with treatment.   4. Secondary Hyperparathyroidism: on calcitriol. PTH of 177 as outpatient.  - check PTH, and phosphorus.  - Continue calcium acetate 2 tabs with meals.      LOS: 2 Annsleigh Dragoo 6/9/201611:27 AM

## 2015-02-10 NOTE — [facility] Group Notes (Signed)
[place] Group Notes:  (Nursing/MHT/Case Management/Adjunct)  Date:  02/10/2015  Time:  2:58 PM  Type of Therapy:  Group Therapy  Participation Level:  Did Not Attend  Summary of Progress/Problems:  [name] 02/10/2015, 2:58 PM

## 2015-02-10 NOTE — Progress Notes (Signed)
Recreation Therapy Notes  Date: 06.09.16 Time: 3:00 pm Location: Craft Room  Group Topic: Leisure Education  Goal Area(s) Addresses:  Patient will identify activities for each letter of the alphabet. Patient will verbalize ability to integrate positive leisure into life post d/c. Patient will verbalize ability to use leisure as a Technical sales engineer.  Behavioral Response: Did not attend  Intervention: Leisure Alphabet  Activity: Patients were given a worksheet with the alphabet on it and instructed to list healthy leisure activities for each letter of the alphabet.  Education: LRT educated patient on what is needed to participate in leisure  Education Outcome: Patient did not attend group.  Clinical Observations/Feedback: Patient did not attend group.  Leonette Monarch, LRT/CTRS 02/10/2015 4:14 PM

## 2015-02-10 NOTE — Progress Notes (Signed)
[facility] MD Progress Note  04/10/5026 7:41 PM [name]  MRN:  [id number] Subjective:  Pt reports feeling better. Denies having SI, HI or A/Vh.  States he tolerated well dialysis this am but now feels very tired and weak.  Pt slept well last night and his oral intake has been good.   Pt is tolerating medications well w/o side effects.  Denies any other physical complaints.  Principal Problem: Major depressive disorder, single episode, moderate Diagnosis:   Patient Active Problem List   Diagnosis Date Noted  . Major depressive disorder, single episode, moderate [F32.1] 02/09/2015  . Stimulant use disorder (cocaine) [F15.99] 02/09/2015  . Alcohol use disorder, moderate, dependence [F10.20] 02/09/2015  . Acute diastolic CHF (congestive heart failure) [I50.31]   . IgA monoclonal gammopathy [D47.2] 12/28/2014  . GERD (gastroesophageal reflux disease) [K21.9] 11/08/2014  . CKD (chronic kidney disease), stage IV [N18.4] 09/08/2013  . DM (diabetes mellitus), type 2, uncontrolled, with renal complications [C94.70, J62.83] 09/06/2013  . Hypertension [I10] 08/27/2011  . Anemia of chronic renal failure, stage 4 (severe) [N18.4, D63.1] 08/27/2011   Total Time spent with patient: 30 minutes   Past Medical History:  Past Medical History  Diagnosis Date  . PVD (peripheral vascular disease)   . Shortness of breath dyspnea   . Constipation   . Anemia     unsure  . CHF (congestive heart failure) 2015  . Diastolic congestive heart failure   . Diabetes mellitus 2015    type 2  . GERD (gastroesophageal reflux disease)     unsure  . Hypertension 2015  . Chronic renal disease, stage IV 2015  . Cocaine abuse   . Cellulitis and abscess of foot 08/24/2011  . Protein calorie malnutrition   . Headache     Past Surgical History  Procedure Laterality Date  . Leg surgery    . Knee surgery Right   . Amputation  09/06/2011    Procedure: AMPUTATION RAY;  Surgeon: Wylene Simmer, MD;  Location: [place];   Service: Orthopedics;  Laterality: Left;  Left Hallux Amputation  . Av fistula placement Left 08/12/2014    Procedure: CREATION OF A BRACHIOCEPHALIC ARTERIOVENOUS (AV) FISTULA  LEFT ARM;  Surgeon: Mal Misty, MD;  Location: [place];  Service: Vascular;  Laterality: Left;  . Av fistula placement Right 10/07/2014    Procedure: Creation Right Arm Arteriovenous fistula;  Surgeon: Mal Misty, MD;  Location: [place];  Service: Vascular;  Laterality: Right;  . Appendectomy     Family History:  Family History  Problem Relation Age of Onset  . Adopted: Yes  . Varicose Veins Mother   . Varicose Veins Sister    Social History:  History  Alcohol Use No     History  Drug Use  . Yes  . Special: Cocaine, Marijuana    Comment: Pt reports last use of cocaine was "last month"   As of 10/06/14  last was 2 months ago    History   Social History  . Marital Status: Legally Separated    Spouse Name: N/A  . Number of Children: N/A  . Years of Education: N/A   Social History Main Topics  . Smoking status: Never Smoker   . Smokeless tobacco: Never Used  . Alcohol Use: No  . Drug Use: Yes    Special: Cocaine, Marijuana     Comment: Pt reports last use of cocaine was "last month"   As of 10/06/14  last was 2 months ago  .  Sexual Activity: Not on file   Other Topics Concern  . None   Social History Narrative   Additional History:    Sleep: Good  Appetite:  Good   Assessment:   Musculoskeletal: Strength & Muscle Tone: within normal limits Gait & Station: normal Patient leans: N/A   Psychiatric Specialty Exam: Physical Exam  HENT:  Mouth/Throat: Oropharynx is clear and moist.    Review of Systems  HENT: Negative.   Eyes: Negative.   Respiratory: Negative.   Cardiovascular: Negative.   Gastrointestinal: Negative.   Genitourinary: Negative.   Musculoskeletal: Positive for myalgias.  Skin: Negative.   Neurological: Positive for weakness.  Endo/Heme/Allergies: Negative.    Psychiatric/Behavioral: Negative for suicidal ideas and hallucinations. The patient is not nervous/anxious and does not have insomnia.     Blood pressure 141/60, pulse 77, temperature 98.2 F (36.8 C), temperature source Oral, resp. rate 13, height 5' 11"  (1.803 m), weight 80.2 kg (176 lb 12.9 oz), SpO2 99 %.Body mass index is 24.67 kg/(m^2).  General Appearance: Well Groomed  Engineer, water::  Good  Speech:  Normal Rate  Volume:  Normal  Mood:  Dysphoric  Affect:  Congruent  Thought Process:  Logical  Orientation:  Full (Time, Place, and Person)  Thought Content:  Hallucinations: None  Suicidal Thoughts:  No  Homicidal Thoughts:  No  Memory:  Immediate;   Good Recent;   Good Remote;   Good  Judgement:  Fair  Insight:  Shallow  Psychomotor Activity:  Normal  Concentration:  Good  Recall:  NA  Fund of Knowledge:Good  Language: Good  Akathisia:  No  Handed:    AIMS (if indicated):     Assets:  Communication Skills Desire for Improvement Financial Resources/Insurance Housing Vocational/Educational  ADL's:  Intact  Cognition: WNL  Sleep:  Number of Hours: 7     Current Medications: Current Facility-Administered Medications  Medication Dose Route Frequency Provider Last Rate Last Dose  . acetaminophen (TYLENOL) tablet 650 mg  650 mg Oral Q6H PRN Hildred Priest, MD   650 mg at 02/09/15 2212  . alum & mag hydroxide-simeth (MAALOX/MYLANTA) 200-200-20 MG/5ML suspension 30 mL  30 mL Oral Q4H PRN Hildred Priest, MD      . atorvastatin (LIPITOR) tablet 80 mg  80 mg Oral q1800 Hildred Priest, MD   80 mg at 02/09/15 1759  . calcitRIOL (ROCALTROL) capsule 0.25 mcg  0.25 mcg Oral Daily Hildred Priest, MD   0.25 mcg at 02/10/15 0811  . calcium acetate (PHOSLO) capsule 1,334 mg  1,334 mg Oral TID WC Hildred Priest, MD   1,334 mg at 02/10/15 0800  . carvedilol (COREG) tablet 25 mg  25 mg Oral BID WC Hildred Priest, MD   25  mg at 02/10/15 0973  . famotidine (PEPCID) tablet 40 mg  40 mg Oral BID Hildred Priest, MD   40 mg at 02/10/15 5329  . feeding supplement (NEPRO CARB STEADY) liquid 237 mL  237 mL Oral q morning - 10a Hildred Priest, MD   237 mL at 02/09/15 1221  . furosemide (LASIX) tablet 80 mg  80 mg Oral BID Max Sane, MD   80 mg at 02/10/15 0857  . hydrALAZINE (APRESOLINE) tablet 100 mg  100 mg Oral 3 times per day Max Sane, MD   100 mg at 02/10/15 0650  . insulin aspart (novoLOG) injection 0-5 Units  0-5 Units Subcutaneous QHS Max Sane, MD   0 Units at 02/09/15 2209  . insulin aspart (novoLOG) injection 0-9  Units  0-9 Units Subcutaneous TID WC Max Sane, MD   0 Units at 02/10/15 0805  . insulin aspart (novoLOG) injection 4 Units  4 Units Subcutaneous TID WC Max Sane, MD   4 Units at 02/10/15 0858  . magnesium hydroxide (MILK OF MAGNESIA) suspension 30 mL  30 mL Oral Daily PRN Hildred Priest, MD      . mirtazapine (REMERON) tablet 15 mg  15 mg Oral QHS Hildred Priest, MD   15 mg at 02/09/15 2213  . montelukast (SINGULAIR) tablet 10 mg  10 mg Oral QHS Hildred Priest, MD   10 mg at 02/09/15 2214  . sertraline (ZOLOFT) tablet 50 mg  50 mg Oral QHS Hildred Priest, MD   50 mg at 02/09/15 2214    Lab Results:  Results for orders placed or performed during the hospital encounter of 02/08/15 (from the past 48 hour(s))  Glucose, capillary     Status: Abnormal   Collection Time: 02/09/15  7:06 AM  Result Value Ref Range   Glucose-Capillary 123 (H) 65 - 99 mg/dL  Glucose, capillary     Status: Abnormal   Collection Time: 02/09/15 11:29 AM  Result Value Ref Range   Glucose-Capillary 223 (H) 65 - 99 mg/dL  Hemoglobin A1c     Status: None   Collection Time: 02/09/15  1:45 PM  Result Value Ref Range   Hgb A1c MFr Bld 6.0 4.0 - 6.0 %  Glucose, capillary     Status: None   Collection Time: 02/09/15  4:52 PM  Result Value Ref Range    Glucose-Capillary 87 65 - 99 mg/dL  Glucose, capillary     Status: Abnormal   Collection Time: 02/09/15  9:00 PM  Result Value Ref Range   Glucose-Capillary 110 (H) 65 - 99 mg/dL  Glucose, capillary     Status: Abnormal   Collection Time: 02/10/15  7:15 AM  Result Value Ref Range   Glucose-Capillary 112 (H) 65 - 99 mg/dL   Comment 1 Notify RN   Renal function panel     Status: Abnormal   Collection Time: 02/10/15 11:15 AM  Result Value Ref Range   Sodium 136 135 - 145 mmol/L   Potassium 5.1 3.5 - 5.1 mmol/L    Comment: HEMOLYSIS AT THIS LEVEL MAY AFFECT RESULT   Chloride 103 101 - 111 mmol/L   CO2 24 22 - 32 mmol/L   Glucose, Bld 233 (H) 65 - 99 mg/dL   BUN 38 (H) 6 - 20 mg/dL   Creatinine, Ser 4.88 (H) 0.61 - 1.24 mg/dL   Calcium 8.6 (L) 8.9 - 10.3 mg/dL   Phosphorus 3.5 2.5 - 4.6 mg/dL   Albumin 2.7 (L) 3.5 - 5.0 g/dL   GFR calc non Af Amer 12 (L) >60 mL/min   GFR calc Af Amer 14 (L) >60 mL/min    Comment: (NOTE) The eGFR has been calculated using the CKD EPI equation. This calculation has not been validated in all clinical situations. eGFR's persistently <60 mL/min signify possible Chronic Kidney Disease.    Anion gap 9 5 - 15  CBC     Status: Abnormal   Collection Time: 02/10/15 11:15 AM  Result Value Ref Range   WBC 11.7 (H) 3.8 - 10.6 K/uL   RBC 2.90 (L) 4.40 - 5.90 MIL/uL   Hemoglobin 9.1 (L) 13.0 - 18.0 g/dL   HCT 28.8 (L) 40.0 - 52.0 %   MCV 99.1 80.0 - 100.0 fL   MCH 31.5 26.0 -  34.0 pg   MCHC 31.7 (L) 32.0 - 36.0 g/dL   RDW 19.9 (H) 11.5 - 14.5 %   Platelets 343 150 - 440 K/uL    Physical Findings: AIMS: Facial and Oral Movements Muscles of Facial Expression: None, normal Lips and Perioral Area: None, normal Jaw: None, normal Tongue: None, normal,Extremity Movements Upper (arms, wrists, hands, fingers): None, normal Lower (legs, knees, ankles, toes): None, normal, Trunk Movements Neck, shoulders, hips: None, normal, Overall Severity Severity of  abnormal movements (highest score from questions above): None, normal Incapacitation due to abnormal movements: None, normal Patient's awareness of abnormal movements (rate only patient's report): No Awareness, Dental Status Current problems with teeth and/or dentures?: No Does patient usually wear dentures?: No  CIWA:    COWS:     Treatment Plan Summary: Daily contact with patient to assess and evaluate symptoms and progress in treatment and Medication management   54 year old Hispanic male with multiple medical problems. Patient has been recently diagnosed with endstage renal failure and just last month he started dialysis. The patient is not adjusting well to the news. In addition to his medical conditions patient is also having relational problems with his fiance as she has been abusing alcohol and drugs. Patient was admitted after he presented to [facility], emergency department reporting suicidal ideation with the plan of cutting his veins or jumping in front of a car.  Major depressive disorder: Patient will be started on sertraline 50 mg by mouth daily at bedtime and mirtazapine 15 mg by mouth daily at bedtime. Sertraline has been chosen as this medication has very little interactions with other agents. Mirtazapine also was chosen as patient has been reporting decreased sleep and decreased appetite.  Cocaine and alcohol use: Patient has been educated about the adverse effects of these substances on his health. At discharge patient will be referred to a facility that offers substance abuse treatment along with mental health services.  Dyslipidemia continue Lipitor 80 mg a day.   End stage renal failure continued dialysis on Tuesday, Thursday and Saturday's. Continue calcitriol and PhosLo.  Patient had dialysis today w/o major issues  Hypertension continue coreg 25 mg by mouth twice a day, continue hydralazine  100 mg 3 times a day at home.   Congestive heart failure: continue lasix 80 mg  po bid by IM recommendations  GERD continue Pepcid 40 mg by mouth daily  Allergies continue Singulair 10 mg by mouth daily  Hospitalization and status: Voluntary admission  Precautions: continue every 15 minute checks   Discharge planning: Once a stable this patient will return to his home in [place] [facility] He will need to be set up with a mental health agency for follow-up.   Medical Decision Making:  Established Problem, Stable/Improving (1)     Hildred Priest 02/10/2015, 2:52 PM

## 2015-02-11 ENCOUNTER — Encounter: Payer: Self-pay | Admitting: Nephrology

## 2015-02-11 LAB — CHROMOSOME ANALYSIS, BONE MARROW

## 2015-02-11 LAB — GLUCOSE, CAPILLARY
Glucose-Capillary: 133 mg/dL — ABNORMAL HIGH (ref 65–99)
Glucose-Capillary: 189 mg/dL — ABNORMAL HIGH (ref 65–99)

## 2015-02-11 LAB — PARATHYROID HORMONE, INTACT (NO CA)

## 2015-02-11 LAB — TISSUE HYBRIDIZATION (BONE MARROW)-NCBH

## 2015-02-11 MED ORDER — LOSARTAN POTASSIUM 25 MG PO TABS: 25.0000 mg | ORAL_TABLET | Freq: Every day | ORAL | Status: DC

## 2015-02-11 MED ORDER — MIRTAZAPINE 15 MG PO TABS: 15.0000 mg | ORAL_TABLET | Freq: Every day | ORAL | Status: DC

## 2015-02-11 MED ORDER — SERTRALINE HCL 50 MG PO TABS: 50.0000 mg | ORAL_TABLET | Freq: Every day | ORAL | Status: DC

## 2015-02-11 MED ORDER — LOSARTAN POTASSIUM 50 MG PO TABS
25.0000 mg | ORAL_TABLET | Freq: Every day | ORAL | Status: DC
  Administered 2015-02-11: 25 mg via ORAL
  Filled 2015-02-11: qty 1

## 2015-02-11 MED ORDER — FUROSEMIDE 80 MG PO TABS
80.0000 mg | ORAL_TABLET | Freq: Two times a day (BID) | ORAL | Status: DC
Start: 2015-02-11 — End: 2015-03-17

## 2015-02-11 MED ORDER — FAMOTIDINE 20 MG PO TABS
20.0000 mg | ORAL_TABLET | Freq: Two times a day (BID) | ORAL | Status: DC
  Administered 2015-02-11: 20 mg via ORAL
  Filled 2015-02-11: qty 1

## 2015-02-11 MED ORDER — FAMOTIDINE 40 MG PO TABS: 40.0000 mg | ORAL_TABLET | Freq: Every day | ORAL | Status: DC

## 2015-02-11 NOTE — Progress Notes (Signed)
Patient denies SI/HI, denies A/V hallucinations. Patient verbalizes understanding of discharge instructions, follow up care and prescriptions. Patient given all belongings from personal locker. Patient escorted out by staff, transported home by family

## 2015-02-11 NOTE — Progress Notes (Signed)
Recreation Therapy Notes  INPATIENT RECREATION TR PLAN  Patient Details Name: [name] MRN: [id number] DOB: May 11, 1961 Today's Date: 02/11/2015  Rec Therapy Plan Is patient appropriate for Therapeutic Recreation?: Yes Treatment times per week: At least 2 times a week TR Treatment/Interventions: 1:1 session, Group participation (Comment) (Appropriate participation in daily recreation therapy tx)  Discharge Criteria Pt will be discharged from therapy if:: Discharged Treatment plan/goals/alternatives discussed and agreed upon by:: Patient/family  Discharge Summary Short term goals set: See Care Plan Short term goals met: Complete Progress toward goals comments: One-to-one attended Which groups?: Self-esteem One-to-one attended: Anger Management Reason goals not met: N/A Therapeutic equipment acquired: None Reason patient discharged from therapy: Discharge from hospital Pt/family agrees with progress & goals achieved: Yes Date patient discharged from therapy: 02/11/15   Leonette Monarch, LRT/CTRS 02/11/2015, 1:56 PM

## 2015-02-11 NOTE — BHH Suicide Risk Assessment (Signed)
[place] INPATIENT:  Family/Significant Other Suicide Prevention Education  Suicide Prevention Education:  Education Completed: Juanda Crumble (Friend), [phone] has been identified by the patient as the family member/significant other with whom the patient will be residing, and identified as the person(s) who will aid the patient in the event of a mental health crisis (suicidal ideations/suicide attempt).  With written consent from the patient, the family member/significant other has been provided the following suicide prevention education, prior to the and/or following the discharge of the patient.  The suicide prevention education provided includes the following:  Suicide risk factors  Suicide prevention and interventions  National Suicide Hotline telephone number  [facility] assessment telephone number  [facility] Emergency Assistance [place] and/or Residential Mobile Crisis Unit telephone number  Request made of family/significant other to:  Remove weapons (e.g., guns, rifles, knives), all items previously/currently identified as safety concern.    Remove drugs/medications (over-the-counter, prescriptions, illicit drugs), all items previously/currently identified as a safety concern.  The family member/significant other verbalizes understanding of the suicide prevention education information provided.  The family member/significant other agrees to remove the items of safety concern listed above.  Wray Kearns, MSW, LCSWA  02/11/2015, 11:06 AM

## 2015-02-11 NOTE — BHH Group Notes (Signed)
[facility] LCSW Aftercare Discharge Planning Group Note  02/11/2015 10:17 AM  Participation Quality:  did not attend group  Affect:  n/a  Cognitive:  n/a  Insight:  n/a  Engagement in Group:  n/a  Modes of Intervention:  n/a  Summary of Progress/Problems:  [name] T 02/11/2015, 10:17 AM
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
## 2015-02-11 NOTE — Plan of Care (Signed)
Problem: [facility] Participation in Recreation Therapeutic Interventions Goal: STG-Patient will identify at least five coping skills for ** STG: Coping Skills - Within 2 treatment sessions, patient will verbalize at least 5 coping skills for anger in one treatment session to increase anger management skills post d/c.  Outcome: Completed/Met Date Met:  02/11/15 Treatment Session 1; Completed 1 out of 1: At approximately 11:55 am, LRT met with patient in craft room. Patient verbalized 5 coping skills for anger. Patient verbalized what triggers him to get angry, how his body responds to anger, and how he is going to remember to use his healthy coping skills. LRT provided suggestions as well. 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 Patient reported treatment session was helpful.  Leonette [facility], LRT/CTRS 06.10.16 1:55 pm

## 2015-02-11 NOTE — Progress Notes (Signed)
AVS H&P Discharge Summary faxed to [facility] for hospital follow-up °

## 2015-02-11 NOTE — Plan of Care (Signed)
Problem: Alteration in mood Goal: LTG-Patient reports reduction in suicidal thoughts (Patient reports reduction in suicidal thoughts and is able to verbalize a safety plan for whenever patient is feeling suicidal)  Outcome: Progressing Reports no SI. Goal: LTG-Pt's behavior demonstrates decreased signs of depression (Patient's behavior demonstrates decreased signs of depression to the point the patient is safe to return home and continue treatment in an outpatient setting)  Outcome: Progressing Patient pleasant and cooperative. Affect is animated and patient is joking with this Probation officer. Talkative no distress noted.

## 2015-02-11 NOTE — Progress Notes (Signed)
D) Patient pleasant and cooperative upon my assessment. Patient did not complete Self Inventory Assessment however he  rates depression as  0 /10, patient rates hopeless feelings as 0 /10.  Patient denies SI/HI, denies A/V hallucinations.  Patient's affect is flat and mood is sad.  Reports that his appetite is good.   A) Patient offered support and encouragement, patient encouraged to discuss feelings/concerns with staff. Patient verbalized understanding. Patient monitored Q15 minutes for safety. Patient met with MD  to discuss today's goals and plan of care.  R) Patient visible in milieu, Patient is scheduled for discharge today. Patient appropriate with staff and peers.   Patient taking medications as ordered. Will continue to monitor.

## 2015-02-11 NOTE — Progress Notes (Signed)
  [facility] Adult Case Management Discharge Plan :  Will you be returning to the same living situation after discharge:  Yes,    At discharge, do you have transportation home?: Yes,  a friend Do you have the ability to pay for your medications: Yes,     Release of information consent forms completed and in the chart;  Patient's signature needed at discharge.  Patient to Follow up at: Follow-up Information    Follow up with [facility].   Specialty:  Behavioral Health   Why:  For your first appointment, you will need to go to the walk-in clinic. Monday -Friday 8:00-10:00. After your first appointment, you will be able to schedule future appointments.     Contact information:   [address] [id number] [phone]       Patient denies SI/HI: Yes,       Safety Planning and Suicide Prevention discussed: Yes,     Have you used any form of tobacco in the last 30 days? (Cigarettes, Smokeless Tobacco, Cigars, and/or Pipes): No  Has patient been referred to the Quitline?: N/A patient is not a smoker  August Saucer, LCSW 02/11/2015, 4:56 PM

## 2015-02-11 NOTE — BHH Group Notes (Signed)
[facility] Group Notes:  (Nursing/MHT/Case Management/Adjunct)  Date:  02/11/2015  Time:  1:00 PM  Type of Therapy:  Psychoeducational Skills  Participation Level:  Did Not Attend  [name] 02/11/2015, 1:00 PM

## 2015-02-16 ENCOUNTER — Telehealth: Payer: Self-pay | Admitting: *Deleted

## 2015-02-16 NOTE — Telephone Encounter (Signed)
-----   Message from [name], MD sent at 02/09/2015 12:59 PM EDT ----- Normal potassium Cr high but stable at 5.4

## 2015-02-16 NOTE — Telephone Encounter (Signed)
Pt aware of results 

## 2015-02-18 ENCOUNTER — Inpatient Hospital Stay: Payer: Self-pay | Admitting: Family Medicine

## 2015-02-18 ENCOUNTER — Telehealth: Payer: Self-pay

## 2015-02-18 NOTE — Telephone Encounter (Signed)
Cytogenetics rcvd from [facility]  Dtd 02/10/15.  Reviewed by Dr. Lindi Adie.  Sent to scan.

## 2015-02-22 ENCOUNTER — Encounter: Payer: Self-pay | Admitting: *Deleted

## 2015-02-22 NOTE — Progress Notes (Signed)
Received cytogenetic results from [facility], sent to scan.

## 2015-02-25 ENCOUNTER — Ambulatory Visit: Payer: Self-pay | Admitting: Hematology and Oncology

## 2015-02-25 ENCOUNTER — Encounter (HOSPITAL_COMMUNITY): Payer: Self-pay

## 2015-02-28 ENCOUNTER — Telehealth: Payer: Self-pay

## 2015-02-28 NOTE — Telephone Encounter (Signed)
Charting error.

## 2015-03-01 ENCOUNTER — Inpatient Hospital Stay: Payer: Self-pay | Admitting: Family Medicine

## 2015-03-11 ENCOUNTER — Telehealth: Payer: Self-pay

## 2015-03-11 NOTE — Telephone Encounter (Signed)
Call received from patient who indicates he needs to schedule an appointment with Dr. Adrian Blackwater. Call routed to Scheduler to schedule appointment with Dr. Adrian Blackwater.

## 2015-03-14 ENCOUNTER — Inpatient Hospital Stay (HOSPITAL_COMMUNITY)
Admission: EM | Admit: 2015-03-14 | Discharge: 2015-03-17 | DRG: 682 | Disposition: A | Discharge status: Home / Self Care | Payer: Medicaid Other | Attending: Internal Medicine | Admitting: Internal Medicine

## 2015-03-14 ENCOUNTER — Emergency Department (HOSPITAL_COMMUNITY): Payer: Medicaid Other

## 2015-03-14 ENCOUNTER — Encounter (HOSPITAL_COMMUNITY): Payer: Self-pay

## 2015-03-14 DIAGNOSIS — I5031 Acute diastolic (congestive) heart failure: Secondary | ICD-10-CM

## 2015-03-14 DIAGNOSIS — C9000 Multiple myeloma not having achieved remission: Secondary | ICD-10-CM | POA: Diagnosis present

## 2015-03-14 DIAGNOSIS — R0602 Shortness of breath: Secondary | ICD-10-CM | POA: Diagnosis present

## 2015-03-14 DIAGNOSIS — D472 Monoclonal gammopathy: Secondary | ICD-10-CM | POA: Diagnosis present

## 2015-03-14 DIAGNOSIS — E1129 Type 2 diabetes mellitus with other diabetic kidney complication: Secondary | ICD-10-CM

## 2015-03-14 DIAGNOSIS — D649 Anemia, unspecified: Secondary | ICD-10-CM | POA: Diagnosis present

## 2015-03-14 DIAGNOSIS — J189 Pneumonia, unspecified organism: Secondary | ICD-10-CM | POA: Diagnosis present

## 2015-03-14 DIAGNOSIS — Z9115 Patient's noncompliance with renal dialysis: Secondary | ICD-10-CM | POA: Diagnosis present

## 2015-03-14 DIAGNOSIS — E1122 Type 2 diabetes mellitus with diabetic chronic kidney disease: Secondary | ICD-10-CM | POA: Diagnosis present

## 2015-03-14 DIAGNOSIS — R0902 Hypoxemia: Secondary | ICD-10-CM | POA: Diagnosis present

## 2015-03-14 DIAGNOSIS — IMO0001 Reserved for inherently not codable concepts without codable children: Secondary | ICD-10-CM | POA: Diagnosis present

## 2015-03-14 DIAGNOSIS — F1410 Cocaine abuse, uncomplicated: Secondary | ICD-10-CM | POA: Diagnosis present

## 2015-03-14 DIAGNOSIS — I5032 Chronic diastolic (congestive) heart failure: Secondary | ICD-10-CM | POA: Diagnosis present

## 2015-03-14 DIAGNOSIS — I120 Hypertensive chronic kidney disease with stage 5 chronic kidney disease or end stage renal disease: Secondary | ICD-10-CM | POA: Diagnosis present

## 2015-03-14 DIAGNOSIS — K219 Gastro-esophageal reflux disease without esophagitis: Secondary | ICD-10-CM | POA: Diagnosis present

## 2015-03-14 DIAGNOSIS — F321 Major depressive disorder, single episode, moderate: Secondary | ICD-10-CM | POA: Diagnosis present

## 2015-03-14 DIAGNOSIS — E1165 Type 2 diabetes mellitus with hyperglycemia: Secondary | ICD-10-CM | POA: Diagnosis present

## 2015-03-14 DIAGNOSIS — F1590 Other stimulant use, unspecified, uncomplicated: Secondary | ICD-10-CM | POA: Diagnosis present

## 2015-03-14 DIAGNOSIS — Z794 Long term (current) use of insulin: Secondary | ICD-10-CM

## 2015-03-14 DIAGNOSIS — I471 Supraventricular tachycardia, unspecified: Secondary | ICD-10-CM | POA: Insufficient documentation

## 2015-03-14 DIAGNOSIS — N186 End stage renal disease: Principal | ICD-10-CM | POA: Diagnosis present

## 2015-03-14 DIAGNOSIS — I739 Peripheral vascular disease, unspecified: Secondary | ICD-10-CM | POA: Diagnosis present

## 2015-03-14 DIAGNOSIS — Z9114 Patient's other noncompliance with medication regimen: Secondary | ICD-10-CM | POA: Diagnosis present

## 2015-03-14 DIAGNOSIS — Z992 Dependence on renal dialysis: Secondary | ICD-10-CM

## 2015-03-14 DIAGNOSIS — Z7982 Long term (current) use of aspirin: Secondary | ICD-10-CM

## 2015-03-14 DIAGNOSIS — I5033 Acute on chronic diastolic (congestive) heart failure: Secondary | ICD-10-CM | POA: Diagnosis present

## 2015-03-14 DIAGNOSIS — E872 Acidosis: Secondary | ICD-10-CM | POA: Diagnosis present

## 2015-03-14 DIAGNOSIS — Z79899 Other long term (current) drug therapy: Secondary | ICD-10-CM

## 2015-03-14 LAB — RAPID URINE DRUG SCREEN, HOSP PERFORMED
AMPHETAMINES: NOT DETECTED
BARBITURATES: NOT DETECTED
Benzodiazepines: NOT DETECTED
Cocaine: POSITIVE — AB
OPIATES: NOT DETECTED
TETRAHYDROCANNABINOL: NOT DETECTED

## 2015-03-14 LAB — CBC
HEMATOCRIT: 26.3 % — AB (ref 39.0–52.0)
Hemoglobin: 8.7 g/dL — ABNORMAL LOW (ref 13.0–17.0)
MCH: 30.6 pg (ref 26.0–34.0)
MCHC: 33.1 g/dL (ref 30.0–36.0)
MCV: 92.6 fL (ref 78.0–100.0)
PLATELETS: 331 10*3/uL (ref 150–400)
RBC: 2.84 MIL/uL — ABNORMAL LOW (ref 4.22–5.81)
RDW: 16.6 % — ABNORMAL HIGH (ref 11.5–15.5)
WBC: 11.1 10*3/uL — ABNORMAL HIGH (ref 4.0–10.5)

## 2015-03-14 LAB — BASIC METABOLIC PANEL
Anion gap: 9 (ref 5–15)
BUN: 49 mg/dL — ABNORMAL HIGH (ref 6–20)
CALCIUM: 8.5 mg/dL — AB (ref 8.9–10.3)
CHLORIDE: 111 mmol/L (ref 101–111)
CO2: 13 mmol/L — AB (ref 22–32)
Creatinine, Ser: 5.07 mg/dL — ABNORMAL HIGH (ref 0.61–1.24)
GFR calc Af Amer: 14 mL/min — ABNORMAL LOW (ref >60)
GFR calc non Af Amer: 12 mL/min — ABNORMAL LOW (ref >60)
Glucose, Bld: 239 mg/dL — ABNORMAL HIGH (ref 65–99)
Potassium: 4.6 mmol/L (ref 3.5–5.1)
Sodium: 133 mmol/L — ABNORMAL LOW (ref 135–145)

## 2015-03-14 LAB — URINALYSIS, ROUTINE W REFLEX MICROSCOPIC
Bilirubin Urine: NEGATIVE
Glucose, UA: 500 mg/dL — AB
KETONES UR: NEGATIVE mg/dL
LEUKOCYTES UA: NEGATIVE
NITRITE: NEGATIVE
PH: 5.5 (ref 5.0–8.0)
Protein, ur: >300 mg/dL — AB
Specific Gravity, Urine: 1.011 (ref 1.005–1.030)
Urobilinogen, UA: 0.2 mg/dL (ref 0.0–1.0)

## 2015-03-14 LAB — I-STAT TROPONIN, ED: Troponin i, poc: 0.07 ng/mL (ref 0.00–0.08)

## 2015-03-14 LAB — BRAIN NATRIURETIC PEPTIDE: B Natriuretic Peptide: 2421.5 pg/mL — ABNORMAL HIGH (ref 0.0–100.0)

## 2015-03-14 LAB — URINE MICROSCOPIC-ADD ON

## 2015-03-14 MED ORDER — DEXTROSE 5 % IV SOLN
2.0000 g | INTRAVENOUS | Status: DC
  Filled 2015-03-14: qty 2

## 2015-03-14 MED ORDER — DEXTROSE 5 % IV SOLN
2.0000 g | Freq: Once | INTRAVENOUS | Status: AC
  Administered 2015-03-14: 2 g via INTRAVENOUS
  Filled 2015-03-14: qty 2

## 2015-03-14 MED ORDER — VANCOMYCIN HCL 10 G IV SOLR
1500.0000 mg | Freq: Once | INTRAVENOUS | Status: AC
  Administered 2015-03-15: 1500 mg via INTRAVENOUS
  Filled 2015-03-14: qty 1500

## 2015-03-14 NOTE — ED Notes (Signed)
Pt states that he has not taken none of his medications in over a month because he is not taking dialysis, states that his wife left him and he has no one to take care of his children.

## 2015-03-14 NOTE — ED Provider Notes (Signed)
The patient is a 54 year old male, history of end-stage renal disease, has not had dialysis in over one month, he states that he has had family issues with not having child care for his children thus he has not been able to go. He reports increased shortness of breath and tachypnea, he has had a cough with fever and diaphoresis intermittently, on exam he is tachypneic, has mild expiratory wheezing, bilateral lower extremity edema, x-ray shows possible infiltrate and effusion, renal failure, acidosis.  Medical screening examination/treatment/procedure(s) were conducted as a shared visit with non-physician practitioner(s) and myself.  I personally evaluated the patient during the encounter.  Clinical Impression:   Final diagnoses:  Acute diastolic CHF (congestive heart failure)  HCAP (healthcare-associated pneumonia)  Stimulant use disorder (cocaine)         Noemi Chapel, MD 03/15/15 [phone]

## 2015-03-14 NOTE — ED Provider Notes (Signed)
CSN: [id number]     Arrival date & time 03/14/15  2040 History   First MD Initiated Contact with Patient 03/14/15 2043     Chief Complaint  Patient presents with  . Chest Pain     (Consider location/radiation/quality/duration/timing/severity/associated sxs/prior Treatment) HPI Comments: Patient with h/o ESRD on dialysis, still produces urine, CHF -- presents with c/o progressively worsening SOB with exertion x 2 weeks, cough, left CP starting this morning. Patient has not had dialysis in approximately 4 weeks. CP is non-radiating. No associated nausea, palpitations, diaphoresis. Non-compliant with home medications. Mild LE edema. No new or worsening orthopnea. Denies fevers, has had chills. EMS was called and patient was given NTG and ASA. The onset of this condition was acute. The course is constant. Aggravating factors: activity. Alleviating factors: none.    The history is provided by the patient and medical records.    Past Medical History  Diagnosis Date  . PVD (peripheral vascular disease)   . Shortness of breath dyspnea   . Constipation   . Anemia     unsure  . CHF (congestive heart failure) 2015  . Diastolic congestive heart failure   . Diabetes mellitus 2015    type 2  . GERD (gastroesophageal reflux disease)     unsure  . Hypertension 2015  . Chronic renal disease, stage IV 2015  . Cocaine abuse   . Cellulitis and abscess of foot 08/24/2011  . Protein calorie malnutrition   . Headache   . Multiple myeloma    Past Surgical History  Procedure Laterality Date  . Leg surgery    . Knee surgery Right   . Amputation  09/06/2011    Procedure: AMPUTATION RAY;  Surgeon: Wylene Simmer, MD;  Location: [place];  Service: Orthopedics;  Laterality: Left;  Left Hallux Amputation  . Av fistula placement Left 08/12/2014    Procedure: CREATION OF A BRACHIOCEPHALIC ARTERIOVENOUS (AV) FISTULA  LEFT ARM;  Surgeon: Mal Misty, MD;  Location: [place];  Service: Vascular;  Laterality: Left;   . Av fistula placement Right 10/07/2014    Procedure: Creation Right Arm Arteriovenous fistula;  Surgeon: Mal Misty, MD;  Location: [facility];  Service: Vascular;  Laterality: Right;  . Appendectomy     Family History  Problem Relation Age of Onset  . Adopted: Yes  . Varicose Veins Mother   . Varicose Veins Sister    History  Substance Use Topics  . Smoking status: Never Smoker   . Smokeless tobacco: Never Used  . Alcohol Use: No    Review of Systems  Constitutional: Negative for fever.  HENT: Negative for rhinorrhea and sore throat.   Eyes: Negative for redness.  Respiratory: Positive for cough and shortness of breath.   Cardiovascular: Positive for chest pain and leg swelling.  Gastrointestinal: Negative for nausea, vomiting, abdominal pain and diarrhea.  Genitourinary: Negative for dysuria.  Musculoskeletal: Negative for myalgias.  Skin: Negative for rash.  Neurological: Negative for headaches.    Allergies  Review of patient's allergies indicates no known allergies.  Home Medications   Prior to Admission medications   Medication Sig Start Date End Date Taking? Authorizing Provider  aspirin 81 MG chewable tablet Chew 324 mg by mouth once.   Yes Historical Provider, MD  nitroGLYCERIN (NITROSTAT) 0.4 MG SL tablet Place 0.4 mg under the tongue every 5 (five) minutes as needed for chest pain.   Yes Historical Provider, MD  atorvastatin (LIPITOR) 80 MG tablet Take 1 tablet (80  mg total) by mouth daily at 6 PM. Patient not taking: Reported on 03/14/2015 02/03/15   Robbie Lis, MD  calcitRIOL (ROCALTROL) 0.25 MCG capsule Take 1 capsule (0.25 mcg total) by mouth daily. Patient not taking: Reported on 03/14/2015 11/05/14   [name], MD  calcium acetate (PHOSLO) 667 MG capsule Take 2 capsules (1,334 mg total) by mouth 3 (three) times daily with meals. Patient not taking: Reported on 03/14/2015 12/15/14   Renella Cunas, MD  carvedilol (COREG) 25 MG tablet Take 1 tablet (25 mg  total) by mouth 2 (two) times daily with a meal. Patient not taking: Reported on 03/14/2015 09/15/14   Renella Cunas, MD  famotidine (PEPCID) 20 MG tablet Take 40 mg by mouth 2 (two) times daily.    Historical Provider, MD  famotidine (PEPCID) 40 MG tablet Take 1 tablet (40 mg total) by mouth daily. 02/11/15   Hildred Priest, MD  FLUoxetine (PROZAC) 20 MG capsule Take 20 mg by mouth daily.    Historical Provider, MD  furosemide (LASIX) 80 MG tablet Take 1 tablet (80 mg total) by mouth 2 (two) times daily. Per renal Patient not taking: Reported on 03/14/2015 02/11/15   Hildred Priest, MD  hydrALAZINE (APRESOLINE) 100 MG tablet Take 1 tablet (100 mg total) by mouth 3 (three) times daily. Patient not taking: Reported on 03/14/2015 12/07/14   [name], MD  insulin aspart (NOVOLOG) 100 UNIT/ML injection Before each meal 3 times a day, 140-199 - 2 units, 200-250 - 4 units, 251-299 - 6 units,  300-349 - 8 units,  350 or above 10 units. Insulin PEN if approved, provide syringes and needles if needed. Can switch to any short-acting insulin. 12/01/14   Thurnell Lose, MD  losartan (COZAAR) 25 MG tablet Take 1 tablet (25 mg total) by mouth daily. Patient not taking: Reported on 03/14/2015 02/11/15   Hildred Priest, MD  mirtazapine (REMERON) 15 MG tablet Take 1 tablet (15 mg total) by mouth at bedtime. 02/11/15   Hildred Priest, MD  mirtazapine (REMERON) 30 MG tablet Take 30 mg by mouth at bedtime.    Historical Provider, MD  montelukast (SINGULAIR) 10 MG tablet Take 1 tablet (10 mg total) by mouth at bedtime. 12/01/14   Thurnell Lose, MD  sertraline (ZOLOFT) 50 MG tablet Take 1 tablet (50 mg total) by mouth at bedtime. 02/11/15   Hildred Priest, MD   Pulse 107  Temp(Src) 98.6 F (37 C)  Resp 29  Ht 5' 11"  (1.803 m)  Wt 181 lb (82.101 kg)  BMI 25.26 kg/m2  SpO2 97%   Physical Exam  Constitutional: He appears well-developed and well-nourished.   HENT:  Head: Normocephalic and atraumatic.  Mouth/Throat: Oropharynx is clear and moist.  Eyes: Conjunctivae are normal. Right eye exhibits no discharge. Left eye exhibits no discharge.  Neck: Normal range of motion. Neck supple. No JVD present.  Cardiovascular: Regular rhythm and normal heart sounds.  Tachycardia present.   No murmur heard. Pulmonary/Chest: Effort normal. No respiratory distress. He has wheezes (scattered, expiratory, worse at bases). He has no rales.  Abdominal: Soft. There is no tenderness. There is no rebound and no guarding.  Musculoskeletal: He exhibits edema. He exhibits no tenderness.  1+ pitting edema to mid-ankles.   RUE fistula with palpable thrill.  Neurological: He is alert.  Skin: Skin is warm and dry.  Psychiatric: He has a normal mood and affect.  Nursing note and vitals reviewed.   ED Course  Procedures (including  critical care time) Labs Review Labs Reviewed  BASIC METABOLIC PANEL - Abnormal; Notable for the following:    Sodium 133 (*)    CO2 13 (*)    Glucose, Bld 239 (*)    BUN 49 (*)    Creatinine, Ser 5.07 (*)    Calcium 8.5 (*)    GFR calc non Af Amer 12 (*)    GFR calc Af Amer 14 (*)    All other components within normal limits  CBC - Abnormal; Notable for the following:    WBC 11.1 (*)    RBC 2.84 (*)    Hemoglobin 8.7 (*)    HCT 26.3 (*)    RDW 16.6 (*)    All other components within normal limits  BRAIN NATRIURETIC PEPTIDE - Abnormal; Notable for the following:    B Natriuretic Peptide 2421.5 (*)    All other components within normal limits  URINALYSIS, ROUTINE W REFLEX MICROSCOPIC (NOT AT [facility]) - Abnormal; Notable for the following:    Glucose, UA 500 (*)    Hgb urine dipstick SMALL (*)    Protein, ur >300 (*)    All other components within normal limits  URINE RAPID DRUG SCREEN, HOSP PERFORMED - Abnormal; Notable for the following:    Cocaine POSITIVE (*)    All other components within normal limits  URINE  MICROSCOPIC-ADD ON - Abnormal; Notable for the following:    Casts GRANULAR CAST (*)    All other components within normal limits  I-STAT TROPOININ, ED    Imaging Review Dg Chest 2 View  03/14/2015   CLINICAL DATA:  Left-sided chest pain, cough, shortness of breath, sweating, nausea, dizziness, and diarrhea for 1 week. History of hypertension, diabetes, and CHF.  EXAM: CHEST  2 VIEW  COMPARISON:  01/26/2015  FINDINGS: Borderline heart size with normal pulmonary vascularity. Minimal blunting of the right costophrenic angle suggesting a small pleural effusion. Infiltration or atelectasis in the right lung base suggesting pneumonia or atelectasis. Left lung appears clear. Mediastinal contours are intact. No pneumothorax. Degenerative changes in the spine and shoulders.  IMPRESSION: Small right pleural effusion with infiltration or atelectasis in the right lung base.   Electronically Signed   By: Lucienne Capers M.D.   On: 03/14/2015 21:26     EKG Interpretation   Date/Time:  Monday March 14 2015 20:50:46 EDT Ventricular Rate:  108 PR Interval:  148 QRS Duration: 87 QT Interval:  343 QTC Calculation: 460 R Axis:   53 Text Interpretation:  Sinus tachycardia Since last tracing criteria for  LVH are no longer seen Abnormal ekg Confirmed by MILLER  MD, BRIAN ([id number])  on 03/14/2015 8:54:07 PM       9:05 PM Patient seen and examined. Work-up initiated. EKG reviewed. No acute signs of MI or hyperkalemia.   Vital signs reviewed and are as follows: Pulse 107  Temp(Src) 98.6 F (37 C)  Resp 29  Ht 5' 11"  (1.803 m)  Wt 181 lb (82.101 kg)  BMI 25.26 kg/m2  SpO2 97%  11:39 PM CXR with possible PNA, will treat. Patient ambulated and maintained saturation but became very short of breath.   Patient discussed with and seen by Dr. Sabra Heck. Will admit.   12:00 AM Spoke with Dr. Alcario Drought who will see.    MDM   Final diagnoses:  Acute diastolic CHF (congestive heart failure)  HCAP  (healthcare-associated pneumonia)  Stimulant use disorder (cocaine)   Admit.     Carlisle Cater, PA-C 03/15/15  0001  Noemi Chapel, MD 03/15/15 [phone]

## 2015-03-14 NOTE — ED Notes (Addendum)
Pt comes from home via [facility], c/o CP and SOB, SOB X 2 weeks , chest pressure started this AM, subsided and then came back around 3pm. No radiation, some nausea, no vomiting, c/o diaphoresis. Started dialysis several months ago, has not been since first week of June. Non complaint with meds. PTA received 2 nitro and 324 ASA

## 2015-03-14 NOTE — ED Notes (Signed)
Noted labored breathing throughout ambulation, pt became winded after approximately 20 paces, O2 level remained constant at 98-99% notwithstanding labored breathing.

## 2015-03-15 ENCOUNTER — Encounter (HOSPITAL_COMMUNITY): Payer: Self-pay | Admitting: *Deleted

## 2015-03-15 DIAGNOSIS — K219 Gastro-esophageal reflux disease without esophagitis: Secondary | ICD-10-CM | POA: Diagnosis present

## 2015-03-15 DIAGNOSIS — D649 Anemia, unspecified: Secondary | ICD-10-CM | POA: Diagnosis present

## 2015-03-15 DIAGNOSIS — N186 End stage renal disease: Secondary | ICD-10-CM | POA: Diagnosis present

## 2015-03-15 DIAGNOSIS — Z7982 Long term (current) use of aspirin: Secondary | ICD-10-CM | POA: Diagnosis not present

## 2015-03-15 DIAGNOSIS — I120 Hypertensive chronic kidney disease with stage 5 chronic kidney disease or end stage renal disease: Secondary | ICD-10-CM | POA: Diagnosis present

## 2015-03-15 DIAGNOSIS — F1410 Cocaine abuse, uncomplicated: Secondary | ICD-10-CM | POA: Diagnosis present

## 2015-03-15 DIAGNOSIS — E1129 Type 2 diabetes mellitus with other diabetic kidney complication: Secondary | ICD-10-CM | POA: Diagnosis not present

## 2015-03-15 DIAGNOSIS — Z79899 Other long term (current) drug therapy: Secondary | ICD-10-CM | POA: Diagnosis not present

## 2015-03-15 DIAGNOSIS — E1165 Type 2 diabetes mellitus with hyperglycemia: Secondary | ICD-10-CM | POA: Diagnosis present

## 2015-03-15 DIAGNOSIS — Z9114 Patient's other noncompliance with medication regimen: Secondary | ICD-10-CM | POA: Diagnosis present

## 2015-03-15 DIAGNOSIS — Z794 Long term (current) use of insulin: Secondary | ICD-10-CM | POA: Diagnosis not present

## 2015-03-15 DIAGNOSIS — C9000 Multiple myeloma not having achieved remission: Secondary | ICD-10-CM | POA: Diagnosis present

## 2015-03-15 DIAGNOSIS — D472 Monoclonal gammopathy: Secondary | ICD-10-CM | POA: Diagnosis present

## 2015-03-15 DIAGNOSIS — J189 Pneumonia, unspecified organism: Secondary | ICD-10-CM | POA: Diagnosis present

## 2015-03-15 DIAGNOSIS — F321 Major depressive disorder, single episode, moderate: Secondary | ICD-10-CM | POA: Diagnosis present

## 2015-03-15 DIAGNOSIS — R0902 Hypoxemia: Secondary | ICD-10-CM | POA: Diagnosis present

## 2015-03-15 DIAGNOSIS — Z9115 Patient's noncompliance with renal dialysis: Secondary | ICD-10-CM | POA: Diagnosis present

## 2015-03-15 DIAGNOSIS — R0602 Shortness of breath: Secondary | ICD-10-CM | POA: Diagnosis present

## 2015-03-15 DIAGNOSIS — R079 Chest pain, unspecified: Secondary | ICD-10-CM | POA: Diagnosis not present

## 2015-03-15 DIAGNOSIS — Z992 Dependence on renal dialysis: Secondary | ICD-10-CM

## 2015-03-15 DIAGNOSIS — I739 Peripheral vascular disease, unspecified: Secondary | ICD-10-CM | POA: Diagnosis present

## 2015-03-15 DIAGNOSIS — I5033 Acute on chronic diastolic (congestive) heart failure: Secondary | ICD-10-CM | POA: Diagnosis present

## 2015-03-15 DIAGNOSIS — F1599 Other stimulant use, unspecified with unspecified stimulant-induced disorder: Secondary | ICD-10-CM

## 2015-03-15 DIAGNOSIS — I5031 Acute diastolic (congestive) heart failure: Secondary | ICD-10-CM | POA: Diagnosis not present

## 2015-03-15 DIAGNOSIS — E1122 Type 2 diabetes mellitus with diabetic chronic kidney disease: Secondary | ICD-10-CM | POA: Diagnosis present

## 2015-03-15 DIAGNOSIS — I471 Supraventricular tachycardia: Secondary | ICD-10-CM | POA: Diagnosis not present

## 2015-03-15 DIAGNOSIS — R0600 Dyspnea, unspecified: Secondary | ICD-10-CM | POA: Diagnosis not present

## 2015-03-15 DIAGNOSIS — E872 Acidosis: Secondary | ICD-10-CM | POA: Diagnosis present

## 2015-03-15 LAB — BASIC METABOLIC PANEL
ANION GAP: 11 (ref 5–15)
BUN: 47 mg/dL — AB (ref 6–20)
CHLORIDE: 114 mmol/L — AB (ref 101–111)
CO2: 12 mmol/L — AB (ref 22–32)
Calcium: 8.8 mg/dL — ABNORMAL LOW (ref 8.9–10.3)
Creatinine, Ser: 4.76 mg/dL — ABNORMAL HIGH (ref 0.61–1.24)
GFR calc non Af Amer: 13 mL/min — ABNORMAL LOW (ref >60)
GFR, EST AFRICAN AMERICAN: 15 mL/min — AB (ref >60)
GLUCOSE: 121 mg/dL — AB (ref 65–99)
Potassium: 4.2 mmol/L (ref 3.5–5.1)
SODIUM: 137 mmol/L (ref 135–145)

## 2015-03-15 LAB — TROPONIN I: TROPONIN I: 0.09 ng/mL — AB (ref <0.031)

## 2015-03-15 LAB — GLUCOSE, CAPILLARY
GLUCOSE-CAPILLARY: 109 mg/dL — AB (ref 65–99)
GLUCOSE-CAPILLARY: 145 mg/dL — AB (ref 65–99)
GLUCOSE-CAPILLARY: 196 mg/dL — AB (ref 65–99)
Glucose-Capillary: 94 mg/dL (ref 65–99)

## 2015-03-15 LAB — HEPATITIS B SURFACE ANTIGEN: Hepatitis B Surface Ag: NEGATIVE

## 2015-03-15 MED ORDER — CARVEDILOL 25 MG PO TABS: 25.0000 mg | ORAL_TABLET | Freq: Two times a day (BID) | ORAL | Status: DC

## 2015-03-15 MED ORDER — LIDOCAINE-PRILOCAINE 2.5-2.5 % EX CREA
1.0000 "application " | TOPICAL_CREAM | CUTANEOUS | Status: DC | PRN
  Filled 2015-03-15: qty 5

## 2015-03-15 MED ORDER — ZOLPIDEM TARTRATE 5 MG PO TABS
5.0000 mg | ORAL_TABLET | Freq: Once | ORAL | Status: AC
  Administered 2015-03-15: 5 mg via ORAL
  Filled 2015-03-15: qty 1

## 2015-03-15 MED ORDER — FAMOTIDINE 20 MG PO TABS
20.0000 mg | ORAL_TABLET | Freq: Two times a day (BID) | ORAL | Status: DC
  Administered 2015-03-15 – 2015-03-16 (×3): 20 mg via ORAL
  Filled 2015-03-15 (×4): qty 1

## 2015-03-15 MED ORDER — SODIUM CHLORIDE 0.9 % IV SOLN: 100.0000 mL | INTRAVENOUS | Status: DC | PRN

## 2015-03-15 MED ORDER — DARBEPOETIN ALFA 100 MCG/0.5ML IJ SOSY
PREFILLED_SYRINGE | INTRAMUSCULAR | Status: AC
  Administered 2015-03-15: 100 ug via INTRAVENOUS
  Filled 2015-03-15: qty 0.5

## 2015-03-15 MED ORDER — HEPARIN SODIUM (PORCINE) 1000 UNIT/ML DIALYSIS: 1000.0000 [IU] | INTRAMUSCULAR | Status: DC | PRN

## 2015-03-15 MED ORDER — MORPHINE SULFATE 4 MG/ML IJ SOLN
4.0000 mg | Freq: Once | INTRAMUSCULAR | Status: AC
  Administered 2015-03-15: 4 mg via INTRAVENOUS
  Filled 2015-03-15: qty 1

## 2015-03-15 MED ORDER — HEPARIN SODIUM (PORCINE) 5000 UNIT/ML IJ SOLN
5000.0000 [IU] | Freq: Three times a day (TID) | INTRAMUSCULAR | Status: DC
  Administered 2015-03-15 – 2015-03-17 (×5): 5000 [IU] via SUBCUTANEOUS
  Filled 2015-03-15 (×6): qty 1

## 2015-03-15 MED ORDER — CALCITRIOL 0.25 MCG PO CAPS: 0.2500 ug | ORAL_CAPSULE | Freq: Every day | ORAL | Status: DC

## 2015-03-15 MED ORDER — ALTEPLASE 2 MG IJ SOLR
2.0000 mg | Freq: Once | INTRAMUSCULAR | Status: DC | PRN
  Filled 2015-03-15: qty 2

## 2015-03-15 MED ORDER — PENTAFLUOROPROP-TETRAFLUOROETH EX AERO: 1.0000 "application " | INHALATION_SPRAY | CUTANEOUS | Status: DC | PRN

## 2015-03-15 MED ORDER — FLUOXETINE HCL 20 MG PO CAPS
20.0000 mg | ORAL_CAPSULE | Freq: Every day | ORAL | Status: DC
  Administered 2015-03-15 – 2015-03-16 (×2): 20 mg via ORAL
  Filled 2015-03-15 (×2): qty 1

## 2015-03-15 MED ORDER — NITROGLYCERIN 0.4 MG SL SUBL
0.4000 mg | SUBLINGUAL_TABLET | SUBLINGUAL | Status: DC | PRN
  Administered 2015-03-15 (×2): 0.4 mg via SUBLINGUAL
  Filled 2015-03-15 (×3): qty 1

## 2015-03-15 MED ORDER — HEPARIN SODIUM (PORCINE) 1000 UNIT/ML DIALYSIS: 3000.0000 [IU] | INTRAMUSCULAR | Status: DC | PRN

## 2015-03-15 MED ORDER — LIDOCAINE HCL (PF) 1 % IJ SOLN
5.0000 mL | INTRAMUSCULAR | Status: DC | PRN
Start: 2015-03-15 — End: 2015-03-15

## 2015-03-15 MED ORDER — FAMOTIDINE 20 MG PO TABS
40.0000 mg | ORAL_TABLET | Freq: Two times a day (BID) | ORAL | Status: DC
  Administered 2015-03-15 (×2): 40 mg via ORAL
  Filled 2015-03-15 (×2): qty 2

## 2015-03-15 MED ORDER — INSULIN ASPART 100 UNIT/ML ~~LOC~~ SOLN
0.0000 [IU] | Freq: Three times a day (TID) | SUBCUTANEOUS | Status: DC
  Administered 2015-03-15 – 2015-03-16 (×2): 1 [IU] via SUBCUTANEOUS
  Administered 2015-03-17: 3 [IU] via SUBCUTANEOUS
  Administered 2015-03-17: 2 [IU] via SUBCUTANEOUS

## 2015-03-15 MED ORDER — MIRTAZAPINE 30 MG PO TABS
30.0000 mg | ORAL_TABLET | Freq: Every day | ORAL | Status: DC
  Administered 2015-03-15 – 2015-03-16 (×3): 30 mg via ORAL
  Filled 2015-03-15 (×4): qty 1

## 2015-03-15 MED ORDER — FUROSEMIDE 80 MG PO TABS
80.0000 mg | ORAL_TABLET | Freq: Two times a day (BID) | ORAL | Status: DC
  Administered 2015-03-15 – 2015-03-17 (×2): 80 mg via ORAL
  Filled 2015-03-15 (×3): qty 1

## 2015-03-15 MED ORDER — PENTAFLUOROPROP-TETRAFLUOROETH EX AERO
1.0000 | INHALATION_SPRAY | CUTANEOUS | Status: DC | PRN
Start: 2015-03-15 — End: 2015-03-15

## 2015-03-15 MED ORDER — SODIUM CHLORIDE 0.9 % IV SOLN
62.5000 mg | INTRAVENOUS | Status: DC
  Administered 2015-03-15 – 2015-03-17 (×2): 62.5 mg via INTRAVENOUS
  Filled 2015-03-15 (×2): qty 5

## 2015-03-15 MED ORDER — SODIUM CHLORIDE 0.9 % IV SOLN
100.0000 mL | INTRAVENOUS | Status: DC | PRN
Start: 2015-03-15 — End: 2015-03-15

## 2015-03-15 MED ORDER — CALCITRIOL 0.5 MCG PO CAPS
0.5000 ug | ORAL_CAPSULE | ORAL | Status: DC
  Administered 2015-03-15: 0.5 ug via ORAL

## 2015-03-15 MED ORDER — FUROSEMIDE 10 MG/ML IJ SOLN: 40.0000 mg | Freq: Once | INTRAMUSCULAR | Status: DC

## 2015-03-15 MED ORDER — HEPARIN SODIUM (PORCINE) 1000 UNIT/ML DIALYSIS: 20.0000 [IU]/kg | INTRAMUSCULAR | Status: DC | PRN

## 2015-03-15 MED ORDER — DARBEPOETIN ALFA 100 MCG/0.5ML IJ SOSY
100.0000 ug | PREFILLED_SYRINGE | INTRAMUSCULAR | Status: DC
  Administered 2015-03-15: 100 ug via INTRAVENOUS
  Filled 2015-03-15: qty 0.5

## 2015-03-15 MED ORDER — LIDOCAINE-PRILOCAINE 2.5-2.5 % EX CREA: 1.0000 "application " | TOPICAL_CREAM | CUTANEOUS | Status: DC | PRN

## 2015-03-15 MED ORDER — CALCIUM ACETATE (PHOS BINDER) 667 MG PO CAPS
1334.0000 mg | ORAL_CAPSULE | Freq: Three times a day (TID) | ORAL | Status: DC
  Administered 2015-03-15 – 2015-03-17 (×5): 1334 mg via ORAL
  Filled 2015-03-15 (×10): qty 2

## 2015-03-15 MED ORDER — NITROGLYCERIN 2 % TD OINT
1.0000 [in_us] | TOPICAL_OINTMENT | Freq: Four times a day (QID) | TRANSDERMAL | Status: DC
  Administered 2015-03-15 – 2015-03-16 (×3): 1 [in_us] via TOPICAL
  Filled 2015-03-15 (×2): qty 30

## 2015-03-15 MED ORDER — ALTEPLASE 2 MG IJ SOLR: 2.0000 mg | Freq: Once | INTRAMUSCULAR | Status: DC | PRN

## 2015-03-15 MED ORDER — CALCITRIOL 0.5 MCG PO CAPS
ORAL_CAPSULE | ORAL | Status: AC
  Administered 2015-03-15: 0.5 ug via ORAL
  Filled 2015-03-15: qty 1

## 2015-03-15 MED ORDER — NEPRO/CARBSTEADY PO LIQD
237.0000 mL | ORAL | Status: DC | PRN
  Filled 2015-03-15: qty 237

## 2015-03-15 MED ORDER — LOSARTAN POTASSIUM 25 MG PO TABS: 25.0000 mg | ORAL_TABLET | Freq: Every day | ORAL | Status: DC

## 2015-03-15 MED ORDER — ASPIRIN 81 MG PO CHEW
324.0000 mg | CHEWABLE_TABLET | Freq: Once | ORAL | Status: AC
  Administered 2015-03-15: 324 mg via ORAL
  Filled 2015-03-15: qty 4

## 2015-03-15 MED ORDER — HYDRALAZINE HCL 50 MG PO TABS
100.0000 mg | ORAL_TABLET | Freq: Three times a day (TID) | ORAL | Status: DC
  Administered 2015-03-15 – 2015-03-17 (×5): 100 mg via ORAL
  Filled 2015-03-15: qty 2
  Filled 2015-03-15: qty 4
  Filled 2015-03-15: qty 2
  Filled 2015-03-15: qty 4
  Filled 2015-03-15 (×2): qty 2

## 2015-03-15 MED ORDER — NEPRO/CARBSTEADY PO LIQD: 237.0000 mL | ORAL | Status: DC | PRN

## 2015-03-15 MED ORDER — LIDOCAINE HCL (PF) 1 % IJ SOLN: 5.0000 mL | INTRAMUSCULAR | Status: DC | PRN

## 2015-03-15 MED ORDER — ATORVASTATIN CALCIUM 80 MG PO TABS
80.0000 mg | ORAL_TABLET | Freq: Every day | ORAL | Status: DC
  Administered 2015-03-15 – 2015-03-17 (×2): 80 mg via ORAL
  Filled 2015-03-15 (×2): qty 1

## 2015-03-15 NOTE — Progress Notes (Signed)
12:10 PM I agree with HPI/GPe and A/P per Dr. Jennette Kettle      54 y/o hispanic ? known ESRD, prior SI with Auditory hallucinations s/p Acute in-patient psychiatry Hopsitalization.  Worked up Dr. Lindi Adie 08/2014 ?[facility]-last cytology done 6/1 shows monoclonalplasmacytosis, Chr Sys HF last EF55%, DM ty II, AoRD/AoChronisc disease  It appears from the chart he has had numerous psychosocial stressors and has not been able to get to dialysis nor get his usual care He tells me he has not used cocaine in over a month however his toxicology screen is positive for this on admission 7/11  He is originally from [place] and [place] and then moved from [place] where he was staying before to [place] about 6 years ago.  He is currently not short of breath and is tolerating a diabetic diet  HEENT alert pleasant oriented no apparent distress CHEST clear no added sound CARDIAC S1-S2 slightly tachycardic no murmur rub or gallop ABDOMEN soft nontender nondistended no rebound NEURO intact SKIN/MUSCULAR range of motion intact  Patient Active Problem List   Diagnosis Date Noted  . Shortness of breath 03/15/2015  . ESRD (end stage renal disease) 03/15/2015  . Major depressive disorder, single episode, moderate 02/09/2015  . Stimulant use disorder (cocaine) 02/09/2015  . Alcohol use disorder, moderate, dependence 02/09/2015  . Acute diastolic CHF (congestive heart failure)   . Chest pain 01/26/2015  . IgA monoclonal gammopathy 12/28/2014  . GERD (gastroesophageal reflux disease) 11/08/2014  . CKD (chronic kidney disease), stage IV 09/08/2013  . DM (diabetes mellitus), type 2, uncontrolled, with renal complications [id number]/[id number]  . Hypertension 08/27/2011  . Anemia of chronic renal failure, stage 4 (severe) 08/27/2011   As per history of present illness He will need dialysis as per nephrology he has functional fisutla He will also probably need follow-up with oncology regarding options for  his care We did not today address his substance abuse disorder nor his noncompliance and this will need to be addressed prior to discharge  Verneita Griffes, MD [facility] ([phone]

## 2015-03-15 NOTE — H&P (Signed)
[facility] History and Physical  [name] ZOX:[id number] DOB: [birth year] DOA: 03/14/2015  Referring physician: EDP PCP: Minerva Ends, MD   Chief Complaint: DOE   HPI: [name] is a 53 y.o. male with h/o ESRD, started dialysis last month during admission at the beginning of month (see notes from that admit for more details).  After being discharged, patient basically stopped all medications and has not been to dialysis for a month now.  During this time he has become slowly but progressively more short of breath, had worsening peripheral edema, developed orthopnea.  SOB is worse with exertion, he now has chest tightness / pain with exertion.  Review of Systems: Systems reviewed.  As above, otherwise negative  Past Medical History  Diagnosis Date  . PVD (peripheral vascular disease)   . Shortness of breath dyspnea   . Constipation   . Anemia     unsure  . CHF (congestive heart failure) 2015  . Diastolic congestive heart failure   . Diabetes mellitus 2015    type 2  . GERD (gastroesophageal reflux disease)     unsure  . Hypertension 2015  . Chronic renal disease, stage IV 2015  . Cocaine abuse   . Cellulitis and abscess of foot 08/24/2011  . Protein calorie malnutrition   . Headache   . Multiple myeloma    Past Surgical History  Procedure Laterality Date  . Leg surgery    . Knee surgery Right   . Amputation  09/06/2011    Procedure: AMPUTATION RAY;  Surgeon: Wylene Simmer, MD;  Location: [place];  Service: Orthopedics;  Laterality: Left;  Left Hallux Amputation  . Av fistula placement Left 08/12/2014    Procedure: CREATION OF A BRACHIOCEPHALIC ARTERIOVENOUS (AV) FISTULA  LEFT ARM;  Surgeon: Mal Misty, MD;  Location: [place];  Service: Vascular;  Laterality: Left;  . Av fistula placement Right 10/07/2014    Procedure: Creation Right Arm Arteriovenous fistula;  Surgeon: Mal Misty, MD;  Location: [place];  Service: Vascular;  Laterality:  Right;  . Appendectomy     Social History:  reports that he has never smoked. He has never used smokeless tobacco. He reports that he uses illicit drugs (Cocaine and Marijuana). He reports that he does not drink alcohol.  No Known Allergies  Family History  Problem Relation Age of Onset  . Adopted: Yes  . Varicose Veins Mother   . Varicose Veins Sister      Prior to Admission medications   Medication Sig Start Date End Date Taking? Authorizing Provider  aspirin 81 MG chewable tablet Chew 324 mg by mouth once.   Yes Historical Provider, MD  nitroGLYCERIN (NITROSTAT) 0.4 MG SL tablet Place 0.4 mg under the tongue every 5 (five) minutes as needed for chest pain.   Yes Historical Provider, MD  atorvastatin (LIPITOR) 80 MG tablet Take 1 tablet (80 mg total) by mouth daily at 6 PM. Patient not taking: Reported on 03/14/2015 02/03/15   Robbie Lis, MD  calcitRIOL (ROCALTROL) 0.25 MCG capsule Take 1 capsule (0.25 mcg total) by mouth daily. Patient not taking: Reported on 03/14/2015 11/05/14   [name], MD  calcium acetate (PHOSLO) 667 MG capsule Take 2 capsules (1,334 mg total) by mouth 3 (three) times daily with meals. Patient not taking: Reported on 03/14/2015 12/15/14   Renella Cunas, MD  carvedilol (COREG) 25 MG tablet Take 1 tablet (25 mg total) by mouth 2 (two) times daily with a meal.  Patient not taking: Reported on 03/14/2015 09/15/14   Renella Cunas, MD  famotidine (PEPCID) 20 MG tablet Take 40 mg by mouth 2 (two) times daily.    Historical Provider, MD  FLUoxetine (PROZAC) 20 MG capsule Take 20 mg by mouth daily.    Historical Provider, MD  furosemide (LASIX) 80 MG tablet Take 1 tablet (80 mg total) by mouth 2 (two) times daily. Per renal Patient not taking: Reported on 03/14/2015 02/11/15   Hildred Priest, MD  hydrALAZINE (APRESOLINE) 100 MG tablet Take 1 tablet (100 mg total) by mouth 3 (three) times daily. Patient not taking: Reported on 03/14/2015 12/07/14   [name],  MD  insulin aspart (NOVOLOG) 100 UNIT/ML injection Before each meal 3 times a day, 140-199 - 2 units, 200-250 - 4 units, 251-299 - 6 units,  300-349 - 8 units,  350 or above 10 units. Insulin PEN if approved, provide syringes and needles if needed. Can switch to any short-acting insulin. 12/01/14   Thurnell Lose, MD  losartan (COZAAR) 25 MG tablet Take 1 tablet (25 mg total) by mouth daily. Patient not taking: Reported on 03/14/2015 02/11/15   Hildred Priest, MD  mirtazapine (REMERON) 30 MG tablet Take 30 mg by mouth at bedtime.    Historical Provider, MD   Physical Exam: Filed Vitals:   03/15/15 0023  BP: 192/117  Pulse: 106  Temp:   Resp: 24    BP 192/117 mmHg  Pulse 106  Temp(Src) 98.6 F (37 C)  Resp 24  Ht 5' 11"  (1.803 m)  Wt 82.101 kg (181 lb)  BMI 25.26 kg/m2  SpO2 100%  General Appearance:    Alert, oriented, no distress, appears stated age  Head:    Normocephalic, atraumatic  Eyes:    PERRL, EOMI, sclera non-icteric        Nose:   Nares without drainage or epistaxis. Mucosa, turbinates normal  Throat:   Moist mucous membranes. Oropharynx without erythema or exudate.  Neck:   Supple. No carotid bruits.  No thyromegaly.  No lymphadenopathy. Patient has JVD and venous distention all the way up to his temples bilaterally.  Back:     No CVA tenderness, no spinal tenderness  Lungs:     Scattered wheezes.  Chest wall:    No tenderness to palpitation  Heart:    Regular rate and rhythm without murmurs, gallops, rubs  Abdomen:     Soft, non-tender, nondistended, normal bowel sounds, no organomegaly  Genitalia:    deferred  Rectal:    deferred  Extremities:   2+ pitting edema  Pulses:   2+ and symmetric all extremities  Skin:   Skin color, texture, turgor normal, no rashes or lesions  Lymph nodes:   Cervical, supraclavicular, and axillary nodes normal  Neurologic:   CNII-XII intact. Normal strength, sensation and reflexes      throughout    Labs on Admission:   Basic Metabolic Panel:  Recent Labs Lab 03/14/15 2107  NA 133*  K 4.6  CL 111  CO2 13*  GLUCOSE 239*  BUN 49*  CREATININE 5.07*  CALCIUM 8.5*   Liver Function Tests: No results for input(s): AST, ALT, ALKPHOS, BILITOT, PROT, ALBUMIN in the last 168 hours. No results for input(s): LIPASE, AMYLASE in the last 168 hours. No results for input(s): AMMONIA in the last 168 hours. CBC:  Recent Labs Lab 03/14/15 2107  WBC 11.1*  HGB 8.7*  HCT 26.3*  MCV 92.6  PLT 331   Cardiac  Enzymes: No results for input(s): CKTOTAL, CKMB, CKMBINDEX, TROPONINI in the last 168 hours.  BNP (last 3 results)  Recent Labs  08/04/14 1007  PROBNP 52226.0*   CBG: No results for input(s): GLUCAP in the last 168 hours.  Radiological Exams on Admission: Dg Chest 2 View  03/14/2015   CLINICAL DATA:  Left-sided chest pain, cough, shortness of breath, sweating, nausea, dizziness, and diarrhea for 1 week. History of hypertension, diabetes, and CHF.  EXAM: CHEST  2 VIEW  COMPARISON:  01/26/2015  FINDINGS: Borderline heart size with normal pulmonary vascularity. Minimal blunting of the right costophrenic angle suggesting a small pleural effusion. Infiltration or atelectasis in the right lung base suggesting pneumonia or atelectasis. Left lung appears clear. Mediastinal contours are intact. No pneumothorax. Degenerative changes in the spine and shoulders.  IMPRESSION: Small right pleural effusion with infiltration or atelectasis in the right lung base.   Electronically Signed   By: Lucienne Capers M.D.   On: 03/14/2015 21:26    EKG: Independently reviewed.  Assessment/Plan Principal Problem:   ESRD (end stage renal disease) Active Problems:   DM (diabetes mellitus), type 2, uncontrolled, with renal complications   IgA monoclonal gammopathy   Acute diastolic CHF (congestive heart failure)   Stimulant use disorder (cocaine)   Shortness of breath   1. Acute diastolic CHF due to missing dialysis in  setting of ESRD - mild fluid overload, no pulmonary edema on CXR 1. Called nephrology consult line, patient likely needs dialysis in AM 2. Will attempt BP control by resuming all his home BP meds 1. Except coreg (due to UDS being positive for cocaine) 3. Add PRN BP meds if needed 2. NAG metabolic acidosis - due most likely to RTA 1. Current plan is to correct with dialysis 2. Likely chronically developed over past month, will hold off on ordering new PO bicarb until nephrology can evaluate patient in AM. 3. Will hold off on ordering bicarb gtt due to patient being stable at the moment and dont want to give him more volume given issue #1 above 3. DM - SSI AC/HS sensitive scale   Code Status: Full Code  Family Communication: No family in room Disposition Plan: Admit to obs   Time spent: 70 min  Marvelous Woolford M. [facility] Pager [phone]  If 7AM-7PM, please contact the day team taking care of the patient [email or web address] Password [facility] 03/15/2015, 12:56 AM

## 2015-03-15 NOTE — Progress Notes (Signed)
Per patient felt like nasal cannula was making his nose run so patient took it off.  Oxygen saturation in the upper 90's on room air.

## 2015-03-15 NOTE — Progress Notes (Signed)
Patient complain of sudden onset left sided chest pain, constant stabbing per patient.  2L [place] 02 applied, EKG obtained.  Sublingual nitroglycerin given x2 without any relief.  [facility] p[facility].  Raliegh Ip Schorr returned page and was updated of all information in this note.  Orders received.

## 2015-03-15 NOTE — Consult Note (Signed)
Renal Service Consult Note [facility]  [name] [birth year] [name] Requesting Physician: Dr [name]  Reason for Consult:  ESRD pt missed 4wks of HD HPI: The patient is a 54 y.o. year-old with hx of HTN, DM2, CKD started on HD early June '16.  He says his wife/partner left him with his two daughters 5 and 55 yrs old and he couldn't go to dialysis because he didn't have someone to watch his kids.  Comes in with SOB, orthopnea, fatigue, dry heaves.  No fevers, chills. Has AVF R arm, was on TTS schedule at [place] po meds for DM2 and BP meds.  Has hx toe amp, prior drug abuse per chart.    ROS  no HA  no blurred vision  no rash  no cough/ CP   no fever/chills  no abd pain  no foot ulcers or wounds  Past Medical History  Past Medical History  Diagnosis Date  . PVD (peripheral vascular disease)   . Shortness of breath dyspnea   . Constipation   . CHF (congestive heart failure) 2015  . Diastolic congestive heart failure   . Diabetes mellitus 2015    type 2  . GERD (gastroesophageal reflux disease)     unsure  . Hypertension 2015  . Chronic renal disease, stage IV 2015  . Cocaine abuse   . Cellulitis and abscess of foot 08/24/2011  . Protein calorie malnutrition   . Headache   . Multiple myeloma   . Anemia    Past Surgical History  Past Surgical History  Procedure Laterality Date  . Leg surgery    . Knee surgery Right     "metal plate and screws" per patient   . Amputation  09/06/2011    Procedure: AMPUTATION RAY;  Surgeon: Wylene Simmer, MD;  Location: [place];  Service: Orthopedics;  Laterality: Left;  Left Hallux Amputation  . Av fistula placement Left 08/12/2014    Procedure: CREATION OF A BRACHIOCEPHALIC ARTERIOVENOUS (AV) FISTULA  LEFT ARM;  Surgeon: Mal Misty, MD;  Location: [place];  Service: Vascular;  Laterality: Left;  . Av fistula placement Right 10/07/2014    Procedure: Creation Right Arm Arteriovenous fistula;   Surgeon: Mal Misty, MD;  Location: [place];  Service: Vascular;  Laterality: Right;  . Appendectomy     Family History  Family History  Problem Relation Age of Onset  . Adopted: Yes  . Varicose Veins Mother   . Varicose Veins Sister    Social History  reports that he has never smoked. He has never used smokeless tobacco. He reports that he drinks alcohol. He reports that he uses illicit drugs (Cocaine and Marijuana). Allergies No Known Allergies Home medications Prior to Admission medications   Medication Sig Start Date End Date Taking? Authorizing Provider  aspirin 81 MG chewable tablet Chew 324 mg by mouth once.   Yes Historical Provider, MD  nitroGLYCERIN (NITROSTAT) 0.4 MG SL tablet Place 0.4 mg under the tongue every 5 (five) minutes as needed for chest pain.   Yes Historical Provider, MD  atorvastatin (LIPITOR) 80 MG tablet Take 1 tablet (80 mg total) by mouth daily at 6 PM. Patient not taking: Reported on 03/14/2015 02/03/15   Robbie Lis, MD  calcitRIOL (ROCALTROL) 0.25 MCG capsule Take 1 capsule (0.25 mcg total) by mouth daily. Patient not taking: Reported on 03/14/2015 11/05/14   [name], MD  calcium acetate (PHOSLO) 667 MG capsule Take 2 capsules (1,334  mg total) by mouth 3 (three) times daily with meals. Patient not taking: Reported on 03/14/2015 12/15/14   Renella Cunas, MD  carvedilol (COREG) 25 MG tablet Take 1 tablet (25 mg total) by mouth 2 (two) times daily with a meal. Patient not taking: Reported on 03/14/2015 09/15/14   Renella Cunas, MD  famotidine (PEPCID) 20 MG tablet Take 40 mg by mouth 2 (two) times daily.    Historical Provider, MD  FLUoxetine (PROZAC) 20 MG capsule Take 20 mg by mouth daily.    Historical Provider, MD  furosemide (LASIX) 80 MG tablet Take 1 tablet (80 mg total) by mouth 2 (two) times daily. Per renal Patient not taking: Reported on 03/14/2015 02/11/15   Hildred Priest, MD  hydrALAZINE (APRESOLINE) 100 MG tablet Take 1 tablet (100  mg total) by mouth 3 (three) times daily. Patient not taking: Reported on 03/14/2015 12/07/14   [name], MD  insulin aspart (NOVOLOG) 100 UNIT/ML injection Before each meal 3 times a day, 140-199 - 2 units, 200-250 - 4 units, 251-299 - 6 units,  300-349 - 8 units,  350 or above 10 units. Insulin PEN if approved, provide syringes and needles if needed. Can switch to any short-acting insulin. 12/01/14   Thurnell Lose, MD  losartan (COZAAR) 25 MG tablet Take 1 tablet (25 mg total) by mouth daily. Patient not taking: Reported on 03/14/2015 02/11/15   Hildred Priest, MD  mirtazapine (REMERON) 30 MG tablet Take 30 mg by mouth at bedtime.    Historical Provider, MD   Liver Function Tests No results for input(s): AST, ALT, ALKPHOS, BILITOT, PROT, ALBUMIN in the last 168 hours. No results for input(s): LIPASE, AMYLASE in the last 168 hours. CBC  Recent Labs Lab 03/14/15 2107  WBC 11.1*  HGB 8.7*  HCT 26.3*  MCV 92.6  PLT 371   Basic Metabolic Panel  Recent Labs Lab 03/14/15 2107 03/15/15 0505  NA 133* 137  K 4.6 4.2  CL 111 114*  CO2 13* 12*  GLUCOSE 239* 121*  BUN 49* 47*  CREATININE 5.07* 4.76*  CALCIUM 8.5* 8.8*    Filed Vitals:   03/15/15 0100 03/15/15 0132 03/15/15 0330 03/15/15 0409  BP: 194/116 189/113 189/95 187/101  Pulse: 109 108 123   Temp:  97.7 F (36.5 C)    TempSrc:  Oral    Resp:  22    Height:  5' 11"  (1.803 m)    Weight:  85.821 kg (189 lb 3.2 oz)    SpO2: 96% 99% 97%    Exam Fatigued, pale, pleasant, not in distress No rash, cyanosis or gangrene Sclera anicteric, throat clear ++JVD Chest bibasilar soft rales RRR soft SEM no RG ABd soft ntnd no mass or ascites +bs GU normal male 2+ bilat LE edema throughout S/p great toe amp x 1 Neuro no asterixis, Ox 3, nf   HD info - pending  Assessment: 1. Uremia/ missed HD x 4 weeks - recent start to HD early June this year. Needs vol removed and solute, plan HD today and tomorrow. When  feeling better and vol down can be dc'd back to OP HD. He says he has worked out the issues at home and will be able to make to OP HD now.  2. DM 2 3. HTN on losartan/ coreg/ hydral at home 4. Anemia get records 5. MBD get records 6. Hypoxemia/ pulm edema - HD today   Plan- HD as above  Kelly Splinter MD (pgr) [phone]    (  c) [phone] 03/15/2015, 10:20 AM

## 2015-03-16 DIAGNOSIS — D472 Monoclonal gammopathy: Secondary | ICD-10-CM

## 2015-03-16 DIAGNOSIS — E1165 Type 2 diabetes mellitus with hyperglycemia: Secondary | ICD-10-CM

## 2015-03-16 DIAGNOSIS — R0602 Shortness of breath: Secondary | ICD-10-CM

## 2015-03-16 DIAGNOSIS — E1129 Type 2 diabetes mellitus with other diabetic kidney complication: Secondary | ICD-10-CM

## 2015-03-16 DIAGNOSIS — I471 Supraventricular tachycardia: Secondary | ICD-10-CM

## 2015-03-16 DIAGNOSIS — N186 End stage renal disease: Principal | ICD-10-CM

## 2015-03-16 LAB — RENAL FUNCTION PANEL
ANION GAP: 8 (ref 5–15)
Albumin: 2.4 g/dL — ABNORMAL LOW (ref 3.5–5.0)
BUN: 35 mg/dL — ABNORMAL HIGH (ref 6–20)
CO2: 24 mmol/L (ref 22–32)
CREATININE: 4.66 mg/dL — AB (ref 0.61–1.24)
Calcium: 8.7 mg/dL — ABNORMAL LOW (ref 8.9–10.3)
Chloride: 106 mmol/L (ref 101–111)
GFR calc Af Amer: 15 mL/min — ABNORMAL LOW (ref >60)
GFR, EST NON AFRICAN AMERICAN: 13 mL/min — AB (ref >60)
GLUCOSE: 229 mg/dL — AB (ref 65–99)
PHOSPHORUS: 3.8 mg/dL (ref 2.5–4.6)
POTASSIUM: 3.8 mmol/L (ref 3.5–5.1)
Sodium: 138 mmol/L (ref 135–145)

## 2015-03-16 LAB — CBC
HCT: 28.3 % — ABNORMAL LOW (ref 39.0–52.0)
HEMOGLOBIN: 9.3 g/dL — AB (ref 13.0–17.0)
MCH: 30.3 pg (ref 26.0–34.0)
MCHC: 32.9 g/dL (ref 30.0–36.0)
MCV: 92.2 fL (ref 78.0–100.0)
Platelets: 312 10*3/uL (ref 150–400)
RBC: 3.07 MIL/uL — ABNORMAL LOW (ref 4.22–5.81)
RDW: 17 % — ABNORMAL HIGH (ref 11.5–15.5)
WBC: 12 10*3/uL — AB (ref 4.0–10.5)

## 2015-03-16 LAB — TROPONIN I
TROPONIN I: 0.09 ng/mL — AB (ref <0.031)
Troponin I: 0.11 ng/mL — ABNORMAL HIGH (ref <0.031)
Troponin I: 0.09 ng/mL — ABNORMAL HIGH (ref <0.031)

## 2015-03-16 LAB — GLUCOSE, CAPILLARY
Glucose-Capillary: 131 mg/dL — ABNORMAL HIGH (ref 65–99)
Glucose-Capillary: 120 mg/dL — ABNORMAL HIGH (ref 65–99)
Glucose-Capillary: 182 mg/dL — ABNORMAL HIGH (ref 65–99)

## 2015-03-16 MED ORDER — CYCLOBENZAPRINE HCL 10 MG PO TABS
10.0000 mg | ORAL_TABLET | Freq: Once | ORAL | Status: AC
  Administered 2015-03-16: 10 mg via ORAL
  Filled 2015-03-16: qty 1

## 2015-03-16 MED ORDER — KETOROLAC TROMETHAMINE 30 MG/ML IJ SOLN: 30.0000 mg | Freq: Once | INTRAMUSCULAR | Status: DC

## 2015-03-16 MED ORDER — ONDANSETRON HCL 4 MG/2ML IJ SOLN
4.0000 mg | Freq: Four times a day (QID) | INTRAMUSCULAR | Status: DC | PRN
  Administered 2015-03-16: 4 mg via INTRAVENOUS
  Filled 2015-03-16: qty 2

## 2015-03-16 MED ORDER — HYDROCODONE-ACETAMINOPHEN 5-325 MG PO TABS
1.0000 | ORAL_TABLET | Freq: Once | ORAL | Status: AC
  Administered 2015-03-16: 1 via ORAL
  Filled 2015-03-16: qty 1

## 2015-03-16 MED ORDER — DILTIAZEM HCL 30 MG PO TABS
30.0000 mg | ORAL_TABLET | Freq: Four times a day (QID) | ORAL | Status: DC
  Administered 2015-03-16 – 2015-03-17 (×4): 30 mg via ORAL
  Filled 2015-03-16 (×4): qty 1

## 2015-03-16 MED ORDER — HYDROCODONE-ACETAMINOPHEN 5-325 MG PO TABS: 1.0000 | ORAL_TABLET | ORAL | Status: DC | PRN

## 2015-03-16 NOTE — Progress Notes (Signed)
Patient c/o's of nausea. NP Schorr paged. [facility] MooseRN

## 2015-03-16 NOTE — Progress Notes (Signed)
Pt HR sustained in 160's for approximately 1-2 minutes. Pt c/o of dizziness and said "I can feel my heart racing." BP 171/87 HR 122. MD notified. EKG performed. New orders received. Will continue to monitor.   Ruben Reason, RN

## 2015-03-16 NOTE — Progress Notes (Signed)
[facility]                                                                              Patient Demographics  [name], is a 54 y.o. male, DOB - 11-12-1960, IOX:[id number]  Admit date - 03/14/2015   Admitting Physician Etta Quill, DO  Outpatient Primary MD for the patient is Minerva Ends, MD  LOS - 1   Chief Complaint  Patient presents with  . Chest Pain       Brief HPI  HPI: [name] is a 54 y.o. male with h/o ESRD, started dialysis last month during admission at the beginning of month (see notes from that admit for more details). After being discharged, patient basically stopped all medications and has not been to dialysis for a month now. During this time he has become slowly but progressively more short of breath, had worsening peripheral edema, developed orthopnea. SOB is worse with exertion, he now has chest tightness / pain with exertion. Patient was admitted for further workup    Assessment & Plan    Principal Problem:   ESRD (end stage renal disease)with volume overload/pulmonary edema - nephrology consultant, hemodialysis yesterday and planned today - Discussed in detail with the patient, he feels that the current outpatient dialysis center is further away and he is having difficulty with transportation and requesting to have a closer outpatient dialysis center. D/w Hemodialysis unit, [facility].  Active Problems: NSVT: symptomatic, with palpitations, dizziness and lightheadedness - EKG sinus tachycardia, rate 120, troponin elevated at 0.11 however somewhat nonspecific due to pulmonary edema from ESRD - Ordered limited echo, requested cardiology consultation    DM (diabetes mellitus), type 2, uncontrolled, with renal complications - noncompliant with all of his medications, Continue sliding scale insulin    Acute diastolic CHF (congestive heart failure):  - likely due to #1, missing dialysis and his medications for  a month - volume management with hemodialysis  Cocaine abuse - Placed social worker consult  social issues - patient reports that he is under significant stress as his girlfriend left him and abandoned her children and he is taking care of them, 71-year-old daughter is his and 68 year old daughter is girlfriend's from previous relationship.currently neighbor is taking care of them.  Code Status: full code  Family Communication: Discussed in detail with the patient, all imaging results, lab results explained to the patient    Disposition Plan: unclear   Time Spent in minutes  25 minutes  Procedures  HD  Consults   Renal  Cardiology   DVT Prophylaxis  heparin   Medications  Scheduled Meds: . atorvastatin  80 mg Oral q1800  . calcitRIOL  0.5 mcg Oral Q T,Th,Sa-HD  . calcium acetate  1,334 mg Oral TID WC  . darbepoetin (ARANESP) injection - DIALYSIS  100 mcg Intravenous Q Tue-HD  . famotidine  20 mg Oral BID  . ferric gluconate (FERRLECIT/NULECIT) IV  62.5 mg Intravenous Q Tue-HD  . FLUoxetine  20 mg Oral Daily  . furosemide  80 mg Oral BID  . heparin  5,000 Units Subcutaneous 3 times per day  . hydrALAZINE  100  mg Oral 3 times per day  . insulin aspart  0-9 Units Subcutaneous TID WC  . losartan  25 mg Oral Daily  . mirtazapine  30 mg Oral QHS  . nitroGLYCERIN  1 inch Topical 4 times per day   Continuous Infusions:  PRN Meds:.nitroGLYCERIN, ondansetron   Antibiotics   Anti-infectives    Start     Dose/Rate Route Frequency Ordered Stop   03/14/15 2330  cefTAZidime (FORTAZ) 2 g in dextrose 5 % 50 mL IVPB     2 g 100 mL/hr over 30 Minutes Intravenous  Once 03/14/15 2315 03/15/15 0025   03/14/15 2330  vancomycin (VANCOCIN) 1,500 mg in sodium chloride 0.9 % 500 mL IVPB     1,500 mg 250 mL/hr over 120 Minutes Intravenous  Once 03/14/15 2318 03/15/15 0230   03/14/15 2315  cefTAZidime (FORTAZ) 2 g in dextrose 5 % 50 mL IVPB  Status:  Discontinued     2 g 100 mL/hr over  30 Minutes Intravenous Every 24 hours 03/14/15 2311 03/14/15 2315        Subjective:   [name] was seen and examined today.episode of NSVT this morning with symptoms of dizziness and lightheadedness, denies any chest pain or shortness of breath.  deniedchest pain, shortness of breath, abdominal pain, N/V/D/C, new weakness, numbess, tingling. No acute events overnight.    Objective:   Blood pressure 171/87, pulse 115, temperature 98.9 F (37.2 C), temperature source Oral, resp. rate 18, height 5\' 11"  (1.803 m), weight 81.647 kg (180 lb), SpO2 96 %.  Wt Readings from Last 3 Encounters:  03/16/15 81.647 kg (180 lb)  02/10/15 80.2 kg (176 lb 12.9 oz)  02/08/15 82.6 kg (182 lb 1.6 oz)     Intake/Output Summary (Last 24 hours) at 03/16/15 1202 Last data filed at 03/16/15 1128  Gross per 24 hour  Intake    480 ml  Output   2150 ml  Net  -1670 ml    Exam  General: Alert and oriented x 3, NAD  HEENT:  PERRLA, EOMI, Anicteric Sclera, mucous membranes moist.   Neck: Supple, no JVD, no masses  CVS: S1 S2 auscultated, no rubs, murmurs or gallops. Regular rate and rhythm.  Respiratory: Clear to auscultation bilaterally, no wheezing, rales or rhonchi  Abdomen: Soft, nontender, nondistended, + bowel sounds  Ext: no cyanosis clubbing or edema  Neuro: AAOx3, Cr N's II- XII. Strength 5/5 upper and lower extremities bilaterally  Skin: No rashes  Psych: Normal affect and demeanor, alert and oriented x3    Data Review   Micro Results No results found for this or any previous visit (from the past 240 hour(s)).  Radiology Reports Dg Chest 2 View  03/14/2015   CLINICAL DATA:  Left-sided chest pain, cough, shortness of breath, sweating, nausea, dizziness, and diarrhea for 1 week. History of hypertension, diabetes, and CHF.  EXAM: CHEST  2 VIEW  COMPARISON:  01/26/2015  FINDINGS: Borderline heart size with normal pulmonary vascularity. Minimal blunting of the right  costophrenic angle suggesting a small pleural effusion. Infiltration or atelectasis in the right lung base suggesting pneumonia or atelectasis. Left lung appears clear. Mediastinal contours are intact. No pneumothorax. Degenerative changes in the spine and shoulders.  IMPRESSION: Small right pleural effusion with infiltration or atelectasis in the right lung base.   Electronically Signed   By: Lucienne Capers M.D.   On: 03/14/2015 21:26    CBC  Recent Labs Lab 03/14/15 2107  WBC 11.1*  HGB 8.7*  HCT 26.3*  PLT 331  MCV 92.6  MCH 30.6  MCHC 33.1  RDW 16.6*    Chemistries   Recent Labs Lab 03/14/15 2107 03/15/15 0505  NA 133* 137  K 4.6 4.2  CL 111 114*  CO2 13* 12*  GLUCOSE 239* 121*  BUN 49* 47*  CREATININE 5.07* 4.76*  CALCIUM 8.5* 8.8*   ------------------------------------------------------------------------------------------------------------------ estimated creatinine clearance is 19.1 mL/min (by C-G formula based on Cr of 4.76). ------------------------------------------------------------------------------------------------------------------ No results for input(s): HGBA1C in the last 72 hours. ------------------------------------------------------------------------------------------------------------------ No results for input(s): CHOL, HDL, LDLCALC, TRIG, CHOLHDL, LDLDIRECT in the last 72 hours. ------------------------------------------------------------------------------------------------------------------ No results for input(s): TSH, T4TOTAL, T3FREE, THYROIDAB in the last 72 hours.  Invalid input(s): FREET3 ------------------------------------------------------------------------------------------------------------------ No results for input(s): VITAMINB12, FOLATE, FERRITIN, TIBC, IRON, RETICCTPCT in the last 72 hours.  Coagulation profile No results for input(s): INR, PROTIME in the last 168 hours.  No results for input(s): DDIMER in the last 72  hours.  Cardiac Enzymes  Recent Labs Lab 03/15/15 0505 03/16/15 0951  TROPONINI 0.09* 0.11*   ------------------------------------------------------------------------------------------------------------------ Invalid input(s): POCBNP   Recent Labs  03/15/15 0722 03/15/15 1133 03/15/15 1851 03/15/15 2033 03/16/15 0745 03/16/15 1127  GLUCAP 109* 145* 94 196* 131* 120*     RAI,RIPUDEEP M.D. [facility] 03/16/2015, 12:02 PM  Pager: [phone] Between 7am to 7pm - call Pager - [phone]  After 7pm go to [email or web address] - password [email or web address]  Call night coverage person covering after 7pm

## 2015-03-16 NOTE — Consult Note (Signed)
CARDIOLOGY CONSULT NOTE   Patient ID: [name] MRN: [id number], DOB/AGE: [birth year]   Admit date: 03/14/2015 Date of Consult: 03/16/2015   Primary Physician: Minerva Ends, MD Primary Cardiologist: Dr. Verl Blalock  Pt. Profile  54 year old gentleman admitted with chest pain and worsening dyspnea.  History of end-stage renal disease on dialysis.  History of cocaine abuse.  Problem List  Past Medical History  Diagnosis Date  . PVD (peripheral vascular disease)   . Shortness of breath dyspnea   . Constipation   . CHF (congestive heart failure) 2015  . Diastolic congestive heart failure   . Diabetes mellitus 2015    type 2  . GERD (gastroesophageal reflux disease)     unsure  . Hypertension 2015  . Chronic renal disease, stage IV 2015  . Cocaine abuse   . Cellulitis and abscess of foot 08/24/2011  . Protein calorie malnutrition   . Headache   . Multiple myeloma   . Anemia     Past Surgical History  Procedure Laterality Date  . Leg surgery    . Knee surgery Right     "metal plate and screws" per patient   . Amputation  09/06/2011    Procedure: AMPUTATION RAY;  Surgeon: Wylene Simmer, MD;  Location: [facility];  Service: Orthopedics;  Laterality: Left;  Left Hallux Amputation  . Av fistula placement Left 08/12/2014    Procedure: CREATION OF A BRACHIOCEPHALIC ARTERIOVENOUS (AV) FISTULA  LEFT ARM;  Surgeon: Mal Misty, MD;  Location: [facility];  Service: Vascular;  Laterality: Left;  . Av fistula placement Right 10/07/2014    Procedure: Creation Right Arm Arteriovenous fistula;  Surgeon: Mal Misty, MD;  Location: [facility] OR;  Service: Vascular;  Laterality: Right;  . Appendectomy       Allergies  No Known Allergies  HPI   This 54 year old gentleman was admitted on 03/14/15 because of the shortness of breath and chest discomfort.  He has a past history of hypertension and a past history of cocaine abuse.  On his last admission his urine drug screen was positive for  cocaine and again this time was positive for cocaine.  The patient has a history of end-stage renal disease.  On his last admission he was dialyzed.  Once he went home, because of social issues he stopped all of his medicines and did not go to dialysis for about a month.  On his last admission his echocardiogram on 01/27/15 showed a normal ejection fraction of 55-60% and there was moderate mitral regurgitation.  His electrocardiogram on admission showed no acute ischemic changes.  He was in normal sinus rhythm.  He has a mild bump in his troponins peaking at 0.11.  Since admission he has had runs of paroxysmal supraventricular tachycardia at rates up to 160/min.  He is aware of his heart pounding hard when his heart beats fast.  Inpatient Medications  . atorvastatin  80 mg Oral q1800  . calcitRIOL  0.5 mcg Oral Q T,Th,Sa-HD  . calcium acetate  1,334 mg Oral TID WC  . darbepoetin (ARANESP) injection - DIALYSIS  100 mcg Intravenous Q Tue-HD  . famotidine  20 mg Oral BID  . ferric gluconate (FERRLECIT/NULECIT) IV  62.5 mg Intravenous Q Tue-HD  . FLUoxetine  20 mg Oral Daily  . furosemide  80 mg Oral BID  . heparin  5,000 Units Subcutaneous 3 times per day  . hydrALAZINE  100 mg Oral 3 times per day  . insulin aspart  0-9 Units Subcutaneous TID WC  . losartan  25 mg Oral Daily  . mirtazapine  30 mg Oral QHS  . nitroGLYCERIN  1 inch Topical 4 times per day    Family History Family History  Problem Relation Age of Onset  . Adopted: Yes  . Varicose Veins Mother   . Varicose Veins Sister      Social History History   Social History  . Marital Status: Legally Separated    Spouse Name: N/A  . Number of Children: N/A  . Years of Education: N/A   Occupational History  . Not on file.   Social History Main Topics  . Smoking status: Never Smoker   . Smokeless tobacco: Never Used  . Alcohol Use: 0.0 oz/week    0 Standard drinks or equivalent per week     Comment: once in a while per  patient "beer/brandy"  . Drug Use: Yes    Special: Cocaine, Marijuana     Comment: per pt* smoked marijuana as a teenager and last cocaine use was two months ago  . Sexual Activity: No   Other Topics Concern  . Not on file   Social History Narrative     Review of Systems  General:  No chills, fever, night sweats or weight changes.  Cardiovascular:  No chest pain, dyspnea on exertion, edema, orthopnea, palpitations, paroxysmal nocturnal dyspnea. Dermatological: No rash, lesions/masses Respiratory: No cough, dyspnea Urologic: No hematuria, dysuria Abdominal:   No nausea, vomiting, diarrhea, bright red blood per rectum, melena, or hematemesis Neurologic:  No visual changes, wkns, changes in mental status. All other systems reviewed and are otherwise negative except as noted above.  Physical Exam  Blood pressure 171/87, pulse 115, temperature 98.9 F (37.2 C), temperature source Oral, resp. rate 18, height 5' 11"  (1.803 m), weight 180 lb (81.647 kg), SpO2 96 %.  General: Pleasant, NAD Psych: Normal affect. Neuro: Alert and oriented X 3. Moves all extremities spontaneously. HEENT: Normal  Neck: Supple without bruits or JVD.  There is a continuous bruit over the lateral right clavicle radiating from his AV fistula in right arm Lungs:  Resp regular and unlabored, CTA. Heart: RRR .  There is a loud S3 gallop.  There is a grade 2/6 holosystolic murmur of mitral regurgitation loudest at the apex.  No pericardial rub. Abdomen: Soft, non-tender, non-distended, BS + x 4.  Extremities: No clubbing, cyanosis or edema. DP/PT/Radials 2+ and equal bilaterally.  Labs   Recent Labs  03/15/15 0505 03/16/15 0951  TROPONINI 0.09* 0.11*   Lab Results  Component Value Date   WBC 11.1* 03/14/2015   HGB 8.7* 03/14/2015   HCT 26.3* 03/14/2015   MCV 92.6 03/14/2015   PLT 331 03/14/2015     Recent Labs Lab 03/15/15 0505  NA 137  K 4.2  CL 114*  CO2 12*  BUN 47*  CREATININE 4.76*    CALCIUM 8.8*  GLUCOSE 121*   Lab Results  Component Value Date   CHOL 139 08/04/2014   HDL 56 08/04/2014   LDLCALC 67 08/04/2014   TRIG 82 08/04/2014   No results found for: DDIMER  Radiology/Studies  Dg Chest 2 View  03/14/2015   CLINICAL DATA:  Left-sided chest pain, cough, shortness of breath, sweating, nausea, dizziness, and diarrhea for 1 week. History of hypertension, diabetes, and CHF.  EXAM: CHEST  2 VIEW  COMPARISON:  01/26/2015  FINDINGS: Borderline heart size with normal pulmonary vascularity. Minimal blunting of the right costophrenic angle suggesting  a small pleural effusion. Infiltration or atelectasis in the right lung base suggesting pneumonia or atelectasis. Left lung appears clear. Mediastinal contours are intact. No pneumothorax. Degenerative changes in the spine and shoulders.  IMPRESSION: Small right pleural effusion with infiltration or atelectasis in the right lung base.   Electronically Signed   By: Lucienne Capers M.D.   On: 03/14/2015 21:26    ECG  Sinus tachycardia.  No ischemic changes.  Personally reviewed.  ASSESSMENT AND PLAN  1.  Paroxysmal supraventricular tachycardia probably secondary to recent cocaine ingestion. 2.  Mild troponin leak consistent with tachycardia and cocaine ingestion 3.  End-stage renal disease 4.  Medication noncompliance 5.  Hypertension 6.  Prominent S3 gallop rhythms suggests left ventricular systolic dysfunction.  Repeat echo is pending.  Plan: Because of his cocaine usage, we will avoid beta blockers.  We will add calcium channel blocker in the form of diltiazem 30 mg every 6 hours for control of SVT and help with blood pressure.   Berna Spare MD  03/16/2015, 1:39 PM

## 2015-03-16 NOTE — Progress Notes (Signed)
03/16/2015 2:34 PM Hemodialysis Outpatient Note; This patient wishes to transfer to a center closer to his place of living. We are looking at the [facility] location and await their acceptance. Thank you. Gordy Savers

## 2015-03-16 NOTE — Progress Notes (Signed)
  [facility] Progress Note   Subjective: Good UF yest iw dh  Filed Vitals:   03/15/15 2030 03/16/15 0515 03/16/15 0621 03/16/15 0751  BP: 175/74 148/76  171/87  Pulse: 119 115    Temp: 99 F (37.2 C) 98.9 F (37.2 C)    TempSrc: Oral Oral    Resp: 18     Height:      Weight:  81.647 kg (180 lb)    SpO2: 94% 88% 97% 96%   Exam: Fatigued, pale, pleasant, no distress Chest clear bilat RRR soft SEM no RG ABd ntnd no mass or ascites +bs 1+ LE edema bilat S/p great toe amp x 1 Neuro no asterixis, Ox 3, nf   TTS [facility]KC  4h  81kg  2/2 bath   Heparin 2400   LUA AVF ARanesp 150/wk Venofer 50/wk Calcitriol 0.5 ug TIW  Assessment: 1. Uremia/ missed HD x 4 weeks in OP setting. Had HD last night. HD again today 2. Hypoxemia/ pulm edema - improved clinically 3. DM 2 4. HTN on losartan/ coreg/ hydral at home 5. Anemia get records 6. MBD get records 7. Drug abuse +cocaine  Plan - HD today and tomorrow am. Plan dc after HD tomorrow. Have asked SW to see for [facility] transportation to dialysis. May be changing HD units, we are looking into this today.     Kelly Splinter MD  pager [phone]    cell [phone]  03/16/2015, 12:26 PM     Recent Labs Lab 03/14/15 2107 03/15/15 0505  NA 133* 137  K 4.6 4.2  CL 111 114*  CO2 13* 12*  GLUCOSE 239* 121*  BUN 49* 47*  CREATININE 5.07* 4.76*  CALCIUM 8.5* 8.8*   No results for input(s): AST, ALT, ALKPHOS, BILITOT, PROT, ALBUMIN in the last 168 hours.  Recent Labs Lab 03/14/15 2107  WBC 11.1*  HGB 8.7*  HCT 26.3*  MCV 92.6  PLT 331   . atorvastatin  80 mg Oral q1800  . calcitRIOL  0.5 mcg Oral Q T,Th,Sa-HD  . calcium acetate  1,334 mg Oral TID WC  . darbepoetin (ARANESP) injection - DIALYSIS  100 mcg Intravenous Q Tue-HD  . famotidine  20 mg Oral BID  . ferric gluconate (FERRLECIT/NULECIT) IV  62.5 mg Intravenous Q Tue-HD  . FLUoxetine  20 mg Oral Daily  . furosemide  80 mg Oral BID  . heparin  5,000 Units  Subcutaneous 3 times per day  . hydrALAZINE  100 mg Oral 3 times per day  . insulin aspart  0-9 Units Subcutaneous TID WC  . losartan  25 mg Oral Daily  . mirtazapine  30 mg Oral QHS  . nitroGLYCERIN  1 inch Topical 4 times per day     nitroGLYCERIN, ondansetron

## 2015-03-17 ENCOUNTER — Ambulatory Visit (HOSPITAL_COMMUNITY): Payer: Medicaid Other

## 2015-03-17 DIAGNOSIS — R0600 Dyspnea, unspecified: Secondary | ICD-10-CM

## 2015-03-17 DIAGNOSIS — R079 Chest pain, unspecified: Secondary | ICD-10-CM

## 2015-03-17 DIAGNOSIS — I5031 Acute diastolic (congestive) heart failure: Secondary | ICD-10-CM

## 2015-03-17 DIAGNOSIS — I471 Supraventricular tachycardia: Secondary | ICD-10-CM | POA: Insufficient documentation

## 2015-03-17 LAB — CBC
HEMATOCRIT: 28 % — AB (ref 39.0–52.0)
Hemoglobin: 9.2 g/dL — ABNORMAL LOW (ref 13.0–17.0)
MCH: 30.2 pg (ref 26.0–34.0)
MCHC: 32.9 g/dL (ref 30.0–36.0)
MCV: 91.8 fL (ref 78.0–100.0)
Platelets: 295 10*3/uL (ref 150–400)
RBC: 3.05 MIL/uL — ABNORMAL LOW (ref 4.22–5.81)
RDW: 16.9 % — ABNORMAL HIGH (ref 11.5–15.5)
WBC: 14.6 10*3/uL — ABNORMAL HIGH (ref 4.0–10.5)

## 2015-03-17 LAB — RENAL FUNCTION PANEL
Albumin: 2.4 g/dL — ABNORMAL LOW (ref 3.5–5.0)
Anion gap: 10 (ref 5–15)
BUN: 16 mg/dL (ref 6–20)
CO2: 27 mmol/L (ref 22–32)
Calcium: 8.5 mg/dL — ABNORMAL LOW (ref 8.9–10.3)
Chloride: 98 mmol/L — ABNORMAL LOW (ref 101–111)
Creatinine, Ser: 3.48 mg/dL — ABNORMAL HIGH (ref 0.61–1.24)
GFR calc Af Amer: 22 mL/min — ABNORMAL LOW (ref >60)
GFR calc non Af Amer: 19 mL/min — ABNORMAL LOW (ref >60)
Glucose, Bld: 113 mg/dL — ABNORMAL HIGH (ref 65–99)
Phosphorus: 3.6 mg/dL (ref 2.5–4.6)
Potassium: 3.7 mmol/L (ref 3.5–5.1)
Sodium: 135 mmol/L (ref 135–145)

## 2015-03-17 LAB — GLUCOSE, CAPILLARY
GLUCOSE-CAPILLARY: 164 mg/dL — AB (ref 65–99)
Glucose-Capillary: 205 mg/dL — ABNORMAL HIGH (ref 65–99)

## 2015-03-17 MED ORDER — LIDOCAINE-PRILOCAINE 2.5-2.5 % EX CREA
1.0000 "application " | TOPICAL_CREAM | CUTANEOUS | Status: DC | PRN
  Filled 2015-03-17: qty 5

## 2015-03-17 MED ORDER — LIDOCAINE HCL (PF) 1 % IJ SOLN: 5.0000 mL | INTRAMUSCULAR | Status: DC | PRN

## 2015-03-17 MED ORDER — DILTIAZEM HCL ER COATED BEADS 180 MG PO CP24
180.0000 mg | ORAL_CAPSULE | Freq: Every day | ORAL | Status: DC
  Administered 2015-03-17: 180 mg via ORAL
  Filled 2015-03-17: qty 1

## 2015-03-17 MED ORDER — DILTIAZEM HCL ER COATED BEADS 180 MG PO CP24: 180.0000 mg | ORAL_CAPSULE | Freq: Every day | ORAL | Status: DC

## 2015-03-17 MED ORDER — SODIUM CHLORIDE 0.9 % IV SOLN: 100.0000 mL | INTRAVENOUS | Status: DC | PRN

## 2015-03-17 MED ORDER — BISACODYL 5 MG PO TBEC
10.0000 mg | DELAYED_RELEASE_TABLET | Freq: Once | ORAL | Status: AC
  Administered 2015-03-17: 10 mg via ORAL
  Filled 2015-03-17: qty 2

## 2015-03-17 MED ORDER — HEPARIN SODIUM (PORCINE) 1000 UNIT/ML DIALYSIS
4200.0000 [IU] | Freq: Once | INTRAMUSCULAR | Status: DC
  Filled 2015-03-17: qty 5

## 2015-03-17 MED ORDER — DARBEPOETIN ALFA 100 MCG/0.5ML IJ SOSY
PREFILLED_SYRINGE | INTRAMUSCULAR | Status: AC
  Filled 2015-03-17: qty 0.5

## 2015-03-17 MED ORDER — LEVOFLOXACIN 500 MG PO TABS: 500.0000 mg | ORAL_TABLET | ORAL | Status: DC

## 2015-03-17 MED ORDER — PENTAFLUOROPROP-TETRAFLUOROETH EX AERO
INHALATION_SPRAY | CUTANEOUS | Status: AC
  Administered 2015-03-17: 1 via TOPICAL
  Filled 2015-03-17: qty 103.5

## 2015-03-17 MED ORDER — ALTEPLASE 2 MG IJ SOLR
2.0000 mg | Freq: Once | INTRAMUSCULAR | Status: DC | PRN
  Filled 2015-03-17: qty 2

## 2015-03-17 MED ORDER — HEPARIN SODIUM (PORCINE) 1000 UNIT/ML DIALYSIS
1000.0000 [IU] | INTRAMUSCULAR | Status: DC | PRN
  Filled 2015-03-17: qty 1

## 2015-03-17 MED ORDER — PENTAFLUOROPROP-TETRAFLUOROETH EX AERO
1.0000 "application " | INHALATION_SPRAY | CUTANEOUS | Status: DC | PRN
  Administered 2015-03-17: 1 via TOPICAL

## 2015-03-17 MED ORDER — NEPRO/CARBSTEADY PO LIQD
237.0000 mL | ORAL | Status: DC | PRN
  Filled 2015-03-17: qty 237

## 2015-03-17 MED ORDER — LEVOFLOXACIN 750 MG PO TABS
750.0000 mg | ORAL_TABLET | Freq: Once | ORAL | Status: AC
  Administered 2015-03-17: 750 mg via ORAL
  Filled 2015-03-17: qty 1

## 2015-03-17 NOTE — Discharge Summary (Signed)
Physician Discharge Summary   Patient ID: [name] MRN: [id number] DOB/AGE: [birth year] 54 y.o.  Admit date: 03/14/2015 Discharge date: 03/17/2015  Primary Care Physician:  Minerva Ends, MD  Discharge Diagnoses:    . Shortness of breath/pulmonary edema  . ESRD (end stage renal disease) . substance abuse (cocaine) . DM (diabetes mellitus), type 2, uncontrolled, with renal complications . IgA monoclonal gammopathy . Acute diastolic CHF (congestive heart failure)  Consults:  Cardiology Nephrology, Dr. Melvia Heaps   Recommendations for Outpatient Follow-up:  Patient has been extremely noncompliant with with medications, dialysis, patient had stopped taking all his medications and dialysis for last 1 month.   Coreg was stopped due to his cocaine abuse    TESTS THAT NEED FOLLOW-UP BMET, CBC   DIET: *Renal diet    Allergies:  No Known Allergies   Discharge Medications:   Medication List    STOP taking these medications        carvedilol 25 MG tablet  Commonly known as:  COREG     furosemide 80 MG tablet  Commonly known as:  LASIX     insulin aspart 100 UNIT/ML injection  Commonly known as:  NOVOLOG      TAKE these medications        aspirin 81 MG chewable tablet  Chew 324 mg by mouth once.     atorvastatin 80 MG tablet  Commonly known as:  LIPITOR  Take 1 tablet (80 mg total) by mouth daily at 6 PM.     calcitRIOL 0.25 MCG capsule  Commonly known as:  ROCALTROL  Take 1 capsule (0.25 mcg total) by mouth daily.     calcium acetate 667 MG capsule  Commonly known as:  PHOSLO  Take 2 capsules (1,334 mg total) by mouth 3 (three) times daily with meals.     diltiazem 180 MG 24 hr capsule  Commonly known as:  CARDIZEM CD  Take 1 capsule (180 mg total) by mouth daily.     famotidine 20 MG tablet  Commonly known as:  PEPCID  Take 40 mg by mouth 2 (two) times daily.     FLUoxetine 20 MG capsule  Commonly known as:  PROZAC  Take 20 mg by  mouth daily.     hydrALAZINE 100 MG tablet  Commonly known as:  APRESOLINE  Take 1 tablet (100 mg total) by mouth 3 (three) times daily.     levofloxacin 500 MG tablet  Commonly known as:  LEVAQUIN  Take 1 tablet (500 mg total) by mouth every other day. x7 doses  Start taking on:  03/19/2015     losartan 25 MG tablet  Commonly known as:  COZAAR  Take 1 tablet (25 mg total) by mouth daily.     mirtazapine 30 MG tablet  Commonly known as:  REMERON  Take 30 mg by mouth at bedtime.     nitroGLYCERIN 0.4 MG SL tablet  Commonly known as:  NITROSTAT  Place 0.4 mg under the tongue every 5 (five) minutes as needed for chest pain.         Brief H and P: For complete details please refer to admission H and P, but in brief, [name] is a 54 y.o. male with h/o ESRD, started dialysis last month during admission at the beginning of month (see notes from that admit for more details). After being discharged, patient basically stopped all medications and has not been to dialysis for a month now. During this time he has become  slowly but progressively more short of breath, had worsening peripheral edema, developed orthopnea. SOB is worse with exertion, he now has chest tightness / pain with exertion. Patient was admitted for further workup.  Hospital Course:   ESRD (end stage renal disease)with volume overload/pulmonary edema: Due to severe noncompliance, and has stopped taking his medications and stopped his hemodialysis for one month prior to admission Nephrology was consulted and patient received hemodialysis back-to-back for 3 days. Patient also reported social stressors including his girlfriend leaving him and abandoning the children which caused his behavior and cocaine use. He will resume hemodialysis at the [facility] Tuesday Thursday and Saturday schedule.  Patient also reported difficulty with transportation and was requested to have closer outpatient  dialysis center. I discussed in detail with hemodialysis unit and nephrology. At this point patient has been adequately dialyzed, Dr. Jonnie Finner recommended him to continue his hemodialysis at the [facility] dialysis center and can change it outpatient.  Nephrology, Dr. Jonnie Finner recommended no Lasix as volume management for CHF will be by hemodialysis.  NSVT on 7/13: symptomatic, with palpitations, dizziness and lightheadedness - EKG sinus tachycardia, rate 120, troponin elevated at 0.11 however somewhat nonspecific due to pulmonary edema from ESRD. Cardiology consult was obtained and recommended starting him on Cardizem which did improve his heart rate. Patient cannot be on beta blocker due to his cocaine abuse.    DM (diabetes mellitus), type 2, uncontrolled, with renal complications - noncompliant with all of his medications, patient was continued on sliding scale insulin while inpatient.   Acute diastolic CHF (congestive heart failure):  - likely due to #1, missing dialysis and his medications for a month - volume management with hemodialysis  Cocaine abuse Patient was strongly counseled to quit using cocaine. He is motivated to quit.  social issues - patient reports that he is under significant stress as his girlfriend left him and abandoned her children and he is taking care of them, 40-year-old daughter is his and 69 year old daughter is girlfriend's from previous relationship.currently neighbor is taking care of them.  Leukocytosis: Possibility of mild right lung pneumonia, although patient denies having productive cough or fevers Chest x-ray did show possibility of infiltrate or atelectasis in the right lower lung, hence placed on Levaquin for 7 days with renal dosing.  Day of Discharge BP 138/74 mmHg  Pulse 104  Temp(Src) 98 F (36.7 C) (Oral)  Resp 17  Ht 5\' 11"  (1.803 m)  Wt 78.9 kg (173 lb 15.1 oz)  BMI 24.27 kg/m2  SpO2 96%  Physical Exam: General: Alert and awake  oriented x3 not in any acute distress. HEENT: anicteric sclera, pupils reactive to light and accommodation CVS: S1-S2 clear no murmur rubs or gallops Chest: clear to auscultation bilaterally, no wheezing rales or rhonchi Abdomen: soft nontender, nondistended, normal bowel sounds Extremities: no cyanosis, clubbing or edema noted bilaterally Neuro: Cranial nerves II-XII intact, no focal neurological deficits   The results of significant diagnostics from this hospitalization (including imaging, microbiology, ancillary and laboratory) are listed below for reference.    LAB RESULTS: Basic Metabolic Panel:  Recent Labs Lab 03/16/15 1406 03/17/15 0635  NA 138 135  K 3.8 3.7  CL 106 98*  CO2 24 27  GLUCOSE 229* 113*  BUN 35* 16  CREATININE 4.66* 3.48*  CALCIUM 8.7* 8.5*  PHOS 3.8 3.6   Liver Function Tests:  Recent Labs Lab 03/16/15 1406 03/17/15 0635  ALBUMIN 2.4* 2.4*   No results for input(s): LIPASE, AMYLASE  in the last 168 hours. No results for input(s): AMMONIA in the last 168 hours. CBC:  Recent Labs Lab 03/16/15 1406 03/17/15 0839  WBC 12.0* 14.6*  HGB 9.3* 9.2*  HCT 28.3* 28.0*  MCV 92.2 91.8  PLT 312 295   Cardiac Enzymes:  Recent Labs Lab 03/16/15 1508 03/16/15 2039  TROPONINI 0.09* 0.09*   BNP: Invalid input(s): POCBNP CBG:  Recent Labs Lab 03/16/15 2022 03/17/15 1201  GLUCAP 182* 164*    Significant Diagnostic Studies:  Dg Chest 2 View  03/14/2015   CLINICAL DATA:  Left-sided chest pain, cough, shortness of breath, sweating, nausea, dizziness, and diarrhea for 1 week. History of hypertension, diabetes, and CHF.  EXAM: CHEST  2 VIEW  COMPARISON:  01/26/2015  FINDINGS: Borderline heart size with normal pulmonary vascularity. Minimal blunting of the right costophrenic angle suggesting a small pleural effusion. Infiltration or atelectasis in the right lung base suggesting pneumonia or atelectasis. Left lung appears clear. Mediastinal contours  are intact. No pneumothorax. Degenerative changes in the spine and shoulders.  IMPRESSION: Small right pleural effusion with infiltration or atelectasis in the right lung base.   Electronically Signed   By: Lucienne Capers M.D.   On: 03/14/2015 21:26    2D ECHO:   Disposition and Follow-up: Discharge Instructions    Diet - low sodium heart healthy    Complete by:  As directed      Discharge instructions    Complete by:  As directed   Please continue hemodialysis at the [facility] dialysis center on Saturday. Please take your medications as instructed.     Increase activity slowly    Complete by:  As directed             DISPOSITION: Home  DISCHARGE FOLLOW-UP     Follow-up Information    Follow up with Jenell Milliner, MD.   Specialty:  Cardiology   Why:  Please call Dr. Verl Blalock and followup with you as soon as possible.   Contact information:   [address] [place] [address] [phone]       Follow up with [facility]    . Schedule an appointment as soon as possible for a visit in 10 days.   Why:  for hospital follow-up   Contact information:   [place] [address] [phone]       Time spent on Discharge: 35 mins   Signed:   Rakeya Glab M.D. [facility] 03/17/2015, 2:15 PM Pager: [phone]

## 2015-03-17 NOTE — Procedures (Signed)
Doing much better.  Likely for dc today. Will resume HD at [facility] and if he wants to transfer later to [place] which is a bit closer to where he lives, he can take it up in the OP setting. Transportation to be addressed by SW prior to dc today.    I was present at this dialysis session, have reviewed the session itself and made  appropriate changes Kelly Splinter MD (pgr) [phone]    [phone] 02/19/2015, 10:31 AM

## 2015-03-17 NOTE — Progress Notes (Signed)
Patient Name: [name] Date of Encounter: 03/17/2015  Primary Cardiologist: Dr. Verl Blalock   Principal Problem:   ESRD (end stage renal disease) Active Problems:   DM (diabetes mellitus), type 2, uncontrolled, with renal complications   IgA monoclonal gammopathy   Acute diastolic CHF (congestive heart failure)   Stimulant use disorder (cocaine)   Shortness of breath    SUBJECTIVE  Patient seen during HD. Denies any CP or SOB. No recent cough. States nurse told him he had low grade fever this morning (not seen in chart, Tmax 99.2). Has been having problem urinating (?BPH). Also has not had a BM in 3 days   CURRENT MEDS . atorvastatin  80 mg Oral q1800  . calcitRIOL  0.5 mcg Oral Q T,Th,Sa-HD  . calcium acetate  1,334 mg Oral TID WC  . darbepoetin (ARANESP) injection - DIALYSIS  100 mcg Intravenous Q Tue-HD  . diltiazem  30 mg Oral 4 times per day  . famotidine  20 mg Oral BID  . ferric gluconate (FERRLECIT/NULECIT) IV  62.5 mg Intravenous Q Tue-HD  . FLUoxetine  20 mg Oral Daily  . furosemide  80 mg Oral BID  . [START ON 03/18/2015] heparin  4,200 Units Dialysis Once in dialysis  . heparin  5,000 Units Subcutaneous 3 times per day  . hydrALAZINE  100 mg Oral 3 times per day  . insulin aspart  0-9 Units Subcutaneous TID WC  . losartan  25 mg Oral Daily  . mirtazapine  30 mg Oral QHS  . nitroGLYCERIN  1 inch Topical 4 times per day    OBJECTIVE  Filed Vitals:   03/17/15 0740 03/17/15 0745 03/17/15 0800 03/17/15 0830  BP: 139/70 134/72 107/47 111/65  Pulse: 112 109 109 106  Temp: 98.4 F (36.9 C)     TempSrc: Oral     Resp: 15 18 19 14   Height:      Weight: 176 lb 2.4 oz (79.9 kg)     SpO2: 94%       Intake/Output Summary (Last 24 hours) at 03/17/15 0934 Last data filed at 03/17/15 0100  Gross per 24 hour  Intake    240 ml  Output   3455 ml  Net  -3215 ml   Filed Weights   03/16/15 1857 03/17/15 0425 03/17/15 0740  Weight: 174 lb 9.7 oz (79.2 kg) 175  lb 3.2 oz (79.47 kg) 176 lb 2.4 oz (79.9 kg)    PHYSICAL EXAM  General: Pleasant, NAD.  Neuro: Alert and oriented X 3. Moves all extremities spontaneously. Psych: Normal affect. HEENT:  Normal  Neck: Supple without bruits or JVD. Lungs:  Resp regular and unlabored, CTA. Heart: Tachycardic. no s3, s4, or murmurs. Abdomen: Soft, non-tender, non-distended, BS + x 4.  Extremities: No clubbing, cyanosis or edema.   Accessory Clinical Findings  CBC  Recent Labs  03/16/15 1406 03/17/15 0839  WBC 12.0* 14.6*  HGB 9.3* 9.2*  HCT 28.3* 28.0*  MCV 92.2 91.8  PLT 312 315   Basic Metabolic Panel  Recent Labs  03/16/15 1406 03/17/15 0635  NA 138 135  K 3.8 3.7  CL 106 98*  CO2 24 27  GLUCOSE 229* 113*  BUN 35* 16  CREATININE 4.66* 3.48*  CALCIUM 8.7* 8.5*  PHOS 3.8 3.6   Liver Function Tests  Recent Labs  03/16/15 1406 03/17/15 0635  ALBUMIN 2.4* 2.4*   Cardiac Enzymes  Recent Labs  03/16/15 0951 03/16/15 1508 03/16/15 2039  TROPONINI 0.11* 0.09*  0.09*    TELE Sinus tach with HR low 100s    ECG  No new EKG  Echocardiogram 01/27/2015  LV EF: 55% -  60%  ------------------------------------------------------------------- Indications:   CHF - 428.0.  ------------------------------------------------------------------- History:  PMH:  Congestive heart failure. Risk factors: Cocaine Abuse. Hypertension. Diabetes mellitus.  ------------------------------------------------------------------- Study Conclusions  - Left ventricle: The cavity size was normal. Wall thickness was increased in a pattern of mild LVH. Systolic function was normal. The estimated ejection fraction was in the range of 55% to 60%. Wall motion was normal; there were no regional wall motion abnormalities. Left ventricular diastolic function parameters were normal. - Mitral valve: There was moderate regurgitation. - Left atrium: The atrium was mildly dilated. -  Right ventricle: The cavity size was mildly dilated. - Right atrium: The atrium was mildly dilated. - Tricuspid valve: There was moderate regurgitation. - Pulmonary arteries: PA peak pressure: 56 mm Hg (S).  Impressions:  - The right ventricular systolic pressure was increased consistent with moderate pulmonary hypertension.     Radiology/Studies  Dg Chest 2 View  03/14/2015   CLINICAL DATA:  Left-sided chest pain, cough, shortness of breath, sweating, nausea, dizziness, and diarrhea for 1 week. History of hypertension, diabetes, and CHF.  EXAM: CHEST  2 VIEW  COMPARISON:  01/26/2015  FINDINGS: Borderline heart size with normal pulmonary vascularity. Minimal blunting of the right costophrenic angle suggesting a small pleural effusion. Infiltration or atelectasis in the right lung base suggesting pneumonia or atelectasis. Left lung appears clear. Mediastinal contours are intact. No pneumothorax. Degenerative changes in the spine and shoulders.  IMPRESSION: Small right pleural effusion with infiltration or atelectasis in the right lung base.   Electronically Signed   By: Lucienne Capers M.D.   On: 03/14/2015 21:26    ASSESSMENT AND PLAN  54 year old gentleman admitted with chest pain and worsening dyspnea. History of end-stage renal disease on dialysis. History of cocaine abuse.  1. Paroxysmal supraventricular tachycardia probably secondary to recent cocaine ingestion.  - better after starting short acting diltaizem, unclear cause for persistent baseline sinus tachycardia, last cocaine use per pt was a wk ago, however UDT was positive for cocaine on 7/11  - No BB with recent cocaine use. previously on coreg 25mg  BID  - normally with 40mg  q6HR short acting diltiazem, would consolidate to 120mg  long acting diltiazem CD, however pt continue to have occasional PACs and PVCs, will instead consolidate to 180mg  long acting diltiazem later today after HD if BP stable. May need to cut back on  hydralazine 100mg  TID if needed.    2. Mild troponin leak consistent with tachycardia and cocaine ingestion  - serial flat, no ACS pattern  3. End-stage renal disease  4. Medication noncompliance  5. Hypertension  6. Prominent S3 gallop rhythms suggests left ventricular systolic dysfunction. Repeat echo is pending.  7. Chronic normocytic anemia  8. Rising leukocytosis: per IM  - unclear cause, no fever overnight, although states to have trouble urinate, it sound more like BPH instead of UTI, UA on 7/11 was negative for nitrite. Lung clear on auscultation.  Hilbert Corrigan PA-C Pager: [phone] Patient seen and examined  Agree with findings of H Meng above  No further SVT Agree with diltiazem 180   Pt has f/u with T Wall in [facility] [place] to d/c.  Dorris Carnes

## 2015-03-17 NOTE — Progress Notes (Signed)
Pt completed HD with no complications. Pt removed 1.5 L fluid. Goal not met d/t cramping and nausea. Report given and CCMD notified. Pt returned safely to room.

## 2015-03-17 NOTE — Progress Notes (Signed)
  Echocardiogram 2D Echocardiogram has been performed.  [name] 03/17/2015, 3:32 PM

## 2015-03-17 NOTE — Progress Notes (Signed)
CSW (Clinical Education officer, museum) provided transportation resources to pt. Pt to call to determine Medicaid transport eligibility. SCAT application completed. Pt has no further hospital social work needs. CSW signing off.  [place], [place]

## 2015-03-28 ENCOUNTER — Encounter: Payer: Self-pay | Admitting: Family Medicine

## 2015-03-28 ENCOUNTER — Ambulatory Visit: Payer: Medicaid Other | Attending: Family Medicine | Admitting: Family Medicine

## 2015-03-28 VITALS — BP 147/82 | HR 91 | Temp 98.6°F | Resp 18 | Ht 71.0 in | Wt 180.0 lb

## 2015-03-28 DIAGNOSIS — I151 Hypertension secondary to other renal disorders: Secondary | ICD-10-CM | POA: Diagnosis not present

## 2015-03-28 DIAGNOSIS — E1129 Type 2 diabetes mellitus with other diabetic kidney complication: Secondary | ICD-10-CM

## 2015-03-28 DIAGNOSIS — N184 Chronic kidney disease, stage 4 (severe): Secondary | ICD-10-CM | POA: Diagnosis not present

## 2015-03-28 DIAGNOSIS — N2889 Other specified disorders of kidney and ureter: Secondary | ICD-10-CM

## 2015-03-28 DIAGNOSIS — E1165 Type 2 diabetes mellitus with hyperglycemia: Secondary | ICD-10-CM | POA: Diagnosis not present

## 2015-03-28 DIAGNOSIS — F321 Major depressive disorder, single episode, moderate: Secondary | ICD-10-CM

## 2015-03-28 DIAGNOSIS — IMO0002 Reserved for concepts with insufficient information to code with codable children: Secondary | ICD-10-CM

## 2015-03-28 LAB — GLUCOSE, POCT (MANUAL RESULT ENTRY): POC Glucose: 127 mg/dl — AB (ref 70–99)

## 2015-03-28 MED ORDER — FLUOXETINE HCL 20 MG PO CAPS: 20.0000 mg | ORAL_CAPSULE | Freq: Every day | ORAL | Status: DC

## 2015-03-28 NOTE — Progress Notes (Signed)
Hospital F/U F/U DM

## 2015-03-28 NOTE — Patient Instructions (Signed)
[name],  Thank you for coming in today  1. Diabetes: Well controlled  2. HTN: BP a bit elevated today Continue all med Continue dialysis I will not refill losartan until I have a chance to discuss with your nephrologist  3. Depression: Improved. Refilled prozac   F/u in 4-6 weeks with RN for flu shot F/u with me in 3 months for HTN and diabetes  Dr. Adrian Blackwater

## 2015-03-28 NOTE — Assessment & Plan Note (Signed)
Diabetes: Well controlled Continue current diet

## 2015-03-28 NOTE — Progress Notes (Signed)
   Subjective:    Patient ID: [name], male    DOB: [birth year], 54 y.o.   MRN: [id number] CC; f/u DM2 and depression  HPI 54 yo M with CKD, HD dependent presents for f/u  1. DM2: diet controlled. No changes in weight. No fatigue or polydipsia.   2. Depression: improved. On prozac. No SI. Spent 3 days in behavioral health for SI after last OV.  Has been cocaine positive on many occassions including the past 3 UDS. Caring for his 2 daughters. Less stress at home since her partner no longer lives with him.   3. HTN: compliant with meds except of HD days. Has some low HD at home. Requesting refill of losartan.   Soc Hx: chronic non smoker  Review of Systems  Constitutional: Negative for fever, chills, fatigue and unexpected weight change.  Respiratory: Negative for cough and shortness of breath.   Cardiovascular: Negative for chest pain, palpitations and leg swelling.  Skin: Negative for rash.  Psychiatric/Behavioral: Negative for self-injury.  GAD-7: score of 7. 1s to all     Objective:   Physical Exam BP 147/82 mmHg  Pulse 91  Temp(Src) 98.6 F (37 C) (Oral)  Resp 18  Ht 5\' 11"  (1.803 m)  Wt 180 lb (81.647 kg)  BMI 25.12 kg/m2  SpO2 98% General appearance: alert, cooperative and no distress Lungs: clear to auscultation bilaterally Heart: regular rate and rhythm, S1, S2 normal, no murmur, click, rub or gallop Extremities: extremities normal, atraumatic, no cyanosis or edema  Lab Results  Component Value Date   HGBA1C 6.0 02/09/2015   CBG 127   Depression screen [facility] 2/9 03/28/2015 02/08/2015 12/15/2014 12/07/2014 11/05/2014  Decreased Interest 1 2 1  0 0  Down, Depressed, Hopeless 2 2 1  0 0  PHQ - 2 Score 3 4 2  0 0  Altered sleeping 1 2 - - -  Tired, decreased energy 1 2 - - -  Change in appetite 2 2 - - -  Feeling bad or failure about yourself  2 2 - - -  Trouble concentrating 1 2 - - -  Moving slowly or fidgety/restless 1 2 - - -  Suicidal thoughts 1 2 - - -    PHQ-9 Score 12 18 - - -        Assessment & Plan:

## 2015-03-28 NOTE — Assessment & Plan Note (Signed)
Depression: Improved. Refilled prozac

## 2015-03-28 NOTE — Assessment & Plan Note (Signed)
HTN: BP a bit elevated today Continue all med Continue dialysis I will not refill losartan until I have a chance to discuss with your nephrologist

## 2015-04-05 DIAGNOSIS — E1129 Type 2 diabetes mellitus with other diabetic kidney complication: Secondary | ICD-10-CM | POA: Diagnosis not present

## 2015-04-05 DIAGNOSIS — D509 Iron deficiency anemia, unspecified: Secondary | ICD-10-CM | POA: Diagnosis not present

## 2015-04-05 DIAGNOSIS — N2581 Secondary hyperparathyroidism of renal origin: Secondary | ICD-10-CM | POA: Diagnosis not present

## 2015-04-05 DIAGNOSIS — N186 End stage renal disease: Secondary | ICD-10-CM | POA: Diagnosis not present

## 2015-04-05 DIAGNOSIS — D631 Anemia in chronic kidney disease: Secondary | ICD-10-CM | POA: Diagnosis not present

## 2015-04-07 DIAGNOSIS — D509 Iron deficiency anemia, unspecified: Secondary | ICD-10-CM | POA: Diagnosis not present

## 2015-04-07 DIAGNOSIS — D631 Anemia in chronic kidney disease: Secondary | ICD-10-CM | POA: Diagnosis not present

## 2015-04-07 DIAGNOSIS — E1129 Type 2 diabetes mellitus with other diabetic kidney complication: Secondary | ICD-10-CM | POA: Diagnosis not present

## 2015-04-07 DIAGNOSIS — N186 End stage renal disease: Secondary | ICD-10-CM | POA: Diagnosis not present

## 2015-04-07 DIAGNOSIS — N2581 Secondary hyperparathyroidism of renal origin: Secondary | ICD-10-CM | POA: Diagnosis not present

## 2015-04-09 DIAGNOSIS — E1129 Type 2 diabetes mellitus with other diabetic kidney complication: Secondary | ICD-10-CM | POA: Diagnosis not present

## 2015-04-09 DIAGNOSIS — D509 Iron deficiency anemia, unspecified: Secondary | ICD-10-CM | POA: Diagnosis not present

## 2015-04-09 DIAGNOSIS — N186 End stage renal disease: Secondary | ICD-10-CM | POA: Diagnosis not present

## 2015-04-09 DIAGNOSIS — D631 Anemia in chronic kidney disease: Secondary | ICD-10-CM | POA: Diagnosis not present

## 2015-04-09 DIAGNOSIS — N2581 Secondary hyperparathyroidism of renal origin: Secondary | ICD-10-CM | POA: Diagnosis not present

## 2015-04-12 DIAGNOSIS — D509 Iron deficiency anemia, unspecified: Secondary | ICD-10-CM | POA: Diagnosis not present

## 2015-04-12 DIAGNOSIS — N186 End stage renal disease: Secondary | ICD-10-CM | POA: Diagnosis not present

## 2015-04-12 DIAGNOSIS — E1129 Type 2 diabetes mellitus with other diabetic kidney complication: Secondary | ICD-10-CM | POA: Diagnosis not present

## 2015-04-12 DIAGNOSIS — N2581 Secondary hyperparathyroidism of renal origin: Secondary | ICD-10-CM | POA: Diagnosis not present

## 2015-04-12 DIAGNOSIS — D631 Anemia in chronic kidney disease: Secondary | ICD-10-CM | POA: Diagnosis not present

## 2015-04-13 ENCOUNTER — Ambulatory Visit: Payer: Medicare Other | Attending: Cardiology | Admitting: Cardiology

## 2015-04-13 ENCOUNTER — Encounter: Payer: Self-pay | Admitting: Cardiology

## 2015-04-13 VITALS — BP 157/80 | HR 80 | Temp 97.6°F | Resp 18 | Ht 71.0 in | Wt 180.0 lb

## 2015-04-13 DIAGNOSIS — I5032 Chronic diastolic (congestive) heart failure: Secondary | ICD-10-CM | POA: Insufficient documentation

## 2015-04-13 DIAGNOSIS — I509 Heart failure, unspecified: Secondary | ICD-10-CM | POA: Diagnosis present

## 2015-04-13 NOTE — Patient Instructions (Signed)
Thank you for coming in to see Dr. Verl Blalock today.  Please come back in 6 months.

## 2015-04-13 NOTE — Progress Notes (Signed)
Patient returns today for evaluation and management of his congestive heart failure. He is having no shortness of breath, orthopnea, PND, or edema. He is now on routine dialysis 3 times a week. This is really helped his volume control as well as his edema. He has no other complaints today.  His exam shows him be in no acute distress. Blood pressures mildly elevated. He did take his meds this morning. Neck shows no JVD, lungs are clear to auscultation, cardiac exam normal H3-S4 with systolic murmur no gallop. Extremities show no edema

## 2015-04-13 NOTE — Progress Notes (Signed)
Patient here for 3 month follow up. Patient denies any pain today.   Patient received hepatitis shot yesterday and reports it is itching a lot. Patient received it because he is traveling soon.   Patient is on dialysis 3x/week.   Patient is not taking aspririn for a while. Patient reports he has taken his morning medications today and does not need refills on any medications.   Patient denies any SOB, wheezing, chest pain. Patient reports he has been having lots of fluid in his feet, but with dialysis it has decreased.   Patient reports he has been having vision issues. Patient is a diabetic.

## 2015-04-13 NOTE — Assessment & Plan Note (Signed)
Stable. Continue current medical program. Return to me in 6 months.

## 2015-04-14 DIAGNOSIS — D509 Iron deficiency anemia, unspecified: Secondary | ICD-10-CM | POA: Diagnosis not present

## 2015-04-14 DIAGNOSIS — E1129 Type 2 diabetes mellitus with other diabetic kidney complication: Secondary | ICD-10-CM | POA: Diagnosis not present

## 2015-04-14 DIAGNOSIS — N186 End stage renal disease: Secondary | ICD-10-CM | POA: Diagnosis not present

## 2015-04-14 DIAGNOSIS — D631 Anemia in chronic kidney disease: Secondary | ICD-10-CM | POA: Diagnosis not present

## 2015-04-14 DIAGNOSIS — N2581 Secondary hyperparathyroidism of renal origin: Secondary | ICD-10-CM | POA: Diagnosis not present

## 2015-04-16 DIAGNOSIS — N2581 Secondary hyperparathyroidism of renal origin: Secondary | ICD-10-CM | POA: Diagnosis not present

## 2015-04-16 DIAGNOSIS — D509 Iron deficiency anemia, unspecified: Secondary | ICD-10-CM | POA: Diagnosis not present

## 2015-04-16 DIAGNOSIS — N186 End stage renal disease: Secondary | ICD-10-CM | POA: Diagnosis not present

## 2015-04-16 DIAGNOSIS — E1129 Type 2 diabetes mellitus with other diabetic kidney complication: Secondary | ICD-10-CM | POA: Diagnosis not present

## 2015-04-16 DIAGNOSIS — D631 Anemia in chronic kidney disease: Secondary | ICD-10-CM | POA: Diagnosis not present

## 2015-04-19 ENCOUNTER — Encounter: Payer: Self-pay | Admitting: Pharmacist

## 2015-04-19 DIAGNOSIS — D509 Iron deficiency anemia, unspecified: Secondary | ICD-10-CM | POA: Diagnosis not present

## 2015-04-19 DIAGNOSIS — D631 Anemia in chronic kidney disease: Secondary | ICD-10-CM | POA: Diagnosis not present

## 2015-04-19 DIAGNOSIS — E1129 Type 2 diabetes mellitus with other diabetic kidney complication: Secondary | ICD-10-CM | POA: Diagnosis not present

## 2015-04-19 DIAGNOSIS — N186 End stage renal disease: Secondary | ICD-10-CM | POA: Diagnosis not present

## 2015-04-19 DIAGNOSIS — N2581 Secondary hyperparathyroidism of renal origin: Secondary | ICD-10-CM | POA: Diagnosis not present

## 2015-04-21 DIAGNOSIS — N186 End stage renal disease: Secondary | ICD-10-CM | POA: Diagnosis not present

## 2015-04-21 DIAGNOSIS — E1129 Type 2 diabetes mellitus with other diabetic kidney complication: Secondary | ICD-10-CM | POA: Diagnosis not present

## 2015-04-21 DIAGNOSIS — N2581 Secondary hyperparathyroidism of renal origin: Secondary | ICD-10-CM | POA: Diagnosis not present

## 2015-04-21 DIAGNOSIS — D631 Anemia in chronic kidney disease: Secondary | ICD-10-CM | POA: Diagnosis not present

## 2015-04-21 DIAGNOSIS — D509 Iron deficiency anemia, unspecified: Secondary | ICD-10-CM | POA: Diagnosis not present

## 2015-04-23 DIAGNOSIS — D509 Iron deficiency anemia, unspecified: Secondary | ICD-10-CM | POA: Diagnosis not present

## 2015-04-23 DIAGNOSIS — E1129 Type 2 diabetes mellitus with other diabetic kidney complication: Secondary | ICD-10-CM | POA: Diagnosis not present

## 2015-04-23 DIAGNOSIS — D631 Anemia in chronic kidney disease: Secondary | ICD-10-CM | POA: Diagnosis not present

## 2015-04-23 DIAGNOSIS — N186 End stage renal disease: Secondary | ICD-10-CM | POA: Diagnosis not present

## 2015-04-23 DIAGNOSIS — N2581 Secondary hyperparathyroidism of renal origin: Secondary | ICD-10-CM | POA: Diagnosis not present

## 2015-04-25 ENCOUNTER — Ambulatory Visit: Payer: Medicare Other | Attending: Family Medicine | Admitting: *Deleted

## 2015-04-25 DIAGNOSIS — Z23 Encounter for immunization: Secondary | ICD-10-CM | POA: Diagnosis not present

## 2015-04-25 NOTE — Progress Notes (Signed)
Patient presents for Flu vaccine States feeling well

## 2015-04-26 DIAGNOSIS — D509 Iron deficiency anemia, unspecified: Secondary | ICD-10-CM | POA: Diagnosis not present

## 2015-04-26 DIAGNOSIS — N186 End stage renal disease: Secondary | ICD-10-CM | POA: Diagnosis not present

## 2015-04-26 DIAGNOSIS — E1129 Type 2 diabetes mellitus with other diabetic kidney complication: Secondary | ICD-10-CM | POA: Diagnosis not present

## 2015-04-26 DIAGNOSIS — N2581 Secondary hyperparathyroidism of renal origin: Secondary | ICD-10-CM | POA: Diagnosis not present

## 2015-04-26 DIAGNOSIS — D631 Anemia in chronic kidney disease: Secondary | ICD-10-CM | POA: Diagnosis not present

## 2015-04-28 DIAGNOSIS — D631 Anemia in chronic kidney disease: Secondary | ICD-10-CM | POA: Diagnosis not present

## 2015-04-28 DIAGNOSIS — N2581 Secondary hyperparathyroidism of renal origin: Secondary | ICD-10-CM | POA: Diagnosis not present

## 2015-04-28 DIAGNOSIS — E1129 Type 2 diabetes mellitus with other diabetic kidney complication: Secondary | ICD-10-CM | POA: Diagnosis not present

## 2015-04-28 DIAGNOSIS — N186 End stage renal disease: Secondary | ICD-10-CM | POA: Diagnosis not present

## 2015-04-28 DIAGNOSIS — D509 Iron deficiency anemia, unspecified: Secondary | ICD-10-CM | POA: Diagnosis not present

## 2015-04-30 DIAGNOSIS — D631 Anemia in chronic kidney disease: Secondary | ICD-10-CM | POA: Diagnosis not present

## 2015-04-30 DIAGNOSIS — N186 End stage renal disease: Secondary | ICD-10-CM | POA: Diagnosis not present

## 2015-04-30 DIAGNOSIS — D509 Iron deficiency anemia, unspecified: Secondary | ICD-10-CM | POA: Diagnosis not present

## 2015-04-30 DIAGNOSIS — E1129 Type 2 diabetes mellitus with other diabetic kidney complication: Secondary | ICD-10-CM | POA: Diagnosis not present

## 2015-04-30 DIAGNOSIS — N2581 Secondary hyperparathyroidism of renal origin: Secondary | ICD-10-CM | POA: Diagnosis not present

## 2015-05-03 DIAGNOSIS — D509 Iron deficiency anemia, unspecified: Secondary | ICD-10-CM | POA: Diagnosis not present

## 2015-05-03 DIAGNOSIS — N186 End stage renal disease: Secondary | ICD-10-CM | POA: Diagnosis not present

## 2015-05-03 DIAGNOSIS — D631 Anemia in chronic kidney disease: Secondary | ICD-10-CM | POA: Diagnosis not present

## 2015-05-03 DIAGNOSIS — E1129 Type 2 diabetes mellitus with other diabetic kidney complication: Secondary | ICD-10-CM | POA: Diagnosis not present

## 2015-05-03 DIAGNOSIS — N2581 Secondary hyperparathyroidism of renal origin: Secondary | ICD-10-CM | POA: Diagnosis not present

## 2015-05-04 DIAGNOSIS — Z992 Dependence on renal dialysis: Secondary | ICD-10-CM | POA: Diagnosis not present

## 2015-05-04 DIAGNOSIS — N186 End stage renal disease: Secondary | ICD-10-CM | POA: Diagnosis not present

## 2015-05-04 DIAGNOSIS — E1122 Type 2 diabetes mellitus with diabetic chronic kidney disease: Secondary | ICD-10-CM | POA: Diagnosis not present

## 2015-05-05 DIAGNOSIS — E1129 Type 2 diabetes mellitus with other diabetic kidney complication: Secondary | ICD-10-CM | POA: Diagnosis not present

## 2015-05-05 DIAGNOSIS — N186 End stage renal disease: Secondary | ICD-10-CM | POA: Diagnosis not present

## 2015-05-05 DIAGNOSIS — D631 Anemia in chronic kidney disease: Secondary | ICD-10-CM | POA: Diagnosis not present

## 2015-05-05 DIAGNOSIS — Z23 Encounter for immunization: Secondary | ICD-10-CM | POA: Diagnosis not present

## 2015-05-05 DIAGNOSIS — N2581 Secondary hyperparathyroidism of renal origin: Secondary | ICD-10-CM | POA: Diagnosis not present

## 2015-05-05 DIAGNOSIS — D509 Iron deficiency anemia, unspecified: Secondary | ICD-10-CM | POA: Diagnosis not present

## 2015-05-07 DIAGNOSIS — D509 Iron deficiency anemia, unspecified: Secondary | ICD-10-CM | POA: Diagnosis not present

## 2015-05-07 DIAGNOSIS — Z23 Encounter for immunization: Secondary | ICD-10-CM | POA: Diagnosis not present

## 2015-05-07 DIAGNOSIS — E1129 Type 2 diabetes mellitus with other diabetic kidney complication: Secondary | ICD-10-CM | POA: Diagnosis not present

## 2015-05-07 DIAGNOSIS — N2581 Secondary hyperparathyroidism of renal origin: Secondary | ICD-10-CM | POA: Diagnosis not present

## 2015-05-07 DIAGNOSIS — N186 End stage renal disease: Secondary | ICD-10-CM | POA: Diagnosis not present

## 2015-05-07 DIAGNOSIS — D631 Anemia in chronic kidney disease: Secondary | ICD-10-CM | POA: Diagnosis not present

## 2015-05-10 DIAGNOSIS — D631 Anemia in chronic kidney disease: Secondary | ICD-10-CM | POA: Diagnosis not present

## 2015-05-10 DIAGNOSIS — N186 End stage renal disease: Secondary | ICD-10-CM | POA: Diagnosis not present

## 2015-05-10 DIAGNOSIS — N2581 Secondary hyperparathyroidism of renal origin: Secondary | ICD-10-CM | POA: Diagnosis not present

## 2015-05-10 DIAGNOSIS — Z23 Encounter for immunization: Secondary | ICD-10-CM | POA: Diagnosis not present

## 2015-05-10 DIAGNOSIS — D509 Iron deficiency anemia, unspecified: Secondary | ICD-10-CM | POA: Diagnosis not present

## 2015-05-10 DIAGNOSIS — E1129 Type 2 diabetes mellitus with other diabetic kidney complication: Secondary | ICD-10-CM | POA: Diagnosis not present

## 2015-05-12 DIAGNOSIS — N2581 Secondary hyperparathyroidism of renal origin: Secondary | ICD-10-CM | POA: Diagnosis not present

## 2015-05-12 DIAGNOSIS — N186 End stage renal disease: Secondary | ICD-10-CM | POA: Diagnosis not present

## 2015-05-12 DIAGNOSIS — E1129 Type 2 diabetes mellitus with other diabetic kidney complication: Secondary | ICD-10-CM | POA: Diagnosis not present

## 2015-05-12 DIAGNOSIS — D631 Anemia in chronic kidney disease: Secondary | ICD-10-CM | POA: Diagnosis not present

## 2015-05-12 DIAGNOSIS — Z23 Encounter for immunization: Secondary | ICD-10-CM | POA: Diagnosis not present

## 2015-05-12 DIAGNOSIS — D509 Iron deficiency anemia, unspecified: Secondary | ICD-10-CM | POA: Diagnosis not present

## 2015-05-14 DIAGNOSIS — N2581 Secondary hyperparathyroidism of renal origin: Secondary | ICD-10-CM | POA: Diagnosis not present

## 2015-05-14 DIAGNOSIS — E1129 Type 2 diabetes mellitus with other diabetic kidney complication: Secondary | ICD-10-CM | POA: Diagnosis not present

## 2015-05-14 DIAGNOSIS — D631 Anemia in chronic kidney disease: Secondary | ICD-10-CM | POA: Diagnosis not present

## 2015-05-14 DIAGNOSIS — Z23 Encounter for immunization: Secondary | ICD-10-CM | POA: Diagnosis not present

## 2015-05-14 DIAGNOSIS — N186 End stage renal disease: Secondary | ICD-10-CM | POA: Diagnosis not present

## 2015-05-14 DIAGNOSIS — D509 Iron deficiency anemia, unspecified: Secondary | ICD-10-CM | POA: Diagnosis not present

## 2015-05-16 ENCOUNTER — Telehealth: Payer: Self-pay | Admitting: Family Medicine

## 2015-05-16 NOTE — Telephone Encounter (Signed)
Patient called requesting for provider to recommend a foot doctor for him, and also a dentist. Please f/u

## 2015-05-17 DIAGNOSIS — D631 Anemia in chronic kidney disease: Secondary | ICD-10-CM | POA: Diagnosis not present

## 2015-05-17 DIAGNOSIS — N2581 Secondary hyperparathyroidism of renal origin: Secondary | ICD-10-CM | POA: Diagnosis not present

## 2015-05-17 DIAGNOSIS — N186 End stage renal disease: Secondary | ICD-10-CM | POA: Diagnosis not present

## 2015-05-17 DIAGNOSIS — Z23 Encounter for immunization: Secondary | ICD-10-CM | POA: Diagnosis not present

## 2015-05-17 DIAGNOSIS — E1129 Type 2 diabetes mellitus with other diabetic kidney complication: Secondary | ICD-10-CM | POA: Diagnosis not present

## 2015-05-17 DIAGNOSIS — D509 Iron deficiency anemia, unspecified: Secondary | ICD-10-CM | POA: Diagnosis not present

## 2015-05-18 NOTE — Telephone Encounter (Signed)
Patient will need to be seen prior to referrals for assessment

## 2015-05-19 NOTE — Telephone Encounter (Signed)
Pt notified need OV for referral Call transfer to front office for appointment

## 2015-05-21 DIAGNOSIS — D631 Anemia in chronic kidney disease: Secondary | ICD-10-CM | POA: Diagnosis not present

## 2015-05-21 DIAGNOSIS — N2581 Secondary hyperparathyroidism of renal origin: Secondary | ICD-10-CM | POA: Diagnosis not present

## 2015-05-21 DIAGNOSIS — N186 End stage renal disease: Secondary | ICD-10-CM | POA: Diagnosis not present

## 2015-05-21 DIAGNOSIS — D509 Iron deficiency anemia, unspecified: Secondary | ICD-10-CM | POA: Diagnosis not present

## 2015-05-21 DIAGNOSIS — Z23 Encounter for immunization: Secondary | ICD-10-CM | POA: Diagnosis not present

## 2015-05-21 DIAGNOSIS — E1129 Type 2 diabetes mellitus with other diabetic kidney complication: Secondary | ICD-10-CM | POA: Diagnosis not present

## 2015-05-24 DIAGNOSIS — D631 Anemia in chronic kidney disease: Secondary | ICD-10-CM | POA: Diagnosis not present

## 2015-05-24 DIAGNOSIS — E1129 Type 2 diabetes mellitus with other diabetic kidney complication: Secondary | ICD-10-CM | POA: Diagnosis not present

## 2015-05-24 DIAGNOSIS — D509 Iron deficiency anemia, unspecified: Secondary | ICD-10-CM | POA: Diagnosis not present

## 2015-05-24 DIAGNOSIS — Z23 Encounter for immunization: Secondary | ICD-10-CM | POA: Diagnosis not present

## 2015-05-24 DIAGNOSIS — N2581 Secondary hyperparathyroidism of renal origin: Secondary | ICD-10-CM | POA: Diagnosis not present

## 2015-05-24 DIAGNOSIS — N186 End stage renal disease: Secondary | ICD-10-CM | POA: Diagnosis not present

## 2015-05-26 DIAGNOSIS — N2581 Secondary hyperparathyroidism of renal origin: Secondary | ICD-10-CM | POA: Diagnosis not present

## 2015-05-26 DIAGNOSIS — Z23 Encounter for immunization: Secondary | ICD-10-CM | POA: Diagnosis not present

## 2015-05-26 DIAGNOSIS — D509 Iron deficiency anemia, unspecified: Secondary | ICD-10-CM | POA: Diagnosis not present

## 2015-05-26 DIAGNOSIS — N186 End stage renal disease: Secondary | ICD-10-CM | POA: Diagnosis not present

## 2015-05-26 DIAGNOSIS — D631 Anemia in chronic kidney disease: Secondary | ICD-10-CM | POA: Diagnosis not present

## 2015-05-26 DIAGNOSIS — E1129 Type 2 diabetes mellitus with other diabetic kidney complication: Secondary | ICD-10-CM | POA: Diagnosis not present

## 2015-05-28 DIAGNOSIS — Z23 Encounter for immunization: Secondary | ICD-10-CM | POA: Diagnosis not present

## 2015-05-28 DIAGNOSIS — E1129 Type 2 diabetes mellitus with other diabetic kidney complication: Secondary | ICD-10-CM | POA: Diagnosis not present

## 2015-05-28 DIAGNOSIS — N2581 Secondary hyperparathyroidism of renal origin: Secondary | ICD-10-CM | POA: Diagnosis not present

## 2015-05-28 DIAGNOSIS — D631 Anemia in chronic kidney disease: Secondary | ICD-10-CM | POA: Diagnosis not present

## 2015-05-28 DIAGNOSIS — N186 End stage renal disease: Secondary | ICD-10-CM | POA: Diagnosis not present

## 2015-05-28 DIAGNOSIS — D509 Iron deficiency anemia, unspecified: Secondary | ICD-10-CM | POA: Diagnosis not present

## 2015-05-30 ENCOUNTER — Ambulatory Visit: Payer: Self-pay | Admitting: Family Medicine

## 2015-05-31 DIAGNOSIS — N186 End stage renal disease: Secondary | ICD-10-CM | POA: Diagnosis not present

## 2015-05-31 DIAGNOSIS — E1129 Type 2 diabetes mellitus with other diabetic kidney complication: Secondary | ICD-10-CM | POA: Diagnosis not present

## 2015-05-31 DIAGNOSIS — D509 Iron deficiency anemia, unspecified: Secondary | ICD-10-CM | POA: Diagnosis not present

## 2015-05-31 DIAGNOSIS — D631 Anemia in chronic kidney disease: Secondary | ICD-10-CM | POA: Diagnosis not present

## 2015-05-31 DIAGNOSIS — Z23 Encounter for immunization: Secondary | ICD-10-CM | POA: Diagnosis not present

## 2015-05-31 DIAGNOSIS — N2581 Secondary hyperparathyroidism of renal origin: Secondary | ICD-10-CM | POA: Diagnosis not present

## 2015-06-02 DIAGNOSIS — N2581 Secondary hyperparathyroidism of renal origin: Secondary | ICD-10-CM | POA: Diagnosis not present

## 2015-06-02 DIAGNOSIS — E1129 Type 2 diabetes mellitus with other diabetic kidney complication: Secondary | ICD-10-CM | POA: Diagnosis not present

## 2015-06-02 DIAGNOSIS — D631 Anemia in chronic kidney disease: Secondary | ICD-10-CM | POA: Diagnosis not present

## 2015-06-02 DIAGNOSIS — D509 Iron deficiency anemia, unspecified: Secondary | ICD-10-CM | POA: Diagnosis not present

## 2015-06-02 DIAGNOSIS — Z23 Encounter for immunization: Secondary | ICD-10-CM | POA: Diagnosis not present

## 2015-06-02 DIAGNOSIS — N186 End stage renal disease: Secondary | ICD-10-CM | POA: Diagnosis not present

## 2015-06-03 DIAGNOSIS — E1122 Type 2 diabetes mellitus with diabetic chronic kidney disease: Secondary | ICD-10-CM | POA: Diagnosis not present

## 2015-06-03 DIAGNOSIS — N186 End stage renal disease: Secondary | ICD-10-CM | POA: Diagnosis not present

## 2015-06-03 DIAGNOSIS — Z992 Dependence on renal dialysis: Secondary | ICD-10-CM | POA: Diagnosis not present

## 2015-06-04 DIAGNOSIS — Z23 Encounter for immunization: Secondary | ICD-10-CM | POA: Diagnosis not present

## 2015-06-04 DIAGNOSIS — N2581 Secondary hyperparathyroidism of renal origin: Secondary | ICD-10-CM | POA: Diagnosis not present

## 2015-06-04 DIAGNOSIS — N186 End stage renal disease: Secondary | ICD-10-CM | POA: Diagnosis not present

## 2015-06-04 DIAGNOSIS — E1129 Type 2 diabetes mellitus with other diabetic kidney complication: Secondary | ICD-10-CM | POA: Diagnosis not present

## 2015-06-04 DIAGNOSIS — D509 Iron deficiency anemia, unspecified: Secondary | ICD-10-CM | POA: Diagnosis not present

## 2015-06-07 DIAGNOSIS — E1129 Type 2 diabetes mellitus with other diabetic kidney complication: Secondary | ICD-10-CM | POA: Diagnosis not present

## 2015-06-07 DIAGNOSIS — N186 End stage renal disease: Secondary | ICD-10-CM | POA: Diagnosis not present

## 2015-06-07 DIAGNOSIS — D509 Iron deficiency anemia, unspecified: Secondary | ICD-10-CM | POA: Diagnosis not present

## 2015-06-07 DIAGNOSIS — Z23 Encounter for immunization: Secondary | ICD-10-CM | POA: Diagnosis not present

## 2015-06-07 DIAGNOSIS — N2581 Secondary hyperparathyroidism of renal origin: Secondary | ICD-10-CM | POA: Diagnosis not present

## 2015-06-09 DIAGNOSIS — N186 End stage renal disease: Secondary | ICD-10-CM | POA: Diagnosis not present

## 2015-06-09 DIAGNOSIS — D509 Iron deficiency anemia, unspecified: Secondary | ICD-10-CM | POA: Diagnosis not present

## 2015-06-09 DIAGNOSIS — Z23 Encounter for immunization: Secondary | ICD-10-CM | POA: Diagnosis not present

## 2015-06-09 DIAGNOSIS — N2581 Secondary hyperparathyroidism of renal origin: Secondary | ICD-10-CM | POA: Diagnosis not present

## 2015-06-09 DIAGNOSIS — E1129 Type 2 diabetes mellitus with other diabetic kidney complication: Secondary | ICD-10-CM | POA: Diagnosis not present

## 2015-06-13 DIAGNOSIS — H2513 Age-related nuclear cataract, bilateral: Secondary | ICD-10-CM | POA: Diagnosis not present

## 2015-06-13 DIAGNOSIS — E119 Type 2 diabetes mellitus without complications: Secondary | ICD-10-CM | POA: Diagnosis not present

## 2015-06-14 DIAGNOSIS — Z23 Encounter for immunization: Secondary | ICD-10-CM | POA: Diagnosis not present

## 2015-06-14 DIAGNOSIS — N186 End stage renal disease: Secondary | ICD-10-CM | POA: Diagnosis not present

## 2015-06-14 DIAGNOSIS — N2581 Secondary hyperparathyroidism of renal origin: Secondary | ICD-10-CM | POA: Diagnosis not present

## 2015-06-14 DIAGNOSIS — E1129 Type 2 diabetes mellitus with other diabetic kidney complication: Secondary | ICD-10-CM | POA: Diagnosis not present

## 2015-06-14 DIAGNOSIS — D509 Iron deficiency anemia, unspecified: Secondary | ICD-10-CM | POA: Diagnosis not present

## 2015-06-18 DIAGNOSIS — N2581 Secondary hyperparathyroidism of renal origin: Secondary | ICD-10-CM | POA: Diagnosis not present

## 2015-06-18 DIAGNOSIS — D509 Iron deficiency anemia, unspecified: Secondary | ICD-10-CM | POA: Diagnosis not present

## 2015-06-18 DIAGNOSIS — N186 End stage renal disease: Secondary | ICD-10-CM | POA: Diagnosis not present

## 2015-06-18 DIAGNOSIS — E1129 Type 2 diabetes mellitus with other diabetic kidney complication: Secondary | ICD-10-CM | POA: Diagnosis not present

## 2015-06-18 DIAGNOSIS — Z23 Encounter for immunization: Secondary | ICD-10-CM | POA: Diagnosis not present

## 2015-06-20 ENCOUNTER — Telehealth: Payer: Self-pay | Admitting: Family Medicine

## 2015-06-20 NOTE — Telephone Encounter (Signed)
Pt. Called requesting to speak to PCP regarding his health. Please f/u

## 2015-06-21 DIAGNOSIS — Z23 Encounter for immunization: Secondary | ICD-10-CM | POA: Diagnosis not present

## 2015-06-21 DIAGNOSIS — E1129 Type 2 diabetes mellitus with other diabetic kidney complication: Secondary | ICD-10-CM | POA: Diagnosis not present

## 2015-06-21 DIAGNOSIS — N186 End stage renal disease: Secondary | ICD-10-CM | POA: Diagnosis not present

## 2015-06-21 DIAGNOSIS — N2581 Secondary hyperparathyroidism of renal origin: Secondary | ICD-10-CM | POA: Diagnosis not present

## 2015-06-21 DIAGNOSIS — D509 Iron deficiency anemia, unspecified: Secondary | ICD-10-CM | POA: Diagnosis not present

## 2015-06-23 DIAGNOSIS — Z23 Encounter for immunization: Secondary | ICD-10-CM | POA: Diagnosis not present

## 2015-06-23 DIAGNOSIS — D509 Iron deficiency anemia, unspecified: Secondary | ICD-10-CM | POA: Diagnosis not present

## 2015-06-23 DIAGNOSIS — E1129 Type 2 diabetes mellitus with other diabetic kidney complication: Secondary | ICD-10-CM | POA: Diagnosis not present

## 2015-06-23 DIAGNOSIS — N2581 Secondary hyperparathyroidism of renal origin: Secondary | ICD-10-CM | POA: Diagnosis not present

## 2015-06-23 DIAGNOSIS — N186 End stage renal disease: Secondary | ICD-10-CM | POA: Diagnosis not present

## 2015-06-25 DIAGNOSIS — N186 End stage renal disease: Secondary | ICD-10-CM | POA: Diagnosis not present

## 2015-06-25 DIAGNOSIS — Z23 Encounter for immunization: Secondary | ICD-10-CM | POA: Diagnosis not present

## 2015-06-25 DIAGNOSIS — E1129 Type 2 diabetes mellitus with other diabetic kidney complication: Secondary | ICD-10-CM | POA: Diagnosis not present

## 2015-06-25 DIAGNOSIS — D509 Iron deficiency anemia, unspecified: Secondary | ICD-10-CM | POA: Diagnosis not present

## 2015-06-25 DIAGNOSIS — N2581 Secondary hyperparathyroidism of renal origin: Secondary | ICD-10-CM | POA: Diagnosis not present

## 2015-06-27 NOTE — Telephone Encounter (Signed)
Requesting referral dentist  Has cramps on leg  Transfer call to front office for appointmet

## 2015-06-28 DIAGNOSIS — N2581 Secondary hyperparathyroidism of renal origin: Secondary | ICD-10-CM | POA: Diagnosis not present

## 2015-06-28 DIAGNOSIS — Z23 Encounter for immunization: Secondary | ICD-10-CM | POA: Diagnosis not present

## 2015-06-28 DIAGNOSIS — N186 End stage renal disease: Secondary | ICD-10-CM | POA: Diagnosis not present

## 2015-06-28 DIAGNOSIS — E1129 Type 2 diabetes mellitus with other diabetic kidney complication: Secondary | ICD-10-CM | POA: Diagnosis not present

## 2015-06-28 DIAGNOSIS — D509 Iron deficiency anemia, unspecified: Secondary | ICD-10-CM | POA: Diagnosis not present

## 2015-06-29 ENCOUNTER — Encounter: Payer: Self-pay | Admitting: Pharmacist

## 2015-06-29 ENCOUNTER — Ambulatory Visit: Payer: Medicare Other | Attending: Family Medicine | Admitting: Pharmacist

## 2015-06-29 VITALS — BP 155/81

## 2015-06-29 DIAGNOSIS — Z79899 Other long term (current) drug therapy: Secondary | ICD-10-CM | POA: Diagnosis not present

## 2015-06-29 DIAGNOSIS — I10 Essential (primary) hypertension: Secondary | ICD-10-CM | POA: Diagnosis not present

## 2015-06-29 NOTE — Patient Instructions (Signed)
Start taking all of your medications again   Follow up with Dr. Adrian Blackwater next week.  If you have another high blood pressure and a headache, dizziness, nausea/vomiting, or chest pain - please let us know  Stroke Prevention Some medical conditions and behaviors are associated with an increased chance of having a stroke. You may prevent a stroke by making healthy choices and managing medical conditions. HOW CAN I REDUCE MY RISK OF HAVING A STROKE?   Stay physically active. Get at least 30 minutes of activity on most or all days.  Do not smoke. It may also be helpful to avoid exposure to secondhand smoke.  Limit alcohol use. Moderate alcohol use is considered to be:  No more than 2 drinks per day for men.  No more than 1 drink per day for nonpregnant women.  Eat healthy foods. This involves:  Eating 5 or more servings of fruits and vegetables a day.  Making dietary changes that address high blood pressure (hypertension), high cholesterol, diabetes, or obesity.  Manage your cholesterol levels.  Making food choices that are high in fiber and low in saturated fat, trans fat, and cholesterol may control cholesterol levels.  Take any prescribed medicines to control cholesterol as directed by your health care provider.  Manage your diabetes.  Controlling your carbohydrate and sugar intake is recommended to manage diabetes.  Take any prescribed medicines to control diabetes as directed by your health care provider.  Control your hypertension.  Making food choices that are low in salt (sodium), saturated fat, trans fat, and cholesterol is recommended to manage hypertension.  Ask your health care provider if you need treatment to lower your blood pressure. Take any prescribed medicines to control hypertension as directed by your health care provider.  If you are 36-92 years of age, have your blood pressure checked every 3-5 years. If you are 69 years of age or older, have your blood  pressure checked every year.  Maintain a healthy weight.  Reducing calorie intake and making food choices that are low in sodium, saturated fat, trans fat, and cholesterol are recommended to manage weight.  Stop drug abuse.  Avoid taking birth control pills.  Talk to your health care provider about the risks of taking birth control pills if you are over 1 years old, smoke, get migraines, or have ever had a blood clot.  Get evaluated for sleep disorders (sleep apnea).  Talk to your health care provider about getting a sleep evaluation if you snore a lot or have excessive sleepiness.  Take medicines only as directed by your health care provider.  For some people, aspirin or blood thinners (anticoagulants) are helpful in reducing the risk of forming abnormal blood clots that can lead to stroke. If you have the irregular heart rhythm of atrial fibrillation, you should be on a blood thinner unless there is a good reason you cannot take them.  Understand all your medicine instructions.  Make sure that other conditions (such as anemia or atherosclerosis) are addressed. SEEK IMMEDIATE MEDICAL CARE IF:   You have sudden weakness or numbness of the face, arm, or leg, especially on one side of the body.  Your face or eyelid droops to one side.  You have sudden confusion.  You have trouble speaking (aphasia) or understanding.  You have sudden trouble seeing in one or both eyes.  You have sudden trouble walking.  You have dizziness.  You have a loss of balance or coordination.  You have a sudden,  severe headache with no known cause.  You have new chest pain or an irregular heartbeat. Any of these symptoms may represent a serious problem that is an emergency. Do not wait to see if the symptoms will go away. Get medical help at once. Call your local emergency services (911 in [place] Do not drive yourself to the hospital.   This information is not intended to replace advice given to  you by your health care provider. Make sure you discuss any questions you have with your health care provider.   Document Released: 09/27/2004 Document Revised: 09/10/2014 Document Reviewed: 02/20/2013 Elsevier Interactive Patient Education [employer].

## 2015-06-29 NOTE — Progress Notes (Signed)
S:    Patient arrives in good spirits with his daughter 73.    Presents to the clinic for hypertension evaluation.   Patient denies adherence with medications. He reports that he just didn't feel like that were working for him. However, he does take the hydralazine.   Current BP Medications include:  Hydralazine 100 mg TID  Ordered BP Medications that patient is not taking: carvedilol 25 mg BID, diltiazem 180 mg daily, furosemide 80 mg daily, losartan 25 mg daily  Patient reports that he has all of his medications at home. He denies needing any refills.   He reports that he had his blood pressure check out of the office and it was 200/100. He was very worried about it so he came in today to have it checked.   Patient denies warnings signs or symptoms of a stroke. Denies headache, blurry vision, weakness, chest pain, etc.   O:   Last 3 Office BP readings: BP Readings from Last 3 Encounters:  06/29/15 155/81  04/13/15 157/80  03/28/15 147/82    BMET    Component Value Date/Time   NA 135 03/17/2015 0635   K 3.7 03/17/2015 0635   CL 98* 03/17/2015 0635   CO2 27 03/17/2015 0635   GLUCOSE 113* 03/17/2015 0635   BUN 16 03/17/2015 0635   CREATININE 3.48* 03/17/2015 0635   CREATININE 5.40* 02/08/2015 1025   CALCIUM 8.5* 03/17/2015 0635   GFRNONAA 19* 03/17/2015 0635   GFRNONAA 11* 02/08/2015 1025   GFRAA 22* 03/17/2015 0635   GFRAA 13* 02/08/2015 1025    A/P: History of hypertension currently UNcontrolled on current medications due to noncompliance. Instructed patient to take his medications and to follow up with Dr. Adrian Blackwater next week. Drew a heart next to all of the blood pressure medications on his medication list so that patient could be sure to take those and know which ones were for his blood pressure. Also educated patient on the warning signs and symptoms of stroke. Patient provided written materials on stroke. Patient verbalized understanding.   Results reviewed and  written information provided. Total time in face-to-face counseling 20 minutes.   F/U Clinic Visit with Dr. Adrian Blackwater.

## 2015-06-30 DIAGNOSIS — N186 End stage renal disease: Secondary | ICD-10-CM | POA: Diagnosis not present

## 2015-06-30 DIAGNOSIS — N2581 Secondary hyperparathyroidism of renal origin: Secondary | ICD-10-CM | POA: Diagnosis not present

## 2015-06-30 DIAGNOSIS — E1129 Type 2 diabetes mellitus with other diabetic kidney complication: Secondary | ICD-10-CM | POA: Diagnosis not present

## 2015-06-30 DIAGNOSIS — D509 Iron deficiency anemia, unspecified: Secondary | ICD-10-CM | POA: Diagnosis not present

## 2015-06-30 DIAGNOSIS — Z23 Encounter for immunization: Secondary | ICD-10-CM | POA: Diagnosis not present

## 2015-07-02 DIAGNOSIS — Z23 Encounter for immunization: Secondary | ICD-10-CM | POA: Diagnosis not present

## 2015-07-02 DIAGNOSIS — D509 Iron deficiency anemia, unspecified: Secondary | ICD-10-CM | POA: Diagnosis not present

## 2015-07-02 DIAGNOSIS — N2581 Secondary hyperparathyroidism of renal origin: Secondary | ICD-10-CM | POA: Diagnosis not present

## 2015-07-02 DIAGNOSIS — N186 End stage renal disease: Secondary | ICD-10-CM | POA: Diagnosis not present

## 2015-07-02 DIAGNOSIS — E1129 Type 2 diabetes mellitus with other diabetic kidney complication: Secondary | ICD-10-CM | POA: Diagnosis not present

## 2015-07-04 DIAGNOSIS — N186 End stage renal disease: Secondary | ICD-10-CM | POA: Diagnosis not present

## 2015-07-04 DIAGNOSIS — E1122 Type 2 diabetes mellitus with diabetic chronic kidney disease: Secondary | ICD-10-CM | POA: Diagnosis not present

## 2015-07-04 DIAGNOSIS — Z992 Dependence on renal dialysis: Secondary | ICD-10-CM | POA: Diagnosis not present

## 2015-07-05 DIAGNOSIS — E1129 Type 2 diabetes mellitus with other diabetic kidney complication: Secondary | ICD-10-CM | POA: Diagnosis not present

## 2015-07-05 DIAGNOSIS — D509 Iron deficiency anemia, unspecified: Secondary | ICD-10-CM | POA: Diagnosis not present

## 2015-07-05 DIAGNOSIS — E8779 Other fluid overload: Secondary | ICD-10-CM | POA: Diagnosis not present

## 2015-07-05 DIAGNOSIS — Z23 Encounter for immunization: Secondary | ICD-10-CM | POA: Diagnosis not present

## 2015-07-05 DIAGNOSIS — N2581 Secondary hyperparathyroidism of renal origin: Secondary | ICD-10-CM | POA: Diagnosis not present

## 2015-07-05 DIAGNOSIS — N186 End stage renal disease: Secondary | ICD-10-CM | POA: Diagnosis not present

## 2015-07-06 ENCOUNTER — Ambulatory Visit: Payer: Medicare Other | Attending: Family Medicine | Admitting: Family Medicine

## 2015-07-06 ENCOUNTER — Encounter: Payer: Self-pay | Admitting: Family Medicine

## 2015-07-06 VITALS — BP 120/71 | HR 67 | Temp 97.4°F | Resp 16 | Ht 71.0 in | Wt 189.0 lb

## 2015-07-06 DIAGNOSIS — Z794 Long term (current) use of insulin: Secondary | ICD-10-CM | POA: Insufficient documentation

## 2015-07-06 DIAGNOSIS — N2889 Other specified disorders of kidney and ureter: Secondary | ICD-10-CM

## 2015-07-06 DIAGNOSIS — N186 End stage renal disease: Secondary | ICD-10-CM | POA: Diagnosis not present

## 2015-07-06 DIAGNOSIS — I151 Hypertension secondary to other renal disorders: Secondary | ICD-10-CM | POA: Diagnosis not present

## 2015-07-06 DIAGNOSIS — F321 Major depressive disorder, single episode, moderate: Secondary | ICD-10-CM | POA: Diagnosis not present

## 2015-07-06 DIAGNOSIS — E1129 Type 2 diabetes mellitus with other diabetic kidney complication: Secondary | ICD-10-CM | POA: Diagnosis not present

## 2015-07-06 DIAGNOSIS — N521 Erectile dysfunction due to diseases classified elsewhere: Secondary | ICD-10-CM | POA: Insufficient documentation

## 2015-07-06 DIAGNOSIS — Z992 Dependence on renal dialysis: Secondary | ICD-10-CM | POA: Insufficient documentation

## 2015-07-06 DIAGNOSIS — I132 Hypertensive heart and chronic kidney disease with heart failure and with stage 5 chronic kidney disease, or end stage renal disease: Secondary | ICD-10-CM | POA: Diagnosis not present

## 2015-07-06 DIAGNOSIS — I5032 Chronic diastolic (congestive) heart failure: Secondary | ICD-10-CM | POA: Diagnosis not present

## 2015-07-06 DIAGNOSIS — N529 Male erectile dysfunction, unspecified: Secondary | ICD-10-CM | POA: Insufficient documentation

## 2015-07-06 DIAGNOSIS — M25552 Pain in left hip: Secondary | ICD-10-CM | POA: Insufficient documentation

## 2015-07-06 DIAGNOSIS — K089 Disorder of teeth and supporting structures, unspecified: Secondary | ICD-10-CM | POA: Insufficient documentation

## 2015-07-06 LAB — GLUCOSE, POCT (MANUAL RESULT ENTRY): POC Glucose: 222 mg/dl — AB (ref 70–99)

## 2015-07-06 LAB — POCT GLYCOSYLATED HEMOGLOBIN (HGB A1C): Hemoglobin A1C: 7.5

## 2015-07-06 MED ORDER — NITROGLYCERIN 0.4 MG SL SUBL: 0.4000 mg | SUBLINGUAL_TABLET | SUBLINGUAL | Status: DC | PRN

## 2015-07-06 MED ORDER — DILTIAZEM HCL ER COATED BEADS 120 MG PO CP24: 120.0000 mg | ORAL_CAPSULE | Freq: Every day | ORAL | Status: DC

## 2015-07-06 MED ORDER — ACETAMINOPHEN-CODEINE #3 300-30 MG PO TABS: 1.0000 | ORAL_TABLET | Freq: Every evening | ORAL | Status: DC | PRN

## 2015-07-06 MED ORDER — SERTRALINE HCL 50 MG PO TABS
50.0000 mg | ORAL_TABLET | Freq: Every day | ORAL | Status: DC
Start: 2015-07-06 — End: 2016-07-20

## 2015-07-06 MED ORDER — SILDENAFIL CITRATE 50 MG PO TABS: 25.0000 mg | ORAL_TABLET | Freq: Every day | ORAL | Status: DC | PRN

## 2015-07-06 MED ORDER — ASPIRIN 81 MG PO CHEW: 81.0000 mg | CHEWABLE_TABLET | Freq: Once | ORAL | Status: DC

## 2015-07-06 NOTE — Patient Instructions (Signed)
[name] was seen today for hypertension.  Diagnoses and all orders for this visit:  Hypertension secondary to other renal disorders -     diltiazem (CARDIZEM CD) 120 MG 24 hr capsule; Take 1 capsule (120 mg total) by mouth daily.  Poor dentition  Controlled type 2 diabetes mellitus with other diabetic kidney complication, with long-term current use of insulin (HCC) -     POCT glucose (manual entry) -     POCT glycosylated hemoglobin (Hb A1C) -     aspirin 81 MG chewable tablet; Chew 1 tablet (81 mg total) by mouth once.  End stage renal disease on dialysis [facility]  Major depressive disorder, single episode, moderate (HCC) -     sertraline (ZOLOFT) 50 MG tablet; Take 1 tablet (50 mg total) by mouth daily.  Lateral pain of left hip -     acetaminophen-codeine (TYLENOL #3) 300-30 MG tablet; Take 1 tablet by mouth at bedtime as needed for moderate pain.  Erectile dysfunction due to diseases classified elsewhere -     Discontinue: sildenafil (VIAGRA) 50 MG tablet; Take 0.5-1 tablets (25-50 mg total) by mouth daily as needed for erectile dysfunction. -     sildenafil (VIAGRA) 50 MG tablet; Take 0.5-1 tablets (25-50 mg total) by mouth daily as needed for erectile dysfunction.   Never mix viagra with nitroglycerin due to risk of dangerous and life threatening drop in blood pressure Continue to not use cocaine or alcohol   Sugar is running high. Avoid candy and sweets.   F/u in 2 months   Dr. Adrian Blackwater     Do

## 2015-07-06 NOTE — Progress Notes (Signed)
Patient ID: [name], male   DOB: [birth year], 54 y.o.   MRN: [id number]   Subjective:  Patient ID: [name], male    DOB: 06/29/1961  Age: 54 y.o. MRN: [id number]  CC: Hypertension   HPI [name] presents for   1. CHRONIC HYPERTENSION and DM2 with ESRD-DD TTS  Disease Monitoring  Blood pressure range: low with HD to the point that HD has to be stopped early. High when not compliant with regimen. He is confused about when to take his medications.   Chest pain: no   Dyspnea: no   Claudication: no   Medication compliance: yes for past week  Medication Side Effects  Lightheadedness: no   Urinary frequency: no   Edema: no   Impotence: yes   2. DM2: no medications. Eating a lot of candy lately. No change in vision. No polyuria polydipsia.   3. ED: trouble getting and maintaining erection. Has diabetes, HTN, ESRD-DD. Has hx of cocaine and ETOH abuse. Has CP in setting of cocaine abuse. No longer using. No CP lately.  4. L lateral hip pain: when lying on L sided at night. No pain during the day. No fall or recent injury. No rash.    Social History  Substance Use Topics  . Smoking status: Never Smoker   . Smokeless tobacco: Never Used  . Alcohol Use: No     Comment: once in a while per patient "beer/brandy"    Outpatient Prescriptions Prior to Visit  Medication Sig Dispense Refill  . calcium acetate (PHOSLO) 667 MG capsule Take 2 capsules (1,334 mg total) by mouth 3 (three) times daily with meals. 180 capsule 0  . carvedilol (COREG) 25 MG tablet Take 25 mg by mouth 2 (two) times daily with a meal.    . diltiazem (CARDIZEM CD) 180 MG 24 hr capsule Take 1 capsule (180 mg total) by mouth daily. 30 capsule 3  . losartan (COZAAR) 25 MG tablet Take 25 mg by mouth daily.    Marland Kitchen aspirin 81 MG chewable tablet Chew 324 mg by mouth once.    Marland Kitchen atorvastatin (LIPITOR) 80 MG tablet Take 1 tablet (80 mg total) by mouth daily at 6 PM. (Patient not taking:  Reported on 06/29/2015) 30 tablet 0  . calcitRIOL (ROCALTROL) 0.25 MCG capsule Take 1 capsule (0.25 mcg total) by mouth daily. (Patient not taking: Reported on 06/29/2015) 30 capsule 11  . famotidine (PEPCID) 20 MG tablet Take 40 mg by mouth 2 (two) times daily.    Marland Kitchen FLUoxetine (PROZAC) 20 MG capsule Take 1 capsule (20 mg total) by mouth daily. (Patient not taking: Reported on 06/29/2015) 30 capsule 3  . furosemide (LASIX) 80 MG tablet Take 80 mg by mouth.    . hydrALAZINE (APRESOLINE) 100 MG tablet Take 1 tablet (100 mg total) by mouth 3 (three) times daily. (Patient not taking: Reported on 07/06/2015) 90 tablet 5  . mirtazapine (REMERON) 30 MG tablet Take 30 mg by mouth at bedtime.    . montelukast (SINGULAIR) 10 MG tablet Take 10 mg by mouth at bedtime.    . nitroGLYCERIN (NITROSTAT) 0.4 MG SL tablet Place 0.4 mg under the tongue every 5 (five) minutes as needed for chest pain.     No facility-administered medications prior to visit.    ROS Review of Systems  Constitutional: Negative for fever, chills, fatigue and unexpected weight change.  Eyes: Negative for visual disturbance.  Respiratory: Negative for cough and shortness of breath.  Cardiovascular: Negative for chest pain, palpitations and leg swelling.  Gastrointestinal: Negative for nausea, vomiting, abdominal pain, diarrhea, constipation and blood in stool.  Endocrine: Negative for polydipsia, polyphagia and polyuria.  Musculoskeletal: Positive for arthralgias. Negative for myalgias, back pain, gait problem and neck pain.  Skin: Negative for rash.  Allergic/Immunologic: Negative for immunocompromised state.  Hematological: Negative for adenopathy. Does not bruise/bleed easily.  Psychiatric/Behavioral: Negative for suicidal ideas, sleep disturbance and dysphoric mood. The patient is not nervous/anxious.     Objective:  BP 120/71 mmHg  Pulse 67  Temp(Src) 97.4 F (36.3 C) (Oral)  Resp 16  Ht 5\' 11"  (1.803 m)  Wt 189 lb  (85.73 kg)  BMI 26.37 kg/m2  SpO2 97%  BP/Weight 07/06/2015 06/29/2015 09/05/7251  Systolic BP 664 403 474  Diastolic BP 71 81 80  Wt. (Lbs) 189 - 180  BMI 26.37 - 25.12  Some encounter information is confidential and restricted. Go to Review Flowsheets activity to see all data.    Physical Exam  Constitutional: He appears well-developed and well-nourished. No distress.  HENT:  Head: Normocephalic and atraumatic.  Neck: Normal range of motion. Neck supple.  Cardiovascular: Normal rate, regular rhythm, normal heart sounds and intact distal pulses.   Palpable bruit and audible thrill in RUE AV fistula   Pulmonary/Chest: Effort normal and breath sounds normal.  Musculoskeletal: He exhibits no edema.  Neurological: He is alert.  Skin: Skin is warm and dry. No rash noted. No erythema.  Psychiatric: He has a normal mood and affect.   Lab Results  Component Value Date   HGBA1C 6.0 02/09/2015   Lab Results  Component Value Date   HGBA1C 7.50 07/06/2015   CBG 222 Assessment & Plan:   Problem List Items Addressed This Visit    Chronic diastolic congestive heart failure (HCC)   Relevant Medications   aspirin 81 MG chewable tablet   diltiazem (CARDIZEM CD) 120 MG 24 hr capsule   sildenafil (VIAGRA) 50 MG tablet   nitroGLYCERIN (NITROSTAT) 0.4 MG SL tablet   End stage renal disease on dialysis (HCC) (Chronic)   Hypertension - Primary (Chronic)   Relevant Medications   aspirin 81 MG chewable tablet   diltiazem (CARDIZEM CD) 120 MG 24 hr capsule   sildenafil (VIAGRA) 50 MG tablet   nitroGLYCERIN (NITROSTAT) 0.4 MG SL tablet   Lateral pain of left hip   Relevant Medications   acetaminophen-codeine (TYLENOL #3) 300-30 MG tablet   Major depressive disorder, single episode, moderate (HCC)   Relevant Medications   sertraline (ZOLOFT) 50 MG tablet   Poor dentition   Type 2 diabetes, controlled, with renal manifestation (HCC) (Chronic)   Relevant Medications   aspirin 81 MG  chewable tablet   Other Relevant Orders   POCT glucose (manual entry) (Completed)   POCT glycosylated hemoglobin (Hb A1C) (Completed)    Other Visit Diagnoses    Erectile dysfunction due to diseases classified elsewhere        Relevant Medications    sildenafil (VIAGRA) 50 MG tablet       No orders of the defined types were placed in this encounter.    Follow-up: No Follow-up on file.   [name] MD

## 2015-07-06 NOTE — Assessment & Plan Note (Signed)
A: suspect trochanteric bursitis P: nightly tylenol #3 for prn pain. Steroid injection of bursa if this does not help

## 2015-07-06 NOTE — Progress Notes (Signed)
C/C elevated BP  Stated BP been running high  No Hx tobacco  No pain  Referral Dental

## 2015-07-06 NOTE — Assessment & Plan Note (Signed)
Sugar is running high. Avoid candy and sweets.

## 2015-07-06 NOTE — Assessment & Plan Note (Signed)
Never mix viagra with nitroglycerin due to risk of dangerous and life threatening drop in blood pressure Continue to not use cocaine or alcohol

## 2015-07-07 DIAGNOSIS — N186 End stage renal disease: Secondary | ICD-10-CM | POA: Diagnosis not present

## 2015-07-07 DIAGNOSIS — E8779 Other fluid overload: Secondary | ICD-10-CM | POA: Diagnosis not present

## 2015-07-07 DIAGNOSIS — N2581 Secondary hyperparathyroidism of renal origin: Secondary | ICD-10-CM | POA: Diagnosis not present

## 2015-07-07 DIAGNOSIS — Z23 Encounter for immunization: Secondary | ICD-10-CM | POA: Diagnosis not present

## 2015-07-07 DIAGNOSIS — D509 Iron deficiency anemia, unspecified: Secondary | ICD-10-CM | POA: Diagnosis not present

## 2015-07-07 DIAGNOSIS — E1129 Type 2 diabetes mellitus with other diabetic kidney complication: Secondary | ICD-10-CM | POA: Diagnosis not present

## 2015-07-08 DIAGNOSIS — N186 End stage renal disease: Secondary | ICD-10-CM | POA: Diagnosis not present

## 2015-07-08 DIAGNOSIS — I871 Compression of vein: Secondary | ICD-10-CM | POA: Diagnosis not present

## 2015-07-08 DIAGNOSIS — T82858D Stenosis of vascular prosthetic devices, implants and grafts, subsequent encounter: Secondary | ICD-10-CM | POA: Diagnosis not present

## 2015-07-08 DIAGNOSIS — Z992 Dependence on renal dialysis: Secondary | ICD-10-CM | POA: Diagnosis not present

## 2015-07-09 DIAGNOSIS — E8779 Other fluid overload: Secondary | ICD-10-CM | POA: Diagnosis not present

## 2015-07-09 DIAGNOSIS — N2581 Secondary hyperparathyroidism of renal origin: Secondary | ICD-10-CM | POA: Diagnosis not present

## 2015-07-09 DIAGNOSIS — D509 Iron deficiency anemia, unspecified: Secondary | ICD-10-CM | POA: Diagnosis not present

## 2015-07-09 DIAGNOSIS — Z23 Encounter for immunization: Secondary | ICD-10-CM | POA: Diagnosis not present

## 2015-07-09 DIAGNOSIS — E1129 Type 2 diabetes mellitus with other diabetic kidney complication: Secondary | ICD-10-CM | POA: Diagnosis not present

## 2015-07-09 DIAGNOSIS — N186 End stage renal disease: Secondary | ICD-10-CM | POA: Diagnosis not present

## 2015-07-12 DIAGNOSIS — Z23 Encounter for immunization: Secondary | ICD-10-CM | POA: Diagnosis not present

## 2015-07-12 DIAGNOSIS — E8779 Other fluid overload: Secondary | ICD-10-CM | POA: Diagnosis not present

## 2015-07-12 DIAGNOSIS — N2581 Secondary hyperparathyroidism of renal origin: Secondary | ICD-10-CM | POA: Diagnosis not present

## 2015-07-12 DIAGNOSIS — N186 End stage renal disease: Secondary | ICD-10-CM | POA: Diagnosis not present

## 2015-07-12 DIAGNOSIS — E1129 Type 2 diabetes mellitus with other diabetic kidney complication: Secondary | ICD-10-CM | POA: Diagnosis not present

## 2015-07-12 DIAGNOSIS — D509 Iron deficiency anemia, unspecified: Secondary | ICD-10-CM | POA: Diagnosis not present

## 2015-07-14 DIAGNOSIS — Z23 Encounter for immunization: Secondary | ICD-10-CM | POA: Diagnosis not present

## 2015-07-14 DIAGNOSIS — N186 End stage renal disease: Secondary | ICD-10-CM | POA: Diagnosis not present

## 2015-07-14 DIAGNOSIS — N2581 Secondary hyperparathyroidism of renal origin: Secondary | ICD-10-CM | POA: Diagnosis not present

## 2015-07-14 DIAGNOSIS — D509 Iron deficiency anemia, unspecified: Secondary | ICD-10-CM | POA: Diagnosis not present

## 2015-07-14 DIAGNOSIS — E8779 Other fluid overload: Secondary | ICD-10-CM | POA: Diagnosis not present

## 2015-07-14 DIAGNOSIS — E1129 Type 2 diabetes mellitus with other diabetic kidney complication: Secondary | ICD-10-CM | POA: Diagnosis not present

## 2015-07-16 DIAGNOSIS — N186 End stage renal disease: Secondary | ICD-10-CM | POA: Diagnosis not present

## 2015-07-16 DIAGNOSIS — D509 Iron deficiency anemia, unspecified: Secondary | ICD-10-CM | POA: Diagnosis not present

## 2015-07-16 DIAGNOSIS — E1129 Type 2 diabetes mellitus with other diabetic kidney complication: Secondary | ICD-10-CM | POA: Diagnosis not present

## 2015-07-16 DIAGNOSIS — E8779 Other fluid overload: Secondary | ICD-10-CM | POA: Diagnosis not present

## 2015-07-16 DIAGNOSIS — N2581 Secondary hyperparathyroidism of renal origin: Secondary | ICD-10-CM | POA: Diagnosis not present

## 2015-07-16 DIAGNOSIS — Z23 Encounter for immunization: Secondary | ICD-10-CM | POA: Diagnosis not present

## 2015-07-19 DIAGNOSIS — Z23 Encounter for immunization: Secondary | ICD-10-CM | POA: Diagnosis not present

## 2015-07-19 DIAGNOSIS — E1129 Type 2 diabetes mellitus with other diabetic kidney complication: Secondary | ICD-10-CM | POA: Diagnosis not present

## 2015-07-19 DIAGNOSIS — E8779 Other fluid overload: Secondary | ICD-10-CM | POA: Diagnosis not present

## 2015-07-19 DIAGNOSIS — N186 End stage renal disease: Secondary | ICD-10-CM | POA: Diagnosis not present

## 2015-07-19 DIAGNOSIS — N2581 Secondary hyperparathyroidism of renal origin: Secondary | ICD-10-CM | POA: Diagnosis not present

## 2015-07-19 DIAGNOSIS — D509 Iron deficiency anemia, unspecified: Secondary | ICD-10-CM | POA: Diagnosis not present

## 2015-07-21 DIAGNOSIS — Z23 Encounter for immunization: Secondary | ICD-10-CM | POA: Diagnosis not present

## 2015-07-21 DIAGNOSIS — D509 Iron deficiency anemia, unspecified: Secondary | ICD-10-CM | POA: Diagnosis not present

## 2015-07-21 DIAGNOSIS — N2581 Secondary hyperparathyroidism of renal origin: Secondary | ICD-10-CM | POA: Diagnosis not present

## 2015-07-21 DIAGNOSIS — N186 End stage renal disease: Secondary | ICD-10-CM | POA: Diagnosis not present

## 2015-07-21 DIAGNOSIS — E8779 Other fluid overload: Secondary | ICD-10-CM | POA: Diagnosis not present

## 2015-07-21 DIAGNOSIS — E1129 Type 2 diabetes mellitus with other diabetic kidney complication: Secondary | ICD-10-CM | POA: Diagnosis not present

## 2015-07-23 DIAGNOSIS — D509 Iron deficiency anemia, unspecified: Secondary | ICD-10-CM | POA: Diagnosis not present

## 2015-07-23 DIAGNOSIS — Z23 Encounter for immunization: Secondary | ICD-10-CM | POA: Diagnosis not present

## 2015-07-23 DIAGNOSIS — N186 End stage renal disease: Secondary | ICD-10-CM | POA: Diagnosis not present

## 2015-07-23 DIAGNOSIS — E8779 Other fluid overload: Secondary | ICD-10-CM | POA: Diagnosis not present

## 2015-07-23 DIAGNOSIS — N2581 Secondary hyperparathyroidism of renal origin: Secondary | ICD-10-CM | POA: Diagnosis not present

## 2015-07-23 DIAGNOSIS — E1129 Type 2 diabetes mellitus with other diabetic kidney complication: Secondary | ICD-10-CM | POA: Diagnosis not present

## 2015-07-26 DIAGNOSIS — E8779 Other fluid overload: Secondary | ICD-10-CM | POA: Diagnosis not present

## 2015-07-26 DIAGNOSIS — E1129 Type 2 diabetes mellitus with other diabetic kidney complication: Secondary | ICD-10-CM | POA: Diagnosis not present

## 2015-07-26 DIAGNOSIS — Z23 Encounter for immunization: Secondary | ICD-10-CM | POA: Diagnosis not present

## 2015-07-26 DIAGNOSIS — D509 Iron deficiency anemia, unspecified: Secondary | ICD-10-CM | POA: Diagnosis not present

## 2015-07-26 DIAGNOSIS — N2581 Secondary hyperparathyroidism of renal origin: Secondary | ICD-10-CM | POA: Diagnosis not present

## 2015-07-26 DIAGNOSIS — N186 End stage renal disease: Secondary | ICD-10-CM | POA: Diagnosis not present

## 2015-07-29 DIAGNOSIS — N2581 Secondary hyperparathyroidism of renal origin: Secondary | ICD-10-CM | POA: Diagnosis not present

## 2015-07-29 DIAGNOSIS — N186 End stage renal disease: Secondary | ICD-10-CM | POA: Diagnosis not present

## 2015-07-29 DIAGNOSIS — D509 Iron deficiency anemia, unspecified: Secondary | ICD-10-CM | POA: Diagnosis not present

## 2015-07-29 DIAGNOSIS — E1129 Type 2 diabetes mellitus with other diabetic kidney complication: Secondary | ICD-10-CM | POA: Diagnosis not present

## 2015-07-29 DIAGNOSIS — E8779 Other fluid overload: Secondary | ICD-10-CM | POA: Diagnosis not present

## 2015-07-29 DIAGNOSIS — Z23 Encounter for immunization: Secondary | ICD-10-CM | POA: Diagnosis not present

## 2015-07-30 DIAGNOSIS — N2581 Secondary hyperparathyroidism of renal origin: Secondary | ICD-10-CM | POA: Diagnosis not present

## 2015-07-30 DIAGNOSIS — E1129 Type 2 diabetes mellitus with other diabetic kidney complication: Secondary | ICD-10-CM | POA: Diagnosis not present

## 2015-07-30 DIAGNOSIS — D509 Iron deficiency anemia, unspecified: Secondary | ICD-10-CM | POA: Diagnosis not present

## 2015-07-30 DIAGNOSIS — E8779 Other fluid overload: Secondary | ICD-10-CM | POA: Diagnosis not present

## 2015-07-30 DIAGNOSIS — N186 End stage renal disease: Secondary | ICD-10-CM | POA: Diagnosis not present

## 2015-07-30 DIAGNOSIS — Z23 Encounter for immunization: Secondary | ICD-10-CM | POA: Diagnosis not present

## 2015-07-31 DIAGNOSIS — Z23 Encounter for immunization: Secondary | ICD-10-CM | POA: Diagnosis not present

## 2015-07-31 DIAGNOSIS — N186 End stage renal disease: Secondary | ICD-10-CM | POA: Diagnosis not present

## 2015-07-31 DIAGNOSIS — E1129 Type 2 diabetes mellitus with other diabetic kidney complication: Secondary | ICD-10-CM | POA: Diagnosis not present

## 2015-07-31 DIAGNOSIS — E8779 Other fluid overload: Secondary | ICD-10-CM | POA: Diagnosis not present

## 2015-07-31 DIAGNOSIS — N2581 Secondary hyperparathyroidism of renal origin: Secondary | ICD-10-CM | POA: Diagnosis not present

## 2015-07-31 DIAGNOSIS — D509 Iron deficiency anemia, unspecified: Secondary | ICD-10-CM | POA: Diagnosis not present

## 2015-08-01 ENCOUNTER — Telehealth: Payer: Self-pay | Admitting: Family Medicine

## 2015-08-01 DIAGNOSIS — Z794 Long term (current) use of insulin: Secondary | ICD-10-CM

## 2015-08-01 DIAGNOSIS — E1129 Type 2 diabetes mellitus with other diabetic kidney complication: Secondary | ICD-10-CM

## 2015-08-01 DIAGNOSIS — K089 Disorder of teeth and supporting structures, unspecified: Secondary | ICD-10-CM

## 2015-08-01 NOTE — Telephone Encounter (Signed)
Needs records of flu shot.

## 2015-08-01 NOTE — Telephone Encounter (Signed)
Correction: [facility]. They would like flu shot faxed to [phone]. Thank you!

## 2015-08-01 NOTE — Telephone Encounter (Signed)
Patient called requesting a referral for a Dentist, patient also needs a referral for Podiatrist Please follow up with patient

## 2015-08-02 DIAGNOSIS — N186 End stage renal disease: Secondary | ICD-10-CM | POA: Diagnosis not present

## 2015-08-02 DIAGNOSIS — E1129 Type 2 diabetes mellitus with other diabetic kidney complication: Secondary | ICD-10-CM | POA: Diagnosis not present

## 2015-08-02 DIAGNOSIS — N2581 Secondary hyperparathyroidism of renal origin: Secondary | ICD-10-CM | POA: Diagnosis not present

## 2015-08-02 DIAGNOSIS — Z23 Encounter for immunization: Secondary | ICD-10-CM | POA: Diagnosis not present

## 2015-08-02 DIAGNOSIS — E8779 Other fluid overload: Secondary | ICD-10-CM | POA: Diagnosis not present

## 2015-08-02 DIAGNOSIS — D509 Iron deficiency anemia, unspecified: Secondary | ICD-10-CM | POA: Diagnosis not present

## 2015-08-03 DIAGNOSIS — Z992 Dependence on renal dialysis: Secondary | ICD-10-CM | POA: Diagnosis not present

## 2015-08-03 DIAGNOSIS — N186 End stage renal disease: Secondary | ICD-10-CM | POA: Diagnosis not present

## 2015-08-03 DIAGNOSIS — E1122 Type 2 diabetes mellitus with diabetic chronic kidney disease: Secondary | ICD-10-CM | POA: Diagnosis not present

## 2015-08-03 NOTE — Telephone Encounter (Signed)
Referrals placed. Please inform patient

## 2015-08-04 DIAGNOSIS — E1129 Type 2 diabetes mellitus with other diabetic kidney complication: Secondary | ICD-10-CM | POA: Diagnosis not present

## 2015-08-04 DIAGNOSIS — D509 Iron deficiency anemia, unspecified: Secondary | ICD-10-CM | POA: Diagnosis not present

## 2015-08-04 DIAGNOSIS — N2581 Secondary hyperparathyroidism of renal origin: Secondary | ICD-10-CM | POA: Diagnosis not present

## 2015-08-04 DIAGNOSIS — D631 Anemia in chronic kidney disease: Secondary | ICD-10-CM | POA: Diagnosis not present

## 2015-08-04 DIAGNOSIS — N186 End stage renal disease: Secondary | ICD-10-CM | POA: Diagnosis not present

## 2015-08-06 DIAGNOSIS — E1129 Type 2 diabetes mellitus with other diabetic kidney complication: Secondary | ICD-10-CM | POA: Diagnosis not present

## 2015-08-06 DIAGNOSIS — D509 Iron deficiency anemia, unspecified: Secondary | ICD-10-CM | POA: Diagnosis not present

## 2015-08-06 DIAGNOSIS — D631 Anemia in chronic kidney disease: Secondary | ICD-10-CM | POA: Diagnosis not present

## 2015-08-06 DIAGNOSIS — N2581 Secondary hyperparathyroidism of renal origin: Secondary | ICD-10-CM | POA: Diagnosis not present

## 2015-08-06 DIAGNOSIS — N186 End stage renal disease: Secondary | ICD-10-CM | POA: Diagnosis not present

## 2015-08-09 DIAGNOSIS — D509 Iron deficiency anemia, unspecified: Secondary | ICD-10-CM | POA: Diagnosis not present

## 2015-08-09 DIAGNOSIS — N186 End stage renal disease: Secondary | ICD-10-CM | POA: Diagnosis not present

## 2015-08-09 DIAGNOSIS — E1129 Type 2 diabetes mellitus with other diabetic kidney complication: Secondary | ICD-10-CM | POA: Diagnosis not present

## 2015-08-09 DIAGNOSIS — D631 Anemia in chronic kidney disease: Secondary | ICD-10-CM | POA: Diagnosis not present

## 2015-08-09 DIAGNOSIS — N2581 Secondary hyperparathyroidism of renal origin: Secondary | ICD-10-CM | POA: Diagnosis not present

## 2015-08-11 DIAGNOSIS — E1129 Type 2 diabetes mellitus with other diabetic kidney complication: Secondary | ICD-10-CM | POA: Diagnosis not present

## 2015-08-11 DIAGNOSIS — D509 Iron deficiency anemia, unspecified: Secondary | ICD-10-CM | POA: Diagnosis not present

## 2015-08-11 DIAGNOSIS — N2581 Secondary hyperparathyroidism of renal origin: Secondary | ICD-10-CM | POA: Diagnosis not present

## 2015-08-11 DIAGNOSIS — N186 End stage renal disease: Secondary | ICD-10-CM | POA: Diagnosis not present

## 2015-08-11 DIAGNOSIS — D631 Anemia in chronic kidney disease: Secondary | ICD-10-CM | POA: Diagnosis not present

## 2015-08-13 DIAGNOSIS — E1129 Type 2 diabetes mellitus with other diabetic kidney complication: Secondary | ICD-10-CM | POA: Diagnosis not present

## 2015-08-13 DIAGNOSIS — N186 End stage renal disease: Secondary | ICD-10-CM | POA: Diagnosis not present

## 2015-08-13 DIAGNOSIS — D631 Anemia in chronic kidney disease: Secondary | ICD-10-CM | POA: Diagnosis not present

## 2015-08-13 DIAGNOSIS — D509 Iron deficiency anemia, unspecified: Secondary | ICD-10-CM | POA: Diagnosis not present

## 2015-08-13 DIAGNOSIS — N2581 Secondary hyperparathyroidism of renal origin: Secondary | ICD-10-CM | POA: Diagnosis not present

## 2015-08-16 DIAGNOSIS — N2581 Secondary hyperparathyroidism of renal origin: Secondary | ICD-10-CM | POA: Diagnosis not present

## 2015-08-16 DIAGNOSIS — D631 Anemia in chronic kidney disease: Secondary | ICD-10-CM | POA: Diagnosis not present

## 2015-08-16 DIAGNOSIS — E1129 Type 2 diabetes mellitus with other diabetic kidney complication: Secondary | ICD-10-CM | POA: Diagnosis not present

## 2015-08-16 DIAGNOSIS — N186 End stage renal disease: Secondary | ICD-10-CM | POA: Diagnosis not present

## 2015-08-16 DIAGNOSIS — D509 Iron deficiency anemia, unspecified: Secondary | ICD-10-CM | POA: Diagnosis not present

## 2015-08-18 DIAGNOSIS — E1129 Type 2 diabetes mellitus with other diabetic kidney complication: Secondary | ICD-10-CM | POA: Diagnosis not present

## 2015-08-18 DIAGNOSIS — N2581 Secondary hyperparathyroidism of renal origin: Secondary | ICD-10-CM | POA: Diagnosis not present

## 2015-08-18 DIAGNOSIS — D509 Iron deficiency anemia, unspecified: Secondary | ICD-10-CM | POA: Diagnosis not present

## 2015-08-18 DIAGNOSIS — D631 Anemia in chronic kidney disease: Secondary | ICD-10-CM | POA: Diagnosis not present

## 2015-08-18 DIAGNOSIS — N186 End stage renal disease: Secondary | ICD-10-CM | POA: Diagnosis not present

## 2015-08-20 DIAGNOSIS — D631 Anemia in chronic kidney disease: Secondary | ICD-10-CM | POA: Diagnosis not present

## 2015-08-20 DIAGNOSIS — D509 Iron deficiency anemia, unspecified: Secondary | ICD-10-CM | POA: Diagnosis not present

## 2015-08-20 DIAGNOSIS — E1129 Type 2 diabetes mellitus with other diabetic kidney complication: Secondary | ICD-10-CM | POA: Diagnosis not present

## 2015-08-20 DIAGNOSIS — N2581 Secondary hyperparathyroidism of renal origin: Secondary | ICD-10-CM | POA: Diagnosis not present

## 2015-08-20 DIAGNOSIS — N186 End stage renal disease: Secondary | ICD-10-CM | POA: Diagnosis not present

## 2015-08-23 DIAGNOSIS — N186 End stage renal disease: Secondary | ICD-10-CM | POA: Diagnosis not present

## 2015-08-23 DIAGNOSIS — D631 Anemia in chronic kidney disease: Secondary | ICD-10-CM | POA: Diagnosis not present

## 2015-08-23 DIAGNOSIS — D509 Iron deficiency anemia, unspecified: Secondary | ICD-10-CM | POA: Diagnosis not present

## 2015-08-23 DIAGNOSIS — E1129 Type 2 diabetes mellitus with other diabetic kidney complication: Secondary | ICD-10-CM | POA: Diagnosis not present

## 2015-08-23 DIAGNOSIS — N2581 Secondary hyperparathyroidism of renal origin: Secondary | ICD-10-CM | POA: Diagnosis not present

## 2015-08-25 DIAGNOSIS — E1129 Type 2 diabetes mellitus with other diabetic kidney complication: Secondary | ICD-10-CM | POA: Diagnosis not present

## 2015-08-25 DIAGNOSIS — D509 Iron deficiency anemia, unspecified: Secondary | ICD-10-CM | POA: Diagnosis not present

## 2015-08-25 DIAGNOSIS — D631 Anemia in chronic kidney disease: Secondary | ICD-10-CM | POA: Diagnosis not present

## 2015-08-25 DIAGNOSIS — N186 End stage renal disease: Secondary | ICD-10-CM | POA: Diagnosis not present

## 2015-08-25 DIAGNOSIS — N2581 Secondary hyperparathyroidism of renal origin: Secondary | ICD-10-CM | POA: Diagnosis not present

## 2015-08-27 DIAGNOSIS — N186 End stage renal disease: Secondary | ICD-10-CM | POA: Diagnosis not present

## 2015-08-27 DIAGNOSIS — N2581 Secondary hyperparathyroidism of renal origin: Secondary | ICD-10-CM | POA: Diagnosis not present

## 2015-08-27 DIAGNOSIS — D631 Anemia in chronic kidney disease: Secondary | ICD-10-CM | POA: Diagnosis not present

## 2015-08-27 DIAGNOSIS — E1129 Type 2 diabetes mellitus with other diabetic kidney complication: Secondary | ICD-10-CM | POA: Diagnosis not present

## 2015-08-27 DIAGNOSIS — D509 Iron deficiency anemia, unspecified: Secondary | ICD-10-CM | POA: Diagnosis not present

## 2015-08-30 DIAGNOSIS — D631 Anemia in chronic kidney disease: Secondary | ICD-10-CM | POA: Diagnosis not present

## 2015-08-30 DIAGNOSIS — E1129 Type 2 diabetes mellitus with other diabetic kidney complication: Secondary | ICD-10-CM | POA: Diagnosis not present

## 2015-08-30 DIAGNOSIS — N186 End stage renal disease: Secondary | ICD-10-CM | POA: Diagnosis not present

## 2015-08-30 DIAGNOSIS — N2581 Secondary hyperparathyroidism of renal origin: Secondary | ICD-10-CM | POA: Diagnosis not present

## 2015-08-30 DIAGNOSIS — D509 Iron deficiency anemia, unspecified: Secondary | ICD-10-CM | POA: Diagnosis not present

## 2015-09-01 DIAGNOSIS — E1129 Type 2 diabetes mellitus with other diabetic kidney complication: Secondary | ICD-10-CM | POA: Diagnosis not present

## 2015-09-01 DIAGNOSIS — D631 Anemia in chronic kidney disease: Secondary | ICD-10-CM | POA: Diagnosis not present

## 2015-09-01 DIAGNOSIS — N2581 Secondary hyperparathyroidism of renal origin: Secondary | ICD-10-CM | POA: Diagnosis not present

## 2015-09-01 DIAGNOSIS — D509 Iron deficiency anemia, unspecified: Secondary | ICD-10-CM | POA: Diagnosis not present

## 2015-09-01 DIAGNOSIS — N186 End stage renal disease: Secondary | ICD-10-CM | POA: Diagnosis not present

## 2015-09-03 DIAGNOSIS — Z992 Dependence on renal dialysis: Secondary | ICD-10-CM | POA: Diagnosis not present

## 2015-09-03 DIAGNOSIS — D509 Iron deficiency anemia, unspecified: Secondary | ICD-10-CM | POA: Diagnosis not present

## 2015-09-03 DIAGNOSIS — E1129 Type 2 diabetes mellitus with other diabetic kidney complication: Secondary | ICD-10-CM | POA: Diagnosis not present

## 2015-09-03 DIAGNOSIS — E1122 Type 2 diabetes mellitus with diabetic chronic kidney disease: Secondary | ICD-10-CM | POA: Diagnosis not present

## 2015-09-03 DIAGNOSIS — D631 Anemia in chronic kidney disease: Secondary | ICD-10-CM | POA: Diagnosis not present

## 2015-09-03 DIAGNOSIS — N2581 Secondary hyperparathyroidism of renal origin: Secondary | ICD-10-CM | POA: Diagnosis not present

## 2015-09-03 DIAGNOSIS — N186 End stage renal disease: Secondary | ICD-10-CM | POA: Diagnosis not present

## 2015-09-06 DIAGNOSIS — D631 Anemia in chronic kidney disease: Secondary | ICD-10-CM | POA: Diagnosis not present

## 2015-09-06 DIAGNOSIS — D509 Iron deficiency anemia, unspecified: Secondary | ICD-10-CM | POA: Diagnosis not present

## 2015-09-06 DIAGNOSIS — E1129 Type 2 diabetes mellitus with other diabetic kidney complication: Secondary | ICD-10-CM | POA: Diagnosis not present

## 2015-09-06 DIAGNOSIS — N186 End stage renal disease: Secondary | ICD-10-CM | POA: Diagnosis not present

## 2015-09-06 DIAGNOSIS — N2581 Secondary hyperparathyroidism of renal origin: Secondary | ICD-10-CM | POA: Diagnosis not present

## 2015-09-07 ENCOUNTER — Encounter: Payer: Self-pay | Admitting: Family Medicine

## 2015-09-07 ENCOUNTER — Encounter: Payer: Self-pay | Admitting: Podiatry

## 2015-09-07 ENCOUNTER — Ambulatory Visit: Payer: Medicare Other | Attending: Family Medicine | Admitting: Family Medicine

## 2015-09-07 ENCOUNTER — Ambulatory Visit (INDEPENDENT_AMBULATORY_CARE_PROVIDER_SITE_OTHER): Payer: Medicare Other | Admitting: Podiatry

## 2015-09-07 VITALS — BP 98/66 | HR 100 | Temp 97.8°F | Resp 13 | Ht 71.0 in | Wt 196.2 lb

## 2015-09-07 VITALS — BP 143/86 | HR 69 | Resp 12

## 2015-09-07 DIAGNOSIS — Z992 Dependence on renal dialysis: Secondary | ICD-10-CM | POA: Diagnosis not present

## 2015-09-07 DIAGNOSIS — N289 Disorder of kidney and ureter, unspecified: Secondary | ICD-10-CM | POA: Insufficient documentation

## 2015-09-07 DIAGNOSIS — M2042 Other hammer toe(s) (acquired), left foot: Secondary | ICD-10-CM | POA: Diagnosis not present

## 2015-09-07 DIAGNOSIS — Z9889 Other specified postprocedural states: Secondary | ICD-10-CM | POA: Diagnosis not present

## 2015-09-07 DIAGNOSIS — L609 Nail disorder, unspecified: Secondary | ICD-10-CM

## 2015-09-07 DIAGNOSIS — M25561 Pain in right knee: Secondary | ICD-10-CM | POA: Diagnosis not present

## 2015-09-07 DIAGNOSIS — N184 Chronic kidney disease, stage 4 (severe): Secondary | ICD-10-CM

## 2015-09-07 DIAGNOSIS — R0989 Other specified symptoms and signs involving the circulatory and respiratory systems: Secondary | ICD-10-CM | POA: Diagnosis not present

## 2015-09-07 DIAGNOSIS — L608 Other nail disorders: Secondary | ICD-10-CM

## 2015-09-07 DIAGNOSIS — E1122 Type 2 diabetes mellitus with diabetic chronic kidney disease: Secondary | ICD-10-CM

## 2015-09-07 DIAGNOSIS — I129 Hypertensive chronic kidney disease with stage 1 through stage 4 chronic kidney disease, or unspecified chronic kidney disease: Secondary | ICD-10-CM | POA: Insufficient documentation

## 2015-09-07 DIAGNOSIS — Z7982 Long term (current) use of aspirin: Secondary | ICD-10-CM | POA: Diagnosis not present

## 2015-09-07 DIAGNOSIS — Z79899 Other long term (current) drug therapy: Secondary | ICD-10-CM | POA: Insufficient documentation

## 2015-09-07 LAB — GLUCOSE, POCT (MANUAL RESULT ENTRY): POC Glucose: 76 mg/dl (ref 70–99)

## 2015-09-07 MED ORDER — PREDNISONE 20 MG PO TABS: 20.0000 mg | ORAL_TABLET | Freq: Every day | ORAL | Status: DC

## 2015-09-07 MED ORDER — ACETAMINOPHEN-CODEINE #3 300-30 MG PO TABS: 1.0000 | ORAL_TABLET | Freq: Every evening | ORAL | Status: DC | PRN

## 2015-09-07 MED FILL — ACETAMINOPHEN/COD #3 TABLET: 300-30 | 30 days supply | Qty: 30 | Fill #0

## 2015-09-07 MED FILL — predniSONE 20 MG TABS: 20 | 5 days supply | Qty: 5 | Fill #0

## 2015-09-07 NOTE — Patient Instructions (Addendum)
Diabetes and Foot Care Diabetes may cause you to have problems because of poor blood supply (circulation) to your feet and legs. This may cause the skin on your feet to become thinner, break easier, and heal more slowly. Your skin may become dry, and the skin may peel and crack. You may also have nerve damage in your legs and feet causing decreased feeling in them. You may not notice minor injuries to your feet that could lead to infections or more serious problems. Taking care of your feet is one of the most important things you can do for yourself.  HOME CARE INSTRUCTIONS  Wear shoes at all times, even in the house. Do not go barefoot. Bare feet are easily injured.  Check your feet daily for blisters, cuts, and redness. If you cannot see the bottom of your feet, use a mirror or ask someone for help.  Wash your feet with warm water (do not use hot water) and mild soap. Then pat your feet and the areas between your toes until they are completely dry. Do not soak your feet as this can dry your skin.  Apply a moisturizing lotion or petroleum jelly (that does not contain alcohol and is unscented) to the skin on your feet and to dry, brittle toenails. Do not apply lotion between your toes.  Trim your toenails straight across. Do not dig under them or around the cuticle. File the edges of your nails with an emery board or nail file.  Do not cut corns or calluses or try to remove them with medicine.  Wear clean socks or stockings every day. Make sure they are not too tight. Do not wear knee-high stockings since they may decrease blood flow to your legs.  Wear shoes that fit properly and have enough cushioning. To break in new shoes, wear them for just a few hours a day. This prevents you from injuring your feet. Always look in your shoes before you put them on to be sure there are no objects inside.  Do not cross your legs. This may decrease the blood flow to your feet.  If you find a minor scrape,  cut, or break in the skin on your feet, keep it and the skin around it clean and dry. These areas may be cleansed with mild soap and water. Do not cleanse the area with peroxide, alcohol, or iodine.  When you remove an adhesive bandage, be sure not to damage the skin around it.  If you have a wound, look at it several times a day to make sure it is healing.  Do not use heating pads or hot water bottles. They may burn your skin. If you have lost feeling in your feet or legs, you may not know it is happening until it is too late.  Make sure your health care provider performs a complete foot exam at least annually or more often if you have foot problems. Report any cuts, sores, or bruises to your health care provider immediately. SEEK MEDICAL CARE IF:   You have an injury that is not healing.  You have cuts or breaks in the skin.  You have an ingrown nail.  You notice redness on your legs or feet.  You feel burning or tingling in your legs or feet.  You have pain or cramps in your legs and feet.  Your legs or feet are numb.  Your feet always feel cold. SEEK IMMEDIATE MEDICAL CARE IF:   There is increasing redness,  swelling, or pain in or around a wound.  There is a red line that goes up your leg.  Pus is coming from a wound.  You develop a fever or as directed by your health care provider.  You notice a bad smell coming from an ulcer or wound.   This information is not intended to replace advice given to you by your health care provider. Make sure you discuss any questions you have with your health care provider.   Document Released: 08/17/2000 Document Revised: 04/22/2013 Document Reviewed: 01/27/2013 Elsevier Interactive Patient Education [employer].   Today your diabetic foot exam demonstrated absent pedal pulses suggestive of possible circulation problems. You describe a vascular exam the past 4 months without any follow-up I will see if I can find this in your  medical record The hammertoe on the second left will need a soft shoe such as your wearing to avoid pressure to the area Today I trim your toenails and will suggest return every 3 months

## 2015-09-07 NOTE — Progress Notes (Signed)
   Subjective:    Patient ID: [name], male    DOB: June 18, 1961, 55 y.o.   MRN: [id number]  HPI    Patient presents today complaining of a painful second left toe when wearing a tight or dress shoe over the toe. The symptoms are relieved when patient wears a soft shoe with a soft upper. Denies any infection in the area or any other professional care. Currently patient is wearing a very soft shoe with a soft cloth upper which causes no discomfort in the second left toe. Patient relates a history of amputation distal left hallux secondary to a metallic foreign body an infection. The amputation was performed at 2014. He said no further amputation. He denies any claudication when he walks. He says that he's had a recent vascular examination without any recommendations for follow-up He is a type II diabetic with end-stage renal disease on dialysis  patient also request toenail debridement today   Review of Systems  Respiratory: Positive for cough.   Cardiovascular: Positive for leg swelling.  Musculoskeletal: Positive for myalgias.       Objective:   Physical Exam   patient appears pleasant orientated 3  Vascular: No peripheral edema noted bilaterally DP and PT pulses 0/4 bilaterally Capillary reflex within normal limits bilaterally  Neurological: Sensation to 10 g monofilament wire intact 4/5 bilaterally Vibratory sensation nonreactive right reactive left Ankle reflex were equal and reactive bilaterally  Dermatological: No open skin lesions bilaterally Dry atrophic skin bilaterally without hair growth The toenails are elongated, incurvated with occasional texture and color changes  Musculoskeletal: Amputation distal left hallux with well-healed incision Rigid hammertoe second left without any signs of irritation on the second left toe There is no restriction ankle, subtalar, midtarsal joints   chart review could not find any vascular examination    Assessment  & Plan:    assessment: Decrease pedal pulses suggestive of possible peripheral arterial disease Diabetic peripheral neuropathy Hammertoe second left  Plan: Today review the results of the examination with patient today. I made him aware that I could not find any vascular examination in his medical record. As he has no open lesions and he can control the symptoms of the second left hammertoe I am recommending that he wears existing soft upper shoes  to accommodate the hammertoe second left   The toenails 6-10 were debrided mechanically and elected without any bleeding  Reappoint 3
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
## 2015-09-08 DIAGNOSIS — N2581 Secondary hyperparathyroidism of renal origin: Secondary | ICD-10-CM | POA: Diagnosis not present

## 2015-09-08 DIAGNOSIS — E1129 Type 2 diabetes mellitus with other diabetic kidney complication: Secondary | ICD-10-CM | POA: Diagnosis not present

## 2015-09-08 DIAGNOSIS — N186 End stage renal disease: Secondary | ICD-10-CM | POA: Diagnosis not present

## 2015-09-08 DIAGNOSIS — D631 Anemia in chronic kidney disease: Secondary | ICD-10-CM | POA: Diagnosis not present

## 2015-09-08 DIAGNOSIS — D509 Iron deficiency anemia, unspecified: Secondary | ICD-10-CM | POA: Diagnosis not present

## 2015-09-10 DIAGNOSIS — D631 Anemia in chronic kidney disease: Secondary | ICD-10-CM | POA: Diagnosis not present

## 2015-09-10 DIAGNOSIS — N2581 Secondary hyperparathyroidism of renal origin: Secondary | ICD-10-CM | POA: Diagnosis not present

## 2015-09-10 DIAGNOSIS — N186 End stage renal disease: Secondary | ICD-10-CM | POA: Diagnosis not present

## 2015-09-10 DIAGNOSIS — E1129 Type 2 diabetes mellitus with other diabetic kidney complication: Secondary | ICD-10-CM | POA: Diagnosis not present

## 2015-09-10 DIAGNOSIS — D509 Iron deficiency anemia, unspecified: Secondary | ICD-10-CM | POA: Diagnosis not present

## 2015-09-13 DIAGNOSIS — N186 End stage renal disease: Secondary | ICD-10-CM | POA: Diagnosis not present

## 2015-09-13 DIAGNOSIS — D509 Iron deficiency anemia, unspecified: Secondary | ICD-10-CM | POA: Diagnosis not present

## 2015-09-13 DIAGNOSIS — N2581 Secondary hyperparathyroidism of renal origin: Secondary | ICD-10-CM | POA: Diagnosis not present

## 2015-09-13 DIAGNOSIS — E1129 Type 2 diabetes mellitus with other diabetic kidney complication: Secondary | ICD-10-CM | POA: Diagnosis not present

## 2015-09-13 DIAGNOSIS — D631 Anemia in chronic kidney disease: Secondary | ICD-10-CM | POA: Diagnosis not present

## 2015-09-15 DIAGNOSIS — D509 Iron deficiency anemia, unspecified: Secondary | ICD-10-CM | POA: Diagnosis not present

## 2015-09-15 DIAGNOSIS — N2581 Secondary hyperparathyroidism of renal origin: Secondary | ICD-10-CM | POA: Diagnosis not present

## 2015-09-15 DIAGNOSIS — D631 Anemia in chronic kidney disease: Secondary | ICD-10-CM | POA: Diagnosis not present

## 2015-09-15 DIAGNOSIS — N186 End stage renal disease: Secondary | ICD-10-CM | POA: Diagnosis not present

## 2015-09-15 DIAGNOSIS — E1129 Type 2 diabetes mellitus with other diabetic kidney complication: Secondary | ICD-10-CM | POA: Diagnosis not present

## 2015-09-17 DIAGNOSIS — N186 End stage renal disease: Secondary | ICD-10-CM | POA: Diagnosis not present

## 2015-09-17 DIAGNOSIS — N2581 Secondary hyperparathyroidism of renal origin: Secondary | ICD-10-CM | POA: Diagnosis not present

## 2015-09-17 DIAGNOSIS — D509 Iron deficiency anemia, unspecified: Secondary | ICD-10-CM | POA: Diagnosis not present

## 2015-09-17 DIAGNOSIS — D631 Anemia in chronic kidney disease: Secondary | ICD-10-CM | POA: Diagnosis not present

## 2015-09-17 DIAGNOSIS — E1129 Type 2 diabetes mellitus with other diabetic kidney complication: Secondary | ICD-10-CM | POA: Diagnosis not present

## 2015-09-20 DIAGNOSIS — N2581 Secondary hyperparathyroidism of renal origin: Secondary | ICD-10-CM | POA: Diagnosis not present

## 2015-09-20 DIAGNOSIS — N186 End stage renal disease: Secondary | ICD-10-CM | POA: Diagnosis not present

## 2015-09-20 DIAGNOSIS — E1129 Type 2 diabetes mellitus with other diabetic kidney complication: Secondary | ICD-10-CM | POA: Diagnosis not present

## 2015-09-20 DIAGNOSIS — D509 Iron deficiency anemia, unspecified: Secondary | ICD-10-CM | POA: Diagnosis not present

## 2015-09-20 DIAGNOSIS — D631 Anemia in chronic kidney disease: Secondary | ICD-10-CM | POA: Diagnosis not present

## 2015-09-23 ENCOUNTER — Ambulatory Visit: Payer: Self-pay | Admitting: Family Medicine

## 2015-09-24 DIAGNOSIS — N186 End stage renal disease: Secondary | ICD-10-CM | POA: Diagnosis not present

## 2015-09-24 DIAGNOSIS — D509 Iron deficiency anemia, unspecified: Secondary | ICD-10-CM | POA: Diagnosis not present

## 2015-09-24 DIAGNOSIS — E1129 Type 2 diabetes mellitus with other diabetic kidney complication: Secondary | ICD-10-CM | POA: Diagnosis not present

## 2015-09-24 DIAGNOSIS — N2581 Secondary hyperparathyroidism of renal origin: Secondary | ICD-10-CM | POA: Diagnosis not present

## 2015-09-24 DIAGNOSIS — D631 Anemia in chronic kidney disease: Secondary | ICD-10-CM | POA: Diagnosis not present

## 2015-09-27 DIAGNOSIS — N186 End stage renal disease: Secondary | ICD-10-CM | POA: Diagnosis not present

## 2015-09-27 DIAGNOSIS — E1129 Type 2 diabetes mellitus with other diabetic kidney complication: Secondary | ICD-10-CM | POA: Diagnosis not present

## 2015-09-27 DIAGNOSIS — N2581 Secondary hyperparathyroidism of renal origin: Secondary | ICD-10-CM | POA: Diagnosis not present

## 2015-09-27 DIAGNOSIS — D631 Anemia in chronic kidney disease: Secondary | ICD-10-CM | POA: Diagnosis not present

## 2015-09-27 DIAGNOSIS — D509 Iron deficiency anemia, unspecified: Secondary | ICD-10-CM | POA: Diagnosis not present

## 2015-10-01 DIAGNOSIS — N2581 Secondary hyperparathyroidism of renal origin: Secondary | ICD-10-CM | POA: Diagnosis not present

## 2015-10-01 DIAGNOSIS — D509 Iron deficiency anemia, unspecified: Secondary | ICD-10-CM | POA: Diagnosis not present

## 2015-10-01 DIAGNOSIS — E1129 Type 2 diabetes mellitus with other diabetic kidney complication: Secondary | ICD-10-CM | POA: Diagnosis not present

## 2015-10-01 DIAGNOSIS — D631 Anemia in chronic kidney disease: Secondary | ICD-10-CM | POA: Diagnosis not present

## 2015-10-01 DIAGNOSIS — N186 End stage renal disease: Secondary | ICD-10-CM | POA: Diagnosis not present

## 2015-10-04 DIAGNOSIS — Z992 Dependence on renal dialysis: Secondary | ICD-10-CM | POA: Diagnosis not present

## 2015-10-04 DIAGNOSIS — D631 Anemia in chronic kidney disease: Secondary | ICD-10-CM | POA: Diagnosis not present

## 2015-10-04 DIAGNOSIS — D509 Iron deficiency anemia, unspecified: Secondary | ICD-10-CM | POA: Diagnosis not present

## 2015-10-04 DIAGNOSIS — N186 End stage renal disease: Secondary | ICD-10-CM | POA: Diagnosis not present

## 2015-10-04 DIAGNOSIS — E1122 Type 2 diabetes mellitus with diabetic chronic kidney disease: Secondary | ICD-10-CM | POA: Diagnosis not present

## 2015-10-04 DIAGNOSIS — N2581 Secondary hyperparathyroidism of renal origin: Secondary | ICD-10-CM | POA: Diagnosis not present

## 2015-10-04 DIAGNOSIS — E1129 Type 2 diabetes mellitus with other diabetic kidney complication: Secondary | ICD-10-CM | POA: Diagnosis not present

## 2015-10-06 DIAGNOSIS — D509 Iron deficiency anemia, unspecified: Secondary | ICD-10-CM | POA: Diagnosis not present

## 2015-10-06 DIAGNOSIS — N2581 Secondary hyperparathyroidism of renal origin: Secondary | ICD-10-CM | POA: Diagnosis not present

## 2015-10-06 DIAGNOSIS — D631 Anemia in chronic kidney disease: Secondary | ICD-10-CM | POA: Diagnosis not present

## 2015-10-06 DIAGNOSIS — E1129 Type 2 diabetes mellitus with other diabetic kidney complication: Secondary | ICD-10-CM | POA: Diagnosis not present

## 2015-10-06 DIAGNOSIS — N186 End stage renal disease: Secondary | ICD-10-CM | POA: Diagnosis not present

## 2015-10-08 DIAGNOSIS — N186 End stage renal disease: Secondary | ICD-10-CM | POA: Diagnosis not present

## 2015-10-08 DIAGNOSIS — N2581 Secondary hyperparathyroidism of renal origin: Secondary | ICD-10-CM | POA: Diagnosis not present

## 2015-10-08 DIAGNOSIS — D509 Iron deficiency anemia, unspecified: Secondary | ICD-10-CM | POA: Diagnosis not present

## 2015-10-08 DIAGNOSIS — D631 Anemia in chronic kidney disease: Secondary | ICD-10-CM | POA: Diagnosis not present

## 2015-10-08 DIAGNOSIS — E1129 Type 2 diabetes mellitus with other diabetic kidney complication: Secondary | ICD-10-CM | POA: Diagnosis not present

## 2015-10-11 DIAGNOSIS — E1129 Type 2 diabetes mellitus with other diabetic kidney complication: Secondary | ICD-10-CM | POA: Diagnosis not present

## 2015-10-11 DIAGNOSIS — N2581 Secondary hyperparathyroidism of renal origin: Secondary | ICD-10-CM | POA: Diagnosis not present

## 2015-10-11 DIAGNOSIS — N186 End stage renal disease: Secondary | ICD-10-CM | POA: Diagnosis not present

## 2015-10-11 DIAGNOSIS — D509 Iron deficiency anemia, unspecified: Secondary | ICD-10-CM | POA: Diagnosis not present

## 2015-10-11 DIAGNOSIS — D631 Anemia in chronic kidney disease: Secondary | ICD-10-CM | POA: Diagnosis not present

## 2015-10-13 DIAGNOSIS — D509 Iron deficiency anemia, unspecified: Secondary | ICD-10-CM | POA: Diagnosis not present

## 2015-10-13 DIAGNOSIS — D631 Anemia in chronic kidney disease: Secondary | ICD-10-CM | POA: Diagnosis not present

## 2015-10-13 DIAGNOSIS — N186 End stage renal disease: Secondary | ICD-10-CM | POA: Diagnosis not present

## 2015-10-13 DIAGNOSIS — N2581 Secondary hyperparathyroidism of renal origin: Secondary | ICD-10-CM | POA: Diagnosis not present

## 2015-10-13 DIAGNOSIS — E1129 Type 2 diabetes mellitus with other diabetic kidney complication: Secondary | ICD-10-CM | POA: Diagnosis not present

## 2015-10-15 DIAGNOSIS — D509 Iron deficiency anemia, unspecified: Secondary | ICD-10-CM | POA: Diagnosis not present

## 2015-10-15 DIAGNOSIS — E1129 Type 2 diabetes mellitus with other diabetic kidney complication: Secondary | ICD-10-CM | POA: Diagnosis not present

## 2015-10-15 DIAGNOSIS — N2581 Secondary hyperparathyroidism of renal origin: Secondary | ICD-10-CM | POA: Diagnosis not present

## 2015-10-15 DIAGNOSIS — N186 End stage renal disease: Secondary | ICD-10-CM | POA: Diagnosis not present

## 2015-10-15 DIAGNOSIS — D631 Anemia in chronic kidney disease: Secondary | ICD-10-CM | POA: Diagnosis not present

## 2015-10-18 DIAGNOSIS — N2581 Secondary hyperparathyroidism of renal origin: Secondary | ICD-10-CM | POA: Diagnosis not present

## 2015-10-18 DIAGNOSIS — N186 End stage renal disease: Secondary | ICD-10-CM | POA: Diagnosis not present

## 2015-10-18 DIAGNOSIS — D509 Iron deficiency anemia, unspecified: Secondary | ICD-10-CM | POA: Diagnosis not present

## 2015-10-18 DIAGNOSIS — E1129 Type 2 diabetes mellitus with other diabetic kidney complication: Secondary | ICD-10-CM | POA: Diagnosis not present

## 2015-10-18 DIAGNOSIS — D631 Anemia in chronic kidney disease: Secondary | ICD-10-CM | POA: Diagnosis not present

## 2015-10-20 DIAGNOSIS — N186 End stage renal disease: Secondary | ICD-10-CM | POA: Diagnosis not present

## 2015-10-20 DIAGNOSIS — N2581 Secondary hyperparathyroidism of renal origin: Secondary | ICD-10-CM | POA: Diagnosis not present

## 2015-10-20 DIAGNOSIS — D509 Iron deficiency anemia, unspecified: Secondary | ICD-10-CM | POA: Diagnosis not present

## 2015-10-20 DIAGNOSIS — E1129 Type 2 diabetes mellitus with other diabetic kidney complication: Secondary | ICD-10-CM | POA: Diagnosis not present

## 2015-10-20 DIAGNOSIS — D631 Anemia in chronic kidney disease: Secondary | ICD-10-CM | POA: Diagnosis not present

## 2015-10-22 DIAGNOSIS — N186 End stage renal disease: Secondary | ICD-10-CM | POA: Diagnosis not present

## 2015-10-22 DIAGNOSIS — D509 Iron deficiency anemia, unspecified: Secondary | ICD-10-CM | POA: Diagnosis not present

## 2015-10-22 DIAGNOSIS — N2581 Secondary hyperparathyroidism of renal origin: Secondary | ICD-10-CM | POA: Diagnosis not present

## 2015-10-22 DIAGNOSIS — E1129 Type 2 diabetes mellitus with other diabetic kidney complication: Secondary | ICD-10-CM | POA: Diagnosis not present

## 2015-10-22 DIAGNOSIS — D631 Anemia in chronic kidney disease: Secondary | ICD-10-CM | POA: Diagnosis not present

## 2015-10-25 DIAGNOSIS — N2581 Secondary hyperparathyroidism of renal origin: Secondary | ICD-10-CM | POA: Diagnosis not present

## 2015-10-25 DIAGNOSIS — N186 End stage renal disease: Secondary | ICD-10-CM | POA: Diagnosis not present

## 2015-10-25 DIAGNOSIS — D631 Anemia in chronic kidney disease: Secondary | ICD-10-CM | POA: Diagnosis not present

## 2015-10-25 DIAGNOSIS — E1129 Type 2 diabetes mellitus with other diabetic kidney complication: Secondary | ICD-10-CM | POA: Diagnosis not present

## 2015-10-25 DIAGNOSIS — D509 Iron deficiency anemia, unspecified: Secondary | ICD-10-CM | POA: Diagnosis not present

## 2015-10-27 DIAGNOSIS — E1129 Type 2 diabetes mellitus with other diabetic kidney complication: Secondary | ICD-10-CM | POA: Diagnosis not present

## 2015-10-27 DIAGNOSIS — N2581 Secondary hyperparathyroidism of renal origin: Secondary | ICD-10-CM | POA: Diagnosis not present

## 2015-10-27 DIAGNOSIS — D509 Iron deficiency anemia, unspecified: Secondary | ICD-10-CM | POA: Diagnosis not present

## 2015-10-27 DIAGNOSIS — N186 End stage renal disease: Secondary | ICD-10-CM | POA: Diagnosis not present

## 2015-10-27 DIAGNOSIS — D631 Anemia in chronic kidney disease: Secondary | ICD-10-CM | POA: Diagnosis not present

## 2015-10-28 ENCOUNTER — Ambulatory Visit: Payer: Self-pay | Admitting: Family Medicine

## 2015-10-29 DIAGNOSIS — D631 Anemia in chronic kidney disease: Secondary | ICD-10-CM | POA: Diagnosis not present

## 2015-10-29 DIAGNOSIS — E1129 Type 2 diabetes mellitus with other diabetic kidney complication: Secondary | ICD-10-CM | POA: Diagnosis not present

## 2015-10-29 DIAGNOSIS — N2581 Secondary hyperparathyroidism of renal origin: Secondary | ICD-10-CM | POA: Diagnosis not present

## 2015-10-29 DIAGNOSIS — D509 Iron deficiency anemia, unspecified: Secondary | ICD-10-CM | POA: Diagnosis not present

## 2015-10-29 DIAGNOSIS — N186 End stage renal disease: Secondary | ICD-10-CM | POA: Diagnosis not present

## 2015-11-01 DIAGNOSIS — N186 End stage renal disease: Secondary | ICD-10-CM | POA: Diagnosis not present

## 2015-11-01 DIAGNOSIS — E1129 Type 2 diabetes mellitus with other diabetic kidney complication: Secondary | ICD-10-CM | POA: Diagnosis not present

## 2015-11-01 DIAGNOSIS — D631 Anemia in chronic kidney disease: Secondary | ICD-10-CM | POA: Diagnosis not present

## 2015-11-01 DIAGNOSIS — Z992 Dependence on renal dialysis: Secondary | ICD-10-CM | POA: Diagnosis not present

## 2015-11-01 DIAGNOSIS — E1122 Type 2 diabetes mellitus with diabetic chronic kidney disease: Secondary | ICD-10-CM | POA: Diagnosis not present

## 2015-11-01 DIAGNOSIS — N2581 Secondary hyperparathyroidism of renal origin: Secondary | ICD-10-CM | POA: Diagnosis not present

## 2015-11-01 DIAGNOSIS — D509 Iron deficiency anemia, unspecified: Secondary | ICD-10-CM | POA: Diagnosis not present

## 2015-11-03 DIAGNOSIS — Z23 Encounter for immunization: Secondary | ICD-10-CM | POA: Diagnosis not present

## 2015-11-03 DIAGNOSIS — N2581 Secondary hyperparathyroidism of renal origin: Secondary | ICD-10-CM | POA: Diagnosis not present

## 2015-11-03 DIAGNOSIS — N186 End stage renal disease: Secondary | ICD-10-CM | POA: Diagnosis not present

## 2015-11-03 DIAGNOSIS — E1129 Type 2 diabetes mellitus with other diabetic kidney complication: Secondary | ICD-10-CM | POA: Diagnosis not present

## 2015-11-03 DIAGNOSIS — D509 Iron deficiency anemia, unspecified: Secondary | ICD-10-CM | POA: Diagnosis not present

## 2015-11-05 DIAGNOSIS — N2581 Secondary hyperparathyroidism of renal origin: Secondary | ICD-10-CM | POA: Diagnosis not present

## 2015-11-05 DIAGNOSIS — E1129 Type 2 diabetes mellitus with other diabetic kidney complication: Secondary | ICD-10-CM | POA: Diagnosis not present

## 2015-11-05 DIAGNOSIS — D509 Iron deficiency anemia, unspecified: Secondary | ICD-10-CM | POA: Diagnosis not present

## 2015-11-05 DIAGNOSIS — Z23 Encounter for immunization: Secondary | ICD-10-CM | POA: Diagnosis not present

## 2015-11-05 DIAGNOSIS — N186 End stage renal disease: Secondary | ICD-10-CM | POA: Diagnosis not present

## 2015-11-08 DIAGNOSIS — N186 End stage renal disease: Secondary | ICD-10-CM | POA: Diagnosis not present

## 2015-11-08 DIAGNOSIS — E1129 Type 2 diabetes mellitus with other diabetic kidney complication: Secondary | ICD-10-CM | POA: Diagnosis not present

## 2015-11-08 DIAGNOSIS — Z23 Encounter for immunization: Secondary | ICD-10-CM | POA: Diagnosis not present

## 2015-11-08 DIAGNOSIS — N2581 Secondary hyperparathyroidism of renal origin: Secondary | ICD-10-CM | POA: Diagnosis not present

## 2015-11-08 DIAGNOSIS — D509 Iron deficiency anemia, unspecified: Secondary | ICD-10-CM | POA: Diagnosis not present

## 2015-11-10 DIAGNOSIS — E1129 Type 2 diabetes mellitus with other diabetic kidney complication: Secondary | ICD-10-CM | POA: Diagnosis not present

## 2015-11-10 DIAGNOSIS — N2581 Secondary hyperparathyroidism of renal origin: Secondary | ICD-10-CM | POA: Diagnosis not present

## 2015-11-10 DIAGNOSIS — N186 End stage renal disease: Secondary | ICD-10-CM | POA: Diagnosis not present

## 2015-11-10 DIAGNOSIS — D509 Iron deficiency anemia, unspecified: Secondary | ICD-10-CM | POA: Diagnosis not present

## 2015-11-10 DIAGNOSIS — Z23 Encounter for immunization: Secondary | ICD-10-CM | POA: Diagnosis not present

## 2015-11-12 DIAGNOSIS — Z23 Encounter for immunization: Secondary | ICD-10-CM | POA: Diagnosis not present

## 2015-11-12 DIAGNOSIS — D509 Iron deficiency anemia, unspecified: Secondary | ICD-10-CM | POA: Diagnosis not present

## 2015-11-12 DIAGNOSIS — N2581 Secondary hyperparathyroidism of renal origin: Secondary | ICD-10-CM | POA: Diagnosis not present

## 2015-11-12 DIAGNOSIS — E1129 Type 2 diabetes mellitus with other diabetic kidney complication: Secondary | ICD-10-CM | POA: Diagnosis not present

## 2015-11-12 DIAGNOSIS — N186 End stage renal disease: Secondary | ICD-10-CM | POA: Diagnosis not present

## 2015-11-15 DIAGNOSIS — D509 Iron deficiency anemia, unspecified: Secondary | ICD-10-CM | POA: Diagnosis not present

## 2015-11-15 DIAGNOSIS — E1129 Type 2 diabetes mellitus with other diabetic kidney complication: Secondary | ICD-10-CM | POA: Diagnosis not present

## 2015-11-15 DIAGNOSIS — Z23 Encounter for immunization: Secondary | ICD-10-CM | POA: Diagnosis not present

## 2015-11-15 DIAGNOSIS — N2581 Secondary hyperparathyroidism of renal origin: Secondary | ICD-10-CM | POA: Diagnosis not present

## 2015-11-15 DIAGNOSIS — N186 End stage renal disease: Secondary | ICD-10-CM | POA: Diagnosis not present

## 2015-11-17 DIAGNOSIS — N186 End stage renal disease: Secondary | ICD-10-CM | POA: Diagnosis not present

## 2015-11-17 DIAGNOSIS — Z23 Encounter for immunization: Secondary | ICD-10-CM | POA: Diagnosis not present

## 2015-11-17 DIAGNOSIS — N2581 Secondary hyperparathyroidism of renal origin: Secondary | ICD-10-CM | POA: Diagnosis not present

## 2015-11-17 DIAGNOSIS — E1129 Type 2 diabetes mellitus with other diabetic kidney complication: Secondary | ICD-10-CM | POA: Diagnosis not present

## 2015-11-17 DIAGNOSIS — D509 Iron deficiency anemia, unspecified: Secondary | ICD-10-CM | POA: Diagnosis not present

## 2015-11-18 DIAGNOSIS — I871 Compression of vein: Secondary | ICD-10-CM | POA: Diagnosis not present

## 2015-11-18 DIAGNOSIS — Z992 Dependence on renal dialysis: Secondary | ICD-10-CM | POA: Diagnosis not present

## 2015-11-18 DIAGNOSIS — T82868D Thrombosis of vascular prosthetic devices, implants and grafts, subsequent encounter: Secondary | ICD-10-CM | POA: Diagnosis not present

## 2015-11-18 DIAGNOSIS — N186 End stage renal disease: Secondary | ICD-10-CM | POA: Diagnosis not present

## 2015-11-19 DIAGNOSIS — N186 End stage renal disease: Secondary | ICD-10-CM | POA: Diagnosis not present

## 2015-11-19 DIAGNOSIS — N2581 Secondary hyperparathyroidism of renal origin: Secondary | ICD-10-CM | POA: Diagnosis not present

## 2015-11-19 DIAGNOSIS — Z23 Encounter for immunization: Secondary | ICD-10-CM | POA: Diagnosis not present

## 2015-11-19 DIAGNOSIS — D509 Iron deficiency anemia, unspecified: Secondary | ICD-10-CM | POA: Diagnosis not present

## 2015-11-19 DIAGNOSIS — E1129 Type 2 diabetes mellitus with other diabetic kidney complication: Secondary | ICD-10-CM | POA: Diagnosis not present

## 2015-11-22 DIAGNOSIS — E1129 Type 2 diabetes mellitus with other diabetic kidney complication: Secondary | ICD-10-CM | POA: Diagnosis not present

## 2015-11-22 DIAGNOSIS — N186 End stage renal disease: Secondary | ICD-10-CM | POA: Diagnosis not present

## 2015-11-22 DIAGNOSIS — D509 Iron deficiency anemia, unspecified: Secondary | ICD-10-CM | POA: Diagnosis not present

## 2015-11-22 DIAGNOSIS — N2581 Secondary hyperparathyroidism of renal origin: Secondary | ICD-10-CM | POA: Diagnosis not present

## 2015-11-22 DIAGNOSIS — Z23 Encounter for immunization: Secondary | ICD-10-CM | POA: Diagnosis not present

## 2015-11-24 DIAGNOSIS — N2581 Secondary hyperparathyroidism of renal origin: Secondary | ICD-10-CM | POA: Diagnosis not present

## 2015-11-24 DIAGNOSIS — D509 Iron deficiency anemia, unspecified: Secondary | ICD-10-CM | POA: Diagnosis not present

## 2015-11-24 DIAGNOSIS — N186 End stage renal disease: Secondary | ICD-10-CM | POA: Diagnosis not present

## 2015-11-24 DIAGNOSIS — Z23 Encounter for immunization: Secondary | ICD-10-CM | POA: Diagnosis not present

## 2015-11-24 DIAGNOSIS — E1129 Type 2 diabetes mellitus with other diabetic kidney complication: Secondary | ICD-10-CM | POA: Diagnosis not present

## 2015-11-26 DIAGNOSIS — N186 End stage renal disease: Secondary | ICD-10-CM | POA: Diagnosis not present

## 2015-11-26 DIAGNOSIS — N2581 Secondary hyperparathyroidism of renal origin: Secondary | ICD-10-CM | POA: Diagnosis not present

## 2015-11-26 DIAGNOSIS — E1129 Type 2 diabetes mellitus with other diabetic kidney complication: Secondary | ICD-10-CM | POA: Diagnosis not present

## 2015-11-26 DIAGNOSIS — D509 Iron deficiency anemia, unspecified: Secondary | ICD-10-CM | POA: Diagnosis not present

## 2015-11-26 DIAGNOSIS — Z23 Encounter for immunization: Secondary | ICD-10-CM | POA: Diagnosis not present

## 2015-11-29 DIAGNOSIS — N2581 Secondary hyperparathyroidism of renal origin: Secondary | ICD-10-CM | POA: Diagnosis not present

## 2015-11-29 DIAGNOSIS — D509 Iron deficiency anemia, unspecified: Secondary | ICD-10-CM | POA: Diagnosis not present

## 2015-11-29 DIAGNOSIS — N186 End stage renal disease: Secondary | ICD-10-CM | POA: Diagnosis not present

## 2015-11-29 DIAGNOSIS — Z23 Encounter for immunization: Secondary | ICD-10-CM | POA: Diagnosis not present

## 2015-11-29 DIAGNOSIS — E1129 Type 2 diabetes mellitus with other diabetic kidney complication: Secondary | ICD-10-CM | POA: Diagnosis not present

## 2015-12-01 DIAGNOSIS — N2581 Secondary hyperparathyroidism of renal origin: Secondary | ICD-10-CM | POA: Diagnosis not present

## 2015-12-01 DIAGNOSIS — N186 End stage renal disease: Secondary | ICD-10-CM | POA: Diagnosis not present

## 2015-12-01 DIAGNOSIS — D509 Iron deficiency anemia, unspecified: Secondary | ICD-10-CM | POA: Diagnosis not present

## 2015-12-01 DIAGNOSIS — E1129 Type 2 diabetes mellitus with other diabetic kidney complication: Secondary | ICD-10-CM | POA: Diagnosis not present

## 2015-12-01 DIAGNOSIS — Z23 Encounter for immunization: Secondary | ICD-10-CM | POA: Diagnosis not present

## 2015-12-02 DIAGNOSIS — Z992 Dependence on renal dialysis: Secondary | ICD-10-CM | POA: Diagnosis not present

## 2015-12-02 DIAGNOSIS — E1122 Type 2 diabetes mellitus with diabetic chronic kidney disease: Secondary | ICD-10-CM | POA: Diagnosis not present

## 2015-12-02 DIAGNOSIS — N186 End stage renal disease: Secondary | ICD-10-CM | POA: Diagnosis not present

## 2015-12-03 DIAGNOSIS — D631 Anemia in chronic kidney disease: Secondary | ICD-10-CM | POA: Diagnosis not present

## 2015-12-03 DIAGNOSIS — N2581 Secondary hyperparathyroidism of renal origin: Secondary | ICD-10-CM | POA: Diagnosis not present

## 2015-12-03 DIAGNOSIS — Z23 Encounter for immunization: Secondary | ICD-10-CM | POA: Diagnosis not present

## 2015-12-03 DIAGNOSIS — D509 Iron deficiency anemia, unspecified: Secondary | ICD-10-CM | POA: Diagnosis not present

## 2015-12-03 DIAGNOSIS — E1129 Type 2 diabetes mellitus with other diabetic kidney complication: Secondary | ICD-10-CM | POA: Diagnosis not present

## 2015-12-03 DIAGNOSIS — N186 End stage renal disease: Secondary | ICD-10-CM | POA: Diagnosis not present

## 2015-12-06 ENCOUNTER — Ambulatory Visit: Payer: Medicare Other | Admitting: Podiatry

## 2015-12-06 DIAGNOSIS — E1129 Type 2 diabetes mellitus with other diabetic kidney complication: Secondary | ICD-10-CM | POA: Diagnosis not present

## 2015-12-06 DIAGNOSIS — N186 End stage renal disease: Secondary | ICD-10-CM | POA: Diagnosis not present

## 2015-12-06 DIAGNOSIS — D509 Iron deficiency anemia, unspecified: Secondary | ICD-10-CM | POA: Diagnosis not present

## 2015-12-06 DIAGNOSIS — N2581 Secondary hyperparathyroidism of renal origin: Secondary | ICD-10-CM | POA: Diagnosis not present

## 2015-12-06 DIAGNOSIS — D631 Anemia in chronic kidney disease: Secondary | ICD-10-CM | POA: Diagnosis not present

## 2015-12-06 DIAGNOSIS — Z23 Encounter for immunization: Secondary | ICD-10-CM | POA: Diagnosis not present

## 2015-12-06 IMAGING — CR DG CHEST 2V
2 series · 2 of 2 positions shown · non-contrast
Comparison: 09/14/2014

CLINICAL DATA: Cough for 3 days, worse today.

EXAM:
CHEST  2 VIEW

[chest pa]
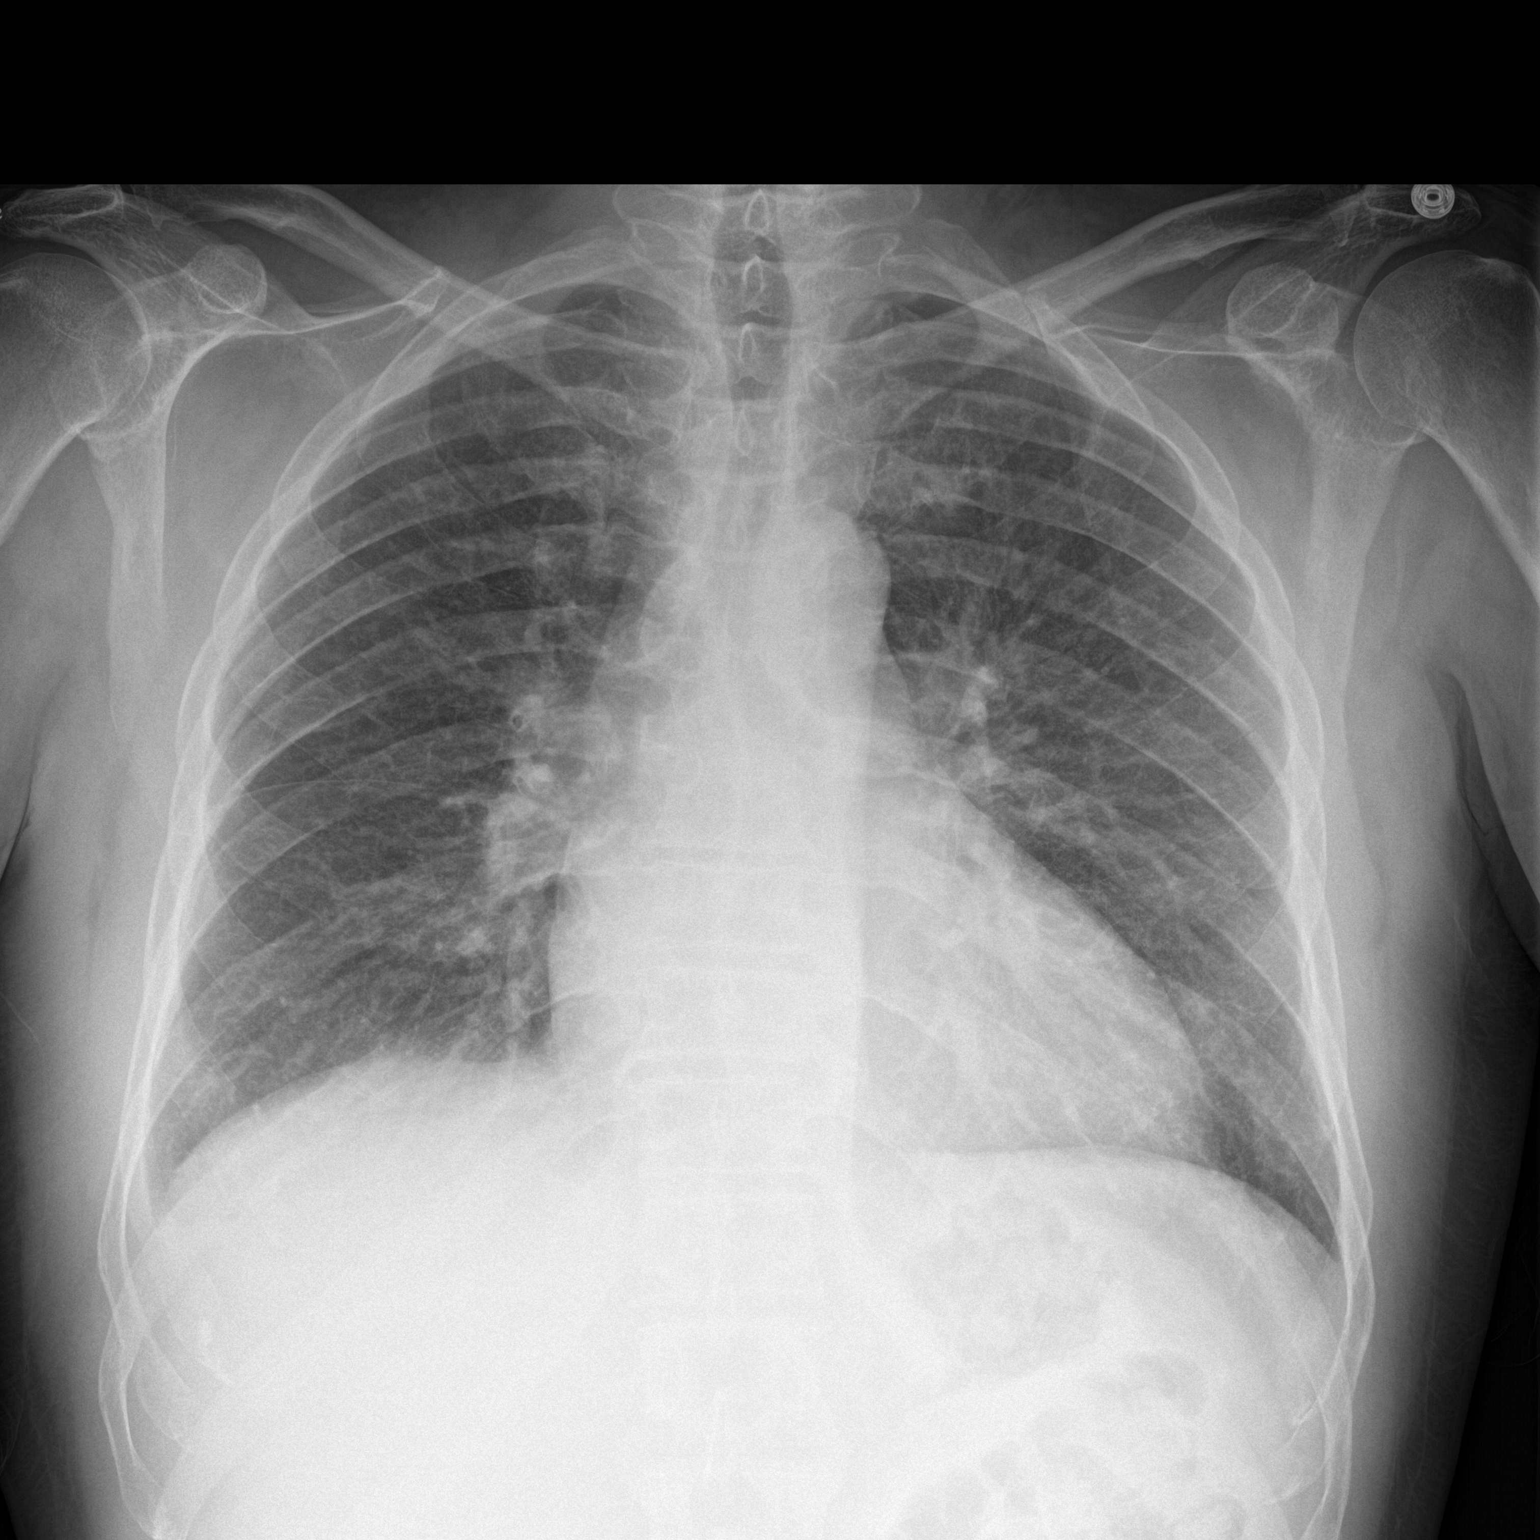

[chest lat]
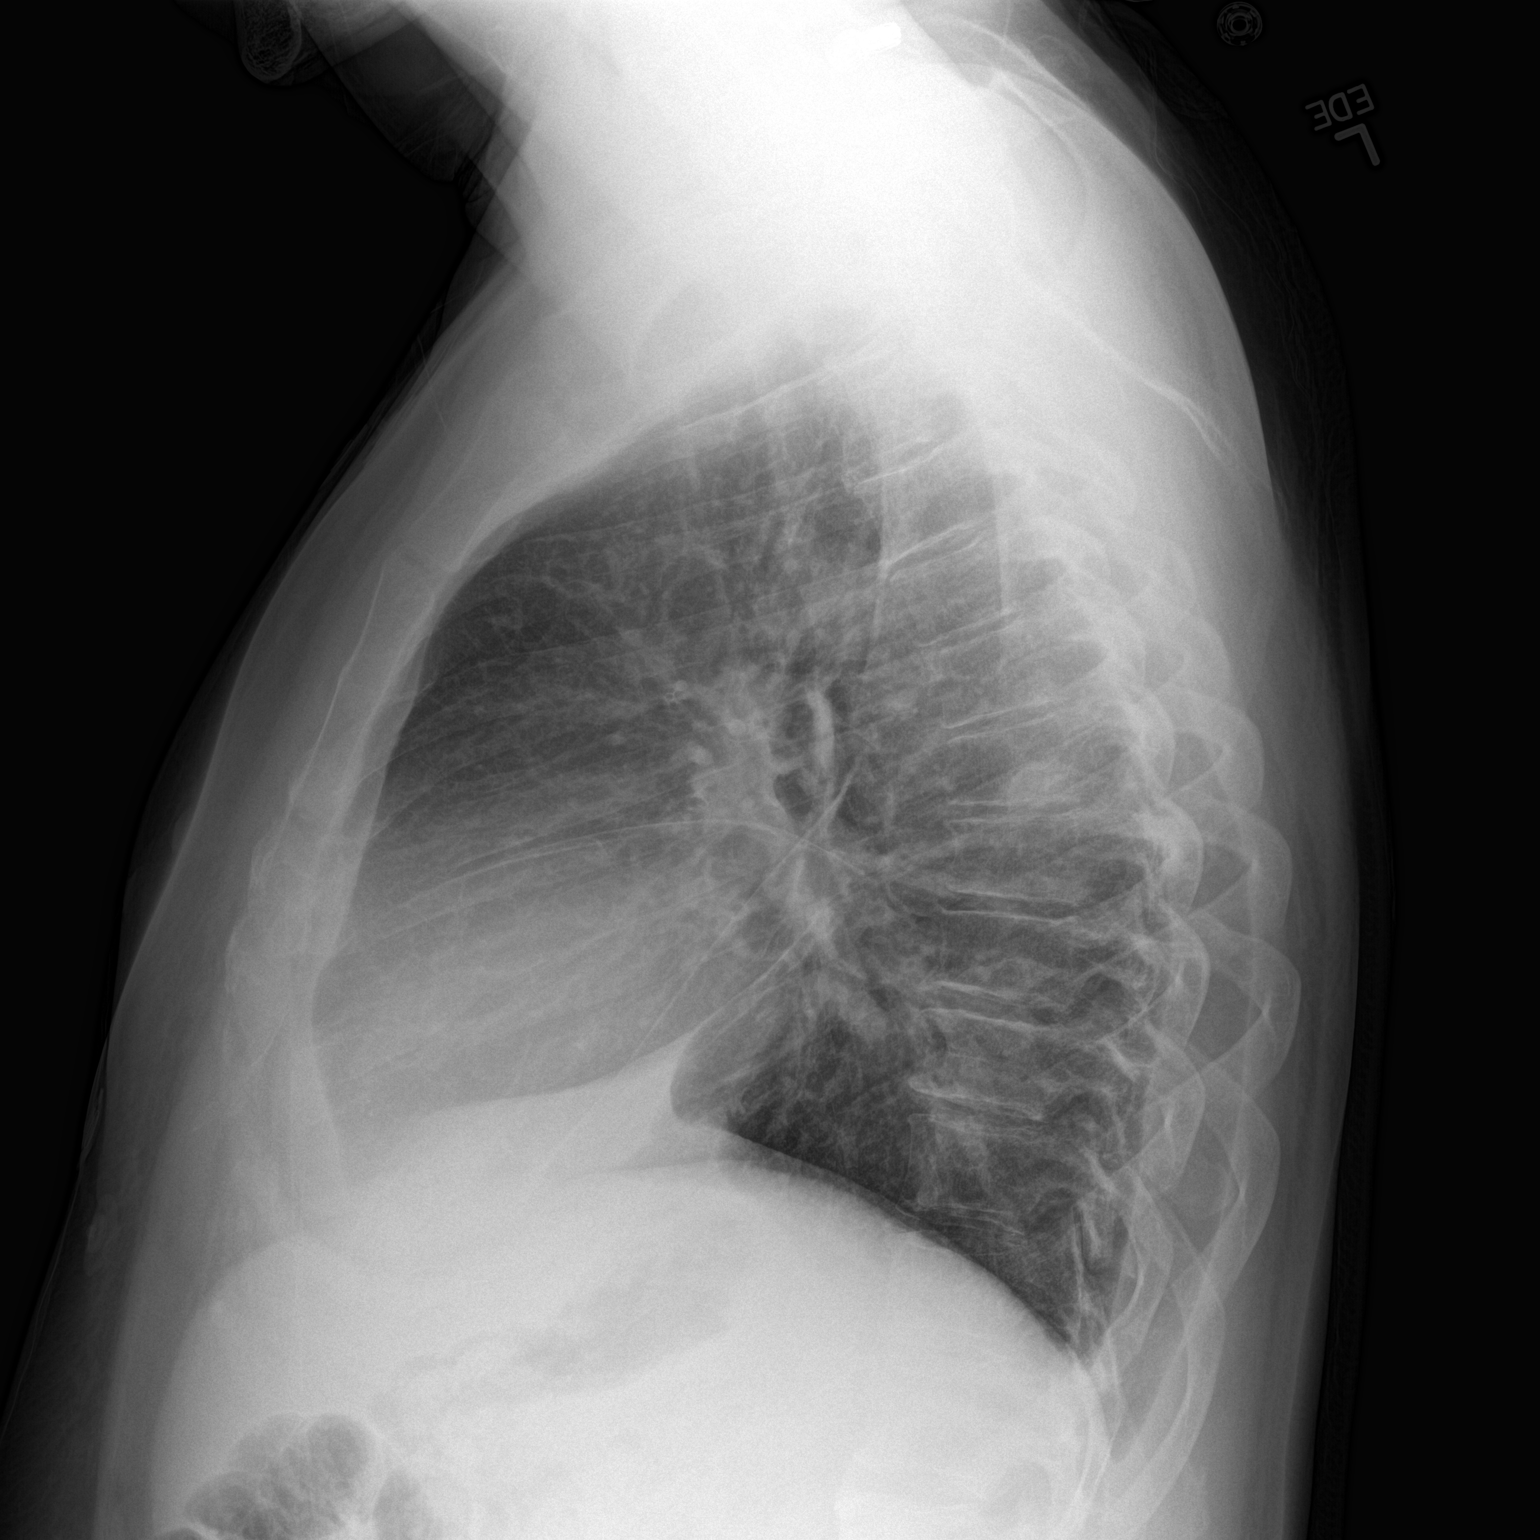

[2 of 2 positions shown; findings below may reference images not displayed]

FINDINGS: The heart size and mediastinal contours are within normal limits.
Both lungs are clear. The visualized skeletal structures are
unremarkable.
IMPRESSION: No active cardiopulmonary disease.

## 2015-12-08 DIAGNOSIS — Z23 Encounter for immunization: Secondary | ICD-10-CM | POA: Diagnosis not present

## 2015-12-08 DIAGNOSIS — N186 End stage renal disease: Secondary | ICD-10-CM | POA: Diagnosis not present

## 2015-12-08 DIAGNOSIS — D509 Iron deficiency anemia, unspecified: Secondary | ICD-10-CM | POA: Diagnosis not present

## 2015-12-08 DIAGNOSIS — N2581 Secondary hyperparathyroidism of renal origin: Secondary | ICD-10-CM | POA: Diagnosis not present

## 2015-12-08 DIAGNOSIS — D631 Anemia in chronic kidney disease: Secondary | ICD-10-CM | POA: Diagnosis not present

## 2015-12-08 DIAGNOSIS — E1129 Type 2 diabetes mellitus with other diabetic kidney complication: Secondary | ICD-10-CM | POA: Diagnosis not present

## 2015-12-10 DIAGNOSIS — Z23 Encounter for immunization: Secondary | ICD-10-CM | POA: Diagnosis not present

## 2015-12-10 DIAGNOSIS — N2581 Secondary hyperparathyroidism of renal origin: Secondary | ICD-10-CM | POA: Diagnosis not present

## 2015-12-10 DIAGNOSIS — D631 Anemia in chronic kidney disease: Secondary | ICD-10-CM | POA: Diagnosis not present

## 2015-12-10 DIAGNOSIS — E1129 Type 2 diabetes mellitus with other diabetic kidney complication: Secondary | ICD-10-CM | POA: Diagnosis not present

## 2015-12-10 DIAGNOSIS — N186 End stage renal disease: Secondary | ICD-10-CM | POA: Diagnosis not present

## 2015-12-10 DIAGNOSIS — D509 Iron deficiency anemia, unspecified: Secondary | ICD-10-CM | POA: Diagnosis not present

## 2015-12-13 DIAGNOSIS — Z23 Encounter for immunization: Secondary | ICD-10-CM | POA: Diagnosis not present

## 2015-12-13 DIAGNOSIS — E1129 Type 2 diabetes mellitus with other diabetic kidney complication: Secondary | ICD-10-CM | POA: Diagnosis not present

## 2015-12-13 DIAGNOSIS — N186 End stage renal disease: Secondary | ICD-10-CM | POA: Diagnosis not present

## 2015-12-13 DIAGNOSIS — N2581 Secondary hyperparathyroidism of renal origin: Secondary | ICD-10-CM | POA: Diagnosis not present

## 2015-12-13 DIAGNOSIS — D631 Anemia in chronic kidney disease: Secondary | ICD-10-CM | POA: Diagnosis not present

## 2015-12-13 DIAGNOSIS — D509 Iron deficiency anemia, unspecified: Secondary | ICD-10-CM | POA: Diagnosis not present

## 2015-12-15 DIAGNOSIS — N186 End stage renal disease: Secondary | ICD-10-CM | POA: Diagnosis not present

## 2015-12-15 DIAGNOSIS — E1129 Type 2 diabetes mellitus with other diabetic kidney complication: Secondary | ICD-10-CM | POA: Diagnosis not present

## 2015-12-15 DIAGNOSIS — D509 Iron deficiency anemia, unspecified: Secondary | ICD-10-CM | POA: Diagnosis not present

## 2015-12-15 DIAGNOSIS — Z23 Encounter for immunization: Secondary | ICD-10-CM | POA: Diagnosis not present

## 2015-12-15 DIAGNOSIS — N2581 Secondary hyperparathyroidism of renal origin: Secondary | ICD-10-CM | POA: Diagnosis not present

## 2015-12-15 DIAGNOSIS — D631 Anemia in chronic kidney disease: Secondary | ICD-10-CM | POA: Diagnosis not present

## 2015-12-17 DIAGNOSIS — Z23 Encounter for immunization: Secondary | ICD-10-CM | POA: Diagnosis not present

## 2015-12-17 DIAGNOSIS — D509 Iron deficiency anemia, unspecified: Secondary | ICD-10-CM | POA: Diagnosis not present

## 2015-12-17 DIAGNOSIS — N186 End stage renal disease: Secondary | ICD-10-CM | POA: Diagnosis not present

## 2015-12-17 DIAGNOSIS — N2581 Secondary hyperparathyroidism of renal origin: Secondary | ICD-10-CM | POA: Diagnosis not present

## 2015-12-17 DIAGNOSIS — E1129 Type 2 diabetes mellitus with other diabetic kidney complication: Secondary | ICD-10-CM | POA: Diagnosis not present

## 2015-12-17 DIAGNOSIS — D631 Anemia in chronic kidney disease: Secondary | ICD-10-CM | POA: Diagnosis not present

## 2015-12-20 DIAGNOSIS — N2581 Secondary hyperparathyroidism of renal origin: Secondary | ICD-10-CM | POA: Diagnosis not present

## 2015-12-20 DIAGNOSIS — N186 End stage renal disease: Secondary | ICD-10-CM | POA: Diagnosis not present

## 2015-12-20 DIAGNOSIS — D509 Iron deficiency anemia, unspecified: Secondary | ICD-10-CM | POA: Diagnosis not present

## 2015-12-20 DIAGNOSIS — E1129 Type 2 diabetes mellitus with other diabetic kidney complication: Secondary | ICD-10-CM | POA: Diagnosis not present

## 2015-12-20 DIAGNOSIS — D631 Anemia in chronic kidney disease: Secondary | ICD-10-CM | POA: Diagnosis not present

## 2015-12-20 DIAGNOSIS — Z23 Encounter for immunization: Secondary | ICD-10-CM | POA: Diagnosis not present

## 2015-12-21 ENCOUNTER — Ambulatory Visit (INDEPENDENT_AMBULATORY_CARE_PROVIDER_SITE_OTHER): Payer: Medicare Other | Admitting: Podiatry

## 2015-12-21 ENCOUNTER — Encounter: Payer: Self-pay | Admitting: Internal Medicine

## 2015-12-21 ENCOUNTER — Ambulatory Visit: Payer: Medicare Other | Attending: Internal Medicine | Admitting: Internal Medicine

## 2015-12-21 ENCOUNTER — Other Ambulatory Visit: Payer: Self-pay

## 2015-12-21 ENCOUNTER — Encounter: Payer: Self-pay | Admitting: Podiatry

## 2015-12-21 VITALS — BP 133/84 | HR 94 | Temp 97.6°F | Ht 71.0 in | Wt 206.0 lb

## 2015-12-21 DIAGNOSIS — I509 Heart failure, unspecified: Secondary | ICD-10-CM | POA: Insufficient documentation

## 2015-12-21 DIAGNOSIS — Z794 Long term (current) use of insulin: Secondary | ICD-10-CM

## 2015-12-21 DIAGNOSIS — L609 Nail disorder, unspecified: Secondary | ICD-10-CM

## 2015-12-21 DIAGNOSIS — I739 Peripheral vascular disease, unspecified: Secondary | ICD-10-CM | POA: Insufficient documentation

## 2015-12-21 DIAGNOSIS — N189 Chronic kidney disease, unspecified: Secondary | ICD-10-CM | POA: Insufficient documentation

## 2015-12-21 DIAGNOSIS — R079 Chest pain, unspecified: Secondary | ICD-10-CM | POA: Insufficient documentation

## 2015-12-21 DIAGNOSIS — K219 Gastro-esophageal reflux disease without esophagitis: Secondary | ICD-10-CM | POA: Insufficient documentation

## 2015-12-21 DIAGNOSIS — E1129 Type 2 diabetes mellitus with other diabetic kidney complication: Secondary | ICD-10-CM

## 2015-12-21 DIAGNOSIS — I10 Essential (primary) hypertension: Secondary | ICD-10-CM | POA: Insufficient documentation

## 2015-12-21 DIAGNOSIS — Z9889 Other specified postprocedural states: Secondary | ICD-10-CM | POA: Diagnosis not present

## 2015-12-21 DIAGNOSIS — E119 Type 2 diabetes mellitus without complications: Secondary | ICD-10-CM | POA: Insufficient documentation

## 2015-12-21 DIAGNOSIS — Z79899 Other long term (current) drug therapy: Secondary | ICD-10-CM | POA: Insufficient documentation

## 2015-12-21 DIAGNOSIS — I129 Hypertensive chronic kidney disease with stage 1 through stage 4 chronic kidney disease, or unspecified chronic kidney disease: Secondary | ICD-10-CM | POA: Insufficient documentation

## 2015-12-21 DIAGNOSIS — L608 Other nail disorders: Secondary | ICD-10-CM

## 2015-12-21 DIAGNOSIS — E1151 Type 2 diabetes mellitus with diabetic peripheral angiopathy without gangrene: Secondary | ICD-10-CM

## 2015-12-21 DIAGNOSIS — E1122 Type 2 diabetes mellitus with diabetic chronic kidney disease: Secondary | ICD-10-CM | POA: Diagnosis not present

## 2015-12-21 LAB — GLUCOSE, POCT (MANUAL RESULT ENTRY): POC Glucose: 235 mg/dl — AB (ref 70–99)

## 2015-12-21 LAB — POCT GLYCOSYLATED HEMOGLOBIN (HGB A1C): HEMOGLOBIN A1C: 9.5

## 2015-12-21 NOTE — Assessment & Plan Note (Signed)
New onset chest pain- sounds mechanical and pain is reproduced with palpation Will check EKG- if unchanged i don't think any further eval is necessary at this time

## 2015-12-21 NOTE — Patient Instructions (Signed)
The chest pain is likely related to some chest wall inflammation. Let us know if the pain worsens or doesn't resolve over the next week

## 2015-12-21 NOTE — Patient Instructions (Signed)
Diabetes and Foot Care Diabetes may cause you to have problems because of poor blood supply (circulation) to your feet and legs. This may cause the skin on your feet to become thinner, break easier, and heal more slowly. Your skin may become dry, and the skin may peel and crack. You may also have nerve damage in your legs and feet causing decreased feeling in them. You may not notice minor injuries to your feet that could lead to infections or more serious problems. Taking care of your feet is one of the most important things you can do for yourself.  HOME CARE INSTRUCTIONS  Wear shoes at all times, even in the house. Do not go barefoot. Bare feet are easily injured.  Check your feet daily for blisters, cuts, and redness. If you cannot see the bottom of your feet, use a mirror or ask someone for help.  Wash your feet with warm water (do not use hot water) and mild soap. Then pat your feet and the areas between your toes until they are completely dry. Do not soak your feet as this can dry your skin.  Apply a moisturizing lotion or petroleum jelly (that does not contain alcohol and is unscented) to the skin on your feet and to dry, brittle toenails. Do not apply lotion between your toes.  Trim your toenails straight across. Do not dig under them or around the cuticle. File the edges of your nails with an emery board or nail file.  Do not cut corns or calluses or try to remove them with medicine.  Wear clean socks or stockings every day. Make sure they are not too tight. Do not wear knee-high stockings since they may decrease blood flow to your legs.  Wear shoes that fit properly and have enough cushioning. To break in new shoes, wear them for just a few hours a day. This prevents you from injuring your feet. Always look in your shoes before you put them on to be sure there are no objects inside.  Do not cross your legs. This may decrease the blood flow to your feet.  If you find a minor scrape,  cut, or break in the skin on your feet, keep it and the skin around it clean and dry. These areas may be cleansed with mild soap and water. Do not cleanse the area with peroxide, alcohol, or iodine.  When you remove an adhesive bandage, be sure not to damage the skin around it.  If you have a wound, look at it several times a day to make sure it is healing.  Do not use heating pads or hot water bottles. They may burn your skin. If you have lost feeling in your feet or legs, you may not know it is happening until it is too late.  Make sure your health care provider performs a complete foot exam at least annually or more often if you have foot problems. Report any cuts, sores, or bruises to your health care provider immediately. SEEK MEDICAL CARE IF:   You have an injury that is not healing.  You have cuts or breaks in the skin.  You have an ingrown nail.  You notice redness on your legs or feet.  You feel burning or tingling in your legs or feet.  You have pain or cramps in your legs and feet.  Your legs or feet are numb.  Your feet always feel cold. SEEK IMMEDIATE MEDICAL CARE IF:   There is increasing redness,   swelling, or pain in or around a wound.  There is a red line that goes up your leg.  Pus is coming from a wound.  You develop a fever or as directed by your health care provider.  You notice a bad smell coming from an ulcer or wound.   This information is not intended to replace advice given to you by your health care provider. Make sure you discuss any questions you have with your health care provider.   Document Released: 08/17/2000 Document Revised: 04/22/2013 Document Reviewed: 01/27/2013 Elsevier Interactive Patient Education 2016 [employer]  

## 2015-12-21 NOTE — Progress Notes (Signed)
Patient here with complaints of chest pain over the past few days, reports that it has gotten worse today.  Patient denies SOB, dizziness, fatigue.  Patient reports pain as an "ache."   Patient rates pain 8/10.  Patient is not taking any medications.  Wants to "get things straightened out" to where he does not have to take so many pills, therefore he is not taking anything.   Patient blood sugar: 235 A1C= 9.5  [facility]-note He has had chest pain for approximately 2 days. Pain comes and goes and he states that the pain is worsened if he touches the area. Pain also increase if rolls onto left side and tries to slee p on that side.  He has a long medical hx including DM and ESRD on dialysis, htn,   Past Medical History  Diagnosis Date  . PVD (peripheral vascular disease) ([place])   . Shortness of breath dyspnea   . Constipation   . CHF (congestive heart failure) ([place]) 2015  . Diastolic congestive heart failure ([place])   . Diabetes mellitus 2015    type 2  . GERD (gastroesophageal reflux disease)     unsure  . Hypertension 2015  . Chronic renal disease, stage IV ([place]) 2015  . Cocaine abuse   . Cellulitis and abscess of foot 08/24/2011  . Protein calorie malnutrition ([place])   . Headache   . Multiple myeloma ([place])   . Anemia     Social History   Social History  . Marital Status: Legally Separated    Spouse Name: N/A  . Number of Children: N/A  . Years of Education: N/A   Occupational History  . Not on file.   Social History Main Topics  . Smoking status: Never Smoker   . Smokeless tobacco: Never Used  . Alcohol Use: No     Comment: once in a while per patient "beer/brandy"  . Drug Use: No     Comment: per pt* smoked marijuana as a teenager and last cocaine use was two months ago  . Sexual Activity: No   Other Topics Concern  . Not on file   Social History Narrative    Past Surgical History  Procedure Laterality Date  . Leg surgery    . Knee surgery Right     "metal plate  and screws" per patient   . Amputation  09/06/2011    Procedure: AMPUTATION RAY;  Surgeon: Wylene Simmer, MD;  Location: [facility];  Service: Orthopedics;  Laterality: Left;  Left Hallux Amputation  . Av fistula placement Left 08/12/2014    Procedure: CREATION OF A BRACHIOCEPHALIC ARTERIOVENOUS (AV) FISTULA  LEFT ARM;  Surgeon: Mal Misty, MD;  Location: [facility];  Service: Vascular;  Laterality: Left;  . Av fistula placement Right 10/07/2014    Procedure: Creation Right Arm Arteriovenous fistula;  Surgeon: Mal Misty, MD;  Location: [facility] OR;  Service: Vascular;  Laterality: Right;  . Appendectomy      Family History  Problem Relation Age of Onset  . Adopted: Yes  . Varicose Veins Sister     No Known Allergies  Current Outpatient Prescriptions on File Prior to Visit  Medication Sig Dispense Refill  . acetaminophen-codeine (TYLENOL #3) 300-30 MG tablet Take 1 tablet by mouth at bedtime as needed for moderate pain. 30 tablet 0  . aspirin 325 MG tablet Take 325 mg by mouth daily. Reported on 12/21/2015    . aspirin 81 MG chewable tablet Chew 1 tablet (81 mg total)  by mouth once. (Patient not taking: Reported on 09/07/2015) 30 tablet 5  . calcium acetate (PHOSLO) 667 MG capsule Take 2 capsules (1,334 mg total) by mouth 3 (three) times daily with meals. (Patient not taking: Reported on 12/21/2015) 180 capsule 0  . carvedilol (COREG) 25 MG tablet Take 25 mg by mouth 2 (two) times daily with a meal. Reported on 12/21/2015    . cinacalcet (SENSIPAR) 30 MG tablet Take 30 mg by mouth every other day. Reported on 12/21/2015    . diltiazem (CARDIZEM CD) 120 MG 24 hr capsule Take 1 capsule (120 mg total) by mouth daily. (Patient not taking: Reported on 12/21/2015) 30 capsule 5  . famotidine (PEPCID) 20 MG tablet Take 40 mg by mouth 2 (two) times daily. Reported on 12/21/2015    . glimepiride (AMARYL) 4 MG tablet Take 4 mg by mouth daily with breakfast. Reported on 12/21/2015    . lanthanum (FOSRENOL) 1000 MG  chewable tablet Chew 1,000 mg by mouth 3 (three) times daily with meals. Reported on 12/21/2015    . losartan (COZAAR) 25 MG tablet Take 25 mg by mouth daily. Reported on 12/21/2015    . mirtazapine (REMERON) 30 MG tablet Take 30 mg by mouth at bedtime. Reported on 12/21/2015    . montelukast (SINGULAIR) 10 MG tablet Take 10 mg by mouth at bedtime. Reported on 12/21/2015    . nitroGLYCERIN (NITROSTAT) 0.4 MG SL tablet Place 1 tablet (0.4 mg total) under the tongue every 5 (five) minutes as needed for chest pain. Do not mix with viagra (Patient not taking: Reported on 09/07/2015) 15 tablet 0  . predniSONE (DELTASONE) 20 MG tablet Take 1 tablet (20 mg total) by mouth daily with breakfast. (Patient not taking: Reported on 12/21/2015) 5 tablet 0  . sertraline (ZOLOFT) 50 MG tablet Take 1 tablet (50 mg total) by mouth daily. (Patient not taking: Reported on 09/07/2015) 30 tablet 3  . sevelamer carbonate (RENVELA) 800 MG tablet Take 800 mg by mouth 3 (three) times daily with meals. Reported on 12/21/2015    . sildenafil (VIAGRA) 50 MG tablet Take 0.5-1 tablets (25-50 mg total) by mouth daily as needed for erectile dysfunction. (Patient not taking: Reported on 09/07/2015) 10 tablet 0   No current facility-administered medications on file prior to visit.     patient denies chest pain, shortness of breath, orthopnea. Denies lower extremity edema, abdominal pain, change in appetite, change in bowel movements. Patient denies rashes, musculoskeletal complaints. No other specific complaints in a complete review of systems.   BP 133/84 mmHg  Pulse 94  Temp(Src) 97.6 F (36.4 C) (Oral)  Ht 5' 11"  (1.803 m)  Wt 206 lb (93.441 kg)  BMI 28.74 kg/m2  SpO2 95%  well-developed well-nourished male in no acute distress. HEENT exam atraumatic, normocephalic, neck supple without jugular venous distention. Chest clear to auscultation cardiac exam S1-S2 are regular. Tender chest wall anterior- to palpation. No friction rub  Abdominal exam overweight with bowel sounds, soft and nontender. Extremities - fistula-with thrill on right forearm  Chest pain New onset chest pain- sounds mechanical and pain is reproduced with palpation Will check EKG- if unchanged i don't think any further eval is necessary at this time  ekg is unremarkable- discussed with patient

## 2015-12-21 NOTE — Progress Notes (Signed)
Patient ID: [name], male   DOB: [birth year], 55 y.o.   MRN: [id number]  Subjective: This patient presents today for a scheduled visit requesting debridement of his toenails on the right and left feet Isn't known diabetic with end-stage renal disease on dialysis      Objective:   Physical Exam  patient appears pleasant orientated 3  Vascular: No peripheral edema noted bilaterally DP and PT pulses 0/4 bilaterally Capillary reflex within normal limits bilaterally  Neurological: Sensation to 10 g monofilament wire intact 4/5 bilaterally Vibratory sensation nonreactive right reactive left Ankle reflex were equal and reactive bilaterally  Dermatological: No open skin lesions bilaterally Dry atrophic skin bilaterally without hair growth The toenails are elongated, incurvated with occasional texture and color changes  Musculoskeletal: Amputation distal left hallux with well-healed incision Rigid hammertoe second left without any signs of irritation on the second left toe There is no restriction ankle, subtalar, midtarsal joints  chart review could not find any vascular examination    Assessment & Plan:   Assessment: Incurvated toenails 6-10 Decrease pedal pulses suggestive of possible peripheral arterial disease Diabetic peripheral neuropathy Hammertoe second left  Plan: Today review the results of the examination with patient today. I made him aware that I could not find any vascular examination in his medical record. As he has no open lesions and he can control the symptoms of the second left hammertoe I am recommending that he wears existing soft upper shoes to accommodate the hammertoe second left   The toenails 6-10 were debrided mechanically and elected without any bleeding  Reappoint 3 months

## 2015-12-22 DIAGNOSIS — Z23 Encounter for immunization: Secondary | ICD-10-CM | POA: Diagnosis not present

## 2015-12-22 DIAGNOSIS — D631 Anemia in chronic kidney disease: Secondary | ICD-10-CM | POA: Diagnosis not present

## 2015-12-22 DIAGNOSIS — D509 Iron deficiency anemia, unspecified: Secondary | ICD-10-CM | POA: Diagnosis not present

## 2015-12-22 DIAGNOSIS — N2581 Secondary hyperparathyroidism of renal origin: Secondary | ICD-10-CM | POA: Diagnosis not present

## 2015-12-22 DIAGNOSIS — E1129 Type 2 diabetes mellitus with other diabetic kidney complication: Secondary | ICD-10-CM | POA: Diagnosis not present

## 2015-12-22 DIAGNOSIS — N186 End stage renal disease: Secondary | ICD-10-CM | POA: Diagnosis not present

## 2015-12-24 DIAGNOSIS — Z23 Encounter for immunization: Secondary | ICD-10-CM | POA: Diagnosis not present

## 2015-12-24 DIAGNOSIS — N186 End stage renal disease: Secondary | ICD-10-CM | POA: Diagnosis not present

## 2015-12-24 DIAGNOSIS — N2581 Secondary hyperparathyroidism of renal origin: Secondary | ICD-10-CM | POA: Diagnosis not present

## 2015-12-24 DIAGNOSIS — D509 Iron deficiency anemia, unspecified: Secondary | ICD-10-CM | POA: Diagnosis not present

## 2015-12-24 DIAGNOSIS — D631 Anemia in chronic kidney disease: Secondary | ICD-10-CM | POA: Diagnosis not present

## 2015-12-24 DIAGNOSIS — E1129 Type 2 diabetes mellitus with other diabetic kidney complication: Secondary | ICD-10-CM | POA: Diagnosis not present

## 2015-12-27 DIAGNOSIS — E1129 Type 2 diabetes mellitus with other diabetic kidney complication: Secondary | ICD-10-CM | POA: Diagnosis not present

## 2015-12-27 DIAGNOSIS — N186 End stage renal disease: Secondary | ICD-10-CM | POA: Diagnosis not present

## 2015-12-27 DIAGNOSIS — D631 Anemia in chronic kidney disease: Secondary | ICD-10-CM | POA: Diagnosis not present

## 2015-12-27 DIAGNOSIS — D509 Iron deficiency anemia, unspecified: Secondary | ICD-10-CM | POA: Diagnosis not present

## 2015-12-27 DIAGNOSIS — Z23 Encounter for immunization: Secondary | ICD-10-CM | POA: Diagnosis not present

## 2015-12-27 DIAGNOSIS — N2581 Secondary hyperparathyroidism of renal origin: Secondary | ICD-10-CM | POA: Diagnosis not present

## 2015-12-29 DIAGNOSIS — D631 Anemia in chronic kidney disease: Secondary | ICD-10-CM | POA: Diagnosis not present

## 2015-12-29 DIAGNOSIS — D509 Iron deficiency anemia, unspecified: Secondary | ICD-10-CM | POA: Diagnosis not present

## 2015-12-29 DIAGNOSIS — N186 End stage renal disease: Secondary | ICD-10-CM | POA: Diagnosis not present

## 2015-12-29 DIAGNOSIS — N2581 Secondary hyperparathyroidism of renal origin: Secondary | ICD-10-CM | POA: Diagnosis not present

## 2015-12-29 DIAGNOSIS — E1129 Type 2 diabetes mellitus with other diabetic kidney complication: Secondary | ICD-10-CM | POA: Diagnosis not present

## 2015-12-29 DIAGNOSIS — Z23 Encounter for immunization: Secondary | ICD-10-CM | POA: Diagnosis not present

## 2015-12-31 DIAGNOSIS — Z23 Encounter for immunization: Secondary | ICD-10-CM | POA: Diagnosis not present

## 2015-12-31 DIAGNOSIS — D631 Anemia in chronic kidney disease: Secondary | ICD-10-CM | POA: Diagnosis not present

## 2015-12-31 DIAGNOSIS — E1129 Type 2 diabetes mellitus with other diabetic kidney complication: Secondary | ICD-10-CM | POA: Diagnosis not present

## 2015-12-31 DIAGNOSIS — D509 Iron deficiency anemia, unspecified: Secondary | ICD-10-CM | POA: Diagnosis not present

## 2015-12-31 DIAGNOSIS — N186 End stage renal disease: Secondary | ICD-10-CM | POA: Diagnosis not present

## 2015-12-31 DIAGNOSIS — N2581 Secondary hyperparathyroidism of renal origin: Secondary | ICD-10-CM | POA: Diagnosis not present

## 2016-01-01 DIAGNOSIS — Z992 Dependence on renal dialysis: Secondary | ICD-10-CM | POA: Diagnosis not present

## 2016-01-01 DIAGNOSIS — E1122 Type 2 diabetes mellitus with diabetic chronic kidney disease: Secondary | ICD-10-CM | POA: Diagnosis not present

## 2016-01-01 DIAGNOSIS — N186 End stage renal disease: Secondary | ICD-10-CM | POA: Diagnosis not present

## 2016-01-03 DIAGNOSIS — N186 End stage renal disease: Secondary | ICD-10-CM | POA: Diagnosis not present

## 2016-01-03 DIAGNOSIS — D509 Iron deficiency anemia, unspecified: Secondary | ICD-10-CM | POA: Diagnosis not present

## 2016-01-03 DIAGNOSIS — D631 Anemia in chronic kidney disease: Secondary | ICD-10-CM | POA: Diagnosis not present

## 2016-01-03 DIAGNOSIS — E1129 Type 2 diabetes mellitus with other diabetic kidney complication: Secondary | ICD-10-CM | POA: Diagnosis not present

## 2016-01-03 DIAGNOSIS — N2581 Secondary hyperparathyroidism of renal origin: Secondary | ICD-10-CM | POA: Diagnosis not present

## 2016-01-05 DIAGNOSIS — D509 Iron deficiency anemia, unspecified: Secondary | ICD-10-CM | POA: Diagnosis not present

## 2016-01-05 DIAGNOSIS — N186 End stage renal disease: Secondary | ICD-10-CM | POA: Diagnosis not present

## 2016-01-05 DIAGNOSIS — D631 Anemia in chronic kidney disease: Secondary | ICD-10-CM | POA: Diagnosis not present

## 2016-01-05 DIAGNOSIS — E1129 Type 2 diabetes mellitus with other diabetic kidney complication: Secondary | ICD-10-CM | POA: Diagnosis not present

## 2016-01-05 DIAGNOSIS — N2581 Secondary hyperparathyroidism of renal origin: Secondary | ICD-10-CM | POA: Diagnosis not present

## 2016-01-07 DIAGNOSIS — D631 Anemia in chronic kidney disease: Secondary | ICD-10-CM | POA: Diagnosis not present

## 2016-01-07 DIAGNOSIS — N186 End stage renal disease: Secondary | ICD-10-CM | POA: Diagnosis not present

## 2016-01-07 DIAGNOSIS — E1129 Type 2 diabetes mellitus with other diabetic kidney complication: Secondary | ICD-10-CM | POA: Diagnosis not present

## 2016-01-07 DIAGNOSIS — N2581 Secondary hyperparathyroidism of renal origin: Secondary | ICD-10-CM | POA: Diagnosis not present

## 2016-01-07 DIAGNOSIS — D509 Iron deficiency anemia, unspecified: Secondary | ICD-10-CM | POA: Diagnosis not present

## 2016-01-10 DIAGNOSIS — N186 End stage renal disease: Secondary | ICD-10-CM | POA: Diagnosis not present

## 2016-01-10 DIAGNOSIS — D509 Iron deficiency anemia, unspecified: Secondary | ICD-10-CM | POA: Diagnosis not present

## 2016-01-10 DIAGNOSIS — E1129 Type 2 diabetes mellitus with other diabetic kidney complication: Secondary | ICD-10-CM | POA: Diagnosis not present

## 2016-01-10 DIAGNOSIS — N2581 Secondary hyperparathyroidism of renal origin: Secondary | ICD-10-CM | POA: Diagnosis not present

## 2016-01-10 DIAGNOSIS — D631 Anemia in chronic kidney disease: Secondary | ICD-10-CM | POA: Diagnosis not present

## 2016-01-12 DIAGNOSIS — D509 Iron deficiency anemia, unspecified: Secondary | ICD-10-CM | POA: Diagnosis not present

## 2016-01-12 DIAGNOSIS — N186 End stage renal disease: Secondary | ICD-10-CM | POA: Diagnosis not present

## 2016-01-12 DIAGNOSIS — D631 Anemia in chronic kidney disease: Secondary | ICD-10-CM | POA: Diagnosis not present

## 2016-01-12 DIAGNOSIS — E1129 Type 2 diabetes mellitus with other diabetic kidney complication: Secondary | ICD-10-CM | POA: Diagnosis not present

## 2016-01-12 DIAGNOSIS — N2581 Secondary hyperparathyroidism of renal origin: Secondary | ICD-10-CM | POA: Diagnosis not present

## 2016-01-14 DIAGNOSIS — D509 Iron deficiency anemia, unspecified: Secondary | ICD-10-CM | POA: Diagnosis not present

## 2016-01-14 DIAGNOSIS — E1129 Type 2 diabetes mellitus with other diabetic kidney complication: Secondary | ICD-10-CM | POA: Diagnosis not present

## 2016-01-14 DIAGNOSIS — N2581 Secondary hyperparathyroidism of renal origin: Secondary | ICD-10-CM | POA: Diagnosis not present

## 2016-01-14 DIAGNOSIS — N186 End stage renal disease: Secondary | ICD-10-CM | POA: Diagnosis not present

## 2016-01-14 DIAGNOSIS — D631 Anemia in chronic kidney disease: Secondary | ICD-10-CM | POA: Diagnosis not present

## 2016-01-17 DIAGNOSIS — D631 Anemia in chronic kidney disease: Secondary | ICD-10-CM | POA: Diagnosis not present

## 2016-01-17 DIAGNOSIS — N186 End stage renal disease: Secondary | ICD-10-CM | POA: Diagnosis not present

## 2016-01-17 DIAGNOSIS — E1129 Type 2 diabetes mellitus with other diabetic kidney complication: Secondary | ICD-10-CM | POA: Diagnosis not present

## 2016-01-17 DIAGNOSIS — D509 Iron deficiency anemia, unspecified: Secondary | ICD-10-CM | POA: Diagnosis not present

## 2016-01-17 DIAGNOSIS — N2581 Secondary hyperparathyroidism of renal origin: Secondary | ICD-10-CM | POA: Diagnosis not present

## 2016-01-19 DIAGNOSIS — D509 Iron deficiency anemia, unspecified: Secondary | ICD-10-CM | POA: Diagnosis not present

## 2016-01-19 DIAGNOSIS — N2581 Secondary hyperparathyroidism of renal origin: Secondary | ICD-10-CM | POA: Diagnosis not present

## 2016-01-19 DIAGNOSIS — E1129 Type 2 diabetes mellitus with other diabetic kidney complication: Secondary | ICD-10-CM | POA: Diagnosis not present

## 2016-01-19 DIAGNOSIS — D631 Anemia in chronic kidney disease: Secondary | ICD-10-CM | POA: Diagnosis not present

## 2016-01-19 DIAGNOSIS — N186 End stage renal disease: Secondary | ICD-10-CM | POA: Diagnosis not present

## 2016-01-21 DIAGNOSIS — N2581 Secondary hyperparathyroidism of renal origin: Secondary | ICD-10-CM | POA: Diagnosis not present

## 2016-01-21 DIAGNOSIS — D631 Anemia in chronic kidney disease: Secondary | ICD-10-CM | POA: Diagnosis not present

## 2016-01-21 DIAGNOSIS — D509 Iron deficiency anemia, unspecified: Secondary | ICD-10-CM | POA: Diagnosis not present

## 2016-01-21 DIAGNOSIS — N186 End stage renal disease: Secondary | ICD-10-CM | POA: Diagnosis not present

## 2016-01-21 DIAGNOSIS — E1129 Type 2 diabetes mellitus with other diabetic kidney complication: Secondary | ICD-10-CM | POA: Diagnosis not present

## 2016-01-23 DIAGNOSIS — Z992 Dependence on renal dialysis: Secondary | ICD-10-CM | POA: Diagnosis not present

## 2016-01-23 DIAGNOSIS — I871 Compression of vein: Secondary | ICD-10-CM | POA: Diagnosis not present

## 2016-01-23 DIAGNOSIS — T82858D Stenosis of vascular prosthetic devices, implants and grafts, subsequent encounter: Secondary | ICD-10-CM | POA: Diagnosis not present

## 2016-01-23 DIAGNOSIS — N186 End stage renal disease: Secondary | ICD-10-CM | POA: Diagnosis not present

## 2016-01-24 DIAGNOSIS — E1129 Type 2 diabetes mellitus with other diabetic kidney complication: Secondary | ICD-10-CM | POA: Diagnosis not present

## 2016-01-24 DIAGNOSIS — N186 End stage renal disease: Secondary | ICD-10-CM | POA: Diagnosis not present

## 2016-01-24 DIAGNOSIS — D631 Anemia in chronic kidney disease: Secondary | ICD-10-CM | POA: Diagnosis not present

## 2016-01-24 DIAGNOSIS — D509 Iron deficiency anemia, unspecified: Secondary | ICD-10-CM | POA: Diagnosis not present

## 2016-01-24 DIAGNOSIS — N2581 Secondary hyperparathyroidism of renal origin: Secondary | ICD-10-CM | POA: Diagnosis not present

## 2016-01-26 DIAGNOSIS — D631 Anemia in chronic kidney disease: Secondary | ICD-10-CM | POA: Diagnosis not present

## 2016-01-26 DIAGNOSIS — N2581 Secondary hyperparathyroidism of renal origin: Secondary | ICD-10-CM | POA: Diagnosis not present

## 2016-01-26 DIAGNOSIS — D509 Iron deficiency anemia, unspecified: Secondary | ICD-10-CM | POA: Diagnosis not present

## 2016-01-26 DIAGNOSIS — N186 End stage renal disease: Secondary | ICD-10-CM | POA: Diagnosis not present

## 2016-01-26 DIAGNOSIS — E1129 Type 2 diabetes mellitus with other diabetic kidney complication: Secondary | ICD-10-CM | POA: Diagnosis not present

## 2016-01-28 DIAGNOSIS — D509 Iron deficiency anemia, unspecified: Secondary | ICD-10-CM | POA: Diagnosis not present

## 2016-01-28 DIAGNOSIS — E1129 Type 2 diabetes mellitus with other diabetic kidney complication: Secondary | ICD-10-CM | POA: Diagnosis not present

## 2016-01-28 DIAGNOSIS — N2581 Secondary hyperparathyroidism of renal origin: Secondary | ICD-10-CM | POA: Diagnosis not present

## 2016-01-28 DIAGNOSIS — D631 Anemia in chronic kidney disease: Secondary | ICD-10-CM | POA: Diagnosis not present

## 2016-01-28 DIAGNOSIS — N186 End stage renal disease: Secondary | ICD-10-CM | POA: Diagnosis not present

## 2016-01-31 DIAGNOSIS — D631 Anemia in chronic kidney disease: Secondary | ICD-10-CM | POA: Diagnosis not present

## 2016-01-31 DIAGNOSIS — D509 Iron deficiency anemia, unspecified: Secondary | ICD-10-CM | POA: Diagnosis not present

## 2016-01-31 DIAGNOSIS — N186 End stage renal disease: Secondary | ICD-10-CM | POA: Diagnosis not present

## 2016-01-31 DIAGNOSIS — E1129 Type 2 diabetes mellitus with other diabetic kidney complication: Secondary | ICD-10-CM | POA: Diagnosis not present

## 2016-01-31 DIAGNOSIS — N2581 Secondary hyperparathyroidism of renal origin: Secondary | ICD-10-CM | POA: Diagnosis not present

## 2016-02-01 DIAGNOSIS — E1122 Type 2 diabetes mellitus with diabetic chronic kidney disease: Secondary | ICD-10-CM | POA: Diagnosis not present

## 2016-02-01 DIAGNOSIS — N186 End stage renal disease: Secondary | ICD-10-CM | POA: Diagnosis not present

## 2016-02-01 DIAGNOSIS — Z992 Dependence on renal dialysis: Secondary | ICD-10-CM | POA: Diagnosis not present

## 2016-02-02 DIAGNOSIS — E1129 Type 2 diabetes mellitus with other diabetic kidney complication: Secondary | ICD-10-CM | POA: Diagnosis not present

## 2016-02-02 DIAGNOSIS — D509 Iron deficiency anemia, unspecified: Secondary | ICD-10-CM | POA: Diagnosis not present

## 2016-02-02 DIAGNOSIS — N186 End stage renal disease: Secondary | ICD-10-CM | POA: Diagnosis not present

## 2016-02-02 DIAGNOSIS — D631 Anemia in chronic kidney disease: Secondary | ICD-10-CM | POA: Diagnosis not present

## 2016-02-02 DIAGNOSIS — N2581 Secondary hyperparathyroidism of renal origin: Secondary | ICD-10-CM | POA: Diagnosis not present

## 2016-02-04 DIAGNOSIS — D509 Iron deficiency anemia, unspecified: Secondary | ICD-10-CM | POA: Diagnosis not present

## 2016-02-04 DIAGNOSIS — D631 Anemia in chronic kidney disease: Secondary | ICD-10-CM | POA: Diagnosis not present

## 2016-02-04 DIAGNOSIS — E1129 Type 2 diabetes mellitus with other diabetic kidney complication: Secondary | ICD-10-CM | POA: Diagnosis not present

## 2016-02-04 DIAGNOSIS — N186 End stage renal disease: Secondary | ICD-10-CM | POA: Diagnosis not present

## 2016-02-04 DIAGNOSIS — N2581 Secondary hyperparathyroidism of renal origin: Secondary | ICD-10-CM | POA: Diagnosis not present

## 2016-02-07 DIAGNOSIS — N186 End stage renal disease: Secondary | ICD-10-CM | POA: Diagnosis not present

## 2016-02-07 DIAGNOSIS — E1129 Type 2 diabetes mellitus with other diabetic kidney complication: Secondary | ICD-10-CM | POA: Diagnosis not present

## 2016-02-07 DIAGNOSIS — N2581 Secondary hyperparathyroidism of renal origin: Secondary | ICD-10-CM | POA: Diagnosis not present

## 2016-02-07 DIAGNOSIS — D631 Anemia in chronic kidney disease: Secondary | ICD-10-CM | POA: Diagnosis not present

## 2016-02-07 DIAGNOSIS — D509 Iron deficiency anemia, unspecified: Secondary | ICD-10-CM | POA: Diagnosis not present

## 2016-02-09 DIAGNOSIS — N2581 Secondary hyperparathyroidism of renal origin: Secondary | ICD-10-CM | POA: Diagnosis not present

## 2016-02-09 DIAGNOSIS — D509 Iron deficiency anemia, unspecified: Secondary | ICD-10-CM | POA: Diagnosis not present

## 2016-02-09 DIAGNOSIS — D631 Anemia in chronic kidney disease: Secondary | ICD-10-CM | POA: Diagnosis not present

## 2016-02-09 DIAGNOSIS — N186 End stage renal disease: Secondary | ICD-10-CM | POA: Diagnosis not present

## 2016-02-09 DIAGNOSIS — E1129 Type 2 diabetes mellitus with other diabetic kidney complication: Secondary | ICD-10-CM | POA: Diagnosis not present

## 2016-02-11 DIAGNOSIS — E1129 Type 2 diabetes mellitus with other diabetic kidney complication: Secondary | ICD-10-CM | POA: Diagnosis not present

## 2016-02-11 DIAGNOSIS — D509 Iron deficiency anemia, unspecified: Secondary | ICD-10-CM | POA: Diagnosis not present

## 2016-02-11 DIAGNOSIS — D631 Anemia in chronic kidney disease: Secondary | ICD-10-CM | POA: Diagnosis not present

## 2016-02-11 DIAGNOSIS — N186 End stage renal disease: Secondary | ICD-10-CM | POA: Diagnosis not present

## 2016-02-11 DIAGNOSIS — N2581 Secondary hyperparathyroidism of renal origin: Secondary | ICD-10-CM | POA: Diagnosis not present

## 2016-02-14 DIAGNOSIS — D509 Iron deficiency anemia, unspecified: Secondary | ICD-10-CM | POA: Diagnosis not present

## 2016-02-14 DIAGNOSIS — D631 Anemia in chronic kidney disease: Secondary | ICD-10-CM | POA: Diagnosis not present

## 2016-02-14 DIAGNOSIS — E1129 Type 2 diabetes mellitus with other diabetic kidney complication: Secondary | ICD-10-CM | POA: Diagnosis not present

## 2016-02-14 DIAGNOSIS — N186 End stage renal disease: Secondary | ICD-10-CM | POA: Diagnosis not present

## 2016-02-14 DIAGNOSIS — N2581 Secondary hyperparathyroidism of renal origin: Secondary | ICD-10-CM | POA: Diagnosis not present

## 2016-02-16 DIAGNOSIS — D631 Anemia in chronic kidney disease: Secondary | ICD-10-CM | POA: Diagnosis not present

## 2016-02-16 DIAGNOSIS — N2581 Secondary hyperparathyroidism of renal origin: Secondary | ICD-10-CM | POA: Diagnosis not present

## 2016-02-16 DIAGNOSIS — D509 Iron deficiency anemia, unspecified: Secondary | ICD-10-CM | POA: Diagnosis not present

## 2016-02-16 DIAGNOSIS — E1129 Type 2 diabetes mellitus with other diabetic kidney complication: Secondary | ICD-10-CM | POA: Diagnosis not present

## 2016-02-16 DIAGNOSIS — N186 End stage renal disease: Secondary | ICD-10-CM | POA: Diagnosis not present

## 2016-02-18 DIAGNOSIS — N2581 Secondary hyperparathyroidism of renal origin: Secondary | ICD-10-CM | POA: Diagnosis not present

## 2016-02-18 DIAGNOSIS — D631 Anemia in chronic kidney disease: Secondary | ICD-10-CM | POA: Diagnosis not present

## 2016-02-18 DIAGNOSIS — D509 Iron deficiency anemia, unspecified: Secondary | ICD-10-CM | POA: Diagnosis not present

## 2016-02-18 DIAGNOSIS — E1129 Type 2 diabetes mellitus with other diabetic kidney complication: Secondary | ICD-10-CM | POA: Diagnosis not present

## 2016-02-18 DIAGNOSIS — N186 End stage renal disease: Secondary | ICD-10-CM | POA: Diagnosis not present

## 2016-02-21 DIAGNOSIS — E1129 Type 2 diabetes mellitus with other diabetic kidney complication: Secondary | ICD-10-CM | POA: Diagnosis not present

## 2016-02-21 DIAGNOSIS — D509 Iron deficiency anemia, unspecified: Secondary | ICD-10-CM | POA: Diagnosis not present

## 2016-02-21 DIAGNOSIS — D631 Anemia in chronic kidney disease: Secondary | ICD-10-CM | POA: Diagnosis not present

## 2016-02-21 DIAGNOSIS — N186 End stage renal disease: Secondary | ICD-10-CM | POA: Diagnosis not present

## 2016-02-21 DIAGNOSIS — N2581 Secondary hyperparathyroidism of renal origin: Secondary | ICD-10-CM | POA: Diagnosis not present

## 2016-02-23 DIAGNOSIS — N186 End stage renal disease: Secondary | ICD-10-CM | POA: Diagnosis not present

## 2016-02-23 DIAGNOSIS — E1129 Type 2 diabetes mellitus with other diabetic kidney complication: Secondary | ICD-10-CM | POA: Diagnosis not present

## 2016-02-23 DIAGNOSIS — D509 Iron deficiency anemia, unspecified: Secondary | ICD-10-CM | POA: Diagnosis not present

## 2016-02-23 DIAGNOSIS — D631 Anemia in chronic kidney disease: Secondary | ICD-10-CM | POA: Diagnosis not present

## 2016-02-23 DIAGNOSIS — N2581 Secondary hyperparathyroidism of renal origin: Secondary | ICD-10-CM | POA: Diagnosis not present

## 2016-02-25 DIAGNOSIS — N186 End stage renal disease: Secondary | ICD-10-CM | POA: Diagnosis not present

## 2016-02-25 DIAGNOSIS — N2581 Secondary hyperparathyroidism of renal origin: Secondary | ICD-10-CM | POA: Diagnosis not present

## 2016-02-25 DIAGNOSIS — D509 Iron deficiency anemia, unspecified: Secondary | ICD-10-CM | POA: Diagnosis not present

## 2016-02-25 DIAGNOSIS — E1129 Type 2 diabetes mellitus with other diabetic kidney complication: Secondary | ICD-10-CM | POA: Diagnosis not present

## 2016-02-25 DIAGNOSIS — D631 Anemia in chronic kidney disease: Secondary | ICD-10-CM | POA: Diagnosis not present

## 2016-02-28 DIAGNOSIS — D631 Anemia in chronic kidney disease: Secondary | ICD-10-CM | POA: Diagnosis not present

## 2016-02-28 DIAGNOSIS — N186 End stage renal disease: Secondary | ICD-10-CM | POA: Diagnosis not present

## 2016-02-28 DIAGNOSIS — D509 Iron deficiency anemia, unspecified: Secondary | ICD-10-CM | POA: Diagnosis not present

## 2016-02-28 DIAGNOSIS — N2581 Secondary hyperparathyroidism of renal origin: Secondary | ICD-10-CM | POA: Diagnosis not present

## 2016-02-28 DIAGNOSIS — E1129 Type 2 diabetes mellitus with other diabetic kidney complication: Secondary | ICD-10-CM | POA: Diagnosis not present

## 2016-03-01 DIAGNOSIS — D631 Anemia in chronic kidney disease: Secondary | ICD-10-CM | POA: Diagnosis not present

## 2016-03-01 DIAGNOSIS — N2581 Secondary hyperparathyroidism of renal origin: Secondary | ICD-10-CM | POA: Diagnosis not present

## 2016-03-01 DIAGNOSIS — E1129 Type 2 diabetes mellitus with other diabetic kidney complication: Secondary | ICD-10-CM | POA: Diagnosis not present

## 2016-03-01 DIAGNOSIS — N186 End stage renal disease: Secondary | ICD-10-CM | POA: Diagnosis not present

## 2016-03-01 DIAGNOSIS — D509 Iron deficiency anemia, unspecified: Secondary | ICD-10-CM | POA: Diagnosis not present

## 2016-03-02 DIAGNOSIS — N186 End stage renal disease: Secondary | ICD-10-CM | POA: Diagnosis not present

## 2016-03-02 DIAGNOSIS — Z992 Dependence on renal dialysis: Secondary | ICD-10-CM | POA: Diagnosis not present

## 2016-03-02 DIAGNOSIS — E1122 Type 2 diabetes mellitus with diabetic chronic kidney disease: Secondary | ICD-10-CM | POA: Diagnosis not present

## 2016-03-03 DIAGNOSIS — N186 End stage renal disease: Secondary | ICD-10-CM | POA: Diagnosis not present

## 2016-03-03 DIAGNOSIS — N2581 Secondary hyperparathyroidism of renal origin: Secondary | ICD-10-CM | POA: Diagnosis not present

## 2016-03-03 DIAGNOSIS — E1129 Type 2 diabetes mellitus with other diabetic kidney complication: Secondary | ICD-10-CM | POA: Diagnosis not present

## 2016-03-03 DIAGNOSIS — D631 Anemia in chronic kidney disease: Secondary | ICD-10-CM | POA: Diagnosis not present

## 2016-03-03 DIAGNOSIS — D509 Iron deficiency anemia, unspecified: Secondary | ICD-10-CM | POA: Diagnosis not present

## 2016-03-06 DIAGNOSIS — D631 Anemia in chronic kidney disease: Secondary | ICD-10-CM | POA: Diagnosis not present

## 2016-03-06 DIAGNOSIS — E1129 Type 2 diabetes mellitus with other diabetic kidney complication: Secondary | ICD-10-CM | POA: Diagnosis not present

## 2016-03-06 DIAGNOSIS — N2581 Secondary hyperparathyroidism of renal origin: Secondary | ICD-10-CM | POA: Diagnosis not present

## 2016-03-06 DIAGNOSIS — D509 Iron deficiency anemia, unspecified: Secondary | ICD-10-CM | POA: Diagnosis not present

## 2016-03-06 DIAGNOSIS — N186 End stage renal disease: Secondary | ICD-10-CM | POA: Diagnosis not present

## 2016-03-08 DIAGNOSIS — D509 Iron deficiency anemia, unspecified: Secondary | ICD-10-CM | POA: Diagnosis not present

## 2016-03-08 DIAGNOSIS — N2581 Secondary hyperparathyroidism of renal origin: Secondary | ICD-10-CM | POA: Diagnosis not present

## 2016-03-08 DIAGNOSIS — E1129 Type 2 diabetes mellitus with other diabetic kidney complication: Secondary | ICD-10-CM | POA: Diagnosis not present

## 2016-03-08 DIAGNOSIS — N186 End stage renal disease: Secondary | ICD-10-CM | POA: Diagnosis not present

## 2016-03-08 DIAGNOSIS — D631 Anemia in chronic kidney disease: Secondary | ICD-10-CM | POA: Diagnosis not present

## 2016-03-13 DIAGNOSIS — E1129 Type 2 diabetes mellitus with other diabetic kidney complication: Secondary | ICD-10-CM | POA: Diagnosis not present

## 2016-03-13 DIAGNOSIS — D509 Iron deficiency anemia, unspecified: Secondary | ICD-10-CM | POA: Diagnosis not present

## 2016-03-13 DIAGNOSIS — D631 Anemia in chronic kidney disease: Secondary | ICD-10-CM | POA: Diagnosis not present

## 2016-03-13 DIAGNOSIS — N2581 Secondary hyperparathyroidism of renal origin: Secondary | ICD-10-CM | POA: Diagnosis not present

## 2016-03-13 DIAGNOSIS — N186 End stage renal disease: Secondary | ICD-10-CM | POA: Diagnosis not present

## 2016-03-15 DIAGNOSIS — E1129 Type 2 diabetes mellitus with other diabetic kidney complication: Secondary | ICD-10-CM | POA: Diagnosis not present

## 2016-03-15 DIAGNOSIS — D509 Iron deficiency anemia, unspecified: Secondary | ICD-10-CM | POA: Diagnosis not present

## 2016-03-15 DIAGNOSIS — N2581 Secondary hyperparathyroidism of renal origin: Secondary | ICD-10-CM | POA: Diagnosis not present

## 2016-03-15 DIAGNOSIS — D631 Anemia in chronic kidney disease: Secondary | ICD-10-CM | POA: Diagnosis not present

## 2016-03-15 DIAGNOSIS — N186 End stage renal disease: Secondary | ICD-10-CM | POA: Diagnosis not present

## 2016-03-17 DIAGNOSIS — D509 Iron deficiency anemia, unspecified: Secondary | ICD-10-CM | POA: Diagnosis not present

## 2016-03-17 DIAGNOSIS — N2581 Secondary hyperparathyroidism of renal origin: Secondary | ICD-10-CM | POA: Diagnosis not present

## 2016-03-17 DIAGNOSIS — E1129 Type 2 diabetes mellitus with other diabetic kidney complication: Secondary | ICD-10-CM | POA: Diagnosis not present

## 2016-03-17 DIAGNOSIS — N186 End stage renal disease: Secondary | ICD-10-CM | POA: Diagnosis not present

## 2016-03-17 DIAGNOSIS — D631 Anemia in chronic kidney disease: Secondary | ICD-10-CM | POA: Diagnosis not present

## 2016-03-20 DIAGNOSIS — D509 Iron deficiency anemia, unspecified: Secondary | ICD-10-CM | POA: Diagnosis not present

## 2016-03-20 DIAGNOSIS — E1129 Type 2 diabetes mellitus with other diabetic kidney complication: Secondary | ICD-10-CM | POA: Diagnosis not present

## 2016-03-20 DIAGNOSIS — N2581 Secondary hyperparathyroidism of renal origin: Secondary | ICD-10-CM | POA: Diagnosis not present

## 2016-03-20 DIAGNOSIS — D631 Anemia in chronic kidney disease: Secondary | ICD-10-CM | POA: Diagnosis not present

## 2016-03-20 DIAGNOSIS — N186 End stage renal disease: Secondary | ICD-10-CM | POA: Diagnosis not present

## 2016-03-22 DIAGNOSIS — E1129 Type 2 diabetes mellitus with other diabetic kidney complication: Secondary | ICD-10-CM | POA: Diagnosis not present

## 2016-03-22 DIAGNOSIS — N2581 Secondary hyperparathyroidism of renal origin: Secondary | ICD-10-CM | POA: Diagnosis not present

## 2016-03-22 DIAGNOSIS — N186 End stage renal disease: Secondary | ICD-10-CM | POA: Diagnosis not present

## 2016-03-22 DIAGNOSIS — D631 Anemia in chronic kidney disease: Secondary | ICD-10-CM | POA: Diagnosis not present

## 2016-03-22 DIAGNOSIS — D509 Iron deficiency anemia, unspecified: Secondary | ICD-10-CM | POA: Diagnosis not present

## 2016-03-24 DIAGNOSIS — E1129 Type 2 diabetes mellitus with other diabetic kidney complication: Secondary | ICD-10-CM | POA: Diagnosis not present

## 2016-03-24 DIAGNOSIS — D509 Iron deficiency anemia, unspecified: Secondary | ICD-10-CM | POA: Diagnosis not present

## 2016-03-24 DIAGNOSIS — N2581 Secondary hyperparathyroidism of renal origin: Secondary | ICD-10-CM | POA: Diagnosis not present

## 2016-03-24 DIAGNOSIS — D631 Anemia in chronic kidney disease: Secondary | ICD-10-CM | POA: Diagnosis not present

## 2016-03-24 DIAGNOSIS — N186 End stage renal disease: Secondary | ICD-10-CM | POA: Diagnosis not present

## 2016-03-27 DIAGNOSIS — N2581 Secondary hyperparathyroidism of renal origin: Secondary | ICD-10-CM | POA: Diagnosis not present

## 2016-03-27 DIAGNOSIS — D509 Iron deficiency anemia, unspecified: Secondary | ICD-10-CM | POA: Diagnosis not present

## 2016-03-27 DIAGNOSIS — E1129 Type 2 diabetes mellitus with other diabetic kidney complication: Secondary | ICD-10-CM | POA: Diagnosis not present

## 2016-03-27 DIAGNOSIS — N186 End stage renal disease: Secondary | ICD-10-CM | POA: Diagnosis not present

## 2016-03-27 DIAGNOSIS — D631 Anemia in chronic kidney disease: Secondary | ICD-10-CM | POA: Diagnosis not present

## 2016-03-28 ENCOUNTER — Ambulatory Visit (INDEPENDENT_AMBULATORY_CARE_PROVIDER_SITE_OTHER): Payer: Medicare Other | Admitting: Podiatry

## 2016-03-29 DIAGNOSIS — D509 Iron deficiency anemia, unspecified: Secondary | ICD-10-CM | POA: Diagnosis not present

## 2016-03-29 DIAGNOSIS — E1129 Type 2 diabetes mellitus with other diabetic kidney complication: Secondary | ICD-10-CM | POA: Diagnosis not present

## 2016-03-29 DIAGNOSIS — N186 End stage renal disease: Secondary | ICD-10-CM | POA: Diagnosis not present

## 2016-03-29 DIAGNOSIS — D631 Anemia in chronic kidney disease: Secondary | ICD-10-CM | POA: Diagnosis not present

## 2016-03-29 DIAGNOSIS — N2581 Secondary hyperparathyroidism of renal origin: Secondary | ICD-10-CM | POA: Diagnosis not present

## 2016-03-29 NOTE — Progress Notes (Signed)
Patient ID: [name], male   DOB: [birth year], 55 y.o.   MRN: [id number]   No-show Erroneous encounter

## 2016-03-31 DIAGNOSIS — D509 Iron deficiency anemia, unspecified: Secondary | ICD-10-CM | POA: Diagnosis not present

## 2016-03-31 DIAGNOSIS — N2581 Secondary hyperparathyroidism of renal origin: Secondary | ICD-10-CM | POA: Diagnosis not present

## 2016-03-31 DIAGNOSIS — D631 Anemia in chronic kidney disease: Secondary | ICD-10-CM | POA: Diagnosis not present

## 2016-03-31 DIAGNOSIS — N186 End stage renal disease: Secondary | ICD-10-CM | POA: Diagnosis not present

## 2016-03-31 DIAGNOSIS — E1129 Type 2 diabetes mellitus with other diabetic kidney complication: Secondary | ICD-10-CM | POA: Diagnosis not present

## 2016-04-02 DIAGNOSIS — N186 End stage renal disease: Secondary | ICD-10-CM | POA: Diagnosis not present

## 2016-04-02 DIAGNOSIS — Z992 Dependence on renal dialysis: Secondary | ICD-10-CM | POA: Diagnosis not present

## 2016-04-02 DIAGNOSIS — E1122 Type 2 diabetes mellitus with diabetic chronic kidney disease: Secondary | ICD-10-CM | POA: Diagnosis not present

## 2016-04-03 DIAGNOSIS — N186 End stage renal disease: Secondary | ICD-10-CM | POA: Diagnosis not present

## 2016-04-03 DIAGNOSIS — D631 Anemia in chronic kidney disease: Secondary | ICD-10-CM | POA: Diagnosis not present

## 2016-04-03 DIAGNOSIS — D509 Iron deficiency anemia, unspecified: Secondary | ICD-10-CM | POA: Diagnosis not present

## 2016-04-03 DIAGNOSIS — E1129 Type 2 diabetes mellitus with other diabetic kidney complication: Secondary | ICD-10-CM | POA: Diagnosis not present

## 2016-04-03 DIAGNOSIS — N2581 Secondary hyperparathyroidism of renal origin: Secondary | ICD-10-CM | POA: Diagnosis not present

## 2016-04-04 DIAGNOSIS — T82858D Stenosis of vascular prosthetic devices, implants and grafts, subsequent encounter: Secondary | ICD-10-CM | POA: Diagnosis not present

## 2016-04-04 DIAGNOSIS — N186 End stage renal disease: Secondary | ICD-10-CM | POA: Diagnosis not present

## 2016-04-04 DIAGNOSIS — Z992 Dependence on renal dialysis: Secondary | ICD-10-CM | POA: Diagnosis not present

## 2016-04-04 DIAGNOSIS — I871 Compression of vein: Secondary | ICD-10-CM | POA: Diagnosis not present

## 2016-04-05 DIAGNOSIS — N186 End stage renal disease: Secondary | ICD-10-CM | POA: Diagnosis not present

## 2016-04-05 DIAGNOSIS — N2581 Secondary hyperparathyroidism of renal origin: Secondary | ICD-10-CM | POA: Diagnosis not present

## 2016-04-05 DIAGNOSIS — E1129 Type 2 diabetes mellitus with other diabetic kidney complication: Secondary | ICD-10-CM | POA: Diagnosis not present

## 2016-04-05 DIAGNOSIS — D509 Iron deficiency anemia, unspecified: Secondary | ICD-10-CM | POA: Diagnosis not present

## 2016-04-05 DIAGNOSIS — D631 Anemia in chronic kidney disease: Secondary | ICD-10-CM | POA: Diagnosis not present

## 2016-04-06 DIAGNOSIS — N2581 Secondary hyperparathyroidism of renal origin: Secondary | ICD-10-CM | POA: Diagnosis not present

## 2016-04-06 DIAGNOSIS — D509 Iron deficiency anemia, unspecified: Secondary | ICD-10-CM | POA: Diagnosis not present

## 2016-04-06 DIAGNOSIS — E1129 Type 2 diabetes mellitus with other diabetic kidney complication: Secondary | ICD-10-CM | POA: Diagnosis not present

## 2016-04-06 DIAGNOSIS — N186 End stage renal disease: Secondary | ICD-10-CM | POA: Diagnosis not present

## 2016-04-06 DIAGNOSIS — D631 Anemia in chronic kidney disease: Secondary | ICD-10-CM | POA: Diagnosis not present

## 2016-04-10 DIAGNOSIS — N2581 Secondary hyperparathyroidism of renal origin: Secondary | ICD-10-CM | POA: Diagnosis not present

## 2016-04-10 DIAGNOSIS — D509 Iron deficiency anemia, unspecified: Secondary | ICD-10-CM | POA: Diagnosis not present

## 2016-04-10 DIAGNOSIS — N186 End stage renal disease: Secondary | ICD-10-CM | POA: Diagnosis not present

## 2016-04-10 DIAGNOSIS — E1129 Type 2 diabetes mellitus with other diabetic kidney complication: Secondary | ICD-10-CM | POA: Diagnosis not present

## 2016-04-10 DIAGNOSIS — D631 Anemia in chronic kidney disease: Secondary | ICD-10-CM | POA: Diagnosis not present

## 2016-04-12 DIAGNOSIS — D631 Anemia in chronic kidney disease: Secondary | ICD-10-CM | POA: Diagnosis not present

## 2016-04-12 DIAGNOSIS — N186 End stage renal disease: Secondary | ICD-10-CM | POA: Diagnosis not present

## 2016-04-12 DIAGNOSIS — E1129 Type 2 diabetes mellitus with other diabetic kidney complication: Secondary | ICD-10-CM | POA: Diagnosis not present

## 2016-04-12 DIAGNOSIS — D509 Iron deficiency anemia, unspecified: Secondary | ICD-10-CM | POA: Diagnosis not present

## 2016-04-12 DIAGNOSIS — N2581 Secondary hyperparathyroidism of renal origin: Secondary | ICD-10-CM | POA: Diagnosis not present

## 2016-04-14 DIAGNOSIS — E1129 Type 2 diabetes mellitus with other diabetic kidney complication: Secondary | ICD-10-CM | POA: Diagnosis not present

## 2016-04-14 DIAGNOSIS — N186 End stage renal disease: Secondary | ICD-10-CM | POA: Diagnosis not present

## 2016-04-14 DIAGNOSIS — N2581 Secondary hyperparathyroidism of renal origin: Secondary | ICD-10-CM | POA: Diagnosis not present

## 2016-04-14 DIAGNOSIS — D631 Anemia in chronic kidney disease: Secondary | ICD-10-CM | POA: Diagnosis not present

## 2016-04-14 DIAGNOSIS — D509 Iron deficiency anemia, unspecified: Secondary | ICD-10-CM | POA: Diagnosis not present

## 2016-04-17 DIAGNOSIS — D631 Anemia in chronic kidney disease: Secondary | ICD-10-CM | POA: Diagnosis not present

## 2016-04-17 DIAGNOSIS — N2581 Secondary hyperparathyroidism of renal origin: Secondary | ICD-10-CM | POA: Diagnosis not present

## 2016-04-17 DIAGNOSIS — D509 Iron deficiency anemia, unspecified: Secondary | ICD-10-CM | POA: Diagnosis not present

## 2016-04-17 DIAGNOSIS — E1129 Type 2 diabetes mellitus with other diabetic kidney complication: Secondary | ICD-10-CM | POA: Diagnosis not present

## 2016-04-17 DIAGNOSIS — N186 End stage renal disease: Secondary | ICD-10-CM | POA: Diagnosis not present

## 2016-04-19 DIAGNOSIS — E1129 Type 2 diabetes mellitus with other diabetic kidney complication: Secondary | ICD-10-CM | POA: Diagnosis not present

## 2016-04-19 DIAGNOSIS — D631 Anemia in chronic kidney disease: Secondary | ICD-10-CM | POA: Diagnosis not present

## 2016-04-19 DIAGNOSIS — D509 Iron deficiency anemia, unspecified: Secondary | ICD-10-CM | POA: Diagnosis not present

## 2016-04-19 DIAGNOSIS — N2581 Secondary hyperparathyroidism of renal origin: Secondary | ICD-10-CM | POA: Diagnosis not present

## 2016-04-19 DIAGNOSIS — N186 End stage renal disease: Secondary | ICD-10-CM | POA: Diagnosis not present

## 2016-04-24 DIAGNOSIS — N2581 Secondary hyperparathyroidism of renal origin: Secondary | ICD-10-CM | POA: Diagnosis not present

## 2016-04-24 DIAGNOSIS — D631 Anemia in chronic kidney disease: Secondary | ICD-10-CM | POA: Diagnosis not present

## 2016-04-24 DIAGNOSIS — N186 End stage renal disease: Secondary | ICD-10-CM | POA: Diagnosis not present

## 2016-04-24 DIAGNOSIS — E1129 Type 2 diabetes mellitus with other diabetic kidney complication: Secondary | ICD-10-CM | POA: Diagnosis not present

## 2016-04-24 DIAGNOSIS — D509 Iron deficiency anemia, unspecified: Secondary | ICD-10-CM | POA: Diagnosis not present

## 2016-04-25 ENCOUNTER — Ambulatory Visit (INDEPENDENT_AMBULATORY_CARE_PROVIDER_SITE_OTHER): Payer: Medicare Other | Admitting: Podiatry

## 2016-04-25 ENCOUNTER — Encounter: Payer: Self-pay | Admitting: Podiatry

## 2016-04-25 DIAGNOSIS — E1151 Type 2 diabetes mellitus with diabetic peripheral angiopathy without gangrene: Secondary | ICD-10-CM | POA: Diagnosis not present

## 2016-04-25 DIAGNOSIS — L609 Nail disorder, unspecified: Secondary | ICD-10-CM

## 2016-04-25 DIAGNOSIS — L608 Other nail disorders: Secondary | ICD-10-CM

## 2016-04-25 NOTE — Patient Instructions (Signed)
Diabetes and Foot Care Diabetes may cause you to have problems because of poor blood supply (circulation) to your feet and legs. This may cause the skin on your feet to become thinner, break easier, and heal more slowly. Your skin may become dry, and the skin may peel and crack. You may also have nerve damage in your legs and feet causing decreased feeling in them. You may not notice minor injuries to your feet that could lead to infections or more serious problems. Taking care of your feet is one of the most important things you can do for yourself.  HOME CARE INSTRUCTIONS  Wear shoes at all times, even in the house. Do not go barefoot. Bare feet are easily injured.  Check your feet daily for blisters, cuts, and redness. If you cannot see the bottom of your feet, use a mirror or ask someone for help.  Wash your feet with warm water (do not use hot water) and mild soap. Then pat your feet and the areas between your toes until they are completely dry. Do not soak your feet as this can dry your skin.  Apply a moisturizing lotion or petroleum jelly (that does not contain alcohol and is unscented) to the skin on your feet and to dry, brittle toenails. Do not apply lotion between your toes.  Trim your toenails straight across. Do not dig under them or around the cuticle. File the edges of your nails with an emery board or nail file.  Do not cut corns or calluses or try to remove them with medicine.  Wear clean socks or stockings every day. Make sure they are not too tight. Do not wear knee-high stockings since they may decrease blood flow to your legs.  Wear shoes that fit properly and have enough cushioning. To break in new shoes, wear them for just a few hours a day. This prevents you from injuring your feet. Always look in your shoes before you put them on to be sure there are no objects inside.  Do not cross your legs. This may decrease the blood flow to your feet.  If you find a minor scrape,  cut, or break in the skin on your feet, keep it and the skin around it clean and dry. These areas may be cleansed with mild soap and water. Do not cleanse the area with peroxide, alcohol, or iodine.  When you remove an adhesive bandage, be sure not to damage the skin around it.  If you have a wound, look at it several times a day to make sure it is healing.  Do not use heating pads or hot water bottles. They may burn your skin. If you have lost feeling in your feet or legs, you may not know it is happening until it is too late.  Make sure your health care provider performs a complete foot exam at least annually or more often if you have foot problems. Report any cuts, sores, or bruises to your health care provider immediately. SEEK MEDICAL CARE IF:   You have an injury that is not healing.  You have cuts or breaks in the skin.  You have an ingrown nail.  You notice redness on your legs or feet.  You feel burning or tingling in your legs or feet.  You have pain or cramps in your legs and feet.  Your legs or feet are numb.  Your feet always feel cold. SEEK IMMEDIATE MEDICAL CARE IF:   There is increasing redness,   swelling, or pain in or around a wound.  There is a red line that goes up your leg.  Pus is coming from a wound.  You develop a fever or as directed by your health care provider.  You notice a bad smell coming from an ulcer or wound.   This information is not intended to replace advice given to you by your health care provider. Make sure you discuss any questions you have with your health care provider.   Document Released: 08/17/2000 Document Revised: 04/22/2013 Document Reviewed: 01/27/2013 Elsevier Interactive Patient Education 2016 [employer]  

## 2016-04-25 NOTE — Progress Notes (Signed)
Patient ID: ASH KOSIOR, male   DOB: 1961-05-29, 55 y.o.   MRN: [id number]    Subjective: This patient presents complaining of elongated toenails and recall request toenail debridement    Objective:  patient appears pleasant orientated 3  Vascular: No peripheral edema noted bilaterally DP and PT pulses 0/4 bilaterally Capillary reflex within normal limits bilaterally  Neurological: Sensation to 10 g monofilament wire intact 4/5 bilaterally Vibratory sensation nonreactive right reactive left Ankle reflex were equal and reactive bilaterally  Dermatological: No open skin lesions bilaterally Dry atrophic skin bilaterally without hair growth The toenails are elongated, incurvated with occasional texture and color changes  Musculoskeletal: Amputation distal left hallux with well-healed incision Rigid hammertoe second left without any signs of irritation on the second left toe There is no restriction ankle, subtalar, midtarsal joints  Assessment: Incurvated toenails 6-10 Decrease pedal pulses suggestive of possible peripheral arterial disease Diabetic peripheral neuropathy Hammertoe second left  Plan: Debridement toenails 6-10 mechanically and electrically without any bleeding  Reappoint 3 months

## 2016-04-26 DIAGNOSIS — N186 End stage renal disease: Secondary | ICD-10-CM | POA: Diagnosis not present

## 2016-04-26 DIAGNOSIS — D631 Anemia in chronic kidney disease: Secondary | ICD-10-CM | POA: Diagnosis not present

## 2016-04-26 DIAGNOSIS — N2581 Secondary hyperparathyroidism of renal origin: Secondary | ICD-10-CM | POA: Diagnosis not present

## 2016-04-28 DIAGNOSIS — N2581 Secondary hyperparathyroidism of renal origin: Secondary | ICD-10-CM | POA: Diagnosis not present

## 2016-04-28 DIAGNOSIS — D631 Anemia in chronic kidney disease: Secondary | ICD-10-CM | POA: Diagnosis not present

## 2016-04-28 DIAGNOSIS — N186 End stage renal disease: Secondary | ICD-10-CM | POA: Diagnosis not present

## 2016-05-01 DIAGNOSIS — N2581 Secondary hyperparathyroidism of renal origin: Secondary | ICD-10-CM | POA: Diagnosis not present

## 2016-05-01 DIAGNOSIS — D631 Anemia in chronic kidney disease: Secondary | ICD-10-CM | POA: Diagnosis not present

## 2016-05-01 DIAGNOSIS — N186 End stage renal disease: Secondary | ICD-10-CM | POA: Diagnosis not present

## 2016-05-03 DIAGNOSIS — Z992 Dependence on renal dialysis: Secondary | ICD-10-CM | POA: Diagnosis not present

## 2016-05-03 DIAGNOSIS — N2581 Secondary hyperparathyroidism of renal origin: Secondary | ICD-10-CM | POA: Diagnosis not present

## 2016-05-03 DIAGNOSIS — N186 End stage renal disease: Secondary | ICD-10-CM | POA: Diagnosis not present

## 2016-05-03 DIAGNOSIS — D631 Anemia in chronic kidney disease: Secondary | ICD-10-CM | POA: Diagnosis not present

## 2016-05-03 DIAGNOSIS — E1122 Type 2 diabetes mellitus with diabetic chronic kidney disease: Secondary | ICD-10-CM | POA: Diagnosis not present

## 2016-05-05 DIAGNOSIS — D631 Anemia in chronic kidney disease: Secondary | ICD-10-CM | POA: Diagnosis not present

## 2016-05-05 DIAGNOSIS — N2581 Secondary hyperparathyroidism of renal origin: Secondary | ICD-10-CM | POA: Diagnosis not present

## 2016-05-05 DIAGNOSIS — N186 End stage renal disease: Secondary | ICD-10-CM | POA: Diagnosis not present

## 2016-05-05 DIAGNOSIS — E1029 Type 1 diabetes mellitus with other diabetic kidney complication: Secondary | ICD-10-CM | POA: Diagnosis not present

## 2016-05-08 DIAGNOSIS — E1029 Type 1 diabetes mellitus with other diabetic kidney complication: Secondary | ICD-10-CM | POA: Diagnosis not present

## 2016-05-08 DIAGNOSIS — N186 End stage renal disease: Secondary | ICD-10-CM | POA: Diagnosis not present

## 2016-05-08 DIAGNOSIS — N2581 Secondary hyperparathyroidism of renal origin: Secondary | ICD-10-CM | POA: Diagnosis not present

## 2016-05-08 DIAGNOSIS — D631 Anemia in chronic kidney disease: Secondary | ICD-10-CM | POA: Diagnosis not present

## 2016-05-10 DIAGNOSIS — D631 Anemia in chronic kidney disease: Secondary | ICD-10-CM | POA: Diagnosis not present

## 2016-05-10 DIAGNOSIS — N186 End stage renal disease: Secondary | ICD-10-CM | POA: Diagnosis not present

## 2016-05-10 DIAGNOSIS — E1029 Type 1 diabetes mellitus with other diabetic kidney complication: Secondary | ICD-10-CM | POA: Diagnosis not present

## 2016-05-10 DIAGNOSIS — N2581 Secondary hyperparathyroidism of renal origin: Secondary | ICD-10-CM | POA: Diagnosis not present

## 2016-05-12 DIAGNOSIS — N186 End stage renal disease: Secondary | ICD-10-CM | POA: Diagnosis not present

## 2016-05-12 DIAGNOSIS — N2581 Secondary hyperparathyroidism of renal origin: Secondary | ICD-10-CM | POA: Diagnosis not present

## 2016-05-12 DIAGNOSIS — D631 Anemia in chronic kidney disease: Secondary | ICD-10-CM | POA: Diagnosis not present

## 2016-05-12 DIAGNOSIS — E1029 Type 1 diabetes mellitus with other diabetic kidney complication: Secondary | ICD-10-CM | POA: Diagnosis not present

## 2016-05-17 DIAGNOSIS — N186 End stage renal disease: Secondary | ICD-10-CM | POA: Diagnosis not present

## 2016-05-17 DIAGNOSIS — N2581 Secondary hyperparathyroidism of renal origin: Secondary | ICD-10-CM | POA: Diagnosis not present

## 2016-05-17 DIAGNOSIS — E1029 Type 1 diabetes mellitus with other diabetic kidney complication: Secondary | ICD-10-CM | POA: Diagnosis not present

## 2016-05-17 DIAGNOSIS — D631 Anemia in chronic kidney disease: Secondary | ICD-10-CM | POA: Diagnosis not present

## 2016-05-19 DIAGNOSIS — D631 Anemia in chronic kidney disease: Secondary | ICD-10-CM | POA: Diagnosis not present

## 2016-05-19 DIAGNOSIS — N2581 Secondary hyperparathyroidism of renal origin: Secondary | ICD-10-CM | POA: Diagnosis not present

## 2016-05-19 DIAGNOSIS — N186 End stage renal disease: Secondary | ICD-10-CM | POA: Diagnosis not present

## 2016-05-19 DIAGNOSIS — E1029 Type 1 diabetes mellitus with other diabetic kidney complication: Secondary | ICD-10-CM | POA: Diagnosis not present

## 2016-05-22 DIAGNOSIS — N186 End stage renal disease: Secondary | ICD-10-CM | POA: Diagnosis not present

## 2016-05-22 DIAGNOSIS — D631 Anemia in chronic kidney disease: Secondary | ICD-10-CM | POA: Diagnosis not present

## 2016-05-22 DIAGNOSIS — E1029 Type 1 diabetes mellitus with other diabetic kidney complication: Secondary | ICD-10-CM | POA: Diagnosis not present

## 2016-05-22 DIAGNOSIS — N2581 Secondary hyperparathyroidism of renal origin: Secondary | ICD-10-CM | POA: Diagnosis not present

## 2016-05-24 DIAGNOSIS — E1029 Type 1 diabetes mellitus with other diabetic kidney complication: Secondary | ICD-10-CM | POA: Diagnosis not present

## 2016-05-24 DIAGNOSIS — N186 End stage renal disease: Secondary | ICD-10-CM | POA: Diagnosis not present

## 2016-05-24 DIAGNOSIS — N2581 Secondary hyperparathyroidism of renal origin: Secondary | ICD-10-CM | POA: Diagnosis not present

## 2016-05-24 DIAGNOSIS — D631 Anemia in chronic kidney disease: Secondary | ICD-10-CM | POA: Diagnosis not present

## 2016-05-26 DIAGNOSIS — N186 End stage renal disease: Secondary | ICD-10-CM | POA: Diagnosis not present

## 2016-05-26 DIAGNOSIS — D631 Anemia in chronic kidney disease: Secondary | ICD-10-CM | POA: Diagnosis not present

## 2016-05-26 DIAGNOSIS — E1029 Type 1 diabetes mellitus with other diabetic kidney complication: Secondary | ICD-10-CM | POA: Diagnosis not present

## 2016-05-26 DIAGNOSIS — N2581 Secondary hyperparathyroidism of renal origin: Secondary | ICD-10-CM | POA: Diagnosis not present

## 2016-05-29 DIAGNOSIS — E1029 Type 1 diabetes mellitus with other diabetic kidney complication: Secondary | ICD-10-CM | POA: Diagnosis not present

## 2016-05-29 DIAGNOSIS — N186 End stage renal disease: Secondary | ICD-10-CM | POA: Diagnosis not present

## 2016-05-29 DIAGNOSIS — D631 Anemia in chronic kidney disease: Secondary | ICD-10-CM | POA: Diagnosis not present

## 2016-05-29 DIAGNOSIS — N2581 Secondary hyperparathyroidism of renal origin: Secondary | ICD-10-CM | POA: Diagnosis not present

## 2016-06-02 DIAGNOSIS — N186 End stage renal disease: Secondary | ICD-10-CM | POA: Diagnosis not present

## 2016-06-02 DIAGNOSIS — E1029 Type 1 diabetes mellitus with other diabetic kidney complication: Secondary | ICD-10-CM | POA: Diagnosis not present

## 2016-06-02 DIAGNOSIS — Z992 Dependence on renal dialysis: Secondary | ICD-10-CM | POA: Diagnosis not present

## 2016-06-02 DIAGNOSIS — D631 Anemia in chronic kidney disease: Secondary | ICD-10-CM | POA: Diagnosis not present

## 2016-06-02 DIAGNOSIS — N2581 Secondary hyperparathyroidism of renal origin: Secondary | ICD-10-CM | POA: Diagnosis not present

## 2016-06-02 DIAGNOSIS — E1122 Type 2 diabetes mellitus with diabetic chronic kidney disease: Secondary | ICD-10-CM | POA: Diagnosis not present

## 2016-06-05 DIAGNOSIS — D631 Anemia in chronic kidney disease: Secondary | ICD-10-CM | POA: Diagnosis not present

## 2016-06-05 DIAGNOSIS — N186 End stage renal disease: Secondary | ICD-10-CM | POA: Diagnosis not present

## 2016-06-05 DIAGNOSIS — Z23 Encounter for immunization: Secondary | ICD-10-CM | POA: Diagnosis not present

## 2016-06-05 DIAGNOSIS — N2581 Secondary hyperparathyroidism of renal origin: Secondary | ICD-10-CM | POA: Diagnosis not present

## 2016-06-07 DIAGNOSIS — D631 Anemia in chronic kidney disease: Secondary | ICD-10-CM | POA: Diagnosis not present

## 2016-06-07 DIAGNOSIS — Z23 Encounter for immunization: Secondary | ICD-10-CM | POA: Diagnosis not present

## 2016-06-07 DIAGNOSIS — N2581 Secondary hyperparathyroidism of renal origin: Secondary | ICD-10-CM | POA: Diagnosis not present

## 2016-06-07 DIAGNOSIS — N186 End stage renal disease: Secondary | ICD-10-CM | POA: Diagnosis not present

## 2016-06-12 DIAGNOSIS — D631 Anemia in chronic kidney disease: Secondary | ICD-10-CM | POA: Diagnosis not present

## 2016-06-12 DIAGNOSIS — N2581 Secondary hyperparathyroidism of renal origin: Secondary | ICD-10-CM | POA: Diagnosis not present

## 2016-06-12 DIAGNOSIS — Z23 Encounter for immunization: Secondary | ICD-10-CM | POA: Diagnosis not present

## 2016-06-12 DIAGNOSIS — N186 End stage renal disease: Secondary | ICD-10-CM | POA: Diagnosis not present

## 2016-06-14 DIAGNOSIS — Z23 Encounter for immunization: Secondary | ICD-10-CM | POA: Diagnosis not present

## 2016-06-14 DIAGNOSIS — D631 Anemia in chronic kidney disease: Secondary | ICD-10-CM | POA: Diagnosis not present

## 2016-06-14 DIAGNOSIS — N2581 Secondary hyperparathyroidism of renal origin: Secondary | ICD-10-CM | POA: Diagnosis not present

## 2016-06-14 DIAGNOSIS — N186 End stage renal disease: Secondary | ICD-10-CM | POA: Diagnosis not present

## 2016-06-16 DIAGNOSIS — N2581 Secondary hyperparathyroidism of renal origin: Secondary | ICD-10-CM | POA: Diagnosis not present

## 2016-06-16 DIAGNOSIS — Z23 Encounter for immunization: Secondary | ICD-10-CM | POA: Diagnosis not present

## 2016-06-16 DIAGNOSIS — N186 End stage renal disease: Secondary | ICD-10-CM | POA: Diagnosis not present

## 2016-06-16 DIAGNOSIS — D631 Anemia in chronic kidney disease: Secondary | ICD-10-CM | POA: Diagnosis not present

## 2016-06-19 DIAGNOSIS — Z23 Encounter for immunization: Secondary | ICD-10-CM | POA: Diagnosis not present

## 2016-06-19 DIAGNOSIS — N2581 Secondary hyperparathyroidism of renal origin: Secondary | ICD-10-CM | POA: Diagnosis not present

## 2016-06-19 DIAGNOSIS — D631 Anemia in chronic kidney disease: Secondary | ICD-10-CM | POA: Diagnosis not present

## 2016-06-19 DIAGNOSIS — N186 End stage renal disease: Secondary | ICD-10-CM | POA: Diagnosis not present

## 2016-06-21 DIAGNOSIS — D631 Anemia in chronic kidney disease: Secondary | ICD-10-CM | POA: Diagnosis not present

## 2016-06-21 DIAGNOSIS — N2581 Secondary hyperparathyroidism of renal origin: Secondary | ICD-10-CM | POA: Diagnosis not present

## 2016-06-21 DIAGNOSIS — Z23 Encounter for immunization: Secondary | ICD-10-CM | POA: Diagnosis not present

## 2016-06-21 DIAGNOSIS — N186 End stage renal disease: Secondary | ICD-10-CM | POA: Diagnosis not present

## 2016-06-23 DIAGNOSIS — N2581 Secondary hyperparathyroidism of renal origin: Secondary | ICD-10-CM | POA: Diagnosis not present

## 2016-06-23 DIAGNOSIS — D631 Anemia in chronic kidney disease: Secondary | ICD-10-CM | POA: Diagnosis not present

## 2016-06-23 DIAGNOSIS — Z23 Encounter for immunization: Secondary | ICD-10-CM | POA: Diagnosis not present

## 2016-06-23 DIAGNOSIS — N186 End stage renal disease: Secondary | ICD-10-CM | POA: Diagnosis not present

## 2016-06-26 DIAGNOSIS — N186 End stage renal disease: Secondary | ICD-10-CM | POA: Diagnosis not present

## 2016-06-26 DIAGNOSIS — D631 Anemia in chronic kidney disease: Secondary | ICD-10-CM | POA: Diagnosis not present

## 2016-06-26 DIAGNOSIS — Z23 Encounter for immunization: Secondary | ICD-10-CM | POA: Diagnosis not present

## 2016-06-26 DIAGNOSIS — N2581 Secondary hyperparathyroidism of renal origin: Secondary | ICD-10-CM | POA: Diagnosis not present

## 2016-06-28 DIAGNOSIS — E1029 Type 1 diabetes mellitus with other diabetic kidney complication: Secondary | ICD-10-CM | POA: Diagnosis not present

## 2016-06-28 DIAGNOSIS — Z23 Encounter for immunization: Secondary | ICD-10-CM | POA: Diagnosis not present

## 2016-06-28 DIAGNOSIS — N2581 Secondary hyperparathyroidism of renal origin: Secondary | ICD-10-CM | POA: Diagnosis not present

## 2016-06-28 DIAGNOSIS — N186 End stage renal disease: Secondary | ICD-10-CM | POA: Diagnosis not present

## 2016-06-28 DIAGNOSIS — D631 Anemia in chronic kidney disease: Secondary | ICD-10-CM | POA: Diagnosis not present

## 2016-06-30 DIAGNOSIS — N186 End stage renal disease: Secondary | ICD-10-CM | POA: Diagnosis not present

## 2016-06-30 DIAGNOSIS — Z23 Encounter for immunization: Secondary | ICD-10-CM | POA: Diagnosis not present

## 2016-06-30 DIAGNOSIS — N2581 Secondary hyperparathyroidism of renal origin: Secondary | ICD-10-CM | POA: Diagnosis not present

## 2016-06-30 DIAGNOSIS — D631 Anemia in chronic kidney disease: Secondary | ICD-10-CM | POA: Diagnosis not present

## 2016-07-03 DIAGNOSIS — Z992 Dependence on renal dialysis: Secondary | ICD-10-CM | POA: Diagnosis not present

## 2016-07-03 DIAGNOSIS — D631 Anemia in chronic kidney disease: Secondary | ICD-10-CM | POA: Diagnosis not present

## 2016-07-03 DIAGNOSIS — N186 End stage renal disease: Secondary | ICD-10-CM | POA: Diagnosis not present

## 2016-07-03 DIAGNOSIS — N2581 Secondary hyperparathyroidism of renal origin: Secondary | ICD-10-CM | POA: Diagnosis not present

## 2016-07-03 DIAGNOSIS — Z23 Encounter for immunization: Secondary | ICD-10-CM | POA: Diagnosis not present

## 2016-07-03 DIAGNOSIS — E1122 Type 2 diabetes mellitus with diabetic chronic kidney disease: Secondary | ICD-10-CM | POA: Diagnosis not present

## 2016-07-05 DIAGNOSIS — D631 Anemia in chronic kidney disease: Secondary | ICD-10-CM | POA: Diagnosis not present

## 2016-07-05 DIAGNOSIS — E1029 Type 1 diabetes mellitus with other diabetic kidney complication: Secondary | ICD-10-CM | POA: Diagnosis not present

## 2016-07-05 DIAGNOSIS — N186 End stage renal disease: Secondary | ICD-10-CM | POA: Diagnosis not present

## 2016-07-05 DIAGNOSIS — I5031 Acute diastolic (congestive) heart failure: Secondary | ICD-10-CM | POA: Diagnosis not present

## 2016-07-05 DIAGNOSIS — N2581 Secondary hyperparathyroidism of renal origin: Secondary | ICD-10-CM | POA: Diagnosis not present

## 2016-07-07 DIAGNOSIS — D631 Anemia in chronic kidney disease: Secondary | ICD-10-CM | POA: Diagnosis not present

## 2016-07-07 DIAGNOSIS — N2581 Secondary hyperparathyroidism of renal origin: Secondary | ICD-10-CM | POA: Diagnosis not present

## 2016-07-07 DIAGNOSIS — E1029 Type 1 diabetes mellitus with other diabetic kidney complication: Secondary | ICD-10-CM | POA: Diagnosis not present

## 2016-07-07 DIAGNOSIS — I5031 Acute diastolic (congestive) heart failure: Secondary | ICD-10-CM | POA: Diagnosis not present

## 2016-07-07 DIAGNOSIS — N186 End stage renal disease: Secondary | ICD-10-CM | POA: Diagnosis not present

## 2016-07-10 DIAGNOSIS — I5031 Acute diastolic (congestive) heart failure: Secondary | ICD-10-CM | POA: Diagnosis not present

## 2016-07-10 DIAGNOSIS — D631 Anemia in chronic kidney disease: Secondary | ICD-10-CM | POA: Diagnosis not present

## 2016-07-10 DIAGNOSIS — N2581 Secondary hyperparathyroidism of renal origin: Secondary | ICD-10-CM | POA: Diagnosis not present

## 2016-07-10 DIAGNOSIS — N186 End stage renal disease: Secondary | ICD-10-CM | POA: Diagnosis not present

## 2016-07-10 DIAGNOSIS — E1029 Type 1 diabetes mellitus with other diabetic kidney complication: Secondary | ICD-10-CM | POA: Diagnosis not present

## 2016-07-12 DIAGNOSIS — N2581 Secondary hyperparathyroidism of renal origin: Secondary | ICD-10-CM | POA: Diagnosis not present

## 2016-07-12 DIAGNOSIS — N186 End stage renal disease: Secondary | ICD-10-CM | POA: Diagnosis not present

## 2016-07-12 DIAGNOSIS — E1029 Type 1 diabetes mellitus with other diabetic kidney complication: Secondary | ICD-10-CM | POA: Diagnosis not present

## 2016-07-12 DIAGNOSIS — I5031 Acute diastolic (congestive) heart failure: Secondary | ICD-10-CM | POA: Diagnosis not present

## 2016-07-12 DIAGNOSIS — D631 Anemia in chronic kidney disease: Secondary | ICD-10-CM | POA: Diagnosis not present

## 2016-07-14 DIAGNOSIS — D631 Anemia in chronic kidney disease: Secondary | ICD-10-CM | POA: Diagnosis not present

## 2016-07-14 DIAGNOSIS — E1029 Type 1 diabetes mellitus with other diabetic kidney complication: Secondary | ICD-10-CM | POA: Diagnosis not present

## 2016-07-14 DIAGNOSIS — N2581 Secondary hyperparathyroidism of renal origin: Secondary | ICD-10-CM | POA: Diagnosis not present

## 2016-07-14 DIAGNOSIS — I5031 Acute diastolic (congestive) heart failure: Secondary | ICD-10-CM | POA: Diagnosis not present

## 2016-07-14 DIAGNOSIS — N186 End stage renal disease: Secondary | ICD-10-CM | POA: Diagnosis not present

## 2016-07-17 DIAGNOSIS — N2581 Secondary hyperparathyroidism of renal origin: Secondary | ICD-10-CM | POA: Diagnosis not present

## 2016-07-17 DIAGNOSIS — I5031 Acute diastolic (congestive) heart failure: Secondary | ICD-10-CM | POA: Diagnosis not present

## 2016-07-17 DIAGNOSIS — E1029 Type 1 diabetes mellitus with other diabetic kidney complication: Secondary | ICD-10-CM | POA: Diagnosis not present

## 2016-07-17 DIAGNOSIS — N186 End stage renal disease: Secondary | ICD-10-CM | POA: Diagnosis not present

## 2016-07-17 DIAGNOSIS — D631 Anemia in chronic kidney disease: Secondary | ICD-10-CM | POA: Diagnosis not present

## 2016-07-19 DIAGNOSIS — I5031 Acute diastolic (congestive) heart failure: Secondary | ICD-10-CM | POA: Diagnosis not present

## 2016-07-19 DIAGNOSIS — N2581 Secondary hyperparathyroidism of renal origin: Secondary | ICD-10-CM | POA: Diagnosis not present

## 2016-07-19 DIAGNOSIS — N186 End stage renal disease: Secondary | ICD-10-CM | POA: Diagnosis not present

## 2016-07-19 DIAGNOSIS — D631 Anemia in chronic kidney disease: Secondary | ICD-10-CM | POA: Diagnosis not present

## 2016-07-19 DIAGNOSIS — E1029 Type 1 diabetes mellitus with other diabetic kidney complication: Secondary | ICD-10-CM | POA: Diagnosis not present

## 2016-07-20 ENCOUNTER — Encounter: Payer: Self-pay | Admitting: Family Medicine

## 2016-07-20 ENCOUNTER — Ambulatory Visit: Payer: Medicare Other | Attending: Family Medicine | Admitting: Family Medicine

## 2016-07-20 VITALS — BP 129/82 | HR 88 | Temp 97.7°F | Ht 71.0 in | Wt 202.0 lb

## 2016-07-20 DIAGNOSIS — E1122 Type 2 diabetes mellitus with diabetic chronic kidney disease: Secondary | ICD-10-CM | POA: Insufficient documentation

## 2016-07-20 DIAGNOSIS — I129 Hypertensive chronic kidney disease with stage 1 through stage 4 chronic kidney disease, or unspecified chronic kidney disease: Secondary | ICD-10-CM | POA: Diagnosis not present

## 2016-07-20 DIAGNOSIS — F329 Major depressive disorder, single episode, unspecified: Secondary | ICD-10-CM | POA: Insufficient documentation

## 2016-07-20 DIAGNOSIS — N189 Chronic kidney disease, unspecified: Secondary | ICD-10-CM | POA: Diagnosis not present

## 2016-07-20 DIAGNOSIS — Z794 Long term (current) use of insulin: Secondary | ICD-10-CM

## 2016-07-20 DIAGNOSIS — M65342 Trigger finger, left ring finger: Secondary | ICD-10-CM | POA: Diagnosis not present

## 2016-07-20 DIAGNOSIS — M545 Low back pain, unspecified: Secondary | ICD-10-CM

## 2016-07-20 DIAGNOSIS — Z992 Dependence on renal dialysis: Secondary | ICD-10-CM | POA: Insufficient documentation

## 2016-07-20 DIAGNOSIS — Z96651 Presence of right artificial knee joint: Secondary | ICD-10-CM | POA: Diagnosis not present

## 2016-07-20 DIAGNOSIS — M25561 Pain in right knee: Secondary | ICD-10-CM | POA: Diagnosis not present

## 2016-07-20 DIAGNOSIS — E1129 Type 2 diabetes mellitus with other diabetic kidney complication: Secondary | ICD-10-CM

## 2016-07-20 DIAGNOSIS — G8929 Other chronic pain: Secondary | ICD-10-CM

## 2016-07-20 DIAGNOSIS — Z7982 Long term (current) use of aspirin: Secondary | ICD-10-CM | POA: Diagnosis not present

## 2016-07-20 LAB — GLUCOSE, POCT (MANUAL RESULT ENTRY): POC Glucose: 134 mg/dl — AB (ref 70–99)

## 2016-07-20 LAB — POCT GLYCOSYLATED HEMOGLOBIN (HGB A1C): Hemoglobin A1C: 7.5

## 2016-07-20 MED ORDER — DICLOFENAC SODIUM 1 % TD GEL: 4.0000 g | Freq: Four times a day (QID) | TRANSDERMAL | 5 refills | Status: DC

## 2016-07-20 MED FILL — VOLTAREN 1% GEL: 1 | 6 days supply | Qty: 100 | Fill #0

## 2016-07-20 NOTE — Assessment & Plan Note (Signed)
Back brace prescribed for back pain

## 2016-07-20 NOTE — Assessment & Plan Note (Signed)
Pain in prosthetic knee Ortho referral placed

## 2016-07-20 NOTE — Assessment & Plan Note (Signed)
Trigger finger Voltaren gel Splint

## 2016-07-20 NOTE — Progress Notes (Signed)
Subjective:  Patient ID: [name], male    DOB: [birth year]  Age: 55 y.o. MRN: [id number]  CC: Diabetes   HPI [name] has CKD, HTN, DM, depression he presents for   1. CHRONIC DIABETES  Disease Monitoring  Blood Sugar Ranges: not checking at home   Polyuria: no   Visual problems: no   Medication Compliance: no medications. Amaryl was discontinued at dialysis. Medication Side Effects  Hypoglycemia: no   Preventitive Health Care  Eye Exam:   Foot Exam: done today    2. L ring finger stiffness: for past 2 weeks. No injury. No redness or swelling. There is pain in joint and palm. No treatment.   3. R knee pain: s/p R knee replacement. Worsening pain for past 3 months. Pain when stepping up stairs.   Social History  Substance Use Topics  . Smoking status: Never Smoker  . Smokeless tobacco: Never Used  . Alcohol use No     Comment: once in a while per patient "beer/brandy"    Outpatient Medications Prior to Visit  Medication Sig Dispense Refill  . acetaminophen-codeine (TYLENOL #3) 300-30 MG tablet Take 1 tablet by mouth at bedtime as needed for moderate pain. 30 tablet 0  . aspirin 325 MG tablet Take 325 mg by mouth daily. Reported on 12/21/2015    . cinacalcet (SENSIPAR) 30 MG tablet Take 30 mg by mouth every other day. Reported on 12/21/2015    . lanthanum (FOSRENOL) 1000 MG chewable tablet Chew 1,000 mg by mouth 3 (three) times daily with meals. Reported on 12/21/2015    . losartan (COZAAR) 25 MG tablet Take 25 mg by mouth daily. Reported on 12/21/2015    . sevelamer carbonate (RENVELA) 800 MG tablet Take 800 mg by mouth 3 (three) times daily with meals. Reported on 12/21/2015    . aspirin 81 MG chewable tablet Chew 1 tablet (81 mg total) by mouth once. (Patient not taking: Reported on 07/20/2016) 30 tablet 5  . calcium acetate (PHOSLO) 667 MG capsule Take 2 capsules (1,334 mg total) by mouth 3 (three) times daily with meals. (Patient not taking:  Reported on 07/20/2016) 180 capsule 0  . carvedilol (COREG) 25 MG tablet Take 25 mg by mouth 2 (two) times daily with a meal. Reported on 12/21/2015    . diltiazem (CARDIZEM CD) 120 MG 24 hr capsule Take 1 capsule (120 mg total) by mouth daily. (Patient not taking: Reported on 07/20/2016) 30 capsule 5  . famotidine (PEPCID) 20 MG tablet Take 40 mg by mouth 2 (two) times daily. Reported on 12/21/2015    . glimepiride (AMARYL) 4 MG tablet Take 4 mg by mouth daily with breakfast. Reported on 12/21/2015    . mirtazapine (REMERON) 30 MG tablet Take 30 mg by mouth at bedtime. Reported on 12/21/2015    . montelukast (SINGULAIR) 10 MG tablet Take 10 mg by mouth at bedtime. Reported on 12/21/2015    . nitroGLYCERIN (NITROSTAT) 0.4 MG SL tablet Place 1 tablet (0.4 mg total) under the tongue every 5 (five) minutes as needed for chest pain. Do not mix with viagra (Patient not taking: Reported on 07/20/2016) 15 tablet 0  . predniSONE (DELTASONE) 20 MG tablet Take 1 tablet (20 mg total) by mouth daily with breakfast. (Patient not taking: Reported on 07/20/2016) 5 tablet 0  . sertraline (ZOLOFT) 50 MG tablet Take 1 tablet (50 mg total) by mouth daily. (Patient not taking: Reported on 07/20/2016) 30 tablet 3  . sildenafil (  VIAGRA) 50 MG tablet Take 0.5-1 tablets (25-50 mg total) by mouth daily as needed for erectile dysfunction. (Patient not taking: Reported on 07/20/2016) 10 tablet 0   No facility-administered medications prior to visit.     ROS Review of Systems  Constitutional: Negative for chills, fatigue, fever and unexpected weight change.  Eyes: Negative for visual disturbance.  Respiratory: Negative for cough and shortness of breath.   Cardiovascular: Negative for chest pain, palpitations and leg swelling.  Gastrointestinal: Negative for abdominal pain, blood in stool, constipation, diarrhea, nausea and vomiting.  Endocrine: Negative for polydipsia, polyphagia and polyuria.  Musculoskeletal: Positive for  arthralgias (R knee, L 4th finger ) and back pain (low back ). Negative for gait problem, myalgias and neck pain.  Skin: Negative for rash.  Allergic/Immunologic: Negative for immunocompromised state.  Hematological: Negative for adenopathy. Does not bruise/bleed easily.  Psychiatric/Behavioral: Negative for dysphoric mood, sleep disturbance and suicidal ideas. The patient is not nervous/anxious.     Objective:  BP 129/82 (BP Location: Left Arm, Patient Position: Sitting, Cuff Size: Small)   Pulse 88   Temp 97.7 F (36.5 C) (Oral)   Ht 5\' 11"  (1.803 m)   Wt 202 lb (91.6 kg)   SpO2 99%   BMI 28.17 kg/m   BP/Weight 07/20/2016 [id number]  Systolic BP [id number] Q000111Q 98  Diastolic BP 82 84 66  Wt. (Lbs) 202 206 196.2  BMI 28.17 28.74 27.38  Some encounter information is confidential and restricted. Go to Review Flowsheets activity to see all data.    Physical Exam  Constitutional: He appears well-developed and well-nourished. No distress.  HENT:  Head: Normocephalic and atraumatic.  Neck: Normal range of motion. Neck supple.  Cardiovascular: Normal rate, regular rhythm, normal heart sounds and intact distal pulses.   Pulmonary/Chest: Effort normal and breath sounds normal.  Musculoskeletal: He exhibits no edema.       Right knee: Normal.       Hands: Neurological: He is alert.  Skin: Skin is warm and dry. No rash noted. No erythema.  Psychiatric: He has a normal mood and affect.   Lab Results  Component Value Date   HGBA1C 9.5 12/21/2015   CBG 134  Assessment & Plan:   [name] was seen today for diabetes.  Diagnoses and all orders for this visit:  Controlled type 2 diabetes mellitus with other diabetic kidney complication, with long-term current use of insulin (HCC) -     POCT glucose (manual entry) -     POCT glycosylated hemoglobin (Hb A1C)  Mechanical knee pain, right -     Ambulatory referral to Orthopedic Surgery  Trigger finger, left ring finger -      diclofenac sodium (VOLTAREN) 1 % GEL; Apply 4 g topically 4 (four) times daily.  Chronic bilateral low back pain without sciatica   No orders of the defined types were placed in this encounter.   Follow-up: Return in about 3 months (around 10/20/2016) for diabetes .   [name] MD

## 2016-07-20 NOTE — Assessment & Plan Note (Signed)
Diabetes type 2 patient on dialysis no medications Continue diet controlled

## 2016-07-20 NOTE — Patient Instructions (Addendum)
[name] was seen today for diabetes.  Diagnoses and all orders for this visit:  Controlled type 2 diabetes mellitus with other diabetic kidney complication, with long-term current use of insulin (HCC) -     POCT glucose (manual entry) -     POCT glycosylated hemoglobin (Hb A1C)  Mechanical knee pain, right -     Ambulatory referral to Orthopedic Surgery  Trigger finger, left ring finger -     diclofenac sodium (VOLTAREN) 1 % GEL; Apply 4 g topically 4 (four) times daily.    F/u in 3 months for diabetes   Dr. Adrian Blackwater   Trigger Finger Trigger finger (digital tendinitis and stenosing tenosynovitis) is a common disorder that causes an often painful catching of the fingers or thumb. It occurs as a clicking, snapping, or locking of a finger in the palm of the hand. This is caused by a problem with the tendons that flex or bend the fingers sliding smoothly through their sheaths. The condition may occur in any finger or a couple fingers at the same time.  The finger may lock with the finger curled or suddenly straighten out with a snap. This is more common in patients with rheumatoid arthritis and diabetes. Left untreated, the condition may get worse to the point where the finger becomes locked in flexion, like making a fist, or less commonly locked with the finger straightened out. CAUSES   Inflammation and scarring that lead to swelling around the tendon sheath.  Repeated or forceful movements.  Rheumatoid arthritis, an autoimmune disease that affects joints.  Gout.  Diabetes mellitus. SIGNS AND SYMPTOMS  Soreness and swelling of your finger.  A painful clicking or snapping as you bend and straighten your finger. DIAGNOSIS  Your health care provider will do a physical exam of your finger to diagnose trigger finger. TREATMENT   Splinting for 6-8 weeks may be helpful.  Nonsteroidal anti-inflammatory medicines (NSAIDs) can help to relieve the pain and inflammation.  Cortisone  injections, along with splinting, may speed up recovery. Several injections may be required. Cortisone may give relief after one injection.  Surgery is another treatment that may be used if conservative treatments do not work. Surgery can be minor, without incisions (a cut does not have to be made), and can be done with a needle through the skin.  Other surgical choices involve an open procedure in which the surgeon opens the hand through a small incision and cuts the pulley so the tendon can again slide smoothly. Your hand will still work fine. HOME CARE INSTRUCTIONS  Apply ice to the injured area, twice per day:  Put ice in a plastic bag.  Place a towel between your skin and the bag.  Leave the ice on for 20 minutes, 3-4 times a day.  Rest your hand often. MAKE SURE YOU:   Understand these instructions.  Will watch your condition.  Will get help right away if you are not doing well or get worse. This information is not intended to replace advice given to you by your health care provider. Make sure you discuss any questions you have with your health care provider. Document Released: 06/09/2004 Document Revised: 04/22/2013 Document Reviewed: 01/20/2013 Elsevier Interactive Patient Education  2017 [employer].

## 2016-07-20 NOTE — Progress Notes (Signed)
Pt is here today to follow up on diabetes. Pt is having trouble with left middle finger.  Pt has flu shot already.

## 2016-07-21 DIAGNOSIS — D631 Anemia in chronic kidney disease: Secondary | ICD-10-CM | POA: Diagnosis not present

## 2016-07-21 DIAGNOSIS — N186 End stage renal disease: Secondary | ICD-10-CM | POA: Diagnosis not present

## 2016-07-21 DIAGNOSIS — E1029 Type 1 diabetes mellitus with other diabetic kidney complication: Secondary | ICD-10-CM | POA: Diagnosis not present

## 2016-07-21 DIAGNOSIS — N2581 Secondary hyperparathyroidism of renal origin: Secondary | ICD-10-CM | POA: Diagnosis not present

## 2016-07-21 DIAGNOSIS — I5031 Acute diastolic (congestive) heart failure: Secondary | ICD-10-CM | POA: Diagnosis not present

## 2016-07-23 DIAGNOSIS — I5031 Acute diastolic (congestive) heart failure: Secondary | ICD-10-CM | POA: Diagnosis not present

## 2016-07-23 DIAGNOSIS — E1029 Type 1 diabetes mellitus with other diabetic kidney complication: Secondary | ICD-10-CM | POA: Diagnosis not present

## 2016-07-23 DIAGNOSIS — N2581 Secondary hyperparathyroidism of renal origin: Secondary | ICD-10-CM | POA: Diagnosis not present

## 2016-07-23 DIAGNOSIS — N186 End stage renal disease: Secondary | ICD-10-CM | POA: Diagnosis not present

## 2016-07-23 DIAGNOSIS — D631 Anemia in chronic kidney disease: Secondary | ICD-10-CM | POA: Diagnosis not present

## 2016-07-24 DIAGNOSIS — N186 End stage renal disease: Secondary | ICD-10-CM | POA: Diagnosis not present

## 2016-07-24 DIAGNOSIS — Z992 Dependence on renal dialysis: Secondary | ICD-10-CM | POA: Diagnosis not present

## 2016-07-24 DIAGNOSIS — I871 Compression of vein: Secondary | ICD-10-CM | POA: Diagnosis not present

## 2016-07-24 DIAGNOSIS — T82858A Stenosis of vascular prosthetic devices, implants and grafts, initial encounter: Secondary | ICD-10-CM | POA: Diagnosis not present

## 2016-07-25 ENCOUNTER — Ambulatory Visit (INDEPENDENT_AMBULATORY_CARE_PROVIDER_SITE_OTHER): Payer: Medicare Other | Admitting: Podiatry

## 2016-07-25 DIAGNOSIS — L608 Other nail disorders: Secondary | ICD-10-CM

## 2016-07-25 DIAGNOSIS — E1151 Type 2 diabetes mellitus with diabetic peripheral angiopathy without gangrene: Secondary | ICD-10-CM

## 2016-07-25 NOTE — Patient Instructions (Signed)

## 2016-07-26 NOTE — Progress Notes (Signed)
Patient ID: [name], male   DOB: 11-22-1960, 55 y.o.   MRN: [id number]    Subjective: This patient presents complaining of elongated toenails when walking and wearing shoes and request toenail debridement  Objective:  patient appears pleasant orientated 3  Vascular: No peripheral edema noted bilaterally DP and PT pulses 0/4 bilaterally Capillary reflex within normal limits bilaterally  Neurological: Sensation to 10 g monofilament wire intact 4/5 bilaterally Vibratory sensation nonreactive right reactive left Ankle reflex were equal and reactive bilaterally  Dermatological: No open skin lesions bilaterally Dry atrophic skin bilaterally without hair growth The toenails are elongated, incurvated with occasional texture and color changes  Musculoskeletal: Amputation distal left hallux with well-healed incision Rigid hammertoe second left without any signs of irritation on the second left toe There is no restriction ankle, subtalar, midtarsal joints  Assessment: Incurvated toenails 6-10 Decrease pedal pulses suggestive of possible peripheral arterial disease Diabetic peripheral neuropathy Hammertoe second left  Plan: Debridement toenails 6-10 mechanically and electrically without any bleeding  Reappoint 3 months

## 2016-07-27 DIAGNOSIS — I5031 Acute diastolic (congestive) heart failure: Secondary | ICD-10-CM | POA: Diagnosis not present

## 2016-07-27 DIAGNOSIS — E1029 Type 1 diabetes mellitus with other diabetic kidney complication: Secondary | ICD-10-CM | POA: Diagnosis not present

## 2016-07-27 DIAGNOSIS — D631 Anemia in chronic kidney disease: Secondary | ICD-10-CM | POA: Diagnosis not present

## 2016-07-27 DIAGNOSIS — N186 End stage renal disease: Secondary | ICD-10-CM | POA: Diagnosis not present

## 2016-07-27 DIAGNOSIS — N2581 Secondary hyperparathyroidism of renal origin: Secondary | ICD-10-CM | POA: Diagnosis not present

## 2016-07-30 ENCOUNTER — Telehealth: Payer: Self-pay | Admitting: Family Medicine

## 2016-07-30 ENCOUNTER — Ambulatory Visit (INDEPENDENT_AMBULATORY_CARE_PROVIDER_SITE_OTHER): Payer: Medicare Other | Admitting: Orthopaedic Surgery

## 2016-07-30 DIAGNOSIS — Z0000 Encounter for general adult medical examination without abnormal findings: Secondary | ICD-10-CM

## 2016-07-30 NOTE — Telephone Encounter (Signed)
Pt requesting a referral for a colonoscopy

## 2016-07-31 DIAGNOSIS — N2581 Secondary hyperparathyroidism of renal origin: Secondary | ICD-10-CM | POA: Diagnosis not present

## 2016-07-31 DIAGNOSIS — D631 Anemia in chronic kidney disease: Secondary | ICD-10-CM | POA: Diagnosis not present

## 2016-07-31 DIAGNOSIS — I5031 Acute diastolic (congestive) heart failure: Secondary | ICD-10-CM | POA: Diagnosis not present

## 2016-07-31 DIAGNOSIS — N186 End stage renal disease: Secondary | ICD-10-CM | POA: Diagnosis not present

## 2016-07-31 DIAGNOSIS — E1029 Type 1 diabetes mellitus with other diabetic kidney complication: Secondary | ICD-10-CM | POA: Diagnosis not present

## 2016-08-01 NOTE — Telephone Encounter (Signed)
Will route to PCP 

## 2016-08-02 DIAGNOSIS — Z992 Dependence on renal dialysis: Secondary | ICD-10-CM | POA: Diagnosis not present

## 2016-08-02 DIAGNOSIS — D631 Anemia in chronic kidney disease: Secondary | ICD-10-CM | POA: Diagnosis not present

## 2016-08-02 DIAGNOSIS — N186 End stage renal disease: Secondary | ICD-10-CM | POA: Diagnosis not present

## 2016-08-02 DIAGNOSIS — E1029 Type 1 diabetes mellitus with other diabetic kidney complication: Secondary | ICD-10-CM | POA: Diagnosis not present

## 2016-08-02 DIAGNOSIS — N2581 Secondary hyperparathyroidism of renal origin: Secondary | ICD-10-CM | POA: Diagnosis not present

## 2016-08-02 DIAGNOSIS — E1122 Type 2 diabetes mellitus with diabetic chronic kidney disease: Secondary | ICD-10-CM | POA: Diagnosis not present

## 2016-08-02 DIAGNOSIS — I5031 Acute diastolic (congestive) heart failure: Secondary | ICD-10-CM | POA: Diagnosis not present

## 2016-08-02 NOTE — Telephone Encounter (Signed)
GI referral placed

## 2016-08-04 DIAGNOSIS — N186 End stage renal disease: Secondary | ICD-10-CM | POA: Diagnosis not present

## 2016-08-04 DIAGNOSIS — N2581 Secondary hyperparathyroidism of renal origin: Secondary | ICD-10-CM | POA: Diagnosis not present

## 2016-08-04 DIAGNOSIS — E1029 Type 1 diabetes mellitus with other diabetic kidney complication: Secondary | ICD-10-CM | POA: Diagnosis not present

## 2016-08-04 DIAGNOSIS — D631 Anemia in chronic kidney disease: Secondary | ICD-10-CM | POA: Diagnosis not present

## 2016-08-04 DIAGNOSIS — I5031 Acute diastolic (congestive) heart failure: Secondary | ICD-10-CM | POA: Diagnosis not present

## 2016-08-07 DIAGNOSIS — E1029 Type 1 diabetes mellitus with other diabetic kidney complication: Secondary | ICD-10-CM | POA: Diagnosis not present

## 2016-08-07 DIAGNOSIS — I5031 Acute diastolic (congestive) heart failure: Secondary | ICD-10-CM | POA: Diagnosis not present

## 2016-08-07 DIAGNOSIS — D631 Anemia in chronic kidney disease: Secondary | ICD-10-CM | POA: Diagnosis not present

## 2016-08-07 DIAGNOSIS — N186 End stage renal disease: Secondary | ICD-10-CM | POA: Diagnosis not present

## 2016-08-07 DIAGNOSIS — N2581 Secondary hyperparathyroidism of renal origin: Secondary | ICD-10-CM | POA: Diagnosis not present

## 2016-08-08 ENCOUNTER — Ambulatory Visit (INDEPENDENT_AMBULATORY_CARE_PROVIDER_SITE_OTHER): Payer: Medicare Other

## 2016-08-08 ENCOUNTER — Encounter (INDEPENDENT_AMBULATORY_CARE_PROVIDER_SITE_OTHER): Payer: Self-pay | Admitting: Orthopedic Surgery

## 2016-08-08 ENCOUNTER — Encounter: Payer: Self-pay | Admitting: Vascular Surgery

## 2016-08-08 ENCOUNTER — Ambulatory Visit (INDEPENDENT_AMBULATORY_CARE_PROVIDER_SITE_OTHER): Payer: Medicare Other | Admitting: Orthopedic Surgery

## 2016-08-08 DIAGNOSIS — M25561 Pain in right knee: Secondary | ICD-10-CM

## 2016-08-08 DIAGNOSIS — G8929 Other chronic pain: Secondary | ICD-10-CM | POA: Diagnosis not present

## 2016-08-08 DIAGNOSIS — M65342 Trigger finger, left ring finger: Secondary | ICD-10-CM | POA: Diagnosis not present

## 2016-08-08 DIAGNOSIS — M79661 Pain in right lower leg: Secondary | ICD-10-CM

## 2016-08-08 MED ORDER — BUPIVACAINE HCL 0.25 % IJ SOLN
0.3300 mL | INTRAMUSCULAR | Status: AC | PRN
  Administered 2016-08-08: .33 mL

## 2016-08-08 MED ORDER — LIDOCAINE HCL 1 % IJ SOLN
1.0000 mL | INTRAMUSCULAR | Status: AC | PRN
  Administered 2016-08-08: 1 mL

## 2016-08-08 MED ORDER — METHYLPREDNISOLONE ACETATE 40 MG/ML IJ SUSP
13.3300 mg | INTRAMUSCULAR | Status: AC | PRN
  Administered 2016-08-08: 13.33 mg
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
## 2016-08-09 DIAGNOSIS — N2581 Secondary hyperparathyroidism of renal origin: Secondary | ICD-10-CM | POA: Diagnosis not present

## 2016-08-09 DIAGNOSIS — E1029 Type 1 diabetes mellitus with other diabetic kidney complication: Secondary | ICD-10-CM | POA: Diagnosis not present

## 2016-08-09 DIAGNOSIS — N186 End stage renal disease: Secondary | ICD-10-CM | POA: Diagnosis not present

## 2016-08-09 DIAGNOSIS — D631 Anemia in chronic kidney disease: Secondary | ICD-10-CM | POA: Diagnosis not present

## 2016-08-09 DIAGNOSIS — I5031 Acute diastolic (congestive) heart failure: Secondary | ICD-10-CM | POA: Diagnosis not present

## 2016-08-13 DIAGNOSIS — N186 End stage renal disease: Secondary | ICD-10-CM | POA: Diagnosis not present

## 2016-08-13 DIAGNOSIS — D631 Anemia in chronic kidney disease: Secondary | ICD-10-CM | POA: Diagnosis not present

## 2016-08-13 DIAGNOSIS — Z992 Dependence on renal dialysis: Secondary | ICD-10-CM | POA: Diagnosis not present

## 2016-08-13 DIAGNOSIS — A09 Infectious gastroenteritis and colitis, unspecified: Secondary | ICD-10-CM | POA: Diagnosis not present

## 2016-08-14 DIAGNOSIS — N2581 Secondary hyperparathyroidism of renal origin: Secondary | ICD-10-CM | POA: Diagnosis not present

## 2016-08-14 DIAGNOSIS — N186 End stage renal disease: Secondary | ICD-10-CM | POA: Diagnosis not present

## 2016-08-14 DIAGNOSIS — E1029 Type 1 diabetes mellitus with other diabetic kidney complication: Secondary | ICD-10-CM | POA: Diagnosis not present

## 2016-08-14 DIAGNOSIS — D631 Anemia in chronic kidney disease: Secondary | ICD-10-CM | POA: Diagnosis not present

## 2016-08-14 DIAGNOSIS — I5031 Acute diastolic (congestive) heart failure: Secondary | ICD-10-CM | POA: Diagnosis not present

## 2016-08-16 ENCOUNTER — Encounter: Payer: Self-pay | Admitting: Vascular Surgery

## 2016-08-16 DIAGNOSIS — N186 End stage renal disease: Secondary | ICD-10-CM | POA: Diagnosis not present

## 2016-08-16 DIAGNOSIS — I5031 Acute diastolic (congestive) heart failure: Secondary | ICD-10-CM | POA: Diagnosis not present

## 2016-08-16 DIAGNOSIS — N2581 Secondary hyperparathyroidism of renal origin: Secondary | ICD-10-CM | POA: Diagnosis not present

## 2016-08-16 DIAGNOSIS — E1029 Type 1 diabetes mellitus with other diabetic kidney complication: Secondary | ICD-10-CM | POA: Diagnosis not present

## 2016-08-16 DIAGNOSIS — D631 Anemia in chronic kidney disease: Secondary | ICD-10-CM | POA: Diagnosis not present

## 2016-08-18 DIAGNOSIS — D631 Anemia in chronic kidney disease: Secondary | ICD-10-CM | POA: Diagnosis not present

## 2016-08-18 DIAGNOSIS — N2581 Secondary hyperparathyroidism of renal origin: Secondary | ICD-10-CM | POA: Diagnosis not present

## 2016-08-18 DIAGNOSIS — N186 End stage renal disease: Secondary | ICD-10-CM | POA: Diagnosis not present

## 2016-08-18 DIAGNOSIS — E1029 Type 1 diabetes mellitus with other diabetic kidney complication: Secondary | ICD-10-CM | POA: Diagnosis not present

## 2016-08-18 DIAGNOSIS — I5031 Acute diastolic (congestive) heart failure: Secondary | ICD-10-CM | POA: Diagnosis not present

## 2016-08-21 DIAGNOSIS — E1029 Type 1 diabetes mellitus with other diabetic kidney complication: Secondary | ICD-10-CM | POA: Diagnosis not present

## 2016-08-21 DIAGNOSIS — D631 Anemia in chronic kidney disease: Secondary | ICD-10-CM | POA: Diagnosis not present

## 2016-08-21 DIAGNOSIS — N2581 Secondary hyperparathyroidism of renal origin: Secondary | ICD-10-CM | POA: Diagnosis not present

## 2016-08-21 DIAGNOSIS — N186 End stage renal disease: Secondary | ICD-10-CM | POA: Diagnosis not present

## 2016-08-21 DIAGNOSIS — I5031 Acute diastolic (congestive) heart failure: Secondary | ICD-10-CM | POA: Diagnosis not present

## 2016-08-22 ENCOUNTER — Ambulatory Visit (INDEPENDENT_AMBULATORY_CARE_PROVIDER_SITE_OTHER): Payer: Medicare Other | Admitting: Orthopedic Surgery

## 2016-08-22 ENCOUNTER — Encounter (INDEPENDENT_AMBULATORY_CARE_PROVIDER_SITE_OTHER): Payer: Self-pay | Admitting: Orthopedic Surgery

## 2016-08-22 DIAGNOSIS — M1711 Unilateral primary osteoarthritis, right knee: Secondary | ICD-10-CM | POA: Diagnosis not present

## 2016-08-22 MED ORDER — LIDOCAINE HCL 1 % IJ SOLN
5.0000 mL | INTRAMUSCULAR | Status: AC | PRN
  Administered 2016-08-22: 5 mL

## 2016-08-22 MED ORDER — HYLAN G-F 20 48 MG/6ML IX SOSY
48.0000 mg | PREFILLED_SYRINGE | INTRA_ARTICULAR | Status: AC | PRN
  Administered 2016-08-22: 48 mg via INTRA_ARTICULAR

## 2016-08-22 NOTE — Progress Notes (Signed)
Office Visit Note   Patient: [name]           Date of Birth: Aug 23, 1961           MRN: [id number] Visit Date: 08/22/2016 Requested by: [name], MD [address] [place], [place] [address] PCP: Minerva Ends, MD  Subjective: Chief Complaint  Patient presents with  . Right Knee - Follow-up, Pain    HPIpatient presents for right knee Synvisc injection              Review of Systems All systems reviewed are negative as they relate to the chief complaint within the history of present illness.  Patient denies  fevers or chills.    Assessment & Plan: Visit Diagnoses:  1. Primary osteoarthritis of right knee     Plan: Patient tolerated the injection well we will see him back as needed.  (Trigger finger injection is doing very well.  Follow-Up Instructions: No Follow-up on file.   Orders:  No orders of the defined types were placed in this encounter.  No orders of the defined types were placed in this encounter.     Procedures: Large Joint Inj Date/Time: 08/22/2016 6:14 PM Performed by: Meredith Pel Authorized by: Meredith Pel   Consent Given by:  Patient Site marked: the procedure site was marked   Timeout: prior to procedure the correct patient, procedure, and site was verified   Indications:  Pain, joint swelling and diagnostic evaluation Location:  Knee Site:  R knee Prep: patient was prepped and draped in usual sterile fashion   Needle Size:  18 G Needle Length:  1.5 inches Approach:  Superolateral Ultrasound Guidance: No   Fluoroscopic Guidance: No   Arthrogram: No   Medications:  5 mL lidocaine 1 %; 48 mg Hylan 48 MG/6ML Patient tolerance:  Patient tolerated the procedure well with no immediate complications     Clinical Data: No additional findings.  Objective: Vital Signs: There were no vitals taken for this visit.  Physical [place] physical exam of the knee there is no effusion.  Some joint line  tenderness the rest of his exam is unchanged  Ortho Exam   Constitutional: Patient appears well-developed HEENT:  Head: Normocephalic Eyes:EOM are normal Neck: Normal range of motion Cardiovascular: Normal rate Pulmonary/chest: Effort normal Neurologic: Patient is alert Skin: Skin is warm Psychiatric: Patient has normal mood and affect    Specialty Comments:  No specialty comments available.  Imaging: No results found.   PMFS History: Patient Active Problem List   Diagnosis Date Noted  . Chronic pain of right knee 08/08/2016  . Mechanical knee pain, right 07/20/2016  . Trigger finger, left ring finger 07/20/2016  . Chronic bilateral low back pain without sciatica 07/20/2016  . Chest pain 12/21/2015  . Poor dentition 07/06/2015  . Lateral pain of left hip 07/06/2015  . Erectile dysfunction 07/06/2015  . End stage renal disease on dialysis ([place]) 03/15/2015  . Major depressive disorder, single episode, moderate ([place]) 02/09/2015  . Stimulant use disorder (cocaine) 02/09/2015  . Alcohol use disorder, moderate, dependence ([place]) 02/09/2015  . Chronic diastolic congestive heart failure ([place])   . IgA monoclonal gammopathy 12/28/2014  . GERD (gastroesophageal reflux disease) 11/08/2014  . Type 2 diabetes, controlled, with renal manifestation ([place]) 09/06/2013  . Hypertension 08/27/2011  . Anemia of chronic renal failure, stage 4 (severe) ([place] [place]) 08/27/2011   Past Medical History:  Diagnosis Date  . Anemia   . Cellulitis and abscess  of foot 08/24/2011  . CHF (congestive heart failure) ([place] [facility]) 2015  . Chronic renal disease, stage IV ([place]) 2015  . Cocaine abuse   . Constipation   . Diabetes mellitus 2015   type 2  . Diastolic congestive heart failure ([place])   . GERD (gastroesophageal reflux disease)    unsure  . Headache   . Hypertension 2015  . Multiple myeloma ([place])   . Protein calorie malnutrition ([place])   . PVD (peripheral vascular disease) ([place])   . Shortness of  breath dyspnea     Family History  Problem Relation Age of Onset  . Adopted: Yes  . Varicose Veins Sister     Past Surgical History:  Procedure Laterality Date  . AMPUTATION  09/06/2011   Procedure: AMPUTATION RAY;  Surgeon: Wylene Simmer, MD;  Location: [facility];  Service: Orthopedics;  Laterality: Left;  Left Hallux Amputation  . APPENDECTOMY    . AV FISTULA PLACEMENT Left 08/12/2014   Procedure: CREATION OF A BRACHIOCEPHALIC ARTERIOVENOUS (AV) FISTULA  LEFT ARM;  Surgeon: Mal Misty, MD;  Location: [facility];  Service: Vascular;  Laterality: Left;  . AV FISTULA PLACEMENT Right 10/07/2014   Procedure: Creation Right Arm Arteriovenous fistula;  Surgeon: Mal Misty, MD;  Location: [facility];  Service: Vascular;  Laterality: Right;  . KNEE SURGERY Right    "metal plate and screws" per patient   . LEG SURGERY     Social History   Occupational History  . Not on file.   Social History Main Topics  . Smoking status: Never Smoker  . Smokeless tobacco: Never Used  . Alcohol use No     Comment: once in a while per patient "beer/brandy"  . Drug use: No     Comment: per pt* smoked marijuana as a teenager and last cocaine use was two months ago  . Sexual activity: No

## 2016-08-23 DIAGNOSIS — N186 End stage renal disease: Secondary | ICD-10-CM | POA: Diagnosis not present

## 2016-08-23 DIAGNOSIS — D631 Anemia in chronic kidney disease: Secondary | ICD-10-CM | POA: Diagnosis not present

## 2016-08-23 DIAGNOSIS — N2581 Secondary hyperparathyroidism of renal origin: Secondary | ICD-10-CM | POA: Diagnosis not present

## 2016-08-23 DIAGNOSIS — E1029 Type 1 diabetes mellitus with other diabetic kidney complication: Secondary | ICD-10-CM | POA: Diagnosis not present

## 2016-08-23 DIAGNOSIS — I5031 Acute diastolic (congestive) heart failure: Secondary | ICD-10-CM | POA: Diagnosis not present

## 2016-08-28 DIAGNOSIS — E1029 Type 1 diabetes mellitus with other diabetic kidney complication: Secondary | ICD-10-CM | POA: Diagnosis not present

## 2016-08-28 DIAGNOSIS — N186 End stage renal disease: Secondary | ICD-10-CM | POA: Diagnosis not present

## 2016-08-28 DIAGNOSIS — D631 Anemia in chronic kidney disease: Secondary | ICD-10-CM | POA: Diagnosis not present

## 2016-08-28 DIAGNOSIS — N2581 Secondary hyperparathyroidism of renal origin: Secondary | ICD-10-CM | POA: Diagnosis not present

## 2016-08-28 DIAGNOSIS — I5031 Acute diastolic (congestive) heart failure: Secondary | ICD-10-CM | POA: Diagnosis not present

## 2016-08-30 DIAGNOSIS — E1029 Type 1 diabetes mellitus with other diabetic kidney complication: Secondary | ICD-10-CM | POA: Diagnosis not present

## 2016-08-30 DIAGNOSIS — N2581 Secondary hyperparathyroidism of renal origin: Secondary | ICD-10-CM | POA: Diagnosis not present

## 2016-08-30 DIAGNOSIS — N186 End stage renal disease: Secondary | ICD-10-CM | POA: Diagnosis not present

## 2016-08-30 DIAGNOSIS — D631 Anemia in chronic kidney disease: Secondary | ICD-10-CM | POA: Diagnosis not present

## 2016-08-30 DIAGNOSIS — I5031 Acute diastolic (congestive) heart failure: Secondary | ICD-10-CM | POA: Diagnosis not present

## 2016-09-01 DIAGNOSIS — N186 End stage renal disease: Secondary | ICD-10-CM | POA: Diagnosis not present

## 2016-09-01 DIAGNOSIS — E1029 Type 1 diabetes mellitus with other diabetic kidney complication: Secondary | ICD-10-CM | POA: Diagnosis not present

## 2016-09-01 DIAGNOSIS — D631 Anemia in chronic kidney disease: Secondary | ICD-10-CM | POA: Diagnosis not present

## 2016-09-01 DIAGNOSIS — N2581 Secondary hyperparathyroidism of renal origin: Secondary | ICD-10-CM | POA: Diagnosis not present

## 2016-09-01 DIAGNOSIS — I5031 Acute diastolic (congestive) heart failure: Secondary | ICD-10-CM | POA: Diagnosis not present

## 2016-09-02 DIAGNOSIS — Z992 Dependence on renal dialysis: Secondary | ICD-10-CM | POA: Diagnosis not present

## 2016-09-02 DIAGNOSIS — E1122 Type 2 diabetes mellitus with diabetic chronic kidney disease: Secondary | ICD-10-CM | POA: Diagnosis not present

## 2016-09-02 DIAGNOSIS — N186 End stage renal disease: Secondary | ICD-10-CM | POA: Diagnosis not present

## 2016-09-03 DIAGNOSIS — I119 Hypertensive heart disease without heart failure: Secondary | ICD-10-CM

## 2016-09-03 HISTORY — DX: Hypertensive heart disease without heart failure: I11.9

## 2016-09-04 DIAGNOSIS — N186 End stage renal disease: Secondary | ICD-10-CM | POA: Diagnosis not present

## 2016-09-04 DIAGNOSIS — I5031 Acute diastolic (congestive) heart failure: Secondary | ICD-10-CM | POA: Diagnosis not present

## 2016-09-04 DIAGNOSIS — E1029 Type 1 diabetes mellitus with other diabetic kidney complication: Secondary | ICD-10-CM | POA: Diagnosis not present

## 2016-09-04 DIAGNOSIS — N2581 Secondary hyperparathyroidism of renal origin: Secondary | ICD-10-CM | POA: Diagnosis not present

## 2016-09-04 DIAGNOSIS — D631 Anemia in chronic kidney disease: Secondary | ICD-10-CM | POA: Diagnosis not present

## 2016-09-06 DIAGNOSIS — D631 Anemia in chronic kidney disease: Secondary | ICD-10-CM | POA: Diagnosis not present

## 2016-09-06 DIAGNOSIS — N2581 Secondary hyperparathyroidism of renal origin: Secondary | ICD-10-CM | POA: Diagnosis not present

## 2016-09-06 DIAGNOSIS — E1029 Type 1 diabetes mellitus with other diabetic kidney complication: Secondary | ICD-10-CM | POA: Diagnosis not present

## 2016-09-06 DIAGNOSIS — N186 End stage renal disease: Secondary | ICD-10-CM | POA: Diagnosis not present

## 2016-09-06 DIAGNOSIS — I5031 Acute diastolic (congestive) heart failure: Secondary | ICD-10-CM | POA: Diagnosis not present

## 2016-09-08 DIAGNOSIS — N2581 Secondary hyperparathyroidism of renal origin: Secondary | ICD-10-CM | POA: Diagnosis not present

## 2016-09-08 DIAGNOSIS — E1029 Type 1 diabetes mellitus with other diabetic kidney complication: Secondary | ICD-10-CM | POA: Diagnosis not present

## 2016-09-08 DIAGNOSIS — N186 End stage renal disease: Secondary | ICD-10-CM | POA: Diagnosis not present

## 2016-09-08 DIAGNOSIS — D631 Anemia in chronic kidney disease: Secondary | ICD-10-CM | POA: Diagnosis not present

## 2016-09-08 DIAGNOSIS — I5031 Acute diastolic (congestive) heart failure: Secondary | ICD-10-CM | POA: Diagnosis not present

## 2016-09-11 DIAGNOSIS — N186 End stage renal disease: Secondary | ICD-10-CM | POA: Diagnosis not present

## 2016-09-11 DIAGNOSIS — I5031 Acute diastolic (congestive) heart failure: Secondary | ICD-10-CM | POA: Diagnosis not present

## 2016-09-11 DIAGNOSIS — E1029 Type 1 diabetes mellitus with other diabetic kidney complication: Secondary | ICD-10-CM | POA: Diagnosis not present

## 2016-09-11 DIAGNOSIS — D631 Anemia in chronic kidney disease: Secondary | ICD-10-CM | POA: Diagnosis not present

## 2016-09-11 DIAGNOSIS — N2581 Secondary hyperparathyroidism of renal origin: Secondary | ICD-10-CM | POA: Diagnosis not present

## 2016-09-13 DIAGNOSIS — I5031 Acute diastolic (congestive) heart failure: Secondary | ICD-10-CM | POA: Diagnosis not present

## 2016-09-13 DIAGNOSIS — N186 End stage renal disease: Secondary | ICD-10-CM | POA: Diagnosis not present

## 2016-09-13 DIAGNOSIS — N2581 Secondary hyperparathyroidism of renal origin: Secondary | ICD-10-CM | POA: Diagnosis not present

## 2016-09-13 DIAGNOSIS — E1029 Type 1 diabetes mellitus with other diabetic kidney complication: Secondary | ICD-10-CM | POA: Diagnosis not present

## 2016-09-13 DIAGNOSIS — D631 Anemia in chronic kidney disease: Secondary | ICD-10-CM | POA: Diagnosis not present

## 2016-09-15 DIAGNOSIS — I5031 Acute diastolic (congestive) heart failure: Secondary | ICD-10-CM | POA: Diagnosis not present

## 2016-09-15 DIAGNOSIS — D631 Anemia in chronic kidney disease: Secondary | ICD-10-CM | POA: Diagnosis not present

## 2016-09-15 DIAGNOSIS — N2581 Secondary hyperparathyroidism of renal origin: Secondary | ICD-10-CM | POA: Diagnosis not present

## 2016-09-15 DIAGNOSIS — N186 End stage renal disease: Secondary | ICD-10-CM | POA: Diagnosis not present

## 2016-09-15 DIAGNOSIS — E1029 Type 1 diabetes mellitus with other diabetic kidney complication: Secondary | ICD-10-CM | POA: Diagnosis not present

## 2016-09-17 ENCOUNTER — Ambulatory Visit (INDEPENDENT_AMBULATORY_CARE_PROVIDER_SITE_OTHER): Payer: Medicare Other | Admitting: Orthopedic Surgery

## 2016-09-18 ENCOUNTER — Encounter: Payer: Self-pay | Admitting: Vascular Surgery

## 2016-09-18 DIAGNOSIS — D631 Anemia in chronic kidney disease: Secondary | ICD-10-CM | POA: Diagnosis not present

## 2016-09-18 DIAGNOSIS — N186 End stage renal disease: Secondary | ICD-10-CM | POA: Diagnosis not present

## 2016-09-18 DIAGNOSIS — I5031 Acute diastolic (congestive) heart failure: Secondary | ICD-10-CM | POA: Diagnosis not present

## 2016-09-18 DIAGNOSIS — N2581 Secondary hyperparathyroidism of renal origin: Secondary | ICD-10-CM | POA: Diagnosis not present

## 2016-09-18 DIAGNOSIS — E1029 Type 1 diabetes mellitus with other diabetic kidney complication: Secondary | ICD-10-CM | POA: Diagnosis not present

## 2016-09-20 DIAGNOSIS — N2581 Secondary hyperparathyroidism of renal origin: Secondary | ICD-10-CM | POA: Diagnosis not present

## 2016-09-20 DIAGNOSIS — N186 End stage renal disease: Secondary | ICD-10-CM | POA: Diagnosis not present

## 2016-09-20 DIAGNOSIS — D631 Anemia in chronic kidney disease: Secondary | ICD-10-CM | POA: Diagnosis not present

## 2016-09-20 DIAGNOSIS — E1029 Type 1 diabetes mellitus with other diabetic kidney complication: Secondary | ICD-10-CM | POA: Diagnosis not present

## 2016-09-20 DIAGNOSIS — I5031 Acute diastolic (congestive) heart failure: Secondary | ICD-10-CM | POA: Diagnosis not present

## 2016-09-21 ENCOUNTER — Other Ambulatory Visit: Payer: Self-pay

## 2016-09-21 ENCOUNTER — Encounter: Payer: Self-pay | Admitting: Vascular Surgery

## 2016-09-21 ENCOUNTER — Ambulatory Visit (INDEPENDENT_AMBULATORY_CARE_PROVIDER_SITE_OTHER): Payer: Medicare Other | Admitting: Vascular Surgery

## 2016-09-21 VITALS — BP 126/76 | HR 103 | Temp 97.2°F | Resp 20 | Ht 71.0 in | Wt 201.6 lb

## 2016-09-21 DIAGNOSIS — I209 Angina pectoris, unspecified: Secondary | ICD-10-CM | POA: Diagnosis not present

## 2016-09-21 DIAGNOSIS — I208 Other forms of angina pectoris: Secondary | ICD-10-CM

## 2016-09-21 DIAGNOSIS — N186 End stage renal disease: Secondary | ICD-10-CM | POA: Diagnosis not present

## 2016-09-21 DIAGNOSIS — Z992 Dependence on renal dialysis: Secondary | ICD-10-CM | POA: Diagnosis not present

## 2016-09-21 NOTE — Progress Notes (Signed)
Established Dialysis Access  History of Present Illness  [name] is a 56 y.o. ([birth year]) male who presents for cc: chest pain.  This patient was sent here today for evaluation for a R cephalic vein turndown.  He recent underwent a R arm fistulogram and venoplasty of the proximal cephalic vein near its confluence.   The patient notes no steal sx.  This R BC AVF was placed by Dr. Kellie Simmering in Feb 2016.  Pt also notes recurrent chest pain, worse during HD that improves with oxygen.  The patient has a known history of CHF but has not been seen by Cardiology recently/  Past Medical History:  Diagnosis Date  . Anemia   . Cellulitis and abscess of foot 08/24/2011  . CHF (congestive heart failure) ([place]) 2015  . Chronic renal disease, stage IV ([place]) 2015  . Cocaine abuse   . Constipation   . Diabetes mellitus 2015   type 2  . Diastolic congestive heart failure ([place])   . GERD (gastroesophageal reflux disease)    unsure  . Headache   . Hypertension 2015  . Multiple myeloma ([place])   . Protein calorie malnutrition ([place])   . PVD (peripheral vascular disease) ([place])   . Shortness of breath dyspnea     Past Surgical History:  Procedure Laterality Date  . AMPUTATION  09/06/2011   Procedure: AMPUTATION RAY;  Surgeon: Wylene Simmer, MD;  Location: [facility];  Service: Orthopedics;  Laterality: Left;  Left Hallux Amputation  . APPENDECTOMY    . AV FISTULA PLACEMENT Left 08/12/2014   Procedure: CREATION OF A BRACHIOCEPHALIC ARTERIOVENOUS (AV) FISTULA  LEFT ARM;  Surgeon: Mal Misty, MD;  Location: [facility];  Service: Vascular;  Laterality: Left;  . AV FISTULA PLACEMENT Right 10/07/2014   Procedure: Creation Right Arm Arteriovenous fistula;  Surgeon: Mal Misty, MD;  Location: [facility];  Service: Vascular;  Laterality: Right;  . KNEE SURGERY Right    "metal plate and screws" per patient   . LEG SURGERY      Social History   Social History  . Marital status: Legally Separated   Spouse name: N/A  . Number of children: N/A  . Years of education: N/A   Occupational History  . Not on file.   Social History Main Topics  . Smoking status: Never Smoker  . Smokeless tobacco: Never Used  . Alcohol use No     Comment: once in a while per patient "beer/brandy"  . Drug use: No     Comment: per pt* smoked marijuana as a teenager and last cocaine use was two months ago  . Sexual activity: No   Other Topics Concern  . Not on file   Social History Narrative  . No narrative on file    Family History  Problem Relation Age of Onset  . Adopted: Yes  . Varicose Veins Sister     Current Outpatient Prescriptions  Medication Sig Dispense Refill  . cinacalcet (SENSIPAR) 30 MG tablet Take 30 mg by mouth at bedtime. Reported on 12/21/2015    . diclofenac sodium (VOLTAREN) 1 % GEL Apply 4 g topically 4 (four) times daily. 100 g 5  . lanthanum (FOSRENOL) 1000 MG chewable tablet Chew 1,000 mg by mouth 3 (three) times daily with meals. Reported on 12/21/2015    . losartan (COZAAR) 25 MG tablet Take 25 mg by mouth daily. Reported on 12/21/2015     No current facility-administered medications for this visit.  No Known Allergies   REVIEW OF SYSTEMS:  (Positives checked otherwise negative)  CARDIOVASCULAR:   _0  chest pain,  _1  chest pressure,  _2  palpitations,  _3  shortness of breath when laying flat,  _4  shortness of breath with exertion,   _5  pain in feet when walking,  _6  pain in feet when laying flat, _7  history of blood clot in veins (DVT),  _8  history of phlebitis,  _9  swelling in legs,  _10  varicose veins  PULMONARY:   _11  productive cough,  _12  asthma,  _13  wheezing  NEUROLOGIC:   _14  weakness in arms or legs,  _15  numbness in arms or legs,  _16  difficulty speaking or slurred speech,  _17  temporary loss of vision in one eye,  _18  dizziness  HEMATOLOGIC:   _19  bleeding problems,  _20  problems with blood clotting too  easily  MUSCULOSKEL:   _21  joint pain, _22  joint swelling  GASTROINTEST:   _23  vomiting blood,  _24  blood in stool     GENITOURINARY:   _25  burning with urination,  _26  blood in urine  PSYCHIATRIC:   _27  history of major depression  INTEGUMENTARY:   _28  rashes,  _29  ulcers  CONSTITUTIONAL:   _30  fever,  _31  chills   Physical Examination  Vitals:   09/21/16 1357  BP: 126/76  Pulse: (!) 103  Resp: 20  Temp: 97.2 F (36.2 C)  TempSrc: Oral  SpO2: 97%  Weight: 201 lb 9.6 oz (91.4 kg)  Height: _32  (1.803 m)   Body mass index is 28.12 kg/m.  General: A&O x 3, WD, WN  Pulmonary: Sym exp, good air movt, CTAB, no rales, rhonchi, & wheezing  Cardiac: RRR, Nl S1, S2, no Murmurs, rubs or gallops, +click mitral valve listening area  Vascular: Vessel Right Left  Radial Not Palpable Faintly Palpable  Ulnar Not Palpable Not Palpable  Brachial Palpable Palpable   Gastrointestinal: soft, NTND, no G/R, bo HSM, no masses, no CVAT B  Musculoskeletal: M/S 5/5 throughout , Extremities without  ischemic changes, + palpable thrill in access in R UA, + bruit in access, small-mod aneurysms in cannulation site in R BC AVF, pulsatile proximal segment, 6 mm basilic vein evident on Sonosite  Neurologic: Pain and light touch intact in extremities , R hand grip 5/5, Motor exam as listed above  Outside R arm fistulogram:   Stenotic proximal cephalic vein  Slightly aneurysm perianastomotic segment  Outside Studies/Documentation 10 pages of outside documents were reviewed including: outside fistulogram report, nephrology chart.   Medical Decision Making  [name] is a 56 y.o. male who presents with ESRD with proximal cephalic vein stenosis, possible angina   I would agree that patient would likely benefit from a R cephalic vein turndown. Risk, benefits, and alternatives to access surgery were discussed.   The patient is aware the risks include but are not  limited to: bleeding, infection, steal syndrome, nerve damage, ischemic monomelic neuropathy, thrombosis, failure to mature, need for additional procedures, death and stroke.   The patient agrees to proceed forward with the procedure.  He will be scheduled on 5 FEB 18.  However, first, this patient will need to be evaluated by cardiology as some of his chest pain complaints are worrisome for possible angina.  My office has arranged an ASAP Cardiology evaluation for this patient.  He  is NOT currently sx, but I recommended he go to the ED if he has recurrence of the sx.   Adele Barthel, MD, FACS [facility] Office: [phone] Pager: [phone]

## 2016-09-22 DIAGNOSIS — E1029 Type 1 diabetes mellitus with other diabetic kidney complication: Secondary | ICD-10-CM | POA: Diagnosis not present

## 2016-09-22 DIAGNOSIS — N186 End stage renal disease: Secondary | ICD-10-CM | POA: Diagnosis not present

## 2016-09-22 DIAGNOSIS — N2581 Secondary hyperparathyroidism of renal origin: Secondary | ICD-10-CM | POA: Diagnosis not present

## 2016-09-22 DIAGNOSIS — I5031 Acute diastolic (congestive) heart failure: Secondary | ICD-10-CM | POA: Diagnosis not present

## 2016-09-22 DIAGNOSIS — D631 Anemia in chronic kidney disease: Secondary | ICD-10-CM | POA: Diagnosis not present

## 2016-09-24 ENCOUNTER — Other Ambulatory Visit: Payer: Self-pay

## 2016-09-25 DIAGNOSIS — I5031 Acute diastolic (congestive) heart failure: Secondary | ICD-10-CM | POA: Diagnosis not present

## 2016-09-25 DIAGNOSIS — E1029 Type 1 diabetes mellitus with other diabetic kidney complication: Secondary | ICD-10-CM | POA: Diagnosis not present

## 2016-09-25 DIAGNOSIS — N2581 Secondary hyperparathyroidism of renal origin: Secondary | ICD-10-CM | POA: Diagnosis not present

## 2016-09-25 DIAGNOSIS — D631 Anemia in chronic kidney disease: Secondary | ICD-10-CM | POA: Diagnosis not present

## 2016-09-25 DIAGNOSIS — N186 End stage renal disease: Secondary | ICD-10-CM | POA: Diagnosis not present

## 2016-09-26 ENCOUNTER — Encounter: Payer: Self-pay | Admitting: Nephrology

## 2016-09-27 DIAGNOSIS — E1029 Type 1 diabetes mellitus with other diabetic kidney complication: Secondary | ICD-10-CM | POA: Diagnosis not present

## 2016-09-27 DIAGNOSIS — D631 Anemia in chronic kidney disease: Secondary | ICD-10-CM | POA: Diagnosis not present

## 2016-09-27 DIAGNOSIS — N186 End stage renal disease: Secondary | ICD-10-CM | POA: Diagnosis not present

## 2016-09-27 DIAGNOSIS — N2581 Secondary hyperparathyroidism of renal origin: Secondary | ICD-10-CM | POA: Diagnosis not present

## 2016-09-27 DIAGNOSIS — I5031 Acute diastolic (congestive) heart failure: Secondary | ICD-10-CM | POA: Diagnosis not present

## 2016-09-28 ENCOUNTER — Ambulatory Visit (INDEPENDENT_AMBULATORY_CARE_PROVIDER_SITE_OTHER): Payer: Medicare Other | Admitting: Orthopedic Surgery

## 2016-09-29 ENCOUNTER — Emergency Department (HOSPITAL_COMMUNITY): Payer: Medicare Other

## 2016-09-29 ENCOUNTER — Inpatient Hospital Stay (HOSPITAL_COMMUNITY)
Admission: EM | Admit: 2016-09-29 | Discharge: 2016-10-03 | DRG: 193 | Disposition: A | Discharge status: Home / Self Care | Payer: Medicare Other | Attending: Internal Medicine | Admitting: Internal Medicine

## 2016-09-29 DIAGNOSIS — J181 Lobar pneumonia, unspecified organism: Secondary | ICD-10-CM | POA: Diagnosis not present

## 2016-09-29 DIAGNOSIS — I132 Hypertensive heart and chronic kidney disease with heart failure and with stage 5 chronic kidney disease, or end stage renal disease: Secondary | ICD-10-CM | POA: Diagnosis present

## 2016-09-29 DIAGNOSIS — R778 Other specified abnormalities of plasma proteins: Secondary | ICD-10-CM

## 2016-09-29 DIAGNOSIS — I43 Cardiomyopathy in diseases classified elsewhere: Secondary | ICD-10-CM

## 2016-09-29 DIAGNOSIS — F329 Major depressive disorder, single episode, unspecified: Secondary | ICD-10-CM | POA: Diagnosis present

## 2016-09-29 DIAGNOSIS — I5032 Chronic diastolic (congestive) heart failure: Secondary | ICD-10-CM | POA: Diagnosis not present

## 2016-09-29 DIAGNOSIS — Y95 Nosocomial condition: Secondary | ICD-10-CM | POA: Diagnosis present

## 2016-09-29 DIAGNOSIS — R7612 Nonspecific reaction to cell mediated immunity measurement of gamma interferon antigen response without active tuberculosis: Secondary | ICD-10-CM | POA: Diagnosis not present

## 2016-09-29 DIAGNOSIS — R079 Chest pain, unspecified: Secondary | ICD-10-CM | POA: Diagnosis not present

## 2016-09-29 DIAGNOSIS — E1151 Type 2 diabetes mellitus with diabetic peripheral angiopathy without gangrene: Secondary | ICD-10-CM | POA: Diagnosis present

## 2016-09-29 DIAGNOSIS — R748 Abnormal levels of other serum enzymes: Secondary | ICD-10-CM | POA: Diagnosis not present

## 2016-09-29 DIAGNOSIS — J189 Pneumonia, unspecified organism: Secondary | ICD-10-CM | POA: Diagnosis not present

## 2016-09-29 DIAGNOSIS — N184 Chronic kidney disease, stage 4 (severe): Secondary | ICD-10-CM

## 2016-09-29 DIAGNOSIS — D631 Anemia in chronic kidney disease: Secondary | ICD-10-CM | POA: Diagnosis not present

## 2016-09-29 DIAGNOSIS — Z590 Homelessness: Secondary | ICD-10-CM | POA: Diagnosis not present

## 2016-09-29 DIAGNOSIS — E1122 Type 2 diabetes mellitus with diabetic chronic kidney disease: Secondary | ICD-10-CM | POA: Diagnosis present

## 2016-09-29 DIAGNOSIS — I119 Hypertensive heart disease without heart failure: Secondary | ICD-10-CM

## 2016-09-29 DIAGNOSIS — R05 Cough: Secondary | ICD-10-CM | POA: Diagnosis not present

## 2016-09-29 DIAGNOSIS — E8889 Other specified metabolic disorders: Secondary | ICD-10-CM | POA: Diagnosis not present

## 2016-09-29 DIAGNOSIS — R7989 Other specified abnormal findings of blood chemistry: Secondary | ICD-10-CM

## 2016-09-29 DIAGNOSIS — Z992 Dependence on renal dialysis: Secondary | ICD-10-CM

## 2016-09-29 DIAGNOSIS — N2581 Secondary hyperparathyroidism of renal origin: Secondary | ICD-10-CM | POA: Diagnosis not present

## 2016-09-29 DIAGNOSIS — N186 End stage renal disease: Secondary | ICD-10-CM | POA: Diagnosis present

## 2016-09-29 DIAGNOSIS — K219 Gastro-esophageal reflux disease without esophagitis: Secondary | ICD-10-CM | POA: Diagnosis present

## 2016-09-29 DIAGNOSIS — E1165 Type 2 diabetes mellitus with hyperglycemia: Secondary | ICD-10-CM | POA: Diagnosis present

## 2016-09-29 DIAGNOSIS — R609 Edema, unspecified: Secondary | ICD-10-CM

## 2016-09-29 DIAGNOSIS — IMO0001 Reserved for inherently not codable concepts without codable children: Secondary | ICD-10-CM | POA: Diagnosis present

## 2016-09-29 DIAGNOSIS — I129 Hypertensive chronic kidney disease with stage 1 through stage 4 chronic kidney disease, or unspecified chronic kidney disease: Secondary | ICD-10-CM | POA: Diagnosis not present

## 2016-09-29 DIAGNOSIS — Z09 Encounter for follow-up examination after completed treatment for conditions other than malignant neoplasm: Secondary | ICD-10-CM

## 2016-09-29 DIAGNOSIS — E1129 Type 2 diabetes mellitus with other diabetic kidney complication: Secondary | ICD-10-CM

## 2016-09-29 DIAGNOSIS — F1910 Other psychoactive substance abuse, uncomplicated: Secondary | ICD-10-CM | POA: Diagnosis not present

## 2016-09-29 DIAGNOSIS — Z8249 Family history of ischemic heart disease and other diseases of the circulatory system: Secondary | ICD-10-CM | POA: Diagnosis not present

## 2016-09-29 LAB — INFLUENZA PANEL BY PCR (TYPE A & B)
Influenza A By PCR: NEGATIVE
Influenza B By PCR: NEGATIVE

## 2016-09-29 LAB — COMPREHENSIVE METABOLIC PANEL
ALK PHOS: 72 U/L (ref 38–126)
ALT: 14 U/L — ABNORMAL LOW (ref 17–63)
ANION GAP: 12 (ref 5–15)
AST: 18 U/L (ref 15–41)
Albumin: 3 g/dL — ABNORMAL LOW (ref 3.5–5.0)
BILIRUBIN TOTAL: 0.9 mg/dL (ref 0.3–1.2)
BUN: 50 mg/dL — ABNORMAL HIGH (ref 6–20)
CALCIUM: 9 mg/dL (ref 8.9–10.3)
CO2: 21 mmol/L — ABNORMAL LOW (ref 22–32)
Chloride: 103 mmol/L (ref 101–111)
Creatinine, Ser: 7.59 mg/dL — ABNORMAL HIGH (ref 0.61–1.24)
GFR calc Af Amer: 8 mL/min — ABNORMAL LOW (ref >60)
GFR, EST NON AFRICAN AMERICAN: 7 mL/min — AB (ref >60)
Glucose, Bld: 195 mg/dL — ABNORMAL HIGH (ref 65–99)
Potassium: 4.3 mmol/L (ref 3.5–5.1)
Sodium: 136 mmol/L (ref 135–145)
Total Protein: 6.8 g/dL (ref 6.5–8.1)

## 2016-09-29 LAB — CBC
HCT: 33.9 % — ABNORMAL LOW (ref 39.0–52.0)
HEMOGLOBIN: 11.2 g/dL — AB (ref 13.0–17.0)
MCH: 32.7 pg (ref 26.0–34.0)
MCHC: 33 g/dL (ref 30.0–36.0)
MCV: 99.1 fL (ref 78.0–100.0)
PLATELETS: 314 10*3/uL (ref 150–400)
RBC: 3.42 MIL/uL — AB (ref 4.22–5.81)
RDW: 14.9 % (ref 11.5–15.5)
WBC: 8.3 10*3/uL (ref 4.0–10.5)

## 2016-09-29 LAB — I-STAT CG4 LACTIC ACID, ED: LACTIC ACID, VENOUS: 2.03 mmol/L — AB (ref 0.5–1.9)

## 2016-09-29 LAB — CBC WITH DIFFERENTIAL/PLATELET
Basophils Absolute: 0 10*3/uL (ref 0.0–0.1)
Basophils Relative: 0 %
EOS ABS: 0.6 10*3/uL (ref 0.0–0.7)
Eosinophils Relative: 6 %
HEMATOCRIT: 30.8 % — AB (ref 39.0–52.0)
HEMOGLOBIN: 10.2 g/dL — AB (ref 13.0–17.0)
LYMPHS ABS: 0.8 10*3/uL (ref 0.7–4.0)
Lymphocytes Relative: 9 %
MCH: 32.6 pg (ref 26.0–34.0)
MCHC: 33.1 g/dL (ref 30.0–36.0)
MCV: 98.4 fL (ref 78.0–100.0)
MONOS PCT: 14 %
Monocytes Absolute: 1.3 10*3/uL — ABNORMAL HIGH (ref 0.1–1.0)
NEUTROS ABS: 6.3 10*3/uL (ref 1.7–7.7)
NEUTROS PCT: 71 %
Platelets: 304 10*3/uL (ref 150–400)
RBC: 3.13 MIL/uL — ABNORMAL LOW (ref 4.22–5.81)
RDW: 14.9 % (ref 11.5–15.5)
WBC: 8.9 10*3/uL (ref 4.0–10.5)

## 2016-09-29 LAB — BRAIN NATRIURETIC PEPTIDE: B NATRIURETIC PEPTIDE 5: 503.1 pg/mL — AB (ref 0.0–100.0)

## 2016-09-29 LAB — TROPONIN I
TROPONIN I: 0.07 ng/mL — AB (ref <0.03)
TROPONIN I: 0.05 ng/mL — AB (ref <0.03)

## 2016-09-29 LAB — I-STAT TROPONIN, ED: TROPONIN I, POC: 0.05 ng/mL (ref 0.00–0.08)

## 2016-09-29 LAB — GLUCOSE, CAPILLARY
GLUCOSE-CAPILLARY: 86 mg/dL (ref 65–99)
Glucose-Capillary: 146 mg/dL — ABNORMAL HIGH (ref 65–99)
Glucose-Capillary: 188 mg/dL — ABNORMAL HIGH (ref 65–99)

## 2016-09-29 LAB — STREP PNEUMONIAE URINARY ANTIGEN: STREP PNEUMO URINARY ANTIGEN: NEGATIVE

## 2016-09-29 MED ORDER — SODIUM CHLORIDE 0.9% FLUSH
3.0000 mL | Freq: Two times a day (BID) | INTRAVENOUS | Status: DC
  Administered 2016-09-29 – 2016-10-03 (×6): 3 mL via INTRAVENOUS

## 2016-09-29 MED ORDER — BENZONATATE 100 MG PO CAPS
100.0000 mg | ORAL_CAPSULE | Freq: Three times a day (TID) | ORAL | Status: DC | PRN
  Administered 2016-09-29 – 2016-10-03 (×10): 100 mg via ORAL
  Filled 2016-09-29 (×10): qty 1

## 2016-09-29 MED ORDER — GUAIFENESIN ER 600 MG PO TB12
600.0000 mg | ORAL_TABLET | Freq: Two times a day (BID) | ORAL | Status: DC
  Administered 2016-09-29 – 2016-10-03 (×9): 600 mg via ORAL
  Filled 2016-09-29 (×9): qty 1

## 2016-09-29 MED ORDER — DOXERCALCIFEROL 4 MCG/2ML IV SOLN
3.0000 ug | INTRAVENOUS | Status: DC
  Administered 2016-09-29 – 2016-10-02 (×2): 3 ug via INTRAVENOUS
  Filled 2016-09-29: qty 2

## 2016-09-29 MED ORDER — VANCOMYCIN HCL IN DEXTROSE 1-5 GM/200ML-% IV SOLN
1000.0000 mg | INTRAVENOUS | Status: DC
  Administered 2016-09-29: 1000 mg via INTRAVENOUS
  Filled 2016-09-29: qty 200

## 2016-09-29 MED ORDER — RENA-VITE PO TABS
1.0000 | ORAL_TABLET | Freq: Every day | ORAL | Status: DC
  Administered 2016-09-29 – 2016-10-02 (×4): 1 via ORAL
  Filled 2016-09-29 (×4): qty 1

## 2016-09-29 MED ORDER — SODIUM CHLORIDE 0.9 % IV BOLUS (SEPSIS)
500.0000 mL | Freq: Once | INTRAVENOUS | Status: AC
  Administered 2016-09-29: 500 mL via INTRAVENOUS

## 2016-09-29 MED ORDER — VANCOMYCIN HCL IN DEXTROSE 1-5 GM/200ML-% IV SOLN
INTRAVENOUS | Status: AC
  Filled 2016-09-29: qty 200

## 2016-09-29 MED ORDER — GI COCKTAIL ~~LOC~~: 30.0000 mL | Freq: Four times a day (QID) | ORAL | Status: DC | PRN

## 2016-09-29 MED ORDER — DOXERCALCIFEROL 4 MCG/2ML IV SOLN
INTRAVENOUS | Status: AC
  Filled 2016-09-29: qty 2

## 2016-09-29 MED ORDER — SODIUM CHLORIDE 0.9 % IV SOLN
2000.0000 mg | Freq: Once | INTRAVENOUS | Status: AC
  Administered 2016-09-29: 2000 mg via INTRAVENOUS
  Filled 2016-09-29: qty 2000

## 2016-09-29 MED ORDER — ASPIRIN EC 325 MG PO TBEC
325.0000 mg | DELAYED_RELEASE_TABLET | Freq: Every day | ORAL | Status: DC
  Administered 2016-09-30 – 2016-10-03 (×4): 325 mg via ORAL
  Filled 2016-09-29 (×4): qty 1

## 2016-09-29 MED ORDER — DEXTROSE 5 % IV SOLN
2.0000 g | Freq: Once | INTRAVENOUS | Status: AC
  Administered 2016-09-29: 2 g via INTRAVENOUS
  Filled 2016-09-29: qty 2

## 2016-09-29 MED ORDER — ONDANSETRON HCL 4 MG/2ML IJ SOLN: 4.0000 mg | Freq: Four times a day (QID) | INTRAMUSCULAR | Status: DC | PRN

## 2016-09-29 MED ORDER — ASPIRIN 81 MG PO CHEW: 324.0000 mg | CHEWABLE_TABLET | Freq: Once | ORAL | Status: DC

## 2016-09-29 MED ORDER — CINACALCET HCL 30 MG PO TABS
30.0000 mg | ORAL_TABLET | Freq: Every day | ORAL | Status: DC
  Administered 2016-09-29 – 2016-10-01 (×3): 30 mg via ORAL
  Filled 2016-09-29 (×3): qty 1

## 2016-09-29 MED ORDER — SODIUM CHLORIDE 0.9 % IV SOLN: 250.0000 mL | INTRAVENOUS | Status: DC | PRN

## 2016-09-29 MED ORDER — ACETAMINOPHEN 325 MG PO TABS
650.0000 mg | ORAL_TABLET | ORAL | Status: DC | PRN
  Administered 2016-09-29: 650 mg via ORAL
  Filled 2016-09-29: qty 2

## 2016-09-29 MED ORDER — HEPARIN SODIUM (PORCINE) 5000 UNIT/ML IJ SOLN
5000.0000 [IU] | Freq: Three times a day (TID) | INTRAMUSCULAR | Status: DC
  Administered 2016-09-29 – 2016-10-03 (×11): 5000 [IU] via SUBCUTANEOUS
  Filled 2016-09-29 (×10): qty 1

## 2016-09-29 MED ORDER — DEXTROSE 5 % IV SOLN
2.0000 g | INTRAVENOUS | Status: DC
  Administered 2016-09-29: 2 g via INTRAVENOUS
  Filled 2016-09-29 (×2): qty 2

## 2016-09-29 MED ORDER — HEPARIN SODIUM (PORCINE) 5000 UNIT/ML IJ SOLN: 5000.0000 [IU] | Freq: Three times a day (TID) | INTRAMUSCULAR | Status: DC

## 2016-09-29 MED ORDER — LANTHANUM CARBONATE 500 MG PO CHEW
1000.0000 mg | CHEWABLE_TABLET | Freq: Three times a day (TID) | ORAL | Status: DC
  Administered 2016-09-29 – 2016-10-03 (×10): 1000 mg via ORAL
  Filled 2016-09-29 (×16): qty 2

## 2016-09-29 MED ORDER — LOSARTAN POTASSIUM 25 MG PO TABS
25.0000 mg | ORAL_TABLET | Freq: Every day | ORAL | Status: DC
  Administered 2016-09-29 – 2016-10-03 (×5): 25 mg via ORAL
  Filled 2016-09-29 (×5): qty 1

## 2016-09-29 MED ORDER — SODIUM CHLORIDE 0.9% FLUSH
3.0000 mL | Freq: Two times a day (BID) | INTRAVENOUS | Status: DC
  Administered 2016-09-29 – 2016-10-02 (×7): 3 mL via INTRAVENOUS

## 2016-09-29 MED ORDER — SODIUM CHLORIDE 0.9% FLUSH: 3.0000 mL | INTRAVENOUS | Status: DC | PRN

## 2016-09-29 MED ORDER — NITROGLYCERIN 0.4 MG SL SUBL: 0.4000 mg | SUBLINGUAL_TABLET | SUBLINGUAL | Status: DC | PRN

## 2016-09-29 MED ORDER — INSULIN ASPART 100 UNIT/ML ~~LOC~~ SOLN
0.0000 [IU] | Freq: Three times a day (TID) | SUBCUTANEOUS | Status: DC
  Administered 2016-09-29: 1 [IU] via SUBCUTANEOUS
  Administered 2016-09-30: 2 [IU] via SUBCUTANEOUS
  Administered 2016-09-30: 1 [IU] via SUBCUTANEOUS
  Administered 2016-10-01 – 2016-10-02 (×3): 2 [IU] via SUBCUTANEOUS
  Administered 2016-10-03: 1 [IU] via SUBCUTANEOUS
  Administered 2016-10-03: 2 [IU] via SUBCUTANEOUS

## 2016-09-29 NOTE — ED Triage Notes (Addendum)
[facility]- pt has left upper chest pain intermittently X3 days. Pt missed Thursday dialysis, scheduled today. R arm access. Pt states he has been running a fever and non productive cough. 324mg  of aspirin given PTA.

## 2016-09-29 NOTE — H&P (Signed)
[facility] History and Physical  [name]:[id number] DOB: Aug 19, 1961 DOA: 09/29/2016  Referring physician: ED PCP: Minerva Ends, MD   Chief Complaint: chest pain  HPI: [name] is a 56 y.o. male with history of hypertension, end-stage renal disease on hemodialysis, chronic anemia secondary to end-stage renal disease, diabetes, gerd, presents with left chest pain that started on 5:00 this morning.  Co of mild pleuritic chest pain as well.  Of note, she's been complaining of chest pain for about a week now, but it worse this morning.  Finally came to ED to have further evaluation. He saw Dr. Bridgett Larsson, the vascular surgeon recently for cephalic vein stenosis, and recommended for cardiac workup.  He has an appointment with outpatient cardiology on January 31.  He also notes nonproductive cough, low-grade fevers (t 99-100 at home), myalgias, body aches (mainly bilat hip regions), rhinitis and sore throat, since the beginning of this week as well (about same time as chest pain). He was at dialysis on Thursday, but they were only able to do about one and half hour session secondary to problems with his fistula.  Of note, he is mild ttp on left chest wall on exam.  This am worsening cough + nausea.  His dgt had the flu 6 days ago.  Denies tob or etoh.    Review of Systems:  Per hpi, o/w all systems reviewed and negative.  Past Medical History:  Diagnosis Date  . Anemia   . Cellulitis and abscess of foot 08/24/2011  . CHF (congestive heart failure) ([place]) 2015  . Chronic renal disease, stage IV ([place]) 2015  . Cocaine abuse   . Constipation   . Diabetes mellitus 2015   type 2  . Diastolic congestive heart failure ([place])   . GERD (gastroesophageal reflux disease)    unsure  . Headache   . Hypertension 2015  . Multiple myeloma ([place])   . Protein calorie malnutrition ([place])   . PVD (peripheral vascular disease) ([place])   . Shortness of breath dyspnea     Past Surgical History:  Procedure Laterality Date  . AMPUTATION  09/06/2011   Procedure: AMPUTATION RAY;  Surgeon: Wylene Simmer, MD;  Location: [place];  Service: Orthopedics;  Laterality: Left;  Left Hallux Amputation  . APPENDECTOMY    . AV FISTULA PLACEMENT Left 08/12/2014   Procedure: CREATION OF A BRACHIOCEPHALIC ARTERIOVENOUS (AV) FISTULA  LEFT ARM;  Surgeon: Mal Misty, MD;  Location: [place];  Service: Vascular;  Laterality: Left;  . AV FISTULA PLACEMENT Right 10/07/2014   Procedure: Creation Right Arm Arteriovenous fistula;  Surgeon: Mal Misty, MD;  Location: [place];  Service: Vascular;  Laterality: Right;  . KNEE SURGERY Right    "metal plate and screws" per patient   . LEG SURGERY     Social History:  reports that he has never smoked. He has never used smokeless tobacco. He reports that he does not drink alcohol or use drugs.  No Known Allergies  Family History  Problem Relation Age of Onset  . Adopted: Yes  . Varicose Veins Sister    Prior to Admission medications   Medication Sig Start Date End Date Taking? Authorizing Provider  cinacalcet (SENSIPAR) 30 MG tablet Take 30 mg by mouth at bedtime. Reported on 12/21/2015   Yes Historical Provider, MD  lanthanum (FOSRENOL) 1000 MG chewable tablet Chew 1,000 mg by mouth 3 (three) times daily with meals. Reported on 12/21/2015   Yes Historical Provider,  MD  diclofenac sodium (VOLTAREN) 1 % GEL Apply 4 g topically 4 (four) times daily. Patient not taking: Reported on 09/29/2016 07/20/16   [name], MD  losartan (COZAAR) 25 MG tablet Take 25 mg by mouth daily. Reported on 12/21/2015    Historical Provider, MD   Physical Exam: Vitals:   09/29/16 0745 09/29/16 0800 09/29/16 0815 09/29/16 0830  BP: 147/85 153/85 161/87 149/85  Pulse: 84 80 84 84  Resp: 12 19 15 18   Temp:      TempSrc:      SpO2: 98% 94% 97% 97%    Wt Readings from Last 3 Encounters:  09/21/16 91.4 kg (201 lb 9.6 oz)  07/20/16 91.6 kg (202 lb)   12/21/15 93.4 kg (206 lb)    General:  Appears calm and comfortable, pleasant, NAD, AAOx3, pleasant, tired appearing. Eyes: PERRL, normal lids, irises & conjunctiva ENT: grossly normal hearing, lips & tongue, mmm Neck: no LAD, masses or thyromegaly Cardiovascular: RRR, no m/r/g. No LE edema.  Mild ttp to left chest wall region on palpation.  Mckenzie AF fistula with thrill. Telemetry: SR, no arrhythmias  Respiratory: CTA bilaterally, no w/r/r. Normal respiratory effort. Abdomen: soft, ntnd Skin: no rash or induration seen on limited exam Musculoskeletal: grossly normal tone BUE/BLE Psychiatric: grossly normal mood and affect, speech fluent and appropriate Neurologic: grossly non-focal.          Labs on Admission:  Basic Metabolic Panel:  Recent Labs Lab 09/29/16 0820  NA 136  K 4.3  CL 103  CO2 21*  GLUCOSE 195*  BUN 50*  CREATININE 7.59*  CALCIUM 9.0   Liver Function Tests:  Recent Labs Lab 09/29/16 0820  AST 18  ALT 14*  ALKPHOS 72  BILITOT 0.9  PROT 6.8  ALBUMIN 3.0*   No results for input(s): LIPASE, AMYLASE in the last 168 hours. No results for input(s): AMMONIA in the last 168 hours. CBC:  Recent Labs Lab 09/29/16 0820  WBC 8.9  NEUTROABS 6.3  HGB 10.2*  HCT 30.8*  MCV 98.4  PLT 304   Cardiac Enzymes: No results for input(s): CKTOTAL, CKMB, CKMBINDEX, TROPONINI in the last 168 hours.  BNP (last 3 results)  Recent Labs  09/29/16 0820  BNP 503.1*    ProBNP (last 3 results) No results for input(s): PROBNP in the last 8760 hours.  CBG: No results for input(s): GLUCAP in the last 168 hours.  Radiological Exams on Admission: Dg Chest 2 View  Result Date: 09/29/2016 CLINICAL DATA:  Pt c/o left side chest pain starting this morning. Pt states he has had a dry cough, congestion, headache, dizziness, and intermittent fever for 3 days. Patient reports he missed dialysis Thursday. Hx HTN, diabetes, CAD, nonsmoker. EXAM: CHEST  2 VIEW COMPARISON:   03/14/2015 FINDINGS: There is focal opacity projecting above the minor fissure on the frontal view, which may reside in the posterior right upper lobe or superior segment right lower lobe based on the lateral view. This is new from the prior exam and is consistent with pneumonia. Remainder of the lungs is clear. No pleural effusion or pneumothorax. Cardiac silhouette is normal in size. No mediastinal or hilar masses. No convincing adenopathy. Skeletal structures are intact. IMPRESSION: 1. Small area of pneumonia within the posterior right upper lobe or superior segment right lower lobe. No other acute abnormalities. Electronically Signed   By: Lajean Manes M.D.   On: 09/29/2016 09:05    EKG: Independently reviewed.   EKG Interpretation  Date/Time:  Saturday September 29 2016 07:42:22 EST Ventricular Rate:  91 PR Interval:    QRS Duration: 95 QT Interval:  355 QTC Calculation: 437 R Axis:   32 Text Interpretation:  Sinus rhythm No significant change since last tracing Confirmed by [facility] MD, [place] ([id number]) on 09/29/2016 7:58:59 AM        Assessment/Plan Principal Problem:   HCAP (healthcare-associated pneumonia) Active Problems:   End stage renal disease on dialysis ([place])   Anemia of chronic renal failure, stage 4 (severe) (HCC)   Type 2 diabetes, controlled, with renal manifestation (HCC)   Chest pain   1. hcap -  Noted on cxr in posterior right upper lobe or superior segment right lower lobe., on HD for esrd, so treating as hcap - admit to tele - borderline elevated lactic acid, but not certain if this is due to his renal disease or pna, currently not hypotensive. - emp vancomycin / cefepime, pharmacy to assist w/ dosing - he does not appear septic, received ns 563m in ed to prevent vol overload from esrd. - chk urine leg/strep/sputum cxr/blood cxs - mucinex 600bid - tessalon pearl prn. - out of window for tamiflu, flu swab neg today.  2. esrd t/r/s - ER to notify Renal for  hd today. - per pt, had diff w/ his avf on Thursday, will defer to renal  3. Chest pain syndrome - pain on palpation today, so not certain if this is due to coughing/pna /gerd/msck or cardiac in nature - ekg and trop so far neg. -serial trop - if bump trop, will cs Cardiology in house - if flat trop, than encouraged pt to keep outpt cards f/u on 1/31 that is already set up. - asa 325 qd - am lipid panel - echo ordered, last echo 7/16 with ef 55-60%,   4. Dm 2 w/ hyperglycemia Riss, chk a1c  5. Hypertension, appears controlled - resumed his home losartan 25 qd  6. Anemia 2nd to chronic disease - hh stable, 10/30, defer to renal  Renal was consulted by ED for HD today  Code Status: full DVT Prophylaxis:  Hep [place] tid Family Communication: pt, no family at bedside Disposition Plan: inpt tele  Time spent: 413ms  DaMaren ReamerD., MBA/MHA [facility] Pager [phone]

## 2016-09-29 NOTE — ED Notes (Signed)
Report called and given  

## 2016-09-29 NOTE — Progress Notes (Signed)
CRITICAL VALUE ALERT  Critical value received:  Troponin 0.07  Date of notification:  09/29/16  Time of notification:  1630  Critical value read back: yes  Nurse who received alert:  Shelton Silvas, RN   MD notified (1st page):  Dr. Janne Napoleon   Time of first page:  1755   MD notified (2nd page):  Time of second page:  Responding MD:  Janne Napoleon   Time MD responded:  1800

## 2016-09-29 NOTE — ED Notes (Signed)
Transporting PT up to room.   PT socks, hat, all black shirt and work shirt, and jacket in one bag. PT shoes, and jacket in second bag. PT personal bag has just a pillow and blanket
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
## 2016-09-29 NOTE — ED Notes (Signed)
Given socks and helped to the restroom

## 2016-09-29 NOTE — Consult Note (Signed)
[facility] Renal Consultation Note    Indication for Consultation:  Management of ESRD/hemodialysis; anemia, hypertension/volume and secondary hyperparathyroidism  PCP: Dr. Adrian Blackwater [facility] Referring physician:  Dr. Lottie Mussel  HPI: [name] is a 56 y.o. 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  He asked his co-worker to call EMS and came to the hospital.  He works 15 - 20 hours per week.  His intent was to work a partial shift today and then go to dialysis. He reported chest discomfort when seen by Dr. Bridgett Larsson 1/19 and he was referred to cardiology for possible angina. Work up.  He was afebrile at his dialysis treatments His last dialysis treatment was 1/25.  He had an issue with infiltration and the treatment was stopped after 1.25 hours - post weight was 91.8 (EDW 90). He was supposed to come to dialysis Friday for a make up treatment, but did not.    He has tremendous stress at home and has talked with the SW at his dialysis center about it. He lives with his "fiance" who is the mother of his 25 year old plus her 39 year old and is still married and send his entire SS check to his wife with 4 children in [place]. He has contemplated leaving and moving to a shelter. The two children were sick and out of school for a while with "the flu.'  He has had coughing/dry and occasional sweats. No N, V, D.   Evaluation in the ED was negative for influenza B, CXR R upper PNA, WBC was 8.9 with normal differential. K was 4.3 BNP 503 trop 0.05, EKG showed NSR., He was afebrile, lactic acid was 2.03  Past Medical History:  Diagnosis Date  . Anemia   . Cellulitis and abscess of foot 08/24/2011  . CHF (congestive heart failure) ([place]) 2015  . Chronic renal disease, stage IV ([place]) 2015  . Cocaine abuse   .  Constipation   . Diabetes mellitus 2015   type 2  . Diastolic congestive heart failure ([place])   . GERD (gastroesophageal reflux disease)    unsure  . Headache   . Hypertension 2015  . Multiple myeloma ([place])   . Protein calorie malnutrition ([place])   . PVD (peripheral vascular disease) ([place])   . Shortness of breath dyspnea    Past Surgical History:  Procedure Laterality Date  . AMPUTATION  09/06/2011   Procedure: AMPUTATION RAY;  Surgeon: Wylene Simmer, MD;  Location: [place];  Service: Orthopedics;  Laterality: Left;  Left Hallux Amputation  . APPENDECTOMY    . AV FISTULA PLACEMENT Left 08/12/2014   Procedure: CREATION OF A BRACHIOCEPHALIC ARTERIOVENOUS (AV) FISTULA  LEFT ARM;  Surgeon: Mal Misty, MD;  Location: [place];  Service: Vascular;  Laterality: Left;  . AV FISTULA PLACEMENT Right 10/07/2014   Procedure: Creation Right Arm Arteriovenous fistula;  Surgeon: Mal Misty, MD;  Location: [place];  Service: Vascular;  Laterality: Right;  . KNEE SURGERY Right    "metal plate and screws" per patient   . LEG SURGERY     Family History  Problem Relation Age of Onset  . Adopted: Yes  . Varicose Veins Sister    Social History:  reports that he has never smoked. He has never  used smokeless tobacco. He reports that he does not drink alcohol or use drugs. No Known Allergies Prior to Admission medications   Medication Sig Start Date End Date Taking? Authorizing Provider  cinacalcet (SENSIPAR) 30 MG tablet Take 30 mg by mouth at bedtime. Reported on 12/21/2015   Yes Historical Provider, MD  lanthanum (FOSRENOL) 1000 MG chewable tablet Chew 1,000 mg by mouth 3 (three) times daily with meals. Reported on 12/21/2015   Yes Historical Provider, MD  diclofenac sodium (VOLTAREN) 1 % GEL Apply 4 g topically 4 (four) times daily. Patient not taking: Reported on 09/29/2016 07/20/16   [name], MD  losartan (COZAAR) 25 MG tablet Take 25 mg by mouth daily. Reported on 12/21/2015    Historical  Provider, MD   Current Facility-Administered Medications  Medication Dose Route Frequency Provider Last Rate Last Dose  . 0.9 %  sodium chloride infusion  250 mL Intravenous PRN Maren Reamer, MD      . acetaminophen (TYLENOL) tablet 650 mg  650 mg Oral Q4H PRN Maren Reamer, MD      . aspirin chewable tablet 324 mg  324 mg Oral Once Gareth Morgan, MD      . Derrill Memo ON 09/30/2016] aspirin EC tablet 325 mg  325 mg Oral Daily Maren Reamer, MD      . benzonatate (TESSALON) capsule 100 mg  100 mg Oral TID PRN Maren Reamer, MD   100 mg at 09/29/16 1251  . ceFEPIme (MAXIPIME) 2 g in dextrose 5 % 50 mL IVPB  2 g Intravenous Q T,Th,Sa-HD Tyrone Apple, RPH      . cinacalcet (SENSIPAR) tablet 30 mg  30 mg Oral Q supper Maren Reamer, MD      . gi cocktail (Maalox,Lidocaine,Donnatal)  30 mL Oral QID PRN Maren Reamer, MD      . guaiFENesin (MUCINEX) 12 hr tablet 600 mg  600 mg Oral BID Maren Reamer, MD   600 mg at 09/29/16 1250  . heparin injection 5,000 Units  5,000 Units Subcutaneous Q8H Dawn T Langeland, MD      . insulin aspart (novoLOG) injection 0-9 Units  0-9 Units Subcutaneous TID WC Maren Reamer, MD      . lanthanum (FOSRENOL) chewable tablet 1,000 mg  1,000 mg Oral TID WC Maren Reamer, MD   1,000 mg at 09/29/16 1250  . losartan (COZAAR) tablet 25 mg  25 mg Oral Daily Maren Reamer, MD   25 mg at 09/29/16 1249  . nitroGLYCERIN (NITROSTAT) SL tablet 0.4 mg  0.4 mg Sublingual Q5 min PRN Gareth Morgan, MD      . ondansetron (ZOFRAN) injection 4 mg  4 mg Intravenous Q6H PRN Maren Reamer, MD      . sodium chloride flush (NS) 0.9 % injection 3 mL  3 mL Intravenous Q12H Dawn T Langeland, MD      . sodium chloride flush (NS) 0.9 % injection 3 mL  3 mL Intravenous Q12H Dawn T Langeland, MD      . sodium chloride flush (NS) 0.9 % injection 3 mL  3 mL Intravenous PRN Maren Reamer, MD      . vancomycin (VANCOCIN) IVPB 1000 mg/200 mL premix  1,000 mg Intravenous Q  T,Th,Sa-HD Tyrone Apple, [facility]       Labs: Basic Metabolic Panel:  Recent Labs Lab 09/29/16 0820  NA 136  K 4.3  CL 103  CO2 21*  GLUCOSE 195*  BUN 50*  CREATININE 7.59*  CALCIUM 9.0   Liver Function Tests:  Recent Labs Lab 09/29/16 0820  AST 18  ALT 14*  ALKPHOS 72  BILITOT 0.9  PROT 6.8  ALBUMIN 3.0*   CBC:  Recent Labs Lab 09/29/16 0820  WBC 8.9  NEUTROABS 6.3  HGB 10.2*  HCT 30.8*  MCV 98.4  PLT 304  CBG:  Recent Labs Lab 09/29/16 1217  GLUCAP 86  Studies/Results: Dg Chest 2 View  Result Date: 09/29/2016 CLINICAL DATA:  Pt c/o left side chest pain starting this morning. Pt states he has had a dry cough, congestion, headache, dizziness, and intermittent fever for 3 days. Patient reports he missed dialysis Thursday. Hx HTN, diabetes, CAD, nonsmoker. EXAM: CHEST  2 VIEW COMPARISON:  03/14/2015 FINDINGS: There is focal opacity projecting above the minor fissure on the frontal view, which may reside in the posterior right upper lobe or superior segment right lower lobe based on the lateral view. This is new from the prior exam and is consistent with pneumonia. Remainder of the lungs is clear. No pleural effusion or pneumothorax. Cardiac silhouette is normal in size. No mediastinal or hilar masses. No convincing adenopathy. Skeletal structures are intact. IMPRESSION: 1. Small area of pneumonia within the posterior right upper lobe or superior segment right lower lobe. No other acute abnormalities. Electronically Signed   By: Lajean Manes M.D.   On: 09/29/2016 09:05    ROS: As per HPI otherwise negative.  Physical Exam: Vitals:   09/29/16 0800 09/29/16 0815 09/29/16 0830 09/29/16 1202  BP: 153/85 161/87 149/85 (!) 174/94  Pulse: 80 84 84 86  Resp: 19 15 18 20   Temp:    97.6 F (36.4 C)  TempSrc:    Oral  SpO2: 94% 97% 97% 100%  Weight:    94.4 kg (208 lb 1.8 oz)  Height:    5' 11"  (1.803 m)     General: WDWN NAD breathing easily on room air Head: NCAT  sclera not icteric MMM Neck: Supple.  Lungs: CTA bilaterally without wheezes, rales, or rhonchi. Breathing is unlabored. Heart: RRR;  Pain over left chest below heart on palpation Abdomen: soft NT + BS Lower extremities:without edema or ischemic changes, no open wounds  Neuro: A & O  X 3. Moves all extremities spontaneously. Psych:  Responds to questions appropriately with a normal affect. Dialysis Access: right upper AVF + bruit  Dialysis Orders: TTS [facility] 4 hr EDW 90 2K 2.5 Ca AVF heparin 6000 with 2000 mid tmt venofer 50 per week Mircera 50 q 4 weeks last 1/16 hectorol 2 right upper AVF Recent labs: hgb 11 stable 41% sat ferritin 1214 iPTH 717 corr Ca 9.8 P 5.9 Access issues: evaluated by Dr. Bridgett Larsson 1/19 - has stenotic prox ceph vein - needs turndown procedure - but needs cardiology eval first due to angina like sx  Assessment/Plan: 1.  HCAP with chest pain - vanc and cefepime per primary, cultures pending/work up per primary; does not look acutely ill 2.  ESRD -  TTS - HD today - should be able to use AVF today 3.  Hypertension/volume  - BP control - with volume removal and low dose losartan 4.  Anemia  - hgb 10.2 down some from outpt labs - follow - stop Fe for now due to ^ ferritin and adequate tsat - not due for ESA for 2 weeks - follow trending - if hgb drops further can redose sooner 5.  Metabolic bone disease -  Continue hectorol -- actually need to ^ hectorol to 3 and change bath to 2.25 at discharge; continue sensipar and fosrenol 6.  Nutrition - renal diet/vits 7.    Significant psycho social stressors - ? If contributory to symptoms of CP - for cardiology work up as [place], PA-C [place] [phone] 09/29/2016, 1:11 PM   Pt seen, examined and agree w A/P as above.  Kelly Splinter MD [employer] pager [phone]   09/29/2016, 7:09 PM

## 2016-09-29 NOTE — Procedures (Signed)
  I was present at this dialysis session, have reviewed the session itself and made  appropriate changes Rob Noe Pittsley MD [facility] pager [phone]   09/29/2016, 7:11 PM    

## 2016-09-29 NOTE — Progress Notes (Signed)
Pharmacy Antibiotic Note  [name] is a 56 y.o. male admitted on 09/29/2016 with chest pain.  Pharmacy has been consulted for vancomycin and cefepime dosing for PNA.  Patient has ESRD on dialysis TTS.  Per documentation, patient missed his last session on 09/27/16.  Plan: - Vanc 2gm IV x 1, then 1gm IV q-HD TTS for pre-HD goal of 15-25 mcg/mL - Cefepime 2gm IV q-HD TTS - Monitor HD schedule/tolerance, clinical progress, vanc level as indicated - RN aware to give first doses of antibiotics and to hold subsequent doses if he does not go to dialysis today.     Temp (24hrs), Avg:97.9 F (36.6 C), Min:97.9 F (36.6 C), Max:97.9 F (36.6 C)   Recent Labs Lab 09/29/16 0820 09/29/16 0836  WBC 8.9  --   CREATININE 7.59*  --   LATICACIDVEN  --  2.03*    Estimated Creatinine Clearance: 12.7 mL/min (by C-G formula based on SCr of 7.59 mg/dL (H)).    No Known Allergies   Vanc 1/27 >> Cefepime 1/27 >>  1/27 BCx x2 -    [name] D. Mina Marble, PharmD, BCPS Pager:  [phone] 09/29/2016, 9:37 AM

## 2016-09-29 NOTE — ED Notes (Signed)
Breakfast tray ordered 

## 2016-09-29 NOTE — ED Provider Notes (Signed)
[facility] DEPT Provider Note   CSN: [id number] Arrival date & time: 09/29/16  0100     History   Chief Complaint Chief Complaint  Patient presents with  . Chest Pain    HPI [name] is a 56 y.o. male.  HPI  Chest began hurting, feeling like an aching, stabbing/needle, started at 5AM. Starts then goes away. When sit down, relax better. CP is worse with exertion, associated with sweating, shortness of breath, nausea Had seen Dr. Bridgett Larsson, vascular regarding cephalic vein stenosis, was having exertional symptoms at time of evaluation and was told to see Cardiology and is scheduled for 1/31, however reports that his symptoms are significantly worse and this morning are associated with rest. Pain nonradiating left side of chest   Had 1 out of 4hr of dialysis on [place] due to problems with fistula and is supposed tohave dialysis today.  Has also had 3 days of generalized weakness, cough, fever Fever 99-100.0 oral temp at home over 3 days This morning with cough, nausea  Also having other body aches in hips, also sore throat, runny nose CP was biggest thing to bring him in Daughter had flu 6 days ago   No hx of CAD, no hx of DVT/PE  Past Medical History:  Diagnosis Date  . Anemia   . Cellulitis and abscess of foot 08/24/2011  . CHF (congestive heart failure) ([place]) 2015  . Chronic renal disease, stage IV ([place]) 2015  . Cocaine abuse   . Constipation   . Diabetes mellitus 2015   type 2  . Diastolic congestive heart failure ([place])   . GERD (gastroesophageal reflux disease)    unsure  . Headache   . Hypertension 2015  . Multiple myeloma ([place])   . Protein calorie malnutrition ([place])   . PVD (peripheral vascular disease) ([place])   . Shortness of breath dyspnea     Patient Active Problem List   Diagnosis Date Noted  . HCAP (healthcare-associated pneumonia) 09/29/2016  . Pneumonia 09/29/2016  . Chronic pain of right knee 08/08/2016  . Mechanical knee pain,  right 07/20/2016  . Trigger finger, left ring finger 07/20/2016  . Chronic bilateral low back pain without sciatica 07/20/2016  . Chest pain 12/21/2015  . Poor dentition 07/06/2015  . Lateral pain of left hip 07/06/2015  . Erectile dysfunction 07/06/2015  . End stage renal disease on dialysis ([place]) 03/15/2015  . Major depressive disorder, single episode, moderate ([place]) 02/09/2015  . Stimulant use disorder (cocaine) 02/09/2015  . Alcohol use disorder, moderate, dependence ([place]) 02/09/2015  . Chronic diastolic congestive heart failure ([place])   . IgA monoclonal gammopathy 12/28/2014  . GERD (gastroesophageal reflux disease) 11/08/2014  . Type 2 diabetes, controlled, with renal manifestation ([place]) 09/06/2013  . Hypertension 08/27/2011  . Anemia of chronic renal failure, stage 4 (severe) ([place]) 08/27/2011    Past Surgical History:  Procedure Laterality Date  . AMPUTATION  09/06/2011   Procedure: AMPUTATION RAY;  Surgeon: Wylene Simmer, MD;  Location: [facility];  Service: Orthopedics;  Laterality: Left;  Left Hallux Amputation  . APPENDECTOMY    . AV FISTULA PLACEMENT Left 08/12/2014   Procedure: CREATION OF A BRACHIOCEPHALIC ARTERIOVENOUS (AV) FISTULA  LEFT ARM;  Surgeon: Mal Misty, MD;  Location: [facility];  Service: Vascular;  Laterality: Left;  . AV FISTULA PLACEMENT Right 10/07/2014   Procedure: Creation Right Arm Arteriovenous fistula;  Surgeon: Mal Misty, MD;  Location: [facility];  Service: Vascular;  Laterality: Right;  . KNEE  SURGERY Right    "metal plate and screws" per patient   . LEG SURGERY         Home Medications    Prior to Admission medications   Medication Sig Start Date End Date Taking? Authorizing Provider  cinacalcet (SENSIPAR) 30 MG tablet Take 30 mg by mouth at bedtime. Reported on 12/21/2015   Yes Historical Provider, MD  lanthanum (FOSRENOL) 1000 MG chewable tablet Chew 1,000 mg by mouth 3 (three) times daily with meals. Reported on 12/21/2015   Yes Historical  Provider, MD  diclofenac sodium (VOLTAREN) 1 % GEL Apply 4 g topically 4 (four) times daily. Patient not taking: Reported on 09/29/2016 07/20/16   [name], MD  losartan (COZAAR) 25 MG tablet Take 25 mg by mouth daily. Reported on 12/21/2015    Historical Provider, MD    Family History Family History  Problem Relation Age of Onset  . Adopted: Yes  . Varicose Veins Sister     Social History Social History  Substance Use Topics  . Smoking status: Never Smoker  . Smokeless tobacco: Never Used  . Alcohol use No     Comment: once in a while per patient "beer/brandy"     Allergies   Patient has no known allergies.   Review of Systems Review of Systems  Constitutional: Positive for fever.  HENT: Positive for congestion and sore throat.   Eyes: Negative for visual disturbance.  Respiratory: Positive for cough and shortness of breath.   Cardiovascular: Negative for chest pain.  Gastrointestinal: Positive for nausea and vomiting. Negative for abdominal pain and diarrhea.  Genitourinary: Negative for difficulty urinating (makes very little dialysis).  Musculoskeletal: Positive for arthralgias. Negative for back pain and neck stiffness.  Skin: Negative for rash.  Neurological: Positive for light-headedness and headaches. Negative for syncope.     Physical Exam Updated Vital Signs BP 149/85   Pulse 84   Temp 97.9 F (36.6 C) (Oral)   Resp 18   SpO2 97%   Physical Exam  Constitutional: He is oriented to person, place, and time. He appears well-developed and well-nourished. No distress.  HENT:  Head: Normocephalic and atraumatic.  Eyes: Conjunctivae and EOM are normal.  Neck: Normal range of motion.  Cardiovascular: Normal rate, regular rhythm, normal heart sounds and intact distal pulses.  Exam reveals no gallop and no friction rub.   No murmur heard. Pulmonary/Chest: Effort normal and breath sounds normal. No respiratory distress. He has no wheezes. He has no rales.  He exhibits tenderness.  Abdominal: Soft. He exhibits no distension. There is no tenderness. There is no guarding.  Musculoskeletal: He exhibits no edema.  Right AV fistula with thrill  Neurological: He is alert and oriented to person, place, and time.  Skin: Skin is warm and dry. He is not diaphoretic.  Nursing note and vitals reviewed.    ED Treatments / Results  Labs (all labs ordered are listed, but only abnormal results are displayed) Labs Reviewed  CBC WITH DIFFERENTIAL/PLATELET - Abnormal; Notable for the following:       Result Value   RBC 3.13 (*)    Hemoglobin 10.2 (*)    HCT 30.8 (*)    Monocytes Absolute 1.3 (*)    All other components within normal limits  COMPREHENSIVE METABOLIC PANEL - Abnormal; Notable for the following:    CO2 21 (*)    Glucose, Bld 195 (*)    BUN 50 (*)    Creatinine, Ser 7.59 (*)  Albumin 3.0 (*)    ALT 14 (*)    GFR calc non Af Amer 7 (*)    GFR calc Af Amer 8 (*)    All other components within normal limits  BRAIN NATRIURETIC PEPTIDE - Abnormal; Notable for the following:    B Natriuretic Peptide 503.1 (*)    All other components within normal limits  I-STAT CG4 LACTIC ACID, ED - Abnormal; Notable for the following:    Lactic Acid, Venous 2.03 (*)    All other components within normal limits  CULTURE, BLOOD (ROUTINE X 2)  CULTURE, BLOOD (ROUTINE X 2)  CULTURE, BLOOD (ROUTINE X 2)  CULTURE, BLOOD (ROUTINE X 2)  CULTURE, EXPECTORATED SPUTUM-ASSESSMENT  GRAM STAIN  INFLUENZA PANEL BY PCR (TYPE A & B)  HIV ANTIBODY (ROUTINE TESTING)  STREP PNEUMONIAE URINARY ANTIGEN  CBC  CREATININE, SERUM  LEGIONELLA PNEUMOPHILA SEROGP 1 UR AG  HEMOGLOBIN A1C  TROPONIN I  TROPONIN I  TROPONIN I  I-STAT TROPOININ, ED  I-STAT TROPOININ, ED  I-STAT CG4 LACTIC ACID, ED    EKG  EKG Interpretation  Date/Time:  Saturday September 29 2016 07:42:22 EST Ventricular Rate:  91 PR Interval:    QRS Duration: 95 QT Interval:  355 QTC  Calculation: 437 R Axis:   32 Text Interpretation:  Sinus rhythm No significant change since last tracing Confirmed by [facility] MD, Tobiah Celestine ([id number]) on 09/29/2016 7:58:59 AM       Radiology Dg Chest 2 View  Result Date: 09/29/2016 CLINICAL DATA:  Pt c/o left side chest pain starting this morning. Pt states he has had a dry cough, congestion, headache, dizziness, and intermittent fever for 3 days. Patient reports he missed dialysis Thursday. Hx HTN, diabetes, CAD, nonsmoker. EXAM: CHEST  2 VIEW COMPARISON:  03/14/2015 FINDINGS: There is focal opacity projecting above the minor fissure on the frontal view, which may reside in the posterior right upper lobe or superior segment right lower lobe based on the lateral view. This is new from the prior exam and is consistent with pneumonia. Remainder of the lungs is clear. No pleural effusion or pneumothorax. Cardiac silhouette is normal in size. No mediastinal or hilar masses. No convincing adenopathy. Skeletal structures are intact. IMPRESSION: 1. Small area of pneumonia within the posterior right upper lobe or superior segment right lower lobe. No other acute abnormalities. Electronically Signed   By: Lajean Manes M.D.   On: 09/29/2016 09:05    Procedures Procedures (including critical care time)  Medications Ordered in ED Medications  aspirin chewable tablet 324 mg (324 mg Oral Not Given 09/29/16 0821)  nitroGLYCERIN (NITROSTAT) SL tablet 0.4 mg (not administered)  vancomycin (VANCOCIN) 2,000 mg in sodium chloride 0.9 % 500 mL IVPB (2,000 mg Intravenous New Bag/Given 09/29/16 1050)  vancomycin (VANCOCIN) IVPB 1000 mg/200 mL premix (not administered)  ceFEPIme (MAXIPIME) 2 g in dextrose 5 % 50 mL IVPB (not administered)  cinacalcet (SENSIPAR) tablet 30 mg (not administered)  lanthanum (FOSRENOL) chewable tablet 1,000 mg (not administered)  losartan (COZAAR) tablet 25 mg (not administered)  acetaminophen (TYLENOL) tablet 650 mg (not administered)   ondansetron (ZOFRAN) injection 4 mg (not administered)  heparin injection 5,000 Units (not administered)  sodium chloride flush (NS) 0.9 % injection 3 mL (not administered)  sodium chloride flush (NS) 0.9 % injection 3 mL (not administered)  sodium chloride flush (NS) 0.9 % injection 3 mL (not administered)  0.9 %  sodium chloride infusion (not administered)  insulin aspart (novoLOG) injection 0-9 Units (not  administered)  gi cocktail (Maalox,Lidocaine,Donnatal) (not administered)  guaiFENesin (MUCINEX) 12 hr tablet 600 mg (not administered)  benzonatate (TESSALON) capsule 100 mg (not administered)  aspirin EC tablet 325 mg (not administered)  sodium chloride 0.9 % bolus 500 mL (500 mLs Intravenous New Bag/Given 09/29/16 1051)  ceFEPIme (MAXIPIME) 2 g in dextrose 5 % 50 mL IVPB (0 g Intravenous Stopped 09/29/16 1026)     Initial Impression / Assessment and Plan / ED Course  I have reviewed the triage vital signs and the nursing notes.  Pertinent labs & imaging results that were available during my care of the patient were reviewed by me and considered in my medical decision making (see chart for details).     56 year old male with a history of ESRD on dialysis Tuesday Thursday Saturday, chronic diastolic congestive heart failure, hypertension, diabetes, IgA monoclonal gammopathy who presents with concern for chest pain. Patient also reports he's had 3 days of elevated temperature, cough and generalized weakness.   Regarding cough, generalized weakness, reported fevers at home, patient is afebrile here. Chest x-ray shows RUL pneumonia. Influenza PCR done and negative.  Blood cx done and patient given vancomycin/cefepime. Lactic acid 2.03, may be dehydration, possible sepsis, however given very mild elevation, no blood pressure changes, will give 500cc NS.   Patient reports the chest pain as a separate issue. While MSK CP in setting of pneumonia is on differential, his history of exertional  CP followed by CP at rest beginning today is concerning. He has not had prior cardiac work up.  EKG without acute findings. Doubt dissection given progressive exertional hx, normal pulses bilaterally, no radiation to back and mediastinum normal on XR.  No asymmetric leg swelling or hypoxia to suggest PE and feel progressive hx is less concerning for this.  Given ASA, nitro SL. Troponin negative.   Pt had 1hr of dialysis on Thursday, missed today with K WNL. Dr. Melvia Heaps Nephrology aware of pt.   Final Clinical Impressions(s) / ED Diagnoses   Final diagnoses:  HCAP (healthcare-associated pneumonia)  Chest pain, unspecified type    New Prescriptions New Prescriptions   No medications on file     Gareth Morgan, MD 09/29/16 1124

## 2016-09-29 NOTE — ED Notes (Signed)
MD at bedside. 

## 2016-09-29 NOTE — Progress Notes (Signed)
Patient admitted to unit this shift forHCAP, no c/o shortness of breath or chest pain since admission.  Elevated troponin at 0.07, MD notified.  Troponins x 2 pending.  Patient off unit receiving dialysis at this time.

## 2016-09-30 ENCOUNTER — Inpatient Hospital Stay (HOSPITAL_COMMUNITY): Payer: Medicare Other

## 2016-09-30 ENCOUNTER — Encounter (HOSPITAL_COMMUNITY): Payer: Self-pay | Admitting: *Deleted

## 2016-09-30 DIAGNOSIS — R079 Chest pain, unspecified: Secondary | ICD-10-CM

## 2016-09-30 DIAGNOSIS — R748 Abnormal levels of other serum enzymes: Secondary | ICD-10-CM

## 2016-09-30 DIAGNOSIS — J189 Pneumonia, unspecified organism: Principal | ICD-10-CM

## 2016-09-30 DIAGNOSIS — Z992 Dependence on renal dialysis: Secondary | ICD-10-CM

## 2016-09-30 DIAGNOSIS — I517 Cardiomegaly: Secondary | ICD-10-CM

## 2016-09-30 DIAGNOSIS — R7989 Other specified abnormal findings of blood chemistry: Secondary | ICD-10-CM

## 2016-09-30 DIAGNOSIS — I10 Essential (primary) hypertension: Secondary | ICD-10-CM

## 2016-09-30 DIAGNOSIS — I5032 Chronic diastolic (congestive) heart failure: Secondary | ICD-10-CM

## 2016-09-30 DIAGNOSIS — I119 Hypertensive heart disease without heart failure: Secondary | ICD-10-CM

## 2016-09-30 DIAGNOSIS — N186 End stage renal disease: Secondary | ICD-10-CM

## 2016-09-30 DIAGNOSIS — I43 Cardiomyopathy in diseases classified elsewhere: Secondary | ICD-10-CM

## 2016-09-30 DIAGNOSIS — N184 Chronic kidney disease, stage 4 (severe): Secondary | ICD-10-CM

## 2016-09-30 DIAGNOSIS — R778 Other specified abnormalities of plasma proteins: Secondary | ICD-10-CM

## 2016-09-30 DIAGNOSIS — D631 Anemia in chronic kidney disease: Secondary | ICD-10-CM

## 2016-09-30 LAB — LIPID PANEL
CHOL/HDL RATIO: 3.6 ratio
Cholesterol: 126 mg/dL (ref 0–200)
HDL: 35 mg/dL — ABNORMAL LOW (ref >40)
LDL CALC: 72 mg/dL (ref 0–99)
TRIGLYCERIDES: 95 mg/dL (ref <150)
VLDL: 19 mg/dL (ref 0–40)

## 2016-09-30 LAB — HEMOGLOBIN A1C
HEMOGLOBIN A1C: 7.1 % — AB (ref 4.8–5.6)
Mean Plasma Glucose: 157 mg/dL

## 2016-09-30 LAB — BASIC METABOLIC PANEL
ANION GAP: 12 (ref 5–15)
BUN: 39 mg/dL — ABNORMAL HIGH (ref 6–20)
CO2: 24 mmol/L (ref 22–32)
Calcium: 8.2 mg/dL — ABNORMAL LOW (ref 8.9–10.3)
Chloride: 97 mmol/L — ABNORMAL LOW (ref 101–111)
Creatinine, Ser: 6.39 mg/dL — ABNORMAL HIGH (ref 0.61–1.24)
GFR calc non Af Amer: 9 mL/min — ABNORMAL LOW (ref >60)
GFR, EST AFRICAN AMERICAN: 10 mL/min — AB (ref >60)
Glucose, Bld: 133 mg/dL — ABNORMAL HIGH (ref 65–99)
POTASSIUM: 3.8 mmol/L (ref 3.5–5.1)
SODIUM: 133 mmol/L — AB (ref 135–145)

## 2016-09-30 LAB — GLUCOSE, CAPILLARY
GLUCOSE-CAPILLARY: 106 mg/dL — AB (ref 65–99)
Glucose-Capillary: 191 mg/dL — ABNORMAL HIGH (ref 65–99)
Glucose-Capillary: 140 mg/dL — ABNORMAL HIGH (ref 65–99)
Glucose-Capillary: 136 mg/dL — ABNORMAL HIGH (ref 65–99)

## 2016-09-30 LAB — CBC
HEMATOCRIT: 30 % — AB (ref 39.0–52.0)
Hemoglobin: 10 g/dL — ABNORMAL LOW (ref 13.0–17.0)
MCH: 32.5 pg (ref 26.0–34.0)
MCHC: 33.3 g/dL (ref 30.0–36.0)
MCV: 97.4 fL (ref 78.0–100.0)
Platelets: 248 10*3/uL (ref 150–400)
RBC: 3.08 MIL/uL — AB (ref 4.22–5.81)
RDW: 15 % (ref 11.5–15.5)
WBC: 9.3 10*3/uL (ref 4.0–10.5)

## 2016-09-30 LAB — ECHOCARDIOGRAM COMPLETE
HEIGHTINCHES: 71 in
WEIGHTICAEL: 3513.25 [oz_av]

## 2016-09-30 LAB — HIV ANTIBODY (ROUTINE TESTING W REFLEX): HIV SCREEN 4TH GENERATION: NONREACTIVE

## 2016-09-30 MED ORDER — FLUTICASONE PROPIONATE 50 MCG/ACT NA SUSP
1.0000 | Freq: Every day | NASAL | Status: DC
  Administered 2016-09-30 – 2016-10-02 (×3): 1 via NASAL
  Filled 2016-09-30 (×2): qty 16

## 2016-09-30 NOTE — Progress Notes (Signed)
  Echocardiogram 2D Echocardiogram has been performed.  [name] 09/30/2016, 10:13 AM

## 2016-09-30 NOTE — Progress Notes (Signed)
VSS during 7 a to  7 p shift.  Patients only real complaint is cough for which he is receiving Tessalon Perles and Mucinex.  Does c/o intermittent chest wall tenderness when he has a severe coughing spell.  No respiratory distress noted, using Flonase for "stuffy nose" and not requiring any oxygen.  Will continue to monitor.

## 2016-09-30 NOTE — Progress Notes (Signed)
Patient c/o coughing, tessalon perles administered.  Also receiving Mucinex.  Patient states he only has chest pain when he has a severe coughing bout.  Also states he coughed so much he vomited earlier.

## 2016-09-30 NOTE — Consult Note (Signed)
Patient ID: [name] MRN: [id number] DOB/AGE: 06-May-1961 56 y.o.  Admit date: 09/29/2016 Primary Physician Minerva Ends, MD  Primary Cardiologist Dr. Mar Daring   Chief Complaint  Chest pain  HPI: [name] is a 56M with ESRD on HD, hypertension, chronic diastolic heart failure here with chest pain and pneumonia.  He reports that for the last several days he has noted substernal chest pain.  He also endorses fever, chills, and productive cough.  He has chest pain at rest but there is also a pleuritic quality.  He denies nausea but did have post-tussive emesis.  Of note, his daughter recently had the flu but he was negative for the flu.  He was noted noted to have a RUL and RLL pneumonia.  [name] saw Dr. Bridgett Larsson 09/21/16 and reported chest pain.  He was referred for a cardiology appointment scheduled 10/04/15.  Echo this admission shows LVEF 60% with grade 1 diastolic dysfunction.  It also showed severe septal hypertrophy with moderate hypertrophy of the posterior wall.  This was significantly different than his echo in 2016, at which time he had mild LVH.  He denies lower extremity edema, orthopnea, PND, palpitations, lightheadedness or dizziness.   [name], is a patient of Dr. Verl Blalock and last saw him 04/2015.  Review of Systems: A 12 point review of systems was obtained and was negative with exceptions noted in the HPI.     Past Medical History:  Diagnosis Date  . Anemia   . Cellulitis and abscess of foot 08/24/2011  . CHF (congestive heart failure) ([place]) 2015  . Chronic renal disease, stage IV ([place]) 2015  . Cocaine abuse   . Constipation   . Diabetes mellitus 2015   type 2  . Diastolic congestive heart failure ([place])   . GERD (gastroesophageal reflux disease)    unsure  . Headache   . Hypertension 2015  . Multiple myeloma ([place])   . Protein calorie malnutrition ([place])   . PVD (peripheral vascular disease) ([place])   . Shortness of  breath dyspnea     Medications Prior to Admission  Medication Sig Dispense Refill  . cinacalcet (SENSIPAR) 30 MG tablet Take 30 mg by mouth at bedtime. Reported on 12/21/2015    . lanthanum (FOSRENOL) 1000 MG chewable tablet Chew 1,000 mg by mouth 3 (three) times daily with meals. Reported on 12/21/2015    . diclofenac sodium (VOLTAREN) 1 % GEL Apply 4 g topically 4 (four) times daily. (Patient not taking: Reported on 09/29/2016) 100 g 5  . losartan (COZAAR) 25 MG tablet Take 25 mg by mouth daily. Reported on 12/21/2015       . aspirin  324 mg Oral Once  . aspirin EC  325 mg Oral Daily  . ceFEPime (MAXIPIME) IV  2 g Intravenous Q T,Th,Sa-HD  . cinacalcet  30 mg Oral Q supper  . [START ON 10/02/2016] doxercalciferol  3 mcg Intravenous Q T,Th,Sa-HD  . fluticasone  1 spray Each Nare Daily  . guaiFENesin  600 mg Oral BID  . heparin  5,000 Units Subcutaneous Q8H  . insulin aspart  0-9 Units Subcutaneous TID WC  . lanthanum  1,000 mg Oral TID WC  . losartan  25 mg Oral Daily  . multivitamin  1 tablet Oral QHS  . sodium chloride flush  3 mL Intravenous Q12H  . sodium chloride flush  3 mL Intravenous Q12H  . vancomycin  1,000 mg Intravenous Q T,Th,Sa-HD    Infusions:  No Known Allergies  Social History   Social History  . Marital status: Legally Separated    Spouse name: N/A  . Number of children: N/A  . Years of education: N/A   Occupational History  . Not on file.   Social History Main Topics  . Smoking status: Never Smoker  . Smokeless tobacco: Never Used  . Alcohol use No     Comment: once in a while per patient "beer/brandy"  . Drug use: No     Comment: per pt* smoked marijuana as a teenager and last cocaine use was two months ago  . Sexual activity: No   Other Topics Concern  . Not on file   Social History Narrative  . No narrative on file    Family History  Problem Relation Age of Onset  . Adopted: Yes  . Varicose Veins Sister     PHYSICAL EXAM: Vitals:     09/30/16 0527 09/30/16 0752  BP: 125/76 116/61  Pulse: 94 87  Resp: 20 20  Temp: 98.9 F (37.2 C) 98.9 F (37.2 C)     Intake/Output Summary (Last 24 hours) at 09/30/16 1420 Last data filed at 09/30/16 1407  Gross per 24 hour  Intake             1080 ml  Output             4500 ml  Net            -3420 ml    General:  Well appearing. No respiratory difficulty HEENT: normal Neck: supple. no JVD. Carotids 2+ bilat; no bruits. No lymphadenopathy or thryomegaly appreciated. Cor: PMI nondisplaced. Regular rate & rhythm. No rubs, gallops or murmurs. Lungs: Diffuse rhonchi and expiratory wheezes.  Poor air movement.   Abdomen: soft, nontender, nondistended. No hepatosplenomegaly. No bruits or masses. Good bowel sounds. Extremities: no cyanosis, clubbing, rash, edema Neuro: alert & oriented x 3, cranial nerves grossly intact. moves all 4 extremities w/o difficulty. Affect pleasant.  Results for orders placed or performed during the hospital encounter of 09/29/16 (from the past 24 hour(s))  Glucose, capillary     Status: Abnormal   Collection Time: 09/29/16  4:50 PM  Result Value Ref Range   Glucose-Capillary 146 (H) 65 - 99 mg/dL   Comment 1 Notify RN   Glucose, capillary     Status: Abnormal   Collection Time: 09/29/16 10:33 PM  Result Value Ref Range   Glucose-Capillary 188 (H) 65 - 99 mg/dL  Troponin I-serum (0, 3, 6 hours)     Status: Abnormal   Collection Time: 09/29/16 10:41 PM  Result Value Ref Range   Troponin I 0.05 (HH) <0.03 ng/mL  Basic metabolic panel     Status: Abnormal   Collection Time: 09/30/16  4:51 AM  Result Value Ref Range   Sodium 133 (L) 135 - 145 mmol/L   Potassium 3.8 3.5 - 5.1 mmol/L   Chloride 97 (L) 101 - 111 mmol/L   CO2 24 22 - 32 mmol/L   Glucose, Bld 133 (H) 65 - 99 mg/dL   BUN 39 (H) 6 - 20 mg/dL   Creatinine, Ser 6.39 (H) 0.61 - 1.24 mg/dL   Calcium 8.2 (L) 8.9 - 10.3 mg/dL   GFR calc non Af Amer 9 (L) >60 mL/min   GFR calc Af Amer 10  (L) >60 mL/min   Anion gap 12 5 - 15  CBC     Status: Abnormal   Collection Time: 09/30/16  4:51 AM  Result Value Ref Range   WBC 9.3 4.0 - 10.5 K/uL   RBC 3.08 (L) 4.22 - 5.81 MIL/uL   Hemoglobin 10.0 (L) 13.0 - 17.0 g/dL   HCT 30.0 (L) 39.0 - 52.0 %   MCV 97.4 78.0 - 100.0 fL   MCH 32.5 26.0 - 34.0 pg   MCHC 33.3 30.0 - 36.0 g/dL   RDW 15.0 11.5 - 15.5 %   Platelets 248 150 - 400 K/uL  Lipid panel     Status: Abnormal   Collection Time: 09/30/16  4:51 AM  Result Value Ref Range   Cholesterol 126 0 - 200 mg/dL   Triglycerides 95 <150 mg/dL   HDL 35 (L) >40 mg/dL   Total CHOL/HDL Ratio 3.6 RATIO   VLDL 19 0 - 40 mg/dL   LDL Cholesterol 72 0 - 99 mg/dL  Glucose, capillary     Status: Abnormal   Collection Time: 09/30/16  6:26 AM  Result Value Ref Range   Glucose-Capillary 106 (H) 65 - 99 mg/dL  Glucose, capillary     Status: Abnormal   Collection Time: 09/30/16 11:33 AM  Result Value Ref Range   Glucose-Capillary 191 (H) 65 - 99 mg/dL   Comment 1 Notify RN    Dg Chest 2 View  Result Date: 09/29/2016 CLINICAL DATA:  Pt c/o left side chest pain starting this morning. Pt states he has had a dry cough, congestion, headache, dizziness, and intermittent fever for 3 days. Patient reports he missed dialysis Thursday. Hx HTN, diabetes, CAD, nonsmoker. EXAM: CHEST  2 VIEW COMPARISON:  03/14/2015 FINDINGS: There is focal opacity projecting above the minor fissure on the frontal view, which may reside in the posterior right upper lobe or superior segment right lower lobe based on the lateral view. This is new from the prior exam and is consistent with pneumonia. Remainder of the lungs is clear. No pleural effusion or pneumothorax. Cardiac silhouette is normal in size. No mediastinal or hilar masses. No convincing adenopathy. Skeletal structures are intact. IMPRESSION: 1. Small area of pneumonia within the posterior right upper lobe or superior segment right lower lobe. No other acute  abnormalities. Electronically Signed   By: Lajean Manes M.D.   On: 09/29/2016 09:05    ECG: Sinus rhythm.  Rate 93 bpm.   Echo 09/30/16: Study Conclusions  - Left ventricle: The cavity size was normal. Systolic function was   normal. The estimated ejection fraction was 60%. Moderate   concentric and severe focal basal septal hypertrophy. Doppler   parameters are consistent with abnormal left ventricular   relaxation (grade 1 diastolic dysfunction). - Regional wall motion abnormality: Mild hypokinesis of the mid   anteroseptal myocardium. - Aortic valve: Mildly to moderately calcified annulus. Trileaflet. - Mitral valve: Mildly thickened leaflets . There was moderate   regurgitation. - Left atrium: The atrium was moderately to severely dilated.  Echo 03/17/15: Study Conclusions  - Left ventricle: The cavity size was normal. Systolic function was   normal. The estimated ejection fraction was 60%. Moderate   concentric and severe focal basal septal hypertrophy. Doppler   parameters are consistent with abnormal left ventricular   relaxation (grade 1 diastolic dysfunction). - Regional wall motion abnormality: Mild hypokinesis of the mid   anteroseptal myocardium. - Aortic valve: Mildly to moderately calcified annulus. Trileaflet. - Mitral valve: Mildly thickened leaflets . There was moderate   regurgitation. - Left atrium: The atrium was moderately to severely dilated.   ASSESSMENT/PLAN:  #  Chest pain: # Elevated troponin: [name] has atypical chest pain in the setting of HCAP.  Troponin very mildly eelvated to 0.07, which is lower than when last checked in 2016.  EKG is without findings of ischemia.  Will plan for outpatient follow up and likely stress testing.  Continue aspirin.  LDL 72 this admission.  # Chronic diastolic heart failure: Currently euvolemic.  Echo shows moderate hypertrophy of the posterior wall and severe septal hypertrophy.  However, on my  independent review of his echo, it appears that he has mild-moderate concentric hypertrophy. There was no evidence of SAM or increased LVOT gradients.  Will not do this in the setting of acute illness and wheezing.    # Hypertension: BP well-controlled on losartan.    Signed: Patsey Pitstick C. Oval Linsey, MD, [facility]  09/30/2016, 2:20 PM

## 2016-09-30 NOTE — Progress Notes (Signed)
  [name] Progress Note   Subjective: feeling better. No new c/o.   Vitals:   09/29/16 2228 09/30/16 0046 09/30/16 0527 09/30/16 0752  BP: (!) 103/59 107/63 125/76 116/61  Pulse: 100 96 94 87  Resp: 20 20 20 20   Temp: 99.4 F (37.4 C) 98.4 F (36.9 C) 98.9 F (37.2 C) 98.9 F (37.2 C)  TempSrc: Oral Oral Oral Oral  SpO2: 95% 92% 99% 97%  Weight:   99.6 kg (219 lb 9.3 oz)   Height:        Inpatient medications: . aspirin  324 mg Oral Once  . aspirin EC  325 mg Oral Daily  . ceFEPime (MAXIPIME) IV  2 g Intravenous Q T,Th,Sa-HD  . cinacalcet  30 mg Oral Q supper  . [START ON 10/02/2016] doxercalciferol  3 mcg Intravenous Q T,Th,Sa-HD  . fluticasone  1 spray Each Nare Daily  . guaiFENesin  600 mg Oral BID  . heparin  5,000 Units Subcutaneous Q8H  . insulin aspart  0-9 Units Subcutaneous TID WC  . lanthanum  1,000 mg Oral TID WC  . losartan  25 mg Oral Daily  . multivitamin  1 tablet Oral QHS  . sodium chloride flush  3 mL Intravenous Q12H  . sodium chloride flush  3 mL Intravenous Q12H  . vancomycin  1,000 mg Intravenous Q T,Th,Sa-HD    sodium chloride, acetaminophen, benzonatate, gi cocktail, nitroGLYCERIN, ondansetron (ZOFRAN) IV, sodium chloride flush  Exam: Alert no distress, calm No jvd Chest occ rhonchi, bases clear RRR no mrg Abd soft ntnd no ascites Ext no edema NF, Ox 3 RUA AVF+bruit  D[facility]: [facility]  4h  90kg  2/2.5 bath  Hep 6000 +2045mid  RUA AVF  - venofer 50  - M50 every 4wks, last 1.16  - Hect 2 ug      Assessment: 1. HCAP/ chest pain - imrpoving 2. ESRD TTS HD 3. Volume - close to dry wt after HD last night 4. MBD no chgs 5. Psychosocial - per primary 6. Nutrition - no chg  Plan - no new suggestions   Kelly Splinter MD [facility] pager [phone]   09/30/2016, 12:57 PM    Recent Labs Lab 09/29/16 0820 09/30/16 0451  NA 136 133*  K 4.3 3.8  CL 103 97*  CO2 21* 24  GLUCOSE 195* 133*  BUN  50* 39*  CREATININE 7.59* 6.39*  CALCIUM 9.0 8.2*    Recent Labs Lab 09/29/16 0820  AST 18  ALT 14*  ALKPHOS 72  BILITOT 0.9  PROT 6.8  ALBUMIN 3.0*    Recent Labs Lab 09/29/16 0820 09/29/16 1408 09/30/16 0451  WBC 8.9 8.3 9.3  NEUTROABS 6.3  --   --   HGB 10.2* 11.2* 10.0*  HCT 30.8* 33.9* 30.0*  MCV 98.4 99.1 97.4  PLT 304 314 248   Iron/TIBC/Ferritin/ %Sat    Component Value Date/Time   IRON 38 (L) 01/27/2015 0300   TIBC 185 (L) 01/27/2015 0300   FERRITIN 316 01/27/2015 0300   IRONPCTSAT 21 01/27/2015 0300

## 2016-09-30 NOTE — Plan of Care (Signed)
Problem: Respiratory: Goal: Identification of resources available to assist in meeting health care needs will improve Outcome: Progressing Consult put in for social work

## 2016-09-30 NOTE — Progress Notes (Signed)
[facility] PROGRESS NOTE  [name] [id number] DOB: Apr 11, 1961 DOA: 09/29/2016  PCP: Minerva Ends, MD  Brief History/Interval Summary: 56 year old male with a past medical history of end-stage renal disease on hemodialysis, anemia, hypertension, diabetes, GERD, presented with complaints of chest pain, cough. Chest pain had been present on and off  for about 2 weeks. Patient has an appointment to see a cardiologist on the 31st. However, due to worsening symptoms, he decided to come in to the hospital. Patient was found to have pneumonia. He was hospitalized for further management.  Reason for Visit: Healthcare associated pneumonia. Chest pain  Consultants: Cardiology. Nephrology  Procedures: Hemodialysis  Transthoracic echocardiogram Study Conclusions - Left ventricle: The cavity size was normal. Systolic function was   normal. The estimated ejection fraction was 60%. Moderate   concentric and severe focal basal septal hypertrophy. Doppler   parameters are consistent with abnormal left ventricular   relaxation (grade 1 diastolic dysfunction). - Regional wall motion abnormality: Mild hypokinesis of the mid   anteroseptal myocardium. - Aortic valve: Mildly to moderately calcified annulus. Trileaflet. - Mitral valve: Mildly thickened leaflets . There was moderate   regurgitation. - Left atrium: The atrium was moderately to severely dilated.  Antibiotics: Vancomycin and cefepime  Subjective/Interval History: Patient states that he does not have any chest pain currently. Still has a cough. Denies any shortness of breath.  ROS: Denies any nausea or vomiting.  Objective:  Vital Signs  Vitals:   09/29/16 2228 09/30/16 0046 09/30/16 0527 09/30/16 0752  BP: (!) 103/59 107/63 125/76 116/61  Pulse: 100 96 94 87  Resp: 20 20 20 20   Temp: 99.4 F (37.4 C) 98.4 F (36.9 C) 98.9 F (37.2 C) 98.9 F (37.2 C)  TempSrc: Oral Oral Oral Oral  SpO2: 95%  92% 99% 97%  Weight:   99.6 kg (219 lb 9.3 oz)   Height:        Intake/Output Summary (Last 24 hours) at 09/30/16 1444 Last data filed at 09/30/16 1407  Gross per 24 hour  Intake              840 ml  Output             4500 ml  Net            -3660 ml   Filed Weights   09/29/16 1740 09/29/16 2116 09/30/16 0527  Weight: 95.2 kg (209 lb 14.1 oz) 91.2 kg (201 lb 1 oz) 99.6 kg (219 lb 9.3 oz)    General appearance: alert, cooperative, appears stated age and no distress Resp: Coarse breath sounds bilaterally. No crackles, wheezing or rhonchi. Cardio: regular rate and rhythm, S1, S2 normal, no murmur, click, rub or gallop GI: soft, non-tender; bowel sounds normal; no masses,  no organomegaly Extremities: extremities normal, atraumatic, no cyanosis or edema Neurologic: Awake and alert. Oriented 3. No focal neurological deficits.  Lab Results:  Data Reviewed: I have personally reviewed following labs and imaging studies  CBC:  Recent Labs Lab 09/29/16 0820 09/29/16 1408 09/30/16 0451  WBC 8.9 8.3 9.3  NEUTROABS 6.3  --   --   HGB 10.2* 11.2* 10.0*  HCT 30.8* 33.9* 30.0*  MCV 98.4 99.1 97.4  PLT 304 314 [id number]    Basic Metabolic Panel:  Recent Labs Lab 09/29/16 0820 09/30/16 0451  NA 136 133*  K 4.3 3.8  CL 103 97*  CO2 21* 24  GLUCOSE 195* 133*  BUN 50* 39*  CREATININE 7.59*  6.39*  CALCIUM 9.0 8.2*    GFR: Estimated Creatinine Clearance: 15.7 mL/min (by C-G formula based on SCr of 6.39 mg/dL (H)).  Liver Function Tests:  Recent Labs Lab 09/29/16 0820  AST 18  ALT 14*  ALKPHOS 72  BILITOT 0.9  PROT 6.8  ALBUMIN 3.0*    Cardiac Enzymes:  Recent Labs Lab 09/29/16 1408 09/29/16 2241  TROPONINI 0.07* 0.05*   HbA1C:  Recent Labs  09/29/16 1409  HGBA1C 7.1*    CBG:  Recent Labs Lab 09/29/16 1217 09/29/16 1650 09/29/16 2233 09/30/16 0626 09/30/16 1133  GLUCAP 86 146* 188* 106* 191*    Lipid Profile:  Recent Labs  09/30/16 0451    CHOL 126  HDL 35*  LDLCALC 72  TRIG 95  CHOLHDL 3.6     Recent Results (from the past 240 hour(s))  Blood culture (routine x 2)     Status: None (Preliminary result)   Collection Time: 09/29/16  9:45 AM  Result Value Ref Range Status   Specimen Description BLOOD RIGHT HAND  Final   Special Requests BOTTLES DRAWN AEROBIC AND ANAEROBIC 5CC  Final   Culture NO GROWTH 1 DAY  Final   Report Status PENDING  Incomplete  Blood culture (routine x 2)     Status: None (Preliminary result)   Collection Time: 09/29/16  9:50 AM  Result Value Ref Range Status   Specimen Description BLOOD LEFT ANTECUBITAL  Final   Special Requests BOTTLES DRAWN AEROBIC AND ANAEROBIC  5CC  Final   Culture NO GROWTH 1 DAY  Final   Report Status PENDING  Incomplete      Radiology Studies: Dg Chest 2 View  Result Date: 09/29/2016 CLINICAL DATA:  Pt c/o left side chest pain starting this morning. Pt states he has had a dry cough, congestion, headache, dizziness, and intermittent fever for 3 days. Patient reports he missed dialysis Thursday. Hx HTN, diabetes, CAD, nonsmoker. EXAM: CHEST  2 VIEW COMPARISON:  03/14/2015 FINDINGS: There is focal opacity projecting above the minor fissure on the frontal view, which may reside in the posterior right upper lobe or superior segment right lower lobe based on the lateral view. This is new from the prior exam and is consistent with pneumonia. Remainder of the lungs is clear. No pleural effusion or pneumothorax. Cardiac silhouette is normal in size. No mediastinal or hilar masses. No convincing adenopathy. Skeletal structures are intact. IMPRESSION: 1. Small area of pneumonia within the posterior right upper lobe or superior segment right lower lobe. No other acute abnormalities. Electronically Signed   By: Lajean Manes M.D.   On: 09/29/2016 09:05     Medications:  Scheduled: . aspirin  324 mg Oral Once  . aspirin EC  325 mg Oral Daily  . ceFEPime (MAXIPIME) IV  2 g  Intravenous Q T,Th,Sa-HD  . cinacalcet  30 mg Oral Q supper  . [START ON 10/02/2016] doxercalciferol  3 mcg Intravenous Q T,Th,Sa-HD  . fluticasone  1 spray Each Nare Daily  . guaiFENesin  600 mg Oral BID  . heparin  5,000 Units Subcutaneous Q8H  . insulin aspart  0-9 Units Subcutaneous TID WC  . lanthanum  1,000 mg Oral TID WC  . losartan  25 mg Oral Daily  . multivitamin  1 tablet Oral QHS  . sodium chloride flush  3 mL Intravenous Q12H  . sodium chloride flush  3 mL Intravenous Q12H  . vancomycin  1,000 mg Intravenous Q T,Th,Sa-HD   Continuous:  [id number]  chloride, acetaminophen, benzonatate, gi cocktail, nitroGLYCERIN, ondansetron (ZOFRAN) IV, sodium chloride flush  Assessment/Plan:  Principal Problem:   HCAP (healthcare-associated pneumonia) Active Problems:   Anemia of chronic renal failure, stage 4 (severe) (HCC)   Type 2 diabetes, controlled, with renal manifestation (HCC)   End stage renal disease on dialysis [facility])   Chest pain    Healthcare associated pneumonia  Patient placed on vancomycin and cefepime. Follow up on blood cultures. Seems to have improved. Influenza PCR was negative. If he continues to improve, will change him to oral antibiotics tomorrow. HIV nonreactive. Strep pneumo urinary antigen negative.  Chest pain Etiology for his chest pain is not entirely clear. He did mention that his chest pain occurred when he was walking to his work. He mentioned pressure-like sensation. However, he also had cough. According to the admitting physician's note, there was some pleuritic component and musculoskeletal component to this pain. Apparently this has been ongoing for about 2 weeks on and off. He is supposed to see cardiologist on the 31st. He does have a slight bump in troponin, which could be due to renal failure, but it is difficult to be certain. EKG with some T wave changes. Echocardiogram as above. There is some wall motion abnormalities. It might be reasonable  to obtain cardiology input while he is in the hospital. LDL 72.  End-stage renal disease on hemodialysis on Tuesday, Thursday, Saturday Seen by nephrology. Dialyzed yesterday. Stable. Patient to undergo a vascular surgery procedure to his AV fistula, once cleared by cardiology.  Diabetes mellitus type 2 Monitor CBGs. Sliding scale coverage. HbA1c 7.1. Not noted to be on any glucose lowering agents at home. Will need to verify with him.  Essential hypertension. Continue home medications.  Normocytic anemia. Most likely due to chronic kidney disease. Monitor hemoglobin. No evidence for overt bleeding.  DVT Prophylaxis: Subcutaneous heparin    Code Status: Full code  Family Communication: Discussed with the patient  Disposition Plan: Management as outlined above. Await cardiology input.     LOS: 1 day   [facility] Pager [phone] 09/30/2016, 2:44 PM  If 7PM-7AM, please contact night-coverage at [email or web address], password [facility]

## 2016-10-01 ENCOUNTER — Inpatient Hospital Stay (HOSPITAL_COMMUNITY): Payer: Medicare Other

## 2016-10-01 ENCOUNTER — Encounter (HOSPITAL_COMMUNITY): Payer: Self-pay | Admitting: Radiology

## 2016-10-01 LAB — BASIC METABOLIC PANEL
Anion gap: 12 (ref 5–15)
BUN: 54 mg/dL — AB (ref 6–20)
CALCIUM: 8.3 mg/dL — AB (ref 8.9–10.3)
CO2: 24 mmol/L (ref 22–32)
Chloride: 97 mmol/L — ABNORMAL LOW (ref 101–111)
Creatinine, Ser: 7.56 mg/dL — ABNORMAL HIGH (ref 0.61–1.24)
GFR calc Af Amer: 8 mL/min — ABNORMAL LOW (ref >60)
GFR, EST NON AFRICAN AMERICAN: 7 mL/min — AB (ref >60)
GLUCOSE: 129 mg/dL — AB (ref 65–99)
Potassium: 3.6 mmol/L (ref 3.5–5.1)
Sodium: 133 mmol/L — ABNORMAL LOW (ref 135–145)

## 2016-10-01 LAB — GLUCOSE, CAPILLARY
GLUCOSE-CAPILLARY: 182 mg/dL — AB (ref 65–99)
Glucose-Capillary: 124 mg/dL — ABNORMAL HIGH (ref 65–99)
Glucose-Capillary: 182 mg/dL — ABNORMAL HIGH (ref 65–99)
Glucose-Capillary: 113 mg/dL — ABNORMAL HIGH (ref 65–99)

## 2016-10-01 LAB — CBC
HCT: 29.8 % — ABNORMAL LOW (ref 39.0–52.0)
Hemoglobin: 9.9 g/dL — ABNORMAL LOW (ref 13.0–17.0)
MCH: 32 pg (ref 26.0–34.0)
MCHC: 33.2 g/dL (ref 30.0–36.0)
MCV: 96.4 fL (ref 78.0–100.0)
PLATELETS: 246 10*3/uL (ref 150–400)
RBC: 3.09 MIL/uL — ABNORMAL LOW (ref 4.22–5.81)
RDW: 14.8 % (ref 11.5–15.5)
WBC: 7.5 10*3/uL (ref 4.0–10.5)

## 2016-10-01 LAB — LEGIONELLA PNEUMOPHILA SEROGP 1 UR AG: L. pneumophila Serogp 1 Ur Ag: NEGATIVE

## 2016-10-01 MED ORDER — LEVOFLOXACIN 750 MG PO TABS
750.0000 mg | ORAL_TABLET | Freq: Once | ORAL | Status: AC
  Administered 2016-10-01: 750 mg via ORAL
  Filled 2016-10-01: qty 1

## 2016-10-01 MED ORDER — TECHNETIUM TO 99M ALBUMIN AGGREGATED
4.3000 | Freq: Once | INTRAVENOUS | Status: AC | PRN
  Administered 2016-10-01: 4.3 via INTRAVENOUS

## 2016-10-01 MED ORDER — IOPAMIDOL (ISOVUE-370) INJECTION 76%
INTRAVENOUS | Status: AC
  Administered 2016-10-01: 80 mL
  Filled 2016-10-01: qty 100

## 2016-10-01 MED ORDER — TECHNETIUM TC 99M DIETHYLENETRIAME-PENTAACETIC ACID: 27.9000 | Freq: Once | INTRAVENOUS | Status: DC | PRN

## 2016-10-01 MED ORDER — LEVOFLOXACIN 500 MG PO TABS: 500.0000 mg | ORAL_TABLET | ORAL | Status: DC

## 2016-10-01 NOTE — Progress Notes (Signed)
[facility] Progress Note   Dialysis Orders: TTS [place] 4 hr EDW 90 2K 2.5 Ca AVF heparin 6000 with 2000 mid tmt venofer 50 per week Mircera 50 q 4 weeks last 1/16 hectorol 2 right upper AVF Recent labs: hgb 11 stable 41% sat ferritin 1214 iPTH 717 corr Ca 9.8 P 5.9 Access issues: evaluated by Dr. Bridgett Larsson 1/19 - has stenotic prox ceph vein - needs turndown procedure - but needs cardiology eval first due to angina like sx  Assessment/Plan: 1.  HCAP with chest pain - vanc and cefepime per primary, cultures pending/work up per primary; does not look acutely ill; [facility]VS wants cardiology eval before surgery  Had appt 1/31 - may be done while in hosp per primary note yesterday 2.  ESRD -  TTS - next HD Tuesday K 3.6 - start on 3 K bath tomorrow with HD  3.  Hypertension/volume  - BP controlled with volume removal and low dose losartan- net UF 4 L Sat - post st 91.2- BP somewhat variable - get orthostatics pre and post HD- goal for EDW 4.  Anemia  - hgb 9.9 down some from outpt labs - follow - stop Fe for now due to ^ ferritin and adequate tsat - not due for ESA for 2 weeks - follow trending - if hgb drops further can redose sooner 5.  Metabolic bone disease -  Continue hectorol -- actually need to ^ hectorol to 3 and change bath to 2.25 at discharge; continue sensipar and fosrenol 6.  Nutrition - renal diet/vits           7.    Significant psycho social stressors outpt dialysis SW is aware   Myriam Jacobson, PA-C [place] [phone] 10/01/2016,10:49 AM  LOS: 2 days   10/01/16 12pm Patient seen and examined. Agree with PA A/P. No longer has CP and feels much better. Needs cardiac clearance prior to cephalic arch turndown on the right side by Dr. Bridgett Larsson. Plan on HD in the AM to facilitate possible d/c tomorrow. Unsure where he's going afterwards as the former partner has not signed the lease.  Otelia Santee, MD [facility]  Subjective:   CP mostly with cough.  Trying to work  out personal situation with probably d/c to [facility] for shelter - has stayed there in the past and also [facility] for 19 months. Financial issues and no transportation as well  Objective Vitals:   09/30/16 1618 09/30/16 2041 10/01/16 0615 10/01/16 0747  BP: (!) 149/78 (!) 160/87 126/76 (!) 152/81  Pulse: 91 97 91 80  Resp: 20 19 20 18   Temp: 99.1 F (37.3 C) 98.5 F (36.9 C)  98.6 F (37 C)  TempSrc: Oral Oral Oral Oral  SpO2: 97% 97% 100% 99%  Weight:   92 kg (202 lb 12.8 oz)   Height:       Physical Exam General: NAD Heart: RRR Lungs: no rales Abdomen: soft NT  Extremities: no LE edema Dialysis Access: right upper AVF + bruit   Additional Objective Labs: Basic Metabolic Panel:  Recent Labs Lab 09/29/16 0820 09/30/16 0451 10/01/16 0242  NA 136 133* 133*  K 4.3 3.8 3.6  CL 103 97* 97*  CO2 21* 24 24  GLUCOSE 195* 133* 129*  BUN 50* 39* 54*  CREATININE 7.59* 6.39* 7.56*  CALCIUM 9.0 8.2* 8.3*   Liver Function Tests:  Recent Labs Lab 09/29/16 0820  AST 18  ALT 14*  ALKPHOS 72  BILITOT 0.9  PROT 6.8  ALBUMIN 3.0*   No results for input(s): LIPASE, AMYLASE in the last 168 hours. CBC:  Recent Labs Lab 09/29/16 0820 09/29/16 1408 09/30/16 0451 10/01/16 0242  WBC 8.9 8.3 9.3 7.5  NEUTROABS 6.3  --   --   --   HGB 10.2* 11.2* 10.0* 9.9*  HCT 30.8* 33.9* 30.0* 29.8*  MCV 98.4 99.1 97.4 96.4  PLT 304 314 248 246   Blood Culture    Component Value Date/Time   SDES BLOOD LEFT ANTECUBITAL 09/29/2016 0950   SPECREQUEST BOTTLES DRAWN AEROBIC AND ANAEROBIC  5CC 09/29/2016 0950   CULT NO GROWTH 1 DAY 09/29/2016 0950   REPTSTATUS PENDING 09/29/2016 0950    Cardiac Enzymes:  Recent Labs Lab 09/29/16 1408 09/29/16 2241  TROPONINI 0.07* 0.05*   CBG:  Recent Labs Lab 09/30/16 0626 09/30/16 1133 09/30/16 1654 09/30/16 2251 10/01/16 0612  GLUCAP 106* 191* 140* 136* 124*  Medications:  . aspirin  324 mg Oral Once  . aspirin EC   325 mg Oral Daily  . ceFEPime (MAXIPIME) IV  2 g Intravenous Q T,Th,Sa-HD  . cinacalcet  30 mg Oral Q supper  . [START ON 10/02/2016] doxercalciferol  3 mcg Intravenous Q T,Th,Sa-HD  . fluticasone  1 spray Each Nare Daily  . guaiFENesin  600 mg Oral BID  . heparin  5,000 Units Subcutaneous Q8H  . insulin aspart  0-9 Units Subcutaneous TID WC  . lanthanum  1,000 mg Oral TID WC  . losartan  25 mg Oral Daily  . multivitamin  1 tablet Oral QHS  . sodium chloride flush  3 mL Intravenous Q12H  . sodium chloride flush  3 mL Intravenous Q12H  . vancomycin  1,000 mg Intravenous Q T,Th,Sa-HD

## 2016-10-01 NOTE — Progress Notes (Signed)
Progress Note  Patient Name: [name] Date of Encounter: 10/01/2016  Primary Cardiologist: Was Dr. Verl Blalock (now Dr. Oval Linsey)  Subjective   Chest pain only when he coughs.  Still some SOB.    Inpatient Medications    Scheduled Meds: . aspirin  324 mg Oral Once  . aspirin EC  325 mg Oral Daily  . ceFEPime (MAXIPIME) IV  2 g Intravenous Q T,Th,Sa-HD  . cinacalcet  30 mg Oral Q supper  . [START ON 10/02/2016] doxercalciferol  3 mcg Intravenous Q T,Th,Sa-HD  . fluticasone  1 spray Each Nare Daily  . guaiFENesin  600 mg Oral BID  . heparin  5,000 Units Subcutaneous Q8H  . insulin aspart  0-9 Units Subcutaneous TID WC  . lanthanum  1,000 mg Oral TID WC  . losartan  25 mg Oral Daily  . multivitamin  1 tablet Oral QHS  . sodium chloride flush  3 mL Intravenous Q12H  . sodium chloride flush  3 mL Intravenous Q12H  . vancomycin  1,000 mg Intravenous Q T,Th,Sa-HD   Continuous Infusions:  PRN Meds: sodium chloride, acetaminophen, benzonatate, gi cocktail, nitroGLYCERIN, ondansetron (ZOFRAN) IV, sodium chloride flush   Vital Signs    Vitals:   09/30/16 1618 09/30/16 2041 10/01/16 0615 10/01/16 0747  BP: (!) 149/78 (!) 160/87 126/76 (!) 152/81  Pulse: 91 97 91 80  Resp: 20 19 20 18   Temp: 99.1 F (37.3 C) 98.5 F (36.9 C)  98.6 F (37 C)  TempSrc: Oral Oral Oral Oral  SpO2: 97% 97% 100% 99%  Weight:   202 lb 12.8 oz (92 kg)   Height:        Intake/Output Summary (Last 24 hours) at 10/01/16 0939 Last data filed at 10/01/16 0916  Gross per 24 hour  Intake             1660 ml  Output              400 ml  Net             1260 ml   Filed Weights   09/29/16 2116 09/30/16 0527 10/01/16 0615  Weight: 201 lb 1 oz (91.2 kg) 219 lb 9.3 oz (99.6 kg) 202 lb 12.8 oz (92 kg)    Telemetry    NSR with atrial tachycardia - Personally Reviewed  ECG    NA - Personally Reviewed  Physical Exam   GEN: No acute distress.   Neck: No JVD Cardiac: RRR, no murmurs,  rubs, or gallops.  Respiratory: Coarse breath sounds on the right.  GI: Soft, nontender, non-distended  MS: No edema; No deformity. Neuro:  Nonfocal  Psych: Normal  affect   Labs    Chemistry Recent Labs Lab 09/29/16 0820 09/30/16 0451 10/01/16 0242  NA 136 133* 133*  K 4.3 3.8 3.6  CL 103 97* 97*  CO2 21* 24 24  GLUCOSE 195* 133* 129*  BUN 50* 39* 54*  CREATININE 7.59* 6.39* 7.56*  CALCIUM 9.0 8.2* 8.3*  PROT 6.8  --   --   ALBUMIN 3.0*  --   --   AST 18  --   --   ALT 14*  --   --   ALKPHOS 72  --   --   BILITOT 0.9  --   --   GFRNONAA 7* 9* 7*  GFRAA 8* 10* 8*  ANIONGAP 12 12 12      Hematology Recent Labs Lab 09/29/16 1408 09/30/16 0451 10/01/16 [id number]  WBC 8.3 9.3 7.5  RBC 3.42* 3.08* 3.09*  HGB 11.2* 10.0* 9.9*  HCT 33.9* 30.0* 29.8*  MCV 99.1 97.4 96.4  MCH 32.7 32.5 32.0  MCHC 33.0 33.3 33.2  RDW 14.9 15.0 14.8  PLT 314 248 246    Cardiac Enzymes Recent Labs Lab 09/29/16 1408 09/29/16 2241  TROPONINI 0.07* 0.05*    Recent Labs Lab 09/29/16 0833  TROPIPOC 0.05     BNP Recent Labs Lab 09/29/16 0820  BNP 503.1*     DDimer No results for input(s): DDIMER in the last 168 hours.   Radiology    No results found.  Cardiac Studies    Echo 09/30/16:  - Left ventricle: The cavity size was normal. Systolic function was normal. The estimated ejection fraction was 60%. Moderate concentric and severe focal basal septal hypertrophy. Doppler parameters are consistent with abnormal left ventricular relaxation (grade 1 diastolic dysfunction). - Regional wall motion abnormality: Mild hypokinesis of the mid anteroseptal myocardium. - Aortic valve: Mildly to moderately calcified annulus. Trileaflet. - Mitral valve: Mildly thickened leaflets . There was moderate regurgitation. - Left atrium: The atrium was moderately to severely dilated.  Patient Profile     Mr. [name] is a 89M with ESRD on HD, hypertension, chronic  diastolic heart failure here with chest pain and pneumonia.    Assessment & Plan    CHEST PAIN:  Elevated troponin.  No suggestion of acute coronary syndrome. Plan out patient stress testing.   CHRONIC DIASTOLIC HF:    This was felt to be mild/mod when evaluated by Dr. Oval Linsey.  Volume is managed per renal  HTN:    BP is somewhat labile.  Plan per primary team.  No change in therapy suggested at this point.  However, he should have close follow up of this as an out patient with a BP diary.  BP control is key to management of LVH.  Please call with further questions.      Signed, Minus Breeding, MD  10/01/2016, 9:39 AM

## 2016-10-01 NOTE — Progress Notes (Signed)
[facility] PROGRESS NOTE  [name] [id number] DOB: [birth year] DOA: 09/29/2016  PCP: Minerva Ends, MD  Brief History/Interval Summary: 56 year old male with a past medical history of end-stage renal disease on hemodialysis, anemia, hypertension, diabetes, GERD, presented with complaints of chest pain, cough. Chest pain had been present on and off  for about 2 weeks. Patient has an appointment to see a cardiologist on the 31st. However, due to worsening symptoms, he decided to come in to the hospital. Patient was found to have pneumonia. He was hospitalized for further management.  Reason for Visit: Healthcare associated pneumonia. Chest pain  Consultants: Cardiology. Nephrology  Procedures: Hemodialysis  Transthoracic echocardiogram Study Conclusions - Left ventricle: The cavity size was normal. Systolic function was   normal. The estimated ejection fraction was 60%. Moderate   concentric and severe focal basal septal hypertrophy. Doppler   parameters are consistent with abnormal left ventricular   relaxation (grade 1 diastolic dysfunction). - Regional wall motion abnormality: Mild hypokinesis of the mid   anteroseptal myocardium. - Aortic valve: Mildly to moderately calcified annulus. Trileaflet. - Mitral valve: Mildly thickened leaflets . There was moderate   regurgitation. - Left atrium: The atrium was moderately to severely dilated.  Antibiotics: Vancomycin and cefepime to be stopped on 1/29 Levaquin 1/29  Subjective/Interval History: Patient feels well. He is having some social/personal issues with his fiance. Cough is present. He has occasional chest pain but none currently. Overall, he feels better.   ROS: Denies any nausea or vomiting.  Objective:  Vital Signs  Vitals:   09/30/16 1618 09/30/16 2041 10/01/16 0615 10/01/16 0747  BP: (!) 149/78 (!) 160/87 126/76 (!) 152/81  Pulse: 91 97 91 80  Resp: 20 19 20 18   Temp: 99.1 F (37.3  C) 98.5 F (36.9 C)  98.6 F (37 C)  TempSrc: Oral Oral Oral Oral  SpO2: 97% 97% 100% 99%  Weight:   92 kg (202 lb 12.8 oz)   Height:        Intake/Output Summary (Last 24 hours) at 10/01/16 1015 Last data filed at 10/01/16 0916  Gross per 24 hour  Intake             1420 ml  Output              300 ml  Net             1120 ml   Filed Weights   09/29/16 2116 09/30/16 0527 10/01/16 0615  Weight: 91.2 kg (201 lb 1 oz) 99.6 kg (219 lb 9.3 oz) 92 kg (202 lb 12.8 oz)    General appearance: alert, cooperative, appears stated age and no distress Resp: Coarse breath sounds bilaterally. No crackles, wheezing or rhonchi. Improving air entry bilaterally. Cardio: regular rate and rhythm, S1, S2 normal, no murmur, click, rub or gallop GI: soft, non-tender; bowel sounds normal; no masses,  no organomegaly Extremities: extremities normal, atraumatic, no cyanosis or edema Neurologic: Awake and alert. Oriented 3. No focal neurological deficits.  Lab Results:  Data Reviewed: I have personally reviewed following labs and imaging studies  CBC:  Recent Labs Lab 09/29/16 0820 09/29/16 1408 09/30/16 0451 10/01/16 0242  WBC 8.9 8.3 9.3 7.5  NEUTROABS 6.3  --   --   --   HGB 10.2* 11.2* 10.0* 9.9*  HCT 30.8* 33.9* 30.0* 29.8*  MCV 98.4 99.1 97.4 96.4  PLT 304 314 248 0000000    Basic Metabolic Panel:  Recent Labs Lab 09/29/16 0820  09/30/16 0451 10/01/16 0242  NA 136 133* 133*  K 4.3 3.8 3.6  CL 103 97* 97*  CO2 21* 24 24  GLUCOSE 195* 133* 129*  BUN 50* 39* 54*  CREATININE 7.59* 6.39* 7.56*  CALCIUM 9.0 8.2* 8.3*    GFR: Estimated Creatinine Clearance: 12.8 mL/min (by C-G formula based on SCr of 7.56 mg/dL (H)).  Liver Function Tests:  Recent Labs Lab 09/29/16 0820  AST 18  ALT 14*  ALKPHOS 72  BILITOT 0.9  PROT 6.8  ALBUMIN 3.0*    Cardiac Enzymes:  Recent Labs Lab 09/29/16 1408 09/29/16 2241  TROPONINI 0.07* 0.05*   HbA1C:  Recent Labs   09/29/16 1409  HGBA1C 7.1*    CBG:  Recent Labs Lab 09/30/16 0626 09/30/16 1133 09/30/16 1654 09/30/16 2251 10/01/16 0612  GLUCAP 106* 191* 140* 136* 124*    Lipid Profile:  Recent Labs  09/30/16 0451  CHOL 126  HDL 35*  LDLCALC 72  TRIG 95  CHOLHDL 3.6     Recent Results (from the past 240 hour(s))  Blood culture (routine x 2)     Status: None (Preliminary result)   Collection Time: 09/29/16  9:45 AM  Result Value Ref Range Status   Specimen Description BLOOD RIGHT HAND  Final   Special Requests BOTTLES DRAWN AEROBIC AND ANAEROBIC 5CC  Final   Culture NO GROWTH 1 DAY  Final   Report Status PENDING  Incomplete  Blood culture (routine x 2)     Status: None (Preliminary result)   Collection Time: 09/29/16  9:50 AM  Result Value Ref Range Status   Specimen Description BLOOD LEFT ANTECUBITAL  Final   Special Requests BOTTLES DRAWN AEROBIC AND ANAEROBIC  5CC  Final   Culture NO GROWTH 1 DAY  Final   Report Status PENDING  Incomplete      Radiology Studies: No results found.   Medications:  Scheduled: . aspirin  324 mg Oral Once  . aspirin EC  325 mg Oral Daily  . ceFEPime (MAXIPIME) IV  2 g Intravenous Q T,Th,Sa-HD  . cinacalcet  30 mg Oral Q supper  . [START ON 10/02/2016] doxercalciferol  3 mcg Intravenous Q T,Th,Sa-HD  . fluticasone  1 spray Each Nare Daily  . guaiFENesin  600 mg Oral BID  . heparin  5,000 Units Subcutaneous Q8H  . insulin aspart  0-9 Units Subcutaneous TID WC  . lanthanum  1,000 mg Oral TID WC  . losartan  25 mg Oral Daily  . multivitamin  1 tablet Oral QHS  . sodium chloride flush  3 mL Intravenous Q12H  . sodium chloride flush  3 mL Intravenous Q12H  . vancomycin  1,000 mg Intravenous Q T,Th,Sa-HD   Continuous:  [id number] chloride, acetaminophen, benzonatate, gi cocktail, nitroGLYCERIN, ondansetron (ZOFRAN) IV, sodium chloride flush  Assessment/Plan:  Principal Problem:   HCAP (healthcare-associated pneumonia) Active  Problems:   Anemia of chronic renal failure, stage 4 (severe) (HCC)   Type 2 diabetes, controlled, with renal manifestation (HCC)   End stage renal disease on dialysis (HCC)   Chest pain   Elevated troponin   LVH (left ventricular hypertrophy)    Healthcare associated pneumonia  Patient placed on vancomycin and cefepime. Cultures are negative so far. Patient has dramatically improved. Influenza PCR negative. Change to Levaquin. HIV nonreactive. Strep pneumo urinary antigen negative.  Chest pain Etiology for his chest pain is not entirely clear. He did mention that his chest pain occurred when he was walking to  his work. He mentioned pressure-like sensation. However, he also had cough. According to the admitting physician's note, there was some pleuritic component and musculoskeletal component to this pain. Apparently this has been ongoing for about 2 weeks on and off. He is supposed to see cardiologist on the 31st. He does have a slight bump in troponin, which could be due to renal failure, but it is difficult to be certain. EKG with some T wave changes. Echocardiogram as above. There is some wall motion abnormalities. Patient was seen by cardiology. They feel that his echocardiogram changes are mainly due to LVH. They feel his chest pain could be due to pneumonia. He does not need any further workup at this time. LDL 72. Since there is a pleuritic component to his chest pain proceed with VQ scan.  End-stage renal disease on hemodialysis on Tuesday, Thursday, Saturday Seen by nephrology. Dialyzed yesterday. Stable. Patient to undergo a vascular surgery procedure to his AV fistula, once cleared by cardiology.  Diabetes mellitus type 2 CBG is a reasonably well-controlled. Patient has not been any glucose lowering agents at home. His HbA1c is 7.1. He will need to discuss this further with his outpatient providers. Will not initiate any agents at this time. Diet control.  Essential  hypertension. Continue home medications. Needs good blood pressure control due to presence of LVH. Blood pressure does drop with dialysis, so we need to be cautious.  Normocytic anemia. Most likely due to chronic kidney disease. Monitor hemoglobin. No evidence for overt bleeding.  DVT Prophylaxis: Subcutaneous heparin    Code Status: Full code  Family Communication: Discussed with the patient  Disposition Plan: Plan to change to oral antibiotics today. VQ scan today. Social worker consult due to his home situation. Mobilize. Anticipate discharge tomorrow.    LOS: 2 days   [place] [facility] Pager [phone] 10/01/2016, 10:15 AM  If 7PM-7AM, please contact night-coverage at [email or web address], password [facility]

## 2016-10-01 NOTE — Progress Notes (Signed)
Received call from radiology with a verbal report that on patients CT scan there appeared to be an atypical infection, could not rule out TB.  Sent text page to night coverage for [facility] regarding this.  Did discuss this with patient and explained to him the need for him to be placed on airborne precautions and that he would have to be moved to a negative pressure room.  Printed handouts on TB and airborne precautions and gave to patient.  Stressed to patient that RN is not saying he has tuberculosis, just that further workup needs to be done.  Patient expressed understanding of this.    Patient has been stable during 7 a to 7 p shift, no fever.  Only real complaint is cough that he continues to receive Tessalon Perles and Mucinex for.  Continues to be concerned about home situation when he is discharged from the hospital.  This RN spoke personally with social work about consult that was placed this weekend.

## 2016-10-01 NOTE — Progress Notes (Signed)
Pharmacy Antibiotic Note  [name] is a 56 y.o. male admitted on 09/29/2016 with pneumonia.  Pharmacy has been consulted for Levaquin dosing. Today is day #3 antibiotics, previously on vancomycin and cefepime. ESRD on HD TTS. BCx are ngtd, afebrile, WBC normal.  Plan: - Levaquin 750 mg PO tonight then 500 mg PO q48h - Recommend 7 days total of antibiotics - Pharmacy signing off  Height: 5\' 11"  (180.3 cm) Weight: 202 lb 12.8 oz (92 kg) (Scale B) IBW/kg (Calculated) : 75.3  Temp (24hrs), Avg:98.7 F (37.1 C), Min:98.5 F (36.9 C), Max:99.1 F (37.3 C)   Recent Labs Lab 09/29/16 0820 09/29/16 0836 09/29/16 1408 09/30/16 0451 10/01/16 0242  WBC 8.9  --  8.3 9.3 7.5  CREATININE 7.59*  --   --  6.39* 7.56*  LATICACIDVEN  --  2.03*  --   --   --     Estimated Creatinine Clearance: 12.8 mL/min (by C-G formula based on SCr of 7.56 mg/dL (H)).    No Known Allergies   Thank you for allowing pharmacy to be a part of this patient's care.  Renold Genta, PharmD, BCPS Clinical Pharmacist Phone for today - [place] - [phone] 10/01/2016 2:03 PM

## 2016-10-01 NOTE — Progress Notes (Signed)
Patient's vital signs stable overnight. Patient reports chest pain from coughing has improved. No complaints of pain. Patient in bed resting.

## 2016-10-02 ENCOUNTER — Inpatient Hospital Stay (HOSPITAL_COMMUNITY): Payer: Medicare Other

## 2016-10-02 ENCOUNTER — Encounter (HOSPITAL_COMMUNITY): Payer: Self-pay | Admitting: Nephrology

## 2016-10-02 DIAGNOSIS — F1910 Other psychoactive substance abuse, uncomplicated: Secondary | ICD-10-CM

## 2016-10-02 DIAGNOSIS — Z8249 Family history of ischemic heart disease and other diseases of the circulatory system: Secondary | ICD-10-CM

## 2016-10-02 DIAGNOSIS — R609 Edema, unspecified: Secondary | ICD-10-CM

## 2016-10-02 DIAGNOSIS — R7612 Nonspecific reaction to cell mediated immunity measurement of gamma interferon antigen response without active tuberculosis: Secondary | ICD-10-CM

## 2016-10-02 DIAGNOSIS — R0781 Pleurodynia: Secondary | ICD-10-CM

## 2016-10-02 DIAGNOSIS — J181 Lobar pneumonia, unspecified organism: Secondary | ICD-10-CM

## 2016-10-02 LAB — RENAL FUNCTION PANEL
Albumin: 2.9 g/dL — ABNORMAL LOW (ref 3.5–5.0)
Anion gap: 14 (ref 5–15)
BUN: 68 mg/dL — ABNORMAL HIGH (ref 6–20)
CO2: 21 mmol/L — ABNORMAL LOW (ref 22–32)
Calcium: 8.1 mg/dL — ABNORMAL LOW (ref 8.9–10.3)
Chloride: 99 mmol/L — ABNORMAL LOW (ref 101–111)
Creatinine, Ser: 8.2 mg/dL — ABNORMAL HIGH (ref 0.61–1.24)
GFR calc Af Amer: 8 mL/min — ABNORMAL LOW (ref >60)
GFR calc non Af Amer: 7 mL/min — ABNORMAL LOW (ref >60)
Glucose, Bld: 151 mg/dL — ABNORMAL HIGH (ref 65–99)
Phosphorus: 4.7 mg/dL — ABNORMAL HIGH (ref 2.5–4.6)
Potassium: 3.9 mmol/L (ref 3.5–5.1)
Sodium: 134 mmol/L — ABNORMAL LOW (ref 135–145)

## 2016-10-02 LAB — CBC
HCT: 29.5 % — ABNORMAL LOW (ref 39.0–52.0)
Hemoglobin: 10 g/dL — ABNORMAL LOW (ref 13.0–17.0)
MCH: 32.5 pg (ref 26.0–34.0)
MCHC: 33.9 g/dL (ref 30.0–36.0)
MCV: 95.8 fL (ref 78.0–100.0)
Platelets: 279 10*3/uL (ref 150–400)
RBC: 3.08 MIL/uL — ABNORMAL LOW (ref 4.22–5.81)
RDW: 14.8 % (ref 11.5–15.5)
WBC: 6.6 10*3/uL (ref 4.0–10.5)

## 2016-10-02 LAB — GLUCOSE, CAPILLARY
GLUCOSE-CAPILLARY: 167 mg/dL — AB (ref 65–99)
GLUCOSE-CAPILLARY: 133 mg/dL — AB (ref 65–99)

## 2016-10-02 MED ORDER — LIDOCAINE HCL (PF) 1 % IJ SOLN: 5.0000 mL | INTRAMUSCULAR | Status: DC | PRN

## 2016-10-02 MED ORDER — CEFIXIME 400 MG PO CAPS
400.0000 mg | ORAL_CAPSULE | Freq: Every day | ORAL | Status: DC
  Administered 2016-10-03: 400 mg via ORAL
  Filled 2016-10-02: qty 1

## 2016-10-02 MED ORDER — DOXERCALCIFEROL 4 MCG/2ML IV SOLN
INTRAVENOUS | Status: AC
  Administered 2016-10-02: 3 ug via INTRAVENOUS
  Filled 2016-10-02: qty 2

## 2016-10-02 MED ORDER — BENZONATATE 100 MG PO CAPS: 100.0000 mg | ORAL_CAPSULE | Freq: Three times a day (TID) | ORAL | 0 refills | Status: DC | PRN

## 2016-10-02 MED ORDER — HEPARIN SODIUM (PORCINE) 1000 UNIT/ML DIALYSIS
3000.0000 [IU] | INTRAMUSCULAR | Status: DC | PRN
  Filled 2016-10-02: qty 3

## 2016-10-02 MED ORDER — RENA-VITE PO TABS: 1.0000 | ORAL_TABLET | Freq: Every day | ORAL | 0 refills | Status: DC

## 2016-10-02 MED ORDER — SODIUM CHLORIDE 0.9 % IV SOLN: 100.0000 mL | INTRAVENOUS | Status: DC | PRN

## 2016-10-02 MED ORDER — LIDOCAINE-PRILOCAINE 2.5-2.5 % EX CREA
1.0000 "application " | TOPICAL_CREAM | CUTANEOUS | Status: DC | PRN
  Filled 2016-10-02: qty 5

## 2016-10-02 MED ORDER — HEPARIN SODIUM (PORCINE) 1000 UNIT/ML DIALYSIS
1000.0000 [IU] | INTRAMUSCULAR | Status: DC | PRN
  Filled 2016-10-02: qty 1

## 2016-10-02 MED ORDER — ALTEPLASE 2 MG IJ SOLR: 2.0000 mg | Freq: Once | INTRAMUSCULAR | Status: DC | PRN

## 2016-10-02 MED ORDER — PENTAFLUOROPROP-TETRAFLUOROETH EX AERO: 1.0000 "application " | INHALATION_SPRAY | CUTANEOUS | Status: DC | PRN

## 2016-10-02 MED ORDER — CEFIXIME 400 MG PO CAPS: 400.0000 mg | ORAL_CAPSULE | Freq: Every day | ORAL | 0 refills | Status: AC

## 2016-10-02 NOTE — Progress Notes (Signed)
[facility] PROGRESS NOTE  [name] [id number] DOB: 1961/07/24 DOA: 09/29/2016  PCP: Minerva Ends, MD  Brief History/Interval Summary: 56 year old male with a past medical history of end-stage renal disease on hemodialysis, anemia, hypertension, diabetes, GERD, presented with complaints of chest pain, cough. Chest pain had been present on and off  for about 2 weeks. Patient has an appointment to see a cardiologist on the 31st. However, due to worsening symptoms, he decided to come in to the hospital. Patient was found to have pneumonia. He was hospitalized for further management.  Reason for Visit: Healthcare associated pneumonia. Chest pain  Consultants: Cardiology. Nephrology  Procedures: Hemodialysis  Transthoracic echocardiogram Study Conclusions - Left ventricle: The cavity size was normal. Systolic function was   normal. The estimated ejection fraction was 60%. Moderate   concentric and severe focal basal septal hypertrophy. Doppler   parameters are consistent with abnormal left ventricular   relaxation (grade 1 diastolic dysfunction). - Regional wall motion abnormality: Mild hypokinesis of the mid   anteroseptal myocardium. - Aortic valve: Mildly to moderately calcified annulus. Trileaflet. - Mitral valve: Mildly thickened leaflets . There was moderate   regurgitation. - Left atrium: The atrium was moderately to severely dilated.  Lower extremity Dopplers negative for DVT  Antibiotics: Vancomycin and cefepime to be stopped on 1/29 Levaquin 1/29-1/30 Cefixime 1/30  Subjective/Interval History: Patient continues to have a cough with yellowish expectoration. Overall feels better. Occasional chest pain.   ROS: Denies any nausea or vomiting.  Objective:  Vital Signs  Vitals:   10/01/16 0615 10/01/16 0747 10/01/16 2159 10/02/16 0528  BP: 126/76 (!) 152/81 (!) 178/90 (!) 158/83  Pulse: 91 80 87 88  Resp: 20 18 18 18   Temp:  98.6 F  (37 C) 97.7 F (36.5 C) 98.4 F (36.9 C)  TempSrc: Oral Oral Oral Oral  SpO2: 100% 99% 98% 96%  Weight: 92 kg (202 lb 12.8 oz)   91.6 kg (201 lb 14.4 oz)  Height:        Intake/Output Summary (Last 24 hours) at 10/02/16 0742 Last data filed at 10/01/16 2200  Gross per 24 hour  Intake              486 ml  Output                0 ml  Net              486 ml   Filed Weights   09/30/16 0527 10/01/16 0615 10/02/16 0528  Weight: 99.6 kg (219 lb 9.3 oz) 92 kg (202 lb 12.8 oz) 91.6 kg (201 lb 14.4 oz)    General appearance: alert, cooperative, appears stated age and no distress Resp: Coarse breath sounds bilaterally. No crackles, wheezing or rhonchi. Improving air entry bilaterally. Cardio: regular rate and rhythm, S1, S2 normal, no murmur, click, rub or gallop GI: soft, non-tender; bowel sounds normal; no masses,  no organomegaly Extremities: extremities normal, atraumatic, no cyanosis or edema Neurologic: Awake and alert. Oriented 3. No focal neurological deficits.  Lab Results:  Data Reviewed: I have personally reviewed following labs and imaging studies  CBC:  Recent Labs Lab 09/29/16 0820 09/29/16 1408 09/30/16 0451 10/01/16 0242  WBC 8.9 8.3 9.3 7.5  NEUTROABS 6.3  --   --   --   HGB 10.2* 11.2* 10.0* 9.9*  HCT 30.8* 33.9* 30.0* 29.8*  MCV 98.4 99.1 97.4 96.4  PLT 304 314 248 0000000    Basic Metabolic Panel:  Recent Labs Lab 09/29/16 0820 09/30/16 0451 10/01/16 0242  NA 136 133* 133*  K 4.3 3.8 3.6  CL 103 97* 97*  CO2 21* 24 24  GLUCOSE 195* 133* 129*  BUN 50* 39* 54*  CREATININE 7.59* 6.39* 7.56*  CALCIUM 9.0 8.2* 8.3*    GFR: Estimated Creatinine Clearance: 12.8 mL/min (by C-G formula based on SCr of 7.56 mg/dL (H)).  Liver Function Tests:  Recent Labs Lab 09/29/16 0820  AST 18  ALT 14*  ALKPHOS 72  BILITOT 0.9  PROT 6.8  ALBUMIN 3.0*    Cardiac Enzymes:  Recent Labs Lab 09/29/16 1408 09/29/16 2241  TROPONINI 0.07* 0.05*    HbA1C:  Recent Labs  09/29/16 1409  HGBA1C 7.1*    CBG:  Recent Labs Lab 09/30/16 2251 10/01/16 0612 10/01/16 1129 10/01/16 1731 10/01/16 2150  GLUCAP 136* 124* 182* 182* 113*    Lipid Profile:  Recent Labs  09/30/16 0451  CHOL 126  HDL 35*  LDLCALC 72  TRIG 95  CHOLHDL 3.6     Recent Results (from the past 240 hour(s))  Blood culture (routine x 2)     Status: None (Preliminary result)   Collection Time: 09/29/16  9:45 AM  Result Value Ref Range Status   Specimen Description BLOOD RIGHT HAND  Final   Special Requests BOTTLES DRAWN AEROBIC AND ANAEROBIC 5CC  Final   Culture NO GROWTH 2 DAYS  Final   Report Status PENDING  Incomplete  Blood culture (routine x 2)     Status: None (Preliminary result)   Collection Time: 09/29/16  9:50 AM  Result Value Ref Range Status   Specimen Description BLOOD LEFT ANTECUBITAL  Final   Special Requests BOTTLES DRAWN AEROBIC AND ANAEROBIC  5CC  Final   Culture NO GROWTH 2 DAYS  Final   Report Status PENDING  Incomplete      Radiology Studies: Dg Chest 2 View  Result Date: 10/01/2016 CLINICAL DATA:  Recent pneumonia, cough, left chest pain, diabetes, on dialysis EXAM: CHEST  2 VIEW COMPARISON:  09/29/2016 FINDINGS: Mild cardiomegaly with vascular congestion. Improving right perihilar patchy bronchovascular airspace process compatible with resolving pneumonia. Monitor leads overlie the chest. No current edema, effusion, or pneumothorax. Trachea is midline. IMPRESSION: Cardiomegaly with vascular congestion Improving right upper lung perihilar airspace process/ pneumonia. Electronically Signed   By: Jerilynn Mages.  Shick M.D.   On: 10/01/2016 16:18   Ct Angio Chest Pe W Or Wo Contrast  Result Date: 10/01/2016 CLINICAL DATA:  56 year old diabetic hypertensive male male on dialysis with persistent cough. Chest pain. Subsequent encounter. EXAM: CT ANGIOGRAPHY CHEST WITH CONTRAST TECHNIQUE: Multidetector CT imaging of the chest was performed  using the standard protocol during bolus administration of intravenous contrast. Multiplanar CT image reconstructions and MIPs were obtained to evaluate the vascular anatomy. CONTRAST:  80 cc Isovue 370. COMPARISON:  10/01/2016 chest x-ray. FINDINGS: Cardiovascular:  No pulmonary embolus or aortic dissection. Heart size top-normal.  Coronary artery calcifications. Mediastinum/Nodes: Right hilar adenopathy. Top-normal size subcarinal lymph nodes. Lungs/Pleura: Prominent nodular consolidation right upper lobe. Smaller region of nodular consolidation posteromedial left lower lobe. Upper Abdomen: No worrisome abnormality. Musculoskeletal: No osseous destructive lesion. Mild degenerative changes thoracic spine. Review of the MIP images confirms the above findings. IMPRESSION: No pulmonary embolus or aortic dissection. Prominent nodular consolidation right upper lobe. Smaller region of nodular consolidation posteromedial left lower lobe.Right hilar adenopathy. Findings suggestive of infectious process possibly atypical infection such as fungal infection or tuberculosis. Coronary artery calcifications. These  results will be called to the ordering clinician or representative by the Radiologist Assistant, and communication documented in the PACS or zVision Dashboard. Electronically Signed   By: Genia Del M.D.   On: 10/01/2016 18:55   Nm Pulmonary Perf And Vent  Result Date: 10/01/2016 CLINICAL DATA:  Chest pain. EXAM: NUCLEAR MEDICINE VENTILATION - PERFUSION LUNG SCAN TECHNIQUE: Ventilation images were obtained in multiple projections using inhaled aerosol Tc-64m DTPA. Perfusion images were obtained in multiple projections after intravenous injection of Tc-68m MAA. RADIOPHARMACEUTICALS:  27.9 mCi Technetium-110m DTPA aerosol inhalation and 4.3 mCi Technetium-84m MAA IV COMPARISON:  Chest x-ray 09/29/2016 FINDINGS: Matched ventilation-perfusion defects are noted on the right. These are in the region of previously  identified infiltrates. This scan is indeterminate for pulmonary embolus . IMPRESSION: Matched ventilation-perfusion defects on the right in the region of previously identified infiltrates. Indeterminate scan for pulmonary embolus. Electronically Signed   By: [name]  Register   On: 10/01/2016 16:22     Medications:  Scheduled: . aspirin EC  325 mg Oral Daily  . cinacalcet  30 mg Oral Q supper  . doxercalciferol  3 mcg Intravenous Q T,Th,Sa-HD  . fluticasone  1 spray Each Nare Daily  . guaiFENesin  600 mg Oral BID  . heparin  5,000 Units Subcutaneous Q8H  . insulin aspart  0-9 Units Subcutaneous TID WC  . lanthanum  1,000 mg Oral TID WC  . [START ON 10/03/2016] levofloxacin  500 mg Oral Q48H  . losartan  25 mg Oral Daily  . multivitamin  1 tablet Oral QHS  . sodium chloride flush  3 mL Intravenous Q12H  . sodium chloride flush  3 mL Intravenous Q12H   Continuous:  [id number] chloride, acetaminophen, benzonatate, gi cocktail, nitroGLYCERIN, ondansetron (ZOFRAN) IV, sodium chloride flush, technetium TC 33M diethylenetriame-pentaacetic acid  Assessment/Plan:  Principal Problem:   HCAP (healthcare-associated pneumonia) Active Problems:   Anemia of chronic renal failure, stage 4 (severe) (HCC)   Type 2 diabetes, controlled, with renal manifestation (HCC)   End stage renal disease on dialysis (HCC)   Chest pain   Elevated troponin   LVH (left ventricular hypertrophy)    Healthcare associated pneumonia  Patient placed on vancomycin and cefepime. Cultures are negative so far. Patient has improved. Influenza PCR negative. Changed to Levaquin. HIV nonreactive. Strep pneumo urinary antigen negative. CT scan was done yesterday to rule out pulmonary embolism. No PE was noted. However CT scan suggested nodular consolidation in the right lung concerning for atypical infection. CT report raised concern for tuberculosis. ID was consulted. Patient does not have any risk factors for tuberculosis.  He was taken off of precautions. He will be given 2 more days of cefixime.  Chest pain Etiology for his chest pain is not entirely clear. He did mention that his chest pain occurred when he was walking to his work. He mentioned pressure-like sensation. However, he also had cough. According to the admitting physician's note, there was some pleuritic component and musculoskeletal component to this pain. Apparently this has been ongoing for about 2 weeks on and off. He is supposed to see cardiologist on the 31st. He does have a slight bump in troponin, which could be due to renal failure, but it is difficult to be certain. EKG with some T wave changes. Echocardiogram as above. There is some wall motion abnormalities. Patient was seen by cardiology. They feel that his echocardiogram changes are mainly due to LVH. They feel his chest pain could be due to pneumonia.  He does not need any further cardiac workup at this time. LDL 72. Since there was a pleuritic component to his chest pain . A VQ scan was initially done, which was indeterminate. This was followed by CT angiogram which was negative for PE. No DVT noted in lower extremity Doppler study.  End-stage renal disease on hemodialysis on Tuesday, Thursday, Saturday Seen by nephrology. Patient to undergo a vascular surgery procedure to his AV fistula some point in the near future. Patient dialyzed during this hospital stay. Will be dialyzed today.  Diabetes mellitus type 2 CBG is a reasonably well-controlled. Patient has not been any glucose lowering agents at home. His HbA1c is 7.1. He will need to discuss this further with his outpatient providers. Will not initiate any agents at this time. Diet control.  Essential hypertension. Continue home medications. Needs good blood pressure control due to presence of LVH. Blood pressure does drop with dialysis, so we need to be cautious.  Normocytic anemia. Most likely due to chronic kidney disease. No evidence  for overt bleeding.  DVT Prophylaxis: Subcutaneous heparin    Code Status: Full code  Family Communication: Discussed with the patient  Disposition Plan: The main thing remaining now is to address his living situation. Social worker to work with him on this. Once this has been sorted out, he could be discharged.    LOS: 3 days   [place] [facility] Pager [phone] 10/02/2016, 7:42 AM  If 7PM-7AM, please contact night-coverage at [email or web address], password [facility]

## 2016-10-02 NOTE — Progress Notes (Signed)
*  PRELIMINARY RESULTS* Vascular Ultrasound Lower extremity venous duplex has been completed.  Preliminary findings: No evidence of DVT or baker's cyst.    Landry Mellow, RDMS, RVT  10/02/2016, 1:59 PM

## 2016-10-02 NOTE — Progress Notes (Signed)
Received report from day shift nurse. Nurse received call from radiology with a verbal report that on patients CT scan there appeared to be an atypical infection, could not rule out TB. Nurse reported to this nurse she paged Provider to verify if patient needed to be on airborne precautions. No response received. Night shift nurse paged provider as well regarding whether patient needs to be on airborne precautions. No response to page. Nurse consulted Charge RN on shift. Charge RN decided it will be proactive to move patient to negative pressure room pending response from provider.Patient transferred to new room.Air borne precautions initiated.  Patient doing well currently in bed resting. Will continue to monitor.

## 2016-10-02 NOTE — Progress Notes (Signed)
Dialysis treatment completed.  2350 mL ultrafiltrated and net fluid removal 1850 mL.    Patient status unchanged. Lung sounds clear to ausculation in all fields. Generalized edema. Cardiac: NSR.  Disconnected lines and removed needles.  Pressure held for 10 minutes and band aid/gauze dressing applied.  Report given to bedside RN, Danae Chen.

## 2016-10-02 NOTE — Progress Notes (Addendum)
Patient arrived to unit per bed.  Reviewed treatment plan and this RN agrees.  Report received from bedside RN, Janett Billow.  Consent verified.  Patient A & O X 4. Lung sounds clear to ausculation in all fields. No edema. Cardiac: NSR.  Prepped RUAVG with alcohol and cannulated with two 15 gauge needles.  Pulsation of blood noted.  Flushed access well with saline per protocol.  Connected and secured lines and initiated tx at 1524.  UF goal of 2600 mL and net fluid removal of 2100 mL.  Will continue to monitor.   Orthostatic BP obtained prior to treatment.   Laying down - 169/89  Sitting - 155/84  Standing - 176/88

## 2016-10-02 NOTE — Progress Notes (Addendum)
[facility] Progress Note    Assessment/ Plan:   Dialysis Orders: TTS [place] 4 hr EDW 90 2K 2.5 Ca AVF heparin 6000 with 2000 mid tmt venofer 50 per week Mircera 50 q 4 weeks last 1/16 hectorol 2 right upper AVF Recent labs:hgb 11 stable 41% sat ferritin 1214 iPTH 717 corr Ca 9.8 P 5.9 Access issues:evaluated by Dr. Bridgett Larsson 1/19 - has stenotic prox ceph vein - needs turndown procedure - but needs cardiology eval first due to angina like sx  Assessment/Plan: 1. HCAP with chest pain- vanc and cefepime per primary, culturespending/work up per primary; does not look acutely ill; [facility] wants cardiology eval before surgery  Had appt 1/31 - may be done while in hosp per primary note yesterday 2. ESRD- TTS - next HD Tuesday K 3.6 - start on 3 K bath tomorrow with HD - waiting for clearance prior to swinging the arch down to the axillary vein (vascular access for hd).  Seen on dialysis at 330pm 3k bath qb/qd 400/800 UF 2.6L 162/90  Rt BCF EDW 90kg JWL   3. Hypertension/volume- BP controlled with volume removal and low dose losartan- net UF 4 L Sat - post st 91.2- BP somewhat variable - get orthostatics pre and post HD- goal for EDW 4. Anemia- hgb 9.9 down some from outpt labs - follow - stop Fe for now due to ^ ferritin and adequate tsat - not due for ESA for 2 weeks - follow trending - if hgb drops further can redose sooner 5. Metabolic bone disease- Continue hectorol -- actually need to ^ hectorol to 3 and change bath to 2.25 at discharge; continue sensipar and fosrenol 6. Nutrition- renal diet/vits                     7. Significant psycho social stressors outpt dialysis SW is aware          8.    Pleuritic CP - suspicious CXR being r/o for TB.  Subjective:   CP mostly with cough. Trying to work out personal situation with probably d/c to [facility] for shelter - has stayed there in the past and also [facility] for 19 months. Financial issues and no  transportation as well. Pleuritic CP when he's not coughing as well.   Objective:   BP (!) 158/83 (BP Location: Left Arm)   Pulse 88   Temp 98.4 F (36.9 C) (Oral)   Resp 18   Ht 5\' 11"  (1.803 m)   Wt 91.6 kg (201 lb 14.4 oz)   SpO2 96%   BMI 28.16 kg/m   Intake/Output Summary (Last 24 hours) at 10/02/16 0849 Last data filed at 10/01/16 2200  Gross per 24 hour  Intake              486 ml  Output                0 ml  Net              486 ml   Weight change: -0.408 kg (-14.4 oz)  Physical Exam: General: NAD Heart: RRR Lungs: no rales Abdomen: soft NT  Extremities: no LE edema Dialysis Access: right upper BCF + bruit, pulsatile  Imaging: Dg Chest 2 View  Result Date: 10/01/2016 CLINICAL DATA:  Recent pneumonia, cough, left chest pain, diabetes, on dialysis EXAM: CHEST  2 VIEW COMPARISON:  09/29/2016 FINDINGS: Mild cardiomegaly with vascular congestion. Improving right perihilar patchy bronchovascular airspace process compatible  with resolving pneumonia. Monitor leads overlie the chest. No current edema, effusion, or pneumothorax. Trachea is midline. IMPRESSION: Cardiomegaly with vascular congestion Improving right upper lung perihilar airspace process/ pneumonia. Electronically Signed   By: Jerilynn Mages.  Shick M.D.   On: 10/01/2016 16:18   Ct Angio Chest Pe W Or Wo Contrast  Result Date: 10/01/2016 CLINICAL DATA:  56 year old diabetic hypertensive male male on dialysis with persistent cough. Chest pain. Subsequent encounter. EXAM: CT ANGIOGRAPHY CHEST WITH CONTRAST TECHNIQUE: Multidetector CT imaging of the chest was performed using the standard protocol during bolus administration of intravenous contrast. Multiplanar CT image reconstructions and MIPs were obtained to evaluate the vascular anatomy. CONTRAST:  80 cc Isovue 370. COMPARISON:  10/01/2016 chest x-ray. FINDINGS: Cardiovascular:  No pulmonary embolus or aortic dissection. Heart size top-normal.  Coronary artery calcifications.  Mediastinum/Nodes: Right hilar adenopathy. Top-normal size subcarinal lymph nodes. Lungs/Pleura: Prominent nodular consolidation right upper lobe. Smaller region of nodular consolidation posteromedial left lower lobe. Upper Abdomen: No worrisome abnormality. Musculoskeletal: No osseous destructive lesion. Mild degenerative changes thoracic spine. Review of the MIP images confirms the above findings. IMPRESSION: No pulmonary embolus or aortic dissection. Prominent nodular consolidation right upper lobe. Smaller region of nodular consolidation posteromedial left lower lobe.Right hilar adenopathy. Findings suggestive of infectious process possibly atypical infection such as fungal infection or tuberculosis. Coronary artery calcifications. These results will be called to the ordering clinician or representative by the Radiologist Assistant, and communication documented in the PACS or zVision Dashboard. Electronically Signed   By: Genia Del M.D.   On: 10/01/2016 18:55   Nm Pulmonary Perf And Vent  Result Date: 10/01/2016 CLINICAL DATA:  Chest pain. EXAM: NUCLEAR MEDICINE VENTILATION - PERFUSION LUNG SCAN TECHNIQUE: Ventilation images were obtained in multiple projections using inhaled aerosol Tc-102m DTPA. Perfusion images were obtained in multiple projections after intravenous injection of Tc-66m MAA. RADIOPHARMACEUTICALS:  27.9 mCi Technetium-28m DTPA aerosol inhalation and 4.3 mCi Technetium-33m MAA IV COMPARISON:  Chest x-ray 09/29/2016 FINDINGS: Matched ventilation-perfusion defects are noted on the right. These are in the region of previously identified infiltrates. This scan is indeterminate for pulmonary embolus . IMPRESSION: Matched ventilation-perfusion defects on the right in the region of previously identified infiltrates. Indeterminate scan for pulmonary embolus. Electronically Signed   By: [name]  Register   On: 10/01/2016 16:22    Labs: BMET  Recent Labs Lab 09/29/16 0820 09/30/16 0451  10/01/16 0242  NA 136 133* 133*  K 4.3 3.8 3.6  CL 103 97* 97*  CO2 21* 24 24  GLUCOSE 195* 133* 129*  BUN 50* 39* 54*  CREATININE 7.59* 6.39* 7.56*  CALCIUM 9.0 8.2* 8.3*   CBC  Recent Labs Lab 09/29/16 0820 09/29/16 1408 09/30/16 0451 10/01/16 0242  WBC 8.9 8.3 9.3 7.5  NEUTROABS 6.3  --   --   --   HGB 10.2* 11.2* 10.0* 9.9*  HCT 30.8* 33.9* 30.0* 29.8*  MCV 98.4 99.1 97.4 96.4  PLT 304 314 248 246    Medications:    . aspirin EC  325 mg Oral Daily  . cinacalcet  30 mg Oral Q supper  . doxercalciferol  3 mcg Intravenous Q T,Th,Sa-HD  . fluticasone  1 spray Each Nare Daily  . guaiFENesin  600 mg Oral BID  . heparin  5,000 Units Subcutaneous Q8H  . insulin aspart  0-9 Units Subcutaneous TID WC  . lanthanum  1,000 mg Oral TID WC  . [START ON 10/03/2016] levofloxacin  500 mg Oral Q48H  . losartan  25 mg Oral Daily  . multivitamin  1 tablet Oral QHS  . sodium chloride flush  3 mL Intravenous Q12H  . sodium chloride flush  3 mL Intravenous Q12H      Otelia Santee, MD 10/02/2016, 8:49 AM

## 2016-10-02 NOTE — Discharge Instructions (Signed)
Pneumonia, Adult Introduction Pneumonia is an infection of the lungs. One type of pneumonia can happen while a person is in a hospital. A different type can happen when a person is not in a hospital (community-acquired pneumonia). It is easy for this kind to spread from person to person. It can spread to you if you breathe near an infected person who coughs or sneezes. Some symptoms include:  A dry cough.  A wet (productive) cough.  Fever.  Sweating.  Chest pain. Follow these instructions at home:  Take over-the-counter and prescription medicines only as told by your doctor.  Only take cough medicine if you are losing sleep.  If you were prescribed an antibiotic medicine, take it as told by your doctor. Do not stop taking the antibiotic even if you start to feel better.  Sleep with your head and neck raised (elevated). You can do this by putting a few pillows under your head, or you can sleep in a recliner.  Do not use tobacco products. These include cigarettes, chewing tobacco, and e-cigarettes. If you need help quitting, ask your doctor.  Drink enough water to keep your pee (urine) clear or pale yellow. A shot (vaccine) can help prevent pneumonia. Shots are often suggested for:  People older than 56 years of age.  People older than 56 years of age:  Who are having cancer treatment.  Who have long-term (chronic) lung disease.  Who have problems with their body's defense system (immune system). You may also prevent pneumonia if you take these actions:  Get the flu (influenza) shot every year.  Go to the dentist as often as told.  Wash your hands often. If soap and water are not available, use hand sanitizer. Contact a doctor if:  You have a fever.  You lose sleep because your cough medicine does not help. Get help right away if:  You are short of breath and it gets worse.  You have more chest pain.  Your sickness gets worse. This is very serious if:  You are  an older adult.  Your body's defense system is weak.  You cough up blood. This information is not intended to replace advice given to you by your health care provider. Make sure you discuss any questions you have with your health care provider. Document Released: 02/06/2008 Document Revised: 01/26/2016 Document Reviewed: 12/15/2014  2017 [employer]

## 2016-10-02 NOTE — Discharge Summary (Signed)
[facility]  Physician Discharge Summary   Patient ID: [name] MRN: [id number] DOB/AGE: 1961-01-31 56 y.o.  Admit date: 09/29/2016 Discharge date: 10/02/2016  PCP: Minerva Ends, MD  DISCHARGE DIAGNOSES:  Principal Problem:   HCAP (healthcare-associated pneumonia) Active Problems:   Anemia of chronic renal failure, stage 4 (severe) (HCC)   Type 2 diabetes, controlled, with renal manifestation (HCC)   End stage renal disease on dialysis (HCC)   Chest pain   Elevated troponin   LVH (left ventricular hypertrophy)   RECOMMENDATIONS FOR OUTPATIENT FOLLOW UP: 1. Outpatient follow-up with cardiology has been arranged. 2. Discuss elevated A1c with primary care physician  DISCHARGE CONDITION: fair  Diet recommendation: As before  [facility] Weights   10/01/16 0615 10/02/16 0528 10/02/16 1508  Weight: 92 kg (202 lb 12.8 oz) 91.6 kg (201 lb 14.4 oz) 92.1 kg (203 lb 0.7 oz)    INITIAL HISTORY: 56 year old male with a past medical history of end-stage renal disease on hemodialysis, anemia, hypertension, diabetes, GERD, presented with complaints of chest pain, cough. Chest pain had been present on and off  for about 2 weeks. Patient has an appointment to see a cardiologist on the 31st. However, due to worsening symptoms, he decided to come in to the hospital. Patient was found to have pneumonia. He was hospitalized for further management.  Consultations: Cardiology. Nephrology. Infectious disease.  Procedures: Hemodialysis  Transthoracic echocardiogram Study Conclusions - Left ventricle: The cavity size was normal. Systolic function was normal. The estimated ejection fraction was 60%. Moderate concentric and severe focal basal septal hypertrophy. Doppler parameters are consistent with abnormal left ventricular relaxation (grade 1 diastolic dysfunction). - Regional wall motion abnormality: Mild hypokinesis of the mid anteroseptal myocardium. -  Aortic valve: Mildly to moderately calcified annulus. Trileaflet. - Mitral valve: Mildly thickened leaflets . There was moderate regurgitation. - Left atrium: The atrium was moderately to severely dilated.  Lower extremity Dopplers negative for DVT   HOSPITAL COURSE:   Healthcare associated pneumonia  Patient was placed on vancomycin and cefepime. Cultures were sent which are negative so far. Patient was changed over to [facility]. HIV nonreactive. Strep pneumo urinary antigen negative. CT scan was done yesterday to rule out pulmonary embolism. No PE was noted. However CT scan suggested nodular consolidation in the right lung concerning for atypical infection. CT report raised concern for tuberculosis. ID was consulted. Patient does not have any risk factors for tuberculosis. He was taken off of airborne precautions. He will be given 2 more days of cefixime per ID recommendations.  Chest pain Etiology for his chest pain is not entirely clear. He did mention that his chest pain occurred when he was walking to his work. He mentioned pressure-like sensation. However, he also had cough. According to the admitting physician's note, there was some pleuritic component and musculoskeletal component to this pain. Apparently this has been ongoing for about 2 weeks on and off. He does have a slight bump in troponin, which could be due to renal failure, but it is difficult to be certain. EKG with some T wave changes. Echocardiogram as above. There is some wall motion abnormalities. Patient was seen by cardiology. They feel that his echocardiogram changes are mainly due to LVH. They feel his chest pain could be due to pneumonia. He does not need any further cardiac workup at this time. LDL 72. Since there was a pleuritic component to his chest pain . A VQ scan was initially done, which was indeterminate. This was followed  by CT angiogram which was negative for PE. No DVT noted in lower extremity Doppler study.  Outpatient follow-up with cardiology.  End-stage renal disease on hemodialysis on Tuesday, Thursday, Saturday Seen by nephrology. Patient to undergo a vascular surgery procedure to his AV fistula some point in the near future. Patient dialyzed during this hospital stay.   Diabetes mellitus type 2 CBG is a reasonably well-controlled. Patient has not been any glucose lowering agents at home. His HbA1c is 7.1. He will need to discuss this further with his outpatient providers. Will not initiate any agents at this time. Diet control.  Essential hypertension. Continue home medications. Needs good blood pressure control due to presence of LVH. Blood pressure does drop with dialysis, so we need to be cautious.  Normocytic anemia. Most likely due to chronic kidney disease. No evidence for overt bleeding.  Patient apparently does not have a place to live due to some issues with his significant other. Social worker to address this. He may have to go to a shelter. Once this has been addressed he could be discharged.  Overall, stable. Okay for discharge once homelessness situation has been addressed.    PERTINENT LABS:  The results of significant diagnostics from this hospitalization (including imaging, microbiology, ancillary and laboratory) are listed below for reference.    Microbiology: Recent Results (from the past 240 hour(s))  Blood culture (routine x 2)     Status: None (Preliminary result)   Collection Time: 09/29/16  9:45 AM  Result Value Ref Range Status   Specimen Description BLOOD RIGHT HAND  Final   Special Requests BOTTLES DRAWN AEROBIC AND ANAEROBIC 5CC  Final   Culture NO GROWTH 3 DAYS  Final   Report Status PENDING  Incomplete  Blood culture (routine x 2)     Status: None (Preliminary result)   Collection Time: 09/29/16  9:50 AM  Result Value Ref Range Status   Specimen Description BLOOD LEFT ANTECUBITAL  Final   Special Requests BOTTLES DRAWN AEROBIC AND ANAEROBIC   5CC  Final   Culture NO GROWTH 3 DAYS  Final   Report Status PENDING  Incomplete     Labs: Basic Metabolic Panel:  Recent Labs Lab 09/29/16 0820 09/30/16 0451 10/01/16 0242 10/02/16 1606  NA 136 133* 133* 134*  K 4.3 3.8 3.6 3.9  CL 103 97* 97* 99*  CO2 21* 24 24 21*  GLUCOSE 195* 133* 129* 151*  BUN 50* 39* 54* 68*  CREATININE 7.59* 6.39* 7.56* 8.20*  CALCIUM 9.0 8.2* 8.3* 8.1*  PHOS  --   --   --  4.7*   Liver Function Tests:  Recent Labs Lab 09/29/16 0820 10/02/16 1606  AST 18  --   ALT 14*  --   ALKPHOS 72  --   BILITOT 0.9  --   PROT 6.8  --   ALBUMIN 3.0* 2.9*   CBC:  Recent Labs Lab 09/29/16 0820 09/29/16 1408 09/30/16 0451 10/01/16 0242 10/02/16 1606  WBC 8.9 8.3 9.3 7.5 6.6  NEUTROABS 6.3  --   --   --   --   HGB 10.2* 11.2* 10.0* 9.9* 10.0*  HCT 30.8* 33.9* 30.0* 29.8* 29.5*  MCV 98.4 99.1 97.4 96.4 95.8  PLT 304 314 248 246 279   Cardiac Enzymes:  Recent Labs Lab 09/29/16 1408 09/29/16 2241  TROPONINI 0.07* 0.05*   BNP: BNP (last 3 results)  Recent Labs  09/29/16 0820  BNP 503.1*    CBG:  Recent Labs Lab  10/01/16 1129 10/01/16 1731 10/01/16 2150 10/02/16 1015 10/02/16 1225  GLUCAP 182* 182* 113* 167* 133*     IMAGING STUDIES Dg Chest 2 View  Result Date: 10/01/2016 CLINICAL DATA:  Recent pneumonia, cough, left chest pain, diabetes, on dialysis EXAM: CHEST  2 VIEW COMPARISON:  09/29/2016 FINDINGS: Mild cardiomegaly with vascular congestion. Improving right perihilar patchy bronchovascular airspace process compatible with resolving pneumonia. Monitor leads overlie the chest. No current edema, effusion, or pneumothorax. Trachea is midline. IMPRESSION: Cardiomegaly with vascular congestion Improving right upper lung perihilar airspace process/ pneumonia. Electronically Signed   By: Jerilynn Mages.  Shick M.D.   On: 10/01/2016 16:18   Dg Chest 2 View  Result Date: 09/29/2016 CLINICAL DATA:  Pt c/o left side chest pain starting this  morning. Pt states he has had a dry cough, congestion, headache, dizziness, and intermittent fever for 3 days. Patient reports he missed dialysis Thursday. Hx HTN, diabetes, CAD, nonsmoker. EXAM: CHEST  2 VIEW COMPARISON:  03/14/2015 FINDINGS: There is focal opacity projecting above the minor fissure on the frontal view, which may reside in the posterior right upper lobe or superior segment right lower lobe based on the lateral view. This is new from the prior exam and is consistent with pneumonia. Remainder of the lungs is clear. No pleural effusion or pneumothorax. Cardiac silhouette is normal in size. No mediastinal or hilar masses. No convincing adenopathy. Skeletal structures are intact. IMPRESSION: 1. Small area of pneumonia within the posterior right upper lobe or superior segment right lower lobe. No other acute abnormalities. Electronically Signed   By: Lajean Manes M.D.   On: 09/29/2016 09:05   Ct Angio Chest Pe W Or Wo Contrast  Result Date: 10/01/2016 CLINICAL DATA:  56 year old diabetic hypertensive male male on dialysis with persistent cough. Chest pain. Subsequent encounter. EXAM: CT ANGIOGRAPHY CHEST WITH CONTRAST TECHNIQUE: Multidetector CT imaging of the chest was performed using the standard protocol during bolus administration of intravenous contrast. Multiplanar CT image reconstructions and MIPs were obtained to evaluate the vascular anatomy. CONTRAST:  80 cc Isovue 370. COMPARISON:  10/01/2016 chest x-ray. FINDINGS: Cardiovascular:  No pulmonary embolus or aortic dissection. Heart size top-normal.  Coronary artery calcifications. Mediastinum/Nodes: Right hilar adenopathy. Top-normal size subcarinal lymph nodes. Lungs/Pleura: Prominent nodular consolidation right upper lobe. Smaller region of nodular consolidation posteromedial left lower lobe. Upper Abdomen: No worrisome abnormality. Musculoskeletal: No osseous destructive lesion. Mild degenerative changes thoracic spine. Review of the MIP  images confirms the above findings. IMPRESSION: No pulmonary embolus or aortic dissection. Prominent nodular consolidation right upper lobe. Smaller region of nodular consolidation posteromedial left lower lobe.Right hilar adenopathy. Findings suggestive of infectious process possibly atypical infection such as fungal infection or tuberculosis. Coronary artery calcifications. These results will be called to the ordering clinician or representative by the Radiologist Assistant, and communication documented in the PACS or zVision Dashboard. Electronically Signed   By: Genia Del M.D.   On: 10/01/2016 18:55   Nm Pulmonary Perf And Vent  Result Date: 10/01/2016 CLINICAL DATA:  Chest pain. EXAM: NUCLEAR MEDICINE VENTILATION - PERFUSION LUNG SCAN TECHNIQUE: Ventilation images were obtained in multiple projections using inhaled aerosol Tc-57m DTPA. Perfusion images were obtained in multiple projections after intravenous injection of Tc-53m MAA. RADIOPHARMACEUTICALS:  27.9 mCi Technetium-41m DTPA aerosol inhalation and 4.3 mCi Technetium-86m MAA IV COMPARISON:  Chest x-ray 09/29/2016 FINDINGS: Matched ventilation-perfusion defects are noted on the right. These are in the region of previously identified infiltrates. This scan is indeterminate for pulmonary embolus . IMPRESSION:  Matched ventilation-perfusion defects on the right in the region of previously identified infiltrates. Indeterminate scan for pulmonary embolus. Electronically Signed   By: [name]  Register   On: 10/01/2016 16:22    DISCHARGE EXAMINATION: See progress note from earlier today  DISPOSITION: Unclear as of now  Discharge Instructions    Call MD for:  difficulty breathing, headache or visual disturbances    Complete by:  As directed    Call MD for:  extreme fatigue    Complete by:  As directed    Call MD for:  persistant dizziness or light-headedness    Complete by:  As directed    Call MD for:  persistant nausea and vomiting     Complete by:  As directed    Call MD for:  severe uncontrolled pain    Complete by:  As directed    Call MD for:  temperature >100.4    Complete by:  As directed    Discharge instructions    Complete by:  As directed    Please be sure to follow-up with your primary care provider.  You were cared for by a hospitalist during your hospital stay. If you have any questions about your discharge medications or the care you received while you were in the hospital after you are discharged, you can call the unit and asked to speak with the hospitalist on call if the hospitalist that took care of you is not available. Once you are discharged, your primary care physician will handle any further medical issues. Please note that NO REFILLS for any discharge medications will be authorized once you are discharged, as it is imperative that you return to your primary care physician (or establish a relationship with a primary care physician if you do not have one) for your aftercare needs so that they can reassess your need for medications and monitor your lab values. If you do not have a primary care physician, you can call [phone] for a physician referral.   Increase activity slowly    Complete by:  As directed     ALLERGIES: No Known Allergies   Current Discharge Medication List    START taking these medications   Details  benzonatate (TESSALON) 100 MG capsule Take 1 capsule (100 mg total) by mouth 3 (three) times daily as needed for cough. Qty: 20 capsule, Refills: 0    Cefixime (SUPRAX) 400 MG CAPS capsule Take 1 capsule (400 mg total) by mouth daily. Starting 10/03/16 Qty: 2 capsule, Refills: 0    multivitamin (RENA-VIT) TABS tablet Take 1 tablet by mouth at bedtime. Qty: 30 tablet, Refills: 0      CONTINUE these medications which have NOT CHANGED   Details  acetaminophen (TYLENOL) 325 MG tablet Take 325-650 mg by mouth every 6 (six) hours as needed for headache (or pain).    aspirin (BAYER  ASPIRIN) 325 MG tablet Take 325 mg by mouth every 6 (six) hours as needed for headache (or pain).    cinacalcet (SENSIPAR) 30 MG tablet Take 30 mg by mouth at bedtime. Reported on 12/21/2015    lanthanum (FOSRENOL) 1000 MG chewable tablet Chew 1,000 mg by mouth 3 (three) times daily with meals. Reported on 12/21/2015    diclofenac sodium (VOLTAREN) 1 % GEL Apply 4 g topically 4 (four) times daily. Qty: 100 g, Refills: 5   Associated Diagnoses: Trigger finger, left ring finger    losartan (COZAAR) 25 MG tablet Take 25 mg by mouth daily. Reported on 12/21/2015  Follow-up Information    Kerin Ransom, PA-C Follow up on 10/18/2016.   Specialties:  Cardiology, Radiology Why:  at 9:30AM  Contact information: [address] [place] [address] [phone]        Minerva Ends, MD. Schedule an appointment as soon as possible for a visit in 1 week(s).   Specialty:  Family Medicine Contact information: [address] [place] [address] [phone]           TOTAL DISCHARGE TIME: 63 minutes  [facility] Pager [phone]  10/02/2016, 4:48 PM

## 2016-10-02 NOTE — Progress Notes (Signed)
CSW received consult regarding homeless issues. Patient does not want to return to his girlfriend's house. CSW provided shelter list and a buss pass.  CSW signing off.  [name] [name] LCSWA [phone]

## 2016-10-02 NOTE — Consult Note (Addendum)
[facility]       Reason for Consult: pneumonia    Referring Physician: Dr. Maryland Pink  Principal Problem:   HCAP (healthcare-associated pneumonia) Active Problems:   Anemia of chronic renal failure, stage 4 (severe) (HCC)   Type 2 diabetes, controlled, with renal manifestation ([place])   End stage renal disease on dialysis [facility])   Chest pain   Elevated troponin   LVH (left ventricular hypertrophy)   . aspirin EC  325 mg Oral Daily  . cinacalcet  30 mg Oral Q supper  . doxercalciferol  3 mcg Intravenous Q T,Th,Sa-HD  . fluticasone  1 spray Each Nare Daily  . guaiFENesin  600 mg Oral BID  . heparin  5,000 Units Subcutaneous Q8H  . insulin aspart  0-9 Units Subcutaneous TID WC  . lanthanum  1,000 mg Oral TID WC  . [START ON 10/03/2016] levofloxacin  500 mg Oral Q48H  . losartan  25 mg Oral Daily  . multivitamin  1 tablet Oral QHS  . sodium chloride flush  3 mL Intravenous Q12H  . sodium chloride flush  3 mL Intravenous Q12H    Recommendations: Can continue cefixime for 1-2 days starting tomorrow (levaquin still active) I stopped levaquin D/c airborne isolation   Assessment: He has pneumonia resolving by CXR.  Clinically more c/w viral etiology with no fever, normal WBC but possible bacterial with CXR findings as well.  CT scan with nodular opacities c/w pneumonia.  Now though clinically improved without atypical coverage.  No risk factors for Tb.   No history of MDRO so will narrow antibiotics. Quantiferon positivity will not change above recommendations.  HIV negative  Antibiotics: Vancomycin and cefepime Now levaquin x 1 dose 5/46  HPI: [name] is a 56 y.o. male with ESRD on dialysis, substance abuse who came in 1/27 with SOB and CXR with upper right and lower left opacity.  Started on vancomycin and cefepime and now narrowed to levaquin. Influenza negative.  No respiratory viral panel.   Improved since admission.  No previous  MDRO.  Has remained afebrile, no hypoxia.   CT scan independently reviewed and opacities noted, particularly right lobe.  No cavitation noted.  Review of Systems:  Constitutional: negative for fevers and chills Respiratory: negative for sputum or hemoptysis Gastrointestinal: negative for diarrhea Integument/breast: negative for rash All other systems reviewed and are negative    Past Medical History:  Diagnosis Date  . Anemia   . Cellulitis and abscess of foot 08/24/2011  . CHF (congestive heart failure) ([place]) 2015  . Chronic renal disease, stage IV ([place]) 2015  . Cocaine abuse   . Constipation   . Diabetes mellitus 2015   type 2  . Diastolic congestive heart failure ([place])   . GERD (gastroesophageal reflux disease)    unsure  . Headache   . Hypertension 2015  . Multiple myeloma ([place])   . Protein calorie malnutrition ([place])   . PVD (peripheral vascular disease) ([place])   . Shortness of breath dyspnea     Social History  Substance Use Topics  . Smoking status: Never Smoker  . Smokeless tobacco: Never Used  . Alcohol use No     Comment: once in a while per patient "beer/brandy"    Family History  Problem Relation Age of Onset  . Adopted: Yes  . Varicose Veins Sister     No Known Allergies  Physical Exam: Constitutional: in no apparent distress and alert  Vitals:  10/01/16 2159 10/02/16 0528  BP: (!) 178/90 (!) 158/83  Pulse: 87 88  Resp: 18 18  Temp: 97.7 F (36.5 C) 98.4 F (36.9 C)   EYES: anicteric ENMT: no thrush Cardiovascular: Cor RRR Respiratory: CTA B; normal respiratory effort GI: Bowel sounds are normal, liver is not enlarged, spleen is not enlarged Musculoskeletal: no pedal edema noted Skin: negatives: no rash Neuro: non-focal  Lab Results  Component Value Date   WBC 7.5 10/01/2016   HGB 9.9 (L) 10/01/2016   HCT 29.8 (L) 10/01/2016   MCV 96.4 10/01/2016   PLT 246 10/01/2016    Lab Results  Component Value Date   CREATININE 7.56 (H)  10/01/2016   BUN 54 (H) 10/01/2016   NA 133 (L) 10/01/2016   K 3.6 10/01/2016   CL 97 (L) 10/01/2016   CO2 24 10/01/2016    Lab Results  Component Value Date   ALT 14 (L) 09/29/2016   AST 18 09/29/2016   ALKPHOS 72 09/29/2016     Microbiology: Recent Results (from the past 240 hour(s))  Blood culture (routine x 2)     Status: None (Preliminary result)   Collection Time: 09/29/16  9:45 AM  Result Value Ref Range Status   Specimen Description BLOOD RIGHT HAND  Final   Special Requests BOTTLES DRAWN AEROBIC AND ANAEROBIC 5CC  Final   Culture NO GROWTH 2 DAYS  Final   Report Status PENDING  Incomplete  Blood culture (routine x 2)     Status: None (Preliminary result)   Collection Time: 09/29/16  9:50 AM  Result Value Ref Range Status   Specimen Description BLOOD LEFT ANTECUBITAL  Final   Special Requests BOTTLES DRAWN AEROBIC AND ANAEROBIC  5CC  Final   Culture NO GROWTH 2 DAYS  Final   Report Status PENDING  Incomplete    Scharlene Gloss, [place] [facility] Infectious [facility] [email or web address] [phone] pager  [phone] cell 10/02/2016, 10:40 AM

## 2016-10-03 ENCOUNTER — Inpatient Hospital Stay (HOSPITAL_COMMUNITY): Payer: Medicare Other

## 2016-10-03 ENCOUNTER — Ambulatory Visit: Payer: Medicare Other | Admitting: Physician Assistant

## 2016-10-03 DIAGNOSIS — E1122 Type 2 diabetes mellitus with diabetic chronic kidney disease: Secondary | ICD-10-CM | POA: Diagnosis not present

## 2016-10-03 DIAGNOSIS — Z992 Dependence on renal dialysis: Secondary | ICD-10-CM | POA: Diagnosis not present

## 2016-10-03 DIAGNOSIS — N186 End stage renal disease: Secondary | ICD-10-CM | POA: Diagnosis not present

## 2016-10-03 LAB — GLUCOSE, CAPILLARY
GLUCOSE-CAPILLARY: 150 mg/dL — AB (ref 65–99)
Glucose-Capillary: 189 mg/dL — ABNORMAL HIGH (ref 65–99)

## 2016-10-03 NOTE — Progress Notes (Signed)
Pt has orders to be discharged. Discharge instructions given and pt has no additional questions at this time.  Pt does not want RN to go over instructions, he states that he will read them.  Pt upset that the hospital will not allow him to stay another night since he is homeless and cannot get into a shelter until tomorrow. Telemetry box removed. IV removed and site in good condition. Pt stable and waiting for transportation.  Clarise Cruz RN

## 2016-10-03 NOTE — Progress Notes (Signed)
[facility] Progress Note    Assessment/ Plan:   Dialysis Orders: TTS [place] 4 hr EDW 90 2K 2.5 Ca AVF heparin 6000 with 2000 mid tmt venofer 50 per week Mircera 50 q 4 weeks last 1/16 hectorol 2 right upper AVF Recent labs:hgb 11 stable 41% sat ferritin 1214 iPTH 717 corr Ca 9.8 P 5.9 Access issues:evaluated by Dr. Bridgett Larsson 1/19 - has stenotic prox ceph vein - needs turndown procedure - but needs cardiology eval first due to angina like sx  Assessment/Plan: 1. HCAP with chest pain- vanc and cefepime per primary, culturespending/work up per primary; does not look acutely ill; [facility] wants cardiology eval before surgery Had appt 1/31 - may be done while in hosp per primary note yesterday 2. ESRD- TTS - next HD Thursday if he's still here but looks like he may be d/c today pending social situation. - waiting for clearance prior to swinging the arch down to the axillary vein (vascular access for hd).  - per Dr. Percival Spanish --> outpt stress testing  3. Hypertension/volume- BP controlled with volume removal and low dose losartan- net UF 4 L Sat - post st 91.2- BP somewhat variable - get orthostatics pre and post HD- goal for EDW 4. Anemia- hgb 9.9down some from outpt labs - follow - stop Fe for now due to ^ ferritin and adequate tsat - not due for ESA for 2 weeks - follow trending - if hgb drops further can redose sooner 5. Metabolic bone disease- Continue hectorol -- actually need to ^ hectorol to 3 and change bath to 2.25 at discharge; continue sensipar and fosrenol 6. Nutrition- renal diet/vits           7. Significant psycho social stressorsoutpt dialysis SW is aware                     8.    Pleuritic CP - suspicious CXR being r/o for TB.  Subjective:   Denies cp/f/c/n/v/dyspnea. Good appetite   Objective:   BP (!) 150/80 (BP Location: Left Arm)   Pulse 79   Temp 98.6 F (37 C) (Oral)   Resp 18   Ht 5\' 11"  (1.803 m)   Wt 89.2 kg (196 lb 9.6  oz)   SpO2 98%   BMI 27.42 kg/m   Intake/Output Summary (Last 24 hours) at 10/03/16 1040 Last data filed at 10/02/16 1904  Gross per 24 hour  Intake              240 ml  Output             1850 ml  Net            -1610 ml   Weight change: 0.519 kg (1 lb 2.3 oz)  Physical Exam: General: NAD Heart: RRR Lungs: no rales Abdomen: soft NT  Extremities: no LE edema Dialysis Access: right upper BCF + bruit, pulsatile  Imaging: Dg Chest 2 View  Result Date: 10/01/2016 CLINICAL DATA:  Recent pneumonia, cough, left chest pain, diabetes, on dialysis EXAM: CHEST  2 VIEW COMPARISON:  09/29/2016 FINDINGS: Mild cardiomegaly with vascular congestion. Improving right perihilar patchy bronchovascular airspace process compatible with resolving pneumonia. Monitor leads overlie the chest. No current edema, effusion, or pneumothorax. Trachea is midline. IMPRESSION: Cardiomegaly with vascular congestion Improving right upper lung perihilar airspace process/ pneumonia. Electronically Signed   By: Jerilynn Mages.  Shick M.D.   On: 10/01/2016 16:18   Ct Angio Chest Pe W Or Wo Contrast  Result Date: 10/01/2016 CLINICAL DATA:  56 year old diabetic hypertensive male male on dialysis with persistent cough. Chest pain. Subsequent encounter. EXAM: CT ANGIOGRAPHY CHEST WITH CONTRAST TECHNIQUE: Multidetector CT imaging of the chest was performed using the standard protocol during bolus administration of intravenous contrast. Multiplanar CT image reconstructions and MIPs were obtained to evaluate the vascular anatomy. CONTRAST:  80 cc Isovue 370. COMPARISON:  10/01/2016 chest x-ray. FINDINGS: Cardiovascular:  No pulmonary embolus or aortic dissection. Heart size top-normal.  Coronary artery calcifications. Mediastinum/Nodes: Right hilar adenopathy. Top-normal size subcarinal lymph nodes. Lungs/Pleura: Prominent nodular consolidation right upper lobe. Smaller region of nodular consolidation posteromedial left lower lobe. Upper Abdomen:  No worrisome abnormality. Musculoskeletal: No osseous destructive lesion. Mild degenerative changes thoracic spine. Review of the MIP images confirms the above findings. IMPRESSION: No pulmonary embolus or aortic dissection. Prominent nodular consolidation right upper lobe. Smaller region of nodular consolidation posteromedial left lower lobe.Right hilar adenopathy. Findings suggestive of infectious process possibly atypical infection such as fungal infection or tuberculosis. Coronary artery calcifications. These results will be called to the ordering clinician or representative by the Radiologist Assistant, and communication documented in the PACS or zVision Dashboard. Electronically Signed   By: Genia Del M.D.   On: 10/01/2016 18:55   Nm Pulmonary Perf And Vent  Result Date: 10/01/2016 CLINICAL DATA:  Chest pain. EXAM: NUCLEAR MEDICINE VENTILATION - PERFUSION LUNG SCAN TECHNIQUE: Ventilation images were obtained in multiple projections using inhaled aerosol Tc-88m DTPA. Perfusion images were obtained in multiple projections after intravenous injection of Tc-65m MAA. RADIOPHARMACEUTICALS:  27.9 mCi Technetium-48m DTPA aerosol inhalation and 4.3 mCi Technetium-64m MAA IV COMPARISON:  Chest x-ray 09/29/2016 FINDINGS: Matched ventilation-perfusion defects are noted on the right. These are in the region of previously identified infiltrates. This scan is indeterminate for pulmonary embolus . IMPRESSION: Matched ventilation-perfusion defects on the right in the region of previously identified infiltrates. Indeterminate scan for pulmonary embolus. Electronically Signed   By: [name]  Register   On: 10/01/2016 16:22    Labs: BMET  Recent Labs Lab 09/29/16 0820 09/30/16 0451 10/01/16 0242 10/02/16 1606  NA 136 133* 133* 134*  K 4.3 3.8 3.6 3.9  CL 103 97* 97* 99*  CO2 21* 24 24 21*  GLUCOSE 195* 133* 129* 151*  BUN 50* 39* 54* 68*  CREATININE 7.59* 6.39* 7.56* 8.20*  CALCIUM 9.0 8.2* 8.3* 8.1*   PHOS  --   --   --  4.7*   CBC  Recent Labs Lab 09/29/16 0820 09/29/16 1408 09/30/16 0451 10/01/16 0242 10/02/16 1606  WBC 8.9 8.3 9.3 7.5 6.6  NEUTROABS 6.3  --   --   --   --   HGB 10.2* 11.2* 10.0* 9.9* 10.0*  HCT 30.8* 33.9* 30.0* 29.8* 29.5*  MCV 98.4 99.1 97.4 96.4 95.8  PLT 304 314 248 246 279    Medications:    . aspirin EC  325 mg Oral Daily  . Cefixime  400 mg Oral Daily  . cinacalcet  30 mg Oral Q supper  . doxercalciferol  3 mcg Intravenous Q T,Th,Sa-HD  . fluticasone  1 spray Each Nare Daily  . guaiFENesin  600 mg Oral BID  . heparin  5,000 Units Subcutaneous Q8H  . insulin aspart  0-9 Units Subcutaneous TID WC  . lanthanum  1,000 mg Oral TID WC  . losartan  25 mg Oral Daily  . multivitamin  1 tablet Oral QHS  . sodium chloride flush  3 mL Intravenous Q12H  . sodium  chloride flush  3 mL Intravenous Q12H      Otelia Santee, MD 10/03/2016, 10:40 AM

## 2016-10-03 NOTE — Clinical Social Work Note (Signed)
Patient reports that he has a bed at [facility] tomorrow night but wants to stay in the hospital tonight so that he can get dialysis in the morning. CSW called and left a voicemail with [facility] [place] to see if they have a bed available tonight. Per RN, the MD told her yesterday that dialysis was not a sufficient reason to stay in the hospital when patient is medically stable. CSW provided another shelter list and let him know that at this time, there were no other options. Discussed how insurance stops paying when patient is discharged. Patient expressed understanding.  CSW signing off. Consult again if any social work needs arise.  Dayton Scrape, [place]

## 2016-10-03 NOTE — Progress Notes (Signed)
Pt seen and examined at bedside, please see discharge summary by Dr. Maryland Pink 10/02/2016. No Changes needed. CXR pending and if looks stable, pt can go home today.   Faye Ramsay, MD  [facility] Pager [phone]  If 7PM-7AM, please contact night-coverage [email or web address] Password [email or web address]

## 2016-10-03 NOTE — Care Management Important Message (Signed)
Important Message  Patient Details  Name: [name] MRN: [id number] Date of Birth: February 15, 1961   Medicare Important Message Given:  Yes    Neriah Brott 10/03/2016, 12:32 PM

## 2016-10-04 DIAGNOSIS — Z23 Encounter for immunization: Secondary | ICD-10-CM | POA: Diagnosis not present

## 2016-10-04 DIAGNOSIS — E1029 Type 1 diabetes mellitus with other diabetic kidney complication: Secondary | ICD-10-CM | POA: Diagnosis not present

## 2016-10-04 DIAGNOSIS — N2581 Secondary hyperparathyroidism of renal origin: Secondary | ICD-10-CM | POA: Diagnosis not present

## 2016-10-04 DIAGNOSIS — D631 Anemia in chronic kidney disease: Secondary | ICD-10-CM | POA: Diagnosis not present

## 2016-10-04 DIAGNOSIS — N186 End stage renal disease: Secondary | ICD-10-CM | POA: Diagnosis not present

## 2016-10-04 DIAGNOSIS — I5031 Acute diastolic (congestive) heart failure: Secondary | ICD-10-CM | POA: Diagnosis not present

## 2016-10-04 DIAGNOSIS — J189 Pneumonia, unspecified organism: Secondary | ICD-10-CM

## 2016-10-04 DIAGNOSIS — Z283 Underimmunization status: Secondary | ICD-10-CM | POA: Diagnosis not present

## 2016-10-04 HISTORY — DX: Pneumonia, unspecified organism: J18.9

## 2016-10-04 LAB — QUANTIFERON IN TUBE
QFT TB AG MINUS NIL VALUE: 0 [IU]/mL
QUANTIFERON MITOGEN VALUE: 6.99 IU/mL
QUANTIFERON NIL VALUE: 0.06 [IU]/mL
QUANTIFERON TB AG VALUE: 0.06 [IU]/mL
QUANTIFERON TB GOLD: NEGATIVE

## 2016-10-04 LAB — CULTURE, BLOOD (ROUTINE X 2)
CULTURE: NO GROWTH
CULTURE: NO GROWTH

## 2016-10-04 LAB — QUANTIFERON TB GOLD ASSAY (BLOOD)

## 2016-10-05 ENCOUNTER — Telehealth: Payer: Self-pay | Admitting: Cardiology

## 2016-10-05 DIAGNOSIS — R079 Chest pain, unspecified: Secondary | ICD-10-CM

## 2016-10-05 NOTE — Telephone Encounter (Signed)
[name] called to see if pt needs stress test. His f/u is scheduled w Lurena Joiner on 2/12. Please advise if this will be determined at time of appointment.

## 2016-10-05 NOTE — Telephone Encounter (Signed)
New message      Calling to check on stress test appt that was in pt's hosp note.  No order is in the computer.  Please check and see if pt is to have a stress test.  If yes, please put in order so that test can be scheduled.  You do not have to call back unless pt is not to have stress test

## 2016-10-05 NOTE — Telephone Encounter (Signed)
ASSESSMENT/PLAN:   # Chest pain: # Elevated troponin: [name] has atypical chest pain in the setting of HCAP.  Troponin very mildly eelvated to 0.07, which is lower than when last checked in 2016.  EKG is without findings of ischemia.  Will plan for outpatient follow up and likely stress testing.  Continue aspirin.  LDL 72 this admission.  # Chronic diastolic heart failure: Currently euvolemic.  Echo shows moderate hypertrophy of the posterior wall and severe septal hypertrophy.  However, on my independent review of his echo, it appears that he has mild-moderate concentric hypertrophy. There was no evidence of SAM or increased LVOT gradients.  Will not do this in the setting of acute illness and wheezing.     # Hypertension: BP well-controlled on losartan.      Signed: Tiffany C. Oval Linsey, MD, [facility]  09/30/2016, 2:20 PM

## 2016-10-06 DIAGNOSIS — N186 End stage renal disease: Secondary | ICD-10-CM | POA: Diagnosis not present

## 2016-10-06 DIAGNOSIS — I5031 Acute diastolic (congestive) heart failure: Secondary | ICD-10-CM | POA: Diagnosis not present

## 2016-10-06 DIAGNOSIS — Z283 Underimmunization status: Secondary | ICD-10-CM | POA: Diagnosis not present

## 2016-10-06 DIAGNOSIS — N2581 Secondary hyperparathyroidism of renal origin: Secondary | ICD-10-CM | POA: Diagnosis not present

## 2016-10-06 DIAGNOSIS — E1029 Type 1 diabetes mellitus with other diabetic kidney complication: Secondary | ICD-10-CM | POA: Diagnosis not present

## 2016-10-06 DIAGNOSIS — D631 Anemia in chronic kidney disease: Secondary | ICD-10-CM | POA: Diagnosis not present

## 2016-10-08 NOTE — Telephone Encounter (Signed)
Please schedule outpatient Lexiscan Myoview prior to follow up.

## 2016-10-09 DIAGNOSIS — N2581 Secondary hyperparathyroidism of renal origin: Secondary | ICD-10-CM | POA: Diagnosis not present

## 2016-10-09 DIAGNOSIS — Z283 Underimmunization status: Secondary | ICD-10-CM | POA: Diagnosis not present

## 2016-10-09 DIAGNOSIS — D631 Anemia in chronic kidney disease: Secondary | ICD-10-CM | POA: Diagnosis not present

## 2016-10-09 DIAGNOSIS — I5031 Acute diastolic (congestive) heart failure: Secondary | ICD-10-CM | POA: Diagnosis not present

## 2016-10-09 DIAGNOSIS — N186 End stage renal disease: Secondary | ICD-10-CM | POA: Diagnosis not present

## 2016-10-09 DIAGNOSIS — E1029 Type 1 diabetes mellitus with other diabetic kidney complication: Secondary | ICD-10-CM | POA: Diagnosis not present

## 2016-10-09 NOTE — Telephone Encounter (Signed)
Order placed in [place], message sent to schedulers, Claiborne Billings at [facility], and spoke with patient to let him know someone would be calling to schedule

## 2016-10-10 ENCOUNTER — Encounter (INDEPENDENT_AMBULATORY_CARE_PROVIDER_SITE_OTHER): Payer: Self-pay | Admitting: Orthopedic Surgery

## 2016-10-10 ENCOUNTER — Telehealth: Payer: Self-pay | Admitting: Cardiology

## 2016-10-10 ENCOUNTER — Ambulatory Visit (INDEPENDENT_AMBULATORY_CARE_PROVIDER_SITE_OTHER): Payer: Medicare Other | Admitting: Orthopedic Surgery

## 2016-10-10 ENCOUNTER — Telehealth (HOSPITAL_COMMUNITY): Payer: Self-pay

## 2016-10-10 DIAGNOSIS — M65342 Trigger finger, left ring finger: Secondary | ICD-10-CM | POA: Diagnosis not present

## 2016-10-10 DIAGNOSIS — M1711 Unilateral primary osteoarthritis, right knee: Secondary | ICD-10-CM | POA: Diagnosis not present

## 2016-10-10 DIAGNOSIS — I208 Other forms of angina pectoris: Secondary | ICD-10-CM | POA: Diagnosis not present

## 2016-10-10 NOTE — Telephone Encounter (Signed)
Encounter complete. 

## 2016-10-10 NOTE — Progress Notes (Signed)
Office Visit Note   Patient: [name]           Date of Birth: 01-14-1961           MRN: [id number] Visit Date: 10/10/2016 Requested by: [name], MD [address] [place], [place] [address] PCP: Minerva Ends, MD  Subjective: Chief Complaint  Patient presents with  . Right Knee - Follow-up  . 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  Patient states that problems have improved significantly since his interventions.  He's having no problems currently he is not taking any medications.  For these problems.              Review of Systems All systems reviewed are negative as they relate to the chief complaint within the history of present illness.  Patient denies  fevers or chills.    Assessment & Plan: Visit Diagnoses:  1. Primary osteoarthritis of right knee   2. Trigger finger, left ring finger     Plan: Impression is improvement in right knee arthritis from Synvisc injection and current resolution of trigger finger left ring following ultrasound-guided injection.  He's doing well.  He is able to work.  I am going to release him at this time and follow-up as needed  Follow-Up Instructions: No Follow-up on file.   Orders:  No orders of the defined types were placed in this encounter.  No orders of the defined types were placed in this encounter.     Procedures: No procedures performed   Clinical Data: No additional findings.  Objective: Vital Signs: There were no vitals taken for this visit.  Physical Exam   Constitutional: Patient appears well-developed HEENT:  Head: Normocephalic Eyes:EOM are normal Neck: Normal range of motion Cardiovascular: Normal rate Pulmonary/chest: Effort normal Neurologic: Patient is alert Skin: Skin is warm Psychiatric: Patient  has normal mood and affect    Ortho Exam examination the right knee demonstrates surgical incisions on the right knee which are well-healed no effusion is present range of motion is good collateral patient's are stable.  No groin pain with internal/external rotation on the right-hand side.  Left forefinger has no tenderness over the A1 pulley and no triggering.  Specialty Comments:  No specialty comments available.  Imaging: No results found.   PMFS History: Patient Active Problem List   Diagnosis Date Noted  . Primary osteoarthritis of right knee 10/10/2016  . Elevated troponin   . LVH (left ventricular hypertrophy)   . HCAP (healthcare-associated pneumonia) 09/29/2016  . Pneumonia 09/29/2016  . Chronic pain of right knee 08/08/2016  . Mechanical knee pain, right 07/20/2016  . Trigger finger, left ring finger 07/20/2016  . Chronic bilateral low back pain without sciatica 07/20/2016  . Chest pain 12/21/2015  . Poor dentition 07/06/2015  . Lateral pain of left hip 07/06/2015  . Erectile dysfunction 07/06/2015  . End stage renal disease on dialysis ([place]) 03/15/2015  . Major depressive disorder, single episode, moderate ([place]) 02/09/2015  . Stimulant use disorder (cocaine) 02/09/2015  . Alcohol use disorder, moderate, dependence ([place]) 02/09/2015  . Chronic diastolic heart failure ([place])   . IgA monoclonal gammopathy 12/28/2014  . GERD (gastroesophageal reflux disease) 11/08/2014  . Type 2 diabetes, controlled, with renal manifestation ([place]) 09/06/2013  . Essential hypertension 08/27/2011  .  Anemia of chronic renal failure, stage 4 (severe) ([place]) 08/27/2011   Past Medical History:  Diagnosis Date  . Anemia   . Cellulitis and abscess of foot 08/24/2011  . CHF (congestive heart failure) ([place]) 2015  . Chronic renal disease, stage IV ([place]) 2015  . Cocaine abuse   . Constipation   . Diabetes mellitus 2015   type 2  . Diastolic congestive heart failure ([place])   . GERD  (gastroesophageal reflux disease)    unsure  . Headache   . Hypertension 2015  . Protein calorie malnutrition ([place])   . PVD (peripheral vascular disease) ([place])   . Shortness of breath dyspnea     Family History  Problem Relation Age of Onset  . Adopted: Yes  . Varicose Veins Sister     Past Surgical History:  Procedure Laterality Date  . AMPUTATION  09/06/2011   Procedure: AMPUTATION RAY;  Surgeon: Wylene Simmer, MD;  Location: [facility];  Service: Orthopedics;  Laterality: Left;  Left Hallux Amputation  . APPENDECTOMY    . AV FISTULA PLACEMENT Left 08/12/2014   Procedure: CREATION OF A BRACHIOCEPHALIC ARTERIOVENOUS (AV) FISTULA  LEFT ARM;  Surgeon: Mal Misty, MD;  Location: [facility];  Service: Vascular;  Laterality: Left;  . AV FISTULA PLACEMENT Right 10/07/2014   Procedure: Creation Right Arm Arteriovenous fistula;  Surgeon: Mal Misty, MD;  Location: [facility];  Service: Vascular;  Laterality: Right;  . KNEE SURGERY Right    "metal plate and screws" per patient   . LEG SURGERY     Social History   Occupational History  . Not on file.   Social History Main Topics  . Smoking status: Never Smoker  . Smokeless tobacco: Never Used  . Alcohol use No     Comment: once in a while per patient "beer/brandy"  . Drug use: No     Comment: per pt* smoked marijuana as a teenager and last cocaine use was two months ago  . Sexual activity: No

## 2016-10-10 NOTE — Telephone Encounter (Signed)
New Message     Returning your call ,about procedure

## 2016-10-11 DIAGNOSIS — N2581 Secondary hyperparathyroidism of renal origin: Secondary | ICD-10-CM | POA: Diagnosis not present

## 2016-10-11 DIAGNOSIS — Z283 Underimmunization status: Secondary | ICD-10-CM | POA: Diagnosis not present

## 2016-10-11 DIAGNOSIS — I5031 Acute diastolic (congestive) heart failure: Secondary | ICD-10-CM | POA: Diagnosis not present

## 2016-10-11 DIAGNOSIS — D631 Anemia in chronic kidney disease: Secondary | ICD-10-CM | POA: Diagnosis not present

## 2016-10-11 DIAGNOSIS — N186 End stage renal disease: Secondary | ICD-10-CM | POA: Diagnosis not present

## 2016-10-11 DIAGNOSIS — E1029 Type 1 diabetes mellitus with other diabetic kidney complication: Secondary | ICD-10-CM | POA: Diagnosis not present

## 2016-10-12 ENCOUNTER — Ambulatory Visit (HOSPITAL_COMMUNITY)
Admission: RE | Admit: 2016-10-12 | Discharge: 2016-10-12 | Disposition: A | Discharge status: Home / Self Care | Payer: Medicare Other | Source: Ambulatory Visit | Attending: Cardiovascular Disease | Admitting: Cardiovascular Disease

## 2016-10-12 ENCOUNTER — Telehealth (INDEPENDENT_AMBULATORY_CARE_PROVIDER_SITE_OTHER): Payer: Self-pay | Admitting: Radiology

## 2016-10-12 DIAGNOSIS — R079 Chest pain, unspecified: Secondary | ICD-10-CM | POA: Diagnosis not present

## 2016-10-12 DIAGNOSIS — I501 Left ventricular failure: Secondary | ICD-10-CM | POA: Insufficient documentation

## 2016-10-12 LAB — MYOCARDIAL PERFUSION IMAGING
CHL CUP NUCLEAR SRS: 2
CHL CUP NUCLEAR SSS: 5
LV sys vol: 100 mL
LVDIAVOL: 170 mL (ref 62–150)
NUC STRESS TID: 1.09
Peak HR: 114 {beats}/min
Rest HR: 75 {beats}/min
SDS: 3

## 2016-10-12 MED ORDER — TECHNETIUM TC 99M TETROFOSMIN IV KIT
28.2000 | PACK | Freq: Once | INTRAVENOUS | Status: AC | PRN
  Administered 2016-10-12: 28.2 via INTRAVENOUS
  Filled 2016-10-12: qty 29

## 2016-10-12 MED ORDER — AMINOPHYLLINE 25 MG/ML IV SOLN
75.0000 mg | Freq: Once | INTRAVENOUS | Status: AC
  Administered 2016-10-12: 75 mg via INTRAVENOUS

## 2016-10-12 MED ORDER — TECHNETIUM TC 99M TETROFOSMIN IV KIT
9.6000 | PACK | Freq: Once | INTRAVENOUS | Status: AC | PRN
  Administered 2016-10-12: 9.6 via INTRAVENOUS
  Filled 2016-10-12: qty 10

## 2016-10-12 MED ORDER — REGADENOSON 0.4 MG/5ML IV SOLN
0.4000 mg | Freq: Once | INTRAVENOUS | Status: AC
  Administered 2016-10-12: 0.4 mg via INTRAVENOUS

## 2016-10-12 NOTE — Telephone Encounter (Signed)
Call patient and advise we can proceed with Monovisc injection. Medicare is primary, and Medicaid is secondary.

## 2016-10-13 DIAGNOSIS — D631 Anemia in chronic kidney disease: Secondary | ICD-10-CM | POA: Diagnosis not present

## 2016-10-13 DIAGNOSIS — E1029 Type 1 diabetes mellitus with other diabetic kidney complication: Secondary | ICD-10-CM | POA: Diagnosis not present

## 2016-10-13 DIAGNOSIS — Z283 Underimmunization status: Secondary | ICD-10-CM | POA: Diagnosis not present

## 2016-10-13 DIAGNOSIS — N186 End stage renal disease: Secondary | ICD-10-CM | POA: Diagnosis not present

## 2016-10-13 DIAGNOSIS — N2581 Secondary hyperparathyroidism of renal origin: Secondary | ICD-10-CM | POA: Diagnosis not present

## 2016-10-13 DIAGNOSIS — I5031 Acute diastolic (congestive) heart failure: Secondary | ICD-10-CM | POA: Diagnosis not present

## 2016-10-15 ENCOUNTER — Ambulatory Visit (INDEPENDENT_AMBULATORY_CARE_PROVIDER_SITE_OTHER): Payer: Medicare Other | Admitting: Cardiology

## 2016-10-15 ENCOUNTER — Encounter: Payer: Self-pay | Admitting: Cardiology

## 2016-10-15 DIAGNOSIS — Z992 Dependence on renal dialysis: Secondary | ICD-10-CM | POA: Diagnosis not present

## 2016-10-15 DIAGNOSIS — E1122 Type 2 diabetes mellitus with diabetic chronic kidney disease: Secondary | ICD-10-CM | POA: Diagnosis not present

## 2016-10-15 DIAGNOSIS — N186 End stage renal disease: Secondary | ICD-10-CM | POA: Diagnosis not present

## 2016-10-15 DIAGNOSIS — I10 Essential (primary) hypertension: Secondary | ICD-10-CM

## 2016-10-15 DIAGNOSIS — R079 Chest pain, unspecified: Secondary | ICD-10-CM

## 2016-10-15 DIAGNOSIS — I43 Cardiomyopathy in diseases classified elsewhere: Secondary | ICD-10-CM | POA: Diagnosis not present

## 2016-10-15 DIAGNOSIS — I119 Hypertensive heart disease without heart failure: Secondary | ICD-10-CM

## 2016-10-15 DIAGNOSIS — I208 Other forms of angina pectoris: Secondary | ICD-10-CM

## 2016-10-15 DIAGNOSIS — I5032 Chronic diastolic (congestive) heart failure: Secondary | ICD-10-CM | POA: Diagnosis not present

## 2016-10-15 MED ORDER — AMLODIPINE BESYLATE 5 MG PO TABS: 5.0000 mg | ORAL_TABLET | Freq: Every day | ORAL | 3 refills | Status: DC

## 2016-10-15 MED FILL — AMLODIPINE BESYLATE 5 MG TA: 5 | 30 days supply | Qty: 30 | Fill #0

## 2016-10-15 NOTE — Progress Notes (Signed)
[id number] [name]   [id number]  [id number]  Primary Physician Minerva Ends, MD Primary Cardiologist: Dr Oval Linsey  HPI:  56 y/o male, previously followed by Dr Verl Blalock, now Dr Oval Linsey. He has a history of HTN, HCVD, ESRD on HD, and NIDDM. He was admitted to [facility] 09/29/16 with CAP. He had chest pain with a slightly elevated Troponin-0.07. Echo showed EF 60% with moderate LVH. It was felt his chest pain was from pneumonia. He had an OP Myoview that showed no ischemia, his EF was 41% but it was felt to be higher and correlation with echo was recommended.   The pt is in the office today for follow up. He still has some chest discomfort, now described as localized Lt chest wall pain, worse with palpation and deep inspiration. His B/P is elevated-162/ 98.    Current Outpatient Prescriptions  Medication Sig Dispense Refill  . cinacalcet (SENSIPAR) 30 MG tablet Take 30 mg by mouth at bedtime. Reported on 12/21/2015    . lanthanum (FOSRENOL) 1000 MG chewable tablet Chew 1,000 mg by mouth 3 (three) times daily with meals. Reported on 12/21/2015    . losartan (COZAAR) 25 MG tablet Take 25 mg by mouth daily. Reported on 12/21/2015    . amLODipine (NORVASC) 5 MG tablet Take 1 tablet (5 mg total) by mouth daily. 30 tablet 3   No current facility-administered medications for this visit.     No Known Allergies  Social History   Social History  . Marital status: Legally Separated    Spouse name: N/A  . Number of children: N/A  . Years of education: N/A   Occupational History  . Not on file.   Social History Main Topics  . Smoking status: Never Smoker  . Smokeless tobacco: Never Used  . Alcohol use No     Comment: once in a while per patient "beer/brandy"  . Drug use: No     Comment: per pt* smoked marijuana as a teenager and last cocaine use was two months ago  . Sexual activity: No   Other Topics Concern  . Not on file   Social History Narrative  . No narrative on  file     Review of Systems: General: negative for chills, fever, night sweats or weight changes.  Cardiovascular: negative for dyspnea on exertion, edema, orthopnea, palpitations, paroxysmal nocturnal dyspnea or shortness of breath Dermatological: negative for rash Respiratory: negative for cough or wheezing Urologic: negative for hematuria Abdominal: negative for nausea, vomiting, diarrhea, bright red blood per rectum, melena, or hematemesis Neurologic: negative for visual changes, syncope, or dizziness All other systems reviewed and are otherwise negative except as noted above.    Blood pressure (!) 168/100, pulse 83, height 5\' 11"  (1.803 m), weight 205 lb 3.2 oz (93.1 kg).  General appearance: alert, cooperative and no distress Neck: no carotid bruit and no JVD Lungs: clear to auscultation bilaterally Heart: regular rate and rhythm Extremities: RUE AVF, no LE edema Skin: Skin color, texture, turgor normal. No rashes or lesions Neurologic: Grossly normal  EKG NSR  ASSESSMENT AND PLAN:   Chest pain with moderate risk of acute coronary syndrome Pt had c/p in setting of CAP Jan 2018. Troponin slightly positive- 0.07 pk. Myoview low risk 10/05/16. Pt's chest discomfort today sounds atypical- localized, worse with palpation and movement  Chronic diastolic heart failure (HCC) Grade 1 DD on echo-no evidence of CHF on exam today  Essential hypertension Uncontrolled- I added Norvasc 5 mg  Hypertensive cardiomyopathy, without heart failure ([place]) Echo Jan 2018- EF 60% with moderate LVH, grade 1 DD  Non-insulin-dependent diabetes mellitus with renal complications (HCC) Diet controlled-Hgb A!c-7.1 in Jan 2018  End stage renal disease on dialysis [facility]) T-Th- Sat HD at [address] Dr Mercy Moore follows   PLAN  I added Amlodipine 5mg  for HTN. I'll arrange for him to f/u with Dr Oval Linsey in a few weeks.   Kerin Ransom PA-C 10/15/2016 10:08 AM

## 2016-10-15 NOTE — Assessment & Plan Note (Signed)
T-Th- Sat HD at [address] Dr Mercy Moore follows

## 2016-10-15 NOTE — Assessment & Plan Note (Signed)
Grade 1 DD on echo-no evidence of CHF on exam today

## 2016-10-15 NOTE — Assessment & Plan Note (Signed)
Echo Jan 2018- EF 60% with moderate LVH, grade 1 DD

## 2016-10-15 NOTE — Assessment & Plan Note (Signed)
Uncontrolled- I added Norvasc 5 mg

## 2016-10-15 NOTE — Assessment & Plan Note (Signed)
Pt had c/p in setting of CAP Jan 2018. Troponin slightly positive- 0.07 pk. Myoview low risk 10/05/16. Pt's chest discomfort today sounds atypical- localized, worse with palpation and movement

## 2016-10-15 NOTE — Assessment & Plan Note (Signed)
Diet controlled-Hgb A!c-7.1 in Jan 2018

## 2016-10-15 NOTE — Patient Instructions (Signed)
Medication Instructions:  START AMLODIPINE 5MG  DAILY  If you need a refill on your cardiac medications before your next appointment, please call your pharmacy.  Labwork: NONE  Follow-Up: 6-8 WEEKS WITH DR [facility]  Thank you for choosing [facility] at [facility]!!    Kerby Moors, LPN

## 2016-10-16 DIAGNOSIS — D631 Anemia in chronic kidney disease: Secondary | ICD-10-CM | POA: Diagnosis not present

## 2016-10-16 DIAGNOSIS — Z283 Underimmunization status: Secondary | ICD-10-CM | POA: Diagnosis not present

## 2016-10-16 DIAGNOSIS — N2581 Secondary hyperparathyroidism of renal origin: Secondary | ICD-10-CM | POA: Diagnosis not present

## 2016-10-16 DIAGNOSIS — E1029 Type 1 diabetes mellitus with other diabetic kidney complication: Secondary | ICD-10-CM | POA: Diagnosis not present

## 2016-10-16 DIAGNOSIS — I5031 Acute diastolic (congestive) heart failure: Secondary | ICD-10-CM | POA: Diagnosis not present

## 2016-10-16 DIAGNOSIS — N186 End stage renal disease: Secondary | ICD-10-CM | POA: Diagnosis not present

## 2016-10-17 ENCOUNTER — Telehealth: Payer: Self-pay | Admitting: *Deleted

## 2016-10-17 NOTE — Telephone Encounter (Signed)
-----   Message from Skeet Latch, MD sent at 10/13/2016  9:05 AM EST ----- Low risk stress test.

## 2016-10-17 NOTE — Telephone Encounter (Signed)
Left message to call back  

## 2016-10-17 NOTE — Telephone Encounter (Signed)
IC patient and advised we can do the gel injection.  Asked him to call us when the knee is hurting again and he wants to do injection.

## 2016-10-18 ENCOUNTER — Ambulatory Visit: Payer: Self-pay | Admitting: Cardiology

## 2016-10-18 DIAGNOSIS — N186 End stage renal disease: Secondary | ICD-10-CM | POA: Diagnosis not present

## 2016-10-18 DIAGNOSIS — Z283 Underimmunization status: Secondary | ICD-10-CM | POA: Diagnosis not present

## 2016-10-18 DIAGNOSIS — N2581 Secondary hyperparathyroidism of renal origin: Secondary | ICD-10-CM | POA: Diagnosis not present

## 2016-10-18 DIAGNOSIS — E1029 Type 1 diabetes mellitus with other diabetic kidney complication: Secondary | ICD-10-CM | POA: Diagnosis not present

## 2016-10-18 DIAGNOSIS — D631 Anemia in chronic kidney disease: Secondary | ICD-10-CM | POA: Diagnosis not present

## 2016-10-18 DIAGNOSIS — I5031 Acute diastolic (congestive) heart failure: Secondary | ICD-10-CM | POA: Diagnosis not present

## 2016-10-18 NOTE — Telephone Encounter (Signed)
Advised patient of results.  

## 2016-10-20 DIAGNOSIS — N2581 Secondary hyperparathyroidism of renal origin: Secondary | ICD-10-CM | POA: Diagnosis not present

## 2016-10-20 DIAGNOSIS — Z283 Underimmunization status: Secondary | ICD-10-CM | POA: Diagnosis not present

## 2016-10-20 DIAGNOSIS — E1029 Type 1 diabetes mellitus with other diabetic kidney complication: Secondary | ICD-10-CM | POA: Diagnosis not present

## 2016-10-20 DIAGNOSIS — N186 End stage renal disease: Secondary | ICD-10-CM | POA: Diagnosis not present

## 2016-10-20 DIAGNOSIS — I5031 Acute diastolic (congestive) heart failure: Secondary | ICD-10-CM | POA: Diagnosis not present

## 2016-10-20 DIAGNOSIS — D631 Anemia in chronic kidney disease: Secondary | ICD-10-CM | POA: Diagnosis not present

## 2016-10-22 DIAGNOSIS — Z992 Dependence on renal dialysis: Secondary | ICD-10-CM | POA: Diagnosis not present

## 2016-10-22 DIAGNOSIS — D631 Anemia in chronic kidney disease: Secondary | ICD-10-CM | POA: Diagnosis not present

## 2016-10-22 DIAGNOSIS — R197 Diarrhea, unspecified: Secondary | ICD-10-CM | POA: Diagnosis not present

## 2016-10-22 DIAGNOSIS — N186 End stage renal disease: Secondary | ICD-10-CM | POA: Diagnosis not present

## 2016-10-22 DIAGNOSIS — Z1211 Encounter for screening for malignant neoplasm of colon: Secondary | ICD-10-CM | POA: Diagnosis not present

## 2016-10-23 DIAGNOSIS — N186 End stage renal disease: Secondary | ICD-10-CM | POA: Diagnosis not present

## 2016-10-23 DIAGNOSIS — D631 Anemia in chronic kidney disease: Secondary | ICD-10-CM | POA: Diagnosis not present

## 2016-10-23 DIAGNOSIS — Z283 Underimmunization status: Secondary | ICD-10-CM | POA: Diagnosis not present

## 2016-10-23 DIAGNOSIS — N2581 Secondary hyperparathyroidism of renal origin: Secondary | ICD-10-CM | POA: Diagnosis not present

## 2016-10-23 DIAGNOSIS — I5031 Acute diastolic (congestive) heart failure: Secondary | ICD-10-CM | POA: Diagnosis not present

## 2016-10-23 DIAGNOSIS — E1029 Type 1 diabetes mellitus with other diabetic kidney complication: Secondary | ICD-10-CM | POA: Diagnosis not present

## 2016-10-24 ENCOUNTER — Ambulatory Visit (INDEPENDENT_AMBULATORY_CARE_PROVIDER_SITE_OTHER): Payer: Medicare Other | Admitting: Podiatry

## 2016-10-24 ENCOUNTER — Encounter: Payer: Self-pay | Admitting: Podiatry

## 2016-10-24 VITALS — BP 91/40 | HR 94 | Resp 18

## 2016-10-24 DIAGNOSIS — L608 Other nail disorders: Secondary | ICD-10-CM

## 2016-10-24 DIAGNOSIS — E1151 Type 2 diabetes mellitus with diabetic peripheral angiopathy without gangrene: Secondary | ICD-10-CM

## 2016-10-24 NOTE — Progress Notes (Signed)
Patient ID: [name], male   DOB: 28-Jul-1961, 56 y.o.   MRN: [id number]    Subjective: This patient presents complaining of elongated toenails when walking and wearing shoes and request toenail debridement  Objective:  patient appears pleasant orientated 3  Vascular: No peripheral edema noted bilaterally DP and PT pulses 0/4 bilaterally Capillary reflex within normal limits bilaterally  Neurological: Sensation to 10 g monofilament wire intact 4/5 bilaterally Vibratory sensation nonreactive right reactive left Ankle reflex were equal and reactive bilaterally  Dermatological: No open skin lesions bilaterally Dry atrophic skin bilaterally without hair growth The toenails are elongated, incurvated with occasional texture and color changes  Musculoskeletal: Amputation distal left hallux with well-healed incision Rigid hammertoe second left without any signs of irritation on the second left toe There is no restriction ankle, subtalar, midtarsal joints  Assessment: Incurvated toenails 6-10 Decrease pedal pulses suggestive of possible peripheral arterial disease Diabetic peripheral neuropathy Hammertoe second left  Plan: Debridement toenails 6-10 mechanically and electrically without any bleeding  Reappoint 3 months

## 2016-10-24 NOTE — Patient Instructions (Signed)

## 2016-10-25 DIAGNOSIS — Z283 Underimmunization status: Secondary | ICD-10-CM | POA: Diagnosis not present

## 2016-10-25 DIAGNOSIS — N186 End stage renal disease: Secondary | ICD-10-CM | POA: Diagnosis not present

## 2016-10-25 DIAGNOSIS — I5031 Acute diastolic (congestive) heart failure: Secondary | ICD-10-CM | POA: Diagnosis not present

## 2016-10-25 DIAGNOSIS — D631 Anemia in chronic kidney disease: Secondary | ICD-10-CM | POA: Diagnosis not present

## 2016-10-25 DIAGNOSIS — N2581 Secondary hyperparathyroidism of renal origin: Secondary | ICD-10-CM | POA: Diagnosis not present

## 2016-10-25 DIAGNOSIS — E1029 Type 1 diabetes mellitus with other diabetic kidney complication: Secondary | ICD-10-CM | POA: Diagnosis not present

## 2016-10-26 NOTE — Progress Notes (Signed)
Dr. Bridgett Larsson aware that many unsuccessful attempts have been made to contact pt; unable to leave voice message with pre-op instructions. Dr. Ermalene Postin reviewed pt latest cardiac note from [facility], PA-C; pt " okay, " no new orders given.

## 2016-10-27 DIAGNOSIS — N186 End stage renal disease: Secondary | ICD-10-CM | POA: Diagnosis not present

## 2016-10-27 DIAGNOSIS — N2581 Secondary hyperparathyroidism of renal origin: Secondary | ICD-10-CM | POA: Diagnosis not present

## 2016-10-27 DIAGNOSIS — D631 Anemia in chronic kidney disease: Secondary | ICD-10-CM | POA: Diagnosis not present

## 2016-10-27 DIAGNOSIS — E1029 Type 1 diabetes mellitus with other diabetic kidney complication: Secondary | ICD-10-CM | POA: Diagnosis not present

## 2016-10-27 DIAGNOSIS — I5031 Acute diastolic (congestive) heart failure: Secondary | ICD-10-CM | POA: Diagnosis not present

## 2016-10-27 DIAGNOSIS — Z283 Underimmunization status: Secondary | ICD-10-CM | POA: Diagnosis not present

## 2016-10-28 NOTE — Anesthesia Preprocedure Evaluation (Addendum)
Anesthesia Evaluation  Patient identified by MRN, date of birth, ID band Patient awake    Reviewed: Allergy & Precautions, NPO status , Patient's Chart, lab work & pertinent test results  Airway Mallampati: II  TM Distance: >3 FB Neck ROM: Full    Dental   Pulmonary neg pulmonary ROS,    breath sounds clear to auscultation       Cardiovascular hypertension, Pt. on medications + Peripheral Vascular Disease   Rhythm:Regular Rate:Normal     Neuro/Psych negative neurological ROS     GI/Hepatic Neg liver ROS, GERD  ,  Endo/Other  diabetes, Type 2  Renal/GU ESRFRenal disease     Musculoskeletal   Abdominal   Peds  Hematology  (+) anemia ,   Anesthesia Other Findings   Reproductive/Obstetrics                            Anesthesia Physical Anesthesia Plan  ASA: III  Anesthesia Plan: MAC   Post-op Pain Management:    Induction: Intravenous  Airway Management Planned: Natural Airway and Simple Face Mask  Additional Equipment:   Intra-op Plan:   Post-operative Plan:   Informed Consent: I have reviewed the patients History and Physical, chart, labs and discussed the procedure including the risks, benefits and alternatives for the proposed anesthesia with the patient or authorized representative who has indicated his/her understanding and acceptance.     Plan Discussed with:   Anesthesia Plan Comments:        Anesthesia Quick Evaluation

## 2016-10-29 ENCOUNTER — Ambulatory Visit (HOSPITAL_COMMUNITY): Payer: Medicare Other | Admitting: Anesthesiology

## 2016-10-29 ENCOUNTER — Telehealth: Payer: Self-pay | Admitting: Vascular Surgery

## 2016-10-29 ENCOUNTER — Encounter (HOSPITAL_COMMUNITY)
Admission: RE | Disposition: A | Discharge status: Home / Self Care | Payer: Self-pay | Source: Ambulatory Visit | Attending: Vascular Surgery

## 2016-10-29 ENCOUNTER — Encounter (HOSPITAL_COMMUNITY): Payer: Self-pay | Admitting: Anesthesiology

## 2016-10-29 ENCOUNTER — Inpatient Hospital Stay (HOSPITAL_COMMUNITY)
Admission: RE | Admit: 2016-10-29 | Discharge: 2016-10-30 | DRG: 252 | Disposition: A | Discharge status: Home / Self Care | Payer: Medicare Other | Source: Ambulatory Visit | Attending: Vascular Surgery | Admitting: Vascular Surgery

## 2016-10-29 DIAGNOSIS — D631 Anemia in chronic kidney disease: Secondary | ICD-10-CM | POA: Diagnosis not present

## 2016-10-29 DIAGNOSIS — I5032 Chronic diastolic (congestive) heart failure: Secondary | ICD-10-CM | POA: Diagnosis present

## 2016-10-29 DIAGNOSIS — I132 Hypertensive heart and chronic kidney disease with heart failure and with stage 5 chronic kidney disease, or end stage renal disease: Secondary | ICD-10-CM | POA: Diagnosis present

## 2016-10-29 DIAGNOSIS — Z283 Underimmunization status: Secondary | ICD-10-CM | POA: Diagnosis not present

## 2016-10-29 DIAGNOSIS — T82898A Other specified complication of vascular prosthetic devices, implants and grafts, initial encounter: Secondary | ICD-10-CM | POA: Diagnosis not present

## 2016-10-29 DIAGNOSIS — I120 Hypertensive chronic kidney disease with stage 5 chronic kidney disease or end stage renal disease: Secondary | ICD-10-CM | POA: Diagnosis not present

## 2016-10-29 DIAGNOSIS — K219 Gastro-esophageal reflux disease without esophagitis: Secondary | ICD-10-CM | POA: Diagnosis present

## 2016-10-29 DIAGNOSIS — N186 End stage renal disease: Secondary | ICD-10-CM

## 2016-10-29 DIAGNOSIS — I871 Compression of vein: Principal | ICD-10-CM | POA: Diagnosis present

## 2016-10-29 DIAGNOSIS — E1151 Type 2 diabetes mellitus with diabetic peripheral angiopathy without gangrene: Secondary | ICD-10-CM | POA: Diagnosis not present

## 2016-10-29 DIAGNOSIS — I5031 Acute diastolic (congestive) heart failure: Secondary | ICD-10-CM | POA: Diagnosis not present

## 2016-10-29 DIAGNOSIS — Z992 Dependence on renal dialysis: Secondary | ICD-10-CM | POA: Diagnosis not present

## 2016-10-29 DIAGNOSIS — E1122 Type 2 diabetes mellitus with diabetic chronic kidney disease: Secondary | ICD-10-CM | POA: Diagnosis present

## 2016-10-29 DIAGNOSIS — N2581 Secondary hyperparathyroidism of renal origin: Secondary | ICD-10-CM | POA: Diagnosis not present

## 2016-10-29 DIAGNOSIS — E1029 Type 1 diabetes mellitus with other diabetic kidney complication: Secondary | ICD-10-CM | POA: Diagnosis not present

## 2016-10-29 HISTORY — DX: Pneumonia, unspecified organism: J18.9

## 2016-10-29 HISTORY — PX: REVISON OF ARTERIOVENOUS FISTULA: SHX6074

## 2016-10-29 HISTORY — DX: Type 2 diabetes mellitus without complications: E11.9

## 2016-10-29 LAB — CBC
HEMATOCRIT: 33.3 % — AB (ref 39.0–52.0)
HEMOGLOBIN: 11 g/dL — AB (ref 13.0–17.0)
MCH: 33.1 pg (ref 26.0–34.0)
MCHC: 33 g/dL (ref 30.0–36.0)
MCV: 100.3 fL — ABNORMAL HIGH (ref 78.0–100.0)
Platelets: 237 10*3/uL (ref 150–400)
RBC: 3.32 MIL/uL — AB (ref 4.22–5.81)
RDW: 15.5 % (ref 11.5–15.5)
WBC: 8.8 10*3/uL (ref 4.0–10.5)

## 2016-10-29 LAB — POCT I-STAT 4, (NA,K, GLUC, HGB,HCT)
Glucose, Bld: 134 mg/dL — ABNORMAL HIGH (ref 65–99)
HCT: 32 % — ABNORMAL LOW (ref 39.0–52.0)
Hemoglobin: 10.9 g/dL — ABNORMAL LOW (ref 13.0–17.0)
Potassium: 3.7 mmol/L (ref 3.5–5.1)
Sodium: 137 mmol/L (ref 135–145)

## 2016-10-29 LAB — CREATININE, SERUM
CREATININE: 7.36 mg/dL — AB (ref 0.61–1.24)
GFR, EST AFRICAN AMERICAN: 9 mL/min — AB (ref >60)
GFR, EST NON AFRICAN AMERICAN: 7 mL/min — AB (ref >60)

## 2016-10-29 LAB — GLUCOSE, CAPILLARY
GLUCOSE-CAPILLARY: 117 mg/dL — AB (ref 65–99)
GLUCOSE-CAPILLARY: 126 mg/dL — AB (ref 65–99)

## 2016-10-29 SURGERY — REVISON OF ARTERIOVENOUS FISTULA
Anesthesia: Monitor Anesthesia Care | Site: Arm Upper | Laterality: Right

## 2016-10-29 MED ORDER — POTASSIUM CHLORIDE CRYS ER 20 MEQ PO TBCR
20.0000 meq | EXTENDED_RELEASE_TABLET | Freq: Once | ORAL | Status: AC
  Administered 2016-10-29: 20 meq via ORAL
  Filled 2016-10-29: qty 1

## 2016-10-29 MED ORDER — ACETAMINOPHEN 325 MG PO TABS: 325.0000 mg | ORAL_TABLET | ORAL | Status: DC | PRN

## 2016-10-29 MED ORDER — OXYCODONE-ACETAMINOPHEN 5-325 MG PO TABS
1.0000 | ORAL_TABLET | ORAL | Status: DC | PRN
  Administered 2016-10-29 – 2016-10-30 (×2): 2 via ORAL
  Filled 2016-10-29 (×2): qty 2

## 2016-10-29 MED ORDER — PROMETHAZINE HCL 25 MG/ML IJ SOLN: 6.2500 mg | INTRAMUSCULAR | Status: DC | PRN

## 2016-10-29 MED ORDER — 0.9 % SODIUM CHLORIDE (POUR BTL) OPTIME
TOPICAL | Status: DC | PRN
  Administered 2016-10-29: 1000 mL

## 2016-10-29 MED ORDER — PROPOFOL 500 MG/50ML IV EMUL
INTRAVENOUS | Status: DC | PRN
  Administered 2016-10-29: 25 ug/kg/min via INTRAVENOUS

## 2016-10-29 MED ORDER — LIDOCAINE HCL (PF) 1 % IJ SOLN
INTRAMUSCULAR | Status: DC | PRN
  Administered 2016-10-29 (×2): 30 mL

## 2016-10-29 MED ORDER — FENTANYL CITRATE (PF) 100 MCG/2ML IJ SOLN
25.0000 ug | INTRAMUSCULAR | Status: DC | PRN
  Administered 2016-10-29: 25 ug via INTRAVENOUS

## 2016-10-29 MED ORDER — AMLODIPINE BESYLATE 5 MG PO TABS
5.0000 mg | ORAL_TABLET | Freq: Every day | ORAL | Status: DC
  Administered 2016-10-29: 5 mg via ORAL
  Filled 2016-10-29 (×2): qty 1

## 2016-10-29 MED ORDER — SODIUM CHLORIDE 0.9 % IV SOLN
INTRAVENOUS | Status: DC | PRN
  Administered 2016-10-29: 08:00:00

## 2016-10-29 MED ORDER — SODIUM CHLORIDE 0.9 % IV SOLN
INTRAVENOUS | Status: DC | PRN
  Administered 2016-10-29: 07:00:00 via INTRAVENOUS

## 2016-10-29 MED ORDER — LIDOCAINE HCL (PF) 1 % IJ SOLN
INTRAMUSCULAR | Status: AC
  Filled 2016-10-29: qty 30

## 2016-10-29 MED ORDER — FENTANYL CITRATE (PF) 100 MCG/2ML IJ SOLN
INTRAMUSCULAR | Status: AC
  Filled 2016-10-29: qty 4

## 2016-10-29 MED ORDER — MIDAZOLAM HCL 2 MG/2ML IJ SOLN
INTRAMUSCULAR | Status: AC
  Filled 2016-10-29: qty 2

## 2016-10-29 MED ORDER — MORPHINE SULFATE (PF) 2 MG/ML IV SOLN
2.0000 mg | INTRAVENOUS | Status: DC | PRN
  Administered 2016-10-29: 4 mg via INTRAVENOUS
  Filled 2016-10-29: qty 2

## 2016-10-29 MED ORDER — CINACALCET HCL 30 MG PO TABS
30.0000 mg | ORAL_TABLET | Freq: Every day | ORAL | Status: DC
  Administered 2016-10-29: 30 mg via ORAL
  Filled 2016-10-29 (×2): qty 1

## 2016-10-29 MED ORDER — SODIUM CHLORIDE 0.9% FLUSH: 3.0000 mL | INTRAVENOUS | Status: DC | PRN

## 2016-10-29 MED ORDER — LOSARTAN POTASSIUM 25 MG PO TABS
25.0000 mg | ORAL_TABLET | Freq: Every day | ORAL | Status: DC
  Administered 2016-10-29: 25 mg via ORAL
  Filled 2016-10-29 (×2): qty 1

## 2016-10-29 MED ORDER — MIDAZOLAM HCL 5 MG/5ML IJ SOLN
INTRAMUSCULAR | Status: DC | PRN
  Administered 2016-10-29 (×2): 1 mg via INTRAVENOUS

## 2016-10-29 MED ORDER — PANTOPRAZOLE SODIUM 40 MG PO TBEC
40.0000 mg | DELAYED_RELEASE_TABLET | Freq: Every day | ORAL | Status: DC
  Administered 2016-10-29: 40 mg via ORAL
  Filled 2016-10-29: qty 1

## 2016-10-29 MED ORDER — OXYCODONE-ACETAMINOPHEN 5-325 MG PO TABS: 1.0000 | ORAL_TABLET | ORAL | 0 refills | Status: DC | PRN

## 2016-10-29 MED ORDER — PHENOL 1.4 % MT LIQD: 1.0000 | OROMUCOSAL | Status: DC | PRN

## 2016-10-29 MED ORDER — ALUM & MAG HYDROXIDE-SIMETH 200-200-20 MG/5ML PO SUSP: 15.0000 mL | ORAL | Status: DC | PRN

## 2016-10-29 MED ORDER — DEXTROSE 5 % IV SOLN
1.5000 g | INTRAVENOUS | Status: AC
  Administered 2016-10-29: 1.5 g via INTRAVENOUS
  Filled 2016-10-29: qty 1.5

## 2016-10-29 MED ORDER — PROPOFOL 10 MG/ML IV BOLUS
INTRAVENOUS | Status: DC | PRN
  Administered 2016-10-29 (×3): 20 mg via INTRAVENOUS

## 2016-10-29 MED ORDER — ONDANSETRON HCL 4 MG/2ML IJ SOLN
4.0000 mg | Freq: Four times a day (QID) | INTRAMUSCULAR | Status: DC | PRN
  Administered 2016-10-30: 4 mg via INTRAVENOUS
  Filled 2016-10-29: qty 2

## 2016-10-29 MED ORDER — ENOXAPARIN SODIUM 30 MG/0.3ML ~~LOC~~ SOLN
30.0000 mg | SUBCUTANEOUS | Status: DC
Start: 2016-10-30 — End: 2016-10-30

## 2016-10-29 MED ORDER — FENTANYL CITRATE (PF) 100 MCG/2ML IJ SOLN
INTRAMUSCULAR | Status: DC | PRN
  Administered 2016-10-29 (×4): 50 ug via INTRAVENOUS

## 2016-10-29 MED ORDER — GUAIFENESIN-DM 100-10 MG/5ML PO SYRP: 15.0000 mL | ORAL_SOLUTION | ORAL | Status: DC | PRN

## 2016-10-29 MED ORDER — FENTANYL CITRATE (PF) 100 MCG/2ML IJ SOLN
INTRAMUSCULAR | Status: AC
  Filled 2016-10-29: qty 2

## 2016-10-29 MED ORDER — HYDRALAZINE HCL 20 MG/ML IJ SOLN
5.0000 mg | INTRAMUSCULAR | Status: DC | PRN
  Filled 2016-10-29: qty 0.25

## 2016-10-29 MED ORDER — SODIUM CHLORIDE 0.9 % IV SOLN: 250.0000 mL | INTRAVENOUS | Status: DC | PRN

## 2016-10-29 MED ORDER — LABETALOL HCL 5 MG/ML IV SOLN
10.0000 mg | INTRAVENOUS | Status: DC | PRN
  Filled 2016-10-29: qty 4

## 2016-10-29 MED ORDER — PROPOFOL 10 MG/ML IV BOLUS
INTRAVENOUS | Status: AC
  Filled 2016-10-29: qty 40

## 2016-10-29 MED ORDER — SODIUM CHLORIDE 0.9% FLUSH
3.0000 mL | Freq: Two times a day (BID) | INTRAVENOUS | Status: DC
  Administered 2016-10-29: 3 mL via INTRAVENOUS

## 2016-10-29 MED ORDER — METOPROLOL TARTRATE 5 MG/5ML IV SOLN
2.0000 mg | INTRAVENOUS | Status: DC | PRN
  Filled 2016-10-29: qty 5

## 2016-10-29 MED ORDER — ACETAMINOPHEN 325 MG RE SUPP: 325.0000 mg | RECTAL | Status: DC | PRN

## 2016-10-29 SURGICAL SUPPLY — 39 items
ARMBAND PINK RESTRICT EXTREMIT (MISCELLANEOUS) ×3 IMPLANT
CANISTER SUCT 3000ML PPV (MISCELLANEOUS) ×3 IMPLANT
CLIP TI MEDIUM 6 (CLIP) ×3 IMPLANT
CLIP TI WIDE RED SMALL 6 (CLIP) ×3 IMPLANT
COVER PROBE W GEL 5X96 (DRAPES) ×3 IMPLANT
DECANTER SPIKE VIAL GLASS SM (MISCELLANEOUS) ×3 IMPLANT
DERMABOND ADVANCED (GAUZE/BANDAGES/DRESSINGS) ×2
DERMABOND ADVANCED .7 DNX12 (GAUZE/BANDAGES/DRESSINGS) ×1 IMPLANT
ELECT REM PT RETURN 9FT ADLT (ELECTROSURGICAL) ×3
ELECTRODE REM PT RTRN 9FT ADLT (ELECTROSURGICAL) ×1 IMPLANT
GLOVE BIO SURGEON STRL SZ 6.5 (GLOVE) ×4 IMPLANT
GLOVE BIO SURGEON STRL SZ7 (GLOVE) ×3 IMPLANT
GLOVE BIO SURGEONS STRL SZ 6.5 (GLOVE) ×2
GLOVE BIOGEL PI IND STRL 6.5 (GLOVE) ×2 IMPLANT
GLOVE BIOGEL PI IND STRL 7.0 (GLOVE) ×2 IMPLANT
GLOVE BIOGEL PI IND STRL 7.5 (GLOVE) ×2 IMPLANT
GLOVE BIOGEL PI IND STRL 8 (GLOVE) ×1 IMPLANT
GLOVE BIOGEL PI INDICATOR 6.5 (GLOVE) ×4
GLOVE BIOGEL PI INDICATOR 7.0 (GLOVE) ×4
GLOVE BIOGEL PI INDICATOR 7.5 (GLOVE) ×4
GLOVE BIOGEL PI INDICATOR 8 (GLOVE) ×2
GLOVE ECLIPSE 6.5 STRL STRAW (GLOVE) ×3 IMPLANT
GLOVE ECLIPSE 7.0 STRL STRAW (GLOVE) ×3 IMPLANT
GOWN STRL REUS W/ TWL LRG LVL3 (GOWN DISPOSABLE) ×5 IMPLANT
GOWN STRL REUS W/TWL LRG LVL3 (GOWN DISPOSABLE) ×10
HEMOSTAT SPONGE AVITENE ULTRA (HEMOSTASIS) IMPLANT
KIT BASIN OR (CUSTOM PROCEDURE TRAY) ×3 IMPLANT
KIT ROOM TURNOVER OR (KITS) ×3 IMPLANT
NS IRRIG 1000ML POUR BTL (IV SOLUTION) ×3 IMPLANT
PACK CV ACCESS (CUSTOM PROCEDURE TRAY) ×3 IMPLANT
PAD ARMBOARD 7.5X6 YLW CONV (MISCELLANEOUS) ×6 IMPLANT
SUT MNCRL AB 4-0 PS2 18 (SUTURE) ×6 IMPLANT
SUT PROLENE 6 0 BV (SUTURE) ×3 IMPLANT
SUT PROLENE 7 0 BV 1 (SUTURE) IMPLANT
SUT SILK 2 0 SH (SUTURE) ×3 IMPLANT
SUT VIC AB 3-0 SH 27 (SUTURE) ×4
SUT VIC AB 3-0 SH 27X BRD (SUTURE) ×2 IMPLANT
UNDERPAD 30X30 (UNDERPADS AND DIAPERS) ×3 IMPLANT
WATER STERILE IRR 1000ML POUR (IV SOLUTION) ×3 IMPLANT

## 2016-10-29 NOTE — Op Note (Addendum)
    OPERATIVE NOTE   PROCEDURE: 1. Right cephalic vein turndown (revision of arteriovenous fistula)  PRE-OPERATIVE DIAGNOSIS: central cephalic vein stenosis  POST-OPERATIVE DIAGNOSIS: same as above   SURGEON: Adele Barthel, MD  ASSISTANT(S): Gerri Lins, PAC   ANESTHESIA: local and MAC  ESTIMATED BLOOD LOSS: 100 cc  FINDING(S): 1.  Improved thrill at end of case  SPECIMEN(S):  none  INDICATIONS:   [name] is a 56 y.o. male who presents with recurrent central cephalic venous stenosis  despite prior venoplasty.  I recommended: Right cephalic vein turndown to address the stenosis.  Risk, benefits, and alternatives to access surgery were discussed.    The patient is aware the risks include but are not limited to: bleeding, infection, steal syndrome, nerve damage, ischemic monomelic neuropathy, thrombosis, failure to mature, need for additional procedures, death and stroke.  The patient agrees to proceed forward with the procedure.   DESCRIPTION: After obtaining full informed written consent, the patient was brought back to the operating room and placed supine upon the operating table.  The patient received IV antibiotics prior to induction.  After obtaining adequate anesthesia, the patient was prepped and draped in the standard fashion for: right access procedure.    Under Sonosite guidance, I marked the proximal cephalic vein and then target vein the axilla: basilic vein.  I injected 40 cc 1% lidocaine without epinephrine over the proximal cephalic vein and over the axilla.  I made an incision over the proximal cephalic vein and dissected out the vein with electrocautery and blunt dissection.  I made an incision in the axilla over the basilic vein and dissected out this vein.  I verified that I had adequate length for the transposition.  I bluntly dissected a subcutaneous tunnel from the cephalic vein exposure to the axillary exposure.  I then ligated the fistula  proximally and clamped the cephalic distally.  The vein was passed through the subcutaneous tunnel taking care to maintain the previously marked orientation.    The cephalic vein was sewed to the vasilic vein in an end-to-end configuration with a running stitch of 6-0 Prolene.  Prior to completing this anastomosis, I allowed both ends to bleed: There was some thrombus present, so backbled both ends until there was no further thrombus.  I completed this anastomosis in the usual fashion.  After releasing all vessel loops and clamps, immediately there was a palpable thrill.  Each incision was then washed out and bleeding controlled with electrocautery.  After applying pressure for a few minutes, no further bleeding was present. Each incision was repaired with a double layer of 3-0 Vicryl and a running subcuticular of 4-0 Monocryl in the skin.  The skin was cleaned, dried, and reinforced with Dermabond.   COMPLICATIONS: none  CONDITION: stable  Adele Barthel, MD, [facility] [place] Office: [phone] Pager: [phone]  10/29/2016, 9:53 AM

## 2016-10-29 NOTE — Telephone Encounter (Signed)
-----   Message from Mena Goes, RN sent at 10/29/2016 10:16 AM EST ----- Regarding: schedule 4-5 weeks   ----- Message ----- From: Ulyses Amor, PA-C Sent: 10/29/2016   9:53 AM To: Vvs Charge Pool  F/U with Dr. Bridgett Larsson s/p revision AV fistula 4-5 weeks

## 2016-10-29 NOTE — Anesthesia Postprocedure Evaluation (Signed)
Anesthesia Post Note  Patient: [name]  Procedure(s) Performed: Procedure(s) (LRB): CEPHALIC VEIN TURNDOWN RIGHT UPPER ARM (Right)  Patient location during evaluation: PACU Anesthesia Type: MAC Level of consciousness: awake and alert Pain management: pain level controlled Vital Signs Assessment: post-procedure vital signs reviewed and stable Respiratory status: spontaneous breathing, nonlabored ventilation, respiratory function stable and patient connected to nasal cannula oxygen Cardiovascular status: stable and blood pressure returned to baseline Anesthetic complications: no       Last Vitals:  Vitals:   10/29/16 1025 10/29/16 1040  BP: 129/85 135/89  Pulse: 74 73  Resp: 13 15  Temp:      Last Pain:  Vitals:   10/29/16 1040  TempSrc:   PainSc: 8                  Tiajuana Amass

## 2016-10-29 NOTE — Telephone Encounter (Signed)
Sched apt 12/14/16 at 10:30. Cell# not receiving calls, hm# not working, lm on emergency contact's # to inform them of appt.

## 2016-10-29 NOTE — Progress Notes (Signed)
Patient arrived to 2W room 11.  Telemetry monitor applied and CCMD notified.  Patient oriented to unit and room to include call light and phone.  Will continue to monitor. 

## 2016-10-29 NOTE — H&P (Signed)
Brief History and Physical  History of Present Illness  [name] is a 56 y.o. male who presents with chief complaint: routine scheduled procedure.  The patient presents today for R cephalic vein turndown.    Past Medical History:  Diagnosis Date  . Anemia   . Cellulitis and abscess of foot 08/24/2011  . Cocaine abuse 2016  . Diabetes mellitus 2015   type 2  . Diastolic congestive heart failure ([place])   . ESRD on dialysis ([place])   . GERD (gastroesophageal reflux disease)    unsure  . HCVD (hypertensive cardiovascular disease) 09/2016   Echo shows moderate to severe LAE, grade 1 DD  . Hypertension 2015  . Protein calorie malnutrition ([place])   . PVD (peripheral vascular disease) ([place])     Past Surgical History:  Procedure Laterality Date  . AMPUTATION  09/06/2011   Procedure: AMPUTATION RAY;  Surgeon: Wylene Simmer, MD;  Location: [place];  Service: Orthopedics;  Laterality: Left;  Left Hallux Amputation  . APPENDECTOMY    . AV FISTULA PLACEMENT Left 08/12/2014   Procedure: CREATION OF A BRACHIOCEPHALIC ARTERIOVENOUS (AV) FISTULA  LEFT ARM;  Surgeon: Mal Misty, MD;  Location: [place];  Service: Vascular;  Laterality: Left;  . AV FISTULA PLACEMENT Right 10/07/2014   Procedure: Creation Right Arm Arteriovenous fistula;  Surgeon: Mal Misty, MD;  Location: [place];  Service: Vascular;  Laterality: Right;  . KNEE SURGERY Right    "metal plate and screws" per patient   . LEG SURGERY      Social History   Social History  . Marital status: Legally Separated    Spouse name: N/A  . Number of children: N/A  . Years of education: N/A   Occupational History  . Not on file.   Social History Main Topics  . Smoking status: Never Smoker  . Smokeless tobacco: Never Used  . Alcohol use No     Comment: once in a while per patient "beer/brandy"  . Drug use: No     Comment: per pt* smoked marijuana as a teenager and last cocaine use was two months ago  . Sexual  activity: No   Other Topics Concern  . Not on file   Social History Narrative  . No narrative on file    Family History  Problem Relation Age of Onset  . Adopted: Yes  . Varicose Veins Sister     No current facility-administered medications on file prior to encounter.    Current Outpatient Prescriptions on File Prior to Encounter  Medication Sig Dispense Refill  . cinacalcet (SENSIPAR) 30 MG tablet Take 30 mg by mouth at bedtime. Reported on 12/21/2015    . lanthanum (FOSRENOL) 1000 MG chewable tablet Chew 1,000 mg by mouth 3 (three) times daily with meals. Reported on 12/21/2015    . losartan (COZAAR) 25 MG tablet Take 25 mg by mouth daily. Reported on 12/21/2015      Allergies  Allergen Reactions  . No Known Allergies     Review of Systems: As listed above, otherwise negative.  Physical Examination  Vitals:   10/29/16 0609  BP: (!) 164/85  Pulse: 88  Resp: 18  Temp: 98.7 F (37.1 C)  TempSrc: Oral  SpO2: 98%    General: A&O x 3, WDWN  Pulmonary: Sym exp, good air movt, CTAB, no rales, rhonchi, & wheezing  Cardiac: RRR, Nl S1, S2, no Murmurs, rubs or gallops  Gastrointestinal: soft, NTND, -G/R, -  HSM, - masses, - CVAT B  Musculoskeletal: M/S 5/5 throughout , Extremities without ischemic changes , RU fistula with palpable thrill with pulsatile segment, small to moderate aneurysms in mid-segment  Laboratory See [place] is a 56 y.o. male who presents with: central cephalic vein stenosis, ESRD-HD.   The patient is scheduled for: R cephalic vein turndown  Risk, benefits, and alternatives to access surgery were discussed.  The patient is aware the risks include but are not limited to: bleeding, infection, steal syndrome, nerve damage, ischemic monomelic neuropathy, failure to mature, and need for additional procedures.  The patient is aware of the risks and agrees to proceed.  Adele Barthel, MD [facility] [place] Office: [phone] Pager: [phone]  10/29/2016, 7:08 AM

## 2016-10-29 NOTE — Transfer of Care (Signed)
Immediate Anesthesia Transfer of Care Note  Patient: [name]  Procedure(s) Performed: Procedure(s): CEPHALIC VEIN TURNDOWN RIGHT UPPER ARM (Right)  Patient Location: PACU  Anesthesia Type:MAC  Level of Consciousness: awake, alert , oriented and sedated  Airway & Oxygen Therapy: Patient Spontanous Breathing and Patient connected to nasal cannula oxygen  Post-op Assessment: Report given to RN, Post -op Vital signs reviewed and stable and Patient moving all extremities  Post vital signs: Reviewed and stable  Last Vitals:  Vitals:   10/29/16 0609  BP: (!) 164/85  Pulse: 88  Resp: 18  Temp: 37.1 C    Last Pain:  Vitals:   10/29/16 0609  TempSrc: Oral      Patients Stated Pain Goal: 3 (45/40/98 1191)  Complications: No apparent anesthesia complications

## 2016-10-30 ENCOUNTER — Encounter (HOSPITAL_COMMUNITY): Payer: Self-pay | Admitting: Vascular Surgery

## 2016-10-30 DIAGNOSIS — I132 Hypertensive heart and chronic kidney disease with heart failure and with stage 5 chronic kidney disease, or end stage renal disease: Secondary | ICD-10-CM | POA: Diagnosis not present

## 2016-10-30 DIAGNOSIS — E1122 Type 2 diabetes mellitus with diabetic chronic kidney disease: Secondary | ICD-10-CM | POA: Diagnosis not present

## 2016-10-30 DIAGNOSIS — E1029 Type 1 diabetes mellitus with other diabetic kidney complication: Secondary | ICD-10-CM | POA: Diagnosis not present

## 2016-10-30 DIAGNOSIS — I5031 Acute diastolic (congestive) heart failure: Secondary | ICD-10-CM | POA: Diagnosis not present

## 2016-10-30 DIAGNOSIS — N186 End stage renal disease: Secondary | ICD-10-CM | POA: Diagnosis not present

## 2016-10-30 DIAGNOSIS — I871 Compression of vein: Secondary | ICD-10-CM | POA: Diagnosis not present

## 2016-10-30 DIAGNOSIS — I5032 Chronic diastolic (congestive) heart failure: Secondary | ICD-10-CM | POA: Diagnosis not present

## 2016-10-30 DIAGNOSIS — Z283 Underimmunization status: Secondary | ICD-10-CM | POA: Diagnosis not present

## 2016-10-30 DIAGNOSIS — I120 Hypertensive chronic kidney disease with stage 5 chronic kidney disease or end stage renal disease: Secondary | ICD-10-CM | POA: Diagnosis not present

## 2016-10-30 DIAGNOSIS — D631 Anemia in chronic kidney disease: Secondary | ICD-10-CM | POA: Diagnosis not present

## 2016-10-30 DIAGNOSIS — N2581 Secondary hyperparathyroidism of renal origin: Secondary | ICD-10-CM | POA: Diagnosis not present

## 2016-10-30 DIAGNOSIS — E1151 Type 2 diabetes mellitus with diabetic peripheral angiopathy without gangrene: Secondary | ICD-10-CM | POA: Diagnosis not present

## 2016-10-30 LAB — MRSA PCR SCREENING: MRSA by PCR: NEGATIVE

## 2016-10-30 MED ORDER — OXYCODONE-ACETAMINOPHEN 5-325 MG PO TABS: 1.0000 | ORAL_TABLET | ORAL | 0 refills | Status: DC | PRN

## 2016-10-30 NOTE — Progress Notes (Signed)
10/30/2016 9:07 AM Discharge AVS meds taken today and those due this evening reviewed.  Follow-up appointments and when to call md reviewed.  D/C IV and TELE.  Questions and concerns addressed.   D/C home per orders. [place]

## 2016-10-30 NOTE — Progress Notes (Addendum)
  [facility] Progress Note  Subjective  - POD #1  No complaints.   Objective Vitals:   10/29/16 1950 10/30/16 0607  BP: (!) 149/77 (!) 159/84  Pulse: 86 90  Resp: 18 18  Temp: 98.2 F (36.8 C) 97.6 F (36.4 C)    Intake/Output Summary (Last 24 hours) at 10/30/16 0750 Last data filed at 10/29/16 1805  Gross per 24 hour  Intake             1060 ml  Output               75 ml  Net              985 ml    Right arm fistula with palpable thrill. Incision clean and intact.  Right hand well perfused.   Assessment/Planning: 56 y.o. male is s/p: left cephalic vein turndown 1 Day Post-Op   D/c home today in time for outpatient dialysis at 11.   Alvia [place] 10/30/2016 7:50 AM --  Laboratory CBC    Component Value Date/Time   WBC 8.8 10/29/2016 1214   HGB 11.0 (L) 10/29/2016 1214   HCT 33.3 (L) 10/29/2016 1214   HCT 22.2 (L) 01/27/2015 0300   PLT 237 10/29/2016 1214    BMET    Component Value Date/Time   NA 137 10/29/2016 0641   K 3.7 10/29/2016 0641   CL 99 (L) 10/02/2016 1606   CO2 21 (L) 10/02/2016 1606   GLUCOSE 134 (H) 10/29/2016 0641   BUN 68 (H) 10/02/2016 1606   CREATININE 7.36 (H) 10/29/2016 1214   CREATININE 5.40 (H) 02/08/2015 1025   CALCIUM 8.1 (L) 10/02/2016 1606   GFRNONAA 7 (L) 10/29/2016 1214   GFRNONAA 11 (L) 02/08/2015 1025   GFRAA 9 (L) 10/29/2016 1214   GFRAA 13 (L) 02/08/2015 1025    COAG Lab Results  Component Value Date   INR 1.23 02/01/2015   No results found for: PTT  Antibiotics Anti-infectives    Start     Dose/Rate Route Frequency Ordered Stop   10/29/16 0626  cefUROXime (ZINACEF) 1.5 g in dextrose 5 % 50 mL IVPB     1.5 g 100 mL/hr over 30 Minutes Intravenous 30 min pre-op 10/29/16 0626 10/29/16 [place], PA-C [facility] Office: [phone] Pager: [phone] 10/30/2016 7:50 AM  Addendum  I have independently interviewed and examined the patient,  and I agree with the physician assistant's findings.  No bleeding, inc c/d/i x 2.  Strong thrill in fistula now.  Adele Barthel, MD, FACS [facility] [place] Office: [phone] Pager: [phone]  10/30/2016, 7:54 AM

## 2016-10-31 DIAGNOSIS — N186 End stage renal disease: Secondary | ICD-10-CM | POA: Diagnosis not present

## 2016-10-31 DIAGNOSIS — E1122 Type 2 diabetes mellitus with diabetic chronic kidney disease: Secondary | ICD-10-CM | POA: Diagnosis not present

## 2016-10-31 DIAGNOSIS — Z992 Dependence on renal dialysis: Secondary | ICD-10-CM | POA: Diagnosis not present

## 2016-10-31 NOTE — Discharge Summary (Signed)
[facility] Discharge Summary  [name] [id number] 56 y.o. male  [id number]  Admission Date: 10/29/2016  Discharge Date: 10/30/2016  Physician: Adele Barthel, MD  Admission Diagnosis: End Stage Renal Disease N18.6; Right proximal cephalic vein stenosis 123XX123  HPI:   This is a 56 y.o. male who presented for evaluation for R cephalic vein turndown.  He recent underwent a R arm fistulogram and venoplasty of the proximal cephalic vein near its confluence.   The patient notes no steal sx.  This R BC AVF was placed by Dr. Kellie Simmering in Feb 2016.  Pt also notes recurrent chest pain, worse during HD that improves with oxygen.  The patient has a known history of CHF but has not been seen by Cardiology recently. He was scheduled to see cardiology asap. He was advised to go to the ER if he developed recurrent chest pain.   Hospital Course:  The patient was admitted to the hospital and taken to the operating room on 10/29/2016 and underwent: right cephalic vein turndown.  The patient tolerated the procedure well and was transported to the PACU in stable condition.   He was kept overnight due to lack of transportation. By POD 1, he was doing well. He had a strong thrill in his right arm fistula and his incision was clean, dry and intact. He was discharged home on POD 1 in good condition.     CBC    Component Value Date/Time   WBC 8.8 10/29/2016 1214   RBC 3.32 (L) 10/29/2016 1214   HGB 11.0 (L) 10/29/2016 1214   HCT 33.3 (L) 10/29/2016 1214   HCT 22.2 (L) 01/27/2015 0300   PLT 237 10/29/2016 1214   MCV 100.3 (H) 10/29/2016 1214   MCH 33.1 10/29/2016 1214   MCHC 33.0 10/29/2016 1214   RDW 15.5 10/29/2016 1214   LYMPHSABS 0.8 09/29/2016 0820   MONOABS 1.3 (H) 09/29/2016 0820   EOSABS 0.6 09/29/2016 0820   BASOSABS 0.0 09/29/2016 0820    BMET    Component Value Date/Time   NA 137 10/29/2016 0641   K 3.7 10/29/2016 0641   CL 99 (L) 10/02/2016 1606   CO2 21 (L) 10/02/2016 1606   GLUCOSE 134 (H) 10/29/2016 0641   BUN 68 (H) 10/02/2016 1606   CREATININE 7.36 (H) 10/29/2016 1214   CREATININE 5.40 (H) 02/08/2015 1025   CALCIUM 8.1 (L) 10/02/2016 1606   GFRNONAA 7 (L) 10/29/2016 1214   GFRNONAA 11 (L) 02/08/2015 1025   GFRAA 9 (L) 10/29/2016 1214   GFRAA 13 (L) 02/08/2015 1025     Discharge Instructions:   The patient is discharged to home with extensive instructions on wound care and progressive ambulation.  They are instructed not to drive or perform any heavy lifting until returning to see the physician in his office.  Discharge Instructions    Call MD for:  redness, tenderness, or signs of infection (pain, swelling, bleeding, redness, odor or green/yellow discharge around incision site)    Complete by:  As directed    Call MD for:  severe or increased pain, loss or decreased feeling  in affected limb(s)    Complete by:  As directed    Call MD for:  temperature >100.5    Complete by:  As directed    Discharge instructions    Complete by:  As directed    You may stick the fistula below the new incision.  You may wash your arm in 48 hours.  Driving Restrictions    Complete by:  As directed    No driving for 24 hours   Increase activity slowly    Complete by:  As directed    Walk with assistance use walker or cane as needed   Lifting restrictions    Complete by:  As directed    No heavy lifting for 2-3 weeks   Resume previous diet    Complete by:  As directed       Discharge Diagnosis:  End Stage Renal Disease N18.6; Right proximal cephalic vein stenosis 123XX123  Secondary Diagnosis: Patient Active Problem List   Diagnosis Date Noted  . ESRD on dialysis ([place]) 10/29/2016  . Primary osteoarthritis of right knee 10/10/2016  . Elevated troponin   . Hypertensive cardiomyopathy, without heart failure ([place])   . HCAP (healthcare-associated pneumonia) 09/29/2016  . Pneumonia 09/29/2016  . Chronic pain of right knee 08/08/2016   . Mechanical knee pain, right 07/20/2016  . Trigger finger, left ring finger 07/20/2016  . Chronic bilateral low back pain without sciatica 07/20/2016  . Chest pain with moderate risk of acute coronary syndrome 12/21/2015  . Poor dentition 07/06/2015  . Lateral pain of left hip 07/06/2015  . Erectile dysfunction 07/06/2015  . End stage renal disease on dialysis ([place]) 03/15/2015  . Major depressive disorder, single episode, moderate ([place]) 02/09/2015  . Stimulant use disorder (cocaine) 02/09/2015  . Alcohol use disorder, moderate, dependence ([place]) 02/09/2015  . Chronic diastolic heart failure ([place])   . IgA monoclonal gammopathy 12/28/2014  . GERD (gastroesophageal reflux disease) 11/08/2014  . Non-insulin-dependent diabetes mellitus with renal complications ([place]) [id number]  . Essential hypertension 08/27/2011  . Anemia of chronic renal failure, stage 4 (severe) ([place]) 08/27/2011   Past Medical History:  Diagnosis Date  . Anemia   . Cellulitis and abscess of foot 08/24/2011  . Cocaine abuse 2016  . Diastolic congestive heart failure ([place])   . ESRD on dialysis ([place])    "TTS; [address]" (10/29/2016)  . GERD (gastroesophageal reflux disease)    unsure  . HCVD (hypertensive cardiovascular disease) 09/2016   Echo shows moderate to severe LAE, grade 1 DD  . Hypertension 2015  . Pneumonia 10/2016  . Protein calorie malnutrition ([place])   . PVD (peripheral vascular disease) ([place])   . Type II diabetes mellitus ([place]) 2015     Allergies as of 10/30/2016      Reactions   No Known Allergies       Medication List    TAKE these medications   amLODipine 5 MG tablet Commonly known as:  NORVASC Take 1 tablet (5 mg total) by mouth daily.   cinacalcet 30 MG tablet Commonly known as:  SENSIPAR Take 30 mg by mouth at bedtime. Reported on 12/21/2015   lanthanum 1000 MG chewable tablet Commonly known as:  FOSRENOL Chew 1,000 mg by mouth 3 (three) times daily with meals. Reported on  12/21/2015   losartan 25 MG tablet Commonly known as:  COZAAR Take 25 mg by mouth daily. Reported on 12/21/2015   oxyCODONE-acetaminophen 5-325 MG tablet Commonly known as:  PERCOCET/ROXICET Take 1-2 tablets by mouth every 4 (four) hours as needed for moderate pain.       Percocet #20 No Refill  Disposition: Home  Patient's condition: is Good  Follow up: 1. Dr. Bridgett Larsson  in 4 weeks   Virgina Jock, PA-C [facility] [phone] 10/31/2016  3:32 PM  Addendum  I agree with the physician assistant's discharge summary.  This patient underwent R cephalic vein turndown without any complications.  Pt was admitted overnight per [facility] policies as patient did not have transportation home.  Pt had no post-op complications.  He will be seen in the office in 2-4 weeks.   Adele Barthel, MD, FACS [facility] [place] Office: [phone] Pager: [phone]  11/01/2016, 9:09 AM

## 2016-11-01 DIAGNOSIS — N186 End stage renal disease: Secondary | ICD-10-CM | POA: Diagnosis not present

## 2016-11-01 DIAGNOSIS — D631 Anemia in chronic kidney disease: Secondary | ICD-10-CM | POA: Diagnosis not present

## 2016-11-01 DIAGNOSIS — I5031 Acute diastolic (congestive) heart failure: Secondary | ICD-10-CM | POA: Diagnosis not present

## 2016-11-01 DIAGNOSIS — E1029 Type 1 diabetes mellitus with other diabetic kidney complication: Secondary | ICD-10-CM | POA: Diagnosis not present

## 2016-11-01 DIAGNOSIS — Z992 Dependence on renal dialysis: Secondary | ICD-10-CM | POA: Diagnosis not present

## 2016-11-01 DIAGNOSIS — N2581 Secondary hyperparathyroidism of renal origin: Secondary | ICD-10-CM | POA: Diagnosis not present

## 2016-11-02 ENCOUNTER — Other Ambulatory Visit: Payer: Self-pay | Admitting: Family Medicine

## 2016-11-02 ENCOUNTER — Encounter: Payer: Self-pay | Admitting: Family Medicine

## 2016-11-02 ENCOUNTER — Ambulatory Visit: Payer: Medicare Other | Attending: Family Medicine | Admitting: Family Medicine

## 2016-11-02 VITALS — BP 164/92 | HR 86 | Temp 98.0°F | Ht 71.0 in | Wt 197.4 lb

## 2016-11-02 DIAGNOSIS — N189 Chronic kidney disease, unspecified: Secondary | ICD-10-CM | POA: Diagnosis not present

## 2016-11-02 DIAGNOSIS — I10 Essential (primary) hypertension: Secondary | ICD-10-CM

## 2016-11-02 DIAGNOSIS — I129 Hypertensive chronic kidney disease with stage 1 through stage 4 chronic kidney disease, or unspecified chronic kidney disease: Secondary | ICD-10-CM | POA: Diagnosis not present

## 2016-11-02 DIAGNOSIS — Z0000 Encounter for general adult medical examination without abnormal findings: Secondary | ICD-10-CM | POA: Diagnosis not present

## 2016-11-02 DIAGNOSIS — Z1159 Encounter for screening for other viral diseases: Secondary | ICD-10-CM

## 2016-11-02 DIAGNOSIS — N529 Male erectile dysfunction, unspecified: Secondary | ICD-10-CM | POA: Insufficient documentation

## 2016-11-02 DIAGNOSIS — I208 Other forms of angina pectoris: Secondary | ICD-10-CM

## 2016-11-02 DIAGNOSIS — Z992 Dependence on renal dialysis: Secondary | ICD-10-CM | POA: Insufficient documentation

## 2016-11-02 DIAGNOSIS — E1122 Type 2 diabetes mellitus with diabetic chronic kidney disease: Secondary | ICD-10-CM

## 2016-11-02 LAB — HEPATITIS C ANTIBODY: HCV AB: NEGATIVE

## 2016-11-02 LAB — GLUCOSE, POCT (MANUAL RESULT ENTRY): POC GLUCOSE: 208 mg/dL — AB (ref 70–99)

## 2016-11-02 MED ORDER — AMLODIPINE BESYLATE 5 MG PO TABS: 5.0000 mg | ORAL_TABLET | Freq: Every day | ORAL | 5 refills | Status: DC

## 2016-11-02 NOTE — Patient Instructions (Addendum)
[place] was seen today for diabetes.  Diagnoses and all orders for this visit:  Type 2 diabetes mellitus with chronic kidney disease, without long-term current use of insulin, unspecified CKD stage (HCC) -     POCT glucose (manual entry) -     Ambulatory referral to Ophthalmology  Essential hypertension -     amLODipine (NORVASC) 5 MG tablet; Take 1 tablet (5 mg total) by mouth daily.  Need for hepatitis C screening test -     Hepatitis C antibody, reflex  Healthcare maintenance -     Ambulatory referral to Dentistry   F/u in 3 months for HTN and diabetes   Dr. Adrian Blackwater
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
## 2016-11-02 NOTE — Assessment & Plan Note (Signed)
Elevated today Labile in general Continue regimen  Patient closely monitored at dialysis

## 2016-11-02 NOTE — Assessment & Plan Note (Signed)
Diet controlled No medications needed

## 2016-11-03 DIAGNOSIS — I5031 Acute diastolic (congestive) heart failure: Secondary | ICD-10-CM | POA: Diagnosis not present

## 2016-11-03 DIAGNOSIS — N2581 Secondary hyperparathyroidism of renal origin: Secondary | ICD-10-CM | POA: Diagnosis not present

## 2016-11-03 DIAGNOSIS — D631 Anemia in chronic kidney disease: Secondary | ICD-10-CM | POA: Diagnosis not present

## 2016-11-03 DIAGNOSIS — Z992 Dependence on renal dialysis: Secondary | ICD-10-CM | POA: Diagnosis not present

## 2016-11-03 DIAGNOSIS — E1029 Type 1 diabetes mellitus with other diabetic kidney complication: Secondary | ICD-10-CM | POA: Diagnosis not present

## 2016-11-03 DIAGNOSIS — N186 End stage renal disease: Secondary | ICD-10-CM | POA: Diagnosis not present

## 2016-11-05 DIAGNOSIS — K644 Residual hemorrhoidal skin tags: Secondary | ICD-10-CM | POA: Diagnosis not present

## 2016-11-05 DIAGNOSIS — Z1211 Encounter for screening for malignant neoplasm of colon: Secondary | ICD-10-CM | POA: Diagnosis not present

## 2016-11-05 DIAGNOSIS — K635 Polyp of colon: Secondary | ICD-10-CM | POA: Diagnosis not present

## 2016-11-05 DIAGNOSIS — D126 Benign neoplasm of colon, unspecified: Secondary | ICD-10-CM | POA: Diagnosis not present

## 2016-11-05 DIAGNOSIS — K648 Other hemorrhoids: Secondary | ICD-10-CM | POA: Diagnosis not present

## 2016-11-06 DIAGNOSIS — D631 Anemia in chronic kidney disease: Secondary | ICD-10-CM | POA: Diagnosis not present

## 2016-11-06 DIAGNOSIS — I5031 Acute diastolic (congestive) heart failure: Secondary | ICD-10-CM | POA: Diagnosis not present

## 2016-11-06 DIAGNOSIS — N186 End stage renal disease: Secondary | ICD-10-CM | POA: Diagnosis not present

## 2016-11-06 DIAGNOSIS — N2581 Secondary hyperparathyroidism of renal origin: Secondary | ICD-10-CM | POA: Diagnosis not present

## 2016-11-06 DIAGNOSIS — Z992 Dependence on renal dialysis: Secondary | ICD-10-CM | POA: Diagnosis not present

## 2016-11-06 DIAGNOSIS — E1029 Type 1 diabetes mellitus with other diabetic kidney complication: Secondary | ICD-10-CM | POA: Diagnosis not present

## 2016-11-07 ENCOUNTER — Telehealth: Payer: Self-pay

## 2016-11-07 NOTE — Telephone Encounter (Signed)
Pt was called and informed of lab results. 

## 2016-11-08 DIAGNOSIS — D631 Anemia in chronic kidney disease: Secondary | ICD-10-CM | POA: Diagnosis not present

## 2016-11-08 DIAGNOSIS — N2581 Secondary hyperparathyroidism of renal origin: Secondary | ICD-10-CM | POA: Diagnosis not present

## 2016-11-08 DIAGNOSIS — I5031 Acute diastolic (congestive) heart failure: Secondary | ICD-10-CM | POA: Diagnosis not present

## 2016-11-08 DIAGNOSIS — D126 Benign neoplasm of colon, unspecified: Secondary | ICD-10-CM | POA: Diagnosis not present

## 2016-11-08 DIAGNOSIS — Z992 Dependence on renal dialysis: Secondary | ICD-10-CM | POA: Diagnosis not present

## 2016-11-08 DIAGNOSIS — Z1211 Encounter for screening for malignant neoplasm of colon: Secondary | ICD-10-CM | POA: Diagnosis not present

## 2016-11-08 DIAGNOSIS — E1029 Type 1 diabetes mellitus with other diabetic kidney complication: Secondary | ICD-10-CM | POA: Diagnosis not present

## 2016-11-08 DIAGNOSIS — K635 Polyp of colon: Secondary | ICD-10-CM | POA: Diagnosis not present

## 2016-11-08 DIAGNOSIS — N186 End stage renal disease: Secondary | ICD-10-CM | POA: Diagnosis not present

## 2016-11-10 DIAGNOSIS — Z992 Dependence on renal dialysis: Secondary | ICD-10-CM | POA: Diagnosis not present

## 2016-11-10 DIAGNOSIS — N2581 Secondary hyperparathyroidism of renal origin: Secondary | ICD-10-CM | POA: Diagnosis not present

## 2016-11-10 DIAGNOSIS — E1029 Type 1 diabetes mellitus with other diabetic kidney complication: Secondary | ICD-10-CM | POA: Diagnosis not present

## 2016-11-10 DIAGNOSIS — I5031 Acute diastolic (congestive) heart failure: Secondary | ICD-10-CM | POA: Diagnosis not present

## 2016-11-10 DIAGNOSIS — D631 Anemia in chronic kidney disease: Secondary | ICD-10-CM | POA: Diagnosis not present

## 2016-11-10 DIAGNOSIS — N186 End stage renal disease: Secondary | ICD-10-CM | POA: Diagnosis not present

## 2016-11-15 DIAGNOSIS — N186 End stage renal disease: Secondary | ICD-10-CM | POA: Diagnosis not present

## 2016-11-15 DIAGNOSIS — E1029 Type 1 diabetes mellitus with other diabetic kidney complication: Secondary | ICD-10-CM | POA: Diagnosis not present

## 2016-11-15 DIAGNOSIS — N2581 Secondary hyperparathyroidism of renal origin: Secondary | ICD-10-CM | POA: Diagnosis not present

## 2016-11-15 DIAGNOSIS — D631 Anemia in chronic kidney disease: Secondary | ICD-10-CM | POA: Diagnosis not present

## 2016-11-15 DIAGNOSIS — I5031 Acute diastolic (congestive) heart failure: Secondary | ICD-10-CM | POA: Diagnosis not present

## 2016-11-15 DIAGNOSIS — Z992 Dependence on renal dialysis: Secondary | ICD-10-CM | POA: Diagnosis not present

## 2016-11-17 DIAGNOSIS — Z992 Dependence on renal dialysis: Secondary | ICD-10-CM | POA: Diagnosis not present

## 2016-11-17 DIAGNOSIS — I5031 Acute diastolic (congestive) heart failure: Secondary | ICD-10-CM | POA: Diagnosis not present

## 2016-11-17 DIAGNOSIS — N186 End stage renal disease: Secondary | ICD-10-CM | POA: Diagnosis not present

## 2016-11-17 DIAGNOSIS — N2581 Secondary hyperparathyroidism of renal origin: Secondary | ICD-10-CM | POA: Diagnosis not present

## 2016-11-17 DIAGNOSIS — D631 Anemia in chronic kidney disease: Secondary | ICD-10-CM | POA: Diagnosis not present

## 2016-11-17 DIAGNOSIS — E1029 Type 1 diabetes mellitus with other diabetic kidney complication: Secondary | ICD-10-CM | POA: Diagnosis not present

## 2016-11-20 DIAGNOSIS — Z992 Dependence on renal dialysis: Secondary | ICD-10-CM | POA: Diagnosis not present

## 2016-11-20 DIAGNOSIS — N2581 Secondary hyperparathyroidism of renal origin: Secondary | ICD-10-CM | POA: Diagnosis not present

## 2016-11-20 DIAGNOSIS — I5031 Acute diastolic (congestive) heart failure: Secondary | ICD-10-CM | POA: Diagnosis not present

## 2016-11-20 DIAGNOSIS — D631 Anemia in chronic kidney disease: Secondary | ICD-10-CM | POA: Diagnosis not present

## 2016-11-20 DIAGNOSIS — N186 End stage renal disease: Secondary | ICD-10-CM | POA: Diagnosis not present

## 2016-11-20 DIAGNOSIS — E1029 Type 1 diabetes mellitus with other diabetic kidney complication: Secondary | ICD-10-CM | POA: Diagnosis not present

## 2016-11-22 DIAGNOSIS — N186 End stage renal disease: Secondary | ICD-10-CM | POA: Diagnosis not present

## 2016-11-22 DIAGNOSIS — Z992 Dependence on renal dialysis: Secondary | ICD-10-CM | POA: Diagnosis not present

## 2016-11-22 DIAGNOSIS — D631 Anemia in chronic kidney disease: Secondary | ICD-10-CM | POA: Diagnosis not present

## 2016-11-22 DIAGNOSIS — N2581 Secondary hyperparathyroidism of renal origin: Secondary | ICD-10-CM | POA: Diagnosis not present

## 2016-11-22 DIAGNOSIS — E1029 Type 1 diabetes mellitus with other diabetic kidney complication: Secondary | ICD-10-CM | POA: Diagnosis not present

## 2016-11-22 DIAGNOSIS — I5031 Acute diastolic (congestive) heart failure: Secondary | ICD-10-CM | POA: Diagnosis not present

## 2016-11-24 DIAGNOSIS — E1029 Type 1 diabetes mellitus with other diabetic kidney complication: Secondary | ICD-10-CM | POA: Diagnosis not present

## 2016-11-24 DIAGNOSIS — N2581 Secondary hyperparathyroidism of renal origin: Secondary | ICD-10-CM | POA: Diagnosis not present

## 2016-11-24 DIAGNOSIS — N186 End stage renal disease: Secondary | ICD-10-CM | POA: Diagnosis not present

## 2016-11-24 DIAGNOSIS — D631 Anemia in chronic kidney disease: Secondary | ICD-10-CM | POA: Diagnosis not present

## 2016-11-24 DIAGNOSIS — I5031 Acute diastolic (congestive) heart failure: Secondary | ICD-10-CM | POA: Diagnosis not present

## 2016-11-24 DIAGNOSIS — Z992 Dependence on renal dialysis: Secondary | ICD-10-CM | POA: Diagnosis not present

## 2016-11-27 DIAGNOSIS — E1029 Type 1 diabetes mellitus with other diabetic kidney complication: Secondary | ICD-10-CM | POA: Diagnosis not present

## 2016-11-27 DIAGNOSIS — D631 Anemia in chronic kidney disease: Secondary | ICD-10-CM | POA: Diagnosis not present

## 2016-11-27 DIAGNOSIS — Z992 Dependence on renal dialysis: Secondary | ICD-10-CM | POA: Diagnosis not present

## 2016-11-27 DIAGNOSIS — N2581 Secondary hyperparathyroidism of renal origin: Secondary | ICD-10-CM | POA: Diagnosis not present

## 2016-11-27 DIAGNOSIS — I5031 Acute diastolic (congestive) heart failure: Secondary | ICD-10-CM | POA: Diagnosis not present

## 2016-11-27 DIAGNOSIS — N186 End stage renal disease: Secondary | ICD-10-CM | POA: Diagnosis not present

## 2016-11-29 DIAGNOSIS — N2581 Secondary hyperparathyroidism of renal origin: Secondary | ICD-10-CM | POA: Diagnosis not present

## 2016-11-29 DIAGNOSIS — Z992 Dependence on renal dialysis: Secondary | ICD-10-CM | POA: Diagnosis not present

## 2016-11-29 DIAGNOSIS — E1029 Type 1 diabetes mellitus with other diabetic kidney complication: Secondary | ICD-10-CM | POA: Diagnosis not present

## 2016-11-29 DIAGNOSIS — N186 End stage renal disease: Secondary | ICD-10-CM | POA: Diagnosis not present

## 2016-11-29 DIAGNOSIS — D631 Anemia in chronic kidney disease: Secondary | ICD-10-CM | POA: Diagnosis not present

## 2016-11-29 DIAGNOSIS — I5031 Acute diastolic (congestive) heart failure: Secondary | ICD-10-CM | POA: Diagnosis not present

## 2016-12-01 DIAGNOSIS — E1122 Type 2 diabetes mellitus with diabetic chronic kidney disease: Secondary | ICD-10-CM | POA: Diagnosis not present

## 2016-12-01 DIAGNOSIS — E1029 Type 1 diabetes mellitus with other diabetic kidney complication: Secondary | ICD-10-CM | POA: Diagnosis not present

## 2016-12-01 DIAGNOSIS — I5031 Acute diastolic (congestive) heart failure: Secondary | ICD-10-CM | POA: Diagnosis not present

## 2016-12-01 DIAGNOSIS — D631 Anemia in chronic kidney disease: Secondary | ICD-10-CM | POA: Diagnosis not present

## 2016-12-01 DIAGNOSIS — N2581 Secondary hyperparathyroidism of renal origin: Secondary | ICD-10-CM | POA: Diagnosis not present

## 2016-12-01 DIAGNOSIS — N186 End stage renal disease: Secondary | ICD-10-CM | POA: Diagnosis not present

## 2016-12-01 DIAGNOSIS — Z992 Dependence on renal dialysis: Secondary | ICD-10-CM | POA: Diagnosis not present

## 2016-12-04 DIAGNOSIS — N186 End stage renal disease: Secondary | ICD-10-CM | POA: Diagnosis not present

## 2016-12-04 DIAGNOSIS — N2581 Secondary hyperparathyroidism of renal origin: Secondary | ICD-10-CM | POA: Diagnosis not present

## 2016-12-04 DIAGNOSIS — E1029 Type 1 diabetes mellitus with other diabetic kidney complication: Secondary | ICD-10-CM | POA: Diagnosis not present

## 2016-12-04 DIAGNOSIS — I5031 Acute diastolic (congestive) heart failure: Secondary | ICD-10-CM | POA: Diagnosis not present

## 2016-12-06 ENCOUNTER — Encounter: Payer: Self-pay | Admitting: Vascular Surgery

## 2016-12-06 DIAGNOSIS — N186 End stage renal disease: Secondary | ICD-10-CM | POA: Diagnosis not present

## 2016-12-06 DIAGNOSIS — I5031 Acute diastolic (congestive) heart failure: Secondary | ICD-10-CM | POA: Diagnosis not present

## 2016-12-06 DIAGNOSIS — E1029 Type 1 diabetes mellitus with other diabetic kidney complication: Secondary | ICD-10-CM | POA: Diagnosis not present

## 2016-12-06 DIAGNOSIS — N2581 Secondary hyperparathyroidism of renal origin: Secondary | ICD-10-CM | POA: Diagnosis not present

## 2016-12-08 DIAGNOSIS — N2581 Secondary hyperparathyroidism of renal origin: Secondary | ICD-10-CM | POA: Diagnosis not present

## 2016-12-08 DIAGNOSIS — I5031 Acute diastolic (congestive) heart failure: Secondary | ICD-10-CM | POA: Diagnosis not present

## 2016-12-08 DIAGNOSIS — E1029 Type 1 diabetes mellitus with other diabetic kidney complication: Secondary | ICD-10-CM | POA: Diagnosis not present

## 2016-12-08 DIAGNOSIS — N186 End stage renal disease: Secondary | ICD-10-CM | POA: Diagnosis not present

## 2016-12-11 DIAGNOSIS — N186 End stage renal disease: Secondary | ICD-10-CM | POA: Diagnosis not present

## 2016-12-11 DIAGNOSIS — E1029 Type 1 diabetes mellitus with other diabetic kidney complication: Secondary | ICD-10-CM | POA: Diagnosis not present

## 2016-12-11 DIAGNOSIS — N2581 Secondary hyperparathyroidism of renal origin: Secondary | ICD-10-CM | POA: Diagnosis not present

## 2016-12-11 DIAGNOSIS — I5031 Acute diastolic (congestive) heart failure: Secondary | ICD-10-CM | POA: Diagnosis not present

## 2016-12-11 NOTE — Progress Notes (Signed)
    Postoperative Access Visit   History of Present Illness  [name] is a 56 y.o. year old male who presents for postoperative follow-up for: R cephalic vein turndown (Date: 10/29/16).  The patient's wounds are healed.  The patient notes no steal symptoms.  The patient is able to complete their activities of daily living.  The patient's current symptoms are: none.  For VQI Use Only  PRE-ADM LIVING: Home  AMB STATUS: Ambulatory   Physical Examination Vitals:   12/14/16 0926 12/14/16 0927  BP: (!) 158/92 (!) 153/89  Pulse: 87   Resp: 18   Temp: 97.6 F (36.4 C)     RUE: Incisions are  healed, skin feels warm, hand grip is 5/5, sensation in digits is intact, palpable thrill, bruit can be auscultated, moderate size distal PSA with smaller one more proximal, fistula is visible throughout most of its length   Medical Decision Making  [name] is a 56 y.o. year old male who presents s/p R cephalic vein turndown.   HD have been using the R BC AVF since the surgery.  In the event of bleeding from the PSA, plication might be an option.  Thank you for allowing [place] to participate in this patient's care.  Adele Barthel, MD, FACS [facility] Office: [phone] Pager: [phone]

## 2016-12-13 DIAGNOSIS — E1029 Type 1 diabetes mellitus with other diabetic kidney complication: Secondary | ICD-10-CM | POA: Diagnosis not present

## 2016-12-13 DIAGNOSIS — I5031 Acute diastolic (congestive) heart failure: Secondary | ICD-10-CM | POA: Diagnosis not present

## 2016-12-13 DIAGNOSIS — N2581 Secondary hyperparathyroidism of renal origin: Secondary | ICD-10-CM | POA: Diagnosis not present

## 2016-12-13 DIAGNOSIS — N186 End stage renal disease: Secondary | ICD-10-CM | POA: Diagnosis not present

## 2016-12-14 ENCOUNTER — Encounter: Payer: Self-pay | Admitting: Vascular Surgery

## 2016-12-14 ENCOUNTER — Ambulatory Visit (INDEPENDENT_AMBULATORY_CARE_PROVIDER_SITE_OTHER): Payer: Self-pay | Admitting: Vascular Surgery

## 2016-12-14 VITALS — BP 153/89 | HR 87 | Temp 97.6°F | Resp 18 | Ht 71.0 in | Wt 202.6 lb

## 2016-12-14 DIAGNOSIS — Z992 Dependence on renal dialysis: Secondary | ICD-10-CM

## 2016-12-14 DIAGNOSIS — N186 End stage renal disease: Secondary | ICD-10-CM

## 2016-12-15 DIAGNOSIS — E1029 Type 1 diabetes mellitus with other diabetic kidney complication: Secondary | ICD-10-CM | POA: Diagnosis not present

## 2016-12-15 DIAGNOSIS — I5031 Acute diastolic (congestive) heart failure: Secondary | ICD-10-CM | POA: Diagnosis not present

## 2016-12-15 DIAGNOSIS — N186 End stage renal disease: Secondary | ICD-10-CM | POA: Diagnosis not present

## 2016-12-15 DIAGNOSIS — N2581 Secondary hyperparathyroidism of renal origin: Secondary | ICD-10-CM | POA: Diagnosis not present

## 2016-12-18 DIAGNOSIS — I5031 Acute diastolic (congestive) heart failure: Secondary | ICD-10-CM | POA: Diagnosis not present

## 2016-12-18 DIAGNOSIS — E1029 Type 1 diabetes mellitus with other diabetic kidney complication: Secondary | ICD-10-CM | POA: Diagnosis not present

## 2016-12-18 DIAGNOSIS — N2581 Secondary hyperparathyroidism of renal origin: Secondary | ICD-10-CM | POA: Diagnosis not present

## 2016-12-18 DIAGNOSIS — N186 End stage renal disease: Secondary | ICD-10-CM | POA: Diagnosis not present

## 2016-12-20 DIAGNOSIS — I5031 Acute diastolic (congestive) heart failure: Secondary | ICD-10-CM | POA: Diagnosis not present

## 2016-12-20 DIAGNOSIS — N2581 Secondary hyperparathyroidism of renal origin: Secondary | ICD-10-CM | POA: Diagnosis not present

## 2016-12-20 DIAGNOSIS — N186 End stage renal disease: Secondary | ICD-10-CM | POA: Diagnosis not present

## 2016-12-20 DIAGNOSIS — E1029 Type 1 diabetes mellitus with other diabetic kidney complication: Secondary | ICD-10-CM | POA: Diagnosis not present

## 2016-12-21 DIAGNOSIS — N2581 Secondary hyperparathyroidism of renal origin: Secondary | ICD-10-CM | POA: Diagnosis not present

## 2016-12-21 DIAGNOSIS — N186 End stage renal disease: Secondary | ICD-10-CM | POA: Diagnosis not present

## 2016-12-21 DIAGNOSIS — E1029 Type 1 diabetes mellitus with other diabetic kidney complication: Secondary | ICD-10-CM | POA: Diagnosis not present

## 2016-12-21 DIAGNOSIS — I5031 Acute diastolic (congestive) heart failure: Secondary | ICD-10-CM | POA: Diagnosis not present

## 2016-12-24 DIAGNOSIS — Z125 Encounter for screening for malignant neoplasm of prostate: Secondary | ICD-10-CM | POA: Diagnosis not present

## 2016-12-24 DIAGNOSIS — N5201 Erectile dysfunction due to arterial insufficiency: Secondary | ICD-10-CM | POA: Diagnosis not present

## 2016-12-25 DIAGNOSIS — N186 End stage renal disease: Secondary | ICD-10-CM | POA: Diagnosis not present

## 2016-12-25 DIAGNOSIS — E1029 Type 1 diabetes mellitus with other diabetic kidney complication: Secondary | ICD-10-CM | POA: Diagnosis not present

## 2016-12-25 DIAGNOSIS — I5031 Acute diastolic (congestive) heart failure: Secondary | ICD-10-CM | POA: Diagnosis not present

## 2016-12-25 DIAGNOSIS — N2581 Secondary hyperparathyroidism of renal origin: Secondary | ICD-10-CM | POA: Diagnosis not present

## 2016-12-26 ENCOUNTER — Telehealth: Payer: Self-pay | Admitting: Family Medicine

## 2016-12-26 DIAGNOSIS — M25551 Pain in right hip: Secondary | ICD-10-CM

## 2016-12-26 NOTE — Telephone Encounter (Signed)
Will route to PCP 

## 2016-12-26 NOTE — Telephone Encounter (Signed)
PT. Called to speak with the PCP since he need a referral for a hip specialist since the right side is in pain , please follow with patient

## 2016-12-27 DIAGNOSIS — E1029 Type 1 diabetes mellitus with other diabetic kidney complication: Secondary | ICD-10-CM | POA: Diagnosis not present

## 2016-12-27 DIAGNOSIS — N2581 Secondary hyperparathyroidism of renal origin: Secondary | ICD-10-CM | POA: Diagnosis not present

## 2016-12-27 DIAGNOSIS — N186 End stage renal disease: Secondary | ICD-10-CM | POA: Diagnosis not present

## 2016-12-27 DIAGNOSIS — I5031 Acute diastolic (congestive) heart failure: Secondary | ICD-10-CM | POA: Diagnosis not present

## 2016-12-27 NOTE — Telephone Encounter (Signed)
Please inform patient that this complaint of R hip pain is new X-ray ordered to evaluate pain is actually from hip OV also needed

## 2016-12-29 DIAGNOSIS — N186 End stage renal disease: Secondary | ICD-10-CM | POA: Diagnosis not present

## 2016-12-29 DIAGNOSIS — N2581 Secondary hyperparathyroidism of renal origin: Secondary | ICD-10-CM | POA: Diagnosis not present

## 2016-12-29 DIAGNOSIS — E1029 Type 1 diabetes mellitus with other diabetic kidney complication: Secondary | ICD-10-CM | POA: Diagnosis not present

## 2016-12-29 DIAGNOSIS — I5031 Acute diastolic (congestive) heart failure: Secondary | ICD-10-CM | POA: Diagnosis not present

## 2016-12-31 DIAGNOSIS — E1122 Type 2 diabetes mellitus with diabetic chronic kidney disease: Secondary | ICD-10-CM | POA: Diagnosis not present

## 2017-01-01 DIAGNOSIS — E1029 Type 1 diabetes mellitus with other diabetic kidney complication: Secondary | ICD-10-CM | POA: Diagnosis not present

## 2017-01-01 DIAGNOSIS — N186 End stage renal disease: Secondary | ICD-10-CM | POA: Diagnosis not present

## 2017-01-01 DIAGNOSIS — D631 Anemia in chronic kidney disease: Secondary | ICD-10-CM | POA: Diagnosis not present

## 2017-01-01 DIAGNOSIS — N2581 Secondary hyperparathyroidism of renal origin: Secondary | ICD-10-CM | POA: Diagnosis not present

## 2017-01-01 DIAGNOSIS — D509 Iron deficiency anemia, unspecified: Secondary | ICD-10-CM | POA: Diagnosis not present

## 2017-01-02 NOTE — Telephone Encounter (Signed)
Pt was called and the number on file is not correct.

## 2017-01-03 DIAGNOSIS — D631 Anemia in chronic kidney disease: Secondary | ICD-10-CM | POA: Diagnosis not present

## 2017-01-03 DIAGNOSIS — E1029 Type 1 diabetes mellitus with other diabetic kidney complication: Secondary | ICD-10-CM | POA: Diagnosis not present

## 2017-01-03 DIAGNOSIS — D509 Iron deficiency anemia, unspecified: Secondary | ICD-10-CM | POA: Diagnosis not present

## 2017-01-03 DIAGNOSIS — N186 End stage renal disease: Secondary | ICD-10-CM | POA: Diagnosis not present

## 2017-01-03 DIAGNOSIS — N2581 Secondary hyperparathyroidism of renal origin: Secondary | ICD-10-CM | POA: Diagnosis not present

## 2017-01-05 DIAGNOSIS — D509 Iron deficiency anemia, unspecified: Secondary | ICD-10-CM | POA: Diagnosis not present

## 2017-01-05 DIAGNOSIS — E1029 Type 1 diabetes mellitus with other diabetic kidney complication: Secondary | ICD-10-CM | POA: Diagnosis not present

## 2017-01-05 DIAGNOSIS — D631 Anemia in chronic kidney disease: Secondary | ICD-10-CM | POA: Diagnosis not present

## 2017-01-05 DIAGNOSIS — N186 End stage renal disease: Secondary | ICD-10-CM | POA: Diagnosis not present

## 2017-01-05 DIAGNOSIS — N2581 Secondary hyperparathyroidism of renal origin: Secondary | ICD-10-CM | POA: Diagnosis not present

## 2017-01-07 ENCOUNTER — Ambulatory Visit (INDEPENDENT_AMBULATORY_CARE_PROVIDER_SITE_OTHER): Payer: Medicare Other | Admitting: Orthopedic Surgery

## 2017-01-08 DIAGNOSIS — D509 Iron deficiency anemia, unspecified: Secondary | ICD-10-CM | POA: Diagnosis not present

## 2017-01-08 DIAGNOSIS — E1029 Type 1 diabetes mellitus with other diabetic kidney complication: Secondary | ICD-10-CM | POA: Diagnosis not present

## 2017-01-08 DIAGNOSIS — D631 Anemia in chronic kidney disease: Secondary | ICD-10-CM | POA: Diagnosis not present

## 2017-01-08 DIAGNOSIS — N186 End stage renal disease: Secondary | ICD-10-CM | POA: Diagnosis not present

## 2017-01-08 DIAGNOSIS — N2581 Secondary hyperparathyroidism of renal origin: Secondary | ICD-10-CM | POA: Diagnosis not present

## 2017-01-10 DIAGNOSIS — D509 Iron deficiency anemia, unspecified: Secondary | ICD-10-CM | POA: Diagnosis not present

## 2017-01-10 DIAGNOSIS — N186 End stage renal disease: Secondary | ICD-10-CM | POA: Diagnosis not present

## 2017-01-10 DIAGNOSIS — N2581 Secondary hyperparathyroidism of renal origin: Secondary | ICD-10-CM | POA: Diagnosis not present

## 2017-01-10 DIAGNOSIS — D631 Anemia in chronic kidney disease: Secondary | ICD-10-CM | POA: Diagnosis not present

## 2017-01-10 DIAGNOSIS — E1029 Type 1 diabetes mellitus with other diabetic kidney complication: Secondary | ICD-10-CM | POA: Diagnosis not present

## 2017-01-12 DIAGNOSIS — N186 End stage renal disease: Secondary | ICD-10-CM | POA: Diagnosis not present

## 2017-01-12 DIAGNOSIS — D509 Iron deficiency anemia, unspecified: Secondary | ICD-10-CM | POA: Diagnosis not present

## 2017-01-12 DIAGNOSIS — E1029 Type 1 diabetes mellitus with other diabetic kidney complication: Secondary | ICD-10-CM | POA: Diagnosis not present

## 2017-01-12 DIAGNOSIS — N2581 Secondary hyperparathyroidism of renal origin: Secondary | ICD-10-CM | POA: Diagnosis not present

## 2017-01-12 DIAGNOSIS — D631 Anemia in chronic kidney disease: Secondary | ICD-10-CM | POA: Diagnosis not present

## 2017-01-15 DIAGNOSIS — D509 Iron deficiency anemia, unspecified: Secondary | ICD-10-CM | POA: Diagnosis not present

## 2017-01-15 DIAGNOSIS — N2581 Secondary hyperparathyroidism of renal origin: Secondary | ICD-10-CM | POA: Diagnosis not present

## 2017-01-15 DIAGNOSIS — N186 End stage renal disease: Secondary | ICD-10-CM | POA: Diagnosis not present

## 2017-01-15 DIAGNOSIS — D631 Anemia in chronic kidney disease: Secondary | ICD-10-CM | POA: Diagnosis not present

## 2017-01-15 DIAGNOSIS — E1029 Type 1 diabetes mellitus with other diabetic kidney complication: Secondary | ICD-10-CM | POA: Diagnosis not present

## 2017-01-16 ENCOUNTER — Encounter: Payer: Self-pay | Admitting: Family Medicine

## 2017-01-19 DIAGNOSIS — D509 Iron deficiency anemia, unspecified: Secondary | ICD-10-CM | POA: Diagnosis not present

## 2017-01-19 DIAGNOSIS — N2581 Secondary hyperparathyroidism of renal origin: Secondary | ICD-10-CM | POA: Diagnosis not present

## 2017-01-19 DIAGNOSIS — E1029 Type 1 diabetes mellitus with other diabetic kidney complication: Secondary | ICD-10-CM | POA: Diagnosis not present

## 2017-01-19 DIAGNOSIS — N186 End stage renal disease: Secondary | ICD-10-CM | POA: Diagnosis not present

## 2017-01-19 DIAGNOSIS — D631 Anemia in chronic kidney disease: Secondary | ICD-10-CM | POA: Diagnosis not present

## 2017-01-22 DIAGNOSIS — E1029 Type 1 diabetes mellitus with other diabetic kidney complication: Secondary | ICD-10-CM | POA: Diagnosis not present

## 2017-01-22 DIAGNOSIS — D631 Anemia in chronic kidney disease: Secondary | ICD-10-CM | POA: Diagnosis not present

## 2017-01-22 DIAGNOSIS — N2581 Secondary hyperparathyroidism of renal origin: Secondary | ICD-10-CM | POA: Diagnosis not present

## 2017-01-22 DIAGNOSIS — N186 End stage renal disease: Secondary | ICD-10-CM | POA: Diagnosis not present

## 2017-01-22 DIAGNOSIS — D509 Iron deficiency anemia, unspecified: Secondary | ICD-10-CM | POA: Diagnosis not present

## 2017-01-23 ENCOUNTER — Ambulatory Visit (INDEPENDENT_AMBULATORY_CARE_PROVIDER_SITE_OTHER): Payer: Medicare Other | Admitting: Podiatry

## 2017-01-23 ENCOUNTER — Encounter: Payer: Self-pay | Admitting: Podiatry

## 2017-01-23 DIAGNOSIS — E1151 Type 2 diabetes mellitus with diabetic peripheral angiopathy without gangrene: Secondary | ICD-10-CM

## 2017-01-23 DIAGNOSIS — L608 Other nail disorders: Secondary | ICD-10-CM | POA: Diagnosis not present

## 2017-01-23 DIAGNOSIS — E1142 Type 2 diabetes mellitus with diabetic polyneuropathy: Secondary | ICD-10-CM | POA: Diagnosis not present

## 2017-01-23 NOTE — Progress Notes (Signed)
Patient ID: [name], male   DOB: 1961/09/01, 56 y.o.   MRN: [id number]    Subjective: This patient presents complaining of elongated toenails when walking and wearing shoes and request toenail debridement  Objective:  patient appears pleasant orientated 3  Vascular: No peripheral edema noted bilaterally DP and PT pulses 0/4 bilaterally Capillary reflex within normal limits bilaterally  Neurological: Sensation to 10 g monofilament wire intact 4/5 bilaterally Vibratory sensation nonreactive right reactive left Ankle reflex were equal and reactive bilaterally  Dermatological: No open skin lesions bilaterally Dry atrophic skin bilaterally without hair growth The toenails are elongated, incurvated with occasional texture and color changes  Musculoskeletal: Amputation distal left hallux with well-healed incision Rigid hammertoe second left without any signs of irritation on the second left toe There is no restriction ankle, subtalar, midtarsal joints  Assessment: Incurvated toenails 6-10 Diabetic peripheral arterial disease Diabetic peripheral neuropathy Hammertoe second left  Plan: Debridement toenails 6-10 mechanically and electrically without any bleeding  Reappoint 3 months

## 2017-01-23 NOTE — Patient Instructions (Signed)

## 2017-01-24 DIAGNOSIS — D509 Iron deficiency anemia, unspecified: Secondary | ICD-10-CM | POA: Diagnosis not present

## 2017-01-24 DIAGNOSIS — E1029 Type 1 diabetes mellitus with other diabetic kidney complication: Secondary | ICD-10-CM | POA: Diagnosis not present

## 2017-01-24 DIAGNOSIS — N186 End stage renal disease: Secondary | ICD-10-CM | POA: Diagnosis not present

## 2017-01-24 DIAGNOSIS — D631 Anemia in chronic kidney disease: Secondary | ICD-10-CM | POA: Diagnosis not present

## 2017-01-24 DIAGNOSIS — N2581 Secondary hyperparathyroidism of renal origin: Secondary | ICD-10-CM | POA: Diagnosis not present

## 2017-01-26 DIAGNOSIS — D631 Anemia in chronic kidney disease: Secondary | ICD-10-CM | POA: Diagnosis not present

## 2017-01-26 DIAGNOSIS — E1029 Type 1 diabetes mellitus with other diabetic kidney complication: Secondary | ICD-10-CM | POA: Diagnosis not present

## 2017-01-26 DIAGNOSIS — N186 End stage renal disease: Secondary | ICD-10-CM | POA: Diagnosis not present

## 2017-01-26 DIAGNOSIS — N2581 Secondary hyperparathyroidism of renal origin: Secondary | ICD-10-CM | POA: Diagnosis not present

## 2017-01-26 DIAGNOSIS — D509 Iron deficiency anemia, unspecified: Secondary | ICD-10-CM | POA: Diagnosis not present

## 2017-01-29 ENCOUNTER — Telehealth: Payer: Self-pay | Admitting: Physician Assistant

## 2017-01-29 NOTE — Telephone Encounter (Signed)
Received records from [facility] for appointment on 02/11/17 with Rosaria Ferries, PA.  Records put with Rhonda's schedule for 02/11/17. lp

## 2017-01-30 DIAGNOSIS — D631 Anemia in chronic kidney disease: Secondary | ICD-10-CM | POA: Diagnosis not present

## 2017-01-30 DIAGNOSIS — N2581 Secondary hyperparathyroidism of renal origin: Secondary | ICD-10-CM | POA: Diagnosis not present

## 2017-01-30 DIAGNOSIS — N186 End stage renal disease: Secondary | ICD-10-CM | POA: Diagnosis not present

## 2017-01-30 DIAGNOSIS — D509 Iron deficiency anemia, unspecified: Secondary | ICD-10-CM | POA: Diagnosis not present

## 2017-01-30 DIAGNOSIS — E1029 Type 1 diabetes mellitus with other diabetic kidney complication: Secondary | ICD-10-CM | POA: Diagnosis not present

## 2017-01-31 DIAGNOSIS — E1122 Type 2 diabetes mellitus with diabetic chronic kidney disease: Secondary | ICD-10-CM | POA: Diagnosis not present

## 2017-01-31 DIAGNOSIS — Z992 Dependence on renal dialysis: Secondary | ICD-10-CM | POA: Diagnosis not present

## 2017-01-31 DIAGNOSIS — E1029 Type 1 diabetes mellitus with other diabetic kidney complication: Secondary | ICD-10-CM | POA: Diagnosis not present

## 2017-01-31 DIAGNOSIS — N186 End stage renal disease: Secondary | ICD-10-CM | POA: Diagnosis not present

## 2017-01-31 DIAGNOSIS — D509 Iron deficiency anemia, unspecified: Secondary | ICD-10-CM | POA: Diagnosis not present

## 2017-01-31 DIAGNOSIS — N2581 Secondary hyperparathyroidism of renal origin: Secondary | ICD-10-CM | POA: Diagnosis not present

## 2017-01-31 DIAGNOSIS — D631 Anemia in chronic kidney disease: Secondary | ICD-10-CM | POA: Diagnosis not present

## 2017-02-02 DIAGNOSIS — E1029 Type 1 diabetes mellitus with other diabetic kidney complication: Secondary | ICD-10-CM | POA: Diagnosis not present

## 2017-02-02 DIAGNOSIS — N186 End stage renal disease: Secondary | ICD-10-CM | POA: Diagnosis not present

## 2017-02-02 DIAGNOSIS — D509 Iron deficiency anemia, unspecified: Secondary | ICD-10-CM | POA: Diagnosis not present

## 2017-02-02 DIAGNOSIS — N2581 Secondary hyperparathyroidism of renal origin: Secondary | ICD-10-CM | POA: Diagnosis not present

## 2017-02-02 DIAGNOSIS — D631 Anemia in chronic kidney disease: Secondary | ICD-10-CM | POA: Diagnosis not present

## 2017-02-04 ENCOUNTER — Ambulatory Visit: Payer: Self-pay | Admitting: Family Medicine

## 2017-02-04 ENCOUNTER — Emergency Department (HOSPITAL_COMMUNITY)
Admission: EM | Admit: 2017-02-04 | Discharge: 2017-02-05 | Disposition: A | Discharge status: Home / Self Care | Payer: Medicare Other | Attending: Emergency Medicine | Admitting: Emergency Medicine

## 2017-02-04 DIAGNOSIS — N186 End stage renal disease: Secondary | ICD-10-CM | POA: Insufficient documentation

## 2017-02-04 DIAGNOSIS — Z79899 Other long term (current) drug therapy: Secondary | ICD-10-CM | POA: Insufficient documentation

## 2017-02-04 DIAGNOSIS — R0789 Other chest pain: Secondary | ICD-10-CM | POA: Insufficient documentation

## 2017-02-04 DIAGNOSIS — R079 Chest pain, unspecified: Secondary | ICD-10-CM | POA: Diagnosis not present

## 2017-02-04 DIAGNOSIS — I132 Hypertensive heart and chronic kidney disease with heart failure and with stage 5 chronic kidney disease, or end stage renal disease: Secondary | ICD-10-CM | POA: Diagnosis not present

## 2017-02-04 DIAGNOSIS — Z992 Dependence on renal dialysis: Secondary | ICD-10-CM | POA: Insufficient documentation

## 2017-02-04 DIAGNOSIS — I5032 Chronic diastolic (congestive) heart failure: Secondary | ICD-10-CM | POA: Diagnosis not present

## 2017-02-04 DIAGNOSIS — E1122 Type 2 diabetes mellitus with diabetic chronic kidney disease: Secondary | ICD-10-CM | POA: Insufficient documentation
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
## 2017-02-05 ENCOUNTER — Emergency Department (HOSPITAL_COMMUNITY): Payer: Medicare Other

## 2017-02-05 DIAGNOSIS — R0789 Other chest pain: Secondary | ICD-10-CM | POA: Diagnosis not present

## 2017-02-05 DIAGNOSIS — R079 Chest pain, unspecified: Secondary | ICD-10-CM | POA: Diagnosis not present

## 2017-02-05 LAB — BASIC METABOLIC PANEL
ANION GAP: 11 (ref 5–15)
BUN: 57 mg/dL — ABNORMAL HIGH (ref 6–20)
CALCIUM: 7 mg/dL — AB (ref 8.9–10.3)
CO2: 21 mmol/L — ABNORMAL LOW (ref 22–32)
Chloride: 103 mmol/L (ref 101–111)
Creatinine, Ser: 9.05 mg/dL — ABNORMAL HIGH (ref 0.61–1.24)
GFR, EST AFRICAN AMERICAN: 7 mL/min — AB (ref >60)
GFR, EST NON AFRICAN AMERICAN: 6 mL/min — AB (ref >60)
GLUCOSE: 159 mg/dL — AB (ref 65–99)
POTASSIUM: 4.3 mmol/L (ref 3.5–5.1)
SODIUM: 135 mmol/L (ref 135–145)

## 2017-02-05 LAB — CBC
HEMATOCRIT: 31.4 % — AB (ref 39.0–52.0)
HEMOGLOBIN: 10.2 g/dL — AB (ref 13.0–17.0)
MCH: 30.7 pg (ref 26.0–34.0)
MCHC: 32.5 g/dL (ref 30.0–36.0)
MCV: 94.6 fL (ref 78.0–100.0)
Platelets: 332 10*3/uL (ref 150–400)
RBC: 3.32 MIL/uL — AB (ref 4.22–5.81)
RDW: 15.8 % — ABNORMAL HIGH (ref 11.5–15.5)
WBC: 9.9 10*3/uL (ref 4.0–10.5)

## 2017-02-05 LAB — I-STAT TROPONIN, ED
TROPONIN I, POC: 0.03 ng/mL (ref 0.00–0.08)
TROPONIN I, POC: 0.03 ng/mL (ref 0.00–0.08)

## 2017-02-05 MED ORDER — TRAMADOL HCL 50 MG PO TABS: 50.0000 mg | ORAL_TABLET | Freq: Four times a day (QID) | ORAL | 0 refills | Status: DC | PRN

## 2017-02-05 NOTE — ED Triage Notes (Signed)
Pt from home via [facility]. Pt c/o L sided CP that started after arguing w/ fiancee. Rates pain @ 6/10. Endorses SHOB, and diaphoresis. Sts pain worse w/ inspiration & palpation. A&O x4 @ on arrival.

## 2017-02-06 DIAGNOSIS — D631 Anemia in chronic kidney disease: Secondary | ICD-10-CM | POA: Diagnosis not present

## 2017-02-06 DIAGNOSIS — N186 End stage renal disease: Secondary | ICD-10-CM | POA: Diagnosis not present

## 2017-02-06 DIAGNOSIS — E1029 Type 1 diabetes mellitus with other diabetic kidney complication: Secondary | ICD-10-CM | POA: Diagnosis not present

## 2017-02-06 DIAGNOSIS — D509 Iron deficiency anemia, unspecified: Secondary | ICD-10-CM | POA: Diagnosis not present

## 2017-02-06 DIAGNOSIS — N2581 Secondary hyperparathyroidism of renal origin: Secondary | ICD-10-CM | POA: Diagnosis not present

## 2017-02-07 DIAGNOSIS — N2581 Secondary hyperparathyroidism of renal origin: Secondary | ICD-10-CM | POA: Diagnosis not present

## 2017-02-07 DIAGNOSIS — E1029 Type 1 diabetes mellitus with other diabetic kidney complication: Secondary | ICD-10-CM | POA: Diagnosis not present

## 2017-02-07 DIAGNOSIS — N186 End stage renal disease: Secondary | ICD-10-CM | POA: Diagnosis not present

## 2017-02-07 DIAGNOSIS — D631 Anemia in chronic kidney disease: Secondary | ICD-10-CM | POA: Diagnosis not present

## 2017-02-07 DIAGNOSIS — D509 Iron deficiency anemia, unspecified: Secondary | ICD-10-CM | POA: Diagnosis not present

## 2017-02-09 DIAGNOSIS — N2581 Secondary hyperparathyroidism of renal origin: Secondary | ICD-10-CM | POA: Diagnosis not present

## 2017-02-09 DIAGNOSIS — N186 End stage renal disease: Secondary | ICD-10-CM | POA: Diagnosis not present

## 2017-02-09 DIAGNOSIS — D631 Anemia in chronic kidney disease: Secondary | ICD-10-CM | POA: Diagnosis not present

## 2017-02-09 DIAGNOSIS — D509 Iron deficiency anemia, unspecified: Secondary | ICD-10-CM | POA: Diagnosis not present

## 2017-02-09 DIAGNOSIS — E1029 Type 1 diabetes mellitus with other diabetic kidney complication: Secondary | ICD-10-CM | POA: Diagnosis not present

## 2017-02-11 ENCOUNTER — Encounter: Payer: Self-pay | Admitting: Physician Assistant

## 2017-02-11 ENCOUNTER — Ambulatory Visit (INDEPENDENT_AMBULATORY_CARE_PROVIDER_SITE_OTHER): Payer: Medicare Other | Admitting: Physician Assistant

## 2017-02-11 VITALS — BP 136/62 | HR 75 | Ht 71.0 in | Wt 204.0 lb

## 2017-02-11 DIAGNOSIS — R079 Chest pain, unspecified: Secondary | ICD-10-CM

## 2017-02-11 DIAGNOSIS — R0789 Other chest pain: Secondary | ICD-10-CM | POA: Diagnosis not present

## 2017-02-11 DIAGNOSIS — I208 Other forms of angina pectoris: Secondary | ICD-10-CM

## 2017-02-11 MED ORDER — MELOXICAM 7.5 MG PO TABS: 7.5000 mg | ORAL_TABLET | Freq: Every day | ORAL | 0 refills | Status: DC

## 2017-02-11 NOTE — Patient Instructions (Addendum)
Your physician has recommended you make the following change in your medication:  -- START meloxicam (Mobic) 7.5mg  - take once daily for 3 weeks  Your physician has requested that you have an echocardiogram @ [address] - [address]rd [address]. Echocardiography is a painless test that uses sound waves to create images of your heart. It provides your doctor with information about the size and shape of your heart and how well your heart's chambers and valves are working. This procedure takes approximately one hour. There are no restrictions for this procedure.  Your physician recommends that you schedule a follow-up appointment with Dr. Oval Linsey after echocardiogram.
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
## 2017-02-12 DIAGNOSIS — D509 Iron deficiency anemia, unspecified: Secondary | ICD-10-CM | POA: Diagnosis not present

## 2017-02-12 DIAGNOSIS — E1029 Type 1 diabetes mellitus with other diabetic kidney complication: Secondary | ICD-10-CM | POA: Diagnosis not present

## 2017-02-12 DIAGNOSIS — N186 End stage renal disease: Secondary | ICD-10-CM | POA: Diagnosis not present

## 2017-02-12 DIAGNOSIS — D631 Anemia in chronic kidney disease: Secondary | ICD-10-CM | POA: Diagnosis not present

## 2017-02-12 DIAGNOSIS — N2581 Secondary hyperparathyroidism of renal origin: Secondary | ICD-10-CM | POA: Diagnosis not present

## 2017-02-14 DIAGNOSIS — N186 End stage renal disease: Secondary | ICD-10-CM | POA: Diagnosis not present

## 2017-02-14 DIAGNOSIS — N2581 Secondary hyperparathyroidism of renal origin: Secondary | ICD-10-CM | POA: Diagnosis not present

## 2017-02-14 DIAGNOSIS — D509 Iron deficiency anemia, unspecified: Secondary | ICD-10-CM | POA: Diagnosis not present

## 2017-02-14 DIAGNOSIS — E1029 Type 1 diabetes mellitus with other diabetic kidney complication: Secondary | ICD-10-CM | POA: Diagnosis not present

## 2017-02-14 DIAGNOSIS — D631 Anemia in chronic kidney disease: Secondary | ICD-10-CM | POA: Diagnosis not present

## 2017-02-16 DIAGNOSIS — N186 End stage renal disease: Secondary | ICD-10-CM | POA: Diagnosis not present

## 2017-02-16 DIAGNOSIS — D631 Anemia in chronic kidney disease: Secondary | ICD-10-CM | POA: Diagnosis not present

## 2017-02-16 DIAGNOSIS — N2581 Secondary hyperparathyroidism of renal origin: Secondary | ICD-10-CM | POA: Diagnosis not present

## 2017-02-16 DIAGNOSIS — D509 Iron deficiency anemia, unspecified: Secondary | ICD-10-CM | POA: Diagnosis not present

## 2017-02-16 DIAGNOSIS — E1029 Type 1 diabetes mellitus with other diabetic kidney complication: Secondary | ICD-10-CM | POA: Diagnosis not present

## 2017-02-19 DIAGNOSIS — D509 Iron deficiency anemia, unspecified: Secondary | ICD-10-CM | POA: Diagnosis not present

## 2017-02-19 DIAGNOSIS — D631 Anemia in chronic kidney disease: Secondary | ICD-10-CM | POA: Diagnosis not present

## 2017-02-19 DIAGNOSIS — N2581 Secondary hyperparathyroidism of renal origin: Secondary | ICD-10-CM | POA: Diagnosis not present

## 2017-02-19 DIAGNOSIS — E1029 Type 1 diabetes mellitus with other diabetic kidney complication: Secondary | ICD-10-CM | POA: Diagnosis not present

## 2017-02-19 DIAGNOSIS — N186 End stage renal disease: Secondary | ICD-10-CM | POA: Diagnosis not present

## 2017-02-21 DIAGNOSIS — E1029 Type 1 diabetes mellitus with other diabetic kidney complication: Secondary | ICD-10-CM | POA: Diagnosis not present

## 2017-02-21 DIAGNOSIS — D631 Anemia in chronic kidney disease: Secondary | ICD-10-CM | POA: Diagnosis not present

## 2017-02-21 DIAGNOSIS — N186 End stage renal disease: Secondary | ICD-10-CM | POA: Diagnosis not present

## 2017-02-21 DIAGNOSIS — D509 Iron deficiency anemia, unspecified: Secondary | ICD-10-CM | POA: Diagnosis not present

## 2017-02-21 DIAGNOSIS — N2581 Secondary hyperparathyroidism of renal origin: Secondary | ICD-10-CM | POA: Diagnosis not present

## 2017-02-22 ENCOUNTER — Ambulatory Visit (HOSPITAL_COMMUNITY): Payer: Medicare Other | Attending: Cardiovascular Disease

## 2017-02-22 ENCOUNTER — Other Ambulatory Visit: Payer: Self-pay

## 2017-02-22 DIAGNOSIS — I340 Nonrheumatic mitral (valve) insufficiency: Secondary | ICD-10-CM | POA: Diagnosis not present

## 2017-02-22 DIAGNOSIS — R0789 Other chest pain: Secondary | ICD-10-CM | POA: Diagnosis not present

## 2017-02-22 DIAGNOSIS — R079 Chest pain, unspecified: Secondary | ICD-10-CM

## 2017-02-23 DIAGNOSIS — N186 End stage renal disease: Secondary | ICD-10-CM | POA: Diagnosis not present

## 2017-02-23 DIAGNOSIS — D631 Anemia in chronic kidney disease: Secondary | ICD-10-CM | POA: Diagnosis not present

## 2017-02-23 DIAGNOSIS — N2581 Secondary hyperparathyroidism of renal origin: Secondary | ICD-10-CM | POA: Diagnosis not present

## 2017-02-23 DIAGNOSIS — E1029 Type 1 diabetes mellitus with other diabetic kidney complication: Secondary | ICD-10-CM | POA: Diagnosis not present

## 2017-02-23 DIAGNOSIS — D509 Iron deficiency anemia, unspecified: Secondary | ICD-10-CM | POA: Diagnosis not present

## 2017-02-26 DIAGNOSIS — N186 End stage renal disease: Secondary | ICD-10-CM | POA: Diagnosis not present

## 2017-02-26 DIAGNOSIS — N2581 Secondary hyperparathyroidism of renal origin: Secondary | ICD-10-CM | POA: Diagnosis not present

## 2017-02-26 DIAGNOSIS — D631 Anemia in chronic kidney disease: Secondary | ICD-10-CM | POA: Diagnosis not present

## 2017-02-26 DIAGNOSIS — E1029 Type 1 diabetes mellitus with other diabetic kidney complication: Secondary | ICD-10-CM | POA: Diagnosis not present

## 2017-02-26 DIAGNOSIS — D509 Iron deficiency anemia, unspecified: Secondary | ICD-10-CM | POA: Diagnosis not present

## 2017-02-28 ENCOUNTER — Emergency Department (HOSPITAL_COMMUNITY): Payer: Medicare Other

## 2017-02-28 ENCOUNTER — Emergency Department (HOSPITAL_COMMUNITY)
Admission: EM | Admit: 2017-02-28 | Discharge: 2017-02-28 | Disposition: A | Discharge status: Home / Self Care | Payer: Medicare Other | Attending: Emergency Medicine | Admitting: Emergency Medicine

## 2017-02-28 ENCOUNTER — Encounter (HOSPITAL_COMMUNITY): Payer: Self-pay

## 2017-02-28 DIAGNOSIS — Z79899 Other long term (current) drug therapy: Secondary | ICD-10-CM | POA: Diagnosis not present

## 2017-02-28 DIAGNOSIS — E1029 Type 1 diabetes mellitus with other diabetic kidney complication: Secondary | ICD-10-CM | POA: Diagnosis not present

## 2017-02-28 DIAGNOSIS — Y33XXXA Other specified events, undetermined intent, initial encounter: Secondary | ICD-10-CM | POA: Insufficient documentation

## 2017-02-28 DIAGNOSIS — D509 Iron deficiency anemia, unspecified: Secondary | ICD-10-CM | POA: Diagnosis not present

## 2017-02-28 DIAGNOSIS — Z992 Dependence on renal dialysis: Secondary | ICD-10-CM | POA: Insufficient documentation

## 2017-02-28 DIAGNOSIS — S92511A Displaced fracture of proximal phalanx of right lesser toe(s), initial encounter for closed fracture: Secondary | ICD-10-CM

## 2017-02-28 DIAGNOSIS — D631 Anemia in chronic kidney disease: Secondary | ICD-10-CM | POA: Diagnosis not present

## 2017-02-28 DIAGNOSIS — I110 Hypertensive heart disease with heart failure: Secondary | ICD-10-CM | POA: Insufficient documentation

## 2017-02-28 DIAGNOSIS — N2581 Secondary hyperparathyroidism of renal origin: Secondary | ICD-10-CM | POA: Diagnosis not present

## 2017-02-28 DIAGNOSIS — I5032 Chronic diastolic (congestive) heart failure: Secondary | ICD-10-CM | POA: Diagnosis not present

## 2017-02-28 DIAGNOSIS — N186 End stage renal disease: Secondary | ICD-10-CM | POA: Insufficient documentation

## 2017-02-28 DIAGNOSIS — S90121A Contusion of right lesser toe(s) without damage to nail, initial encounter: Secondary | ICD-10-CM | POA: Diagnosis present

## 2017-02-28 DIAGNOSIS — I120 Hypertensive chronic kidney disease with stage 5 chronic kidney disease or end stage renal disease: Secondary | ICD-10-CM | POA: Diagnosis not present

## 2017-02-28 DIAGNOSIS — Y939 Activity, unspecified: Secondary | ICD-10-CM | POA: Insufficient documentation

## 2017-02-28 DIAGNOSIS — Y929 Unspecified place or not applicable: Secondary | ICD-10-CM | POA: Diagnosis not present

## 2017-02-28 DIAGNOSIS — E119 Type 2 diabetes mellitus without complications: Secondary | ICD-10-CM | POA: Diagnosis not present

## 2017-02-28 DIAGNOSIS — Y998 Other external cause status: Secondary | ICD-10-CM | POA: Insufficient documentation

## 2017-02-28 NOTE — Progress Notes (Signed)
Orthopedic Tech Progress Note Patient Details:  [name] [birth year] [id number]  Ortho Devices Type of Ortho Device: Postop shoe/boot Ortho Device/Splint Location: RLE Ortho Device/Splint Interventions: Ordered, Application   Braulio Bosch 02/28/2017, 5:31 PM

## 2017-02-28 NOTE — Discharge Instructions (Signed)
Please read attached information regarding your condition. Wear postop boot as directed. Follow-up with podiatrist  (foot specialist) listed below for further evaluation. Return to ED for worsening pain, numbness, weakness, inability to walk, additional injury, fever or temperature change of extremities.
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
## 2017-02-28 NOTE — ED Triage Notes (Signed)
Per Pt, Pt is coming from home with complaints of bruising noted to the right foot that started yesterday. Pt has hx of dialysis. Denies injury. Circulation noted to be intact. Pulses present.

## 2017-03-02 DIAGNOSIS — D631 Anemia in chronic kidney disease: Secondary | ICD-10-CM | POA: Diagnosis not present

## 2017-03-02 DIAGNOSIS — D509 Iron deficiency anemia, unspecified: Secondary | ICD-10-CM | POA: Diagnosis not present

## 2017-03-02 DIAGNOSIS — N186 End stage renal disease: Secondary | ICD-10-CM | POA: Diagnosis not present

## 2017-03-02 DIAGNOSIS — N2581 Secondary hyperparathyroidism of renal origin: Secondary | ICD-10-CM | POA: Diagnosis not present

## 2017-03-02 DIAGNOSIS — Z992 Dependence on renal dialysis: Secondary | ICD-10-CM | POA: Diagnosis not present

## 2017-03-02 DIAGNOSIS — E1122 Type 2 diabetes mellitus with diabetic chronic kidney disease: Secondary | ICD-10-CM | POA: Diagnosis not present

## 2017-03-02 DIAGNOSIS — E1029 Type 1 diabetes mellitus with other diabetic kidney complication: Secondary | ICD-10-CM | POA: Diagnosis not present

## 2017-03-04 ENCOUNTER — Encounter: Payer: Self-pay | Admitting: Pediatric Intensive Care

## 2017-03-05 DIAGNOSIS — N186 End stage renal disease: Secondary | ICD-10-CM | POA: Diagnosis not present

## 2017-03-05 DIAGNOSIS — N2581 Secondary hyperparathyroidism of renal origin: Secondary | ICD-10-CM | POA: Diagnosis not present

## 2017-03-05 DIAGNOSIS — E1029 Type 1 diabetes mellitus with other diabetic kidney complication: Secondary | ICD-10-CM | POA: Diagnosis not present

## 2017-03-05 DIAGNOSIS — D631 Anemia in chronic kidney disease: Secondary | ICD-10-CM | POA: Diagnosis not present

## 2017-03-05 DIAGNOSIS — D509 Iron deficiency anemia, unspecified: Secondary | ICD-10-CM | POA: Diagnosis not present

## 2017-03-07 DIAGNOSIS — D509 Iron deficiency anemia, unspecified: Secondary | ICD-10-CM | POA: Diagnosis not present

## 2017-03-07 DIAGNOSIS — N186 End stage renal disease: Secondary | ICD-10-CM | POA: Diagnosis not present

## 2017-03-07 DIAGNOSIS — D631 Anemia in chronic kidney disease: Secondary | ICD-10-CM | POA: Diagnosis not present

## 2017-03-07 DIAGNOSIS — N2581 Secondary hyperparathyroidism of renal origin: Secondary | ICD-10-CM | POA: Diagnosis not present

## 2017-03-07 DIAGNOSIS — E1029 Type 1 diabetes mellitus with other diabetic kidney complication: Secondary | ICD-10-CM | POA: Diagnosis not present

## 2017-03-09 DIAGNOSIS — D631 Anemia in chronic kidney disease: Secondary | ICD-10-CM | POA: Diagnosis not present

## 2017-03-09 DIAGNOSIS — N186 End stage renal disease: Secondary | ICD-10-CM | POA: Diagnosis not present

## 2017-03-09 DIAGNOSIS — E1029 Type 1 diabetes mellitus with other diabetic kidney complication: Secondary | ICD-10-CM | POA: Diagnosis not present

## 2017-03-09 DIAGNOSIS — N2581 Secondary hyperparathyroidism of renal origin: Secondary | ICD-10-CM | POA: Diagnosis not present

## 2017-03-09 DIAGNOSIS — D509 Iron deficiency anemia, unspecified: Secondary | ICD-10-CM | POA: Diagnosis not present

## 2017-03-11 ENCOUNTER — Ambulatory Visit: Payer: Medicare Other

## 2017-03-11 ENCOUNTER — Ambulatory Visit (INDEPENDENT_AMBULATORY_CARE_PROVIDER_SITE_OTHER): Payer: Medicare Other | Admitting: Podiatry

## 2017-03-11 ENCOUNTER — Encounter: Payer: Self-pay | Admitting: *Deleted

## 2017-03-11 ENCOUNTER — Ambulatory Visit (INDEPENDENT_AMBULATORY_CARE_PROVIDER_SITE_OTHER): Payer: Medicare Other

## 2017-03-11 DIAGNOSIS — S92511A Displaced fracture of proximal phalanx of right lesser toe(s), initial encounter for closed fracture: Secondary | ICD-10-CM

## 2017-03-12 DIAGNOSIS — E1029 Type 1 diabetes mellitus with other diabetic kidney complication: Secondary | ICD-10-CM | POA: Diagnosis not present

## 2017-03-12 DIAGNOSIS — D509 Iron deficiency anemia, unspecified: Secondary | ICD-10-CM | POA: Diagnosis not present

## 2017-03-12 DIAGNOSIS — N186 End stage renal disease: Secondary | ICD-10-CM | POA: Diagnosis not present

## 2017-03-12 DIAGNOSIS — D631 Anemia in chronic kidney disease: Secondary | ICD-10-CM | POA: Diagnosis not present

## 2017-03-12 DIAGNOSIS — N2581 Secondary hyperparathyroidism of renal origin: Secondary | ICD-10-CM | POA: Diagnosis not present

## 2017-03-14 DIAGNOSIS — N186 End stage renal disease: Secondary | ICD-10-CM | POA: Diagnosis not present

## 2017-03-14 DIAGNOSIS — N2581 Secondary hyperparathyroidism of renal origin: Secondary | ICD-10-CM | POA: Diagnosis not present

## 2017-03-14 DIAGNOSIS — D509 Iron deficiency anemia, unspecified: Secondary | ICD-10-CM | POA: Diagnosis not present

## 2017-03-14 DIAGNOSIS — E1029 Type 1 diabetes mellitus with other diabetic kidney complication: Secondary | ICD-10-CM | POA: Diagnosis not present

## 2017-03-14 DIAGNOSIS — D631 Anemia in chronic kidney disease: Secondary | ICD-10-CM | POA: Diagnosis not present

## 2017-03-18 DIAGNOSIS — I871 Compression of vein: Secondary | ICD-10-CM | POA: Diagnosis not present

## 2017-03-18 DIAGNOSIS — Z992 Dependence on renal dialysis: Secondary | ICD-10-CM | POA: Diagnosis not present

## 2017-03-18 DIAGNOSIS — N186 End stage renal disease: Secondary | ICD-10-CM | POA: Diagnosis not present

## 2017-03-18 DIAGNOSIS — T82868A Thrombosis of vascular prosthetic devices, implants and grafts, initial encounter: Secondary | ICD-10-CM | POA: Diagnosis not present

## 2017-03-19 DIAGNOSIS — N2581 Secondary hyperparathyroidism of renal origin: Secondary | ICD-10-CM | POA: Diagnosis not present

## 2017-03-19 DIAGNOSIS — E1029 Type 1 diabetes mellitus with other diabetic kidney complication: Secondary | ICD-10-CM | POA: Diagnosis not present

## 2017-03-19 DIAGNOSIS — N186 End stage renal disease: Secondary | ICD-10-CM | POA: Diagnosis not present

## 2017-03-19 DIAGNOSIS — D631 Anemia in chronic kidney disease: Secondary | ICD-10-CM | POA: Diagnosis not present

## 2017-03-19 DIAGNOSIS — D509 Iron deficiency anemia, unspecified: Secondary | ICD-10-CM | POA: Diagnosis not present

## 2017-03-21 DIAGNOSIS — D631 Anemia in chronic kidney disease: Secondary | ICD-10-CM | POA: Diagnosis not present

## 2017-03-21 DIAGNOSIS — N2581 Secondary hyperparathyroidism of renal origin: Secondary | ICD-10-CM | POA: Diagnosis not present

## 2017-03-21 DIAGNOSIS — N186 End stage renal disease: Secondary | ICD-10-CM | POA: Diagnosis not present

## 2017-03-21 DIAGNOSIS — E1029 Type 1 diabetes mellitus with other diabetic kidney complication: Secondary | ICD-10-CM | POA: Diagnosis not present

## 2017-03-21 DIAGNOSIS — D509 Iron deficiency anemia, unspecified: Secondary | ICD-10-CM | POA: Diagnosis not present

## 2017-03-21 NOTE — Addendum Note (Signed)
Addended by: Lianne Cure A on: 03/21/2017 11:19 AM   Modules accepted: Orders

## 2017-03-23 DIAGNOSIS — D509 Iron deficiency anemia, unspecified: Secondary | ICD-10-CM | POA: Diagnosis not present

## 2017-03-23 DIAGNOSIS — E1029 Type 1 diabetes mellitus with other diabetic kidney complication: Secondary | ICD-10-CM | POA: Diagnosis not present

## 2017-03-23 DIAGNOSIS — N186 End stage renal disease: Secondary | ICD-10-CM | POA: Diagnosis not present

## 2017-03-23 DIAGNOSIS — D631 Anemia in chronic kidney disease: Secondary | ICD-10-CM | POA: Diagnosis not present

## 2017-03-23 DIAGNOSIS — N2581 Secondary hyperparathyroidism of renal origin: Secondary | ICD-10-CM | POA: Diagnosis not present

## 2017-03-25 DIAGNOSIS — N5201 Erectile dysfunction due to arterial insufficiency: Secondary | ICD-10-CM | POA: Diagnosis not present

## 2017-03-26 ENCOUNTER — Encounter: Payer: Self-pay | Admitting: Surgery

## 2017-03-26 DIAGNOSIS — N2581 Secondary hyperparathyroidism of renal origin: Secondary | ICD-10-CM | POA: Diagnosis not present

## 2017-03-26 DIAGNOSIS — N186 End stage renal disease: Secondary | ICD-10-CM | POA: Diagnosis not present

## 2017-03-26 DIAGNOSIS — D509 Iron deficiency anemia, unspecified: Secondary | ICD-10-CM | POA: Diagnosis not present

## 2017-03-26 DIAGNOSIS — D631 Anemia in chronic kidney disease: Secondary | ICD-10-CM | POA: Diagnosis not present

## 2017-03-26 DIAGNOSIS — E1029 Type 1 diabetes mellitus with other diabetic kidney complication: Secondary | ICD-10-CM | POA: Diagnosis not present

## 2017-03-28 DIAGNOSIS — N186 End stage renal disease: Secondary | ICD-10-CM | POA: Diagnosis not present

## 2017-03-28 DIAGNOSIS — D509 Iron deficiency anemia, unspecified: Secondary | ICD-10-CM | POA: Diagnosis not present

## 2017-03-28 DIAGNOSIS — D631 Anemia in chronic kidney disease: Secondary | ICD-10-CM | POA: Diagnosis not present

## 2017-03-28 DIAGNOSIS — E1029 Type 1 diabetes mellitus with other diabetic kidney complication: Secondary | ICD-10-CM | POA: Diagnosis not present

## 2017-03-28 DIAGNOSIS — N2581 Secondary hyperparathyroidism of renal origin: Secondary | ICD-10-CM | POA: Diagnosis not present

## 2017-03-29 NOTE — Progress Notes (Signed)
   HPI: 56 year old male presents the office today for evaluation of a fracture which was diagnosed to the emergency department on 02/28/2017. Patient does not recall injury but he states he he did experience pain and bruising. Patient presents today for follow-up treatment and evaluation. He has been wearing a surgical shoe.   Physical Exam: General: The patient is alert and oriented x3 in no acute distress.  Dermatology: Skin is warm, dry and supple bilateral lower extremities. Negative for open lesions or macerations.  Vascular: Palpable pedal pulses bilaterally. No erythema noted. Capillary refill within normal limits.  Neurological: Epicritic and protective threshold grossly intact bilaterally.   Musculoskeletal Exam: Pain on palpation to the fourth digit right foot with moderate edema noted. Range of motion within normal limits to all pedal and ankle joints bilateral. Muscle strength 5/5 in all groups bilateral.   Radiographic Exam:  Subtle chip avulsion fracture of the head of the proximal phalanx fourth digit right foot. Nondisplaced. Closed.  Assessment: 1. Proximal phalanx fracture. Digit right foot   Plan of Care:  1. Patient was evaluated. X-rays reviewed today 2. Recommend the patient continue wearing the postoperative shoe. Today a new postoperative shoe was dispensed. 3. Recommend that the patient wear the postoperative shoe for an additional 6 weeks to allow for healing. 4. Return to clinic in 6 weeks for follow-up x-ray   Edrick Kins, DPM [facility]  Dr. Edrick Kins, DPM    [address], [place] [address]                Office [phone]  Fax [phone]

## 2017-03-29 NOTE — Congregational Nurse Program (Signed)
Congregational Nurse Program Note  Date of Encounter: 03/04/2017  Past Medical History: Past Medical History:  Diagnosis Date  . Anemia   . Cellulitis and abscess of foot 08/24/2011  . Cocaine abuse 2016  . Diastolic congestive heart failure ([place])   . ESRD on dialysis ([place])    "TTS; [address]" (10/29/2016)  . GERD (gastroesophageal reflux disease)    unsure  . HCVD (hypertensive cardiovascular disease) 09/2016   Echo shows moderate to severe LAE, grade 1 DD  . Hypertension 2015  . Pneumonia 10/2016  . Protein calorie malnutrition ([place])   . PVD (peripheral vascular disease) ([place])   . Type II diabetes mellitus ([place]) 2015    Encounter Details:     CNP Questionnaire - 03/04/17 0945      Patient Demographics   Is this a new or existing patient? New   Patient is considered a/an Not Applicable   Race Latino/Hispanic     Patient Assistance   Location of Patient Assistance [facility]   Patient's financial/insurance status Medicaid;Low Income   Uninsured Patient ([place] Onc[facility]) No   Patient referred to apply for the following financial assistance Not Applicable   Food insecurities addressed Not Applicable   Transportation assistance No   Assistance securing medications No   Educational Programmer, multimedia the healthcare system     Encounter Details   Primary purpose of visit Chronic Illness/Condition Visit;Navigating the Healthcare System   Was an Emergency Department visit averted? Not Applicable   Does patient have a medical provider? Yes   Patient referred to Other   Was a mental health screening completed? (GAINS tool) No   Does patient have dental issues? No   Does patient have vision issues? No   Does your patient have an abnormal blood pressure today? No   Since previous encounter, have you referred patient for abnormal blood pressure that resulted in a new diagnosis or medication change? No   Does your patient have an abnormal blood glucose today?  No   Since previous encounter, have you referred patient for abnormal blood glucose that resulted in a new diagnosis or medication change? No   Was there a life-saving intervention made? No    Client requests bedrest during the day due to fatigue after dialysis treatment. States that some shelter staff are letting him rest, others are not. CN explained bedrest protocol and advised speaking to Hansel Feinstein for further guidance.

## 2017-03-30 DIAGNOSIS — D509 Iron deficiency anemia, unspecified: Secondary | ICD-10-CM | POA: Diagnosis not present

## 2017-03-30 DIAGNOSIS — N2581 Secondary hyperparathyroidism of renal origin: Secondary | ICD-10-CM | POA: Diagnosis not present

## 2017-03-30 DIAGNOSIS — D631 Anemia in chronic kidney disease: Secondary | ICD-10-CM | POA: Diagnosis not present

## 2017-03-30 DIAGNOSIS — E1029 Type 1 diabetes mellitus with other diabetic kidney complication: Secondary | ICD-10-CM | POA: Diagnosis not present

## 2017-03-30 DIAGNOSIS — N186 End stage renal disease: Secondary | ICD-10-CM | POA: Diagnosis not present

## 2017-04-02 ENCOUNTER — Observation Stay (HOSPITAL_BASED_OUTPATIENT_CLINIC_OR_DEPARTMENT_OTHER)
Admit: 2017-04-02 | Discharge: 2017-04-02 | Disposition: A | Discharge status: Home / Self Care | Payer: Medicare Other | Attending: Internal Medicine | Admitting: Internal Medicine

## 2017-04-02 ENCOUNTER — Emergency Department (HOSPITAL_COMMUNITY): Payer: Medicare Other

## 2017-04-02 ENCOUNTER — Inpatient Hospital Stay (HOSPITAL_COMMUNITY)
Admission: EM | Admit: 2017-04-02 | Discharge: 2017-04-11 | DRG: 175 | Disposition: A | Discharge status: Home / Self Care | Payer: Medicare Other | Attending: Internal Medicine | Admitting: Internal Medicine

## 2017-04-02 ENCOUNTER — Encounter (HOSPITAL_COMMUNITY): Payer: Self-pay

## 2017-04-02 DIAGNOSIS — I2699 Other pulmonary embolism without acute cor pulmonale: Secondary | ICD-10-CM

## 2017-04-02 DIAGNOSIS — Z791 Long term (current) use of non-steroidal anti-inflammatories (NSAID): Secondary | ICD-10-CM

## 2017-04-02 DIAGNOSIS — IMO0001 Reserved for inherently not codable concepts without codable children: Secondary | ICD-10-CM | POA: Diagnosis present

## 2017-04-02 DIAGNOSIS — Y828 Other medical devices associated with adverse incidents: Secondary | ICD-10-CM | POA: Diagnosis present

## 2017-04-02 DIAGNOSIS — I120 Hypertensive chronic kidney disease with stage 5 chronic kidney disease or end stage renal disease: Secondary | ICD-10-CM | POA: Diagnosis not present

## 2017-04-02 DIAGNOSIS — I5032 Chronic diastolic (congestive) heart failure: Secondary | ICD-10-CM | POA: Diagnosis not present

## 2017-04-02 DIAGNOSIS — I10 Essential (primary) hypertension: Secondary | ICD-10-CM

## 2017-04-02 DIAGNOSIS — D631 Anemia in chronic kidney disease: Secondary | ICD-10-CM | POA: Diagnosis present

## 2017-04-02 DIAGNOSIS — I2690 Septic pulmonary embolism without acute cor pulmonale: Secondary | ICD-10-CM

## 2017-04-02 DIAGNOSIS — E1151 Type 2 diabetes mellitus with diabetic peripheral angiopathy without gangrene: Secondary | ICD-10-CM | POA: Diagnosis present

## 2017-04-02 DIAGNOSIS — N2581 Secondary hyperparathyroidism of renal origin: Secondary | ICD-10-CM | POA: Diagnosis present

## 2017-04-02 DIAGNOSIS — E1121 Type 2 diabetes mellitus with diabetic nephropathy: Secondary | ICD-10-CM

## 2017-04-02 DIAGNOSIS — R0602 Shortness of breath: Secondary | ICD-10-CM

## 2017-04-02 DIAGNOSIS — T82590A Other mechanical complication of surgically created arteriovenous fistula, initial encounter: Secondary | ICD-10-CM | POA: Diagnosis not present

## 2017-04-02 DIAGNOSIS — N186 End stage renal disease: Secondary | ICD-10-CM | POA: Diagnosis not present

## 2017-04-02 DIAGNOSIS — E1122 Type 2 diabetes mellitus with diabetic chronic kidney disease: Secondary | ICD-10-CM | POA: Diagnosis not present

## 2017-04-02 DIAGNOSIS — Z992 Dependence on renal dialysis: Secondary | ICD-10-CM | POA: Diagnosis not present

## 2017-04-02 DIAGNOSIS — I871 Compression of vein: Secondary | ICD-10-CM

## 2017-04-02 DIAGNOSIS — R079 Chest pain, unspecified: Secondary | ICD-10-CM | POA: Diagnosis not present

## 2017-04-02 DIAGNOSIS — Z794 Long term (current) use of insulin: Secondary | ICD-10-CM

## 2017-04-02 DIAGNOSIS — I132 Hypertensive heart and chronic kidney disease with heart failure and with stage 5 chronic kidney disease, or end stage renal disease: Secondary | ICD-10-CM | POA: Diagnosis present

## 2017-04-02 DIAGNOSIS — I5189 Other ill-defined heart diseases: Secondary | ICD-10-CM | POA: Diagnosis present

## 2017-04-02 DIAGNOSIS — Z590 Homelessness: Secondary | ICD-10-CM

## 2017-04-02 DIAGNOSIS — E1129 Type 2 diabetes mellitus with other diabetic kidney complication: Secondary | ICD-10-CM

## 2017-04-02 DIAGNOSIS — K219 Gastro-esophageal reflux disease without esophagitis: Secondary | ICD-10-CM | POA: Diagnosis present

## 2017-04-02 HISTORY — DX: Other ill-defined heart diseases: I51.89

## 2017-04-02 LAB — PROTIME-INR
INR: 1.06
PROTHROMBIN TIME: 13.8 s (ref 11.4–15.2)

## 2017-04-02 LAB — COMPREHENSIVE METABOLIC PANEL
ALK PHOS: 89 U/L (ref 38–126)
ALT: 17 U/L (ref 17–63)
ANION GAP: 13 (ref 5–15)
AST: 19 U/L (ref 15–41)
Albumin: 3.5 g/dL (ref 3.5–5.0)
BILIRUBIN TOTAL: 0.7 mg/dL (ref 0.3–1.2)
BUN: 60 mg/dL — ABNORMAL HIGH (ref 6–20)
CALCIUM: 9.3 mg/dL (ref 8.9–10.3)
CO2: 20 mmol/L — ABNORMAL LOW (ref 22–32)
CREATININE: 8.78 mg/dL — AB (ref 0.61–1.24)
Chloride: 100 mmol/L — ABNORMAL LOW (ref 101–111)
GFR, EST AFRICAN AMERICAN: 7 mL/min — AB (ref >60)
GFR, EST NON AFRICAN AMERICAN: 6 mL/min — AB (ref >60)
Glucose, Bld: 159 mg/dL — ABNORMAL HIGH (ref 65–99)
Potassium: 4.2 mmol/L (ref 3.5–5.1)
Sodium: 133 mmol/L — ABNORMAL LOW (ref 135–145)
TOTAL PROTEIN: 7.3 g/dL (ref 6.5–8.1)

## 2017-04-02 LAB — CBC WITH DIFFERENTIAL/PLATELET
BASOS ABS: 0.1 10*3/uL (ref 0.0–0.1)
BASOS ABS: 0.1 10*3/uL (ref 0.0–0.1)
BASOS PCT: 1 %
Basophils Relative: 1 %
EOS ABS: 0.6 10*3/uL (ref 0.0–0.7)
Eosinophils Absolute: 0.5 10*3/uL (ref 0.0–0.7)
Eosinophils Relative: 4 %
Eosinophils Relative: 4 %
HEMATOCRIT: 29.8 % — AB (ref 39.0–52.0)
HEMATOCRIT: 28.7 % — AB (ref 39.0–52.0)
HEMOGLOBIN: 9.8 g/dL — AB (ref 13.0–17.0)
Hemoglobin: 10.2 g/dL — ABNORMAL LOW (ref 13.0–17.0)
LYMPHS PCT: 24 %
Lymphocytes Relative: 23 %
Lymphs Abs: 2.9 10*3/uL (ref 0.7–4.0)
Lymphs Abs: 3.3 10*3/uL (ref 0.7–4.0)
MCH: 32.1 pg (ref 26.0–34.0)
MCH: 32 pg (ref 26.0–34.0)
MCHC: 34.2 g/dL (ref 30.0–36.0)
MCHC: 34.1 g/dL (ref 30.0–36.0)
MCV: 93.7 fL (ref 78.0–100.0)
MCV: 93.8 fL (ref 78.0–100.0)
Monocytes Absolute: 0.9 10*3/uL (ref 0.1–1.0)
Monocytes Absolute: 1.1 10*3/uL — ABNORMAL HIGH (ref 0.1–1.0)
Monocytes Relative: 7 %
Monocytes Relative: 7 %
NEUTROS ABS: 8 10*3/uL — AB (ref 1.7–7.7)
NEUTROS ABS: 9.4 10*3/uL — AB (ref 1.7–7.7)
NEUTROS PCT: 64 %
NEUTROS PCT: 65 %
PLATELETS: 309 10*3/uL (ref 150–400)
Platelets: 285 10*3/uL (ref 150–400)
RBC: 3.18 MIL/uL — AB (ref 4.22–5.81)
RBC: 3.06 MIL/uL — ABNORMAL LOW (ref 4.22–5.81)
RDW: 15.4 % (ref 11.5–15.5)
RDW: 15.6 % — AB (ref 11.5–15.5)
WBC: 12.3 10*3/uL — AB (ref 4.0–10.5)
WBC: 14.4 10*3/uL — ABNORMAL HIGH (ref 4.0–10.5)

## 2017-04-02 LAB — I-STAT TROPONIN, ED: TROPONIN I, POC: 0.18 ng/mL — AB (ref 0.00–0.08)

## 2017-04-02 LAB — I-STAT CHEM 8, ED
BUN: 57 mg/dL — ABNORMAL HIGH (ref 6–20)
CHLORIDE: 101 mmol/L (ref 101–111)
CREATININE: 9.5 mg/dL — AB (ref 0.61–1.24)
Calcium, Ion: 1.19 mmol/L (ref 1.15–1.40)
GLUCOSE: 216 mg/dL — AB (ref 65–99)
HCT: 29 % — ABNORMAL LOW (ref 39.0–52.0)
Hemoglobin: 9.9 g/dL — ABNORMAL LOW (ref 13.0–17.0)
POTASSIUM: 4 mmol/L (ref 3.5–5.1)
Sodium: 135 mmol/L (ref 135–145)
TCO2: 23 mmol/L (ref 0–100)

## 2017-04-02 LAB — MRSA PCR SCREENING: MRSA BY PCR: NEGATIVE

## 2017-04-02 LAB — BRAIN NATRIURETIC PEPTIDE: B NATRIURETIC PEPTIDE 5: 324.6 pg/mL — AB (ref 0.0–100.0)

## 2017-04-02 LAB — TROPONIN I
TROPONIN I: 0.19 ng/mL — AB (ref <0.03)
Troponin I: 0.13 ng/mL (ref <0.03)
Troponin I: 0.13 ng/mL (ref <0.03)

## 2017-04-02 LAB — GLUCOSE, CAPILLARY: Glucose-Capillary: 138 mg/dL — ABNORMAL HIGH (ref 65–99)

## 2017-04-02 LAB — HEPARIN LEVEL (UNFRACTIONATED): HEPARIN UNFRACTIONATED: 0.65 [IU]/mL (ref 0.30–0.70)

## 2017-04-02 LAB — CBG MONITORING, ED
GLUCOSE-CAPILLARY: 173 mg/dL — AB (ref 65–99)
Glucose-Capillary: 159 mg/dL — ABNORMAL HIGH (ref 65–99)

## 2017-04-02 MED ORDER — SODIUM CHLORIDE 0.9 % IV SOLN: 100.0000 mL | INTRAVENOUS | Status: DC | PRN

## 2017-04-02 MED ORDER — CINACALCET HCL 30 MG PO TABS
60.0000 mg | ORAL_TABLET | Freq: Every day | ORAL | Status: DC
  Administered 2017-04-02 – 2017-04-11 (×7): 60 mg via ORAL
  Filled 2017-04-02 (×8): qty 2

## 2017-04-02 MED ORDER — IOPAMIDOL (ISOVUE-370) INJECTION 76%
INTRAVENOUS | Status: AC
  Filled 2017-04-02: qty 100

## 2017-04-02 MED ORDER — ACETAMINOPHEN 325 MG PO TABS
650.0000 mg | ORAL_TABLET | Freq: Four times a day (QID) | ORAL | Status: DC | PRN
  Administered 2017-04-05 – 2017-04-11 (×5): 650 mg via ORAL
  Filled 2017-04-02 (×5): qty 2

## 2017-04-02 MED ORDER — WARFARIN SODIUM 7.5 MG PO TABS
7.5000 mg | ORAL_TABLET | Freq: Once | ORAL | Status: DC
  Filled 2017-04-02: qty 1

## 2017-04-02 MED ORDER — WARFARIN - PHARMACIST DOSING INPATIENT: Freq: Every day | Status: DC

## 2017-04-02 MED ORDER — ONDANSETRON HCL 4 MG PO TABS
4.0000 mg | ORAL_TABLET | Freq: Four times a day (QID) | ORAL | Status: DC | PRN
  Administered 2017-04-07: 4 mg via ORAL
  Filled 2017-04-02: qty 1

## 2017-04-02 MED ORDER — ONDANSETRON HCL 4 MG/2ML IJ SOLN
4.0000 mg | Freq: Four times a day (QID) | INTRAMUSCULAR | Status: DC | PRN
  Administered 2017-04-05 – 2017-04-10 (×2): 4 mg via INTRAVENOUS
  Filled 2017-04-02 (×3): qty 2

## 2017-04-02 MED ORDER — INSULIN ASPART 100 UNIT/ML ~~LOC~~ SOLN
0.0000 [IU] | Freq: Three times a day (TID) | SUBCUTANEOUS | Status: DC
  Administered 2017-04-02 (×2): 2 [IU] via SUBCUTANEOUS
  Administered 2017-04-03: 3 [IU] via SUBCUTANEOUS
  Administered 2017-04-03: 2 [IU] via SUBCUTANEOUS
  Administered 2017-04-04: 3 [IU] via SUBCUTANEOUS
  Administered 2017-04-05: 1 [IU] via SUBCUTANEOUS
  Administered 2017-04-05 – 2017-04-06 (×3): 2 [IU] via SUBCUTANEOUS
  Administered 2017-04-06 – 2017-04-07 (×2): 1 [IU] via SUBCUTANEOUS
  Administered 2017-04-07: 2 [IU] via SUBCUTANEOUS
  Administered 2017-04-07: 1 [IU] via SUBCUTANEOUS
  Administered 2017-04-08: 3 [IU] via SUBCUTANEOUS
  Administered 2017-04-09 – 2017-04-10 (×3): 2 [IU] via SUBCUTANEOUS
  Administered 2017-04-11: 1 [IU] via SUBCUTANEOUS
  Filled 2017-04-02 (×2): qty 1

## 2017-04-02 MED ORDER — HEPARIN (PORCINE) IN NACL 100-0.45 UNIT/ML-% IJ SOLN
1250.0000 [IU]/h | INTRAMUSCULAR | Status: DC
  Administered 2017-04-02 (×2): 1400 [IU]/h via INTRAVENOUS
  Administered 2017-04-03 – 2017-04-05 (×3): 1250 [IU]/h via INTRAVENOUS
  Filled 2017-04-02 (×6): qty 250

## 2017-04-02 MED ORDER — IOPAMIDOL (ISOVUE-370) INJECTION 76%
INTRAVENOUS | Status: AC
  Administered 2017-04-02: 100 mL
  Filled 2017-04-02: qty 50

## 2017-04-02 MED ORDER — LIDOCAINE HCL (PF) 1 % IJ SOLN: 5.0000 mL | INTRAMUSCULAR | Status: DC | PRN

## 2017-04-02 MED ORDER — ASPIRIN 81 MG PO CHEW
324.0000 mg | CHEWABLE_TABLET | Freq: Once | ORAL | Status: AC
  Administered 2017-04-02: 324 mg via ORAL
  Filled 2017-04-02: qty 4

## 2017-04-02 MED ORDER — ALTEPLASE 2 MG IJ SOLR: 2.0000 mg | Freq: Once | INTRAMUSCULAR | Status: DC | PRN

## 2017-04-02 MED ORDER — PENTAFLUOROPROP-TETRAFLUOROETH EX AERO: 1.0000 "application " | INHALATION_SPRAY | CUTANEOUS | Status: DC | PRN

## 2017-04-02 MED ORDER — AMLODIPINE BESYLATE 5 MG PO TABS
5.0000 mg | ORAL_TABLET | Freq: Every day | ORAL | Status: DC
  Administered 2017-04-02 – 2017-04-11 (×10): 5 mg via ORAL
  Filled 2017-04-02 (×10): qty 1

## 2017-04-02 MED ORDER — HEPARIN BOLUS VIA INFUSION
4000.0000 [IU] | Freq: Once | INTRAVENOUS | Status: AC
  Administered 2017-04-02: 4000 [IU] via INTRAVENOUS
  Filled 2017-04-02: qty 4000

## 2017-04-02 MED ORDER — LIDOCAINE-PRILOCAINE 2.5-2.5 % EX CREA: 1.0000 "application " | TOPICAL_CREAM | CUTANEOUS | Status: DC | PRN

## 2017-04-02 MED ORDER — MELOXICAM 7.5 MG PO TABS
7.5000 mg | ORAL_TABLET | Freq: Once | ORAL | Status: AC
  Administered 2017-04-02: 7.5 mg via ORAL
  Filled 2017-04-02: qty 1

## 2017-04-02 MED ORDER — HEPARIN SODIUM (PORCINE) 1000 UNIT/ML DIALYSIS: 1000.0000 [IU] | INTRAMUSCULAR | Status: DC | PRN

## 2017-04-02 MED ORDER — LANTHANUM CARBONATE 500 MG PO CHEW
1000.0000 mg | CHEWABLE_TABLET | Freq: Three times a day (TID) | ORAL | Status: DC
  Administered 2017-04-02 – 2017-04-11 (×24): 1000 mg via ORAL
  Filled 2017-04-02 (×27): qty 2

## 2017-04-02 MED ORDER — LOSARTAN POTASSIUM 25 MG PO TABS
25.0000 mg | ORAL_TABLET | Freq: Every day | ORAL | Status: DC
  Administered 2017-04-02 – 2017-04-11 (×10): 25 mg via ORAL
  Filled 2017-04-02 (×10): qty 1

## 2017-04-02 MED ORDER — ACETAMINOPHEN 650 MG RE SUPP: 650.0000 mg | Freq: Four times a day (QID) | RECTAL | Status: DC | PRN

## 2017-04-02 NOTE — Care Management Obs Status (Signed)
[facility] NOTIFICATION   Patient Details  Name: [name] MRN: [id number] Date of Birth: 1961/07/02   Medicare Observation Status Notification Given:  Yes    Corky Crafts, RN 04/02/2017, 1:23 PM

## 2017-04-02 NOTE — ED Triage Notes (Signed)
Patient here for chest pain that is center of the chest non radiating.  Patient has dialysis tue, thurs, Saturday.  Complains of also being dizzy with the chest pain.  Reproducible on palpation. No meds by EMS

## 2017-04-02 NOTE — ED Notes (Signed)
[name] inHD now states they are ready for PY.

## 2017-04-02 NOTE — ED Notes (Signed)
Dr, Laurel Dimmer made aware of current troponin.

## 2017-04-02 NOTE — Progress Notes (Signed)
[place] for heparin + warfarin Indication: pulmonary embolus  Allergies  Allergen Reactions  . No Known Allergies     Patient Measurements: Height: 5\' 11"  (180.3 cm) Weight: 195 lb 1.7 oz (88.5 kg) IBW/kg (Calculated) : 75.3  Vital Signs: Temp: 98.4 F (36.9 C) (07/31 0229) Temp Source: Oral (07/31 0229) BP: 127/64 (07/31 0830) Pulse Rate: 85 (07/31 0830)  Labs:  Recent Labs  04/02/17 0200 04/02/17 0226 04/02/17 0624  HGB 10.2* 9.9* 9.8*  HCT 29.8* 29.0* 28.7*  PLT 309  --  285  CREATININE  --  9.50* 8.78*  TROPONINI  --   --  0.19*    Estimated Creatinine Clearance: 10.1 mL/min (A) (by C-G formula based on SCr of 8.78 mg/dL (H)).  Assessment: 56yo male c/o non-radiating CP associated w/ dizziness, CT reveals small bilateral PE without signs of RHS, and was started on IV heparin. Now planning to bridge to warfarin. Baseline INR is pending.   Goal of Therapy:  INR 2-3 Heparin level 0.3-0.7 units/ml Monitor platelets by anticoagulation protocol: Yes   Plan:  Warfarin 7.5mg  PO x 1 tonight Daily INR Continue heparin as ordered and follow-up PM heparin level  Salome Arnt, PharmD, BCPS 04/02/2017 8:34 AM

## 2017-04-02 NOTE — ED Notes (Signed)
HD states to hold medications.
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
## 2017-04-02 NOTE — Care Management Note (Signed)
Case Management Note  Patient Details  Name: [name] MRN: [id number] Date of Birth: 1961-06-07  Subjective/Objective: 56 y.o. male with history of ESRD on hemodialysis on Tuesday Thursday since Saturday presents to the ER with complaints of chest pain.  CM consulted for obs notification.  Pt is homeless but is currently staying at the [facility] shelter in [place].                 Action/Plan: Spoke with pt about Obs notice and verified pt had all the resources he needed.  Notified Care Coordinator, Opal Sidles, at [facility] that pt was being admitted in the hospital so they could see and/or make a follow up appointment for him with his PCP.  CM will need to continue to follow for D/C planning.  Consulted CSW since pt is homeless for any additional D/C needs.  Expected Discharge Date:                  Expected Discharge Plan:  Home/Self Care  In-House Referral:  Clinical Social Work  Discharge planning Services  CM Consult, [facility]  Status of Service:  In process, will continue to follow  Corky Crafts, RN 04/02/2017, 1:28 PM
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
## 2017-04-02 NOTE — ED Notes (Signed)
Patient returned from CT

## 2017-04-02 NOTE — Progress Notes (Signed)
ANTICOAGULATION CONSULT NOTE - Initial Consult  Pharmacy Consult for heparin Indication: pulmonary embolus  Allergies  Allergen Reactions  . No Known Allergies     Patient Measurements: Height: 5\' 11"  (180.3 cm) Weight: 195 lb 1.7 oz (88.5 kg) IBW/kg (Calculated) : 75.3  Vital Signs: Temp: 98.4 F (36.9 C) (07/31 0229) Temp Source: Oral (07/31 0229) BP: 144/82 (07/31 0330) Pulse Rate: 94 (07/31 0141)  Labs:  Recent Labs  04/02/17 0200 04/02/17 0226  HGB 10.2* 9.9*  HCT 29.8* 29.0*  PLT 309  --   CREATININE  --  9.50*    Estimated Creatinine Clearance: 9.4 mL/min (A) (by C-G formula based on SCr of 9.5 mg/dL (H)).   Medical History: Past Medical History:  Diagnosis Date  . Anemia   . Cellulitis and abscess of foot 08/24/2011  . Cocaine abuse 2016  . Diastolic congestive heart failure ([place])   . ESRD on dialysis ([place])    "TTS; [address]" (10/29/2016)  . GERD (gastroesophageal reflux disease)    unsure  . HCVD (hypertensive cardiovascular disease) 09/2016   Echo shows moderate to severe LAE, grade 1 DD  . Hypertension 2015  . Pneumonia 10/2016  . Protein calorie malnutrition ([place])   . PVD (peripheral vascular disease) ([place])   . Type II diabetes mellitus ([place]) 2015    Assessment: 56yo male c/o non-radiating CP associated w/ dizziness, CT reveals small bilateral PE without signs of RHS, to begin heparin.  Goal of Therapy:  Heparin level 0.3-0.7 units/ml Monitor platelets by anticoagulation protocol: Yes   Plan:  Will give heparin 4000 units IV bolus x1 followed by gtt at 1400 units/hr and monitor heparin levels and CBC.  Wynona Neat, PharmD, BCPS  04/02/2017,3:55 AM

## 2017-04-02 NOTE — Progress Notes (Signed)
New Admission Note:  Arrival Method: Stretcher from HD with RN Mental Orientation: Alert and oriented x4 Telemetry: Box 20, CCMD notified Assessment: Completed Skin: Assessed with [place], Check flowsheets IV: L FA, Heparin gtt@14  infusing Pain: 0/10 Tubes: N/A Safety Measures: Safety Fall Prevention Plan was given, discussed and signed by patient. Admission: Completed [address] Orientation: Patient has been orientated to the room, unit and the staff. Family: None at bedside.  Orders have been reviewed and implemented. Will continue to monitor the patient. Call light has been placed within reach.  Nena Polio BSN, RN  Phone Number: [phone]

## 2017-04-02 NOTE — Progress Notes (Signed)
   Follow Up Note  HPI: 56 year old male with past oral history of end-stage renal disease on hemodialysis (last session Saturday 7/28) presented to the emergency room on the early morning of 7/31 with complaints of chest pain that started as sharp more on the left earlier in the night. In the emergency room, patient was evaluated and found to have bilateral PE with no strain pattern. Hemodynamically stable and not requiring oxygen. Started on heparin and admitted to hospitalist service early this morning. Seen in the emergency room.  Patient doing okay. No pain and breathing relatively comfortably. Feels very tired    Exam: CV: Regular rate and rhythm, S1-S2 Lungs: Clear to auscultation bilaterally Abd: Soft, nontender, nondistended, positive bowel sounds Ext: No clubbing or cyanosis or edema  Present on Admission: . Bilateral Pulmonary embolism ([place]): Unclear etiology. No family history. He is a dialysis, we will need to be on Coumadin, transitioning from heparin. Checking 2-D echocardiogram. . Essential hypertension: Continue Cozaar and Norvasc End-stage renal disease on hemodialysis: Nephrology consulted. Patient had a failed thrombectomy(large retained thrombus burden in the aneurysms) of the right brachiocephalic fistula 2 weeks ago. Nephrology placed right IJ tunneled HD catheter through which has been getting his dialysis. Fistula today appears to be quite functional according to them. For hemodialysis today.  . Non-insulin-dependent diabetes mellitus with renal complications ([place]): Monitor CBGs. Leukocytosis: Likely stress margination: Continue to follow. Elevated troponins: Mild. Given setting of end-stage renal disease plus acute PE, false elevation. No evidence at this time to think of acute ischemia. We'll continue to follow troponins. Maximum peak at 0.19   Disposition: As below be here for several days until INR is therapeutic

## 2017-04-02 NOTE — ED Notes (Signed)
Patient taken to CT.

## 2017-04-02 NOTE — Progress Notes (Signed)
[facility] for heparin + warfarin Indication: pulmonary embolus  Allergies  Allergen Reactions  . No Known Allergies     Patient Measurements: Height: 5\' 11"  (180.3 cm) Weight: 195 lb 1.7 oz (88.5 kg) IBW/kg (Calculated) : 75.3  Vital Signs: BP: 123/72 (07/31 1415) Pulse Rate: 85 (07/31 1415)  Labs:  Recent Labs  04/02/17 0200 04/02/17 0226 04/02/17 0624 04/02/17 1429  HGB 10.2* 9.9* 9.8*  --   HCT 29.8* 29.0* 28.7*  --   PLT 309  --  285  --   LABPROT  --   --   --  13.8  INR  --   --   --  1.06  HEPARINUNFRC  --   --   --  0.65  CREATININE  --  9.50* 8.78*  --   TROPONINI  --   --  0.19*  --     Estimated Creatinine Clearance: 10.1 mL/min (A) (by C-G formula based on SCr of 8.78 mg/dL (H)).  Assessment: 56yo male with ESRD on HD c/o non-radiating CP associated w/ dizziness, CT reveals small bilateral PE without signs of RHS, and was started on IV heparin. Heparin level therapeutic x1 at 0.65. Now planning to bridge to warfarin. Baseline INR 1.06. Hg 9.8, plt wnl. No bleed documented.  Day #1/5 overlap.  Goal of Therapy:  INR 2-3 Heparin level 0.3-0.7 units/ml Monitor platelets by anticoagulation protocol: Yes   Plan:  Continue heparin at 1400 units/h Warfarin 7.5mg  PO x 1 tonight 6h heparin level to confirm Daily heparin level/CBC/INR Monitor s/sx bleeding   Elicia Lamp, PharmD, BCPS Clinical Pharmacist 04/02/2017 3:15 PM

## 2017-04-02 NOTE — ED Notes (Signed)
cbg was 159

## 2017-04-02 NOTE — Progress Notes (Addendum)
*  Preliminary Results* Bilateral lower extremity venous duplex completed. The right lower extremity is positive for acute deep vein thrombosis involving the right mid femoral vein. The left lower extremity is negative for deep vein thrombosis. There is no evidence of Baker's cyst bilaterally.  Preliminary results discussed with Dr. [place] Pink.  04/02/2017 10:46 AM Maudry Mayhew, BS, RVT, RDCS, RDMS

## 2017-04-02 NOTE — ED Notes (Signed)
cbg was 173

## 2017-04-02 NOTE — ED Notes (Signed)
Pt given [place] tray with sandwhich. And cracker s

## 2017-04-03 ENCOUNTER — Encounter (HOSPITAL_COMMUNITY): Payer: Self-pay | Admitting: Physician Assistant

## 2017-04-03 ENCOUNTER — Ambulatory Visit: Payer: Medicare Other | Admitting: Cardiovascular Disease

## 2017-04-03 ENCOUNTER — Observation Stay (HOSPITAL_BASED_OUTPATIENT_CLINIC_OR_DEPARTMENT_OTHER): Payer: Medicare Other

## 2017-04-03 DIAGNOSIS — E1151 Type 2 diabetes mellitus with diabetic peripheral angiopathy without gangrene: Secondary | ICD-10-CM | POA: Diagnosis present

## 2017-04-03 DIAGNOSIS — I2601 Septic pulmonary embolism with acute cor pulmonale: Secondary | ICD-10-CM | POA: Diagnosis not present

## 2017-04-03 DIAGNOSIS — I120 Hypertensive chronic kidney disease with stage 5 chronic kidney disease or end stage renal disease: Secondary | ICD-10-CM | POA: Diagnosis not present

## 2017-04-03 DIAGNOSIS — T82590A Other mechanical complication of surgically created arteriovenous fistula, initial encounter: Secondary | ICD-10-CM | POA: Diagnosis present

## 2017-04-03 DIAGNOSIS — E1121 Type 2 diabetes mellitus with diabetic nephropathy: Secondary | ICD-10-CM | POA: Diagnosis not present

## 2017-04-03 DIAGNOSIS — K219 Gastro-esophageal reflux disease without esophagitis: Secondary | ICD-10-CM | POA: Diagnosis present

## 2017-04-03 DIAGNOSIS — I519 Heart disease, unspecified: Secondary | ICD-10-CM

## 2017-04-03 DIAGNOSIS — R0602 Shortness of breath: Secondary | ICD-10-CM | POA: Diagnosis not present

## 2017-04-03 DIAGNOSIS — I10 Essential (primary) hypertension: Secondary | ICD-10-CM | POA: Diagnosis not present

## 2017-04-03 DIAGNOSIS — D631 Anemia in chronic kidney disease: Secondary | ICD-10-CM | POA: Diagnosis present

## 2017-04-03 DIAGNOSIS — E1122 Type 2 diabetes mellitus with diabetic chronic kidney disease: Secondary | ICD-10-CM | POA: Diagnosis present

## 2017-04-03 DIAGNOSIS — I340 Nonrheumatic mitral (valve) insufficiency: Secondary | ICD-10-CM | POA: Diagnosis not present

## 2017-04-03 DIAGNOSIS — I2699 Other pulmonary embolism without acute cor pulmonale: Secondary | ICD-10-CM | POA: Diagnosis not present

## 2017-04-03 DIAGNOSIS — Z590 Homelessness: Secondary | ICD-10-CM | POA: Diagnosis not present

## 2017-04-03 DIAGNOSIS — I2602 Saddle embolus of pulmonary artery with acute cor pulmonale: Secondary | ICD-10-CM | POA: Diagnosis not present

## 2017-04-03 DIAGNOSIS — N186 End stage renal disease: Secondary | ICD-10-CM | POA: Diagnosis not present

## 2017-04-03 DIAGNOSIS — I824Z9 Acute embolism and thrombosis of unspecified deep veins of unspecified distal lower extremity: Secondary | ICD-10-CM | POA: Diagnosis not present

## 2017-04-03 DIAGNOSIS — Z791 Long term (current) use of non-steroidal anti-inflammatories (NSAID): Secondary | ICD-10-CM | POA: Diagnosis not present

## 2017-04-03 DIAGNOSIS — Z992 Dependence on renal dialysis: Secondary | ICD-10-CM | POA: Diagnosis not present

## 2017-04-03 DIAGNOSIS — Y828 Other medical devices associated with adverse incidents: Secondary | ICD-10-CM | POA: Diagnosis present

## 2017-04-03 DIAGNOSIS — T82898A Other specified complication of vascular prosthetic devices, implants and grafts, initial encounter: Secondary | ICD-10-CM | POA: Diagnosis not present

## 2017-04-03 DIAGNOSIS — N185 Chronic kidney disease, stage 5: Secondary | ICD-10-CM | POA: Diagnosis not present

## 2017-04-03 DIAGNOSIS — I132 Hypertensive heart and chronic kidney disease with heart failure and with stage 5 chronic kidney disease, or end stage renal disease: Secondary | ICD-10-CM | POA: Diagnosis present

## 2017-04-03 DIAGNOSIS — N2581 Secondary hyperparathyroidism of renal origin: Secondary | ICD-10-CM | POA: Diagnosis present

## 2017-04-03 DIAGNOSIS — I5032 Chronic diastolic (congestive) heart failure: Secondary | ICD-10-CM | POA: Diagnosis present

## 2017-04-03 LAB — ECHOCARDIOGRAM COMPLETE
Ao-asc: 32 cm
CHL CUP MV DEC (S): 225
E/e' ratio: 8.72
EWDT: 225 ms
FS: 27 % — AB (ref 28–44)
Height: 71 in
IV/PV OW: 1.12
LA ID, A-P, ES: 42 mm
LA diam end sys: 42 mm
LA diam index: 1.92 cm/m2
LAVOL: 76.9 mL
LAVOLA4C: 83.8 mL
LAVOLIN: 35.2 mL/m2
LDCA: 3.46 cm2
LV TDI E'LATERAL: 10.4
LV TDI E'MEDIAL: 6.09
LVEEAVG: 8.72
LVEEMED: 8.72
LVELAT: 10.4 cm/s
LVOT diameter: 21 mm
Lateral S' vel: 13.5 cm/s
MRPISAEROA: 0.07 cm2
MV Peak grad: 3 mmHg
MV VTI: 184 cm
MV pk A vel: 101 m/s
MVPKEVEL: 90.7 m/s
PW: 11.4 mm — AB (ref 0.6–1.1)
RV TAPSE: 26.5 mm
RV sys press: 18 mmHg
Reg peak vel: 196 cm/s
TRMAXVEL: 196 cm/s
Weight: 3296 oz

## 2017-04-03 LAB — CBC
HCT: 28.2 % — ABNORMAL LOW (ref 39.0–52.0)
Hemoglobin: 9.7 g/dL — ABNORMAL LOW (ref 13.0–17.0)
MCH: 31.7 pg (ref 26.0–34.0)
MCHC: 34.4 g/dL (ref 30.0–36.0)
MCV: 92.2 fL (ref 78.0–100.0)
PLATELETS: 295 10*3/uL (ref 150–400)
RBC: 3.06 MIL/uL — ABNORMAL LOW (ref 4.22–5.81)
RDW: 15.4 % (ref 11.5–15.5)
WBC: 12.2 10*3/uL — ABNORMAL HIGH (ref 4.0–10.5)

## 2017-04-03 LAB — GLUCOSE, CAPILLARY
GLUCOSE-CAPILLARY: 225 mg/dL — AB (ref 65–99)
GLUCOSE-CAPILLARY: 177 mg/dL — AB (ref 65–99)
Glucose-Capillary: 99 mg/dL (ref 65–99)
Glucose-Capillary: 174 mg/dL — ABNORMAL HIGH (ref 65–99)

## 2017-04-03 LAB — HEPARIN LEVEL (UNFRACTIONATED)
Heparin Unfractionated: 0.72 IU/mL — ABNORMAL HIGH (ref 0.30–0.70)
Heparin Unfractionated: 0.63 IU/mL (ref 0.30–0.70)
Heparin Unfractionated: 0.65 IU/mL (ref 0.30–0.70)

## 2017-04-03 LAB — PROTIME-INR
INR: 1.07
Prothrombin Time: 13.9 seconds (ref 11.4–15.2)

## 2017-04-03 MED ORDER — WARFARIN SODIUM 7.5 MG PO TABS: 7.5000 mg | ORAL_TABLET | Freq: Once | ORAL | Status: DC

## 2017-04-03 MED ORDER — DARBEPOETIN ALFA 40 MCG/0.4ML IJ SOSY
40.0000 ug | PREFILLED_SYRINGE | INTRAMUSCULAR | Status: DC
  Administered 2017-04-04 – 2017-04-11 (×2): 40 ug via INTRAVENOUS
  Filled 2017-04-03 (×2): qty 0.4

## 2017-04-03 NOTE — Progress Notes (Signed)
ANTICOAGULATION CONSULT NOTE - Follow Up Consult  Pharmacy Consult for Heparin  Indication: pulmonary embolus  Allergies  Allergen Reactions  . No Known Allergies     Patient Measurements: Height: 5\' 11"  (180.3 cm) Weight: 206 lb (93.4 kg) IBW/kg (Calculated) : 75.3  Vital Signs: Temp: 98.5 F (36.9 C) (07/31 2036) Temp Source: Oral (07/31 1903) BP: 129/78 (07/31 2036) Pulse Rate: 98 (07/31 2036)  Labs:  Recent Labs  04/02/17 0200 04/02/17 0226 04/02/17 0624 04/02/17 1429 04/02/17 2025 04/03/17 0012  HGB 10.2* 9.9* 9.8*  --   --  9.7*  HCT 29.8* 29.0* 28.7*  --   --  28.2*  PLT 309  --  285  --   --  295  LABPROT  --   --   --  13.8  --  13.9  INR  --   --   --  1.06  --  1.07  HEPARINUNFRC  --   --   --  0.65  --  0.72*  CREATININE  --  9.50* 8.78*  --   --   --   TROPONINI  --   --  0.19* 0.13* 0.13*  --     Estimated Creatinine Clearance: 11.1 mL/min (A) (by C-G formula based on SCr of 8.78 mg/dL (H)).    Assessment: Heparin/warfarin overlap for new onset PE, heparin level is slightly elevated this AM, no issues per RN.   Goal of Therapy:  Heparin level 0.3-0.7 units/ml Monitor platelets by anticoagulation protocol: Yes   Plan:  -Dec heparin to 1250 units/hr -0900 HL  Narda Bonds 04/03/2017,1:07 AM
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
## 2017-04-03 NOTE — Progress Notes (Signed)
Patient has small amount of blood from AVF site. RN applied pressure and applied dry dressing. RN will continue to monitor patient.  Ermalinda Memos, RN

## 2017-04-03 NOTE — Progress Notes (Signed)
[facility] Progress Note   Subjective:  Seen in room. Dyspnea improved today. No CP or leg pain. Had some mild bleeding from his AVF and nosebleed overnight (on heparin drip for DVT/PE).  Objective Vitals:   04/02/17 2036 04/02/17 2049 04/03/17 0452 04/03/17 0917  BP: 129/78  131/79 136/87  Pulse: 98  88 89  Resp: 17  18 18   Temp: 98.5 F (36.9 C)  98.5 F (36.9 C) 98.6 F (37 C)  TempSrc:    Oral  SpO2: 100%  98% 98%  Weight:  93.4 kg (206 lb)    Height:  5\' 11"  (1.803 m)     Physical Exam General: Well appearing male, NAD. Heart: RRR; no murmur. Lungs: CTA anteriorly Extremities: No LE edema Dialysis Access: R TDC and RUE aneurysmal AVF + bruit/thrill  Additional Objective Labs: Basic Metabolic Panel:  Recent Labs Lab 04/02/17 0226 04/02/17 0624  NA 135 133*  K 4.0 4.2  CL 101 100*  CO2  --  20*  GLUCOSE 216* 159*  BUN 57* 60*  CREATININE 9.50* 8.78*  CALCIUM  --  9.3   Liver Function Tests:  Recent Labs Lab 04/02/17 0624  AST 19  ALT 17  ALKPHOS 89  BILITOT 0.7  PROT 7.3  ALBUMIN 3.5   CBC:  Recent Labs Lab 04/02/17 0200 04/02/17 0226 04/02/17 0624 04/03/17 0012  WBC 12.3*  --  14.4* 12.2*  NEUTROABS 8.0*  --  9.4*  --   HGB 10.2* 9.9* 9.8* 9.7*  HCT 29.8* 29.0* 28.7* 28.2*  MCV 93.7  --  93.8 92.2  PLT 309  --  285 295   Cardiac Enzymes:  Recent Labs Lab 04/02/17 0624 04/02/17 1429 04/02/17 2025  TROPONINI 0.19* 0.13* 0.13*   CBG:  Recent Labs Lab 04/02/17 0804 04/02/17 1208 04/02/17 2034 04/03/17 0730  GLUCAP 173* 159* 138* 99   Studies/Results: Dg Chest 2 View  Result Date: 04/02/2017 CLINICAL DATA:  56 year old male with centralized chest pain. EXAM: CHEST  2 VIEW COMPARISON:  Chest radiograph dated 02/05/2017 FINDINGS: Right IJ dialysis catheter with tip over right atrium. There is top-normal cardiac size. No vascular congestion or edema. Minimal right lung base atelectatic changes noted. No focal  consolidation, pleural effusion, or pneumothorax. No acute osseous pathology. IMPRESSION: 1. No acute cardiopulmonary process. Minimal right lung base atelectatic changes. 2. Top-normal cardiac size.  No vascular congestion or edema. 3. Minimal right lung base atelectatic changes. No focal consolidation. Electronically Signed   By: Anner Crete M.D.   On: 04/02/2017 02:23   Ct Angio Chest Pe W And/or Wo Contrast  Result Date: 04/02/2017 CLINICAL DATA:  56 year old male with history of diabetes and CHF presenting with chest pain. EXAM: CT ANGIOGRAPHY CHEST WITH CONTRAST TECHNIQUE: Multidetector CT imaging of the chest was performed using the standard protocol during bolus administration of intravenous contrast. Multiplanar CT image reconstructions and MIPs were obtained to evaluate the vascular anatomy. CONTRAST:  100 cc Isovue 370 COMPARISON:  Chest radiograph dated 04/02/2017 chest CT dated 10/01/2016 FINDINGS: Cardiovascular: There is mild cardiomegaly. No pericardial effusion. There is multi vessel coronary vascular calcification involving the left main, lad, RCA, and left circumflex artery. A right IJ dialysis catheter is seen with tip in the right atrium. The thoracic aorta appears unremarkable for the degree of enhancement. Small partially occlusive bilateral lower lobe segmental pulmonary emboli noted. This finding is new compared to the CT of 10/01/2016. There is no CT evidence of right heart straining Mediastinum/Nodes:  No hilar or mediastinal adenopathy. The esophagus and the thyroid gland are grossly unremarkable. Lungs/Pleura: The lungs are clear. There is no pleural effusion or pneumothorax. The central airways are patent. Upper Abdomen: No acute abnormality. Musculoskeletal: Mild degenerative changes of the spine. No acute osseous pathology. Review of the MIP images confirms the above findings. IMPRESSION: 1. Small partially occlusive bilateral lower lobe pulmonary artery emboli. No CT evidence  of right heart straining. 2. Mild cardiomegaly. Advanced multi vessel coronary vascular calcification. Critical Value/emergent results were called by telephone at the time of interpretation on 04/02/2017 at 3:43 am to Dr. Randal Buba, who verbally acknowledged these results. Electronically Signed   By: Anner Crete M.D.   On: 04/02/2017 03:45   Medications: . heparin 1,250 Units/hr (04/03/17 0114)   . amLODipine  5 mg Oral QHS  . cinacalcet  60 mg Oral Q breakfast  . insulin aspart  0-9 Units Subcutaneous TID WC  . lanthanum  1,000 mg Oral TID WC  . losartan  25 mg Oral QHS  . warfarin  7.5 mg Oral ONCE-1800  . Warfarin - Pharmacist Dosing Inpatient   Does not apply q1800   Dialysis Orders: [facility] Tuesday/Thursday/Saturday, 4 hours, 180 dialyzer, EDW 90 kg, blood flow rate 400, dialysate flow rate 800, 2K/2.5 calcium, no UF profile, no sodium modeling. Right IJ TDC after failed thrombectomy of right brachiocephalic fistula. Heparin 6000 unit bolus at dialysis.  Assessment/Plan: 1. Bilateral pulmonary embolism: Hemodynamically stable, no R heart strain. Remains on heparin drip, warfarin being started. Cause of PE could be R femoral vein DVT or recent attempted RUE thrombectomy (now open again). 2. ESRD: Will continue TTS HD schedule, next 8/2. 3. HTN/volume: BP fine, will keep current EDW/meds for now. 4. Anemia: Hgb 9.7. ESA last given 7/5. Will resume low dose Aranesp with next HD. 5. Secondary hyperparathyroidism: Ca ok, phos pending. Continue Lanthanum as binder, no VDRA for now (previously was on as outpt, recently held in setting of Ca 10.5). Continue sensipar. 6. Access issues: Has aneurysmal RUE AVF which was found to be thrombosed 2 weeks ago. Underwent failed thrombectomy (large burden of retained thrombus) at [facility] and R TDC placed at the time. On admit here, noted that the R AVF appeared patent with active thrill/bruit. Was cannulated without issues on 7/31, but  towards the end of HD, developed higher venous pressues and therefore he had to be changed over the the [facility] to complete his treatment. Now on heparin drip/warfarin, but Dr. Posey Pronto plans to speak to vascular surgery today about potentially getting fistulogram while here to see if the AVF is salvageable. 7. Type 2 DM: On SSI, per primary.  Veneta Penton, PA-C 04/03/2017, 9:43 AM  [facility] Pager: [phone]

## 2017-04-03 NOTE — Progress Notes (Signed)
ANTICOAGULATION CONSULT NOTE - Follow Up Consult  Pharmacy Consult for Heparin Indication: pulmonary embolus  Allergies  Allergen Reactions  . No Known Allergies     Patient Measurements: Height: 5\' 11"  (180.3 cm) Weight: 206 lb (93.4 kg) IBW/kg (Calculated) : 75.3  Vital Signs: Temp: 98.2 F (36.8 C) (08/01 1847) Temp Source: Oral (08/01 1847) BP: 142/79 (08/01 1847) Pulse Rate: 92 (08/01 1847)  Labs:  Recent Labs  04/02/17 0200 04/02/17 0226 04/02/17 0624  04/02/17 1429 04/02/17 2025 04/03/17 0012 04/03/17 1052 04/03/17 1836  HGB 10.2* 9.9* 9.8*  --   --   --  9.7*  --   --   HCT 29.8* 29.0* 28.7*  --   --   --  28.2*  --   --   PLT 309  --  285  --   --   --  295  --   --   LABPROT  --   --   --   --  13.8  --  13.9  --   --   INR  --   --   --   --  1.06  --  1.07  --   --   HEPARINUNFRC  --   --   --   < > 0.65  --  0.72* 0.63 0.65  CREATININE  --  9.50* 8.78*  --   --   --   --   --   --   TROPONINI  --   --  0.19*  --  0.13* 0.13*  --   --   --   < > = values in this interval not displayed.  Estimated Creatinine Clearance: 11.1 mL/min (A) (by C-G formula based on SCr of 8.78 mg/dL (H)).  Medications: Heparin @ 1250 units/hr  Assessment: 55yom continues on heparin for new onset PE. Confirmatory heparin level is therapeutic at 0.65.   Goal of Therapy:  Heparin level 0.3-0.7 Monitor platelets by anticoagulation protocol: Yes   Plan:  1) Continue heparin at 1250 units/hr 2) Follow up daily heparin level  Nena Jordan, PharmD, BCPS 04/03/2017 7:30 PM

## 2017-04-03 NOTE — Progress Notes (Signed)
  Echocardiogram 2D Echocardiogram has been performed.  Derry Arbogast G Joakim Huesman 04/03/2017, 1:14 PM

## 2017-04-03 NOTE — Progress Notes (Addendum)
[facility] PROGRESS NOTE  [name] DJS:[id number] DOB: 08-17-1961 DOA: 04/02/2017   PCP: [name], MD     Assessment/Plan: Principal Problem:   Pulmonary embolism ([place]) Active Problems:   Essential hypertension   Non-insulin-dependent diabetes mellitus with renal complications ([place])   End stage renal disease on dialysis ([place])   ESRD on dialysis [facility])   56 year old man of Hispanic origin with past medical history significant for hypertension, type 2 diabetes mellitus and end-stage renal disease on hemodialysis who presents with sudden onset of pleuritic type chest pain that started last night. CT scan of the chest showed small partially occlusive bilateral lower lobe pulmonary artery emboli with no evidence of right heart strain for which is being admitted for inpatient management. There is no official report yet but the patient also reports having undergone venous Dopplers of the lower extremities that shows DVT. Patient recently underwent failed thrombectomy of right brachiocephalic fistula because of large retained thrombus , status post right IJ tunneled hemodialysis catheter through which he has been getting his dialysis.  Assessment and plan . Bilateral Pulmonary embolism ([place]): Likely secondary to recent surgery. He is a dialysis, we will need to be on Coumadin, with heparin bridge. 2-D echo pending  . Essential hypertension: Continue Cozaar and Norvasc  End-stage renal disease on hemodialysis: Nephrology consulted. Patient had a failed thrombectomy(large retained thrombus burden in the aneurysms) of the right brachiocephalic fistula 2 weeks ago. Nephrology placed right IJ tunneled HD catheter through which has been getting his dialysis. Fistula today appears to be quite functional according to them. Patient would need a fistulogram, May need salvage of current AVF , discuss with Dr. Posey Pronto, no immediate vascular procedure scheduled, therefore we'll  continue with Coumadin until INR is therapeutic  . Non-insulin-dependent diabetes mellitus with renal complications ([place]): Uncontrolled CBGs. Most recent hemoglobin A1c 7.1. Does not take any medications for diabetes at home. Continue SSI  Leukocytosis: Likely stress margination: Continue to follow.  Elevated troponins: Mild. Given setting of end-stage renal disease plus acute PE, false elevation. No evidence at this time to think of acute ischemia.     DVT prophylaxsis heparin/Coumadin  Code Status:     Family Communication: Discussed in detail with the patient, all imaging results, lab results explained to the patient   Disposition Plan:  As per nephrology , pending INR to be therapeutic      Consultants:  Nephrology  Vascular    Procedures:  Fistulogram  Antibiotics: Anti-infectives    None         HPI/Subjective:  Denies any chest pain shortness of breath  Objective: Vitals:   04/02/17 2036 04/02/17 2049 04/03/17 0452 04/03/17 0917  BP: 129/78  131/79 136/87  Pulse: 98  88 89  Resp: 17  18 18   Temp: 98.5 F (36.9 C)  98.5 F (36.9 C) 98.6 F (37 C)  TempSrc:    Oral  SpO2: 100%  98% 98%  Weight:  93.4 kg (206 lb)    Height:  5\' 11"  (1.803 m)      Intake/Output Summary (Last 24 hours) at 04/03/17 1240 Last data filed at 04/03/17 0900  Gross per 24 hour  Intake           587.75 ml  Output             3050 ml  Net         -2462.25 ml    Exam:  Examination:  General  exam: Appears calm and comfortable  Respiratory system: Clear to auscultation. Respiratory effort normal. Cardiovascular system: S1 & S2 heard, RRR. No JVD, murmurs, rubs, gallops or clicks. No pedal edema. Gastrointestinal system: Abdomen is nondistended, soft and nontender. No organomegaly or masses felt. Normal bowel sounds heard. Central nervous system: Alert and oriented. No focal neurological deficits. Extremities: Symmetric 5 x 5 power. Skin: No rashes, lesions or  ulcers Psychiatry: Judgement and insight appear normal. Mood & affect appropriate.     Data Reviewed: I have personally reviewed following labs and imaging studies  Micro Results Recent Results (from the past 240 hour(s))  MRSA PCR Screening     Status: None   Collection Time: 04/02/17  9:02 PM  Result Value Ref Range Status   MRSA by PCR NEGATIVE NEGATIVE Final    Comment:        The GeneXpert MRSA Assay (FDA approved for NASAL specimens only), is one component of a comprehensive MRSA colonization surveillance program. It is not intended to diagnose MRSA infection nor to guide or monitor treatment for MRSA infections.     Radiology Reports Dg Chest 2 View  Result Date: 04/02/2017 CLINICAL DATA:  56 year old male with centralized chest pain. EXAM: CHEST  2 VIEW COMPARISON:  Chest radiograph dated 02/05/2017 FINDINGS: Right IJ dialysis catheter with tip over right atrium. There is top-normal cardiac size. No vascular congestion or edema. Minimal right lung base atelectatic changes noted. No focal consolidation, pleural effusion, or pneumothorax. No acute osseous pathology. IMPRESSION: 1. No acute cardiopulmonary process. Minimal right lung base atelectatic changes. 2. Top-normal cardiac size.  No vascular congestion or edema. 3. Minimal right lung base atelectatic changes. No focal consolidation. Electronically Signed   By: Anner Crete M.D.   On: 04/02/2017 02:23   Ct Angio Chest Pe W And/or Wo Contrast  Result Date: 04/02/2017 CLINICAL DATA:  56 year old male with history of diabetes and CHF presenting with chest pain. EXAM: CT ANGIOGRAPHY CHEST WITH CONTRAST TECHNIQUE: Multidetector CT imaging of the chest was performed using the standard protocol during bolus administration of intravenous contrast. Multiplanar CT image reconstructions and MIPs were obtained to evaluate the vascular anatomy. CONTRAST:  100 cc Isovue 370 COMPARISON:  Chest radiograph dated 04/02/2017 chest CT  dated 10/01/2016 FINDINGS: Cardiovascular: There is mild cardiomegaly. No pericardial effusion. There is multi vessel coronary vascular calcification involving the left main, lad, RCA, and left circumflex artery. A right IJ dialysis catheter is seen with tip in the right atrium. The thoracic aorta appears unremarkable for the degree of enhancement. Small partially occlusive bilateral lower lobe segmental pulmonary emboli noted. This finding is new compared to the CT of 10/01/2016. There is no CT evidence of right heart straining Mediastinum/Nodes: No hilar or mediastinal adenopathy. The esophagus and the thyroid gland are grossly unremarkable. Lungs/Pleura: The lungs are clear. There is no pleural effusion or pneumothorax. The central airways are patent. Upper Abdomen: No acute abnormality. Musculoskeletal: Mild degenerative changes of the spine. No acute osseous pathology. Review of the MIP images confirms the above findings. IMPRESSION: 1. Small partially occlusive bilateral lower lobe pulmonary artery emboli. No CT evidence of right heart straining. 2. Mild cardiomegaly. Advanced multi vessel coronary vascular calcification. Critical Value/emergent results were called by telephone at the time of interpretation on 04/02/2017 at 3:43 am to Dr. Randal Buba, who verbally acknowledged these results. Electronically Signed   By: Anner Crete M.D.   On: 04/02/2017 03:45   Dg Foot Complete Right  Result Date: 03/11/2017 Please  see detailed radiograph report in office note.    CBC  Recent Labs Lab 04/02/17 0200 04/02/17 0226 04/02/17 0624 04/03/17 0012  WBC 12.3*  --  14.4* 12.2*  HGB 10.2* 9.9* 9.8* 9.7*  HCT 29.8* 29.0* 28.7* 28.2*  PLT 309  --  285 295  MCV 93.7  --  93.8 92.2  MCH 32.1  --  32.0 31.7  MCHC 34.2  --  34.1 34.4  RDW 15.4  --  15.6* 15.4  LYMPHSABS 2.9  --  3.3  --   MONOABS 0.9  --  1.1*  --   EOSABS 0.5  --  0.6  --   BASOSABS 0.1  --  0.1  --     Chemistries   Recent  Labs Lab 04/02/17 0226 04/02/17 0624  NA 135 133*  K 4.0 4.2  CL 101 100*  CO2  --  20*  GLUCOSE 216* 159*  BUN 57* 60*  CREATININE 9.50* 8.78*  CALCIUM  --  9.3  AST  --  19  ALT  --  17  ALKPHOS  --  89  BILITOT  --  0.7   ------------------------------------------------------------------------------------------------------------------ estimated creatinine clearance is 11.1 mL/min (A) (by C-G formula based on SCr of 8.78 mg/dL (H)). ------------------------------------------------------------------------------------------------------------------ No results for input(s): HGBA1C in the last 72 hours. ------------------------------------------------------------------------------------------------------------------ No results for input(s): CHOL, HDL, LDLCALC, TRIG, CHOLHDL, LDLDIRECT in the last 72 hours. ------------------------------------------------------------------------------------------------------------------ No results for input(s): TSH, T4TOTAL, T3FREE, THYROIDAB in the last 72 hours.  Invalid input(s): FREET3 ------------------------------------------------------------------------------------------------------------------ No results for input(s): VITAMINB12, FOLATE, FERRITIN, TIBC, IRON, RETICCTPCT in the last 72 hours.  Coagulation profile  Recent Labs Lab 04/02/17 1429 04/03/17 0012  INR 1.06 1.07    No results for input(s): DDIMER in the last 72 hours.  Cardiac Enzymes  Recent Labs Lab 04/02/17 0624 04/02/17 1429 04/02/17 2025  TROPONINI 0.19* 0.13* 0.13*   ------------------------------------------------------------------------------------------------------------------ Invalid input(s): POCBNP   CBG:  Recent Labs Lab 04/02/17 0804 04/02/17 1208 04/02/17 2034 04/03/17 0730 04/03/17 1142  GLUCAP 173* 159* 138* 99 225*       Studies: Dg Chest 2 View  Result Date: 04/02/2017 CLINICAL DATA:  56 year old male with centralized chest pain.  EXAM: CHEST  2 VIEW COMPARISON:  Chest radiograph dated 02/05/2017 FINDINGS: Right IJ dialysis catheter with tip over right atrium. There is top-normal cardiac size. No vascular congestion or edema. Minimal right lung base atelectatic changes noted. No focal consolidation, pleural effusion, or pneumothorax. No acute osseous pathology. IMPRESSION: 1. No acute cardiopulmonary process. Minimal right lung base atelectatic changes. 2. Top-normal cardiac size.  No vascular congestion or edema. 3. Minimal right lung base atelectatic changes. No focal consolidation. Electronically Signed   By: Anner Crete M.D.   On: 04/02/2017 02:23   Ct Angio Chest Pe W And/or Wo Contrast  Result Date: 04/02/2017 CLINICAL DATA:  56 year old male with history of diabetes and CHF presenting with chest pain. EXAM: CT ANGIOGRAPHY CHEST WITH CONTRAST TECHNIQUE: Multidetector CT imaging of the chest was performed using the standard protocol during bolus administration of intravenous contrast. Multiplanar CT image reconstructions and MIPs were obtained to evaluate the vascular anatomy. CONTRAST:  100 cc Isovue 370 COMPARISON:  Chest radiograph dated 04/02/2017 chest CT dated 10/01/2016 FINDINGS: Cardiovascular: There is mild cardiomegaly. No pericardial effusion. There is multi vessel coronary vascular calcification involving the left main, lad, RCA, and left circumflex artery. A right IJ dialysis catheter is seen with tip in the right atrium. The thoracic aorta appears  unremarkable for the degree of enhancement. Small partially occlusive bilateral lower lobe segmental pulmonary emboli noted. This finding is new compared to the CT of 10/01/2016. There is no CT evidence of right heart straining Mediastinum/Nodes: No hilar or mediastinal adenopathy. The esophagus and the thyroid gland are grossly unremarkable. Lungs/Pleura: The lungs are clear. There is no pleural effusion or pneumothorax. The central airways are patent. Upper Abdomen: No  acute abnormality. Musculoskeletal: Mild degenerative changes of the spine. No acute osseous pathology. Review of the MIP images confirms the above findings. IMPRESSION: 1. Small partially occlusive bilateral lower lobe pulmonary artery emboli. No CT evidence of right heart straining. 2. Mild cardiomegaly. Advanced multi vessel coronary vascular calcification. Critical Value/emergent results were called by telephone at the time of interpretation on 04/02/2017 at 3:43 am to Dr. Randal Buba, who verbally acknowledged these results. Electronically Signed   By: Anner Crete M.D.   On: 04/02/2017 03:45      Lab Results  Component Value Date   HGBA1C 7.1 (H) 09/29/2016   HGBA1C 7.5 07/20/2016   HGBA1C 9.5 12/21/2015   Lab Results  Component Value Date   MICROALBUR 209.2 (H) 11/05/2014   LDLCALC 72 09/30/2016   CREATININE 8.78 (H) 04/02/2017       Scheduled Meds: . amLODipine  5 mg Oral QHS  . cinacalcet  60 mg Oral Q breakfast  . [START ON 04/04/2017] darbepoetin (ARANESP) injection - DIALYSIS  40 mcg Intravenous Q Thu-HD  . insulin aspart  0-9 Units Subcutaneous TID WC  . lanthanum  1,000 mg Oral TID WC  . losartan  25 mg Oral QHS   Continuous Infusions: . heparin 1,250 Units/hr (04/03/17 0114)     LOS: 0 days    Time spent: >30 MINS    Reyne Dumas  [facility] Pager [phone]. If 7PM-7AM, please contact night-coverage at [email or web address], password [facility] 04/03/2017, 12:40 PM  LOS: 0 days

## 2017-04-03 NOTE — Progress Notes (Signed)
ANTICOAGULATION CONSULT NOTE - Follow Up Consult  Pharmacy Consult for Heparin + warfarin  Indication: pulmonary embolus  Allergies  Allergen Reactions  . No Known Allergies     Patient Measurements: Height: 5\' 11"  (180.3 cm) Weight: 206 lb (93.4 kg) IBW/kg (Calculated) : 75.3  Vital Signs: Temp: 98.6 F (37 C) (08/01 0917) Temp Source: Oral (08/01 0917) BP: 136/87 (08/01 0917) Pulse Rate: 89 (08/01 0917)  Labs:  Recent Labs  04/02/17 0200 04/02/17 0226 04/02/17 0624 04/02/17 1429 04/02/17 2025 04/03/17 0012  HGB 10.2* 9.9* 9.8*  --   --  9.7*  HCT 29.8* 29.0* 28.7*  --   --  28.2*  PLT 309  --  285  --   --  295  LABPROT  --   --   --  13.8  --  13.9  INR  --   --   --  1.06  --  1.07  HEPARINUNFRC  --   --   --  0.65  --  0.72*  CREATININE  --  9.50* 8.78*  --   --   --   TROPONINI  --   --  0.19* 0.13* 0.13*  --     Estimated Creatinine Clearance: 11.1 mL/min (A) (by C-G formula based on SCr of 8.78 mg/dL (H)).  Assessment: Heparin/warfarin overlap for new onset PE. Heparin rate required reduction earlier this morning- now a repeat level is in range at 0.63units/mL on 1250 units/hr.  Warfarin was NOT given last evening (7/31)- warfarin is now to be on hold until after fistulagram per vascular. INR 1.07.  Hgb 9.7, plts 295. Had some bleeding from HD cath site overnight, but has resolved.  Goal of Therapy:  INR 2-3 Heparin level 0.3-0.7 units/ml Monitor platelets by anticoagulation protocol: Yes   Plan:  -Continue Heparin at 1250 units/hr -Will confirm with level in 8 hours -Follow for restart of warfarin- needs to complete 5 days of overlap therapy d/t new PE (has not started) -Daily heparin level, INR, and CBC  Samaj Wessells D. Price [place], PharmD, BCPS Clinical Pharmacist Pager: [phone] Clinical Phone for 04/03/2017 until 3:30pm: [phone] If after 3:30pm, please call main pharmacy at [phone] 04/03/2017 11:42 AM

## 2017-04-04 ENCOUNTER — Inpatient Hospital Stay (HOSPITAL_COMMUNITY): Payer: Medicare Other

## 2017-04-04 ENCOUNTER — Encounter (HOSPITAL_COMMUNITY): Payer: Self-pay | Admitting: Physician Assistant

## 2017-04-04 DIAGNOSIS — I2601 Septic pulmonary embolism with acute cor pulmonale: Secondary | ICD-10-CM

## 2017-04-04 DIAGNOSIS — I824Z9 Acute embolism and thrombosis of unspecified deep veins of unspecified distal lower extremity: Secondary | ICD-10-CM

## 2017-04-04 DIAGNOSIS — I5189 Other ill-defined heart diseases: Secondary | ICD-10-CM

## 2017-04-04 HISTORY — PX: IR DIALY SHUNT INTRO NEEDLE/INTRACATH INITIAL W/IMG RIGHT: IMG6115

## 2017-04-04 HISTORY — DX: Other ill-defined heart diseases: I51.89

## 2017-04-04 LAB — RENAL FUNCTION PANEL
Albumin: 3.3 g/dL — ABNORMAL LOW (ref 3.5–5.0)
Anion gap: 10 (ref 5–15)
BUN: 39 mg/dL — AB (ref 6–20)
CHLORIDE: 99 mmol/L — AB (ref 101–111)
CO2: 25 mmol/L (ref 22–32)
CREATININE: 7.31 mg/dL — AB (ref 0.61–1.24)
Calcium: 7.9 mg/dL — ABNORMAL LOW (ref 8.9–10.3)
GFR, EST AFRICAN AMERICAN: 9 mL/min — AB (ref >60)
GFR, EST NON AFRICAN AMERICAN: 7 mL/min — AB (ref >60)
Glucose, Bld: 103 mg/dL — ABNORMAL HIGH (ref 65–99)
POTASSIUM: 3.9 mmol/L (ref 3.5–5.1)
Phosphorus: 5.6 mg/dL — ABNORMAL HIGH (ref 2.5–4.6)
Sodium: 134 mmol/L — ABNORMAL LOW (ref 135–145)

## 2017-04-04 LAB — COMPREHENSIVE METABOLIC PANEL
ALK PHOS: 79 U/L (ref 38–126)
ALT: 12 U/L — AB (ref 17–63)
AST: 17 U/L (ref 15–41)
Albumin: 3.3 g/dL — ABNORMAL LOW (ref 3.5–5.0)
Anion gap: 11 (ref 5–15)
BUN: 40 mg/dL — AB (ref 6–20)
CALCIUM: 7.9 mg/dL — AB (ref 8.9–10.3)
CHLORIDE: 100 mmol/L — AB (ref 101–111)
CO2: 24 mmol/L (ref 22–32)
CREATININE: 7.19 mg/dL — AB (ref 0.61–1.24)
GFR, EST AFRICAN AMERICAN: 9 mL/min — AB (ref >60)
GFR, EST NON AFRICAN AMERICAN: 8 mL/min — AB (ref >60)
Glucose, Bld: 121 mg/dL — ABNORMAL HIGH (ref 65–99)
Potassium: 3.7 mmol/L (ref 3.5–5.1)
Sodium: 135 mmol/L (ref 135–145)
Total Bilirubin: 0.6 mg/dL (ref 0.3–1.2)
Total Protein: 6.9 g/dL (ref 6.5–8.1)

## 2017-04-04 LAB — CBC
HEMATOCRIT: 28.6 % — AB (ref 39.0–52.0)
HEMATOCRIT: 27.8 % — AB (ref 39.0–52.0)
HEMOGLOBIN: 9.7 g/dL — AB (ref 13.0–17.0)
Hemoglobin: 9.4 g/dL — ABNORMAL LOW (ref 13.0–17.0)
MCH: 32 pg (ref 26.0–34.0)
MCH: 31.5 pg (ref 26.0–34.0)
MCHC: 33.9 g/dL (ref 30.0–36.0)
MCHC: 33.8 g/dL (ref 30.0–36.0)
MCV: 94.4 fL (ref 78.0–100.0)
MCV: 93.3 fL (ref 78.0–100.0)
Platelets: 276 10*3/uL (ref 150–400)
Platelets: 293 10*3/uL (ref 150–400)
RBC: 3.03 MIL/uL — AB (ref 4.22–5.81)
RBC: 2.98 MIL/uL — AB (ref 4.22–5.81)
RDW: 15.8 % — ABNORMAL HIGH (ref 11.5–15.5)
RDW: 15.5 % (ref 11.5–15.5)
WBC: 10.7 10*3/uL — AB (ref 4.0–10.5)
WBC: 11.4 10*3/uL — AB (ref 4.0–10.5)

## 2017-04-04 LAB — PROTIME-INR
INR: 1.03
PROTHROMBIN TIME: 13.5 s (ref 11.4–15.2)

## 2017-04-04 LAB — GLUCOSE, CAPILLARY
GLUCOSE-CAPILLARY: 129 mg/dL — AB (ref 65–99)
Glucose-Capillary: 101 mg/dL — ABNORMAL HIGH (ref 65–99)
Glucose-Capillary: 235 mg/dL — ABNORMAL HIGH (ref 65–99)

## 2017-04-04 LAB — HEMOGLOBIN A1C
HEMOGLOBIN A1C: 7.8 % — AB (ref 4.8–5.6)
Mean Plasma Glucose: 177 mg/dL

## 2017-04-04 LAB — HEPARIN LEVEL (UNFRACTIONATED): Heparin Unfractionated: 0.49 IU/mL (ref 0.30–0.70)

## 2017-04-04 MED ORDER — ALTEPLASE 2 MG IJ SOLR: 2.0000 mg | Freq: Once | INTRAMUSCULAR | Status: DC | PRN

## 2017-04-04 MED ORDER — SODIUM CHLORIDE 0.9 % IV SOLN: 100.0000 mL | INTRAVENOUS | Status: DC | PRN

## 2017-04-04 MED ORDER — IOPAMIDOL (ISOVUE-300) INJECTION 61%
INTRAVENOUS | Status: AC
  Administered 2017-04-04: 32 mL
  Filled 2017-04-04: qty 150

## 2017-04-04 MED ORDER — DARBEPOETIN ALFA 40 MCG/0.4ML IJ SOSY
PREFILLED_SYRINGE | INTRAMUSCULAR | Status: AC
  Filled 2017-04-04: qty 0.4

## 2017-04-04 MED ORDER — HEPARIN SODIUM (PORCINE) 1000 UNIT/ML DIALYSIS: 1000.0000 [IU] | INTRAMUSCULAR | Status: DC | PRN

## 2017-04-04 NOTE — Procedures (Signed)
Right upper extremity fistulogram:  Fistula is patent with mild irregularity.  No significant stenosis.

## 2017-04-04 NOTE — Progress Notes (Signed)
[facility] PROGRESS NOTE  [name] QBH:[id number] DOB: 03/11/1961 DOA: 04/02/2017   PCP: [name], MD     Assessment/Plan: Principal Problem:   Pulmonary embolism ([place]) Active Problems:   Essential hypertension   Non-insulin-dependent diabetes mellitus with renal complications ([place])   End stage renal disease on dialysis ([place])   ESRD on dialysis [facility])   56 year old man of Hispanic origin with past medical history significant for hypertension, type 2 diabetes mellitus and end-stage renal disease on hemodialysis who presents with sudden onset of pleuritic type chest pain that started last night. CT scan of the chest showed small partially occlusive bilateral lower lobe pulmonary artery emboli with no evidence of right heart strain for which is being admitted for inpatient management.   venous Dopplers of the lower extremities  showed DVT. Patient recently underwent failed thrombectomy of right brachiocephalic fistula because of large retained thrombus , status post right IJ tunneled hemodialysis catheter through which he has been getting his dialysis.   Assessment and plan . Bilateral Pulmonary embolism ([place]): Likely secondary to recent surgery. He is a dialysis, we will need to be on Coumadin, with heparin bridge. 2-D echo  shows normal RV size and systolic function, markings noted in the right atrium, likely dialysis catheter tip with thrombus versus vegetation. Called  cardiology to see if TEE needed , patient is already on anticoagulation, no indication for TEE , low suspicion for endocarditis   . Essential hypertension: Continue Cozaar and Norvasc  End-stage renal disease on hemodialysis: Nephrology consulted. Patient had a failed thrombectomy(large retained thrombus burden in the aneurysms) of the right brachiocephalic fistula 2 weeks ago. Nephrology placed right IJ tunneled HD catheter through which has been getting his dialysis. Fistulogram by IR  today to evaluate for stenosis and potential salvage .  , May need salvage of current AVF , discussed  with Dr. Posey Pronto,  Plan for any surgical interventions prior to INR getting therapeutic.   . Non-insulin-dependent diabetes mellitus with renal complications ([place]): Uncontrolled CBGs. Most recent hemoglobin A1c 7.8. Does not take any medications for diabetes at home. Continue SSI  Leukocytosis: Likely stress margination: Continue to follow.  Elevated troponins: Mild. Given setting of end-stage renal disease plus acute PE, false elevation. No evidence at this time to think of acute ischemia.     DVT prophylaxsis heparin/Coumadin  Code Status:     Family Communication: Discussed in detail with the patient, all imaging results, lab results explained to the patient   Disposition Plan:  As per nephrology , pending INR to be therapeutic      Consultants:  Nephrology  Vascular    Procedures:  Fistulogram  Antibiotics: Anti-infectives    None         HPI/Subjective:  Denies any chest pain shortness of breath  Objective: Vitals:   04/04/17 0830 04/04/17 0900 04/04/17 0930 04/04/17 1000  BP: 105/71 107/67 106/69 124/77  Pulse: (!) 106 90 88 92  Resp: 17 13 13  (!) 22  Temp:      TempSrc:      SpO2:      Weight:      Height:        Intake/Output Summary (Last 24 hours) at 04/04/17 1005 Last data filed at 04/04/17 0600  Gross per 24 hour  Intake          1350.88 ml  Output              750 ml  Net           600.88 ml    Exam:  Examination:  General exam: Appears calm and comfortable  Respiratory system: Clear to auscultation. Respiratory effort normal. Cardiovascular system: S1 & S2 heard, RRR. No JVD, murmurs, rubs, gallops or clicks. No pedal edema. Gastrointestinal system: Abdomen is nondistended, soft and nontender. No organomegaly or masses felt. Normal bowel sounds heard. Central nervous system: Alert and oriented. No focal neurological  deficits. Extremities: Symmetric 5 x 5 power. Skin: No rashes, lesions or ulcers Psychiatry: Judgement and insight appear normal. Mood & affect appropriate.     Data Reviewed: I have personally reviewed following labs and imaging studies  Micro Results Recent Results (from the past 240 hour(s))  MRSA PCR Screening     Status: None   Collection Time: 04/02/17  9:02 PM  Result Value Ref Range Status   MRSA by PCR NEGATIVE NEGATIVE Final    Comment:        The GeneXpert MRSA Assay (FDA approved for NASAL specimens only), is one component of a comprehensive MRSA colonization surveillance program. It is not intended to diagnose MRSA infection nor to guide or monitor treatment for MRSA infections.     Radiology Reports Dg Chest 2 View  Result Date: 04/02/2017 CLINICAL DATA:  56 year old male with centralized chest pain. EXAM: CHEST  2 VIEW COMPARISON:  Chest radiograph dated 02/05/2017 FINDINGS: Right IJ dialysis catheter with tip over right atrium. There is top-normal cardiac size. No vascular congestion or edema. Minimal right lung base atelectatic changes noted. No focal consolidation, pleural effusion, or pneumothorax. No acute osseous pathology. IMPRESSION: 1. No acute cardiopulmonary process. Minimal right lung base atelectatic changes. 2. Top-normal cardiac size.  No vascular congestion or edema. 3. Minimal right lung base atelectatic changes. No focal consolidation. Electronically Signed   By: Anner Crete M.D.   On: 04/02/2017 02:23   Ct Angio Chest Pe W And/or Wo Contrast  Result Date: 04/02/2017 CLINICAL DATA:  56 year old male with history of diabetes and CHF presenting with chest pain. EXAM: CT ANGIOGRAPHY CHEST WITH CONTRAST TECHNIQUE: Multidetector CT imaging of the chest was performed using the standard protocol during bolus administration of intravenous contrast. Multiplanar CT image reconstructions and MIPs were obtained to evaluate the vascular anatomy. CONTRAST:   100 cc Isovue 370 COMPARISON:  Chest radiograph dated 04/02/2017 chest CT dated 10/01/2016 FINDINGS: Cardiovascular: There is mild cardiomegaly. No pericardial effusion. There is multi vessel coronary vascular calcification involving the left main, lad, RCA, and left circumflex artery. A right IJ dialysis catheter is seen with tip in the right atrium. The thoracic aorta appears unremarkable for the degree of enhancement. Small partially occlusive bilateral lower lobe segmental pulmonary emboli noted. This finding is new compared to the CT of 10/01/2016. There is no CT evidence of right heart straining Mediastinum/Nodes: No hilar or mediastinal adenopathy. The esophagus and the thyroid gland are grossly unremarkable. Lungs/Pleura: The lungs are clear. There is no pleural effusion or pneumothorax. The central airways are patent. Upper Abdomen: No acute abnormality. Musculoskeletal: Mild degenerative changes of the spine. No acute osseous pathology. Review of the MIP images confirms the above findings. IMPRESSION: 1. Small partially occlusive bilateral lower lobe pulmonary artery emboli. No CT evidence of right heart straining. 2. Mild cardiomegaly. Advanced multi vessel coronary vascular calcification. Critical Value/emergent results were called by telephone at the time of interpretation on 04/02/2017 at 3:43 am to Dr. Randal Buba, who verbally acknowledged these results. Electronically Signed  By: Anner Crete M.D.   On: 04/02/2017 03:45   Dg Foot Complete Right  Result Date: 03/11/2017 Please see detailed radiograph report in office note.    CBC  Recent Labs Lab 04/02/17 0200 04/02/17 0226 04/02/17 0624 04/03/17 0012 04/04/17 0400 04/04/17 0715  WBC 12.3*  --  14.4* 12.2* 10.7* 11.4*  HGB 10.2* 9.9* 9.8* 9.7* 9.7* 9.4*  HCT 29.8* 29.0* 28.7* 28.2* 28.6* 27.8*  PLT 309  --  285 295 276 293  MCV 93.7  --  93.8 92.2 94.4 93.3  MCH 32.1  --  32.0 31.7 32.0 31.5  MCHC 34.2  --  34.1 34.4 33.9 33.8   RDW 15.4  --  15.6* 15.4 15.8* 15.5  LYMPHSABS 2.9  --  3.3  --   --   --   MONOABS 0.9  --  1.1*  --   --   --   EOSABS 0.5  --  0.6  --   --   --   BASOSABS 0.1  --  0.1  --   --   --     Chemistries   Recent Labs Lab 04/02/17 0226 04/02/17 0624 04/04/17 0400 04/04/17 0715  NA 135 133* 135 134*  K 4.0 4.2 3.7 3.9  CL 101 100* 100* 99*  CO2  --  20* 24 25  GLUCOSE 216* 159* 121* 103*  BUN 57* 60* 40* 39*  CREATININE 9.50* 8.78* 7.19* 7.31*  CALCIUM  --  9.3 7.9* 7.9*  AST  --  19 17  --   ALT  --  17 12*  --   ALKPHOS  --  89 79  --   BILITOT  --  0.7 0.6  --    ------------------------------------------------------------------------------------------------------------------ estimated creatinine clearance is 13.4 mL/min (A) (by C-G formula based on SCr of 7.31 mg/dL (H)). ------------------------------------------------------------------------------------------------------------------  Recent Labs  04/03/17 1330  HGBA1C 7.8*   ------------------------------------------------------------------------------------------------------------------ No results for input(s): CHOL, HDL, LDLCALC, TRIG, CHOLHDL, LDLDIRECT in the last 72 hours. ------------------------------------------------------------------------------------------------------------------ No results for input(s): TSH, T4TOTAL, T3FREE, THYROIDAB in the last 72 hours.  Invalid input(s): FREET3 ------------------------------------------------------------------------------------------------------------------ No results for input(s): VITAMINB12, FOLATE, FERRITIN, TIBC, IRON, RETICCTPCT in the last 72 hours.  Coagulation profile  Recent Labs Lab 04/02/17 1429 04/03/17 0012 04/04/17 0400  INR 1.06 1.07 1.03    No results for input(s): DDIMER in the last 72 hours.  Cardiac Enzymes  Recent Labs Lab 04/02/17 0624 04/02/17 1429 04/02/17 2025  TROPONINI 0.19* 0.13* 0.13*    ------------------------------------------------------------------------------------------------------------------ Invalid input(s): POCBNP   CBG:  Recent Labs Lab 04/02/17 2034 04/03/17 0730 04/03/17 1142 04/03/17 1615 04/03/17 2113  GLUCAP 138* 99 225* 177* 174*       Studies: No results found.    Lab Results  Component Value Date   HGBA1C 7.8 (H) 04/03/2017   HGBA1C 7.1 (H) 09/29/2016   HGBA1C 7.5 07/20/2016   Lab Results  Component Value Date   MICROALBUR 209.2 (H) 11/05/2014   LDLCALC 72 09/30/2016   CREATININE 7.31 (H) 04/04/2017       Scheduled Meds: . amLODipine  5 mg Oral QHS  . cinacalcet  60 mg Oral Q breakfast  . darbepoetin (ARANESP) injection - DIALYSIS  40 mcg Intravenous Q Thu-HD  . insulin aspart  0-9 Units Subcutaneous TID WC  . lanthanum  1,000 mg Oral TID WC  . losartan  25 mg Oral QHS   Continuous Infusions: . sodium chloride    . sodium chloride    .  heparin 1,250 Units/hr (04/03/17 2233)     LOS: 1 day    Time spent: >30 MINS    Reyne Dumas  [facility] Pager [phone]. If 7PM-7AM, please contact night-coverage at [email or web address], password [facility] 04/04/2017, 10:05 AM  LOS: 1 day

## 2017-04-04 NOTE — Progress Notes (Addendum)
Patient ID: [name], male   DOB: 1961/05/20, 56 y.o.   MRN: [id number]  [facility] Progress Note   Assessment/ Plan:   1. Bilateral pulmonary embolism: Hemodynamically stable, no R heart strain. Remains on heparin drip, warfarin being started. Plan for any surgical interventions prior to INR getting therapeutic. 2. ESRD: Will continue TTS HD schedule with dialysis today. 3. HTN/volume: BP acceptable at this time, will keep current EDW/meds for now. 4. Anemia: Hgb 9.7. ESA last given 7/5. Will resume low dose Aranesp with next HD. 5. Secondary hyperparathyroidism: Ca ok, phos pending. Continue Lanthanum as binder, no VDRA for now (previously was on as outpt, recently held in setting of Ca 10.5). Continue sensipar. 6. Access issues: Has aneurysmal RUE AVF which was found to be thrombosed 2 weeks ago. Underwent failed thrombectomy (large burden of retained thrombus) at [facility] and R TDC placed at the time. On admit here, noted that the R AVF appeared patent with active thrill/bruit. Fistulogram by IR today to evaluate for stenosis and potential salvage.  7. Type 2 DM: On SSI, per primary.  Subjective:   Reports to be feeling fair his morning- no significant epistaxis   Objective:   BP 105/71   Pulse (!) 106   Temp 98.6 F (37 C) (Oral)   Resp 17   Ht 5\' 11"  (1.803 m)   Wt 94.4 kg (208 lb 1.8 oz)   SpO2 98%   BMI 29.03 kg/m   Physical Exam: BWL:[id number] resting on dialysis TLX:BWIOM RRR, normal S1 and S2 Resp:Anteriorly clear to auscultation without rales or rhonchi BTD:HRCB, flat, non tender, normal bowel sounds Ext:No LE edema, RUA AVF with thrill and bruit. Using RIJ [facility]  Labs: BMET  Recent Labs Lab 04/02/17 0226 04/02/17 0624 04/04/17 0400 04/04/17 0715  NA 135 133* 135 134*  K 4.0 4.2 3.7 3.9  CL 101 100* 100* 99*  CO2  --  20* 24 25  GLUCOSE 216* 159* 121* 103*  BUN 57* 60* 40* 39*  CREATININE 9.50* 8.78* 7.19* 7.31*  CALCIUM  --  9.3  7.9* 7.9*  PHOS  --   --   --  5.6*   CBC  Recent Labs Lab 04/02/17 0200  04/02/17 0624 04/03/17 0012 04/04/17 0400 04/04/17 0715  WBC 12.3*  --  14.4* 12.2* 10.7* 11.4*  NEUTROABS 8.0*  --  9.4*  --   --   --   HGB 10.2*  < > 9.8* 9.7* 9.7* 9.4*  HCT 29.8*  < > 28.7* 28.2* 28.6* 27.8*  MCV 93.7  --  93.8 92.2 94.4 93.3  PLT 309  --  285 295 276 293  < > = values in this interval not displayed.  Medications:    . amLODipine  5 mg Oral QHS  . cinacalcet  60 mg Oral Q breakfast  . darbepoetin (ARANESP) injection - DIALYSIS  40 mcg Intravenous Q Thu-HD  . insulin aspart  0-9 Units Subcutaneous TID WC  . lanthanum  1,000 mg Oral TID WC  . losartan  25 mg Oral QHS   Elmarie Shiley, MD 04/04/2017, 9:26 AM

## 2017-04-04 NOTE — Procedures (Signed)
Patient seen on Hemodialysis. QB 350, UF goal 3.5L Treatment adjusted as needed.  Elmarie Shiley MD [facility]. Office # [phone] Pager # [phone] 9:31 AM
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
## 2017-04-04 NOTE — Consult Note (Signed)
   [facility] CM Inpatient Consult   12/06/9751  [name] [birth year] [id number]   Referral received. Patient is currently off the unit.  Natividad Brood, RN BSN [facility] Liaison  [phone] business mobile phone Toll free office [phone]

## 2017-04-04 NOTE — Progress Notes (Signed)
ANTICOAGULATION CONSULT NOTE - Follow Up Consult  Pharmacy Consult for Heparin Indication: pulmonary embolus  Allergies  Allergen Reactions  . No Known Allergies     Patient Measurements: Height: 5\' 11"  (180.3 cm) Weight: (P) 208 lb 1.8 oz (94.4 kg) IBW/kg (Calculated) : 75.3  Vital Signs: Temp: (P) 98.6 F (37 C) (08/02 0701) Temp Source: (P) Oral (08/02 0701) BP: (P) 143/84 (08/02 0706) Pulse Rate: (P) 81 (08/02 0711)  Labs:  Recent Labs  04/02/17 0226 04/02/17 0624  04/02/17 1429 04/02/17 2025 04/03/17 0012 04/03/17 1052 04/03/17 1836 04/04/17 0400 04/04/17 0715  HGB 9.9* 9.8*  --   --   --  9.7*  --   --  9.7* 9.4*  HCT 29.0* 28.7*  --   --   --  28.2*  --   --  28.6* 27.8*  PLT  --  285  --   --   --  295  --   --  276 293  LABPROT  --   --   --  13.8  --  13.9  --   --  13.5  --   INR  --   --   --  1.06  --  1.07  --   --  1.03  --   HEPARINUNFRC  --   --   < > 0.65  --  0.72* 0.63 0.65 0.49  --   CREATININE 9.50* 8.78*  --   --   --   --   --   --  7.19*  --   TROPONINI  --  0.19*  --  0.13* 0.13*  --   --   --   --   --   < > = values in this interval not displayed.  Estimated Creatinine Clearance: 13.6 mL/min (A) (by C-G formula based on SCr of 7.19 mg/dL (H)).  Medications: Heparin @ 1250 units/hr  Assessment: 55yom continues on heparin for new bilateral PE. Heparin level remains therapeutic this morning on same rate.  Patient with thrombosis of fistula 2 weeks and had large burden of retained thrombus after attempt at thrombectomy- had TDC placed. IR is taking for fistulagram today with possible angioplasty or thrombectomy. IR asked for warfarin to be held until after procedure and warfarin order was cancelled yesterday. Patient was NOT given warfarin on 7/31 despite it being ordered for that evening. Therefore, when warfarin starts it will be day #1 of required 5 days of overlap.  Hgb 9.7, plts nml. Patient is due for Aranesp today.  Goal of  Therapy:  Heparin level 0.3-0.7 INR 2-3 Monitor platelets by anticoagulation protocol: Yes   Plan:  -Continue heparin at 1250 units/hr -Daily heparin level, INR, and CBC -Follow IR for ability to start warfarin  Guillermo Difrancesco D. Traven Davids, PharmD, BCPS Clinical Pharmacist Pager: [phone] Clinical Phone for 04/04/2017 until 3:30pm: [phone] If after 3:30pm, please call main pharmacy at [phone] 04/04/2017 7:31 AM

## 2017-04-05 ENCOUNTER — Telehealth: Payer: Self-pay

## 2017-04-05 LAB — CBC
HEMATOCRIT: 30.7 % — AB (ref 39.0–52.0)
HEMOGLOBIN: 10.2 g/dL — AB (ref 13.0–17.0)
MCH: 31.5 pg (ref 26.0–34.0)
MCHC: 33.2 g/dL (ref 30.0–36.0)
MCV: 94.8 fL (ref 78.0–100.0)
PLATELETS: 308 10*3/uL (ref 150–400)
RBC: 3.24 MIL/uL — AB (ref 4.22–5.81)
RDW: 15.7 % — ABNORMAL HIGH (ref 11.5–15.5)
WBC: 13 10*3/uL — AB (ref 4.0–10.5)

## 2017-04-05 LAB — PROTIME-INR
INR: 1
Prothrombin Time: 13.2 seconds (ref 11.4–15.2)

## 2017-04-05 LAB — GLUCOSE, CAPILLARY
GLUCOSE-CAPILLARY: 158 mg/dL — AB (ref 65–99)
Glucose-Capillary: 136 mg/dL — ABNORMAL HIGH (ref 65–99)
Glucose-Capillary: 175 mg/dL — ABNORMAL HIGH (ref 65–99)
Glucose-Capillary: 177 mg/dL — ABNORMAL HIGH (ref 65–99)

## 2017-04-05 LAB — HEPARIN LEVEL (UNFRACTIONATED): Heparin Unfractionated: 0.5 IU/mL (ref 0.30–0.70)

## 2017-04-05 MED ORDER — COUMADIN BOOK
Freq: Once | Status: AC
  Administered 2017-04-05: 13:00:00
  Filled 2017-04-05: qty 1

## 2017-04-05 MED ORDER — RENA-VITE PO TABS
1.0000 | ORAL_TABLET | Freq: Every day | ORAL | Status: DC
  Administered 2017-04-05 – 2017-04-11 (×7): 1 via ORAL
  Filled 2017-04-05 (×7): qty 1

## 2017-04-05 MED ORDER — NEPRO/CARBSTEADY PO LIQD
237.0000 mL | Freq: Two times a day (BID) | ORAL | Status: DC
  Administered 2017-04-05 – 2017-04-11 (×6): 237 mL via ORAL

## 2017-04-05 MED ORDER — WARFARIN VIDEO
Freq: Once | Status: AC
Start: 2017-04-05 — End: 2017-04-08
  Administered 2017-04-08: 20:00:00

## 2017-04-05 MED ORDER — WARFARIN - PHARMACIST DOSING INPATIENT
Freq: Every day | Status: DC
  Administered 2017-04-09: 18:00:00

## 2017-04-05 MED ORDER — DOXERCALCIFEROL 4 MCG/2ML IV SOLN
2.0000 ug | INTRAVENOUS | Status: DC
  Administered 2017-04-06 – 2017-04-11 (×3): 2 ug via INTRAVENOUS
  Filled 2017-04-05 (×3): qty 2

## 2017-04-05 MED ORDER — WARFARIN SODIUM 7.5 MG PO TABS
7.5000 mg | ORAL_TABLET | Freq: Once | ORAL | Status: AC
  Administered 2017-04-05: 7.5 mg via ORAL
  Filled 2017-04-05: qty 3

## 2017-04-05 NOTE — Consult Note (Signed)
   [facility] CM Inpatient Consult   12/03/2818  [name] [birth year] [id number]  Referral from [facility], [place] [facility]. Patient evaluated for community based chronic disease management services with [place] [facility] as a benefit of patient's [employer]. Spoke with patient at bedside to explain [facility] services. Patient admitted with bilateral pulmonary emboli, CKD on dialysis at [facility] on TTS schedule, and Type 2 Diabetes. Patient states he is currently at [employer] due to not living with significant other at this time.  He endorses his phone number is [phone].  Patient states he is working with the Education officer, museum at the shelter, uses [facility] for transportation, and so far able to get his medications. Patient will need to make a follow up with a new primary care provider at [facility].   Patient will receive post hospital discharge call and will be evaluated for monthly home visits for assessments and disease process education.  Left contact information and [facility] literature at bedside.  Of note, [facility] Management services does not replace or interfere with any services that are arranged by inpatient case management or social work.  For additional questions or referrals please contact:    Natividad Brood, RN BSN [facility] Liaison  [phone] business mobile phone Toll free office [phone]

## 2017-04-05 NOTE — Progress Notes (Signed)
ANTICOAGULATION CONSULT NOTE - Follow Up Consult  Pharmacy Consult for Heparin + Warfarin  Indication: B/L pulmonary emboli + RLE DVT  Allergies  Allergen Reactions  . No Known Allergies     Patient Measurements: Height: 5\' 11"  (180.3 cm) Weight: 203 lb 12.8 oz (92.4 kg) IBW/kg (Calculated) : 75.3  Vital Signs: Temp: 98.7 F (37.1 C) (08/03 0910) Temp Source: Oral (08/03 0910) BP: 122/64 (08/03 0910) Pulse Rate: 98 (08/03 0910)  Labs:  Recent Labs  04/02/17 1429 04/02/17 2025  04/03/17 0012  04/03/17 1836 04/04/17 0400 04/04/17 0715 04/05/17 0513  HGB  --   --   < > 9.7*  --   --  9.7* 9.4* 10.2*  HCT  --   --   < > 28.2*  --   --  28.6* 27.8* 30.7*  PLT  --   --   < > 295  --   --  276 293 308  LABPROT 13.8  --   --  13.9  --   --  13.5  --  13.2  INR 1.06  --   --  1.07  --   --  1.03  --  1.00  HEPARINUNFRC 0.65  --   --  0.72*  < > 0.65 0.49  --  0.50  CREATININE  --   --   --   --   --   --  7.19* 7.31*  --   TROPONINI 0.13* 0.13*  --   --   --   --   --   --   --   < > = values in this interval not displayed.  Estimated Creatinine Clearance: 13.3 mL/min (A) (by C-G formula based on SCr of 7.31 mg/dL (H)).  Medications: Heparin @ 1250 units/hr (12.5 ml/hr)  Assessment: [name] on heparin for new bilateral PE and RLE DVT. Warfarin initiation was delayed due to fistulogram by [facility] on 8/2 which showed a patent fistula. Renal okay with starting warfarin doses this evening.   Heparin level this morning is therapeutic (HL 0.5, goal of 0.3-0.7). Baseline INR is 1 as the patient has yet to receive any warfarin doses. Today will be VTE overlap D#1/5 of the 5 day overlap as recommended by the CHEST guidelines.   The patient was educated on warfarin today.  Goal of Therapy:  Heparin level 0.3-0.7 INR 2-3 Monitor platelets by anticoagulation protocol: Yes   Plan:  1. Continue Heparin at 1250 units/hr (12.5 ml/hr) 2. Warfarin 7.5 mg x 1 dose at 1800 today 3. Will  continue to monitor for any signs/symptoms of bleeding and will follow up with heparin level and PT/INR in the a.m.   Thank you for allowing pharmacy to be a part of this patient's care.  Alycia Rossetti, PharmD, BCPS Clinical Pharmacist Pager: [phone] Clinical phone for 04/05/2017 from 7a-3:30p: [phone] If after 3:30p, please call main pharmacy at: [phone] 04/05/2017 10:33 AM

## 2017-04-05 NOTE — Progress Notes (Signed)
[facility] Progress Note   Subjective:  No C/Os. Discussed attempting to use AVF for HD tomorrow. Discussed continued course with anticoagulation for PE/DVT/clot on [facility].   Objective Vitals:   04/04/17 1114 04/04/17 1828 04/04/17 2022 04/05/17 0518  BP: 110/69 109/74 124/80 118/72  Pulse: 91 100 96 98  Resp: 20 20 19 16   Temp: 97.9 F (36.6 C) 97.8 F (36.6 C) 98.8 F (37.1 C) 98.6 F (37 C)  TempSrc: Oral Oral Oral Oral  SpO2: 98% 98% 99% 96%  Weight: 91 kg (200 lb 9.9 oz)  92.4 kg (203 lb 12.8 oz)   Height:       Physical Exam General: WNWD Nad Heart: S1,S2, No M/R/G Lungs: CTAB A/P Abdomen: Active BS Extremities: No LE edema Dialysis Access: LUA AVF + bruit. RIJ TDC drsg CDI   Additional Objective Labs: Basic Metabolic Panel:  Recent Labs Lab 04/02/17 0624 04/04/17 0400 04/04/17 0715  NA 133* 135 134*  K 4.2 3.7 3.9  CL 100* 100* 99*  CO2 20* 24 25  GLUCOSE 159* 121* 103*  BUN 60* 40* 39*  CREATININE 8.78* 7.19* 7.31*  CALCIUM 9.3 7.9* 7.9*  PHOS  --   --  5.6*   Liver Function Tests:  Recent Labs Lab 04/02/17 0624 04/04/17 0400 04/04/17 0715  AST 19 17  --   ALT 17 12*  --   ALKPHOS 89 79  --   BILITOT 0.7 0.6  --   PROT 7.3 6.9  --   ALBUMIN 3.5 3.3* 3.3*   No results for input(s): LIPASE, AMYLASE in the last 168 hours. CBC:  Recent Labs Lab 04/02/17 0200  04/02/17 0624 04/03/17 0012 04/04/17 0400 04/04/17 0715 04/05/17 0513  WBC 12.3*  --  14.4* 12.2* 10.7* 11.4* 13.0*  NEUTROABS 8.0*  --  9.4*  --   --   --   --   HGB 10.2*  < > 9.8* 9.7* 9.7* 9.4* 10.2*  HCT 29.8*  < > 28.7* 28.2* 28.6* 27.8* 30.7*  MCV 93.7  --  93.8 92.2 94.4 93.3 94.8  PLT 309  --  285 295 276 293 308  < > = values in this interval not displayed. Blood Culture    Component Value Date/Time   SDES BLOOD LEFT ANTECUBITAL 09/29/2016 0950   SPECREQUEST BOTTLES DRAWN AEROBIC AND ANAEROBIC  5CC 09/29/2016 0950   CULT NO GROWTH 5 DAYS 09/29/2016  0950   REPTSTATUS 10/04/2016 FINAL 09/29/2016 0950    Cardiac Enzymes:  Recent Labs Lab 04/02/17 0624 04/02/17 1429 04/02/17 2025  TROPONINI 0.19* 0.13* 0.13*   CBG:  Recent Labs Lab 04/03/17 2113 04/04/17 1202 04/04/17 1610 04/04/17 2128 04/05/17 0747  GLUCAP 174* 101* 235* 129* 136*   Iron Studies: No results for input(s): IRON, TIBC, TRANSFERRIN, FERRITIN in the last 72 hours. @lablastinr3 @ Studies/Results: Ir Dialy Shunt Intro Needle/intracath Initial W/img Right  Result Date: 04/04/2017 INDICATION: Evaluate for venous stenosis in the right upper extremity. Patient has a right upper arm fistula. EXAM: RIGHT UPPER EXTREMITY FISTULOGRAM MEDICATIONS: None. ANESTHESIA/SEDATION: None FLUOROSCOPY TIME:  Fluoroscopy Time: 32 seconds, 15 mGy CONTRAST:  32 ml [id number] COMPLICATIONS: None immediate. PROCEDURE: The right upper extremity fistula was accessed with an Angiocath. Series of fistulogram images were obtained. Catheter was removed with manual compression. FINDINGS: The fistula was accessed just above the elbow. The draining vein is mild irregularity and mildly aneurysmal near the access site. However, there is no significant stenosis. Central veins are patent. Arterial anastomosis  is patent. Right jugular dialysis catheter extending into the right atrium. IMPRESSION: Right upper arm fistula is patent without significant stenosis. 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 ACCESS: This access remains amenable to future percutaneous interventions as clinically indicated. Electronically Signed   By: Markus Daft M.D.   On: 04/04/2017 16:26   Medications: . sodium chloride    . sodium chloride    . heparin 1,250 Units/hr (04/05/17 0655)   . amLODipine  5 mg Oral QHS  . cinacalcet  60 mg Oral Q breakfast  . darbepoetin (ARANESP) injection - DIALYSIS  40 mcg Intravenous Q Thu-HD  . insulin aspart  0-9 Units Subcutaneous TID WC  . lanthanum  1,000 mg Oral  TID WC  . losartan  25 mg Oral QHS   HD orders: [place] T,Th, S 4 hrs 180 NRe 90 kg  400/800 2.0 K/ 2.5 Ca  Heparin 6000 units IV TIW  Mircera 50 mcg IV Q 2 week (last dose 03/28/17) Venofer 50 mg IV weekly (last dose 03/28/17)  Assessment/Plan: 1.  Bilateral Pulmonary Emboli: Also has DVT, thrombus on dialysis catheter. Cardiology consulted per primary. Will need long term anticoagulation. Has RA mass- Low suspicion for vegetation. No TEE planned. Anticoagulation per pharmacy. Continues on heparin gtt.  2. ESRD -Continue T,Th, S. Next HD 04/06/17. No heparin-on heparin gtt. K+ 3.9 use 3.0 K bathj 3. Anemia - HGB 10.2. Rec'd Aranesp 40 mcg IV 04/04/17. Follow HGB, cont weekly ESA.  4. Secondary hyperparathyroidism - Ca 7.9 C Ca 8.5 Phos 5.6. Continue binders, sensipar-watch Ca while on sensipar. VDRA has been on hold. Will resume. Hectoral 2 mcg IV TIW.  5. HTN/volume - HD yesterday Pre wt 94.4 Net UF 3.5 Post wt 91 kg. Remain 1 kg above OP EDW but was leaving above EDW at OP center. Consider raising 0.5 kg on DC. BP well controlled. Cont Amlodipine and losartan.  6. Nutrition - Albumin 3.3 Renal/Carb mod diet. Add renal vit and nepro.  7. AV Access: RUA AVF patent with mild irregularities per shuntogram in IR 04/04/17. Consider removal of TDC if this can be done without dislodging thrombus (doubtful). Pt agrees to use AVF for HD tomorrow.  8. DM: per primary  Rita H. Brown NP-C 04/05/2017, 9:13 AM  [employer] [phone]

## 2017-04-05 NOTE — Telephone Encounter (Signed)
Message received from Andria Meuse, RN CM informing this CM that the patient will need a hospital follow up appointment. At this time, there are no hospital follow up appointments available, the patient will need to call the clinic for an appointment. This information will be placed on the AVS.  Voicemail message left for Jasmine Pang, RN CM with the update.

## 2017-04-05 NOTE — Discharge Instructions (Addendum)
Information on my medicine - Coumadin   (Warfarin)  This medication education was reviewed with me or my healthcare representative as part of my discharge preparation.    Why was Coumadin prescribed for you? Coumadin was prescribed for you because you have a blood clot or a medical condition that can cause an increased risk of forming blood clots. Blood clots can cause serious health problems by blocking the flow of blood to the heart, lung, or brain. Coumadin can prevent harmful blood clots from forming. As a reminder your indication for Coumadin is:   Pulmonary Embolism Treatment  What test will check on my response to Coumadin? While on Coumadin (warfarin) you will need to have an INR test regularly to ensure that your dose is keeping you in the desired range. The INR (international normalized ratio) number is calculated from the result of the laboratory test called prothrombin time (PT).  If an INR APPOINTMENT HAS NOT ALREADY BEEN MADE FOR YOU please schedule an appointment to have this lab work done by your health care provider within 7 days. Your INR goal is usually a number between:  2 to 3 or your provider may give you a more narrow range like 2-2.5.  Ask your health care provider during an office visit what your goal INR is.  What  do you need to  know  About  COUMADIN? Take Coumadin (warfarin) exactly as prescribed by your healthcare provider about the same time each day.  DO NOT stop taking without talking to the doctor who prescribed the medication.  Stopping without other blood clot prevention medication to take the place of Coumadin may increase your risk of developing a new clot or stroke.  Get refills before you run out.  What do you do if you miss a dose? If you miss a dose, take it as soon as you remember on the same day then continue your regularly scheduled regimen the next day.  Do not take two doses of Coumadin at the same time.  Important Safety Information A possible  side effect of Coumadin (Warfarin) is an increased risk of bleeding. You should call your healthcare provider right away if you experience any of the following: ? Bleeding from an injury or your nose that does not stop. ? Unusual colored urine (red or dark brown) or unusual colored stools (red or black). ? Unusual bruising for unknown reasons. ? A serious fall or if you hit your head (even if there is no bleeding).  Some foods or medicines interact with Coumadin (warfarin) and might alter your response to warfarin. To help avoid this: ? Eat a balanced diet, maintaining a consistent amount of Vitamin K. ? Notify your provider about major diet changes you plan to make. ? Avoid alcohol or limit your intake to 1 drink for women and 2 drinks for men per day. (1 drink is 5 oz. wine, 12 oz. beer, or 1.5 oz. liquor.)  Make sure that ANY health care provider who prescribes medication for you knows that you are taking Coumadin (warfarin).  Also make sure the healthcare provider who is monitoring your Coumadin knows when you have started a new medication including herbals and non-prescription products.  Coumadin (Warfarin)  Major Drug Interactions  Increased Warfarin Effect Decreased Warfarin Effect  Alcohol (large quantities) Antibiotics (esp. Septra/Bactrim, Flagyl, Cipro) Amiodarone (Cordarone) Aspirin (ASA) Cimetidine (Tagamet) Megestrol (Megace) NSAIDs (ibuprofen, naproxen, etc.) Piroxicam (Feldene) Propafenone (Rythmol SR) Propranolol (Inderal) Isoniazid (INH) Posaconazole (Noxafil) Barbiturates (Phenobarbital) Carbamazepine (Tegretol) Chlordiazepoxide (  Librium) Cholestyramine (Questran) Griseofulvin Oral Contraceptives Rifampin Sucralfate (Carafate) Vitamin K   Coumadin (Warfarin) Major Herbal Interactions  Increased Warfarin Effect Decreased Warfarin Effect  Garlic Ginseng Ginkgo biloba Coenzyme Q10 Green tea St. Johns wort    Coumadin (Warfarin) FOOD Interactions  Eat  a consistent number of servings per week of foods HIGH in Vitamin K (1 serving =  cup)  Collards (cooked, or boiled & drained) Kale (cooked, or boiled & drained) Mustard greens (cooked, or boiled & drained) Parsley *serving size only =  cup Spinach (cooked, or boiled & drained) Swiss chard (cooked, or boiled & drained) Turnip greens (cooked, or boiled & drained)  Eat a consistent number of servings per week of foods MEDIUM-HIGH in Vitamin K (1 serving = 1 cup)  Asparagus (cooked, or boiled & drained) Broccoli (cooked, boiled & drained, or raw & chopped) Brussel sprouts (cooked, or boiled & drained) *serving size only =  cup Lettuce, raw (green leaf, endive, romaine) Spinach, raw Turnip greens, raw & chopped   These websites have more information on Coumadin (warfarin):  FailFactory.se; VeganReport.com.au;    Additional discharge instructions:  Please get your medications reviewed and adjusted by your Primary MD.  Please request your Primary MD to go over all Hospital Tests and Procedure/Radiological results at the follow up, please get all Hospital records sent to your Prim MD by signing hospital release before you go home.  If you had Pneumonia of Lung problems at the Hospital: Please get a 2 view Chest X ray done in 6-8 weeks after hospital discharge or sooner if instructed by your Primary MD.  If you have Congestive Heart Failure: Please call your Cardiologist or Primary MD anytime you have any of the following symptoms:  1) 3 pound weight gain in 24 hours or 5 pounds in 1 week  2) shortness of breath, with or without a dry hacking cough  3) swelling in the hands, feet or stomach  4) if you have to sleep on extra pillows at night in order to breathe  Follow cardiac low salt diet and 1.5 lit/day fluid restriction.  If you have diabetes Accuchecks 4 times/day, Once in AM empty stomach and then before each meal. Log in all results and show them to your  primary doctor at your next visit. If any glucose reading is under 80 or above 300 call your primary MD immediately.  If you have Seizure/Convulsions/Epilepsy: Please do not drive, operate heavy machinery, participate in activities at heights or participate in high speed sports until you have seen by Primary MD or a Neurologist and advised to do so again.  If you had Gastrointestinal Bleeding: Please ask your Primary MD to check a complete blood count within one week of discharge or at your next visit. Your endoscopic/colonoscopic biopsies that are pending at the time of discharge, will also need to followed by your Primary MD.  Get Medicines reviewed and adjusted. Please take all your medications with you for your next visit with your Primary MD  Please request your Primary MD to go over all hospital tests and procedure/radiological results at the follow up, please ask your Primary MD to get all Hospital records sent to his/her office.  If you experience worsening of your admission symptoms, develop shortness of breath, life threatening emergency, suicidal or homicidal thoughts you must seek medical attention immediately by calling 911 or calling your MD immediately  if symptoms less severe.  You must read complete instructions/literature along with all the possible  adverse reactions/side effects for all the Medicines you take and that have been prescribed to you. Take any new Medicines after you have completely understood and accpet all the possible adverse reactions/side effects.   Do not drive or operate heavy machinery when taking Pain medications.   Do not take more than prescribed Pain, Sleep and Anxiety Medications  Special Instructions: If you have smoked or chewed Tobacco  in the last 2 yrs please stop smoking, stop any regular Alcohol  and or any Recreational drug use.  Wear Seat belts while driving.  Please note You were cared for by a hospitalist during your hospital stay. If  you have any questions about your discharge medications or the care you received while you were in the hospital after you are discharged, you can call the unit and asked to speak with the hospitalist on call if the hospitalist that took care of you is not available. Once you are discharged, your primary care physician will handle any further medical issues. Please note that NO REFILLS for any discharge medications will be authorized once you are discharged, as it is imperative that you return to your primary care physician (or establish a relationship with a primary care physician if you do not have one) for your aftercare needs so that they can reassess your need for medications and monitor your lab values.  You can reach the hospitalist office at phone [phone] or fax [phone]   If you do not have a primary care physician, you can call [phone] for a physician referral.

## 2017-04-05 NOTE — Progress Notes (Signed)
[facility] PROGRESS NOTE  [name] URK:[id number] DOB: 1961/04/10 DOA: 04/02/2017   PCP: [name], MD     Assessment/Plan: Principal Problem:   Pulmonary embolism ([place]) Active Problems:   Essential hypertension   Non-insulin-dependent diabetes mellitus with renal complications ([place])   End stage renal disease on dialysis ([place])   ESRD on dialysis ([place])   Left atrial mass   56 year old man of Hispanic origin with past medical history significant for hypertension, type 2 diabetes mellitus and end-stage renal disease on hemodialysis who presents with sudden onset of pleuritic type chest pain that started last night. CT scan of the chest showed small partially occlusive bilateral lower lobe pulmonary artery emboli with no evidence of right heart strain for which is being admitted for inpatient management.   venous Dopplers of the lower extremities  showed DVT. Patient recently underwent failed thrombectomy of right brachiocephalic fistula because of large retained thrombus , status post right IJ tunneled hemodialysis catheter through which he has been getting his dialysis.   Assessment and plan . Bilateral Pulmonary embolism ([place]): Likely secondary to recent surgery. He is a dialysis, we will need to be on Coumadin, with heparin bridge. 2-D echo  shows normal RV size and systolic function, markings noted in the right atrium, likely dialysis catheter tip with thrombus versus vegetation. Called  cardiology to see if TEE needed , patient is already on anticoagulation, no indication for TEE , low suspicion for endocarditis.  Dc when INR therapeutic , as per pharmacy 8/3 will be counted as day 1 of overlap   . Essential hypertension: Continue Cozaar and Norvasc  End-stage renal disease on hemodialysis: Nephrology following . Patient had a failed thrombectomy(large retained thrombus burden in the aneurysms) of the right brachiocephalic fistula 2 weeks ago.  Nephrology placed right IJ tunneled HD catheter through which has been getting his dialysis. Fistulogram by IR today to evaluate for stenosis and potential salvage , was WNL  ,   , discussed  with Dr. Posey Pronto, no  surgical interventions planned at this time    . Non-insulin-dependent diabetes mellitus with renal complications ([place]): Uncontrolled CBGs. Most recent hemoglobin A1c 7.8. Does not take any medications for diabetes at home. Continue SSI  Leukocytosis: Likely stress margination: Continue to follow.  Elevated troponins: Mild. Given setting of end-stage renal disease plus acute PE, false elevation. No evidence at this time to think of acute ischemia.     DVT prophylaxsis heparin/Coumadin  Code Status:     Family Communication: Discussed in detail with the patient, all imaging results, lab results explained to the patient   Disposition Plan:  As per nephrology , pending INR to be therapeutic with 5 days of overlap      Consultants:  Nephrology  Vascular    Procedures:  Fistulogram  Antibiotics: Anti-infectives    None         HPI/Subjective:  Denies any chest pain shortness of breath,   Objective: Vitals:   04/05/17 0518 04/05/17 0910 04/05/17 1219 04/05/17 1258  BP: 118/72 122/64 (!) 112/54   Pulse: 98 98 99   Resp: 16 16    Temp: 98.6 F (37 C) 98.7 F (37.1 C)  98.6 F (37 C)  TempSrc: Oral Oral  Oral  SpO2: 96% 98%    Weight:      Height:        Intake/Output Summary (Last 24 hours) at 04/05/17 1301 Last data filed at 04/05/17 8315  Gross per 24 hour  Intake           879.58 ml  Output              350 ml  Net           529.58 ml    Exam:  Examination:  General exam: Appears calm and comfortable  Respiratory system: Clear to auscultation. Respiratory effort normal. Cardiovascular system: S1 & S2 heard, RRR. No JVD, murmurs, rubs, gallops or clicks. No pedal edema. Gastrointestinal system: Abdomen is nondistended, soft and nontender.  No organomegaly or masses felt. Normal bowel sounds heard. Central nervous system: Alert and oriented. No focal neurological deficits. Extremities: Symmetric 5 x 5 power. Skin: No rashes, lesions or ulcers Psychiatry: Judgement and insight appear normal. Mood & affect appropriate.     Data Reviewed: I have personally reviewed following labs and imaging studies  Micro Results Recent Results (from the past 240 hour(s))  MRSA PCR Screening     Status: None   Collection Time: 04/02/17  9:02 PM  Result Value Ref Range Status   MRSA by PCR NEGATIVE NEGATIVE Final    Comment:        The GeneXpert MRSA Assay (FDA approved for NASAL specimens only), is one component of a comprehensive MRSA colonization surveillance program. It is not intended to diagnose MRSA infection nor to guide or monitor treatment for MRSA infections.     Radiology Reports Dg Chest 2 View  Result Date: 04/02/2017 CLINICAL DATA:  56 year old male with centralized chest pain. EXAM: CHEST  2 VIEW COMPARISON:  Chest radiograph dated 02/05/2017 FINDINGS: Right IJ dialysis catheter with tip over right atrium. There is top-normal cardiac size. No vascular congestion or edema. Minimal right lung base atelectatic changes noted. No focal consolidation, pleural effusion, or pneumothorax. No acute osseous pathology. IMPRESSION: 1. No acute cardiopulmonary process. Minimal right lung base atelectatic changes. 2. Top-normal cardiac size.  No vascular congestion or edema. 3. Minimal right lung base atelectatic changes. No focal consolidation. Electronically Signed   By: Anner Crete M.D.   On: 04/02/2017 02:23   Ct Angio Chest Pe W And/or Wo Contrast  Result Date: 04/02/2017 CLINICAL DATA:  56 year old male with history of diabetes and CHF presenting with chest pain. EXAM: CT ANGIOGRAPHY CHEST WITH CONTRAST TECHNIQUE: Multidetector CT imaging of the chest was performed using the standard protocol during bolus administration of  intravenous contrast. Multiplanar CT image reconstructions and MIPs were obtained to evaluate the vascular anatomy. CONTRAST:  100 cc Isovue 370 COMPARISON:  Chest radiograph dated 04/02/2017 chest CT dated 10/01/2016 FINDINGS: Cardiovascular: There is mild cardiomegaly. No pericardial effusion. There is multi vessel coronary vascular calcification involving the left main, lad, RCA, and left circumflex artery. A right IJ dialysis catheter is seen with tip in the right atrium. The thoracic aorta appears unremarkable for the degree of enhancement. Small partially occlusive bilateral lower lobe segmental pulmonary emboli noted. This finding is new compared to the CT of 10/01/2016. There is no CT evidence of right heart straining Mediastinum/Nodes: No hilar or mediastinal adenopathy. The esophagus and the thyroid gland are grossly unremarkable. Lungs/Pleura: The lungs are clear. There is no pleural effusion or pneumothorax. The central airways are patent. Upper Abdomen: No acute abnormality. Musculoskeletal: Mild degenerative changes of the spine. No acute osseous pathology. Review of the MIP images confirms the above findings. IMPRESSION: 1. Small partially occlusive bilateral lower lobe pulmonary artery emboli. No CT evidence of right heart straining. 2. Mild  cardiomegaly. Advanced multi vessel coronary vascular calcification. Critical Value/emergent results were called by telephone at the time of interpretation on 04/02/2017 at 3:43 am to Dr. Randal Buba, who verbally acknowledged these results. Electronically Signed   By: Anner Crete M.D.   On: 04/02/2017 03:45   Dg Foot Complete Right  Result Date: 03/11/2017 Please see detailed radiograph report in office note.  Ir Dialy Shunt Intro Needle/intracath Initial W/img Right  Result Date: 04/04/2017 INDICATION: Evaluate for venous stenosis in the right upper extremity. Patient has a right upper arm fistula. EXAM: RIGHT UPPER EXTREMITY FISTULOGRAM MEDICATIONS:  None. ANESTHESIA/SEDATION: None FLUOROSCOPY TIME:  Fluoroscopy Time: 32 seconds, 15 mGy CONTRAST:  32 ml [id number] COMPLICATIONS: None immediate. PROCEDURE: The right upper extremity fistula was accessed with an Angiocath. Series of fistulogram images were obtained. Catheter was removed with manual compression. FINDINGS: The fistula was accessed just above the elbow. The draining vein is mild irregularity and mildly aneurysmal near the access site. However, there is no significant stenosis. Central veins are patent. Arterial anastomosis is patent. Right jugular dialysis catheter extending into the right atrium. IMPRESSION: Right upper arm fistula is patent without significant stenosis. 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 ACCESS: This access remains amenable to future percutaneous interventions as clinically indicated. Electronically Signed   By: Markus Daft M.D.   On: 04/04/2017 16:26     CBC  Recent Labs Lab 04/02/17 0200  04/02/17 0624 04/03/17 0012 04/04/17 0400 04/04/17 0715 04/05/17 0513  WBC 12.3*  --  14.4* 12.2* 10.7* 11.4* 13.0*  HGB 10.2*  < > 9.8* 9.7* 9.7* 9.4* 10.2*  HCT 29.8*  < > 28.7* 28.2* 28.6* 27.8* 30.7*  PLT 309  --  285 295 276 293 308  MCV 93.7  --  93.8 92.2 94.4 93.3 94.8  MCH 32.1  --  32.0 31.7 32.0 31.5 31.5  MCHC 34.2  --  34.1 34.4 33.9 33.8 33.2  RDW 15.4  --  15.6* 15.4 15.8* 15.5 15.7*  LYMPHSABS 2.9  --  3.3  --   --   --   --   MONOABS 0.9  --  1.1*  --   --   --   --   EOSABS 0.5  --  0.6  --   --   --   --   BASOSABS 0.1  --  0.1  --   --   --   --   < > = values in this interval not displayed.  Chemistries   Recent Labs Lab 04/02/17 0226 04/02/17 0624 04/04/17 0400 04/04/17 0715  NA 135 133* 135 134*  K 4.0 4.2 3.7 3.9  CL 101 100* 100* 99*  CO2  --  20* 24 25  GLUCOSE 216* 159* 121* 103*  BUN 57* 60* 40* 39*  CREATININE 9.50* 8.78* 7.19* 7.31*  CALCIUM  --  9.3 7.9* 7.9*  AST  --  19 17  --   ALT  --   17 12*  --   ALKPHOS  --  89 79  --   BILITOT  --  0.7 0.6  --    ------------------------------------------------------------------------------------------------------------------ estimated creatinine clearance is 13.3 mL/min (A) (by C-G formula based on SCr of 7.31 mg/dL (H)). ------------------------------------------------------------------------------------------------------------------  Recent Labs  04/03/17 1330  HGBA1C 7.8*   ------------------------------------------------------------------------------------------------------------------ No results for input(s): CHOL, HDL, LDLCALC, TRIG, CHOLHDL, LDLDIRECT in the last 72 hours. ------------------------------------------------------------------------------------------------------------------ No results for input(s): TSH, T4TOTAL, T3FREE, THYROIDAB in the last  72 hours.  Invalid input(s): FREET3 ------------------------------------------------------------------------------------------------------------------ No results for input(s): VITAMINB12, FOLATE, FERRITIN, TIBC, IRON, RETICCTPCT in the last 72 hours.  Coagulation profile  Recent Labs Lab 04/02/17 1429 04/03/17 0012 04/04/17 0400 04/05/17 0513  INR 1.06 1.07 1.03 1.00    No results for input(s): DDIMER in the last 72 hours.  Cardiac Enzymes  Recent Labs Lab 04/02/17 0624 04/02/17 1429 04/02/17 2025  TROPONINI 0.19* 0.13* 0.13*   ------------------------------------------------------------------------------------------------------------------ Invalid input(s): POCBNP   CBG:  Recent Labs Lab 04/04/17 1202 04/04/17 1610 04/04/17 2128 04/05/17 0747 04/05/17 1211  GLUCAP 101* 235* 129* 136* 175*       Studies: Ir Dialy Shunt Intro Needle/intracath Initial W/img Right  Result Date: 04/04/2017 INDICATION: Evaluate for venous stenosis in the right upper extremity. Patient has a right upper arm fistula. EXAM: RIGHT UPPER EXTREMITY FISTULOGRAM  MEDICATIONS: None. ANESTHESIA/SEDATION: None FLUOROSCOPY TIME:  Fluoroscopy Time: 32 seconds, 15 mGy CONTRAST:  32 ml [id number] COMPLICATIONS: None immediate. PROCEDURE: The right upper extremity fistula was accessed with an Angiocath. Series of fistulogram images were obtained. Catheter was removed with manual compression. FINDINGS: The fistula was accessed just above the elbow. The draining vein is mild irregularity and mildly aneurysmal near the access site. However, there is no significant stenosis. Central veins are patent. Arterial anastomosis is patent. Right jugular dialysis catheter extending into the right atrium. IMPRESSION: Right upper arm fistula is patent without significant stenosis. 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 ACCESS: This access remains amenable to future percutaneous interventions as clinically indicated. Electronically Signed   By: Markus Daft M.D.   On: 04/04/2017 16:26      Lab Results  Component Value Date   HGBA1C 7.8 (H) 04/03/2017   HGBA1C 7.1 (H) 09/29/2016   HGBA1C 7.5 07/20/2016   Lab Results  Component Value Date   MICROALBUR 209.2 (H) 11/05/2014   LDLCALC 72 09/30/2016   CREATININE 7.31 (H) 04/04/2017       Scheduled Meds: . amLODipine  5 mg Oral QHS  . cinacalcet  60 mg Oral Q breakfast  . darbepoetin (ARANESP) injection - DIALYSIS  40 mcg Intravenous Q Thu-HD  . [START ON 04/06/2017] doxercalciferol  2 mcg Intravenous Q T,Th,Sa-HD  . feeding supplement (NEPRO CARB STEADY)  237 mL Oral BID BM  . insulin aspart  0-9 Units Subcutaneous TID WC  . lanthanum  1,000 mg Oral TID WC  . losartan  25 mg Oral QHS  . multivitamin  1 tablet Oral QHS  . warfarin  7.5 mg Oral ONCE-1800  . warfarin   Does not apply Once  . Warfarin - Pharmacist Dosing Inpatient   Does not apply q1800   Continuous Infusions: . sodium chloride    . sodium chloride    . heparin 1,250 Units/hr (04/05/17 0655)     LOS: 2 days    Time spent:  >30 MINS    Reyne Dumas  [facility] Pager [phone]. If 7PM-7AM, please contact night-coverage at [email or web address], password [facility] 04/05/2017, 1:01 PM  LOS: 2 days

## 2017-04-05 NOTE — Care Management Note (Signed)
Case Management Note  Patient Details  Name: [name] MRN: [id number] Date of Birth: [birth year]  Subjective/Objective:    Pt admitted on 04/05/17 with pulmonary emboli.  PTA, pt independent, he has been residing at [facility].  He is on HD Tues/Thurs/ Sat, and goes to OP dialysis at [facility].  PCP is [place]; pt states he has a new PCP assigned to him, but cannot remember name.               Action/Plan: Met with pt to discuss discharge planning.  Pt states he has called the shelter, and they are saving his bed.  He does have transportation to dialysis, but states he may need a bus pass at discharge to get to shelter.  Please notify CSW, if needed.  Case manager will arrange OP appointment with [facility] upon discharge.    Expected Discharge Date:                  Expected Discharge Plan:  Home/Self Care  In-House Referral:  Clinical Social Work  Discharge planning Services  CM Consult, [facility]  Post Acute Care Choice:    Choice offered to:     DME Arranged:    DME Agency:     HH Arranged:    [place] Agency:  [place]  Status of Service:  In process, will continue to follow  If discussed at Long Length of Stay Meetings, dates discussed:    Additional Comments:  Reinaldo Raddle, RN, BSN  Trauma/Neuro ICU Case Manager [phone]

## 2017-04-06 LAB — CBC
HCT: 29.9 % — ABNORMAL LOW (ref 39.0–52.0)
HEMATOCRIT: 30.1 % — AB (ref 39.0–52.0)
Hemoglobin: 9.9 g/dL — ABNORMAL LOW (ref 13.0–17.0)
Hemoglobin: 10 g/dL — ABNORMAL LOW (ref 13.0–17.0)
MCH: 31 pg (ref 26.0–34.0)
MCH: 31.9 pg (ref 26.0–34.0)
MCHC: 32.9 g/dL (ref 30.0–36.0)
MCHC: 33.4 g/dL (ref 30.0–36.0)
MCV: 94.4 fL (ref 78.0–100.0)
MCV: 95.5 fL (ref 78.0–100.0)
PLATELETS: 329 10*3/uL (ref 150–400)
Platelets: 343 10*3/uL (ref 150–400)
RBC: 3.19 MIL/uL — ABNORMAL LOW (ref 4.22–5.81)
RBC: 3.13 MIL/uL — ABNORMAL LOW (ref 4.22–5.81)
RDW: 15.5 % (ref 11.5–15.5)
RDW: 16 % — ABNORMAL HIGH (ref 11.5–15.5)
WBC: 13 10*3/uL — AB (ref 4.0–10.5)
WBC: 13.5 K/uL — ABNORMAL HIGH (ref 4.0–10.5)

## 2017-04-06 LAB — RENAL FUNCTION PANEL
Albumin: 3.5 g/dL (ref 3.5–5.0)
Anion gap: 12 (ref 5–15)
BUN: 44 mg/dL — ABNORMAL HIGH (ref 6–20)
CO2: 24 mmol/L (ref 22–32)
Calcium: 8.3 mg/dL — ABNORMAL LOW (ref 8.9–10.3)
Chloride: 93 mmol/L — ABNORMAL LOW (ref 101–111)
Creatinine, Ser: 8.37 mg/dL — ABNORMAL HIGH (ref 0.61–1.24)
GFR calc Af Amer: 7 mL/min — ABNORMAL LOW (ref >60)
GFR calc non Af Amer: 6 mL/min — ABNORMAL LOW (ref >60)
Glucose, Bld: 124 mg/dL — ABNORMAL HIGH (ref 65–99)
Phosphorus: 4.6 mg/dL (ref 2.5–4.6)
Potassium: 4.3 mmol/L (ref 3.5–5.1)
Sodium: 129 mmol/L — ABNORMAL LOW (ref 135–145)

## 2017-04-06 LAB — PROTIME-INR
INR: 1.03
Prothrombin Time: 13.5 seconds (ref 11.4–15.2)

## 2017-04-06 LAB — GLUCOSE, CAPILLARY
GLUCOSE-CAPILLARY: 176 mg/dL — AB (ref 65–99)
Glucose-Capillary: 121 mg/dL — ABNORMAL HIGH (ref 65–99)
Glucose-Capillary: 181 mg/dL — ABNORMAL HIGH (ref 65–99)

## 2017-04-06 LAB — HEPARIN LEVEL (UNFRACTIONATED): HEPARIN UNFRACTIONATED: 0.56 [IU]/mL (ref 0.30–0.70)

## 2017-04-06 MED ORDER — DOXERCALCIFEROL 4 MCG/2ML IV SOLN
INTRAVENOUS | Status: AC
  Filled 2017-04-06: qty 2

## 2017-04-06 MED ORDER — SODIUM CHLORIDE 0.9 % IV SOLN: 100.0000 mL | INTRAVENOUS | Status: DC | PRN

## 2017-04-06 MED ORDER — LIDOCAINE-PRILOCAINE 2.5-2.5 % EX CREA: 1.0000 "application " | TOPICAL_CREAM | CUTANEOUS | Status: DC | PRN

## 2017-04-06 MED ORDER — LIDOCAINE HCL (PF) 1 % IJ SOLN: 5.0000 mL | INTRAMUSCULAR | Status: DC | PRN

## 2017-04-06 MED ORDER — PENTAFLUOROPROP-TETRAFLUOROETH EX AERO: 1.0000 "application " | INHALATION_SPRAY | CUTANEOUS | Status: DC | PRN

## 2017-04-06 MED ORDER — WARFARIN SODIUM 7.5 MG PO TABS
7.5000 mg | ORAL_TABLET | Freq: Once | ORAL | Status: AC
Start: 2017-04-06 — End: 2017-04-06
  Administered 2017-04-06: 7.5 mg via ORAL
  Filled 2017-04-06: qty 1

## 2017-04-06 NOTE — Progress Notes (Signed)
[facility] PROGRESS NOTE  LAYTON NAVES ATF:[id number] DOB: [birth year] DOA: 04/02/2017   PCP: [name], MD     Assessment/Plan: Principal Problem:   Pulmonary embolism ([place]) Active Problems:   Essential hypertension   Non-insulin-dependent diabetes mellitus with renal complications ([place])   End stage renal disease on dialysis ([place])   ESRD on dialysis ([place])   Left atrial mass   56 year old man of Hispanic origin with past medical history significant for hypertension, type 2 diabetes mellitus and end-stage renal disease on hemodialysis who presents with 2 day history of sudden onset of pleuritic type chest pain. CT scan of the chest showed small partially occlusive bilateral lower lobe pulmonary artery emboli with no evidence of right heart strain, Dopplers of the lower extremities was positive for DV, and is admitted for further management of thromboembolism.  Patient recently underwent failed thrombectomy of right brachiocephalic fistula because of large retained thrombus , status post right IJ tunneled hemodialysis catheter through which he has been getting his dialysis.   Assessment and plan #1 [place] Pulmonary embolism ([place]):  Likely secondary to recent surgery 2-D echo 04/03/2017 -EF 27%, grade 1 diastolic dysfunction, basal inferior hypokinesis Mass noted in the right atrium likely not ever agitation but rather dialysis catheter tip per cardiology Coumadin, with heparin gtt bridge. - Dc when INR therapeutic   #2 Essential hypertension:  Continue Cozaar and Norvasc  #3 End-stage renal disease on hemodialysis:  Adjust dialysate content for hyponatremia Continue dialysis per nephrology  #4 Non-insulin-dependent diabetes mellitus with renal complications ([place]):  C6C 7.8% -04/03/2017 Continue SSI  #5 Leukocytosis:  No evidence of acute infection clinically Likely stress margination:  Continue to follow.  #6 Elevated troponins:  Mild.   No evidence of cardiac ischemia clinically; (+) renal dysfunction    DVT prophylaxsis heparin/Coumadin  Code Status: full code   Family Communication: Discussed in detail with the patient, all imaging results, lab results explained to the patient   Disposition Plan:  As per nephrology , pending INR to be therapeutic with 5 days of overlap      Consultants:  Nephrology  Vascular    Procedures:  Fistulogram  Antibiotics: Anti-infectives    None         HPI/Subjective:  Denies any chest pain shortness of breath, no fever or chill  Objective: Vitals:   04/06/17 0930 04/06/17 1000 04/06/17 1030 04/06/17 1706  BP: 119/88 113/79 107/69 106/68  Pulse: 95 94 88 96  Resp:    18  Temp:    98.4 F (36.9 C)  TempSrc:    Oral  SpO2:    98%  Weight:      Height:        Intake/Output Summary (Last 24 hours) at 04/06/17 1707 Last data filed at 04/06/17 1500  Gross per 24 hour  Intake            827.5 ml  Output              850 ml  Net            -22.5 ml    Exam:  Examination:  General exam: Appears calm and comfortable  Respiratory system: Clear to auscultation. Respiratory effort normal. Cardiovascular system: S1 & S2 heard, RRR. No JVD, murmurs, rubs, gallops or clicks. No pedal edema. Gastrointestinal system: Abdomen is nondistended, soft and nontender. No organomegaly or masses felt. Normal bowel sounds heard. Central nervous system: Alert and oriented. No  focal neurological deficits. Extremities: Symmetric 5 x 5 power. Skin: No rashes, lesions or ulcers Psychiatry: Judgement and insight appear normal. Mood & affect appropriate.     Data Reviewed: I have personally reviewed following labs and imaging studies  Micro Results Recent Results (from the past 240 hour(s))  MRSA PCR Screening     Status: None   Collection Time: 04/02/17  9:02 PM  Result Value Ref Range Status   MRSA by PCR NEGATIVE NEGATIVE Final    Comment:        The GeneXpert  MRSA Assay (FDA approved for NASAL specimens only), is one component of a comprehensive MRSA colonization surveillance program. It is not intended to diagnose MRSA infection nor to guide or monitor treatment for MRSA infections.     Radiology Reports Dg Chest 2 View  Result Date: 04/02/2017 CLINICAL DATA:  56 year old male with centralized chest pain. EXAM: CHEST  2 VIEW COMPARISON:  Chest radiograph dated 02/05/2017 FINDINGS: Right IJ dialysis catheter with tip over right atrium. There is top-normal cardiac size. No vascular congestion or edema. Minimal right lung base atelectatic changes noted. No focal consolidation, pleural effusion, or pneumothorax. No acute osseous pathology. IMPRESSION: 1. No acute cardiopulmonary process. Minimal right lung base atelectatic changes. 2. Top-normal cardiac size.  No vascular congestion or edema. 3. Minimal right lung base atelectatic changes. No focal consolidation. Electronically Signed   By: Anner Crete M.D.   On: 04/02/2017 02:23   Ct Angio Chest Pe W And/or Wo Contrast  Result Date: 04/02/2017 CLINICAL DATA:  56 year old male with history of diabetes and CHF presenting with chest pain. EXAM: CT ANGIOGRAPHY CHEST WITH CONTRAST TECHNIQUE: Multidetector CT imaging of the chest was performed using the standard protocol during bolus administration of intravenous contrast. Multiplanar CT image reconstructions and MIPs were obtained to evaluate the vascular anatomy. CONTRAST:  100 cc Isovue 370 COMPARISON:  Chest radiograph dated 04/02/2017 chest CT dated 10/01/2016 FINDINGS: Cardiovascular: There is mild cardiomegaly. No pericardial effusion. There is multi vessel coronary vascular calcification involving the left main, lad, RCA, and left circumflex artery. A right IJ dialysis catheter is seen with tip in the right atrium. The thoracic aorta appears unremarkable for the degree of enhancement. Small partially occlusive bilateral lower lobe segmental  pulmonary emboli noted. This finding is new compared to the CT of 10/01/2016. There is no CT evidence of right heart straining Mediastinum/Nodes: No hilar or mediastinal adenopathy. The esophagus and the thyroid gland are grossly unremarkable. Lungs/Pleura: The lungs are clear. There is no pleural effusion or pneumothorax. The central airways are patent. Upper Abdomen: No acute abnormality. Musculoskeletal: Mild degenerative changes of the spine. No acute osseous pathology. Review of the MIP images confirms the above findings. IMPRESSION: 1. Small partially occlusive bilateral lower lobe pulmonary artery emboli. No CT evidence of right heart straining. 2. Mild cardiomegaly. Advanced multi vessel coronary vascular calcification. Critical Value/emergent results were called by telephone at the time of interpretation on 04/02/2017 at 3:43 am to Dr. Randal Buba, who verbally acknowledged these results. Electronically Signed   By: Anner Crete M.D.   On: 04/02/2017 03:45   Dg Foot Complete Right  Result Date: 03/11/2017 Please see detailed radiograph report in office note.  Ir Dialy Shunt Intro Needle/intracath Initial W/img Right  Result Date: 04/04/2017 INDICATION: Evaluate for venous stenosis in the right upper extremity. Patient has a right upper arm fistula. EXAM: RIGHT UPPER EXTREMITY FISTULOGRAM MEDICATIONS: None. ANESTHESIA/SEDATION: None FLUOROSCOPY TIME:  Fluoroscopy Time: 32 seconds, 15 mGy  CONTRAST:  32 ml [id number] COMPLICATIONS: None immediate. PROCEDURE: The right upper extremity fistula was accessed with an Angiocath. Series of fistulogram images were obtained. Catheter was removed with manual compression. FINDINGS: The fistula was accessed just above the elbow. The draining vein is mild irregularity and mildly aneurysmal near the access site. However, there is no significant stenosis. Central veins are patent. Arterial anastomosis is patent. Right jugular dialysis catheter extending into the right  atrium. IMPRESSION: Right upper arm fistula is patent without significant stenosis. 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 ACCESS: This access remains amenable to future percutaneous interventions as clinically indicated. Electronically Signed   By: Markus Daft M.D.   On: 04/04/2017 16:26     CBC  Recent Labs Lab 04/02/17 0200  04/02/17 0624  04/04/17 0400 04/04/17 0715 04/05/17 0513 04/06/17 0529 04/06/17 0832  WBC 12.3*  --  14.4*  < > 10.7* 11.4* 13.0* 13.0* 13.5*  HGB 10.2*  < > 9.8*  < > 9.7* 9.4* 10.2* 9.9* 10.0*  HCT 29.8*  < > 28.7*  < > 28.6* 27.8* 30.7* 30.1* 29.9*  PLT 309  --  285  < > 276 293 308 329 343  MCV 93.7  --  93.8  < > 94.4 93.3 94.8 94.4 95.5  MCH 32.1  --  32.0  < > 32.0 31.5 31.5 31.0 31.9  MCHC 34.2  --  34.1  < > 33.9 33.8 33.2 32.9 33.4  RDW 15.4  --  15.6*  < > 15.8* 15.5 15.7* 15.5 16.0*  LYMPHSABS 2.9  --  3.3  --   --   --   --   --   --   MONOABS 0.9  --  1.1*  --   --   --   --   --   --   EOSABS 0.5  --  0.6  --   --   --   --   --   --   BASOSABS 0.1  --  0.1  --   --   --   --   --   --   < > = values in this interval not displayed.  Chemistries   Recent Labs Lab 04/02/17 0226 04/02/17 0624 04/04/17 0400 04/04/17 0715 04/06/17 0831  NA 135 133* 135 134* 129*  K 4.0 4.2 3.7 3.9 4.3  CL 101 100* 100* 99* 93*  CO2  --  20* 24 25 24   GLUCOSE 216* 159* 121* 103* 124*  BUN 57* 60* 40* 39* 44*  CREATININE 9.50* 8.78* 7.19* 7.31* 8.37*  CALCIUM  --  9.3 7.9* 7.9* 8.3*  AST  --  19 17  --   --   ALT  --  17 12*  --   --   ALKPHOS  --  89 79  --   --   BILITOT  --  0.7 0.6  --   --    ------------------------------------------------------------------------------------------------------------------ estimated creatinine clearance is 11.6 mL/min (A) (by C-G formula based on SCr of 8.37 mg/dL  (H)). ------------------------------------------------------------------------------------------------------------------ No results for input(s): HGBA1C in the last 72 hours. ------------------------------------------------------------------------------------------------------------------ No results for input(s): CHOL, HDL, LDLCALC, TRIG, CHOLHDL, LDLDIRECT in the last 72 hours. ------------------------------------------------------------------------------------------------------------------ No results for input(s): TSH, T4TOTAL, T3FREE, THYROIDAB in the last 72 hours.  Invalid input(s): FREET3 ------------------------------------------------------------------------------------------------------------------ No results for input(s): VITAMINB12, FOLATE, FERRITIN, TIBC, IRON, RETICCTPCT in the last 72 hours.  Coagulation profile  Recent Labs Lab 04/02/17 1429 04/03/17  0012 04/04/17 0400 04/05/17 0513 04/06/17 0529  INR 1.06 1.07 1.03 1.00 1.03    No results for input(s): DDIMER in the last 72 hours.  Cardiac Enzymes  Recent Labs Lab 04/02/17 0624 04/02/17 1429 04/02/17 2025  TROPONINI 0.19* 0.13* 0.13*   ------------------------------------------------------------------------------------------------------------------ Invalid input(s): POCBNP   CBG:  Recent Labs Lab 04/05/17 0747 04/05/17 1211 04/05/17 1708 04/05/17 2110 04/06/17 1207  GLUCAP 136* 175* 177* 158* 121*       Studies: No results found.    Lab Results  Component Value Date   HGBA1C 7.8 (H) 04/03/2017   HGBA1C 7.1 (H) 09/29/2016   HGBA1C 7.5 07/20/2016   Lab Results  Component Value Date   MICROALBUR 209.2 (H) 11/05/2014   LDLCALC 72 09/30/2016   CREATININE 8.37 (H) 04/06/2017       Scheduled Meds: . amLODipine  5 mg Oral QHS  . cinacalcet  60 mg Oral Q breakfast  . darbepoetin (ARANESP) injection - DIALYSIS  40 mcg Intravenous Q Thu-HD  . doxercalciferol  2 mcg Intravenous Q  T,Th,Sa-HD  . feeding supplement (NEPRO CARB STEADY)  237 mL Oral BID BM  . insulin aspart  0-9 Units Subcutaneous TID WC  . lanthanum  1,000 mg Oral TID WC  . losartan  25 mg Oral QHS  . multivitamin  1 tablet Oral QHS  . warfarin  7.5 mg Oral ONCE-1800  . warfarin   Does not apply Once  . Warfarin - Pharmacist Dosing Inpatient   Does not apply q1800   Continuous Infusions: . sodium chloride    . sodium chloride    . sodium chloride    . sodium chloride    . heparin 1,250 Units/hr (04/05/17 1908)     LOS: 3 days    Time spent: >30 MINS    OSEI-BONSU,Mardella Nuckles  [facility] Pager [phone]. If 7PM-7AM, please contact night-coverage at [email or web address], password [facility] 04/06/2017, 5:07 PM  LOS: 3 days

## 2017-04-06 NOTE — Progress Notes (Signed)
ANTICOAGULATION CONSULT NOTE - Follow Up Consult  Pharmacy Consult for Heparin + Warfarin  Indication: B/L pulmonary emboli + RLE DVT  Allergies  Allergen Reactions  . No Known Allergies     Patient Measurements: Height: 5\' 11"  (180.3 cm) Weight: 205 lb 14.6 oz (93.4 kg) IBW/kg (Calculated) : 75.3  Vital Signs: Temp: 98.7 F (37.1 C) (08/04 0754) Temp Source: Oral (08/04 0754) BP: 107/69 (08/04 1030) Pulse Rate: 88 (08/04 1030)  Labs:  Recent Labs  04/04/17 0400 04/04/17 0715 04/05/17 0513 04/06/17 0529 04/06/17 0831 04/06/17 0832  HGB 9.7* 9.4* 10.2* 9.9*  --  10.0*  HCT 28.6* 27.8* 30.7* 30.1*  --  29.9*  PLT 276 293 308 329  --  343  LABPROT 13.5  --  13.2 13.5  --   --   INR 1.03  --  1.00 1.03  --   --   HEPARINUNFRC 0.49  --  0.50 0.56  --   --   CREATININE 7.19* 7.31*  --   --  8.37*  --     Estimated Creatinine Clearance: 11.6 mL/min (A) (by C-G formula based on SCr of 8.37 mg/dL (H)).  Medications: Heparin @ 1250 units/hr (12.5 ml/hr)  Assessment: [name] on heparin for new bilateral PE and RLE DVT. Warfarin initiation was delayed due to fistulogram by [facility] on 8/2 which showed a patent fistula. Warfarin restarted 8/3.   Heparin level this morning is therapeutic (HL 0.56, goal of 0.3-0.7). INR today remains SUBtherapeutic (INR 1.03 << 1, goal of 2-3). CBC stable - no bleeding noted at this time. Today will be VTE overlap D#2/5 of the 5 day overlap as recommended by the CHEST guidelines.   The patient was educated on warfarin this admission.  Goal of Therapy:  Heparin level 0.3-0.7 INR 2-3 Monitor platelets by anticoagulation protocol: Yes   Plan:  1. Continue Heparin at 1250 units/hr (12.5 ml/hr) 2. Repeat Warfarin 7.5 mg x 1 dose at 1800 today 3. Will continue to monitor for any signs/symptoms of bleeding and will follow up with heparin level and PT/INR in the a.m.   Thank you for allowing pharmacy to be a part of this patient's care.  Alycia Rossetti, PharmD, BCPS Clinical Pharmacist Pager: [phone] Clinical phone for 04/06/2017 from 7a-3:30p: [phone] If after 3:30p, please call main pharmacy at: [phone] 04/06/2017 2:22 PM

## 2017-04-06 NOTE — Progress Notes (Addendum)
[facility] Progress Note   Subjective:  No C/Os. On HD per schedule. Using AVF. Denies chest pain/SOB.  Objective Vitals:   04/06/17 0514 04/06/17 0754 04/06/17 0800 04/06/17 0830  BP: (!) 139/91 129/78 123/84 128/84  Pulse: 90 90 82 82  Resp: 17 18    Temp: 98.4 F (36.9 C) 98.7 F (37.1 C)    TempSrc: Oral Oral    SpO2: 99% 99%    Weight:  93.4 kg (205 lb 14.6 oz)    Height:       Physical Exam General: WNWD NAD Heart: S1,S2, RRR Lungs: CTAB Abdomen: active BS Extremities: No LE edema Dialysis Access: LUA AVF cannulated at present. RIJ TDC drsg CDI   Additional Objective Labs: Basic Metabolic Panel:  Recent Labs Lab 04/02/17 0624 04/04/17 0400 04/04/17 0715  NA 133* 135 134*  K 4.2 3.7 3.9  CL 100* 100* 99*  CO2 20* 24 25  GLUCOSE 159* 121* 103*  BUN 60* 40* 39*  CREATININE 8.78* 7.19* 7.31*  CALCIUM 9.3 7.9* 7.9*  PHOS  --   --  5.6*   Liver Function Tests:  Recent Labs Lab 04/02/17 0624 04/04/17 0400 04/04/17 0715  AST 19 17  --   ALT 17 12*  --   ALKPHOS 89 79  --   BILITOT 0.7 0.6  --   PROT 7.3 6.9  --   ALBUMIN 3.5 3.3* 3.3*   No results for input(s): LIPASE, AMYLASE in the last 168 hours. CBC:  Recent Labs Lab 04/02/17 0200  04/02/17 0624 04/03/17 0012 04/04/17 0400 04/04/17 0715 04/05/17 0513 04/06/17 0529  WBC 12.3*  --  14.4* 12.2* 10.7* 11.4* 13.0* 13.0*  NEUTROABS 8.0*  --  9.4*  --   --   --   --   --   HGB 10.2*  < > 9.8* 9.7* 9.7* 9.4* 10.2* 9.9*  HCT 29.8*  < > 28.7* 28.2* 28.6* 27.8* 30.7* 30.1*  MCV 93.7  --  93.8 92.2 94.4 93.3 94.8 94.4  PLT 309  --  285 295 276 293 308 329  < > = values in this interval not displayed. Blood Culture    Component Value Date/Time   SDES BLOOD LEFT ANTECUBITAL 09/29/2016 0950   SPECREQUEST BOTTLES DRAWN AEROBIC AND ANAEROBIC  5CC 09/29/2016 0950   CULT NO GROWTH 5 DAYS 09/29/2016 0950   REPTSTATUS 10/04/2016 FINAL 09/29/2016 0950    Cardiac Enzymes:  Recent  Labs Lab 04/02/17 0624 04/02/17 1429 04/02/17 2025  TROPONINI 0.19* 0.13* 0.13*   CBG:  Recent Labs Lab 04/04/17 2128 04/05/17 0747 04/05/17 1211 04/05/17 1708 04/05/17 2110  GLUCAP 129* 136* 175* 177* 158*   Iron Studies: No results for input(s): IRON, TIBC, TRANSFERRIN, FERRITIN in the last 72 hours. @lablastinr3 @ Studies/Results: Ir Dialy Shunt Intro Needle/intracath Initial W/img Right  Result Date: 04/04/2017 INDICATION: Evaluate for venous stenosis in the right upper extremity. Patient has a right upper arm fistula. EXAM: RIGHT UPPER EXTREMITY FISTULOGRAM MEDICATIONS: None. ANESTHESIA/SEDATION: None FLUOROSCOPY TIME:  Fluoroscopy Time: 32 seconds, 15 mGy CONTRAST:  32 ml [id number] COMPLICATIONS: None immediate. PROCEDURE: The right upper extremity fistula was accessed with an Angiocath. Series of fistulogram images were obtained. Catheter was removed with manual compression. FINDINGS: The fistula was accessed just above the elbow. The draining vein is mild irregularity and mildly aneurysmal near the access site. However, there is no significant stenosis. Central veins are patent. Arterial anastomosis is patent. Right jugular dialysis catheter extending into the right  atrium. IMPRESSION: Right upper arm fistula is patent without significant stenosis. 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 ACCESS: This access remains amenable to future percutaneous interventions as clinically indicated. Electronically Signed   By: Markus Daft M.D.   On: 04/04/2017 16:26   Medications: . sodium chloride    . sodium chloride    . sodium chloride    . sodium chloride    . heparin 1,250 Units/hr (04/05/17 1908)   . amLODipine  5 mg Oral QHS  . cinacalcet  60 mg Oral Q breakfast  . darbepoetin (ARANESP) injection - DIALYSIS  40 mcg Intravenous Q Thu-HD  . doxercalciferol  2 mcg Intravenous Q T,Th,Sa-HD  . feeding supplement (NEPRO CARB STEADY)  237 mL Oral BID BM  .  insulin aspart  0-9 Units Subcutaneous TID WC  . lanthanum  1,000 mg Oral TID WC  . losartan  25 mg Oral QHS  . multivitamin  1 tablet Oral QHS  . warfarin   Does not apply Once  . Warfarin - Pharmacist Dosing Inpatient   Does not apply q1800     HD orders: [place] T,Th, S 4 hrs 180 NRe 90 kg  400/800 2.0 K/ 2.5 Ca  Heparin 6000 units IV TIW  Mircera 50 mcg IV Q 2 week (last dose 03/28/17) Venofer 50 mg IV weekly (last dose 03/28/17)  Assessment/Plan: 1.  Bilateral Pulmonary Emboli: Also has RLE femoral vein DVT. Also has thrombus on dialysis catheter tip in R atrium. Cardiology consulted per primary. Will need long term anticoagulation. No TEE planned. Anticoagulation per pharmacy. Continues on heparin gtt. Started back on coumadin 04/05/17.  2. ESRD -Continue T,Th, S. On HD per schedule. 3.0 K bath. Using AVF today. Labs pending.  We are starting to use pt's AV fistula.  If this works x 2-3 HD sessions, then we can get the catheter out, which is desirable in cases of R atrium cath-related thrombus.  3. Anemia - HGB 9.9. Rec'd Aranesp 40 mcg IV 04/04/17. Follow HGB, cont weekly ESA.  4. Secondary hyperparathyroidism - Ca 7.9 C Ca 8.5 Phos 5.6. Continue binders, sensipar-watch Ca while on sensipar. VDRA has been on hold. Will resume. Hectoral 2 mcg IV TIW.  5. HTN/volume - UFG 3.5 needs to watch fluid restrictions. Raising EDW 0.5 kg.  BP well controlled. Cont Amlodipine and losartan.  6. Nutrition - Albumin 3.3 Renal/Carb mod diet. Add renal vit and nepro.  7. AV Access: RUA AVF patent with mild irregularities per shuntogram in IR 04/04/17. BFR 375 with 15g needles. Arterial pressures high at BFR 400.  8. DM: per primary   Rita H. Brown NP-C 04/06/2017, 8:38 AM  [facility] [phone]  Pt seen, examined, agree w assess/plan as above with additions as indicated.  Kelly Splinter MD [facility] pager [phone]    cell [phone] 04/06/2017, 11:10  AM

## 2017-04-07 DIAGNOSIS — N185 Chronic kidney disease, stage 5: Secondary | ICD-10-CM

## 2017-04-07 LAB — PROTIME-INR
INR: 1.04
PROTHROMBIN TIME: 13.6 s (ref 11.4–15.2)

## 2017-04-07 LAB — GLUCOSE, CAPILLARY
Glucose-Capillary: 143 mg/dL — ABNORMAL HIGH (ref 65–99)
Glucose-Capillary: 136 mg/dL — ABNORMAL HIGH (ref 65–99)
Glucose-Capillary: 192 mg/dL — ABNORMAL HIGH (ref 65–99)
Glucose-Capillary: 137 mg/dL — ABNORMAL HIGH (ref 65–99)

## 2017-04-07 LAB — CBC
HCT: 31.3 % — ABNORMAL LOW (ref 39.0–52.0)
Hemoglobin: 10.2 g/dL — ABNORMAL LOW (ref 13.0–17.0)
MCH: 31.2 pg (ref 26.0–34.0)
MCHC: 32.6 g/dL (ref 30.0–36.0)
MCV: 95.7 fL (ref 78.0–100.0)
PLATELETS: 301 10*3/uL (ref 150–400)
RBC: 3.27 MIL/uL — ABNORMAL LOW (ref 4.22–5.81)
RDW: 15.8 % — AB (ref 11.5–15.5)
WBC: 12.5 10*3/uL — ABNORMAL HIGH (ref 4.0–10.5)

## 2017-04-07 LAB — HEPARIN LEVEL (UNFRACTIONATED): HEPARIN UNFRACTIONATED: 0.62 [IU]/mL (ref 0.30–0.70)

## 2017-04-07 MED ORDER — HEPARIN (PORCINE) IN NACL 100-0.45 UNIT/ML-% IJ SOLN
1250.0000 [IU]/h | INTRAMUSCULAR | Status: DC
  Administered 2017-04-07: 1250 [IU]/h via INTRAVENOUS
  Filled 2017-04-07: qty 250

## 2017-04-07 MED ORDER — WARFARIN SODIUM 10 MG PO TABS
10.0000 mg | ORAL_TABLET | Freq: Once | ORAL | Status: AC
  Administered 2017-04-07: 10 mg via ORAL
  Filled 2017-04-07: qty 1

## 2017-04-07 NOTE — Progress Notes (Signed)
VASCULAR SURGERY:  This patient had a tunneled dialysis catheter placed by Dr. Posey Pronto 2 weeks ago. I have been asked to remove the catheter because there is clot at the tip.  At the bedside, under sterile conditions, the sutures were cut and using gentle traction the catheter was easily removed. Pressure was held for hemostasis. A sterile dressing was applied.  His heparin can be resumed at 6 PM.  Deitra Mayo, MD, [place] [phone] Office: [phone]

## 2017-04-07 NOTE — Progress Notes (Signed)
[facility] PROGRESS NOTE  [name] AJG:[id number] DOB: Aug 13, 1961 DOA: 04/02/2017   PCP: Ronald Nearing, MD     Assessment/Plan: Principal Problem:   Pulmonary embolism ([place]) Active Problems:   Essential hypertension   Non-insulin-dependent diabetes mellitus with renal complications ([name])   End stage renal disease on dialysis ([name])   ESRD on dialysis ([name])   Left atrial mass   56 year old man of Hispanic origin with past medical history significant for hypertension, type 2 diabetes mellitus and end-stage renal disease on hemodialysis who presents with 2 day history of sudden onset of pleuritic type chest pain. CT scan of the chest showed small partially occlusive bilateral lower lobe pulmonary artery emboli with no evidence of right heart strain, Dopplers of the lower extremities was positive for DV, and is admitted for further management of thromboembolism.  Patient recently underwent failed thrombectomy of right brachiocephalic fistula because of large retained thrombus , status post right IJ tunneled hemodialysis catheter through which he has been getting his dialysis.   Assessment and plan #1 [place] t[place] Pulmonary embolism ([name]):  Likely secondary to recent surgery 2-D echo 04/03/2017 -EF 35%, grade 1 diastolic dysfunction, basal inferior hypokinesis Mass noted in the right atrium likely not ever agitation but rather dialysis catheter tip per cardiology Coumadin, with heparin gtt bridge. - Dc when INR therapeutic   #2 Essential hypertension:  Continue Cozaar and Norvasc  #3 End-stage renal disease on hemodialysis:  Adjust dialysate content for hyponatremia Continue dialysis per nephrology  #4 Non-insulin-dependent diabetes mellitus with renal complications ([name]):  D9R 7.8% -04/03/2017 Continue SSI  #5 Leukocytosis:  No evidence of acute infection clinically Likely stress margination:  Continue to follow.  #6 Elevated troponins:  Mild.   No evidence of cardiac ischemia clinically; (+) renal dysfunction    DVT prophylaxsis heparin/Coumadin  Code Status: full code   Family Communication: Discussed in detail with the patient, all imaging results, lab results explained to the patient   Disposition Plan:  As per nephrology , pending INR to be therapeutic with 5 days of overlap      Consultants:  Nephrology  Vascular    Procedures:  Fistulogram  Antibiotics: Anti-infectives    None         HPI/Subjective:  Denies any chest pain, no fever or chill  Objective: Vitals:   04/06/17 1706 04/06/17 2207 04/07/17 0554 04/07/17 0921  BP: 106/68 125/77 113/72 (!) 141/82  Pulse: 96 95 96 98  Resp: 18 17 16 18   Temp: 98.4 F (36.9 C) 98.1 F (36.7 C) 98 F (36.7 C) 98 F (36.7 C)  TempSrc: Oral Oral Oral Oral  SpO2: 98% 98% 96% 98%  Weight:  92.4 kg (203 lb 9.6 oz)    Height:        Intake/Output Summary (Last 24 hours) at 04/07/17 1237 Last data filed at 04/07/17 0900  Gross per 24 hour  Intake              750 ml  Output              650 ml  Net              100 ml    Exam:  Examination:  General exam: Appears calm and comfortable  Respiratory system: Clear to auscultation. Respiratory effort normal. Cardiovascular system: S1 & S2 heard, RRR. No JVD, murmurs, rubs, gallops or clicks. No pedal edema. Gastrointestinal system: Abdomen is nondistended, soft and nontender.  No organomegaly or masses felt. Normal bowel sounds heard. Central nervous system: Alert and oriented. No focal neurological deficits. Extremities: Symmetric 5 x 5 power. Skin: No rashes, lesions or ulcers Psychiatry: Judgement and insight appear normal. Mood & affect appropriate.     Data Reviewed: I have personally reviewed following labs and imaging studies  Micro Results Recent Results (from the past 240 hour(s))  MRSA PCR Screening     Status: None   Collection Time: 04/02/17  9:02 PM  Result Value Ref Range  Status   MRSA by PCR NEGATIVE NEGATIVE Final    Comment:        The GeneXpert MRSA Assay (FDA approved for NASAL specimens only), is one component of a comprehensive MRSA colonization surveillance program. It is not intended to diagnose MRSA infection nor to guide or monitor treatment for MRSA infections.     Radiology Reports Dg Chest 2 View  Result Date: 04/02/2017 CLINICAL DATA:  56 year old male with centralized chest pain. EXAM: CHEST  2 VIEW COMPARISON:  Chest radiograph dated 02/05/2017 FINDINGS: Right IJ dialysis catheter with tip over right atrium. There is top-normal cardiac size. No vascular congestion or edema. Minimal right lung base atelectatic changes noted. No focal consolidation, pleural effusion, or pneumothorax. No acute osseous pathology. IMPRESSION: 1. No acute cardiopulmonary process. Minimal right lung base atelectatic changes. 2. Top-normal cardiac size.  No vascular congestion or edema. 3. Minimal right lung base atelectatic changes. No focal consolidation. Electronically Signed   By: Anner Crete M.D.   On: 04/02/2017 02:23   Ct Angio Chest Pe W And/or Wo Contrast  Result Date: 04/02/2017 CLINICAL DATA:  56 year old male with history of diabetes and CHF presenting with chest pain. EXAM: CT ANGIOGRAPHY CHEST WITH CONTRAST TECHNIQUE: Multidetector CT imaging of the chest was performed using the standard protocol during bolus administration of intravenous contrast. Multiplanar CT image reconstructions and MIPs were obtained to evaluate the vascular anatomy. CONTRAST:  100 cc Isovue 370 COMPARISON:  Chest radiograph dated 04/02/2017 chest CT dated 10/01/2016 FINDINGS: Cardiovascular: There is mild cardiomegaly. No pericardial effusion. There is multi vessel coronary vascular calcification involving the left main, lad, RCA, and left circumflex artery. A right IJ dialysis catheter is seen with tip in the right atrium. The thoracic aorta appears unremarkable for the  degree of enhancement. Small partially occlusive bilateral lower lobe segmental pulmonary emboli noted. This finding is new compared to the CT of 10/01/2016. There is no CT evidence of right heart straining Mediastinum/Nodes: No hilar or mediastinal adenopathy. The esophagus and the thyroid gland are grossly unremarkable. Lungs/Pleura: The lungs are clear. There is no pleural effusion or pneumothorax. The central airways are patent. Upper Abdomen: No acute abnormality. Musculoskeletal: Mild degenerative changes of the spine. No acute osseous pathology. Review of the MIP images confirms the above findings. IMPRESSION: 1. Small partially occlusive bilateral lower lobe pulmonary artery emboli. No CT evidence of right heart straining. 2. Mild cardiomegaly. Advanced multi vessel coronary vascular calcification. Critical Value/emergent results were called by telephone at the time of interpretation on 04/02/2017 at 3:43 am to Dr. Randal Buba, who verbally acknowledged these results. Electronically Signed   By: Anner Crete M.D.   On: 04/02/2017 03:45   Dg Foot Complete Right  Result Date: 03/11/2017 Please see detailed radiograph report in office note.  Ir Dialy Shunt Intro Needle/intracath Initial W/img Right  Result Date: 04/04/2017 INDICATION: Evaluate for venous stenosis in the right upper extremity. Patient has a right upper arm fistula. EXAM: RIGHT  UPPER EXTREMITY FISTULOGRAM MEDICATIONS: None. ANESTHESIA/SEDATION: None FLUOROSCOPY TIME:  Fluoroscopy Time: 32 seconds, 15 mGy CONTRAST:  32 ml [id number] COMPLICATIONS: None immediate. PROCEDURE: The right upper extremity fistula was accessed with an Angiocath. Series of fistulogram images were obtained. Catheter was removed with manual compression. FINDINGS: The fistula was accessed just above the elbow. The draining vein is mild irregularity and mildly aneurysmal near the access site. However, there is no significant stenosis. Central veins are patent. Arterial  anastomosis is patent. Right jugular dialysis catheter extending into the right atrium. IMPRESSION: Right upper arm fistula is patent without significant stenosis. 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 ACCESS: This access remains amenable to future percutaneous interventions as clinically indicated. Electronically Signed   By: Markus Daft M.D.   On: 04/04/2017 16:26     CBC  Recent Labs Lab 04/02/17 0200  04/02/17 0624  04/04/17 0715 04/05/17 0513 04/06/17 0529 04/06/17 0832 04/07/17 0528  WBC 12.3*  --  14.4*  < > 11.4* 13.0* 13.0* 13.5* 12.5*  HGB 10.2*  < > 9.8*  < > 9.4* 10.2* 9.9* 10.0* 10.2*  HCT 29.8*  < > 28.7*  < > 27.8* 30.7* 30.1* 29.9* 31.3*  PLT 309  --  285  < > 293 308 329 343 301  MCV 93.7  --  93.8  < > 93.3 94.8 94.4 95.5 95.7  MCH 32.1  --  32.0  < > 31.5 31.5 31.0 31.9 31.2  MCHC 34.2  --  34.1  < > 33.8 33.2 32.9 33.4 32.6  RDW 15.4  --  15.6*  < > 15.5 15.7* 15.5 16.0* 15.8*  LYMPHSABS 2.9  --  3.3  --   --   --   --   --   --   MONOABS 0.9  --  1.1*  --   --   --   --   --   --   EOSABS 0.5  --  0.6  --   --   --   --   --   --   BASOSABS 0.1  --  0.1  --   --   --   --   --   --   < > = values in this interval not displayed.  Chemistries   Recent Labs Lab 04/02/17 0226 04/02/17 0624 04/04/17 0400 04/04/17 0715 04/06/17 0831  NA 135 133* 135 134* 129*  K 4.0 4.2 3.7 3.9 4.3  CL 101 100* 100* 99* 93*  CO2  --  20* 24 25 24   GLUCOSE 216* 159* 121* 103* 124*  BUN 57* 60* 40* 39* 44*  CREATININE 9.50* 8.78* 7.19* 7.31* 8.37*  CALCIUM  --  9.3 7.9* 7.9* 8.3*  AST  --  19 17  --   --   ALT  --  17 12*  --   --   ALKPHOS  --  89 79  --   --   BILITOT  --  0.7 0.6  --   --    ------------------------------------------------------------------------------------------------------------------ estimated creatinine clearance is 11.6 mL/min (A) (by C-G formula based on SCr of 8.37 mg/dL  (H)). ------------------------------------------------------------------------------------------------------------------ No results for input(s): HGBA1C in the last 72 hours. ------------------------------------------------------------------------------------------------------------------ No results for input(s): CHOL, HDL, LDLCALC, TRIG, CHOLHDL, LDLDIRECT in the last 72 hours. ------------------------------------------------------------------------------------------------------------------ No results for input(s): TSH, T4TOTAL, T3FREE, THYROIDAB in the last 72 hours.  Invalid input(s): FREET3 ------------------------------------------------------------------------------------------------------------------ No results for input(s): VITAMINB12, FOLATE, FERRITIN, TIBC, IRON,  RETICCTPCT in the last 72 hours.  Coagulation profile  Recent Labs Lab 04/03/17 0012 04/04/17 0400 04/05/17 0513 04/06/17 0529 04/07/17 0528  INR 1.07 1.03 1.00 1.03 1.04    No results for input(s): DDIMER in the last 72 hours.  Cardiac Enzymes  Recent Labs Lab 04/02/17 0624 04/02/17 1429 04/02/17 2025  TROPONINI 0.19* 0.13* 0.13*   ------------------------------------------------------------------------------------------------------------------ Invalid input(s): POCBNP   CBG:  Recent Labs Lab 04/06/17 1207 04/06/17 1736 04/06/17 2201 04/07/17 0714 04/07/17 1134  GLUCAP 121* 176* 181* 143* 136*       Studies: No results found.    Lab Results  Component Value Date   HGBA1C 7.8 (H) 04/03/2017   HGBA1C 7.1 (H) 09/29/2016   HGBA1C 7.5 07/20/2016   Lab Results  Component Value Date   MICROALBUR 209.2 (H) 11/05/2014   LDLCALC 72 09/30/2016   CREATININE 8.37 (H) 04/06/2017       Scheduled Meds: . amLODipine  5 mg Oral QHS  . cinacalcet  60 mg Oral Q breakfast  . darbepoetin (ARANESP) injection - DIALYSIS  40 mcg Intravenous Q Thu-HD  . doxercalciferol  2 mcg Intravenous Q  T,Th,Sa-HD  . feeding supplement (NEPRO CARB STEADY)  237 mL Oral BID BM  . insulin aspart  0-9 Units Subcutaneous TID WC  . lanthanum  1,000 mg Oral TID WC  . losartan  25 mg Oral QHS  . multivitamin  1 tablet Oral QHS  . warfarin   Does not apply Once  . Warfarin - Pharmacist Dosing Inpatient   Does not apply q1800   Continuous Infusions: . sodium chloride    . sodium chloride    . sodium chloride    . sodium chloride       LOS: 4 days    Time spent: [place]  [facility] Pager [phone]. If 7PM-7AM, please contact night-coverage at [email or web address], password [facility] 04/07/2017, 12:37 PM  LOS: 4 days

## 2017-04-07 NOTE — Progress Notes (Signed)
[facility] Progress Note   Subjective: No C/Os. Ambulating in room. Looks good.   Objective Vitals:   04/06/17 1154 04/06/17 1706 04/06/17 2207 04/07/17 0554  BP: 124/68 106/68 125/77 113/72  Pulse: 90 96 95 96  Resp: 18 18 17 16   Temp: 98 F (36.7 C) 98.4 F (36.9 C) 98.1 F (36.7 C) 98 F (36.7 C)  TempSrc: Oral Oral Oral Oral  SpO2: 99% 98% 98% 96%  Weight: 90.4 kg (199 lb 4.7 oz)  92.4 kg (203 lb 9.6 oz)   Height:       Physical Exam General: Well appearing male NAD Heart: S1,S2, No M/R/G Lungs: CTAB A/P Abdomen: soft, non-distended Extremities: No LE edema Dialysis Access: RIJ TDC Drsg CDI/RUA AVF + bruit   Additional Objective Labs: Basic Metabolic Panel:  Recent Labs Lab 04/04/17 0400 04/04/17 0715 04/06/17 0831  NA 135 134* 129*  K 3.7 3.9 4.3  CL 100* 99* 93*  CO2 24 25 24   GLUCOSE 121* 103* 124*  BUN 40* 39* 44*  CREATININE 7.19* 7.31* 8.37*  CALCIUM 7.9* 7.9* 8.3*  PHOS  --  5.6* 4.6   Liver Function Tests:  Recent Labs Lab 04/02/17 0624 04/04/17 0400 04/04/17 0715 04/06/17 0831  AST 19 17  --   --   ALT 17 12*  --   --   ALKPHOS 89 79  --   --   BILITOT 0.7 0.6  --   --   PROT 7.3 6.9  --   --   ALBUMIN 3.5 3.3* 3.3* 3.5   No results for input(s): LIPASE, AMYLASE in the last 168 hours. CBC:  Recent Labs Lab 04/02/17 0200  04/02/17 0624  04/04/17 0715 04/05/17 0513 04/06/17 0529 04/06/17 0832 04/07/17 0528  WBC 12.3*  --  14.4*  < > 11.4* 13.0* 13.0* 13.5* 12.5*  NEUTROABS 8.0*  --  9.4*  --   --   --   --   --   --   HGB 10.2*  < > 9.8*  < > 9.4* 10.2* 9.9* 10.0* 10.2*  HCT 29.8*  < > 28.7*  < > 27.8* 30.7* 30.1* 29.9* 31.3*  MCV 93.7  --  93.8  < > 93.3 94.8 94.4 95.5 95.7  PLT 309  --  285  < > 293 308 329 343 301  < > = values in this interval not displayed. Blood Culture    Component Value Date/Time   SDES BLOOD LEFT ANTECUBITAL 09/29/2016 0950   SPECREQUEST BOTTLES DRAWN AEROBIC AND ANAEROBIC   5CC 09/29/2016 0950   CULT NO GROWTH 5 DAYS 09/29/2016 0950   REPTSTATUS 10/04/2016 FINAL 09/29/2016 0950    Cardiac Enzymes:  Recent Labs Lab 04/02/17 0624 04/02/17 1429 04/02/17 2025  TROPONINI 0.19* 0.13* 0.13*   CBG:  Recent Labs Lab 04/05/17 2110 04/06/17 1207 04/06/17 1736 04/06/17 2201 04/07/17 0714  GLUCAP 158* 121* 176* 181* 143*   Iron Studies: No results for input(s): IRON, TIBC, TRANSFERRIN, FERRITIN in the last 72 hours. @lablastinr3 @ Studies/Results: No results found. Medications: . sodium chloride    . sodium chloride    . sodium chloride    . sodium chloride    . heparin 1,250 Units/hr (04/05/17 1908)   . amLODipine  5 mg Oral QHS  . cinacalcet  60 mg Oral Q breakfast  . darbepoetin (ARANESP) injection - DIALYSIS  40 mcg Intravenous Q Thu-HD  . doxercalciferol  2 mcg Intravenous Q T,Th,Sa-HD  . feeding supplement (NEPRO  CARB STEADY)  237 mL Oral BID BM  . insulin aspart  0-9 Units Subcutaneous TID WC  . lanthanum  1,000 mg Oral TID WC  . losartan  25 mg Oral QHS  . multivitamin  1 tablet Oral QHS  . warfarin   Does not apply Once  . Warfarin - Pharmacist Dosing Inpatient   Does not apply q1800     HD orders: [place] T,Th, S 4 hrs 180 NRe 90 kg  400/800 2.0 K/ 2.5 Ca  Heparin 6000 units IV TIW  Mircera 50 mcg IV Q 2 week (last dose 03/28/17) Venofer 50 mg IV weekly (last dose 03/28/17)  Assessment/Plan: 1. Bilateral PE/ R femoral vein DVT - also has thrombus on dialysis catheter tip in R atrium. Cardiology consulted per primary. Will need long term anticoagulation. No TEE planned. Anticoagulation per pharmacy. Continues on heparin gtt. Started back on coumadin 04/05/17. INR 1.04 today. Heparin level 0.62.  2. ESRD -Continue T,Th, S. Next HD 04/09/17.  We are starting to use pt's AV fistula.  If this works x 2-3 HD sessions, then we can get the catheter out, which is desirable in cases of R atrium cath-related thrombus.  3. Anemia - HGB 10.2  today. Rec'd Aranesp 40 mcg IV 04/04/17. Follow HGB, cont weekly ESA.  4. Secondary hyperparathyroidism - Ca 8.4  Ca 8.8 Phos 4.6. Continue binders, sensipar-watch Ca while on sensipar. VDRA has been on hold. Will resume Hectoral 2 mcg IV TIW.  5. HTN/volume - HD yesterday Pre wt . 93.4 kg Net UF 3.0 liters Post wt 90.4 kg. Have raised EDW to 90.5 kg.   BP well controlled. Cont Amlodipine and losartan.  6. Nutrition - Albumin 3.5. Renal/Carb mod diet. Add renal vit and nepro.  7. AV Access: RUA AVF patent with mild irregularities per shuntogram in IR 04/04/17. BFR 375 with 15g needles. Continue using AVF and try to get cath out ASAP as noted above.  8. DM: per primary  Rita H. Brown NP-C 04/07/2017, 9:06 AM  [facility] [phone]  Pt seen, examined, agree w assess/plan as above with additions as indicated. AVF was successfully used yesterday and we should get the HD cath out before the INR goes up (sp 2 doses of coumadin so far).  Have asked [facility] to remove TDC today.  Stopping heparin til this is done.   Kelly Splinter MD [employer] pager [phone]    cell [phone] 04/07/2017, 12:23 PM

## 2017-04-07 NOTE — Progress Notes (Signed)
ANTICOAGULATION CONSULT NOTE - Follow Up Consult  Pharmacy Consult for Heparin + Warfarin  Indication: B/L pulmonary emboli + RLE DVT  Allergies  Allergen Reactions  . No Known Allergies     Patient Measurements: Height: 5\' 11"  (180.3 cm) Weight: 203 lb 9.6 oz (92.4 kg) IBW/kg (Calculated) : 75.3  Vital Signs: Temp: 98 F (36.7 C) (08/05 0921) Temp Source: Oral (08/05 0921) BP: 141/82 (08/05 0921) Pulse Rate: 98 (08/05 0921)  Labs:  Recent Labs  04/05/17 0513 04/06/17 0529 04/06/17 0831 04/06/17 0832 04/07/17 0528  HGB 10.2* 9.9*  --  10.0* 10.2*  HCT 30.7* 30.1*  --  29.9* 31.3*  PLT 308 329  --  343 301  LABPROT 13.2 13.5  --   --  13.6  INR 1.00 1.03  --   --  1.04  HEPARINUNFRC 0.50 0.56  --   --  0.62  CREATININE  --   --  8.37*  --   --     Estimated Creatinine Clearance: 11.6 mL/min (A) (by C-G formula based on SCr of 8.37 mg/dL (H)).  Medications: Heparin @ 1250 units/hr (12.5 ml/hr)  Assessment: [name] on heparin for new bilateral PE and RLE DVT. Warfarin initiation was delayed due to fistulogram by VVS on 8/2 which showed a patent fistula. Warfarin restarted 8/3.   Heparin level this morning was therapeutic (HL 0.62 << 0.56, goal of 0.3-0.7). It is noted that the heparin drip was turned off around noon today for [facility] removal. Per [facility] note, okay to resume Heparin at 1800. Will wait and recheck level with AM labs since the patient has been stable on this rate.  INR today remains SUBtherapeutic (INR 1.04 << 1.03, goal of 2-3). CBC stable - no bleeding noted at this time. Today will be VTE overlap D#3/5 of the 5 day overlap as recommended by the CHEST guidelines.   The patient was educated on warfarin this admission.  Goal of Therapy:  Heparin level 0.3-0.7 INR 2-3 Monitor platelets by anticoagulation protocol: Yes   Plan:  1. Resume Heparin at 1250 units/hr (12.5 ml/hr) starting at 1800 2. Repeat Warfarin 10 mg x 1 dose at 1800 today 3. Will  continue to monitor for any signs/symptoms of bleeding and will follow up with heparin level and PT/INR in the a.m.   Thank you for allowing pharmacy to be a part of this patient's care.  Alycia Rossetti, PharmD, BCPS Clinical Pharmacist Pager: [phone] Clinical phone for 04/07/2017 from 7a-3:30p: [phone] If after 3:30p, please call main pharmacy at: [phone] 04/07/2017 1:52 PM

## 2017-04-08 LAB — CBC
HCT: 30.2 % — ABNORMAL LOW (ref 39.0–52.0)
HEMOGLOBIN: 9.8 g/dL — AB (ref 13.0–17.0)
MCH: 31.6 pg (ref 26.0–34.0)
MCHC: 32.5 g/dL (ref 30.0–36.0)
MCV: 97.4 fL (ref 78.0–100.0)
Platelets: 332 10*3/uL (ref 150–400)
RBC: 3.1 MIL/uL — AB (ref 4.22–5.81)
RDW: 16 % — ABNORMAL HIGH (ref 11.5–15.5)
WBC: 13.8 10*3/uL — ABNORMAL HIGH (ref 4.0–10.5)

## 2017-04-08 LAB — HEPARIN LEVEL (UNFRACTIONATED)
HEPARIN UNFRACTIONATED: 0.57 [IU]/mL (ref 0.30–0.70)
Heparin Unfractionated: 0.27 IU/mL — ABNORMAL LOW (ref 0.30–0.70)

## 2017-04-08 LAB — GLUCOSE, CAPILLARY
GLUCOSE-CAPILLARY: 111 mg/dL — AB (ref 65–99)
GLUCOSE-CAPILLARY: 213 mg/dL — AB (ref 65–99)
GLUCOSE-CAPILLARY: 172 mg/dL — AB (ref 65–99)
Glucose-Capillary: 87 mg/dL (ref 65–99)

## 2017-04-08 LAB — PROTIME-INR
INR: 1.47
Prothrombin Time: 18 seconds — ABNORMAL HIGH (ref 11.4–15.2)

## 2017-04-08 MED ORDER — HEPARIN (PORCINE) IN NACL 100-0.45 UNIT/ML-% IJ SOLN
1300.0000 [IU]/h | INTRAMUSCULAR | Status: DC
  Administered 2017-04-08 – 2017-04-09 (×4): 1300 [IU]/h via INTRAVENOUS
  Filled 2017-04-08 (×4): qty 250

## 2017-04-08 MED ORDER — WARFARIN SODIUM 10 MG PO TABS
10.0000 mg | ORAL_TABLET | Freq: Once | ORAL | Status: AC
  Administered 2017-04-08: 10 mg via ORAL
  Filled 2017-04-08: qty 1

## 2017-04-08 NOTE — Care Management Note (Signed)
Case Management Note  Patient Details  Name: [name] MRN: [id number] Date of Birth: [birth year]  Subjective/Objective:         CM following for progression and d/c planning.            Action/Plan: 04/08/2017 Met with pt who is a pt of [facility] [facility] [employer]. He is aware of the need for hospital followup and labs for Coumadin monitoring. Pt wishes to make his own appointment for followup. Importance of followup was discussed with the pt who verbalized understanding.   Expected Discharge Date:    04/09/2017              Expected Discharge Plan:  Home/Self Care  In-House Referral:  Clinical Social Work  Discharge planning Services  CM Consult, [facility]  Post Acute Care Choice:    Choice offered to:  NA  DME Arranged:  N/A DME Agency:  NA  HH Arranged:  (S) NA HH Agency:  [facility]  Status of Service:  Completed, signed off  If discussed at [employer], dates discussed:    Additional Comments:  Adron Bene, RN 04/08/2017, 11:32 AM

## 2017-04-08 NOTE — Progress Notes (Signed)
ANTICOAGULATION CONSULT NOTE - Follow Up Consult  Pharmacy Consult for Heparin + Warfarin  Indication: B/L pulmonary emboli + RLE DVT  Allergies  Allergen Reactions  . No Known Allergies     Patient Measurements: Height: 5\' 11"  (180.3 cm) Weight: 205 lb 7.5 oz (93.2 kg) IBW/kg (Calculated) : 75.3  Vital Signs: Temp: 98.1 F (36.7 C) (08/06 0925) Temp Source: Oral (08/06 0925) BP: 143/89 (08/06 0925) Pulse Rate: 94 (08/06 0925)  Labs:  Recent Labs  04/06/17 0529 04/06/17 0831 04/06/17 2863 04/07/17 0528 04/08/17 0532 04/08/17 1814  HGB 9.9*  --  10.0* 10.2* 9.8*  --   HCT 30.1*  --  29.9* 31.3* 30.2*  --   PLT 329  --  343 301 332  --   LABPROT 13.5  --   --  13.6 18.0*  --   INR 1.03  --   --  1.04 1.47  --   HEPARINUNFRC 0.56  --   --  0.62 0.27* 0.57  CREATININE  --  8.37*  --   --   --   --     Estimated Creatinine Clearance: 11.6 mL/min (A) (by C-G formula based on SCr of 8.37 mg/dL (H)).  Medications: Heparin @ 1250 units/hr (12.5 ml/hr)  Assessment: [name] on heparin for new bilateral PE and RLE DVT, on heparin bridge to coumadin.  Heparin level 0.57, therapeutic now after rate increased to 1300 units/hr earlier today.  Goal of Therapy:  Heparin level 0.3-0.7 INR 2-3 Monitor platelets by anticoagulation protocol: Yes   Plan:  Continue heparin at current rate F/u Am labs.   Maryanna Shape, PharmD, BCPS  Clinical Pharmacist  Pager: [phone]   04/08/2017 7:13 PM

## 2017-04-08 NOTE — Progress Notes (Signed)
[facility] Progress Note   Subjective:  No overnight events. No CP or dyspnea. Awaiting INR to be therapeutic.    Objective Vitals:   04/07/17 2212 04/08/17 0209 04/08/17 0644 04/08/17 0925  BP: 124/66 127/78 133/83 (!) 143/89  Pulse: 90  94 94  Resp: 16  18 18   Temp: 98.5 F (36.9 C)  98.5 F (36.9 C) 98.1 F (36.7 C)  TempSrc: Oral  Oral Oral  SpO2: 98%  99% 100%  Weight: 93.2 kg (205 lb 7.5 oz)     Height:       Physical Exam General: Well appearing male, NAD. Heart: RRR; no murmur. Lungs: CTAB Extremities: No LE edema. Dialysis Access:  RUE AVF + thrill, TDC out: site without erythema/drainage  Additional Objective Labs: Basic Metabolic Panel:  Recent Labs Lab 04/04/17 0400 04/04/17 0715 04/06/17 0831  NA 135 134* 129*  K 3.7 3.9 4.3  CL 100* 99* 93*  CO2 24 25 24   GLUCOSE 121* 103* 124*  BUN 40* 39* 44*  CREATININE 7.19* 7.31* 8.37*  CALCIUM 7.9* 7.9* 8.3*  PHOS  --  5.6* 4.6   Liver Function Tests:  Recent Labs Lab 04/02/17 0624 04/04/17 0400 04/04/17 0715 04/06/17 0831  AST 19 17  --   --   ALT 17 12*  --   --   ALKPHOS 89 79  --   --   BILITOT 0.7 0.6  --   --   PROT 7.3 6.9  --   --   ALBUMIN 3.5 3.3* 3.3* 3.5   CBC:  Recent Labs Lab 04/02/17 0200  04/02/17 0624  04/05/17 0513 04/06/17 0529 04/06/17 0832 04/07/17 0528 04/08/17 0532  WBC 12.3*  --  14.4*  < > 13.0* 13.0* 13.5* 12.5* 13.8*  NEUTROABS 8.0*  --  9.4*  --   --   --   --   --   --   HGB 10.2*  < > 9.8*  < > 10.2* 9.9* 10.0* 10.2* 9.8*  HCT 29.8*  < > 28.7*  < > 30.7* 30.1* 29.9* 31.3* 30.2*  MCV 93.7  --  93.8  < > 94.8 94.4 95.5 95.7 97.4  PLT 309  --  285  < > 308 329 343 301 332  < > = values in this interval not displayed.  CBG:  Recent Labs Lab 04/07/17 0714 04/07/17 1134 04/07/17 1635 04/07/17 2209 04/08/17 0804  GLUCAP 143* 136* 192* 137* 111*   Medications: . heparin     . amLODipine  5 mg Oral QHS  . cinacalcet  60 mg Oral Q  breakfast  . darbepoetin (ARANESP) injection - DIALYSIS  40 mcg Intravenous Q Thu-HD  . doxercalciferol  2 mcg Intravenous Q T,Th,Sa-HD  . feeding supplement (NEPRO CARB STEADY)  237 mL Oral BID BM  . insulin aspart  0-9 Units Subcutaneous TID WC  . lanthanum  1,000 mg Oral TID WC  . losartan  25 mg Oral QHS  . multivitamin  1 tablet Oral QHS  . warfarin   Does not apply Once  . Warfarin - Pharmacist Dosing Inpatient   Does not apply q1800    Dialysis Orders: TTS at [facility] 4 hrs 180 NRe 90 kg, 400/800 2.0 K/ 2.5 Ca, Heparin 6000 units IV TIW  Mircera 50 mcg IV Q 2 week (last dose 03/28/17) Venofer 50 mg IV weekly (last dose 03/28/17)  Assessment/Plan: 1. Bilateral Pulmonary Embolism/ R femoral vein DVT/R TDC tip thrombus: Will need long-term  anticoagulation, started coumadin 04/05/17. INR 1.47, remains on heparin drip. 2. ESRD: Continue TTS schedule, next 8/7. AVF in use again, [facility] has been removed. 3. Anemia: Hgb 9.8. Continue Aranesp 40 mcg weekly. Monitor. 4. Secondary hyperparathyroidism: Labs controlled. Continue binders, sensipar. VDRA has been on hold, now resumed Hectoral 2 mcg IV TIW.  5. HTN/volume: BP fine, EDW raised to 90.5kg. Cont amlodipine and losartan.  6. Nutrition: Albumin 3.5. Continue renal diet and Nepro. 7. AV Access: Has aneurysmal RUE AVF which was found to be thrombosed 2 weeks PTA. Underwent failed thrombectomy (large burden of retained thrombus) at [facility] and R TDC placed at the time. On admit here, noted that the R AVF appeared patent with active thrill/bruit. Fistulogram patent with mild irregularities by IR 04/04/17, able to be used successfully 8/3. TDC removed 8/5. 8. DM: On SSI, per primary.  Veneta Penton, PA-C 04/08/2017, 10:41 AM  [facility] Pager: [phone]  Pt seen, examined and agree w A/P as above.  Kelly Splinter MD [employer] pager [phone]   04/08/2017, 11:06 AM

## 2017-04-08 NOTE — Progress Notes (Signed)
[facility] PROGRESS NOTE   Assessment/Plan: Principal Problem:   Pulmonary embolism (HCC) Active Problems:   Essential hypertension   Non-insulin-dependent diabetes mellitus with renal complications (HCC)   End stage renal disease on dialysis ([place])   ESRD on dialysis ([place])   Left atrial mass   33 Hispanic origin with past medical history significant for hypertension,  type 2 diabetes mellitus and end-stage renal disease on hemodialysis who presents with 2 day history of sudden onset of pleuritic type chest pain. CT scan of the chest showed small partially occlusive bilateral lower lobe pulmonary artery emboli with no evidence of right heart strain, Dopplers of the lower extremities was positive for DVT--admitted for further management of thromboembolism.  Patient recently underwent failed thrombectomy of right brachiocephalic fistula because of large retained thrombus , status post right IJ tunneled hemodialysis catheter through which he has been getting his dialysis.   #1 Bilateral Pulmonary embolism ([place]):  Likely secondary to recent surgery 2-D echo 04/03/2017 -EF 84%, grade 1 diastolic dysfunction, basal inferior hypokinesis Mass noted in the right atrium likely dialysis catheter tip per cardiology Coumadin, with heparin gtt bridge as per Pharm D. - Dc when INR therapeutic   #2 Essential hypertension:  Continue Cozaar 25 od and Norvasc 5 mg od  #3 End-stage renal disease on hemodialysis T /Th/Sat Anemia of renal disease Adjust dialysate content for hyponatremia Continue dialysis per nephrology Sensipar, Fosrenol and Hectoral per nephro Continue Aranesp 40 mcg q thurs  #4 Non-insulin-dependent diabetes mellitus with renal complications ([place]):  Y6Z 7.8% -04/03/2017 Continue SSI Sugars are stable 111-213  #5 Leukocytosis:  No evidence of acute infection clinically Likely stress de-margination Continue to follow.  #6 Elevated troponins:  Mild.  No  evidence of cardiac ischemia clinically; (+) renal dysfunction    DVT prophylaxsis heparin/Coumadin  Code Status: full code   Family Communication: Discussed in detail with the patient, all imaging results, lab results explained to the patient   Disposition Plan:  As per nephrology , pending INR to be therapeutic with 5 days of overlap      Consultants:  Nephrology  Vascular    Procedures:  Fistulogram  Antibiotics: Anti-infectives    None         HPI/Subjective:  Fair, no CP Eating and drinking No SOB No diarrhea  Objective: Vitals:   04/07/17 1711 04/07/17 2212 04/08/17 0209 04/08/17 0644  BP: 126/76 124/66 127/78 133/83  Pulse: 100 90  94  Resp: 18 16  18   Temp: 98.6 F (37 C) 98.5 F (36.9 C)  98.5 F (36.9 C)  TempSrc: Oral Oral  Oral  SpO2: 98% 98%  99%  Weight:  93.2 kg (205 lb 7.5 oz)    Height:        Intake/Output Summary (Last 24 hours) at 04/08/17 0834 Last data filed at 04/08/17 0600  Gross per 24 hour  Intake          1236.67 ml  Output             1075 ml  Net           161.67 ml    Exam:  Examination:  eomi ncat Neck soft cta b, no added sound abd soft nt nd  No le edema Alert awake oriented x 2  Data Reviewed: I have personally reviewed following labs and imaging studies  Micro Results Recent Results (from the past 240 hour(s))  MRSA PCR Screening  Status: None   Collection Time: 04/02/17  9:02 PM  Result Value Ref Range Status   MRSA by PCR NEGATIVE NEGATIVE Final    Comment:        The GeneXpert MRSA Assay (FDA approved for NASAL specimens only), is one component of a comprehensive MRSA colonization surveillance program. It is not intended to diagnose MRSA infection nor to guide or monitor treatment for MRSA infections.     Radiology Reports Dg Chest 2 View  Result Date: 04/02/2017 CLINICAL DATA:  56 year old male with centralized chest pain. EXAM: CHEST  2 VIEW COMPARISON:  Chest radiograph  dated 02/05/2017 FINDINGS: Right IJ dialysis catheter with tip over right atrium. There is top-normal cardiac size. No vascular congestion or edema. Minimal right lung base atelectatic changes noted. No focal consolidation, pleural effusion, or pneumothorax. No acute osseous pathology. IMPRESSION: 1. No acute cardiopulmonary process. Minimal right lung base atelectatic changes. 2. Top-normal cardiac size.  No vascular congestion or edema. 3. Minimal right lung base atelectatic changes. No focal consolidation. Electronically Signed   By: Anner Crete M.D.   On: 04/02/2017 02:23   Ct Angio Chest Pe W And/or Wo Contrast  Result Date: 04/02/2017 CLINICAL DATA:  56 year old male with history of diabetes and CHF presenting with chest pain. EXAM: CT ANGIOGRAPHY CHEST WITH CONTRAST TECHNIQUE: Multidetector CT imaging of the chest was performed using the standard protocol during bolus administration of intravenous contrast. Multiplanar CT image reconstructions and MIPs were obtained to evaluate the vascular anatomy. CONTRAST:  100 cc Isovue 370 COMPARISON:  Chest radiograph dated 04/02/2017 chest CT dated 10/01/2016 FINDINGS: Cardiovascular: There is mild cardiomegaly. No pericardial effusion. There is multi vessel coronary vascular calcification involving the left main, lad, RCA, and left circumflex artery. A right IJ dialysis catheter is seen with tip in the right atrium. The thoracic aorta appears unremarkable for the degree of enhancement. Small partially occlusive bilateral lower lobe segmental pulmonary emboli noted. This finding is new compared to the CT of 10/01/2016. There is no CT evidence of right heart straining Mediastinum/Nodes: No hilar or mediastinal adenopathy. The esophagus and the thyroid gland are grossly unremarkable. Lungs/Pleura: The lungs are clear. There is no pleural effusion or pneumothorax. The central airways are patent. Upper Abdomen: No acute abnormality. Musculoskeletal: Mild  degenerative changes of the spine. No acute osseous pathology. Review of the MIP images confirms the above findings. IMPRESSION: 1. Small partially occlusive bilateral lower lobe pulmonary artery emboli. No CT evidence of right heart straining. 2. Mild cardiomegaly. Advanced multi vessel coronary vascular calcification. Critical Value/emergent results were called by telephone at the time of interpretation on 04/02/2017 at 3:43 am to Dr. Randal Buba, who verbally acknowledged these results. Electronically Signed   By: Anner Crete M.D.   On: 04/02/2017 03:45   Dg Foot Complete Right  Result Date: 03/11/2017 Please see detailed radiograph report in office note.  Ir Dialy Shunt Intro Needle/intracath Initial W/img Right  Result Date: 04/04/2017 INDICATION: Evaluate for venous stenosis in the right upper extremity. Patient has a right upper arm fistula. EXAM: RIGHT UPPER EXTREMITY FISTULOGRAM MEDICATIONS: None. ANESTHESIA/SEDATION: None FLUOROSCOPY TIME:  Fluoroscopy Time: 32 seconds, 15 mGy CONTRAST:  32 ml [id number] COMPLICATIONS: None immediate. PROCEDURE: The right upper extremity fistula was accessed with an Angiocath. Series of fistulogram images were obtained. Catheter was removed with manual compression. FINDINGS: The fistula was accessed just above the elbow. The draining vein is mild irregularity and mildly aneurysmal near the access site. However, there is no significant  stenosis. Central veins are patent. Arterial anastomosis is patent. Right jugular dialysis catheter extending into the right atrium. IMPRESSION: Right upper arm fistula is patent without significant stenosis. 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 ACCESS: This access remains amenable to future percutaneous interventions as clinically indicated. Electronically Signed   By: Markus Daft M.D.   On: 04/04/2017 16:26     CBC  Recent Labs Lab 04/02/17 0200  04/02/17 0624  04/05/17 0513 04/06/17 0529  04/06/17 0832 04/07/17 0528 04/08/17 0532  WBC 12.3*  --  14.4*  < > 13.0* 13.0* 13.5* 12.5* 13.8*  HGB 10.2*  < > 9.8*  < > 10.2* 9.9* 10.0* 10.2* 9.8*  HCT 29.8*  < > 28.7*  < > 30.7* 30.1* 29.9* 31.3* 30.2*  PLT 309  --  285  < > 308 329 343 301 332  MCV 93.7  --  93.8  < > 94.8 94.4 95.5 95.7 97.4  MCH 32.1  --  32.0  < > 31.5 31.0 31.9 31.2 31.6  MCHC 34.2  --  34.1  < > 33.2 32.9 33.4 32.6 32.5  RDW 15.4  --  15.6*  < > 15.7* 15.5 16.0* 15.8* 16.0*  LYMPHSABS 2.9  --  3.3  --   --   --   --   --   --   MONOABS 0.9  --  1.1*  --   --   --   --   --   --   EOSABS 0.5  --  0.6  --   --   --   --   --   --   BASOSABS 0.1  --  0.1  --   --   --   --   --   --   < > = values in this interval not displayed.  Chemistries   Recent Labs Lab 04/02/17 0226 04/02/17 0624 04/04/17 0400 04/04/17 0715 04/06/17 0831  NA 135 133* 135 134* 129*  K 4.0 4.2 3.7 3.9 4.3  CL 101 100* 100* 99* 93*  CO2  --  20* 24 25 24   GLUCOSE 216* 159* 121* 103* 124*  BUN 57* 60* 40* 39* 44*  CREATININE 9.50* 8.78* 7.19* 7.31* 8.37*  CALCIUM  --  9.3 7.9* 7.9* 8.3*  AST  --  19 17  --   --   ALT  --  17 12*  --   --   ALKPHOS  --  89 79  --   --   BILITOT  --  0.7 0.6  --   --    ------------------------------------------------------------------------------------------------------------------ estimated creatinine clearance is 11.6 mL/min (A) (by C-G formula based on SCr of 8.37 mg/dL (H)). ------------------------------------------------------------------------------------------------------------------ No results for input(s): HGBA1C in the last 72 hours. ------------------------------------------------------------------------------------------------------------------ No results for input(s): CHOL, HDL, LDLCALC, TRIG, CHOLHDL, LDLDIRECT in the last 72 hours. ------------------------------------------------------------------------------------------------------------------ No results for input(s): TSH,  T4TOTAL, T3FREE, THYROIDAB in the last 72 hours.  Invalid input(s): FREET3 ------------------------------------------------------------------------------------------------------------------ No results for input(s): VITAMINB12, FOLATE, FERRITIN, TIBC, IRON, RETICCTPCT in the last 72 hours.  Coagulation profile  Recent Labs Lab 04/04/17 0400 04/05/17 0513 04/06/17 0529 04/07/17 0528 04/08/17 0532  INR 1.03 1.00 1.03 1.04 1.47    No results for input(s): DDIMER in the last 72 hours.  Cardiac Enzymes  Recent Labs Lab 04/02/17 0624 04/02/17 1429 04/02/17 2025  TROPONINI 0.19* 0.13* 0.13*   ------------------------------------------------------------------------------------------------------------------ Invalid input(s): POCBNP   CBG:  Recent Labs Lab  04/07/17 0714 04/07/17 1134 04/07/17 1635 04/07/17 2209 04/08/17 0804  GLUCAP 143* 136* 192* 137* 111*       Studies: No results found.    Lab Results  Component Value Date   HGBA1C 7.8 (H) 04/03/2017   HGBA1C 7.1 (H) 09/29/2016   HGBA1C 7.5 07/20/2016   Lab Results  Component Value Date   MICROALBUR 209.2 (H) 11/05/2014   LDLCALC 72 09/30/2016   CREATININE 8.37 (H) 04/06/2017       Scheduled Meds: . amLODipine  5 mg Oral QHS  . cinacalcet  60 mg Oral Q breakfast  . darbepoetin (ARANESP) injection - DIALYSIS  40 mcg Intravenous Q Thu-HD  . doxercalciferol  2 mcg Intravenous Q T,Th,Sa-HD  . feeding supplement (NEPRO CARB STEADY)  237 mL Oral BID BM  . insulin aspart  0-9 Units Subcutaneous TID WC  . lanthanum  1,000 mg Oral TID WC  . losartan  25 mg Oral QHS  . multivitamin  1 tablet Oral QHS  . warfarin  10 mg Oral ONCE-1800  . warfarin   Does not apply Once  . Warfarin - Pharmacist Dosing Inpatient   Does not apply q1800   Continuous Infusions: . sodium chloride    . sodium chloride    . heparin 1,250 Units/hr (04/07/17 1728)     LOS: 5 days    Time spent: [place]   Verneita Griffes,  MD [facility] [phone]

## 2017-04-08 NOTE — Progress Notes (Addendum)
ANTICOAGULATION CONSULT NOTE - Follow Up Consult  Pharmacy Consult for Heparin + Warfarin  Indication: B/L pulmonary emboli + RLE DVT  Allergies  Allergen Reactions  . No Known Allergies     Patient Measurements: Height: 5\' 11"  (180.3 cm) Weight: 205 lb 7.5 oz (93.2 kg) IBW/kg (Calculated) : 75.3  Vital Signs: Temp: 98.1 F (36.7 C) (08/06 0925) Temp Source: Oral (08/06 0925) BP: 143/89 (08/06 0925) Pulse Rate: 94 (08/06 0925)  Labs:  Recent Labs  04/06/17 0529 04/06/17 0831 04/06/17 5784 04/07/17 0528 04/08/17 0532  HGB 9.9*  --  10.0* 10.2* 9.8*  HCT 30.1*  --  29.9* 31.3* 30.2*  PLT 329  --  343 301 332  LABPROT 13.5  --   --  13.6 18.0*  INR 1.03  --   --  1.04 1.47  HEPARINUNFRC 0.56  --   --  0.62 0.27*  CREATININE  --  8.37*  --   --   --     Estimated Creatinine Clearance: 11.6 mL/min (A) (by C-G formula based on SCr of 8.37 mg/dL (H)).  Medications: Heparin @ 1250 units/hr (12.5 ml/hr)  Assessment: [name] on heparin for new bilateral PE and RLE DVT. Warfarin initiation was delayed due to fistulogram by [facility] on 8/2 which showed a patent fistula. Warfarin restarted 8/3.   Heparin level this morning was 0.27, slightly below goal after heparin drip resumed at 1250 units/hr last night post TDC removal. Heparin levels have previously been therapeutic on this rate 1250 units/hr. I will bump rate up slightly to target therapeutic goal 0.3-0.7.   INR today = 1.47 remains SUBtherapeutic but has increased today. (INR 1.47<<1.04 << 1.03, goal of 2-3).  CBC stable/low - no bleeding noted at this time. Today will be VTE overlap D#4/5 of the 5 day overlap as recommended by the CHEST guidelines.   The patient was educated on warfarin this admission.  Goal of Therapy:  Heparin level 0.3-0.7 INR 2-3 Monitor platelets by anticoagulation protocol: Yes   Plan:  Increase Heparin to 1300 units/hr (13 ml/hr)  Warfarin 10 mg x 1 dose at 1800 today Continue to monitor  for any signs/symptoms of bleeding and will follow up with heparin level and PT/INR in the a.m.  Need outpatient INR follow up: Case Manager documented 8/6: pt of [place] [facility] [employer]. He is aware of the need for hospital followup and labs for Coumadin monitoring. Pt wishes to make his own appointment for followup. Importance of followup was discussed with the pt who verbalized understanding.    Thank you for allowing pharmacy to be a part of this patient's care.  Nicole Cella, RPh Clinical Pharmacist Pager: [phone]  Clinical phone for 04/08/2017 from 7a-3:30p: [phone] 3:30p-1030p, please call [phone] or  main pharmacy at: [phone] 04/08/2017 12:54 PM

## 2017-04-09 LAB — RENAL FUNCTION PANEL
Albumin: 3.4 g/dL — ABNORMAL LOW (ref 3.5–5.0)
Anion gap: 13 (ref 5–15)
BUN: 55 mg/dL — AB (ref 6–20)
CHLORIDE: 98 mmol/L — AB (ref 101–111)
CO2: 23 mmol/L (ref 22–32)
CREATININE: 9.65 mg/dL — AB (ref 0.61–1.24)
Calcium: 8.4 mg/dL — ABNORMAL LOW (ref 8.9–10.3)
GFR calc Af Amer: 6 mL/min — ABNORMAL LOW (ref >60)
GFR calc non Af Amer: 5 mL/min — ABNORMAL LOW (ref >60)
GLUCOSE: 137 mg/dL — AB (ref 65–99)
POTASSIUM: 4.5 mmol/L (ref 3.5–5.1)
Phosphorus: 4 mg/dL (ref 2.5–4.6)
Sodium: 134 mmol/L — ABNORMAL LOW (ref 135–145)

## 2017-04-09 LAB — CBC
HEMATOCRIT: 30.5 % — AB (ref 39.0–52.0)
Hemoglobin: 10 g/dL — ABNORMAL LOW (ref 13.0–17.0)
MCH: 31.7 pg (ref 26.0–34.0)
MCHC: 32.8 g/dL (ref 30.0–36.0)
MCV: 96.8 fL (ref 78.0–100.0)
Platelets: 361 10*3/uL (ref 150–400)
RBC: 3.15 MIL/uL — ABNORMAL LOW (ref 4.22–5.81)
RDW: 16.1 % — AB (ref 11.5–15.5)
WBC: 15.1 10*3/uL — AB (ref 4.0–10.5)

## 2017-04-09 LAB — PROTIME-INR
INR: 1.87
PROTHROMBIN TIME: 21.8 s — AB (ref 11.4–15.2)

## 2017-04-09 LAB — GLUCOSE, CAPILLARY
GLUCOSE-CAPILLARY: 157 mg/dL — AB (ref 65–99)
Glucose-Capillary: 188 mg/dL — ABNORMAL HIGH (ref 65–99)
Glucose-Capillary: 129 mg/dL — ABNORMAL HIGH (ref 65–99)

## 2017-04-09 LAB — HEPARIN LEVEL (UNFRACTIONATED): HEPARIN UNFRACTIONATED: 0.64 [IU]/mL (ref 0.30–0.70)

## 2017-04-09 MED ORDER — DOXERCALCIFEROL 4 MCG/2ML IV SOLN
INTRAVENOUS | Status: AC
  Filled 2017-04-09: qty 2

## 2017-04-09 MED ORDER — WARFARIN SODIUM 7.5 MG PO TABS
7.5000 mg | ORAL_TABLET | Freq: Once | ORAL | Status: AC
  Administered 2017-04-09: 7.5 mg via ORAL
  Filled 2017-04-09: qty 1

## 2017-04-09 NOTE — Progress Notes (Signed)
ANTICOAGULATION CONSULT NOTE - Follow Up Consult  Pharmacy Consult for Heparin + Warfarin  Indication: B/L pulmonary emboli + RLE DVT  Allergies  Allergen Reactions  . No Known Allergies     Patient Measurements: Height: 5\' 11"  (180.3 cm) Weight: 200 lb 9.9 oz (91 kg) IBW/kg (Calculated) : 75.3 kg  Vital Signs: Temp: 98 F (36.7 C) (08/07 1149) Temp Source: Oral (08/07 1149) BP: 116/77 (08/07 1149) Pulse Rate: 82 (08/07 1149)  Labs:  Recent Labs  04/07/17 0528 04/08/17 0532 04/08/17 1814 04/09/17 0441 04/09/17 0727  HGB 10.2* 9.8*  --  10.0*  --   HCT 31.3* 30.2*  --  30.5*  --   PLT 301 332  --  361  --   LABPROT 13.6 18.0*  --  21.8*  --   INR 1.04 1.47  --  1.87  --   HEPARINUNFRC 0.62 0.27* 0.57 0.64  --   CREATININE  --   --   --   --  9.65*    Estimated Creatinine Clearance: 10 mL/min (A) (by C-G formula based on SCr of 9.65 mg/dL (H)).  Medications: Heparin @ 1300 units13/hr (13 ml/hr)  Assessment: [name] on heparin for new bilateral PE and RLE DVT, on heparin bridge to coumadin.  Heparin level 0.64, remains therapeutic on heparin infusion 1300 units/hr.  INR 1.87, trending up toward goal 2-3 on overlap day#5, still need INR therapeutic >24 hrs before we can stop the heparin drip. CBC is stable with Hgb 10.0 and pltc wnl. No bleeding noted.   Goal of Therapy:  Heparin level 0.3-0.7 INR 2-3 Monitor platelets by anticoagulation protocol: Yes   Plan:  Continue heparin at current rate 1300 units/hr. Coumadin 7.5 mg today x1. Daily HL, INR and CBC  Nicole Cella, RPh Clinical Pharmacist Pager: [phone]  4P-10P [place] [phone]  04/09/2017 11:59 AM

## 2017-04-09 NOTE — Procedures (Signed)
Stable on HD.  INR up 1.8 today.  BF 350, VP 200.     I was present at this dialysis session, have reviewed the session itself and made  appropriate changes Kelly Splinter MD [place] pager [phone]   04/09/2017, 9:41 AM

## 2017-04-09 NOTE — Progress Notes (Signed)
[facility] PROGRESS NOTE   [name], MD   Assessment/Plan: Principal Problem:   Pulmonary embolism ([place]) Active Problems:   Essential hypertension   Non-insulin-dependent diabetes mellitus with renal complications (HCC)   End stage renal disease on dialysis ([place])   ESRD on dialysis ([place])   Left atrial mass   79 Hispanic origin with past medical history significant for hypertension,  type 2 diabetes mellitus and end-stage renal disease on hemodialysis who presents with 2 day history of sudden onset of pleuritic type chest pain. CT scan of the chest showed small partially occlusive bilateral lower lobe pulmonary artery emboli with no evidence of right heart strain, Dopplers of the lower extremities was positive for DVT--admitted for further management of thromboembolism.  Patient recently underwent failed thrombectomy of right brachiocephalic fistula because of large retained thrombus , status post right IJ tunneled hemodialysis catheter through which he has been getting his dialysis.  He is apparently living in a sleter but has plans to reconcile with his ex-weife in [place] and might go and live with her in the future   #1 Bilateral Pulmonary embolism ([place]):  Likely secondary to recent surgery 2-D echo 04/03/2017 -EF 62%, grade 1 diastolic dysfunction, basal inferior hypokinesis Mass noted in the right atrium likely dialysis catheter tip per cardiology Coumadin, with heparin gtt bridge as per Pharm D with 48 hours overlap of Hep -->coumadin  #2 Essential hypertension:  Cozaar 25 od and Norvasc 5 mg od  #3 End-stage renal disease on hemodialysis T /Th/Sat Anemia of renal disease Adjust dialysate content for hyponatremia Continue dialysis per nephrology Sensipar, Fosrenol and Hectoral per nephro Continue Aranesp 40 mcg q thurs  #4 Non-insulin-dependent diabetes mellitus with renal complications ([place]):  H4T 7.8% -04/03/2017 Continue SSI Sugars are stable  [id number]  #5 Leukocytosis:  No evidence of acute infection clinically Likely stress de-margination Continue to follow.  #6 Elevated troponins:  Mild.  No evidence of cardiac ischemia clinically; (+) renal dysfunction    DVT prophylaxsis heparin/Coumadin  Code Status: full code   Family Communication: Discussed in detail with the patient, all imaging results, lab results explained to the patient   Disposition Plan:  As per nephrology , pending INR to be therapeutic with 5 days of overlap      Consultants:  Nephrology  Vascular    Procedures:  Fistulogram  Antibiotics: Anti-infectives    None      HPI/Subjective:  Awake alert pleasant Feels a little washed out from dialysis Took 3 L off and is down to his EDW of 91 Had some pleuritic chest pain overnight however now much better  Objective: Vitals:   04/09/17 1000 04/09/17 1030 04/09/17 1053 04/09/17 1149  BP: 133/79 107/63 106/67 116/77  Pulse: 99 (!) 102 83 82  Resp:   18 20  Temp:   97.6 F (36.4 C) 98 F (36.7 C)  TempSrc:   Oral Oral  SpO2:   97% 98%  Weight:   91 kg (200 lb 9.9 oz)   Height:        Intake/Output Summary (Last 24 hours) at 04/09/17 1231 Last data filed at 04/09/17 1146  Gross per 24 hour  Intake           928.87 ml  Output             3900 ml  Net         -2971.13 ml    Exam:  Examination:  Pleasant alert oriented No pallor no icterus  Chest clinically clear Abdomen soft no rebound No lower extremity edema Pulses fine  Data Reviewed: I have personally reviewed following labs and imaging studies  Micro Results Recent Results (from the past 240 hour(s))  MRSA PCR Screening     Status: None   Collection Time: 04/02/17  9:02 PM  Result Value Ref Range Status   MRSA by PCR NEGATIVE NEGATIVE Final    Comment:        The GeneXpert MRSA Assay (FDA approved for NASAL specimens only), is one component of a comprehensive MRSA colonization surveillance program. It  is not intended to diagnose MRSA infection nor to guide or monitor treatment for MRSA infections.     Radiology Reports Dg Chest 2 View  Result Date: 04/02/2017 CLINICAL DATA:  56 year old male with centralized chest pain. EXAM: CHEST  2 VIEW COMPARISON:  Chest radiograph dated 02/05/2017 FINDINGS: Right IJ dialysis catheter with tip over right atrium. There is top-normal cardiac size. No vascular congestion or edema. Minimal right lung base atelectatic changes noted. No focal consolidation, pleural effusion, or pneumothorax. No acute osseous pathology. IMPRESSION: 1. No acute cardiopulmonary process. Minimal right lung base atelectatic changes. 2. Top-normal cardiac size.  No vascular congestion or edema. 3. Minimal right lung base atelectatic changes. No focal consolidation. Electronically Signed   By: Anner Crete M.D.   On: 04/02/2017 02:23   Ct Angio Chest Pe W And/or Wo Contrast  Result Date: 04/02/2017 CLINICAL DATA:  56 year old male with history of diabetes and CHF presenting with chest pain. EXAM: CT ANGIOGRAPHY CHEST WITH CONTRAST TECHNIQUE: Multidetector CT imaging of the chest was performed using the standard protocol during bolus administration of intravenous contrast. Multiplanar CT image reconstructions and MIPs were obtained to evaluate the vascular anatomy. CONTRAST:  100 cc Isovue 370 COMPARISON:  Chest radiograph dated 04/02/2017 chest CT dated 10/01/2016 FINDINGS: Cardiovascular: There is mild cardiomegaly. No pericardial effusion. There is multi vessel coronary vascular calcification involving the left main, lad, RCA, and left circumflex artery. A right IJ dialysis catheter is seen with tip in the right atrium. The thoracic aorta appears unremarkable for the degree of enhancement. Small partially occlusive bilateral lower lobe segmental pulmonary emboli noted. This finding is new compared to the CT of 10/01/2016. There is no CT evidence of right heart straining  Mediastinum/Nodes: No hilar or mediastinal adenopathy. The esophagus and the thyroid gland are grossly unremarkable. Lungs/Pleura: The lungs are clear. There is no pleural effusion or pneumothorax. The central airways are patent. Upper Abdomen: No acute abnormality. Musculoskeletal: Mild degenerative changes of the spine. No acute osseous pathology. Review of the MIP images confirms the above findings. IMPRESSION: 1. Small partially occlusive bilateral lower lobe pulmonary artery emboli. No CT evidence of right heart straining. 2. Mild cardiomegaly. Advanced multi vessel coronary vascular calcification. Critical Value/emergent results were called by telephone at the time of interpretation on 04/02/2017 at 3:43 am to Dr. Randal Buba, who verbally acknowledged these results. Electronically Signed   By: Anner Crete M.D.   On: 04/02/2017 03:45   Dg Foot Complete Right  Result Date: 03/11/2017 Please see detailed radiograph report in office note.  Ir Dialy Shunt Intro Needle/intracath Initial W/img Right  Result Date: 04/04/2017 INDICATION: Evaluate for venous stenosis in the right upper extremity. Patient has a right upper arm fistula. EXAM: RIGHT UPPER EXTREMITY FISTULOGRAM MEDICATIONS: None. ANESTHESIA/SEDATION: None FLUOROSCOPY TIME:  Fluoroscopy Time: 32 seconds, 15 mGy CONTRAST:  32 ml [id number] COMPLICATIONS: None immediate. PROCEDURE: The right upper extremity fistula was  accessed with an Angiocath. Series of fistulogram images were obtained. Catheter was removed with manual compression. FINDINGS: The fistula was accessed just above the elbow. The draining vein is mild irregularity and mildly aneurysmal near the access site. However, there is no significant stenosis. Central veins are patent. Arterial anastomosis is patent. Right jugular dialysis catheter extending into the right atrium. IMPRESSION: Right upper arm fistula is patent without significant stenosis. 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 ACCESS: This access remains amenable to future percutaneous interventions as clinically indicated. Electronically Signed   By: Markus Daft M.D.   On: 04/04/2017 16:26     CBC  Recent Labs Lab 04/06/17 0529 04/06/17 0832 04/07/17 0528 04/08/17 0532 04/09/17 0441  WBC 13.0* 13.5* 12.5* 13.8* 15.1*  HGB 9.9* 10.0* 10.2* 9.8* 10.0*  HCT 30.1* 29.9* 31.3* 30.2* 30.5*  PLT 329 343 301 332 361  MCV 94.4 95.5 95.7 97.4 96.8  MCH 31.0 31.9 31.2 31.6 31.7  MCHC 32.9 33.4 32.6 32.5 32.8  RDW 15.5 16.0* 15.8* 16.0* 16.1*    Chemistries   Recent Labs Lab 04/04/17 0400 04/04/17 0715 04/06/17 0831 04/09/17 0727  NA 135 134* 129* 134*  K 3.7 3.9 4.3 4.5  CL 100* 99* 93* 98*  CO2 24 25 24 23   GLUCOSE 121* 103* 124* 137*  BUN 40* 39* 44* 55*  CREATININE 7.19* 7.31* 8.37* 9.65*  CALCIUM 7.9* 7.9* 8.3* 8.4*  AST 17  --   --   --   ALT 12*  --   --   --   ALKPHOS 79  --   --   --   BILITOT 0.6  --   --   --    ------------------------------------------------------------------------------------------------------------------ estimated creatinine clearance is 10 mL/min (A) (by C-G formula based on SCr of 9.65 mg/dL (H)). ------------------------------------------------------------------------------------------------------------------ No results for input(s): HGBA1C in the last 72 hours. ------------------------------------------------------------------------------------------------------------------ No results for input(s): CHOL, HDL, LDLCALC, TRIG, CHOLHDL, LDLDIRECT in the last 72 hours. ------------------------------------------------------------------------------------------------------------------ No results for input(s): TSH, T4TOTAL, T3FREE, THYROIDAB in the last 72 hours.  Invalid input(s): FREET3 ------------------------------------------------------------------------------------------------------------------ No results for input(s): VITAMINB12,  FOLATE, FERRITIN, TIBC, IRON, RETICCTPCT in the last 72 hours.  Coagulation profile  Recent Labs Lab 04/05/17 0513 04/06/17 0529 04/07/17 0528 04/08/17 0532 04/09/17 0441  INR 1.00 1.03 1.04 1.47 1.87    No results for input(s): DDIMER in the last 72 hours.  Cardiac Enzymes  Recent Labs Lab 04/02/17 1429 04/02/17 2025  TROPONINI 0.13* 0.13*   ------------------------------------------------------------------------------------------------------------------ Invalid input(s): POCBNP   CBG:  Recent Labs Lab 04/08/17 0804 04/08/17 1211 04/08/17 1637 04/08/17 2039 04/09/17 1141  GLUCAP 111* 213* 87 172* 157*       Studies: No results found.   Lab Results  Component Value Date   HGBA1C 7.8 (H) 04/03/2017   HGBA1C 7.1 (H) 09/29/2016   HGBA1C 7.5 07/20/2016   Lab Results  Component Value Date   MICROALBUR 209.2 (H) 11/05/2014   LDLCALC 72 09/30/2016   CREATININE 9.65 (H) 04/09/2017     Scheduled Meds: . amLODipine  5 mg Oral QHS  . cinacalcet  60 mg Oral Q breakfast  . darbepoetin (ARANESP) injection - DIALYSIS  40 mcg Intravenous Q Thu-HD  . doxercalciferol      . doxercalciferol  2 mcg Intravenous Q T,Th,Sa-HD  . feeding supplement (NEPRO CARB STEADY)  237 mL Oral BID BM  . insulin aspart  0-9 Units Subcutaneous TID WC  . lanthanum  1,000  mg Oral TID WC  . losartan  25 mg Oral QHS  . multivitamin  1 tablet Oral QHS  . warfarin  7.5 mg Oral ONCE-1800  . Warfarin - Pharmacist Dosing Inpatient   Does not apply q1800   Continuous Infusions: . heparin 1,300 Units/hr (04/09/17 0958)     LOS: 6 days    Time spent: [place], MD [facility] [phone]

## 2017-04-10 DIAGNOSIS — I2699 Other pulmonary embolism without acute cor pulmonale: Principal | ICD-10-CM

## 2017-04-10 LAB — PROTIME-INR
INR: 2.22
Prothrombin Time: 24.9 seconds — ABNORMAL HIGH (ref 11.4–15.2)

## 2017-04-10 LAB — CBC
HEMATOCRIT: 32.1 % — AB (ref 39.0–52.0)
HEMOGLOBIN: 10.5 g/dL — AB (ref 13.0–17.0)
MCH: 31.8 pg (ref 26.0–34.0)
MCHC: 32.7 g/dL (ref 30.0–36.0)
MCV: 97.3 fL (ref 78.0–100.0)
Platelets: 413 10*3/uL — ABNORMAL HIGH (ref 150–400)
RBC: 3.3 MIL/uL — ABNORMAL LOW (ref 4.22–5.81)
RDW: 16.4 % — ABNORMAL HIGH (ref 11.5–15.5)
WBC: 17.3 10*3/uL — ABNORMAL HIGH (ref 4.0–10.5)

## 2017-04-10 LAB — GLUCOSE, CAPILLARY
GLUCOSE-CAPILLARY: 169 mg/dL — AB (ref 65–99)
GLUCOSE-CAPILLARY: 112 mg/dL — AB (ref 65–99)
Glucose-Capillary: 107 mg/dL — ABNORMAL HIGH (ref 65–99)
Glucose-Capillary: 180 mg/dL — ABNORMAL HIGH (ref 65–99)

## 2017-04-10 LAB — HEPARIN LEVEL (UNFRACTIONATED): Heparin Unfractionated: 0.6 IU/mL (ref 0.30–0.70)

## 2017-04-10 MED ORDER — POLYETHYLENE GLYCOL 3350 17 G PO PACK
17.0000 g | PACK | Freq: Two times a day (BID) | ORAL | Status: DC
  Administered 2017-04-10 – 2017-04-11 (×2): 17 g via ORAL
  Filled 2017-04-10 (×2): qty 1

## 2017-04-10 MED ORDER — SENNA 8.6 MG PO TABS
2.0000 | ORAL_TABLET | Freq: Every day | ORAL | Status: DC
  Administered 2017-04-10: 17.2 mg via ORAL
  Filled 2017-04-10 (×2): qty 2

## 2017-04-10 MED ORDER — WARFARIN SODIUM 7.5 MG PO TABS
7.5000 mg | ORAL_TABLET | Freq: Once | ORAL | Status: AC
  Administered 2017-04-10: 7.5 mg via ORAL
  Filled 2017-04-10: qty 1

## 2017-04-10 NOTE — Progress Notes (Signed)
[facility] Progress Note   Subjective:  Feels well, no dyspnea or CP. INR up to 2.2 today.  Objective Vitals:   04/09/17 1708 04/09/17 2132 04/10/17 0431 04/10/17 0905  BP: 119/77 134/78 118/76 122/72  Pulse: 96 97 85 85  Resp: 18 19 18 18   Temp: 99.1 F (37.3 C) 98.6 F (37 C) 98.6 F (37 C) 98.5 F (36.9 C)  TempSrc: Oral Oral Oral Oral  SpO2: 94% 97% 96% 99%  Weight:  91 kg (200 lb 9.9 oz)    Height:       Physical Exam General: Well appearing male, NAD. Heart: RRR; no murmur. Lungs: CTAB Extremities: No LE edema. Dialysis Access:  RUE AVF + thrill, TDC out: site without erythema/drainage  Additional Objective Labs: Basic Metabolic Panel:  Recent Labs Lab 04/04/17 0715 04/06/17 0831 04/09/17 0727  NA 134* 129* 134*  K 3.9 4.3 4.5  CL 99* 93* 98*  CO2 25 24 23   GLUCOSE 103* 124* 137*  BUN 39* 44* 55*  CREATININE 7.31* 8.37* 9.65*  CALCIUM 7.9* 8.3* 8.4*  PHOS 5.6* 4.6 4.0   Liver Function Tests:  Recent Labs Lab 04/04/17 0400 04/04/17 0715 04/06/17 0831 04/09/17 0727  AST 17  --   --   --   ALT 12*  --   --   --   ALKPHOS 79  --   --   --   BILITOT 0.6  --   --   --   PROT 6.9  --   --   --   ALBUMIN 3.3* 3.3* 3.5 3.4*   CBC:  Recent Labs Lab 04/06/17 0832 04/07/17 0528 04/08/17 0532 04/09/17 0441 04/10/17 0430  WBC 13.5* 12.5* 13.8* 15.1* 17.3*  HGB 10.0* 10.2* 9.8* 10.0* 10.5*  HCT 29.9* 31.3* 30.2* 30.5* 32.1*  MCV 95.5 95.7 97.4 96.8 97.3  PLT 343 301 332 361 413*   CBG:  Recent Labs Lab 04/08/17 2039 04/09/17 1141 04/09/17 1634 04/09/17 2131 04/10/17 0748  GLUCAP 172* 157* 188* 129* 107*   Medications: . heparin 1,300 Units/hr (04/09/17 2306)   . amLODipine  5 mg Oral QHS  . cinacalcet  60 mg Oral Q breakfast  . darbepoetin (ARANESP) injection - DIALYSIS  40 mcg Intravenous Q Thu-HD  . doxercalciferol  2 mcg Intravenous Q T,Th,Sa-HD  . feeding supplement (NEPRO CARB STEADY)  237 mL Oral BID BM  .  insulin aspart  0-9 Units Subcutaneous TID WC  . lanthanum  1,000 mg Oral TID WC  . losartan  25 mg Oral QHS  . multivitamin  1 tablet Oral QHS  . warfarin  7.5 mg Oral ONCE-1800  . Warfarin - Pharmacist Dosing Inpatient   Does not apply q1800    Dialysis Orders: TTS at [facility] 4 hrs 180 NRe 90 kg, 400/800 2.0 K/ 2.5 Ca, Heparin 6000 units IV TIW  Mircera 50 mcg IV Q 2 week (last dose 03/28/17) Venofer 50 mg IV weekly (last dose 03/28/17)  Assessment/Plan: 1. Bilateral Pulmonary Embolism/ R femoral vein DVT/R TDC tip thrombus: TDC removed while here. Will need long-term anticoagulation, started coumadin 04/05/17. INR up to 2.2 today, still on IV heparin. 2. ESRD: Continue TTS schedule, next 8/9. AVF in use again, [facility] has been removed. 3. Anemia: Hgb 10.5. Continue Aranesp 40 mcg weekly. Monitor. 4. Secondary hyperparathyroidism: Labs controlled. Continue binders, sensipar. VDRA had been on hold, now resumed Hectoral 2 mcg IV TIW.  5. HTN/volume: BP fine, EDW raised to 90.5kg.Cont amlodipine  and losartan.  6. Nutrition: Albumin 3.4. Continue renal diet and Nepro. 7. AV Access: Has aneurysmal RUE AVF which was found to be thrombosed 2 weeks PTA. Underwent failed thrombectomy (large burden of retained thrombus) at [facility] and R TDC placed at the time. On admit here, noted that the R AVF appeared patent with active thrill/bruit. Fistulogram patent with mild irregularities by IR 04/04/17, able to be used successfully 8/3. TDC removed 8/5. 8. DM: On SSI, per primary. 9. Leukocytosis: WBC still rising, unclear why. Afebrile. No signs/symptoms of active infection, will need to watch closely. Will consider BCx with next HD (he did have short-term TDC).   Veneta Penton, PA-C 04/10/2017, 10:13 AM  [facility] Pager: [phone]  Pt seen, examined and agree w A/P as above. INR is therapeutic, ok for dc from renal standpoint.  Kelly Splinter MD [employer] pager [phone]    04/10/2017, 12:02 PM

## 2017-04-10 NOTE — Progress Notes (Signed)
ANTICOAGULATION CONSULT NOTE - Follow Up Consult  Pharmacy Consult for Heparin + Warfarin  Indication: B/L pulmonary emboli + RLE DVT  Allergies  Allergen Reactions  . No Known Allergies     Patient Measurements: Height: 5\' 11"  (180.3 cm) Weight: 200 lb 9.9 oz (91 kg) IBW/kg (Calculated) : 75.3 kg  Vital Signs: Temp: 98.6 F (37 C) (08/08 0431) Temp Source: Oral (08/08 0431) BP: 118/76 (08/08 0431) Pulse Rate: 85 (08/08 0431)  Labs:  Recent Labs  04/08/17 0532 04/08/17 1814 04/09/17 0441 04/09/17 0727 04/10/17 0430  HGB 9.8*  --  10.0*  --  10.5*  HCT 30.2*  --  30.5*  --  32.1*  PLT 332  --  361  --  413*  LABPROT 18.0*  --  21.8*  --  24.9*  INR 1.47  --  1.87  --  2.22  HEPARINUNFRC 0.27* 0.57 0.64  --  0.60  CREATININE  --   --   --  9.65*  --     Estimated Creatinine Clearance: 10 mL/min (A) (by C-G formula based on SCr of 9.65 mg/dL (H)).  Medications: Heparin @ 1300 units13/hr (13 ml/hr)  Assessment: [name] on heparin for new bilateral PE and RLE DVT, on heparin bridge to coumadin.  Heparin level 0.6, remains therapeutic on heparin infusion 1300 units/hr.  INR 2.22, on overlap day#6/5, BUT still need INR therapeutic >24 hrs before we can stop the heparin drip.  Hopefully will remain >2 tomorrow and heparin can be stopped then. CBC is stable with Hgb 10.5 and pltc wnl. No bleeding noted.   Goal of Therapy:  Heparin level 0.3-0.7 INR 2-3 Monitor platelets by anticoagulation protocol: Yes   Plan:  Continue heparin at current rate 1300 units/hr. Coumadin 7.5 mg today x1. At discharge recommend Coumadin 7.5 mg daily for now.  May eventually need 10 mg one or two days a week.  Would check INR as outpatient within a few days of discharge. Daily heparin level, INR and CBC  [place] Crane, Vena [place], BCPS  Clinical Pharmacist Pager [phone]  04/10/2017 8:22 AM

## 2017-04-10 NOTE — Progress Notes (Signed)
[facility] PROGRESS NOTE   Ronald Nearing, MD   Assessment/Plan: Principal Problem:   Pulmonary embolism ([place]) Active Problems:   Essential hypertension   Non-insulin-dependent diabetes mellitus with renal complications (HCC)   End stage renal disease on dialysis ([place])   ESRD on dialysis ([place])   Left atrial mass   56 Hispanic origin with past medical history significant for hypertension,  type 2 diabetes mellitus and end-stage renal disease on hemodialysis who presents with 2 day history of sudden onset of pleuritic type chest pain. CT scan of the chest showed small partially occlusive bilateral lower lobe pulmonary artery emboli with no evidence of right heart strain, Dopplers of the lower extremities was positive for DVT--admitted for further management of thromboembolism.  Patient recently underwent failed thrombectomy of right brachiocephalic fistula because of large retained thrombus , status post right IJ tunneled hemodialysis catheter through which he has been getting his dialysis.  He is apparently living in a shelter but has plans to reconcile with his ex-wife in [place] and might go and live with her in the future   #1 Bilateral Pulmonary embolism (HCC)/right femoral DVT/right TDC tip thrombus:  Likely secondary to recent surgery 2-D echo 04/03/2017 -EF 81%, grade 1 diastolic dysfunction, basal inferior hypokinesis Mass noted in the right atrium likely dialysis catheter tip per cardiology Coumadin, with heparin gtt bridge as per Pharm D with 48 hours overlap of Hep -->coumadin INR therapeutic greater than 2 for the first day today. Continue IV heparin bridge for additional 24 hours and discontinue in a.m. if INR remains >2. Need to discuss with case management to arrange outpatient close follow-up for INR checks and Coumadin dose adjustments. TDC removed while here.  #2 Essential hypertension:  Cozaar 25 od and Norvasc 5 mg od. Controlled.  #3 End-stage renal  disease on hemodialysis T /Th/Sat Anemia of renal disease Nephrology following. Next HD 8/9. TDC removed. AVF in use again. Continue Aranesp Has aneurysmal RUE aVF which was found to be thrombosed 2 weeks PTA. Underwent failed thrombectomy (large burden of retained thrombus) [facility] at that time. On admit here, noted that the right aVF appeared patent with active thrill/bruit. TDC removed 8/5.  #4 Non-insulin-dependent diabetes mellitus with renal complications ([place]):  W2X 7.8% -04/03/2017 Continue SSI Reasonable inpatient control.  #5 Leukocytosis:  No evidence of acute infection clinically Continues to rise. As per nephrology, will consider blood cultures at Dialysis.  #6 Elevated troponins:  Mild.  No evidence of cardiac ischemia clinically; (+) renal dysfunction. May consider outpatient cardiology consultation to evaluate basal hypokinesis.  Anemia Secondary to ESRD.  Constipation Bowel regimen.    DVT prophylaxsis heparin/Coumadin  Code Status: full code   Family Communication: None at bedside.  Disposition Plan:  Likely discharge home 8/9.      Consultants:  Nephrology  Vascular    Procedures:  Fistulogram  HD  Antibiotics: Anti-infectives    None      HPI/Subjective:  No chest pain or dyspnea reported. Had some midabdominal pain overnight and nausea without vomiting. Reports hard stools and BM. Abdominal pain improved this morning.  Objective: Vitals:   04/09/17 2132 04/10/17 0431 04/10/17 0905 04/10/17 1710  BP: 134/78 118/76 122/72 119/70  Pulse: 97 85 85 80  Resp: 19 18 18 18   Temp: 98.6 F (37 C) 98.6 F (37 C) 98.5 F (36.9 C) 98.5 F (36.9 C)  TempSrc: Oral Oral Oral Oral  SpO2: 97% 96% 99% 99%  Weight: 91 kg (200 lb 9.9 oz)     Height:        Intake/Output Summary (Last 24 hours) at 04/10/17 1919 Last data filed at 04/10/17 1847  Gross per 24 hour  Intake             1479 ml   Output             1550 ml  Net              -71 ml    Exam:  Examination:  Gen. exam: Pleasant young male lying comfortably supine in bed. Respiratory system: Clear to auscultation. No increased work of breathing. Cardiovascular system: S1 and S2 heard, RRR. No JVD, murmurs or pedal edema. Abdomen: Nondistended, soft and nontender. Normal bowel sounds heard. CNS: Alert and oriented. No focal neurological deficits. Extremities: No asymmetric swelling.  Data Reviewed: I have personally reviewed following labs and imaging studies  Micro Results Recent Results (from the past 240 hour(s))  MRSA PCR Screening     Status: None   Collection Time: 04/02/17  9:02 PM  Result Value Ref Range Status   MRSA by PCR NEGATIVE NEGATIVE Final    Comment:        The GeneXpert MRSA Assay (FDA approved for NASAL specimens only), is one component of a comprehensive MRSA colonization surveillance program. It is not intended to diagnose MRSA infection nor to guide or monitor treatment for MRSA infections.     Radiology Reports Dg Chest 2 View  Result Date: 04/02/2017 CLINICAL DATA:  56 year old male with centralized chest pain. EXAM: CHEST  2 VIEW COMPARISON:  Chest radiograph dated 02/05/2017 FINDINGS: Right IJ dialysis catheter with tip over right atrium. There is top-normal cardiac size. No vascular congestion or edema. Minimal right lung base atelectatic changes noted. No focal consolidation, pleural effusion, or pneumothorax. No acute osseous pathology. IMPRESSION: 1. No acute cardiopulmonary process. Minimal right lung base atelectatic changes. 2. Top-normal cardiac size.  No vascular congestion or edema. 3. Minimal right lung base atelectatic changes. No focal consolidation. Electronically Signed   By: Anner Crete M.D.   On: 04/02/2017 02:23   Ct Angio Chest Pe W And/or Wo Contrast  Result Date: 04/02/2017 CLINICAL DATA:  56 year old male with history of diabetes and CHF presenting  with chest pain. EXAM: CT ANGIOGRAPHY CHEST WITH CONTRAST TECHNIQUE: Multidetector CT imaging of the chest was performed using the standard protocol during bolus administration of intravenous contrast. Multiplanar CT image reconstructions and MIPs were obtained to evaluate the vascular anatomy. CONTRAST:  100 cc Isovue 370 COMPARISON:  Chest radiograph dated 04/02/2017 chest CT dated 10/01/2016 FINDINGS: Cardiovascular: There is mild cardiomegaly. No pericardial effusion. There is multi vessel coronary vascular calcification involving the left main, lad, RCA, and left circumflex artery. A right IJ dialysis catheter is seen with tip in the right atrium. The thoracic aorta appears unremarkable for the degree of enhancement. Small partially occlusive bilateral lower lobe segmental pulmonary emboli noted. This finding is new compared to the CT of 10/01/2016. There is no CT evidence of right heart straining Mediastinum/Nodes: No hilar or mediastinal adenopathy. The esophagus and the thyroid gland are grossly unremarkable. Lungs/Pleura: The lungs are clear. There is no pleural effusion or pneumothorax. The central airways are patent. Upper Abdomen: No acute abnormality. Musculoskeletal: Mild degenerative changes of the spine. No acute osseous pathology. Review of the MIP images confirms the above findings. IMPRESSION: 1. Small partially occlusive bilateral lower lobe pulmonary artery emboli. No  CT evidence of right heart straining. 2. Mild cardiomegaly. Advanced multi vessel coronary vascular calcification. Critical Value/emergent results were called by telephone at the time of interpretation on 04/02/2017 at 3:43 am to Dr. Randal Buba, who verbally acknowledged these results. Electronically Signed   By: Anner Crete M.D.   On: 04/02/2017 03:45   Ir Dialy Shunt Intro Needle/intracath Initial W/img Right  Result Date: 04/04/2017 INDICATION: Evaluate for venous stenosis in the right upper extremity. Patient has a right  upper arm fistula. EXAM: RIGHT UPPER EXTREMITY FISTULOGRAM MEDICATIONS: None. ANESTHESIA/SEDATION: None FLUOROSCOPY TIME:  Fluoroscopy Time: 32 seconds, 15 mGy CONTRAST:  32 ml [id number] COMPLICATIONS: None immediate. PROCEDURE: The right upper extremity fistula was accessed with an Angiocath. Series of fistulogram images were obtained. Catheter was removed with manual compression. FINDINGS: The fistula was accessed just above the elbow. The draining vein is mild irregularity and mildly aneurysmal near the access site. However, there is no significant stenosis. Central veins are patent. Arterial anastomosis is patent. Right jugular dialysis catheter extending into the right atrium. IMPRESSION: Right upper arm fistula is patent without significant stenosis. 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 ACCESS: This access remains amenable to future percutaneous interventions as clinically indicated. Electronically Signed   By: Markus Daft M.D.   On: 04/04/2017 16:26     CBC  Recent Labs Lab 04/06/17 0832 04/07/17 0528 04/08/17 0532 04/09/17 0441 04/10/17 0430  WBC 13.5* 12.5* 13.8* 15.1* 17.3*  HGB 10.0* 10.2* 9.8* 10.0* 10.5*  HCT 29.9* 31.3* 30.2* 30.5* 32.1*  PLT 343 301 332 361 413*  MCV 95.5 95.7 97.4 96.8 97.3  MCH 31.9 31.2 31.6 31.7 31.8  MCHC 33.4 32.6 32.5 32.8 32.7  RDW 16.0* 15.8* 16.0* 16.1* 16.4*    Chemistries   Recent Labs Lab 04/04/17 0400 04/04/17 0715 04/06/17 0831 04/09/17 0727  NA 135 134* 129* 134*  K 3.7 3.9 4.3 4.5  CL 100* 99* 93* 98*  CO2 24 25 24 23   GLUCOSE 121* 103* 124* 137*  BUN 40* 39* 44* 55*  CREATININE 7.19* 7.31* 8.37* 9.65*  CALCIUM 7.9* 7.9* 8.3* 8.4*  AST 17  --   --   --   ALT 12*  --   --   --   ALKPHOS 79  --   --   --   BILITOT 0.6  --   --   --    ------------------------------------------------------------------------------------------------------------------ estimated creatinine clearance is 10 mL/min (A)  (by C-G formula based on SCr of 9.65 mg/dL (H)). ------------------------------------------------------------------------------------------------------------------ No results for input(s): HGBA1C in the last 72 hours. ------------------------------------------------------------------------------------------------------------------ No results for input(s): CHOL, HDL, LDLCALC, TRIG, CHOLHDL, LDLDIRECT in the last 72 hours. ------------------------------------------------------------------------------------------------------------------ No results for input(s): TSH, T4TOTAL, T3FREE, THYROIDAB in the last 72 hours.  Invalid input(s): FREET3 ------------------------------------------------------------------------------------------------------------------ No results for input(s): VITAMINB12, FOLATE, FERRITIN, TIBC, IRON, RETICCTPCT in the last 72 hours.  Coagulation profile  Recent Labs Lab 04/06/17 0529 04/07/17 0528 04/08/17 0532 04/09/17 0441 04/10/17 0430  INR 1.03 1.04 1.47 1.87 2.22    No results for input(s): DDIMER in the last 72 hours.  Cardiac Enzymes No results for input(s): CKMB, TROPONINI, MYOGLOBIN in the last 168 hours.  Invalid input(s): CK ------------------------------------------------------------------------------------------------------------------ Invalid input(s): POCBNP   CBG:  Recent Labs Lab 04/09/17 1634 04/09/17 2131 04/10/17 0748 04/10/17 1145 04/10/17 1708  GLUCAP 188* 129* 107* 169* 112*       Studies: No results found.   Lab Results  Component Value Date  HGBA1C 7.8 (H) 04/03/2017   HGBA1C 7.1 (H) 09/29/2016   HGBA1C 7.5 07/20/2016   Lab Results  Component Value Date   MICROALBUR 209.2 (H) 11/05/2014   LDLCALC 72 09/30/2016   CREATININE 9.65 (H) 04/09/2017     Scheduled Meds: . amLODipine  5 mg Oral QHS  . cinacalcet  60 mg Oral Q breakfast  . darbepoetin (ARANESP) injection - DIALYSIS  40 mcg Intravenous Q Thu-HD  .  doxercalciferol  2 mcg Intravenous Q T,Th,Sa-HD  . feeding supplement (NEPRO CARB STEADY)  237 mL Oral BID BM  . insulin aspart  0-9 Units Subcutaneous TID WC  . lanthanum  1,000 mg Oral TID WC  . losartan  25 mg Oral QHS  . multivitamin  1 tablet Oral QHS  . Warfarin - Pharmacist Dosing Inpatient   Does not apply q1800   Continuous Infusions: . heparin 1,300 Units/hr (04/09/17 2306)     LOS: 7 days    Time spent: [place], MD, FACP, FHM. [facility] Pager [phone]  If 7PM-7AM, please contact night-coverage [email or web address] Password [facility] 04/10/2017, 7:28 PM

## 2017-04-11 ENCOUNTER — Telehealth: Payer: Self-pay

## 2017-04-11 DIAGNOSIS — E1121 Type 2 diabetes mellitus with diabetic nephropathy: Secondary | ICD-10-CM

## 2017-04-11 DIAGNOSIS — Z992 Dependence on renal dialysis: Secondary | ICD-10-CM

## 2017-04-11 DIAGNOSIS — N186 End stage renal disease: Secondary | ICD-10-CM

## 2017-04-11 DIAGNOSIS — I10 Essential (primary) hypertension: Secondary | ICD-10-CM

## 2017-04-11 LAB — RENAL FUNCTION PANEL
ALBUMIN: 3.4 g/dL — AB (ref 3.5–5.0)
Anion gap: 12 (ref 5–15)
BUN: 54 mg/dL — AB (ref 6–20)
CALCIUM: 8.4 mg/dL — AB (ref 8.9–10.3)
CO2: 25 mmol/L (ref 22–32)
CREATININE: 8.55 mg/dL — AB (ref 0.61–1.24)
Chloride: 96 mmol/L — ABNORMAL LOW (ref 101–111)
GFR calc Af Amer: 7 mL/min — ABNORMAL LOW (ref >60)
GFR, EST NON AFRICAN AMERICAN: 6 mL/min — AB (ref >60)
GLUCOSE: 139 mg/dL — AB (ref 65–99)
PHOSPHORUS: 4.3 mg/dL (ref 2.5–4.6)
POTASSIUM: 4.8 mmol/L (ref 3.5–5.1)
SODIUM: 133 mmol/L — AB (ref 135–145)

## 2017-04-11 LAB — GLUCOSE, CAPILLARY
GLUCOSE-CAPILLARY: 108 mg/dL — AB (ref 65–99)
Glucose-Capillary: 140 mg/dL — ABNORMAL HIGH (ref 65–99)

## 2017-04-11 LAB — PROTIME-INR
INR: 2.47
PROTHROMBIN TIME: 27.2 s — AB (ref 11.4–15.2)

## 2017-04-11 LAB — CBC
HCT: 30.3 % — ABNORMAL LOW (ref 39.0–52.0)
HEMOGLOBIN: 10.1 g/dL — AB (ref 13.0–17.0)
MCH: 32.4 pg (ref 26.0–34.0)
MCHC: 33.3 g/dL (ref 30.0–36.0)
MCV: 97.1 fL (ref 78.0–100.0)
Platelets: 399 10*3/uL (ref 150–400)
RBC: 3.12 MIL/uL — AB (ref 4.22–5.81)
RDW: 16.6 % — ABNORMAL HIGH (ref 11.5–15.5)
WBC: 13.8 10*3/uL — AB (ref 4.0–10.5)

## 2017-04-11 LAB — HEPARIN LEVEL (UNFRACTIONATED): HEPARIN UNFRACTIONATED: 0.52 [IU]/mL (ref 0.30–0.70)

## 2017-04-11 MED ORDER — WARFARIN SODIUM 5 MG PO TABS: 7.5000 mg | ORAL_TABLET | Freq: Every day | ORAL | 0 refills | Status: DC

## 2017-04-11 MED ORDER — RENA-VITE PO TABS: 1.0000 | ORAL_TABLET | Freq: Every day | ORAL | 0 refills | Status: AC

## 2017-04-11 MED ORDER — AMLODIPINE BESYLATE 5 MG PO TABS: 5.0000 mg | ORAL_TABLET | Freq: Every day | ORAL | Status: DC

## 2017-04-11 MED ORDER — LIDOCAINE HCL (PF) 1 % IJ SOLN: 5.0000 mL | INTRAMUSCULAR | Status: DC | PRN

## 2017-04-11 MED ORDER — WARFARIN SODIUM 7.5 MG PO TABS
7.5000 mg | ORAL_TABLET | Freq: Every day | ORAL | Status: DC
  Administered 2017-04-11: 7.5 mg via ORAL
  Filled 2017-04-11: qty 1

## 2017-04-11 MED ORDER — DOXERCALCIFEROL 4 MCG/2ML IV SOLN
INTRAVENOUS | Status: AC
  Administered 2017-04-11: 2 ug via INTRAVENOUS
  Filled 2017-04-11: qty 2

## 2017-04-11 MED ORDER — DARBEPOETIN ALFA 40 MCG/0.4ML IJ SOSY
PREFILLED_SYRINGE | INTRAMUSCULAR | Status: AC
  Administered 2017-04-11: 40 ug via INTRAVENOUS
  Filled 2017-04-11: qty 0.4

## 2017-04-11 MED ORDER — ACETAMINOPHEN 325 MG PO TABS
650.0000 mg | ORAL_TABLET | Freq: Four times a day (QID) | ORAL | Status: DC | PRN
Start: 2017-04-11 — End: 2017-08-04

## 2017-04-11 MED ORDER — PENTAFLUOROPROP-TETRAFLUOROETH EX AERO: 1.0000 "application " | INHALATION_SPRAY | CUTANEOUS | Status: DC | PRN

## 2017-04-11 MED ORDER — SODIUM CHLORIDE 0.9 % IV SOLN: 100.0000 mL | INTRAVENOUS | Status: DC | PRN

## 2017-04-11 MED ORDER — SENNA 8.6 MG PO TABS
2.0000 | ORAL_TABLET | Freq: Every evening | ORAL | 0 refills | Status: DC | PRN
Start: 2017-04-11 — End: 2017-05-10

## 2017-04-11 MED ORDER — LIDOCAINE-PRILOCAINE 2.5-2.5 % EX CREA: 1.0000 "application " | TOPICAL_CREAM | CUTANEOUS | Status: DC | PRN

## 2017-04-11 MED ORDER — ACETAMINOPHEN 325 MG PO TABS
ORAL_TABLET | ORAL | Status: AC
  Administered 2017-04-11: 650 mg via ORAL
  Filled 2017-04-11: qty 2

## 2017-04-11 MED ORDER — POLYETHYLENE GLYCOL 3350 17 G PO PACK: 17.0000 g | PACK | Freq: Every day | ORAL | 0 refills | Status: DC

## 2017-04-11 MED FILL — WARFARIN SODIUM 5 MG TABLET: 5 | 40 days supply | Qty: 60 | Fill #0

## 2017-04-11 MED FILL — POLYETHYLENE GLYCOL 3350: 15 days supply | Qty: 255 | Fill #0

## 2017-04-11 NOTE — Progress Notes (Signed)
Pt. discharged to home with cab voucher. Pt after visit summary reviewed and pt capable of re verbalizing medications and follow up appointments. Pt remains stable. No signs and symptoms of distress. Educated to return to ER in the event of SOB, dizziness, chest pain, or fainting. Mady Gemma, RN

## 2017-04-11 NOTE — Progress Notes (Signed)
[facility] Progress Note   Subjective:  Seen in room, had one episode of vomiting this morning which he attributes to taking all his meds on empty stomach, feels better now. No CP or dyspnea. SW/case manager working to arrange INR checks as outpt.  Objective Vitals:   04/10/17 1710 04/10/17 2017 04/11/17 0433 04/11/17 0949  BP: 119/70 137/85 (!) 132/96 (!) 145/84  Pulse: 80 94 86 86  Resp: 18 18 18 18   Temp: 98.5 F (36.9 C) 98.3 F (36.8 C) 98.1 F (36.7 C) 98.7 F (37.1 C)  TempSrc: Oral Oral Oral Oral  SpO2: 99% 98% 98% 98%  Weight:  90.7 kg (200 lb)    Height:       Physical Exam General: Well appearing male, NAD. Heart: RRR; no murmur. Lungs: CTAB Extremities: No LE edema. Dialysis Access: RUE AVF + thrill, TDC out: site without erythema/drainage  Additional Objective Labs: Basic Metabolic Panel:  Recent Labs Lab 04/06/17 0831 04/09/17 0727  NA 129* 134*  K 4.3 4.5  CL 93* 98*  CO2 24 23  GLUCOSE 124* 137*  BUN 44* 55*  CREATININE 8.37* 9.65*  CALCIUM 8.3* 8.4*  PHOS 4.6 4.0   Liver Function Tests:  Recent Labs Lab 04/06/17 0831 04/09/17 0727  ALBUMIN 3.5 3.4*   CBC:  Recent Labs Lab 04/07/17 0528 04/08/17 0532 04/09/17 0441 04/10/17 0430 04/11/17 0340  WBC 12.5* 13.8* 15.1* 17.3* 13.8*  HGB 10.2* 9.8* 10.0* 10.5* 10.1*  HCT 31.3* 30.2* 30.5* 32.1* 30.3*  MCV 95.7 97.4 96.8 97.3 97.1  PLT 301 332 361 413* 399   CBG:  Recent Labs Lab 04/10/17 1145 04/10/17 1708 04/10/17 2015 04/11/17 0736 04/11/17 1143  GLUCAP 169* 112* 180* 108* 140*   Medications: . sodium chloride    . sodium chloride    . heparin 1,300 Units/hr (04/09/17 2306)   . amLODipine  5 mg Oral QHS  . cinacalcet  60 mg Oral Q breakfast  . darbepoetin (ARANESP) injection - DIALYSIS  40 mcg Intravenous Q Thu-HD  . doxercalciferol  2 mcg Intravenous Q T,Th,Sa-HD  . feeding supplement (NEPRO CARB STEADY)  237 mL Oral BID BM  . insulin aspart  0-9  Units Subcutaneous TID WC  . lanthanum  1,000 mg Oral TID WC  . losartan  25 mg Oral QHS  . multivitamin  1 tablet Oral QHS  . polyethylene glycol  17 g Oral BID  . senna  2 tablet Oral QHS  . warfarin  7.5 mg Oral q1800  . Warfarin - Pharmacist Dosing Inpatient   Does not apply q1800    Dialysis Orders: TTS at [facility] 4 hrs 180 NRe 90 kg, 400/800 2.0 K/ 2.5 Ca, Heparin 6000 units IV TIW  Mircera 50 mcg IV Q 2 week (last dose 03/28/17) Venofer 50 mg IV weekly (last dose 03/28/17)  Assessment/Plan: 1. Bilateral Pulmonary Embolism/ R femoral vein DVT/R TDC tip thrombus:TDC removed while here. Will need long-term anticoagulation, started coumadin 04/05/17. SW coordinating who will manage coumadin as outpt. INR therapeutic, still on IV heparin. 2. ESRD: Continue TTS schedule, next 8/9 (today). AVF in use again, [facility] has been removed. 3. Anemia:Hgb 10.1. Continue Aranesp 40 mcg weekly. Monitor. 4. Secondary hyperparathyroidism:Labs controlled.Continue binders, sensipar. VDRA had been on hold, now resumedHectoral 2 mcg IV TIW.  5. HTN/volume: BP fine, EDW raised to 90.5kg.Cont amlodipine and losartan.  6. Nutrition: Albumin 3.4. Continue renal diet and Nepro. 7. AV Access:Has aneurysmal RUE AVF which was found to  be thrombosed 2 weeks PTA. Underwent failed thrombectomy (large burden of retained thrombus) at [facility] and R TDC placed at the time. On admit here, noted that the R AVF appeared patent with active thrill/bruit. Fistulogram patent with mild irregularities byIR 04/04/17, able to be used successfully 8/3. TDC removed 8/5. 8. DM:On SSI, per primary. 9. Leukocytosis: WBC now improving. Remains afebrile. No signs/symptoms of active infection. No cultures ordered today. 10. Homelessness: SW aware, has been staying at [facility].  11. Dispo: Fine from renal standpoint. Possibly d/c today after HD.  Veneta Penton, PA-C 04/11/2017, 11:56 AM  [facility] Pager: [phone]  Pt seen, examined and agree w A/P as above.  Kelly Splinter MD [employer] pager [phone]   04/11/2017, 12:30 PM

## 2017-04-11 NOTE — Care Management Note (Addendum)
Case Management Note  Patient Details  Name: [name] MRN: [id number] Date of Birth: Oct 04, 1960  Subjective/Objective:       CM following for progression and d/c planning.              Action/Plan: 04/11/17 Met with pt re followup care and INR blood draws. Per pt he dialyizes at [facility] center early am shift and travel to HD and to all of his appointments by SCAT.  He is willing to go to [facility] if we can get an earlier appointment . He will continue to use the pharmacy at [facility]. Spoke with [name] , [employer] of [facility], who states that they will draw the INR, and report the results to the [facility], there are no stat results. The results are not available until the next day, however is the results warrant they will call [facility] as well as faxing the information. [name] with [facility] made aware of this and await info re phone number and fax to use and will provide this info to [place] at [facility].  11:50 [facility] is unable to provide an earlier appointment, therefore the pt will be given the appointment at [facility] for Friday Sept. 21, 2018.   12:20pm Received call from [name] of [facility] stating that the [facility] provider is not comfortable with a call or fax of INR information and wishes for the pt INR to be drawn at [facility]. The pt was informed and he would like to have this done on Monday, April 15, 2017, he understands that he may go to [facility] at any time during regular hours of operation for this lab work and INR results in order for Coumadin to be adjusted.  This CM contacted [place] [facility], director [name] to inform her of change in plan for lab work and she will remove INR lab draw from pt labs to be drawn.  Dr Algis Liming notified of change  Message left for [name] of [facility] re pt wishes for INR to be drawn on Monday, Aug 13,2018.   Expected Discharge Date:      04/11/2018            Expected Discharge Plan:  Home/Self Care  In-House  Referral:  Clinical Social Work  Discharge planning Services  CM Consult, [facility]  Post Acute Care Choice:  NA Choice offered to:  NA  DME Arranged:  N/A DME Agency:  NA  HH Arranged:  (S) NA HH Agency:  [facility]  Status of Service:  Completed, signed off  If discussed at [employer] [facility] [employer], dates discussed:    Additional Comments:  Adron Bene, RN 04/11/2017, 11:35 AM

## 2017-04-11 NOTE — Progress Notes (Signed)
ANTICOAGULATION CONSULT NOTE - Follow Up Consult  Pharmacy Consult for Heparin + Warfarin  Indication: B/L pulmonary emboli + RLE DVT  Allergies  Allergen Reactions  . No Known Allergies     Patient Measurements: Height: 5\' 11"  (180.3 cm) Weight: 200 lb (90.7 kg) IBW/kg (Calculated) : 75.3 kg  Vital Signs: Temp: 98.1 F (36.7 C) (08/09 0433) Temp Source: Oral (08/09 0433) BP: 132/96 (08/09 0433) Pulse Rate: 86 (08/09 0433)  Labs:  Recent Labs  04/09/17 0441 04/09/17 0727 04/10/17 0430 04/11/17 0340  HGB 10.0*  --  10.5* 10.1*  HCT 30.5*  --  32.1* 30.3*  PLT 361  --  413* 399  LABPROT 21.8*  --  24.9* 27.2*  INR 1.87  --  2.22 2.47  HEPARINUNFRC 0.64  --  0.60 0.52  CREATININE  --  9.65*  --   --     Estimated Creatinine Clearance: 10 mL/min (A) (by C-G formula based on SCr of 9.65 mg/dL (H)).  Medications: Heparin @ 1300 units13/hr (13 ml/hr)  Assessment: [name] on heparin for new bilateral PE and RLE DVT, on heparin bridge to coumadin.  Heparin level therapeutic INR therapeutic x 2  Goal of Therapy:  Heparin level 0.3-0.7 INR 2-3 Monitor platelets by anticoagulation protocol: Yes   Plan:  Continue heparin at 1300 units / hr -> discontinue? Warfarin 7.5 mg po daily at 1800 pm - > dose for discharge Follow up INR Monday if discharged  Daily labs in AM   Thank you Anette Guarneri, PharmD [phone]   04/11/2017 8:39 AM

## 2017-04-11 NOTE — Progress Notes (Signed)
HD tx shortened to 3.5 hours per Nephrology.  Will continue to monitor.

## 2017-04-11 NOTE — Progress Notes (Signed)
Dialysis treatment completed.  3900 mL ultrafiltrated and net fluid removal 3400 mL.  Patient at dry weight of 90.5 Kg post treatment.    Patient status unchanged. Lung sounds diminished to ausculation in all fields. No edema. Cardiac: ST.  Disconnected lines and removed needles.  Pressure held for 10 minutes and band aid/gauze dressing applied.  Report given to bedside RN, Kami.

## 2017-04-11 NOTE — Progress Notes (Signed)
CSW consulted for patient homelessness. CSW met with patient at bedside; patient alert and oriented and with pleasant demeanor. Patient indicated he has been staying at [facility] for the past month. Patient's ex-fiance lives in town but they recently broke up and so he is unable to stay with her. CSW provided patient with additional shelter resources and information for [employer], as well as free meals in the area. Patient was appreciative of resources. CSW signing off.  Estanislado Emms, [place]

## 2017-04-11 NOTE — Telephone Encounter (Signed)
Message received from [facility], RN CM and call returned.    The patient will be having an INR check at [facility] on 04/15/17 with [name], [facility] who is aware that he will come to the clinic whenever he is able to get transportation to the clinic. There is not a scheduled appointment.   As per C. Royal, the patient has been taking SCAT to [place] [facility] and his medical appointments. He has the appointment with [name] at [facility] on 05/24/17 but can check availability for earlier appointments when he is at the clinic on 04/15/17.

## 2017-04-11 NOTE — Progress Notes (Signed)
Patient arrived to unit per bed.  Reviewed treatment plan and this RN agrees.  Report received from bedside RN, Anisha.  Consent verified.  Patient A & O X 4. Lung sounds diminished to ausculation in all fields. No edema. Cardiac: NSR.  Prepped RUASVF with alcohol and cannulated with two 15 gauge needles.  Pulsation of blood noted.  Flushed access well with saline per protocol.  Connected and secured lines and initiated tx at 1546.  UF goal of 3900 mL and net fluid removal of 3400 mL.  Will continue to monitor.

## 2017-04-11 NOTE — Discharge Summary (Signed)
Physician Discharge Summary  [name] NKN:[id number] DOB: [birth year]  PCP: [name], MD  Admit date: 04/02/2017 Discharge date: 04/11/2017  Recommendations for Outpatient Follow-up:  1. [facility]/PCP on 04/15/17 for labs (INR) and Coumadin dose adjustment as needed. Has follow-up appointment on 05/24/17 at 8:30 AM. 2. Hemodialysis center: Continue regular hemodialysis appointments on Tuesdays, Thursdays and Saturdays. 3. Dr. Skeet Latch, Cardiology   Home Health: None Equipment/Devices: None    Discharge Condition: Improved and stable  CODE STATUS: Full  Diet recommendation: Heart healthy & diabetic diet.  Discharge Diagnoses:  Principal Problem:   Pulmonary embolism (HCC) Active Problems:   Essential hypertension   Non-insulin-dependent diabetes mellitus with renal complications (HCC)   End stage renal disease on dialysis ([place])   ESRD on dialysis ([facility])   Left atrial mass   Brief Summary: 56 year old male with PMH of ESRD on TTS HD, anemia, past substance abuse, chronic diastolic CHF, GERD, HTN, PAD, DM 2 not on medications, presented to the ED on 04/02/17 with pleuritic chest pain and dyspnea. CTA chest confirmed bilateral pulmonary emboli with no strain pattern. He was started on IV heparin drip and admitted for further evaluation and management. He had been seen 2 weeks prior in the outpatient setting by nephrology for failed thrombectomy of right brachiocephalic fistula because of large retained thrombus burden in the aneurysms following which a right IJ tunneled HD catheter was placed and dialysis resumed through that.  Assessment and plan:  1. Acute bilateral pulmonary embolism/right femoral vein DVT/right tunneled dialysis catheter tip thrombus: Suspected to be a provoked pulmonary embolus with recent timeline of attempted right upper arm AV fistula thrombectomy, however patient also had lower extremity DVT. Started  on IV heparin bridge and Coumadin per pharmacy. 2-D echo 04/03/17 showed normal LV size, mild LV hypertrophy, LVEF 55% with basal inferior hypokinesis, normal RV size and systolic function. Mass noted in right atrium, likely dialysis catheter tip with adherent thrombus versus vegetation (felt less likely). Case was discussed with cardiology who did not see indication for TEE since patient was already on anticoagulation and low suspicion for endocarditis. Cardiology did formally consult and agreed with need for long-term anticoagulation. Can follow up with cardiology as outpatient regarding need for further evaluation of hypokinesis noted on echo. TDC removed during this admission. Completed appropriate IV heparin bridging. INR has been therapeutic >2 for the last 2 days. Patient will be discharged home on Coumadin. Case management has arranged outpatient follow-up at [place] for lab work and Coumadin dose adjustment as needed. 2. Essential hypertension: Mildly uncontrolled at times. Continue amlodipine and losartan. 3. ESRD on TTS HD: Nephrology consulted. TDC removed due to thrombus at tip. AV fistula in use again. Discussed with nephrology who have cleared him for discharge home after dialysis today. 4. Anemia secondary to chronic kidney disease: Hemoglobin 10.1. Continue [facility] dialysis. Periodic follow-up of CBCs. 5. Type II DM with renal complications: Most recent A1c: 7.1. Not on medications at home. Placed on SSI in the hospital. CBGs not significantly elevated, mild with some fluctuations. Close follow-up of CBGs as outpatient and periodic monitoring of A1c. May consider oral agents as deemed necessary. 6. Chronic diastolic CHF: Compensated. 7. Elevated troponin: Mild. Likely strain pattern in the setting of ESRD on HD and acute PE. No clinical concern for acute ischemia. 8. Metabolic bone disease/secondary hyperparathyroidism: Labs controlled. Continue home  medications/binders. Outpatient follow-up with nephrology. 9. Leukocytosis: WBC is improving.  Remains afebrile. No active signs or symptoms of infection.? Stress margination. Follow-up CBCs as outpatient. 10. Homelessness: Social worker aware and patient has been staying at [facility]. 11. AV access: Patient has aneurysmal RUE aVF which was found to be thrombosed 2 weeks prior to admission. Underwent failed thrombectomy (large burden of retained thrombus) at outpatient dialysis center and right [facility] was placed at that time. On admit here, noted that the right aVF appeared patent with active thrill/bruit. Fistulogram patent with mild irregularities by IR 04/04/17, able to to be used successfully 8/3. TDC removed 8/5. 12. Constipation: Initiated bowel regimen with good effect. Abdominal pain resolved. Continue discharge.   Consultations:  Nephrology  Cardiology  Interventional radiology  Vascular surgery.  Procedures:  Right upper extremity fistulogram 04/04/17  Tunneled dialysis catheter removed at bedside by vascular surgery on 04/07/17.  Hemodialysis  2-D echo: Study Conclusions  - Left ventricle: The cavity size was normal. Wall thickness was   increased in a pattern of mild LVH. The estimated ejection   fraction was 55%. Basal inferior hypokinesis. Doppler parameters   are consistent with abnormal left ventricular relaxation (grade 1   diastolic dysfunction). - Aortic valve: There was no stenosis. - Aorta: Borderline dilated aortic root. Aortic root dimension: 37   mm (ED). - Mitral valve: There was moderate regurgitation. - Left atrium: The atrium was mildly dilated. - Right ventricle: The cavity size was normal. Systolic function   was normal. - Right atrium: There is a mass in the right atrium in the apical 4   chamber view. This is likely the dialysis catheter with adherent   thrombus or vegetation. - Pulmonary arteries: PA peak pressure: 18 mm Hg (S). - Inferior  vena cava: The vessel was normal in size. The   respirophasic diameter changes were in the normal range (>= 50%),   consistent with normal central venous pressure. - Pericardium, extracardiac: A trivial pericardial effusion was   identified.  Impressions:  - Normal LV size with mild LV hypertrophy. EF 55% with basal   inferior hypokinesis. Moderate mitral regurgitation. Normal RV   size and systolic function. Mass noted in right atrium, likely   dialysis catheter tip with adherent thrombus versus vegetation.   Could consider further evaluation with TEE.   Bilateral lower extremity venous Doppler 04/02/17: Summary:  - Findings consistent with acute deep vein thrombosis involving the   right femoral vein. - No evidence of deep vein thrombosis involving the left lower   extremity. - No evidence of Baker&'s cyst on the right or left   Discharge Instructions  Discharge Instructions    Call MD for:  difficulty breathing, headache or visual disturbances    Complete by:  As directed    Call MD for:  extreme fatigue    Complete by:  As directed    Call MD for:  persistant dizziness or light-headedness    Complete by:  As directed    Call MD for:  severe uncontrolled pain    Complete by:  As directed    Diet - low sodium heart healthy    Complete by:  As directed    Diet Carb Modified    Complete by:  As directed    Increase activity slowly    Complete by:  As directed        Medication List    STOP taking these medications   meloxicam 7.5 MG tablet Commonly known as:  MOBIC     TAKE these medications  acetaminophen 325 MG tablet Commonly known as:  TYLENOL Take 2 tablets (650 mg total) by mouth every 6 (six) hours as needed for mild pain or moderate pain (or Fever >/= 101).   amLODipine 5 MG tablet Commonly known as:  NORVASC Take 1 tablet (5 mg total) by mouth at bedtime.   cinacalcet 60 MG tablet Commonly known as:  SENSIPAR Take 60 mg by mouth daily.    lanthanum 1000 MG chewable tablet Commonly known as:  FOSRENOL Chew 1,000 mg by mouth 3 (three) times daily with meals. Reported on 12/21/2015   losartan 25 MG tablet Commonly known as:  COZAAR Take 25 mg by mouth at bedtime. Reported on 12/21/2015   multivitamin Tabs tablet Take 1 tablet by mouth at bedtime.   polyethylene glycol packet Commonly known as:  MIRALAX / GLYCOLAX Take 17 g by mouth daily.   senna 8.6 MG Tabs tablet Commonly known as:  SENOKOT Take 2 tablets (17.2 mg total) by mouth at bedtime as needed for mild constipation or moderate constipation.   warfarin 5 MG tablet Commonly known as:  COUMADIN Take 1.5 tablets (7.5 mg total) by mouth daily at 6 PM.      Follow-up Information    [place] Follow up.   Why:   Lab work to be drawn on Monday, April 15, 2017, for INR, pt may go to [place] anytime on Monday, August 13,2018. Appointment: Friday, May 24, 2017 at 8:30am. Please call if you are unable to keep this appointment.  Contact information: [place] [phone] [facility].   Why:  Continue regular hemodialysis appointments on Tuesdays, Thursdays and Saturdays.       Skeet Latch, MD. Schedule an appointment as soon as possible for a visit.   Specialty:  Cardiology Contact information: [address] [place] [address] [phone]          Allergies  Allergen Reactions  . No Known Allergies       Procedures/Studies: Dg Chest 2 View  Result Date: 04/02/2017 CLINICAL DATA:  56 year old male with centralized chest pain. EXAM: CHEST  2 VIEW COMPARISON:  Chest radiograph dated 02/05/2017 FINDINGS: Right IJ dialysis catheter with tip over right atrium. There is top-normal cardiac size. No vascular congestion or edema. Minimal right lung base atelectatic changes noted. No focal  consolidation, pleural effusion, or pneumothorax. No acute osseous pathology. IMPRESSION: 1. No acute cardiopulmonary process. Minimal right lung base atelectatic changes. 2. Top-normal cardiac size.  No vascular congestion or edema. 3. Minimal right lung base atelectatic changes. No focal consolidation. Electronically Signed   By: Anner Crete M.D.   On: 04/02/2017 02:23   Ct Angio Chest Pe W And/or Wo Contrast  Result Date: 04/02/2017 CLINICAL DATA:  56 year old male with history of diabetes and CHF presenting with chest pain. EXAM: CT ANGIOGRAPHY CHEST WITH CONTRAST TECHNIQUE: Multidetector CT imaging of the chest was performed using the standard protocol during bolus administration of intravenous contrast. Multiplanar CT image reconstructions and MIPs were obtained to evaluate the vascular anatomy. CONTRAST:  100 cc Isovue 370 COMPARISON:  Chest radiograph dated 04/02/2017 chest CT dated 10/01/2016 FINDINGS: Cardiovascular: There is mild cardiomegaly. No pericardial effusion. There is multi vessel coronary vascular calcification involving the left main, lad, RCA, and left circumflex artery. A right IJ dialysis catheter is seen with tip in the right atrium. The thoracic  aorta appears unremarkable for the degree of enhancement. Small partially occlusive bilateral lower lobe segmental pulmonary emboli noted. This finding is new compared to the CT of 10/01/2016. There is no CT evidence of right heart straining Mediastinum/Nodes: No hilar or mediastinal adenopathy. The esophagus and the thyroid gland are grossly unremarkable. Lungs/Pleura: The lungs are clear. There is no pleural effusion or pneumothorax. The central airways are patent. Upper Abdomen: No acute abnormality. Musculoskeletal: Mild degenerative changes of the spine. No acute osseous pathology. Review of the MIP images confirms the above findings. IMPRESSION: 1. Small partially occlusive bilateral lower lobe pulmonary artery emboli. No CT evidence  of right heart straining. 2. Mild cardiomegaly. Advanced multi vessel coronary vascular calcification. Critical Value/emergent results were called by telephone at the time of interpretation on 04/02/2017 at 3:43 am to Dr. Randal Buba, who verbally acknowledged these results. Electronically Signed   By: Anner Crete M.D.   On: 04/02/2017 03:45   Ir Dialy Shunt Intro Needle/intracath Initial W/img Right  Result Date: 04/04/2017 INDICATION: Evaluate for venous stenosis in the right upper extremity. Patient has a right upper arm fistula. EXAM: RIGHT UPPER EXTREMITY FISTULOGRAM MEDICATIONS: None. ANESTHESIA/SEDATION: None FLUOROSCOPY TIME:  Fluoroscopy Time: 32 seconds, 15 mGy CONTRAST:  32 ml [id number] COMPLICATIONS: None immediate. PROCEDURE: The right upper extremity fistula was accessed with an Angiocath. Series of fistulogram images were obtained. Catheter was removed with manual compression. FINDINGS: The fistula was accessed just above the elbow. The draining vein is mild irregularity and mildly aneurysmal near the access site. However, there is no significant stenosis. Central veins are patent. Arterial anastomosis is patent. Right jugular dialysis catheter extending into the right atrium. IMPRESSION: Right upper arm fistula is patent without significant stenosis. 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 ACCESS: This access remains amenable to future percutaneous interventions as clinically indicated. Electronically Signed   By: Markus Daft M.D.   On: 04/04/2017 16:26      Subjective: Seen this morning. Denies chest pain, dyspnea, dizziness or palpitations. Had BM last night without rectal pain. Abdominal pain resolved. No further nausea. Tolerating diet.  Discharge Exam:  Vitals:   04/10/17 1710 04/10/17 2017 04/11/17 0433 04/11/17 0949  BP: 119/70 137/85 (!) 132/96 (!) 145/84  Pulse: 80 94 86 86  Resp: 18 18 18 18   Temp: 98.5 F (36.9 C) 98.3 F (36.8 C) 98.1 F  (36.7 C) 98.7 F (37.1 C)  TempSrc: Oral Oral Oral Oral  SpO2: 99% 98% 98% 98%  Weight:  90.7 kg (200 lb)    Height:        Gen. exam: Pleasant young male lying comfortably supine in bed. Respiratory system: Clear to auscultation. No increased work of breathing. Cardiovascular system: S1 and S2 heard, RRR. No JVD, murmurs or pedal edema. Abdomen: Nondistended, soft and nontender. Normal bowel sounds heard. CNS: Alert and oriented. No focal neurological deficits. Extremities: No asymmetric swelling.    The results of significant diagnostics from this hospitalization (including imaging, microbiology, ancillary and laboratory) are listed below for reference.     Microbiology: Recent Results (from the past 240 hour(s))  MRSA PCR Screening     Status: None   Collection Time: 04/02/17  9:02 PM  Result Value Ref Range Status   MRSA by PCR NEGATIVE NEGATIVE Final    Comment:        The GeneXpert MRSA Assay (FDA approved for NASAL specimens only), is one component of a comprehensive MRSA colonization surveillance program. It  is not intended to diagnose MRSA infection nor to guide or monitor treatment for MRSA infections.      Labs: CBC:  Recent Labs Lab 04/07/17 0528 04/08/17 0532 04/09/17 0441 04/10/17 0430 04/11/17 0340  WBC 12.5* 13.8* 15.1* 17.3* 13.8*  HGB 10.2* 9.8* 10.0* 10.5* 10.1*  HCT 31.3* 30.2* 30.5* 32.1* 30.3*  MCV 95.7 97.4 96.8 97.3 97.1  PLT 301 332 361 413* 542   Basic Metabolic Panel:  Recent Labs Lab 04/06/17 0831 04/09/17 0727  NA 129* 134*  K 4.3 4.5  CL 93* 98*  CO2 24 23  GLUCOSE 124* 137*  BUN 44* 55*  CREATININE 8.37* 9.65*  CALCIUM 8.3* 8.4*  PHOS 4.6 4.0   Liver Function Tests:  Recent Labs Lab 04/06/17 0831 04/09/17 0727  ALBUMIN 3.5 3.4*   BNP (last 3 results)  Recent Labs  09/29/16 0820 04/02/17 0200  BNP 503.1* 324.6*   CBG:  Recent Labs Lab 04/10/17 1145 04/10/17 1708 04/10/17 2015 04/11/17 0736  04/11/17 1143  GLUCAP 169* 112* 180* 108* 140*       Time coordinating discharge: Over 30 minutes  SIGNED:  Vernell Leep, MD, FACP, FHM. [facility] Pager [phone]  If 7PM-7AM, please contact night-coverage [email or web address] Password [email or web address] 04/11/2017, 12:43 PM

## 2017-04-11 NOTE — Care Management Important Message (Signed)
Important Message  Patient Details  Name: [name] MRN: [id number] Date of Birth: 19-May-1961   Medicare Important Message Given:  Yes    [name] 04/11/2017, 8:43 AM

## 2017-04-11 NOTE — Telephone Encounter (Signed)
Call received from [facility], RN CM noting that the patient will be attending HD T/TH/Sat and will need to have his INR checked and coumadin managed.  She stated that HD inquired if [facility] could run the INR but [facility] lab is not able to do that.  She will check with HD to determine if HD can draw the INR and [name], RPH/[facility] stated that she will manage the coumadin after receiving INR results.  Informed [name] that we need to have a way to easily contact the patient to inform him of any changes that need to be made with his coumadin. At this time it is not confirmed if he will be living in a shelter or with his girlfriend.   He also has the option of coming to [facility] to meet with [name], RPH to have his INR done/coumadin management. He has medicaid and can register for medicaid transportation if needed. She noted that he wantede to make his own follow up appointment. A per [facility], he has an appointment for 05/24/17 with [name] @ [facility] but has the option of scheduling an appointment at [place] that can be arranged sooner than 05/24/17.

## 2017-04-13 DIAGNOSIS — E8770 Fluid overload, unspecified: Secondary | ICD-10-CM | POA: Diagnosis not present

## 2017-04-13 DIAGNOSIS — D631 Anemia in chronic kidney disease: Secondary | ICD-10-CM | POA: Diagnosis not present

## 2017-04-13 DIAGNOSIS — N186 End stage renal disease: Secondary | ICD-10-CM | POA: Diagnosis not present

## 2017-04-13 DIAGNOSIS — N2581 Secondary hyperparathyroidism of renal origin: Secondary | ICD-10-CM | POA: Diagnosis not present

## 2017-04-13 DIAGNOSIS — D509 Iron deficiency anemia, unspecified: Secondary | ICD-10-CM | POA: Diagnosis not present

## 2017-04-13 DIAGNOSIS — E1029 Type 1 diabetes mellitus with other diabetic kidney complication: Secondary | ICD-10-CM | POA: Diagnosis not present

## 2017-04-15 ENCOUNTER — Ambulatory Visit (INDEPENDENT_AMBULATORY_CARE_PROVIDER_SITE_OTHER): Payer: Self-pay | Admitting: Surgery

## 2017-04-15 ENCOUNTER — Ambulatory Visit (INDEPENDENT_AMBULATORY_CARE_PROVIDER_SITE_OTHER)
Admission: RE | Admit: 2017-04-15 | Discharge: 2017-04-15 | Disposition: A | Discharge status: Home / Self Care | Payer: Medicare Other | Source: Ambulatory Visit | Attending: Surgery | Admitting: Surgery

## 2017-04-15 ENCOUNTER — Ambulatory Visit (HOSPITAL_COMMUNITY)
Admission: RE | Admit: 2017-04-15 | Discharge: 2017-04-15 | Disposition: A | Discharge status: Home / Self Care | Payer: Medicare Other | Source: Ambulatory Visit | Attending: Surgery | Admitting: Surgery

## 2017-04-15 ENCOUNTER — Encounter: Payer: Self-pay | Admitting: Family Medicine

## 2017-04-15 ENCOUNTER — Ambulatory Visit: Payer: Medicare Other | Attending: Family Medicine | Admitting: Family Medicine

## 2017-04-15 ENCOUNTER — Encounter: Payer: Self-pay | Admitting: Surgery

## 2017-04-15 ENCOUNTER — Ambulatory Visit: Payer: Medicare Other | Admitting: Pharmacist

## 2017-04-15 VITALS — BP 143/78 | HR 92 | Temp 97.7°F | Ht 71.0 in | Wt 208.2 lb

## 2017-04-15 VITALS — BP 150/88 | HR 97 | Temp 97.0°F | Resp 16 | Ht 70.5 in | Wt 209.7 lb

## 2017-04-15 DIAGNOSIS — Z992 Dependence on renal dialysis: Secondary | ICD-10-CM | POA: Insufficient documentation

## 2017-04-15 DIAGNOSIS — E1151 Type 2 diabetes mellitus with diabetic peripheral angiopathy without gangrene: Secondary | ICD-10-CM | POA: Diagnosis not present

## 2017-04-15 DIAGNOSIS — I82411 Acute embolism and thrombosis of right femoral vein: Secondary | ICD-10-CM | POA: Diagnosis not present

## 2017-04-15 DIAGNOSIS — I132 Hypertensive heart and chronic kidney disease with heart failure and with stage 5 chronic kidney disease, or end stage renal disease: Secondary | ICD-10-CM | POA: Diagnosis not present

## 2017-04-15 DIAGNOSIS — Z7901 Long term (current) use of anticoagulants: Secondary | ICD-10-CM | POA: Insufficient documentation

## 2017-04-15 DIAGNOSIS — N186 End stage renal disease: Secondary | ICD-10-CM

## 2017-04-15 DIAGNOSIS — E1121 Type 2 diabetes mellitus with diabetic nephropathy: Secondary | ICD-10-CM | POA: Insufficient documentation

## 2017-04-15 DIAGNOSIS — I208 Other forms of angina pectoris: Secondary | ICD-10-CM

## 2017-04-15 DIAGNOSIS — I2699 Other pulmonary embolism without acute cor pulmonale: Secondary | ICD-10-CM

## 2017-04-15 DIAGNOSIS — Z01818 Encounter for other preprocedural examination: Secondary | ICD-10-CM | POA: Diagnosis not present

## 2017-04-15 DIAGNOSIS — I5032 Chronic diastolic (congestive) heart failure: Secondary | ICD-10-CM | POA: Diagnosis not present

## 2017-04-15 DIAGNOSIS — I10 Essential (primary) hypertension: Secondary | ICD-10-CM | POA: Diagnosis not present

## 2017-04-15 DIAGNOSIS — K219 Gastro-esophageal reflux disease without esophagitis: Secondary | ICD-10-CM | POA: Diagnosis not present

## 2017-04-15 DIAGNOSIS — E1122 Type 2 diabetes mellitus with diabetic chronic kidney disease: Secondary | ICD-10-CM | POA: Insufficient documentation

## 2017-04-15 DIAGNOSIS — I82401 Acute embolism and thrombosis of unspecified deep veins of right lower extremity: Secondary | ICD-10-CM | POA: Insufficient documentation

## 2017-04-15 DIAGNOSIS — Z79899 Other long term (current) drug therapy: Secondary | ICD-10-CM | POA: Diagnosis not present

## 2017-04-15 LAB — GLUCOSE, POCT (MANUAL RESULT ENTRY): POC GLUCOSE: 220 mg/dL — AB (ref 70–99)

## 2017-04-15 MED ORDER — GLIPIZIDE 5 MG PO TABS: 5.0000 mg | ORAL_TABLET | Freq: Two times a day (BID) | ORAL | 3 refills | Status: DC

## 2017-04-15 MED FILL — glipiZIDE 5 MG TABS: 5 | 30 days supply | Qty: 60 | Fill #0

## 2017-04-15 NOTE — Patient Instructions (Signed)
Pulmonary Embolism °A pulmonary embolism (PE) is a sudden blockage or decrease of blood flow in one lung or both lungs. Most blockages come from a blood clot that travels from the legs or the pelvis to the lungs. PE is a dangerous and potentially life-threatening condition if it is not treated right away. °What are the causes? °A pulmonary embolism occurs most commonly when a blood clot travels from one of your veins to your lungs. Rarely, PE is caused by air, fat, amniotic fluid, or part of a tumor traveling through your veins to your lungs. °What increases the risk? °A PE is more likely to develop in: °· People who smoke. °· People who are older, especially over 60 years of age. °· People who are overweight (obese). °· People who sit or lie still for a long time, such as during long-distance travel (over 4 hours), bed rest, hospitalization, or during recovery from certain medical conditions like a stroke. °· People who do not engage in much physical activity (sedentary lifestyle). °· People who have chronic breathing disorders. °· People who have a personal or family history of blood clots or blood clotting disease. °· People who have peripheral vascular disease (PVD), diabetes, or some types of cancer. °· People who have heart disease, especially if the person had a recent heart attack or has congestive heart failure. °· People who have neurological diseases that affect the legs (leg paresis). °· People who have had a traumatic injury, such as breaking a hip or leg. °· People who have recently had major or lengthy surgery, especially on the hip, knee, or abdomen. °· People who have had a central line placed inside a large vein. °· People who take medicines that contain the hormone estrogen. These include birth control pills and hormone replacement therapy. °· Pregnancy or during childbirth or the postpartum period. ° °What are the signs or symptoms? °The symptoms of a PE usually start suddenly and  include: °· Shortness of breath while active or at rest. °· Coughing or coughing up blood or blood-tinged mucus. °· Chest pain that is often worse with deep breaths. °· Rapid or irregular heartbeat. °· Feeling light-headed or dizzy. °· Fainting. °· Feeling anxious. °· Sweating. ° °There may also be pain and swelling in a leg if that is where the blood clot started. °These symptoms may represent a serious problem that is an emergency. Do not wait to see if the symptoms will go away. Get medical help right away. Call your local emergency services (911 in the [place] Do not drive yourself to the hospital. °How is this diagnosed? °Your health care provider will take a medical history and perform a physical exam. You may also have other tests, including: °· Blood tests to assess the clotting properties of your blood, assess oxygen levels in your blood, and find blood clots. °· Imaging tests, such as CT, ultrasound, MRI, X-ray, and other tests to see if you have clots anywhere in your body. °· An electrocardiogram (ECG) to look for heart strain from blood clots in the lungs. ° °How is this treated? °The main goals of PE treatment are: °· To stop a blood clot from growing larger. °· To stop new blood clots from forming. ° °The type of treatment that you receive depends on many factors, such as the cause of your PE, your risk for bleeding or developing more clots, and other medical conditions that you have. Sometimes, a combination of treatments is necessary. °This condition may be treated with: °· Medicines, including newer oral blood thinners (  anticoagulants), warfarin, low molecular weight heparins, thrombolytics, or heparins. °· Wearing compression stockings or using different types of devices. °· Surgery (rare) to remove the blood clot or to place a filter in your abdomen to stop the blood clot from traveling to your lungs. ° °Treatments for a PE are often divided into immediate treatment, long-term treatment (up to 3  months after PE), and extended treatment (more than 3 months after PE). Your treatment may continue for several months. This is called maintenance therapy, and it is used to prevent the forming of new blood clots. You can work with your health care provider to choose the treatment program that is best for you. °What are anticoagulants? °Anticoagulants are medicines that treat PEs. They can stop current blood clots from growing and stop new clots from forming. They cannot dissolve existing clots. Your body dissolves clots by itself over time. Anticoagulants are given by mouth, by injection, or through an IV tube. °What are thrombolytics? °Thrombolytics are clot-dissolving medicines that are used to dissolve a PE. They carry a high risk of bleeding, so they tend to be used only in severe cases or if you have very low blood pressure. °Follow these instructions at home: °If you are taking a newer oral anticoagulant: °· Take the medicine every single day at the same time each day. °· Understand what foods and drugs interact with this medicine. °· Understand that there are no regular blood tests required when using this medicine. °· Understand the side effects of this medicine, including excessive bruising or bleeding. Ask your health care provider or pharmacist about other possible side effects. °If you are taking warfarin: °· Understand how to take warfarin and know which foods can affect how warfarin works in your body. °· Understand that it is dangerous to take too much or too little warfarin. Too much warfarin increases the risk of bleeding. Too little warfarin continues to allow the risk for blood clots. °· Follow your PT and INR blood testing schedule. The PT and INR results allow your health care provider to adjust your dose of warfarin. It is very important that you have your PT and INR tested as often as told by your health care provider. °· Avoid major changes in your diet, or tell your health care provider  before you change your diet. Arrange a visit with a registered dietitian to answer your questions. Many foods, especially foods that are high in vitamin K, can interfere with warfarin and affect the PT and INR results. Eat a consistent amount of foods that are high in vitamin K, such as: °? Spinach, kale, broccoli, cabbage, collard greens, turnip greens, Brussels sprouts, peas, cauliflower, seaweed, and parsley. °? Beef liver and pork liver. °? Green tea. °? Soybean oil. °· Tell your health care provider about any and all medicines, vitamins, and supplements that you take, including aspirin and other over-the-counter anti-inflammatory medicines. Be especially cautious with aspirin and anti-inflammatory medicines. Do not take those before you ask your health care provider if it is safe to do so. This is important because many medicines can interfere with warfarin and affect the PT and INR results. °· Do not start or stop taking any over-the-counter or prescription medicine unless your health care provider or pharmacist tells you to do so. °If you take warfarin, you will also need to do these things: °· Hold pressure over cuts for longer than usual. °· Tell your dentist and other health care providers that you are taking warfarin before you have   any procedures in which bleeding may occur. °· Avoid alcohol or drink very small amounts. Tell your health care provider if you change your alcohol intake. °· Do not use tobacco products, including cigarettes, chewing tobacco, and e-cigarettes. If you need help quitting, ask your health care provider. °· Avoid contact sports. ° °General instructions °· Take over-the-counter and prescription medicines only as told by your health care provider. Anticoagulant medicines can have side effects, including easy bruising and difficulty stopping bleeding. If you are prescribed an anticoagulant, you will also need to do these things: °? Hold pressure over cuts for longer than  usual. °? Tell your dentist and other health care providers that you are taking anticoagulants before you have any procedures in which bleeding may occur. °? Avoid contact sports. °· Wear a medical alert bracelet or carry a medical alert card that says you have had a PE. °· Ask your health care provider how soon you can go back to your normal activities. Stay active to prevent new blood clots from forming. °· Make sure to exercise while traveling or when you have been sitting or standing for a long period of time. It is very important to exercise. Exercise your legs by walking or by tightening and relaxing your leg muscles often. Take frequent walks. °· Wear compression stockings as told by your health care provider to help prevent more blood clots from forming. °· Do not use tobacco products, including cigarettes, chewing tobacco, and e-cigarettes. If you need help quitting, ask your health care provider. °· Keep all follow-up appointments with your health care provider. This is important. °How is this prevented? °Take these actions to decrease your risk of developing another PE: °· Exercise regularly. For at least 30 minutes every day, engage in: °? Activity that involves moving your arms and legs. °? Activity that encourages good blood flow through your body by increasing your heart rate. °· Exercise your arms and legs every hour during long-distance travel (over 4 hours). Drink plenty of water and avoid drinking alcohol while traveling. °· Avoid sitting or lying in bed for long periods of time without moving your legs. °· Maintain a weight that is appropriate for your height. Ask your health care provider what weight is healthy for you. °· If you are a woman who is over 35 years of age, avoid unnecessary use of medicines that contain estrogen. These include birth control pills. °· Do not smoke, especially if you take estrogen medicines. If you need help quitting, ask your health care provider. °· If you are at  very high risk for PE, wear compression stockings. °· If you recently had a PE, have regularly scheduled ultrasound testing on your legs to check for new blood clots. ° °If you are hospitalized, prevention measures may include: °· Early walking after surgery, as soon as your health care provider says that it is safe. °· Receiving anticoagulants to prevent blood clots. If you cannot take anticoagulants, other options may be available, such as wearing compression stockings or using different types of devices. ° °Get help right away if: °· You have new or increased pain, swelling, or redness in an arm or leg. °· You have numbness or tingling in an arm or leg. °· You have shortness of breath while active or at rest. °· You have chest pain. °· You have a rapid or irregular heartbeat. °· You feel light-headed or dizzy. °· You cough up blood. °· You notice blood in your vomit, bowel movement, or   urine. °· You have a fever. °These symptoms may represent a serious problem that is an emergency. Do not wait to see if the symptoms will go away. Get medical help right away. Call your local emergency services (911 in the [place] Do not drive yourself to the hospital. °This information is not intended to replace advice given to you by your health care provider. Make sure you discuss any questions you have with your health care provider. °Document Released: 08/17/2000 Document Revised: 01/26/2016 Document Reviewed: 12/15/2014 °Elsevier Interactive Patient Education © 2017 [employer] ° °
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
## 2017-04-16 DIAGNOSIS — N186 End stage renal disease: Secondary | ICD-10-CM | POA: Diagnosis not present

## 2017-04-16 DIAGNOSIS — N2581 Secondary hyperparathyroidism of renal origin: Secondary | ICD-10-CM | POA: Diagnosis not present

## 2017-04-16 DIAGNOSIS — E1029 Type 1 diabetes mellitus with other diabetic kidney complication: Secondary | ICD-10-CM | POA: Diagnosis not present

## 2017-04-16 DIAGNOSIS — D631 Anemia in chronic kidney disease: Secondary | ICD-10-CM | POA: Diagnosis not present

## 2017-04-16 DIAGNOSIS — D509 Iron deficiency anemia, unspecified: Secondary | ICD-10-CM | POA: Diagnosis not present

## 2017-04-16 DIAGNOSIS — E8770 Fluid overload, unspecified: Secondary | ICD-10-CM | POA: Diagnosis not present

## 2017-04-17 ENCOUNTER — Ambulatory Visit (INDEPENDENT_AMBULATORY_CARE_PROVIDER_SITE_OTHER): Payer: Medicare Other | Admitting: Podiatry

## 2017-04-17 ENCOUNTER — Encounter: Payer: Self-pay | Admitting: Podiatry

## 2017-04-17 VITALS — BP 119/57 | HR 68 | Temp 98.1°F | Resp 18

## 2017-04-17 DIAGNOSIS — B351 Tinea unguium: Secondary | ICD-10-CM | POA: Diagnosis not present

## 2017-04-17 DIAGNOSIS — E1151 Type 2 diabetes mellitus with diabetic peripheral angiopathy without gangrene: Secondary | ICD-10-CM

## 2017-04-17 DIAGNOSIS — E1142 Type 2 diabetes mellitus with diabetic polyneuropathy: Secondary | ICD-10-CM | POA: Diagnosis not present

## 2017-04-17 NOTE — Progress Notes (Signed)
   Subjective:    Patient ID: [name], male    DOB: 1960-12-08, 56 y.o.   MRN: [id number]  HPI    Review of Systems  Musculoskeletal:       My 4th toe on my right foot is still hurting and not as dark as before  All other systems reviewed and are negative.      Objective:   Physical Exam        Assessment & Plan:

## 2017-04-17 NOTE — Progress Notes (Signed)
Patient ID: [name], male   DOB: [birth year], 56 y.o.   MRN: [id number]    Subjective: This patient presents complaining of elongated toenails when walking and wearing shoes and request toenail debridement. Patient is history of fracture proximal phalanx fourth right digit diagnosed in 03/11/2017 and currently wearing surgical shoe. He states the fourth right toe still somewhat uncomfortable, however, improving over time  Objective:  patient appears pleasant orientated 3  Vascular: No peripheral edema noted bilaterally DP and PT pulses 0/4 bilaterally Capillary reflex within normal limits bilaterally  Neurological: Sensation to 10 g monofilament wire intact 4/5 bilaterally Vibratory sensation nonreactive right reactive left Ankle reflex were equal and reactive bilaterally  Dermatological: No open skin lesions bilaterally Dry atrophic skin bilaterally without hair growth The toenails are elongated, hypertrophic, brittle with texture and color changes 6-10  Musculoskeletal: Amputation distal left hallux with well-healed incision Rigid hammertoe second left without any signs of irritation on the second left toe There is no restriction ankle, subtalar, midtarsal joints Myoedema fourth right toe without any open lesions and tenderness to direct palpation at PIPJ  Assessment: Mycotic toenails 9 Diabetic peripheral arterial disease Diabetic peripheral neuropathy Hammertoe second left Reducing symptoms of fracture fourth right toe  Plan: Debridement toenails 6-10 mechanically and electrically without any bleeding Patient instructed wear surgical shoe on right foot until fourth toe was asymptomatic  Reappoint 3 months

## 2017-04-17 NOTE — Patient Instructions (Signed)

## 2017-04-18 ENCOUNTER — Other Ambulatory Visit: Payer: Self-pay

## 2017-04-18 DIAGNOSIS — I5032 Chronic diastolic (congestive) heart failure: Secondary | ICD-10-CM

## 2017-04-18 DIAGNOSIS — D631 Anemia in chronic kidney disease: Secondary | ICD-10-CM | POA: Diagnosis not present

## 2017-04-18 DIAGNOSIS — E8770 Fluid overload, unspecified: Secondary | ICD-10-CM | POA: Diagnosis not present

## 2017-04-18 DIAGNOSIS — N186 End stage renal disease: Secondary | ICD-10-CM | POA: Diagnosis not present

## 2017-04-18 DIAGNOSIS — E1029 Type 1 diabetes mellitus with other diabetic kidney complication: Secondary | ICD-10-CM | POA: Diagnosis not present

## 2017-04-18 DIAGNOSIS — N2581 Secondary hyperparathyroidism of renal origin: Secondary | ICD-10-CM | POA: Diagnosis not present

## 2017-04-18 DIAGNOSIS — D509 Iron deficiency anemia, unspecified: Secondary | ICD-10-CM | POA: Diagnosis not present

## 2017-04-20 DIAGNOSIS — N186 End stage renal disease: Secondary | ICD-10-CM | POA: Diagnosis not present

## 2017-04-20 DIAGNOSIS — D631 Anemia in chronic kidney disease: Secondary | ICD-10-CM | POA: Diagnosis not present

## 2017-04-20 DIAGNOSIS — D509 Iron deficiency anemia, unspecified: Secondary | ICD-10-CM | POA: Diagnosis not present

## 2017-04-20 DIAGNOSIS — N2581 Secondary hyperparathyroidism of renal origin: Secondary | ICD-10-CM | POA: Diagnosis not present

## 2017-04-20 DIAGNOSIS — E1029 Type 1 diabetes mellitus with other diabetic kidney complication: Secondary | ICD-10-CM | POA: Diagnosis not present

## 2017-04-20 DIAGNOSIS — E8770 Fluid overload, unspecified: Secondary | ICD-10-CM | POA: Diagnosis not present

## 2017-04-22 ENCOUNTER — Other Ambulatory Visit: Payer: Self-pay | Admitting: *Deleted

## 2017-04-22 ENCOUNTER — Other Ambulatory Visit: Payer: Self-pay

## 2017-04-22 ENCOUNTER — Other Ambulatory Visit: Payer: Self-pay | Admitting: Family Medicine

## 2017-04-22 ENCOUNTER — Encounter: Payer: Self-pay | Admitting: *Deleted

## 2017-04-22 ENCOUNTER — Ambulatory Visit: Payer: Self-pay

## 2017-04-22 DIAGNOSIS — I10 Essential (primary) hypertension: Secondary | ICD-10-CM

## 2017-04-22 NOTE — Patient Outreach (Signed)
[facility]) Care Management  6/81/2751  [name] [birth year] [id number]   CSW made an initial attempt to try and contact patient today to perform phone assessment, as well as assess and assist with social needs and services, without success.  A HIPAA compliant message was left for patient on voicemail.  CSW is currently awaiting a return call. CSW will make a second outreach attempt within the next week, if CSW does not receive a return call from patient in the meantime. [name], BSW, MSW, LCSW  Licensed Education officer, environmental Health System  Mailing [place] [address] [place], [place] [address] Physical Address-[address], [place], [place] [address] Toll Free Main # [phone] Fax # [phone] Cell # [phone]  Office # [phone] [email or web address]

## 2017-04-22 NOTE — Patient Outreach (Signed)
[facility]) Care Management  69/45/03  [name] [birth year] [id number]  RNCM received referral on 04/22/2017 for transition of care follow up for patient. Patient had a recent hospital admission 7/31-04/11/2017 for bilateral pulmonary emboli. He is to follow up with [facility]ness for INR monitoring and coumadin adjustments. He has a follow up appointment on Friday, 04/26/2017 at 9:30 am with Dr. Jarold Song. He also needs follow up with Dr. Skeet Latch (cardiology).  Patient has a past medical history of ESRD on hemodialysis (Tuesdays, Thursdays and Saturdays), anemia, past substance abuse, chronic diastolic CHF, GERD, HTN, PAD, DM (not on medications).   Initial attempt to reach patient for transition of care without success. Left HIPAA compliant voicemail with RNCM contact information and invited callback. RNCM will continue to attempt to reach patient for TOC per [facility] workflow.  [place] R. Yenifer Saccente, RN, BSN, [place] Management Coordinator [phone]

## 2017-04-23 ENCOUNTER — Other Ambulatory Visit: Payer: Self-pay

## 2017-04-23 ENCOUNTER — Other Ambulatory Visit: Payer: Self-pay | Admitting: *Deleted

## 2017-04-23 DIAGNOSIS — E1029 Type 1 diabetes mellitus with other diabetic kidney complication: Secondary | ICD-10-CM | POA: Diagnosis not present

## 2017-04-23 DIAGNOSIS — N2581 Secondary hyperparathyroidism of renal origin: Secondary | ICD-10-CM | POA: Diagnosis not present

## 2017-04-23 DIAGNOSIS — N186 End stage renal disease: Secondary | ICD-10-CM | POA: Diagnosis not present

## 2017-04-23 DIAGNOSIS — E8770 Fluid overload, unspecified: Secondary | ICD-10-CM | POA: Diagnosis not present

## 2017-04-23 DIAGNOSIS — D509 Iron deficiency anemia, unspecified: Secondary | ICD-10-CM | POA: Diagnosis not present

## 2017-04-23 DIAGNOSIS — D631 Anemia in chronic kidney disease: Secondary | ICD-10-CM | POA: Diagnosis not present

## 2017-04-23 NOTE — Patient Outreach (Signed)
[facility]) Care Management  [birth year]  DESTRY DAUBER [birth year] [id number]  Second attempt to reach patient for transition of care unsuccessful. Left HIPAA compliant voicemail with RNCM contact information and invited patient to callback.  [place] R. Janyiah Silveri, RN, BSN, [place] Management Coordinator [phone]

## 2017-04-23 NOTE — Patient Outreach (Signed)
[facility]) Care Management  7/79/3903  ELMOND POEHLMAN [birth year]9 [id number]   CSW made a second attempt to try and contact patient today to perform phone assessment, as well as assess and assist with social needs and services, without success.  A HIPAA compliant message was left for patient on voicemail.  CSW is currently awaiting a return call. CSW will make a third and final outreach attempt within the next week, if CSW does not receive a return call from patient in the meantime. [name], BSW, MSW, LCSW  Licensed Education officer, environmental Health System  Mailing [place] [address], [place], [place] [address] Physical Address-[address], [place], [place] [address] Toll Free Main # [phone] Fax # [phone] Cell # [phone]  Office # [phone] [email or web address]

## 2017-04-24 ENCOUNTER — Other Ambulatory Visit: Payer: Self-pay

## 2017-04-24 DIAGNOSIS — D631 Anemia in chronic kidney disease: Secondary | ICD-10-CM | POA: Diagnosis not present

## 2017-04-24 DIAGNOSIS — E8770 Fluid overload, unspecified: Secondary | ICD-10-CM | POA: Diagnosis not present

## 2017-04-24 DIAGNOSIS — D509 Iron deficiency anemia, unspecified: Secondary | ICD-10-CM | POA: Diagnosis not present

## 2017-04-24 DIAGNOSIS — E1029 Type 1 diabetes mellitus with other diabetic kidney complication: Secondary | ICD-10-CM | POA: Diagnosis not present

## 2017-04-24 DIAGNOSIS — N186 End stage renal disease: Secondary | ICD-10-CM | POA: Diagnosis not present

## 2017-04-24 DIAGNOSIS — N2581 Secondary hyperparathyroidism of renal origin: Secondary | ICD-10-CM | POA: Diagnosis not present

## 2017-04-24 NOTE — Patient Outreach (Signed)
[facility]) Care Management  [birth year]  DARWYN PONZO [birth year] [id number]  Third and final outreach attempt completed without success. Left HIPAA compliant voicemail with RNCM contact information and requested callback.   Plan- RNCM will send outreach barrier letter and wait 10 business days before completing case closure if no return call has been received.  [place] R. Demarco Bacci, RN, BSN, [place] Management Coordinator [phone]

## 2017-04-25 DIAGNOSIS — E1029 Type 1 diabetes mellitus with other diabetic kidney complication: Secondary | ICD-10-CM | POA: Diagnosis not present

## 2017-04-25 DIAGNOSIS — N186 End stage renal disease: Secondary | ICD-10-CM | POA: Diagnosis not present

## 2017-04-25 DIAGNOSIS — D631 Anemia in chronic kidney disease: Secondary | ICD-10-CM | POA: Diagnosis not present

## 2017-04-25 DIAGNOSIS — N2581 Secondary hyperparathyroidism of renal origin: Secondary | ICD-10-CM | POA: Diagnosis not present

## 2017-04-25 DIAGNOSIS — D509 Iron deficiency anemia, unspecified: Secondary | ICD-10-CM | POA: Diagnosis not present

## 2017-04-25 DIAGNOSIS — E8770 Fluid overload, unspecified: Secondary | ICD-10-CM | POA: Diagnosis not present

## 2017-04-26 ENCOUNTER — Encounter: Payer: Self-pay | Admitting: Family Medicine

## 2017-04-26 ENCOUNTER — Ambulatory Visit: Payer: Medicare Other | Attending: Family Medicine | Admitting: Family Medicine

## 2017-04-26 VITALS — BP 108/71 | HR 95 | Temp 98.4°F | Resp 18 | Ht 71.0 in | Wt 205.0 lb

## 2017-04-26 DIAGNOSIS — Z7901 Long term (current) use of anticoagulants: Secondary | ICD-10-CM | POA: Diagnosis not present

## 2017-04-26 DIAGNOSIS — E1122 Type 2 diabetes mellitus with diabetic chronic kidney disease: Secondary | ICD-10-CM | POA: Insufficient documentation

## 2017-04-26 DIAGNOSIS — I208 Other forms of angina pectoris: Secondary | ICD-10-CM | POA: Diagnosis not present

## 2017-04-26 DIAGNOSIS — E1151 Type 2 diabetes mellitus with diabetic peripheral angiopathy without gangrene: Secondary | ICD-10-CM | POA: Diagnosis not present

## 2017-04-26 DIAGNOSIS — Z7984 Long term (current) use of oral hypoglycemic drugs: Secondary | ICD-10-CM | POA: Diagnosis not present

## 2017-04-26 DIAGNOSIS — K219 Gastro-esophageal reflux disease without esophagitis: Secondary | ICD-10-CM | POA: Diagnosis not present

## 2017-04-26 DIAGNOSIS — E1121 Type 2 diabetes mellitus with diabetic nephropathy: Secondary | ICD-10-CM | POA: Insufficient documentation

## 2017-04-26 DIAGNOSIS — I82411 Acute embolism and thrombosis of right femoral vein: Secondary | ICD-10-CM

## 2017-04-26 DIAGNOSIS — Z23 Encounter for immunization: Secondary | ICD-10-CM

## 2017-04-26 DIAGNOSIS — I132 Hypertensive heart and chronic kidney disease with heart failure and with stage 5 chronic kidney disease, or end stage renal disease: Secondary | ICD-10-CM | POA: Insufficient documentation

## 2017-04-26 DIAGNOSIS — N186 End stage renal disease: Secondary | ICD-10-CM | POA: Diagnosis not present

## 2017-04-26 DIAGNOSIS — I2699 Other pulmonary embolism without acute cor pulmonale: Secondary | ICD-10-CM | POA: Insufficient documentation

## 2017-04-26 DIAGNOSIS — Z992 Dependence on renal dialysis: Secondary | ICD-10-CM | POA: Diagnosis not present

## 2017-04-26 DIAGNOSIS — I5032 Chronic diastolic (congestive) heart failure: Secondary | ICD-10-CM | POA: Insufficient documentation

## 2017-04-26 DIAGNOSIS — Z1211 Encounter for screening for malignant neoplasm of colon: Secondary | ICD-10-CM | POA: Diagnosis not present

## 2017-04-26 LAB — GLUCOSE, POCT (MANUAL RESULT ENTRY): POC GLUCOSE: 227 mg/dL — AB (ref 70–99)

## 2017-04-26 LAB — POCT INR: INR: 1.6

## 2017-04-26 MED ORDER — OMEPRAZOLE 20 MG PO CPDR: 20.0000 mg | DELAYED_RELEASE_CAPSULE | Freq: Every day | ORAL | 3 refills | Status: DC

## 2017-04-26 MED ORDER — WARFARIN SODIUM 5 MG PO TABS: ORAL_TABLET | ORAL | 2 refills | Status: DC

## 2017-04-26 MED FILL — OMEPRAZOLE DR 20 MG CAPSULE: 20 | 30 days supply | Qty: 30 | Fill #0

## 2017-04-26 NOTE — Patient Instructions (Signed)
Gastroesophageal Reflux Scan A gastroesophageal reflux scan is a procedure that is used to check for gastroesophageal reflux, which is the backward flow of stomach contents into the tube that carries food from the mouth to the stomach (esophagus). The scan can also show if any stomach contents are inhaled (aspirated) into your lungs. You may need this scan if you have symptoms such as heartburn, vomiting, swallowing problems, or regurgitation. Regurgitation means that swallowed food is returning from the stomach to the esophagus. For this scan, you will drink a liquid that contains a small amount of a radioactive substance (tracer). A scanner with a camera that detects the radioactive tracer is used to see if any of the material backs up into your esophagus. Tell a health care provider about:  Any allergies you have.  All medicines you are taking, including vitamins, herbs, eye drops, creams, and over-the-counter medicines.  Any blood disorders you have.  Any surgeries you have had.  Any medical conditions you have.  If you are pregnant or you think that you may be pregnant.  If you are breastfeeding. What are the risks? Generally, this is a safe procedure. However, problems may occur, including:  Exposure to radiation (a small amount).  Allergic reaction to the radioactive substance. This is rare.  What happens before the procedure?  Ask your health care provider about changing or stopping your regular medicines. This is especially important if you are taking diabetes medicines or blood thinners.  Follow your health care provider's instructions about eating or drinking restrictions. What happens during the procedure?  You will be asked to drink a liquid that contains a small amount of a radioactive tracer. This liquid will probably be similar to orange juice.  You will assume a position lying on your back.  A series of images will be taken of your esophagus and upper  stomach.  You may be asked to move into different positions to help determine if reflux occurs more often when you are in specific positions.  For adults, an abdominal binder with an inflatable cuff may be placed on the belly (abdomen). This may be used to increase abdominal pressure. More images will be taken to see if the increased pressure causes reflux to occur. The procedure may vary among health care providers and hospitals. What happens after the procedure?  Return to your normal activities and your normal diet as directed by your health care provider.  The radioactive tracer will leave your body over the next few days. Drink enough fluid to keep your urine clear or pale yellow. This will help to flush the tracer out of your body.  It is your responsibility to obtain your test results. Ask your health care provider or the department performing the test when and how you will get your results. This information is not intended to replace advice given to you by your health care provider. Make sure you discuss any questions you have with your health care provider. Document Released: 10/11/2005 Document Revised: 05/14/2016 Document Reviewed: 06/01/2014 Elsevier Interactive Patient Education  2018 [employer]  
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
## 2017-04-27 DIAGNOSIS — D631 Anemia in chronic kidney disease: Secondary | ICD-10-CM | POA: Diagnosis not present

## 2017-04-27 DIAGNOSIS — D509 Iron deficiency anemia, unspecified: Secondary | ICD-10-CM | POA: Diagnosis not present

## 2017-04-27 DIAGNOSIS — N2581 Secondary hyperparathyroidism of renal origin: Secondary | ICD-10-CM | POA: Diagnosis not present

## 2017-04-27 DIAGNOSIS — N186 End stage renal disease: Secondary | ICD-10-CM | POA: Diagnosis not present

## 2017-04-27 DIAGNOSIS — E8770 Fluid overload, unspecified: Secondary | ICD-10-CM | POA: Diagnosis not present

## 2017-04-27 DIAGNOSIS — E1029 Type 1 diabetes mellitus with other diabetic kidney complication: Secondary | ICD-10-CM | POA: Diagnosis not present

## 2017-04-30 ENCOUNTER — Other Ambulatory Visit: Payer: Self-pay | Admitting: *Deleted

## 2017-04-30 ENCOUNTER — Encounter: Payer: Self-pay | Admitting: *Deleted

## 2017-04-30 NOTE — Patient Outreach (Signed)
[facility]) Care Management  1/60/7371  JUWAUN INSKEEP [birth year] [id number]   CSW made a third and final attempt to try and contact patient today to perform phone assessment, as well as assess and assist with social work needs and services, without success.  A HIPAA compliant message was left for patient on voicemail.  CSW  continues to await a return call.  CSW will mail an outreach letter to patient's home, encouraging patient to contact CSW at their earliest convenience, if patient is interested in receiving social work services through [place] with [facility].  If CSW does not receive a return call from patient within the next 10 business days, CSW will proceed with case closure.  Required number of phone attempts will have been made and outreach letter mailed.   [name], BSW, MSW, LCSW  Licensed Education officer, environmental [facility] System  [place] [address] [place], [place] [address] Physical Address-[address], [place], [place] [address] Toll Free Main # [phone] Fax # [phone] Cell # [phone]  Office # [phone] [email or web address]

## 2017-05-01 DIAGNOSIS — E1029 Type 1 diabetes mellitus with other diabetic kidney complication: Secondary | ICD-10-CM | POA: Diagnosis not present

## 2017-05-01 DIAGNOSIS — D509 Iron deficiency anemia, unspecified: Secondary | ICD-10-CM | POA: Diagnosis not present

## 2017-05-01 DIAGNOSIS — N2581 Secondary hyperparathyroidism of renal origin: Secondary | ICD-10-CM | POA: Diagnosis not present

## 2017-05-01 DIAGNOSIS — D631 Anemia in chronic kidney disease: Secondary | ICD-10-CM | POA: Diagnosis not present

## 2017-05-01 DIAGNOSIS — E8770 Fluid overload, unspecified: Secondary | ICD-10-CM | POA: Diagnosis not present

## 2017-05-01 DIAGNOSIS — N186 End stage renal disease: Secondary | ICD-10-CM | POA: Diagnosis not present

## 2017-05-02 DIAGNOSIS — E1029 Type 1 diabetes mellitus with other diabetic kidney complication: Secondary | ICD-10-CM | POA: Diagnosis not present

## 2017-05-02 DIAGNOSIS — D631 Anemia in chronic kidney disease: Secondary | ICD-10-CM | POA: Diagnosis not present

## 2017-05-02 DIAGNOSIS — E8770 Fluid overload, unspecified: Secondary | ICD-10-CM | POA: Diagnosis not present

## 2017-05-02 DIAGNOSIS — N186 End stage renal disease: Secondary | ICD-10-CM | POA: Diagnosis not present

## 2017-05-02 DIAGNOSIS — N2581 Secondary hyperparathyroidism of renal origin: Secondary | ICD-10-CM | POA: Diagnosis not present

## 2017-05-02 DIAGNOSIS — D509 Iron deficiency anemia, unspecified: Secondary | ICD-10-CM | POA: Diagnosis not present

## 2017-05-03 DIAGNOSIS — Z992 Dependence on renal dialysis: Secondary | ICD-10-CM | POA: Diagnosis not present

## 2017-05-03 DIAGNOSIS — E1122 Type 2 diabetes mellitus with diabetic chronic kidney disease: Secondary | ICD-10-CM | POA: Diagnosis not present

## 2017-05-03 DIAGNOSIS — N186 End stage renal disease: Secondary | ICD-10-CM | POA: Diagnosis not present

## 2017-05-04 DIAGNOSIS — N186 End stage renal disease: Secondary | ICD-10-CM | POA: Diagnosis not present

## 2017-05-04 DIAGNOSIS — D509 Iron deficiency anemia, unspecified: Secondary | ICD-10-CM | POA: Diagnosis not present

## 2017-05-04 DIAGNOSIS — D631 Anemia in chronic kidney disease: Secondary | ICD-10-CM | POA: Diagnosis not present

## 2017-05-04 DIAGNOSIS — E1029 Type 1 diabetes mellitus with other diabetic kidney complication: Secondary | ICD-10-CM | POA: Diagnosis not present

## 2017-05-04 DIAGNOSIS — N2581 Secondary hyperparathyroidism of renal origin: Secondary | ICD-10-CM | POA: Diagnosis not present

## 2017-05-07 DIAGNOSIS — D631 Anemia in chronic kidney disease: Secondary | ICD-10-CM | POA: Diagnosis not present

## 2017-05-07 DIAGNOSIS — N186 End stage renal disease: Secondary | ICD-10-CM | POA: Diagnosis not present

## 2017-05-07 DIAGNOSIS — E1029 Type 1 diabetes mellitus with other diabetic kidney complication: Secondary | ICD-10-CM | POA: Diagnosis not present

## 2017-05-07 DIAGNOSIS — D509 Iron deficiency anemia, unspecified: Secondary | ICD-10-CM | POA: Diagnosis not present

## 2017-05-07 DIAGNOSIS — N2581 Secondary hyperparathyroidism of renal origin: Secondary | ICD-10-CM | POA: Diagnosis not present
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
## 2017-05-08 ENCOUNTER — Encounter: Payer: Self-pay | Admitting: Cardiovascular Disease

## 2017-05-08 ENCOUNTER — Ambulatory Visit (INDEPENDENT_AMBULATORY_CARE_PROVIDER_SITE_OTHER): Payer: Medicare Other | Admitting: Cardiovascular Disease

## 2017-05-08 VITALS — BP 139/82 | HR 91 | Ht 71.0 in | Wt 207.6 lb

## 2017-05-08 DIAGNOSIS — I5032 Chronic diastolic (congestive) heart failure: Secondary | ICD-10-CM | POA: Diagnosis not present

## 2017-05-08 DIAGNOSIS — I119 Hypertensive heart disease without heart failure: Secondary | ICD-10-CM

## 2017-05-08 DIAGNOSIS — I43 Cardiomyopathy in diseases classified elsewhere: Secondary | ICD-10-CM | POA: Diagnosis not present

## 2017-05-08 DIAGNOSIS — Z992 Dependence on renal dialysis: Secondary | ICD-10-CM

## 2017-05-08 DIAGNOSIS — R079 Chest pain, unspecified: Secondary | ICD-10-CM | POA: Diagnosis not present

## 2017-05-08 DIAGNOSIS — N186 End stage renal disease: Secondary | ICD-10-CM

## 2017-05-08 DIAGNOSIS — I208 Other forms of angina pectoris: Secondary | ICD-10-CM | POA: Diagnosis not present

## 2017-05-08 NOTE — Patient Instructions (Signed)

## 2017-05-09 DIAGNOSIS — N186 End stage renal disease: Secondary | ICD-10-CM | POA: Diagnosis not present

## 2017-05-09 DIAGNOSIS — E1029 Type 1 diabetes mellitus with other diabetic kidney complication: Secondary | ICD-10-CM | POA: Diagnosis not present

## 2017-05-09 DIAGNOSIS — D509 Iron deficiency anemia, unspecified: Secondary | ICD-10-CM | POA: Diagnosis not present

## 2017-05-09 DIAGNOSIS — N2581 Secondary hyperparathyroidism of renal origin: Secondary | ICD-10-CM | POA: Diagnosis not present

## 2017-05-09 DIAGNOSIS — D631 Anemia in chronic kidney disease: Secondary | ICD-10-CM | POA: Diagnosis not present

## 2017-05-10 ENCOUNTER — Ambulatory Visit: Payer: Medicare Other | Attending: Family Medicine | Admitting: Pharmacist

## 2017-05-10 DIAGNOSIS — I110 Hypertensive heart disease with heart failure: Secondary | ICD-10-CM | POA: Insufficient documentation

## 2017-05-10 DIAGNOSIS — Z7901 Long term (current) use of anticoagulants: Secondary | ICD-10-CM | POA: Diagnosis not present

## 2017-05-10 DIAGNOSIS — E8770 Fluid overload, unspecified: Secondary | ICD-10-CM | POA: Diagnosis not present

## 2017-05-10 DIAGNOSIS — I5032 Chronic diastolic (congestive) heart failure: Secondary | ICD-10-CM | POA: Insufficient documentation

## 2017-05-10 DIAGNOSIS — R079 Chest pain, unspecified: Secondary | ICD-10-CM | POA: Insufficient documentation

## 2017-05-10 DIAGNOSIS — I82411 Acute embolism and thrombosis of right femoral vein: Secondary | ICD-10-CM

## 2017-05-10 DIAGNOSIS — Z86718 Personal history of other venous thrombosis and embolism: Secondary | ICD-10-CM | POA: Diagnosis not present

## 2017-05-10 LAB — POCT INR: INR: 3

## 2017-05-10 MED ORDER — SENNA 8.6 MG PO TABS: 2.0000 | ORAL_TABLET | Freq: Every evening | ORAL | 0 refills | Status: DC | PRN

## 2017-05-11 DIAGNOSIS — E1029 Type 1 diabetes mellitus with other diabetic kidney complication: Secondary | ICD-10-CM | POA: Diagnosis not present

## 2017-05-11 DIAGNOSIS — D631 Anemia in chronic kidney disease: Secondary | ICD-10-CM | POA: Diagnosis not present

## 2017-05-11 DIAGNOSIS — N186 End stage renal disease: Secondary | ICD-10-CM | POA: Diagnosis not present

## 2017-05-11 DIAGNOSIS — D509 Iron deficiency anemia, unspecified: Secondary | ICD-10-CM | POA: Diagnosis not present

## 2017-05-11 DIAGNOSIS — N2581 Secondary hyperparathyroidism of renal origin: Secondary | ICD-10-CM | POA: Diagnosis not present

## 2017-05-14 ENCOUNTER — Other Ambulatory Visit: Payer: Self-pay | Admitting: *Deleted

## 2017-05-14 DIAGNOSIS — E1029 Type 1 diabetes mellitus with other diabetic kidney complication: Secondary | ICD-10-CM | POA: Diagnosis not present

## 2017-05-14 DIAGNOSIS — N2581 Secondary hyperparathyroidism of renal origin: Secondary | ICD-10-CM | POA: Diagnosis not present

## 2017-05-14 DIAGNOSIS — D509 Iron deficiency anemia, unspecified: Secondary | ICD-10-CM | POA: Diagnosis not present

## 2017-05-14 DIAGNOSIS — N186 End stage renal disease: Secondary | ICD-10-CM | POA: Diagnosis not present

## 2017-05-14 DIAGNOSIS — D631 Anemia in chronic kidney disease: Secondary | ICD-10-CM | POA: Diagnosis not present

## 2017-05-14 NOTE — Patient Outreach (Signed)
[facility]) Care Management  7/32/2025  [name] [birth year] [id number]  CSW received a HIPAA compliant email message from Georgie Chard, [place] offering to assist CSW with trying to obtain initial contact with patient to perform phone assessment, as well as assess and assist with social work needs and services.  Mrs. Donne Anon noted that CSW has made three previous attempts to try and outreach to patient, without success.  Mrs. Donne Anon reported that patient attends the [facility] on Tuesday/Thursday/Saturday, first shift.  Mrs. Donne Anon agreed to provide patient with CSW's contact information, encouraging patient to contact CSW directly if he is interested in receiving social work services and resources.  CSW will await a return call.  In the meantime, CSW will follow protocol and close patient's case, as three outreach attempts have been made and an outreach letter was mailed to patient's home, allowing 10 business days for a return call.  No call back attempts have been made thus far.  CSW will notify patient's RNCM with [place] [facility], Tish Men of CSW's plans to close patient's case.  CSW will fax an update to patient's Primary Care Physician, Dr. Arnoldo Morale to ensure that they are aware of CSW's involvement with patient's plan of care.  CSW will submit a case closure request to Verlon Setting, Care Management Assistant with [place] Management, in the form of an [employer].  CSW will ensure that Mrs. Comer is aware of Mrs. Satterfield's, RNCM with [facility], continued involvement with patient's care.   [name], BSW, MSW, LCSW  Licensed Education officer, environmental Health System  [place] [place] [address], [place], [place] [address] Physical Address-[address], [place], [place] [address] Toll Free Main # [phone] Fax # [phone] Cell # [phone]  Office # [phone] [email or web address]

## 2017-05-16 DIAGNOSIS — N186 End stage renal disease: Secondary | ICD-10-CM | POA: Diagnosis not present

## 2017-05-16 DIAGNOSIS — E1029 Type 1 diabetes mellitus with other diabetic kidney complication: Secondary | ICD-10-CM | POA: Diagnosis not present

## 2017-05-16 DIAGNOSIS — D509 Iron deficiency anemia, unspecified: Secondary | ICD-10-CM | POA: Diagnosis not present

## 2017-05-16 DIAGNOSIS — D631 Anemia in chronic kidney disease: Secondary | ICD-10-CM | POA: Diagnosis not present

## 2017-05-16 DIAGNOSIS — N2581 Secondary hyperparathyroidism of renal origin: Secondary | ICD-10-CM | POA: Diagnosis not present

## 2017-05-21 ENCOUNTER — Encounter: Payer: Self-pay | Admitting: *Deleted

## 2017-05-21 ENCOUNTER — Encounter (HOSPITAL_COMMUNITY): Payer: Self-pay | Admitting: *Deleted

## 2017-05-21 ENCOUNTER — Emergency Department (HOSPITAL_COMMUNITY)
Admission: EM | Admit: 2017-05-21 | Discharge: 2017-05-21 | Disposition: A | Discharge status: Home / Self Care | Payer: Medicare Other | Attending: Emergency Medicine | Admitting: Emergency Medicine

## 2017-05-21 ENCOUNTER — Other Ambulatory Visit: Payer: Self-pay | Admitting: *Deleted

## 2017-05-21 DIAGNOSIS — Z79899 Other long term (current) drug therapy: Secondary | ICD-10-CM | POA: Insufficient documentation

## 2017-05-21 DIAGNOSIS — Z7901 Long term (current) use of anticoagulants: Secondary | ICD-10-CM | POA: Insufficient documentation

## 2017-05-21 DIAGNOSIS — E1122 Type 2 diabetes mellitus with diabetic chronic kidney disease: Secondary | ICD-10-CM | POA: Insufficient documentation

## 2017-05-21 DIAGNOSIS — T829XXA Unspecified complication of cardiac and vascular prosthetic device, implant and graft, initial encounter: Secondary | ICD-10-CM | POA: Diagnosis not present

## 2017-05-21 DIAGNOSIS — E1065 Type 1 diabetes mellitus with hyperglycemia: Secondary | ICD-10-CM | POA: Diagnosis not present

## 2017-05-21 DIAGNOSIS — Y811 Therapeutic (nonsurgical) and rehabilitative general- and plastic-surgery devices associated with adverse incidents: Secondary | ICD-10-CM | POA: Insufficient documentation

## 2017-05-21 DIAGNOSIS — N186 End stage renal disease: Secondary | ICD-10-CM | POA: Insufficient documentation

## 2017-05-21 DIAGNOSIS — E1029 Type 1 diabetes mellitus with other diabetic kidney complication: Secondary | ICD-10-CM | POA: Diagnosis not present

## 2017-05-21 DIAGNOSIS — T82838A Hemorrhage of vascular prosthetic devices, implants and grafts, initial encounter: Secondary | ICD-10-CM | POA: Diagnosis not present

## 2017-05-21 DIAGNOSIS — I120 Hypertensive chronic kidney disease with stage 5 chronic kidney disease or end stage renal disease: Secondary | ICD-10-CM | POA: Diagnosis not present

## 2017-05-21 DIAGNOSIS — N2581 Secondary hyperparathyroidism of renal origin: Secondary | ICD-10-CM | POA: Diagnosis not present

## 2017-05-21 DIAGNOSIS — Z7984 Long term (current) use of oral hypoglycemic drugs: Secondary | ICD-10-CM | POA: Insufficient documentation

## 2017-05-21 DIAGNOSIS — D509 Iron deficiency anemia, unspecified: Secondary | ICD-10-CM | POA: Diagnosis not present

## 2017-05-21 DIAGNOSIS — Z992 Dependence on renal dialysis: Secondary | ICD-10-CM | POA: Insufficient documentation

## 2017-05-21 DIAGNOSIS — R739 Hyperglycemia, unspecified: Secondary | ICD-10-CM | POA: Diagnosis not present

## 2017-05-21 DIAGNOSIS — D631 Anemia in chronic kidney disease: Secondary | ICD-10-CM | POA: Diagnosis not present

## 2017-05-21 LAB — PROTIME-INR
INR: 7.58 — AB
PROTHROMBIN TIME: 63.7 s — AB (ref 11.4–15.2)

## 2017-05-21 NOTE — Patient Outreach (Signed)
[facility]) Care Management  4/31/5400  [name] [birth year] [id number]   Call received from patient today in response to the HIPAA compliant messages left for patient several weeks ago by CSW.  CSW was able to make to perform the initial phone assessment, as well as assess and assist with social work needs and services.  CSW introduced self, explained role and types of services provided through [facility] [facility] ([place] [facility]).  CSW further explained to patient that CSW works with Marthenia Rolling, [facility], also with [place] [employer]. CSW then explained the reason for the call, indicating that [name] thought that patient would benefit from social work services and resources to assist with referrals to various community agencies.  Mrs. Brewer went on to say that patient lacks transportation to and from his physician appointments, as well as dialysis treatments.  Last, [name] reported that patient is homeless, currently residing in a shelter.  CSW obtained two HIPAA compliant identifiers from patient, which included patient's name and date of birth. Patient apologized to CSW for not returning the call sooner. Patient called CSW from the [facility], where patient receives hemodialysis on Tuesday's, Thursday's and Saturday's.  The social worker at the center was able to assist patient with making the call.  Patient reported that he is definitely interested in social work services and wanted to know what CSW had to offer.  CSW referred back to the above information, for which patient agreed, he would very much like assistance with obtaining safe and affordable housing and transportation.  Patient is an Adult Medicaid recipient, but is not currently using Medicaid Transportation for any of his transport needs.  Patient has been approved for SCAT Paramedic), but admits that he is unable to  afford the round-trip fare of $3.00 per day, three days per week, every week, just for his dialysis treatments, for a total of $36.00 per month.  CSW agreed to provide patient with SCAT passes, while CSW tries to assist patient with better budgeting his finances each month so that he will be able to afford to pay for all his transportation needs.  CSW will also assist patient with arranging transportation services through [employer] with the [place]. Patient reported that his living arrangements are not the most secure or safe; therefore, CSW agreed to assist patient with completing an application for Section 8 Housing.  Patient currently lives with his significant other, [name].  Patient wants to continue to be able to live with Ms. [name] and is wanting to stay in [facility].  CSW agreed to meet with patient for an initial home visit to discuss all of the above information in greater detail.  The initial home visit is scheduled for Thursday, September 20th at 1:30pm at patient's current place of residence.  In the meantime, CSW will mail patient all of the following information: [place] for Food Stamps The [place] in [place] in [place] for [place] was able to ensure that patient has the correct contact information for CSW, encouraging patient to contact CSW directly if social work needs arise in the meantime.
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
## 2017-05-21 NOTE — ED Triage Notes (Signed)
To ED, via [facility], for eval of bleeding dialysis graft. Pt states he was at home after dialysis. Bleeding controlled on arrival.

## 2017-05-21 NOTE — ED Notes (Signed)
Lab called with critical after patient discharged. RN retrieved patient from bus stop. Pt endorses med complioance. Denies etoh use, change in diet. Hina PA made aware

## 2017-05-21 NOTE — Discharge Instructions (Addendum)
Discontinue warfarin (Coumadin). Call primary care provider tomorrow for further evaluation and repeat INR testing. Keep quick clot gauze and wrapping applied to the area until next dialysis appointment. Continue home medications as previously prescribed. Return to ED for worsening bleeding, bleeding from other sites, lightheadedness, numbness and arm, injury or falls.

## 2017-05-21 NOTE — ED Notes (Signed)
Discussed holding coumadin and follow up with PCP tomorrow

## 2017-05-22 ENCOUNTER — Other Ambulatory Visit: Payer: Self-pay | Admitting: *Deleted

## 2017-05-22 ENCOUNTER — Telehealth: Payer: Self-pay

## 2017-05-22 NOTE — Telephone Encounter (Signed)
Patient returned call. He will come to clinic tomorrow for INR. Denies any continued bleeding. Instructed to go to emergency room if he has repeat bleeding or would fall and hit his head. Patient to hold coumadin until seen. Patient verbalized understanding.

## 2017-05-22 NOTE — Telephone Encounter (Signed)
Pt contacted the office and stated he went to hospital yesterday and coumadin level was 7 and pt wants to know does he need to continue to take it or what does he need to do. Pt states he didn't take his coumadin last night. Pt is requesting a call back from either his pcp or clinical pharmacist

## 2017-05-22 NOTE — Patient Outreach (Signed)
[facility]) Care Management  1/95/0932  [name] [birth year] [id number]   CSW received a call from patient requesting to reschedule his initial home visit.  The initial home visit has been reschedule from Thursday, September 20th at 1:30pm to Monday, September 24th at 10:30am, per patient's request. [name], BSW, MSW, [place]  Licensed Clinical Social Worker  [place]  Mailing [place]. [address] [place], [place] [address] Physical Address-[address], [place],  [address] Toll Free Main # [phone] Fax # [phone] Cell # [phone]  Office # [phone] [email or web address]

## 2017-05-22 NOTE — Telephone Encounter (Signed)
Returned patient call. Patient needs to be seen in office today or tomorrow for INR and should not resume until we have confirmed INR is not supratherapeutic. Unable to reach patient, left HIPAA-compliant voicemail requesting that he schedule an appt for today or tomorrow to be seen.

## 2017-05-22 NOTE — Patient Outreach (Signed)
Request received from Joanna Saporito, LCSW to mail patient personal care resources.  Information mailed today. 

## 2017-05-23 ENCOUNTER — Telehealth: Payer: Self-pay | Admitting: Family Medicine

## 2017-05-23 ENCOUNTER — Ambulatory Visit: Payer: Medicare Other | Attending: Family Medicine | Admitting: Pharmacist

## 2017-05-23 ENCOUNTER — Ambulatory Visit: Payer: Self-pay | Admitting: *Deleted

## 2017-05-23 DIAGNOSIS — D509 Iron deficiency anemia, unspecified: Secondary | ICD-10-CM | POA: Diagnosis not present

## 2017-05-23 DIAGNOSIS — N186 End stage renal disease: Secondary | ICD-10-CM | POA: Diagnosis not present

## 2017-05-23 DIAGNOSIS — I82411 Acute embolism and thrombosis of right femoral vein: Secondary | ICD-10-CM

## 2017-05-23 DIAGNOSIS — Z7901 Long term (current) use of anticoagulants: Secondary | ICD-10-CM | POA: Insufficient documentation

## 2017-05-23 DIAGNOSIS — N2581 Secondary hyperparathyroidism of renal origin: Secondary | ICD-10-CM | POA: Diagnosis not present

## 2017-05-23 DIAGNOSIS — D631 Anemia in chronic kidney disease: Secondary | ICD-10-CM | POA: Diagnosis not present

## 2017-05-23 DIAGNOSIS — E1029 Type 1 diabetes mellitus with other diabetic kidney complication: Secondary | ICD-10-CM | POA: Diagnosis not present

## 2017-05-23 LAB — POCT INR

## 2017-05-23 NOTE — Telephone Encounter (Signed)
Pt would like to know if he will be getting vitamin K prescribed . Please f/up

## 2017-05-23 NOTE — Telephone Encounter (Signed)
Addressed in visit - patient will be given vitamin k in office tomorrow if needed.

## 2017-05-23 NOTE — Progress Notes (Signed)
    Pharmacy Anticoagulation Clinic  Subjective: Patient presents today for INR monitoring. Anticoagulation indication is DVT   Current dose of warfarin: holding due to elevated INR  Adherence to warfarin:  Signs/symptoms of bleeding: reports some minor bleeding with dialysis Recent changes in diet: denies Recent changes in medications: denies Upcoming procedures that may impact anticoagulation: denies   Objective: Today's INR = >8.0  Lab Results  Component Value Date   INR >8.0 05/23/2017   INR 7.58 (HH) 05/21/2017   INR 3.0 05/10/2017     Assessment and Plan: Anticoagulation: Patient is Supratherapeutic based on patient's INR of >8 and patient's INR goal of 2-3. No s/sx of bleeding, no reports of SOB, headache, chest pain, no recent falls. Patient only had some bleeding at hemodialysis the other day and did not have any issues today. Unable to get venous draw as lab was closed. INR with [place] is not accurate >5 so venous draw will need to done tomorrow when lab is open.  Will continue to hold warfarin. Discussed with Dr. Doreene Burke and patient will return tomorrow for repeat INR and if still elevated, will administer vitamin K in clinic. Patient aware of plan and will return tomorrow but immediately go to ED or call 911 for any bleeding or falls.  Patient verbalized understanding and was provided with written instructions. Next INR check planned for tomorrow. Patient will be scheduled with walk-in provider as I will not be in office.

## 2017-05-24 ENCOUNTER — Ambulatory Visit: Payer: Self-pay | Admitting: Family Medicine

## 2017-05-24 ENCOUNTER — Ambulatory Visit: Payer: Medicare Other | Attending: Family Medicine | Admitting: Physician Assistant

## 2017-05-24 VITALS — BP 131/81 | HR 93 | Temp 97.8°F | Resp 18 | Ht 70.0 in | Wt 212.0 lb

## 2017-05-24 DIAGNOSIS — E1121 Type 2 diabetes mellitus with diabetic nephropathy: Secondary | ICD-10-CM

## 2017-05-24 DIAGNOSIS — I208 Other forms of angina pectoris: Secondary | ICD-10-CM

## 2017-05-24 DIAGNOSIS — D689 Coagulation defect, unspecified: Secondary | ICD-10-CM | POA: Diagnosis not present

## 2017-05-24 DIAGNOSIS — Z7901 Long term (current) use of anticoagulants: Secondary | ICD-10-CM | POA: Diagnosis not present

## 2017-05-24 DIAGNOSIS — Z86711 Personal history of pulmonary embolism: Secondary | ICD-10-CM | POA: Diagnosis not present

## 2017-05-24 DIAGNOSIS — Z7984 Long term (current) use of oral hypoglycemic drugs: Secondary | ICD-10-CM | POA: Diagnosis not present

## 2017-05-24 DIAGNOSIS — I2699 Other pulmonary embolism without acute cor pulmonale: Secondary | ICD-10-CM | POA: Diagnosis not present

## 2017-05-24 LAB — GLUCOSE, POCT (MANUAL RESULT ENTRY): POC Glucose: 97 mg/dl (ref 70–99)

## 2017-05-24 LAB — POCT INR: INR: 4.5

## 2017-05-24 NOTE — Progress Notes (Signed)
JA 

## 2017-05-24 NOTE — Patient Instructions (Signed)
DO NOT TAKE COUMADIN/WARFARIN TONIGHT NOR THIS WEEKEND. RETURN HERE ON Monday FOR A RECHECK.  NO NEED TO EAT GREEN LEAFY VEGGIES, LEVEL IS COMING DOWN ON ITS OWN  NO ANTIBIOTICS  NO SMOKING  NO ALCOHOL

## 2017-05-24 NOTE — Progress Notes (Signed)
Chief Complaint: "i need my labs checked"  Subjective: This is a pleasant Hispanic male who is on warfarin therapy for recent diagnosis of pulmonary emboli who returns today for repeat INR check. Earlier this week she went to the emergency department as he was bleeding from his dialysis catheter site. His INR was was elevated and he was told to hold his warfarin. His last dose of warfarin on Monday night. He will return for repeat visit 3 days ago and his INR registered greater than 8 at that time. He has continued to hold his medication. Today he returns. No further bleeding from his catheter site. No nosebleeds. He does have some bleeding when he brushes his teeth. No new complaints today.   Objective:  Vitals:   05/24/17 1131  BP: 131/81  Pulse: 93  Resp: 18  Temp: 97.8 F (36.6 C)  TempSrc: Oral  SpO2: 96%  Weight: 212 lb (96.2 kg)  Height: 5\' 10"  (1.778 m)    Physical Exam:  General: in no acute distress. HEENT: no pallor, no icterus, moist oral mucosa, no JVD, no lymphadenopathy Heart: Normal  s1 &s2  Regular rate and rhythm, without murmurs, rubs, gallops. Lungs: Clear to auscultation bilaterally. Abdomen: Soft, nontender, nondistended, positive bowel sounds. Extremities: No clubbing cyanosis or edema with positive pedal pulses. Neuro: Alert, awake, oriented x3, nonfocal.  Pertinent Lab Results:INR 4.5   Medications: Prior to Admission medications   Medication Sig Start Date End Date Taking? Authorizing Provider  acetaminophen (TYLENOL) 325 MG tablet Take 2 tablets (650 mg total) by mouth every 6 (six) hours as needed for mild pain or moderate pain (or Fever >/= 101). 04/11/17   Hongalgi, Lenis Dickinson, MD  amLODipine (NORVASC) 5 MG tablet Take 1 tablet (5 mg total) by mouth daily. 04/22/17 07/21/17  Arnoldo Morale, MD  cinacalcet (SENSIPAR) 60 MG tablet Take 60 mg by mouth daily.    [provider]  glipiZIDE (GLUCOTROL) 5 MG tablet Take 1 tablet (5 mg total) by mouth  2 (two) times daily before a meal. 04/15/17   Arnoldo Morale, MD  lanthanum (FOSRENOL) 1000 MG chewable tablet Chew 1,000 mg by mouth 3 (three) times daily with meals. Reported on 12/21/2015    [provider]  losartan (COZAAR) 25 MG tablet Take 25 mg by mouth at bedtime. Reported on 12/21/2015    [provider]  multivitamin (RENA-VIT) TABS tablet Take 1 tablet by mouth at bedtime. 04/11/17   Hongalgi, Lenis Dickinson, MD  omeprazole (PRILOSEC) 20 MG capsule Take 1 capsule (20 mg total) by mouth daily. 04/26/17   Arnoldo Morale, MD  polyethylene glycol (MIRALAX / GLYCOLAX) packet Take 17 g by mouth daily. 04/11/17   Hongalgi, Lenis Dickinson, MD  senna (SENOKOT) 8.6 MG TABS tablet Take 2 tablets (17.2 mg total) by mouth at bedtime as needed for mild constipation or moderate constipation. 05/10/17   Arnoldo Morale, MD  warfarin (COUMADIN) 5 MG tablet Take 2 tabs (10 mg) on Mondays and Wednesdays and 1.5 tab (7.5mg ) on other days 04/26/17   Arnoldo Morale, MD    Assessment: History of pulmonary emboli on chronic warfarin therapy with coagulopathy  Plan: Continue to hold warfarin. No reversal agents used today. INR continuing to come down on its own. Encouraged to eat his normal diet, no excess green leafy vegetables. Inform us if any medication changes occurred over the weekend. Return on Monday for repeat INR check with PharmD.  Follow up:3 days   This note has been created  with Surveyor, quantity. Any transcriptional errors are unintentional.   Zettie Pho, PA-C 05/24/2017, 1:17 PM

## 2017-05-25 DIAGNOSIS — D631 Anemia in chronic kidney disease: Secondary | ICD-10-CM | POA: Diagnosis not present

## 2017-05-25 DIAGNOSIS — N2581 Secondary hyperparathyroidism of renal origin: Secondary | ICD-10-CM | POA: Diagnosis not present

## 2017-05-25 DIAGNOSIS — D509 Iron deficiency anemia, unspecified: Secondary | ICD-10-CM | POA: Diagnosis not present

## 2017-05-25 DIAGNOSIS — N186 End stage renal disease: Secondary | ICD-10-CM | POA: Diagnosis not present

## 2017-05-25 DIAGNOSIS — E1029 Type 1 diabetes mellitus with other diabetic kidney complication: Secondary | ICD-10-CM | POA: Diagnosis not present

## 2017-05-27 ENCOUNTER — Ambulatory Visit: Payer: Medicare Other | Attending: Family Medicine | Admitting: Pharmacist

## 2017-05-27 ENCOUNTER — Other Ambulatory Visit: Payer: Self-pay | Admitting: *Deleted

## 2017-05-27 DIAGNOSIS — I82411 Acute embolism and thrombosis of right femoral vein: Secondary | ICD-10-CM

## 2017-05-27 DIAGNOSIS — Z992 Dependence on renal dialysis: Secondary | ICD-10-CM | POA: Diagnosis not present

## 2017-05-27 DIAGNOSIS — I208 Other forms of angina pectoris: Secondary | ICD-10-CM

## 2017-05-27 DIAGNOSIS — Z86718 Personal history of other venous thrombosis and embolism: Secondary | ICD-10-CM | POA: Insufficient documentation

## 2017-05-27 DIAGNOSIS — Z7901 Long term (current) use of anticoagulants: Secondary | ICD-10-CM | POA: Diagnosis not present

## 2017-05-27 LAB — POCT INR: INR: 1.3

## 2017-05-27 NOTE — Patient Outreach (Signed)
[facility]) Care Management  05/17/7828  [name] [birth year] [id number]   CSW was able to meet with patient today, along with Sheppard Evens, [place] with [facility] [employer], to complete the initial home visit with patient.  Patient first admitted that housing is no longer an issue for him, as he and his finance, [name] have worked things out and he will continue to reside with her in her [place].  Patient indicated that he and Ms. [name] are able to afford their monthly rent payment, even though their rent went up significantly once patient stopped paying for child support.  Patient admitted that his wages through [employer] were garnished until he made his last child support payment. Patient reported that his main issue is transportation, as he is having to go to and from dialysis treatments three days per week, in addition to any doctors appointments that he is scheduled to attend.  CSW provided patient with three SCAT Paramedic) passes today, agreeing to mail him additional passes, along with a packet of resources that may be beneficial to him and Ms. [name].  CSW also spoke with patient at length about [employer], through the [place], and Orthoptist through [employer] benefit. Patient was not aware that he is eligible to receive transportation services through Adult Medicaid, as he is a current recipient.  CSW spoke with patient at length about Medicaid Transportation, agreeing to make a referral for services.  In addition, CSW explained the [place] transportation service through [employer]. CSW also agreed to mail patient a brochure for each agency, explaining the various services that are provided.  Patient is aware that he is entitled to 28 one-way rides or 6 roundtrip rides through  [employer]. Last, CSW agreed to mail patient all of the following resource information: Advanced Directives (Living Will and [place] documents) List of [employer] and [place] [employer] [place] in [place] in [place] CSW then agreed to follow-up with patient in one week to ensure that he received the packet of resources, as well as answer any questions he may have at that time.  Patient voiced understanding and was agreeable to this plan. [name], BSW, MSW, LCSW  Licensed Education officer, [facility]  Mailing [place] [place], [place] [address] Physical Address-[address], [place], [place] [address] Toll Free Main # [phone] Fax # [phone] Cell # [phone]  Office # [phone] [email or web address]

## 2017-05-27 NOTE — Progress Notes (Signed)
    Pharmacy Anticoagulation Clinic  Subjective: Patient presents today for INR monitoring. Anticoagulation indication is DVT   Current dose of warfarin: holding due to elevated INR  Adherence to warfarin: has been holding it. Signs/symptoms of bleeding: denies Recent changes in diet: denies Recent changes in medications: denies Upcoming procedures that may impact anticoagulation: denies   Objective: Today's INR = 1.3  Lab Results  Component Value Date   INR 1.3 05/27/2017   INR 4.5 05/24/2017   INR >8.0 05/23/2017     Assessment and Plan: Anticoagulation: INR currently subtherapeutic as a result of holding the warfarin. Restart at warfarin 5 mg daily. Will closely monitor. Still no known precipitating factor that caused elevated INR. Patient on dialysis and bleed risk so will not bridge at this time. Will repeat INR in 2 days (previously scheduled appt).  Patient verbalized understanding and was provided with written instructions. Patient to report to ED or call 911 with any leg swelling, leg erythema, SOB, falls, or s/sx of bleeding.

## 2017-05-27 NOTE — Patient Outreach (Signed)
Request received from Joanna Saporito, LCSW to mail patient personal care resources.  Information mailed today. 

## 2017-05-28 DIAGNOSIS — N2581 Secondary hyperparathyroidism of renal origin: Secondary | ICD-10-CM | POA: Diagnosis not present

## 2017-05-28 DIAGNOSIS — D631 Anemia in chronic kidney disease: Secondary | ICD-10-CM | POA: Diagnosis not present

## 2017-05-28 DIAGNOSIS — E1029 Type 1 diabetes mellitus with other diabetic kidney complication: Secondary | ICD-10-CM | POA: Diagnosis not present

## 2017-05-28 DIAGNOSIS — N186 End stage renal disease: Secondary | ICD-10-CM | POA: Diagnosis not present

## 2017-05-28 DIAGNOSIS — D509 Iron deficiency anemia, unspecified: Secondary | ICD-10-CM | POA: Diagnosis not present

## 2017-05-29 ENCOUNTER — Ambulatory Visit: Payer: Medicare Other | Attending: Family Medicine | Admitting: Pharmacist

## 2017-05-29 ENCOUNTER — Other Ambulatory Visit: Payer: Self-pay

## 2017-05-29 DIAGNOSIS — I2699 Other pulmonary embolism without acute cor pulmonale: Secondary | ICD-10-CM

## 2017-05-29 DIAGNOSIS — I82411 Acute embolism and thrombosis of right femoral vein: Secondary | ICD-10-CM

## 2017-05-29 DIAGNOSIS — Z7901 Long term (current) use of anticoagulants: Secondary | ICD-10-CM | POA: Insufficient documentation

## 2017-05-29 LAB — POCT INR: INR: 1.2

## 2017-05-29 MED FILL — OMEPRAZOLE DR 20 MG CAPSULE: 20 | 30 days supply | Qty: 30 | Fill #1

## 2017-05-29 MED FILL — glipiZIDE 5 MG TABS: 5 | 30 days supply | Qty: 60 | Fill #1

## 2017-05-29 NOTE — Addendum Note (Signed)
Addended by: Rica Mast on: 05/29/2017 10:15 AM   Modules accepted: Level of Service

## 2017-05-29 NOTE — Patient Outreach (Signed)
[facility]) Care Management T 15/40/08  [name] 6/76/1950 [id number]  RNCM was never able to make contact with patient and never received return call following letter to patient. Will perform case closure today.  [place] R. Aviv Lengacher, RN, BSN, [place] Management Coordinator [phone]

## 2017-05-29 NOTE — Progress Notes (Signed)
    Pharmacy Anticoagulation Clinic  Subjective: Patient presents today for INR monitoring. Anticoagulation indication is DVT   Current dose of warfarin: 5 mg daily, just started back on it on Monday  Adherence to warfarin: denies missed doses. Signs/symptoms of bleeding: denies SOB/leg swelling or redness: denies Recent changes in diet: denies Recent changes in medications: denies Upcoming procedures that may impact anticoagulation: denies   Objective: Today's INR = 1.2  Lab Results  Component Value Date   INR 1.2 05/29/2017   INR 1.3 05/27/2017   INR 4.5 05/24/2017     Assessment and Plan: Anticoagulation: INR currently subtherapeutic as a result of holding the warfarin. Patient to take 7.5 mg tonight and tomorrow and then continue on warfarin 5 mg daily. Not a candidate for enoxaparin as patient is on dialysis. Will recheck INR on Monday.   Patient verbalized understanding and was provided with written instructions. Patient to report to ED or call 911 with any leg swelling, leg erythema, SOB, falls, or s/sx of bleeding.

## 2017-05-30 DIAGNOSIS — N186 End stage renal disease: Secondary | ICD-10-CM | POA: Diagnosis not present

## 2017-05-30 DIAGNOSIS — E1029 Type 1 diabetes mellitus with other diabetic kidney complication: Secondary | ICD-10-CM | POA: Diagnosis not present

## 2017-05-30 DIAGNOSIS — D631 Anemia in chronic kidney disease: Secondary | ICD-10-CM | POA: Diagnosis not present

## 2017-05-30 DIAGNOSIS — N2581 Secondary hyperparathyroidism of renal origin: Secondary | ICD-10-CM | POA: Diagnosis not present

## 2017-05-30 DIAGNOSIS — D509 Iron deficiency anemia, unspecified: Secondary | ICD-10-CM | POA: Diagnosis not present

## 2017-06-01 DIAGNOSIS — D509 Iron deficiency anemia, unspecified: Secondary | ICD-10-CM | POA: Diagnosis not present

## 2017-06-01 DIAGNOSIS — D631 Anemia in chronic kidney disease: Secondary | ICD-10-CM | POA: Diagnosis not present

## 2017-06-01 DIAGNOSIS — N186 End stage renal disease: Secondary | ICD-10-CM | POA: Diagnosis not present

## 2017-06-01 DIAGNOSIS — N2581 Secondary hyperparathyroidism of renal origin: Secondary | ICD-10-CM | POA: Diagnosis not present

## 2017-06-01 DIAGNOSIS — E1029 Type 1 diabetes mellitus with other diabetic kidney complication: Secondary | ICD-10-CM | POA: Diagnosis not present

## 2017-06-02 DIAGNOSIS — Z992 Dependence on renal dialysis: Secondary | ICD-10-CM | POA: Diagnosis not present

## 2017-06-02 DIAGNOSIS — E1122 Type 2 diabetes mellitus with diabetic chronic kidney disease: Secondary | ICD-10-CM | POA: Diagnosis not present

## 2017-06-02 DIAGNOSIS — N186 End stage renal disease: Secondary | ICD-10-CM | POA: Diagnosis not present

## 2017-06-03 ENCOUNTER — Ambulatory Visit: Payer: Medicare Other | Attending: Family Medicine | Admitting: Pharmacist

## 2017-06-03 DIAGNOSIS — I82401 Acute embolism and thrombosis of unspecified deep veins of right lower extremity: Secondary | ICD-10-CM | POA: Diagnosis not present

## 2017-06-03 DIAGNOSIS — I82411 Acute embolism and thrombosis of right femoral vein: Secondary | ICD-10-CM

## 2017-06-03 DIAGNOSIS — I208 Other forms of angina pectoris: Secondary | ICD-10-CM

## 2017-06-03 DIAGNOSIS — I2699 Other pulmonary embolism without acute cor pulmonale: Secondary | ICD-10-CM

## 2017-06-03 LAB — POCT INR: INR: 3.9

## 2017-06-03 MED ORDER — WARFARIN SODIUM 5 MG PO TABS: ORAL_TABLET | ORAL | 2 refills | Status: DC

## 2017-06-03 MED FILL — WARFARIN SODIUM 5 MG TABLET: 5 | 30 days supply | Qty: 30 | Fill #0

## 2017-06-04 ENCOUNTER — Encounter: Payer: Self-pay | Admitting: *Deleted

## 2017-06-04 ENCOUNTER — Other Ambulatory Visit: Payer: Self-pay | Admitting: *Deleted

## 2017-06-04 DIAGNOSIS — N186 End stage renal disease: Secondary | ICD-10-CM | POA: Diagnosis not present

## 2017-06-04 DIAGNOSIS — N2581 Secondary hyperparathyroidism of renal origin: Secondary | ICD-10-CM | POA: Diagnosis not present

## 2017-06-04 DIAGNOSIS — D631 Anemia in chronic kidney disease: Secondary | ICD-10-CM | POA: Diagnosis not present

## 2017-06-04 DIAGNOSIS — Z992 Dependence on renal dialysis: Secondary | ICD-10-CM | POA: Diagnosis not present

## 2017-06-04 NOTE — Patient Outreach (Signed)
[facility]) Care Management  19/09/6604  CARRON JAGGI [birth year] [id number]   CSW was able to make contact with patient today to follow-up regarding social work services and resources, as well as to ensure that patient received the packet of resources mailed to his home.  Patient confirmed that he had, very appreciative of the information received, as well as the [employer] Par[employer]) passes given.  Patient is aware that he can also utilize [employer] for his transportation needs.  Patient denied having any additional social work needs at present. CSW will perform a case closure on patient, as all goals of treatment have been met from social work standpoint and no additional social work needs have been identified at this time.  CSW will fax an update to patient's Primary Care Physician, Dr. Arnoldo Morale to ensure that they are aware of CSW's involvement with patient's plan of care.  CSW will submit a case closure request to Verlon Setting, Care Management Assistant with [facility], in the form of an [employer].   [name], BSW, MSW, LCSW  Licensed Education officer, environmental [facility]  Mailing [place] [address]. [address], [place], [place] [address] Physical Address-[address], [place], [place] [address] Toll Free Main # [phone] Fax # [phone] Cell # [phone]  Office # [phone] [email or web address]

## 2017-06-06 DIAGNOSIS — N2581 Secondary hyperparathyroidism of renal origin: Secondary | ICD-10-CM | POA: Diagnosis not present

## 2017-06-06 DIAGNOSIS — N186 End stage renal disease: Secondary | ICD-10-CM | POA: Diagnosis not present

## 2017-06-06 DIAGNOSIS — D631 Anemia in chronic kidney disease: Secondary | ICD-10-CM | POA: Diagnosis not present

## 2017-06-06 DIAGNOSIS — Z992 Dependence on renal dialysis: Secondary | ICD-10-CM | POA: Diagnosis not present

## 2017-06-08 DIAGNOSIS — N186 End stage renal disease: Secondary | ICD-10-CM | POA: Diagnosis not present

## 2017-06-08 DIAGNOSIS — Z992 Dependence on renal dialysis: Secondary | ICD-10-CM | POA: Diagnosis not present

## 2017-06-08 DIAGNOSIS — N2581 Secondary hyperparathyroidism of renal origin: Secondary | ICD-10-CM | POA: Diagnosis not present

## 2017-06-08 DIAGNOSIS — D631 Anemia in chronic kidney disease: Secondary | ICD-10-CM | POA: Diagnosis not present

## 2017-06-10 ENCOUNTER — Ambulatory Visit: Payer: Medicare Other | Attending: Family Medicine | Admitting: Pharmacist

## 2017-06-10 DIAGNOSIS — Z86718 Personal history of other venous thrombosis and embolism: Secondary | ICD-10-CM | POA: Insufficient documentation

## 2017-06-10 DIAGNOSIS — I208 Other forms of angina pectoris: Secondary | ICD-10-CM | POA: Diagnosis not present

## 2017-06-10 DIAGNOSIS — Z7901 Long term (current) use of anticoagulants: Secondary | ICD-10-CM | POA: Diagnosis not present

## 2017-06-10 DIAGNOSIS — Z5181 Encounter for therapeutic drug level monitoring: Secondary | ICD-10-CM | POA: Insufficient documentation

## 2017-06-10 DIAGNOSIS — I82411 Acute embolism and thrombosis of right femoral vein: Secondary | ICD-10-CM

## 2017-06-10 LAB — POCT INR: INR: 6.7

## 2017-06-10 NOTE — Progress Notes (Signed)
    Pharmacy Anticoagulation Clinic  Subjective: Patient presents today for INR monitoring. Anticoagulation indication is DVT   Current dose of warfarin: 5 mg daily but 1/2 tablet on Mondays and Wednesdays  Adherence to warfarin: denies missed doses. Signs/symptoms of bleeding: denies SOB/leg swelling or redness: denies Recent changes in diet: denies Recent changes in medications: denies Upcoming procedures that may impact anticoagulation: denies   Objective: Today's INR = 6.7  Lab Results  Component Value Date   INR 6.7 06/10/2017   INR 3.9 06/03/2017   INR 1.2 05/29/2017     Assessment and Plan: Anticoagulation: INR currently supratherapeutic. Patient with very labile INR. Hold coumadin. No s/sx of bleeding Will recheck INR on Wednesday. Will restart at 2.5 mg daily when time to restart. Patient planning on going to [place] for extended time. Patient has a coumadin clinic that he can go to per his report. Will see patient again prior to departure (planned for this Saturday)  Patient verbalized understanding and was provided with written instructions. Patient to report to ED or call 911 with any leg swelling, leg erythema, SOB, falls, or s/sx of bleeding.

## 2017-06-11 DIAGNOSIS — D631 Anemia in chronic kidney disease: Secondary | ICD-10-CM | POA: Diagnosis not present

## 2017-06-11 DIAGNOSIS — Z992 Dependence on renal dialysis: Secondary | ICD-10-CM | POA: Diagnosis not present

## 2017-06-11 DIAGNOSIS — N2581 Secondary hyperparathyroidism of renal origin: Secondary | ICD-10-CM | POA: Diagnosis not present

## 2017-06-11 DIAGNOSIS — N186 End stage renal disease: Secondary | ICD-10-CM | POA: Diagnosis not present

## 2017-06-12 ENCOUNTER — Ambulatory Visit: Payer: Medicare Other | Attending: Family Medicine | Admitting: Pharmacist

## 2017-06-12 DIAGNOSIS — I208 Other forms of angina pectoris: Secondary | ICD-10-CM | POA: Diagnosis not present

## 2017-06-12 DIAGNOSIS — Z7901 Long term (current) use of anticoagulants: Secondary | ICD-10-CM | POA: Insufficient documentation

## 2017-06-12 DIAGNOSIS — Z86010 Personal history of colonic polyps: Secondary | ICD-10-CM | POA: Diagnosis not present

## 2017-06-12 DIAGNOSIS — I82411 Acute embolism and thrombosis of right femoral vein: Secondary | ICD-10-CM | POA: Insufficient documentation

## 2017-06-12 DIAGNOSIS — N186 End stage renal disease: Secondary | ICD-10-CM | POA: Diagnosis not present

## 2017-06-12 DIAGNOSIS — D631 Anemia in chronic kidney disease: Secondary | ICD-10-CM | POA: Diagnosis not present

## 2017-06-12 DIAGNOSIS — Z992 Dependence on renal dialysis: Secondary | ICD-10-CM | POA: Diagnosis not present

## 2017-06-12 LAB — POCT INR: INR: 6.2

## 2017-06-12 NOTE — Progress Notes (Signed)
    Pharmacy Anticoagulation Clinic  Subjective: Patient presents today for INR monitoring. Anticoagulation indication is DVT   Current dose of warfarin: hold warfarin  Adherence to warfarin: denies missed doses. Signs/symptoms of bleeding: denies SOB/leg swelling or redness: denies Recent changes in diet: denies Recent changes in medications: denies Upcoming procedures that may impact anticoagulation: denies   Objective: Today's INR = 6.2  Lab Results  Component Value Date   INR 6.2 06/12/2017   INR 6.7 06/10/2017   INR 3.9 06/03/2017     Assessment and Plan: Anticoagulation: INR currently supratherapeutic. Patient with very labile INR. Hold coumadin. No s/sx of bleeding Will recheck INR on Friday. Will restart at 2.5 mg daily when time to restart. Patient planning on going to [place] for extended time starting Saturday. Patient has a coumadin clinic that he can go to per his report. Will see patient again prior to departure (planned for this Saturday)  Patient verbalized understanding and was provided with written instructions. Patient to report to ED or call 911 with any leg swelling, leg erythema, SOB, falls, or s/sx of bleeding.

## 2017-06-13 DIAGNOSIS — Z992 Dependence on renal dialysis: Secondary | ICD-10-CM | POA: Diagnosis not present

## 2017-06-13 DIAGNOSIS — N2581 Secondary hyperparathyroidism of renal origin: Secondary | ICD-10-CM | POA: Diagnosis not present

## 2017-06-13 DIAGNOSIS — N186 End stage renal disease: Secondary | ICD-10-CM | POA: Diagnosis not present

## 2017-06-13 DIAGNOSIS — D631 Anemia in chronic kidney disease: Secondary | ICD-10-CM | POA: Diagnosis not present

## 2017-06-20 DIAGNOSIS — N2581 Secondary hyperparathyroidism of renal origin: Secondary | ICD-10-CM | POA: Diagnosis not present

## 2017-06-20 DIAGNOSIS — Z992 Dependence on renal dialysis: Secondary | ICD-10-CM | POA: Diagnosis not present

## 2017-06-20 DIAGNOSIS — D631 Anemia in chronic kidney disease: Secondary | ICD-10-CM | POA: Diagnosis not present

## 2017-06-20 DIAGNOSIS — N186 End stage renal disease: Secondary | ICD-10-CM | POA: Diagnosis not present

## 2017-06-22 DIAGNOSIS — N186 End stage renal disease: Secondary | ICD-10-CM | POA: Diagnosis not present

## 2017-06-22 DIAGNOSIS — N2581 Secondary hyperparathyroidism of renal origin: Secondary | ICD-10-CM | POA: Diagnosis not present

## 2017-06-22 DIAGNOSIS — D631 Anemia in chronic kidney disease: Secondary | ICD-10-CM | POA: Diagnosis not present

## 2017-06-22 DIAGNOSIS — Z992 Dependence on renal dialysis: Secondary | ICD-10-CM | POA: Diagnosis not present

## 2017-06-25 DIAGNOSIS — N186 End stage renal disease: Secondary | ICD-10-CM | POA: Diagnosis not present

## 2017-06-25 DIAGNOSIS — N2581 Secondary hyperparathyroidism of renal origin: Secondary | ICD-10-CM | POA: Diagnosis not present

## 2017-06-25 DIAGNOSIS — Z992 Dependence on renal dialysis: Secondary | ICD-10-CM | POA: Diagnosis not present

## 2017-06-25 DIAGNOSIS — D631 Anemia in chronic kidney disease: Secondary | ICD-10-CM | POA: Diagnosis not present

## 2017-06-26 ENCOUNTER — Ambulatory Visit: Payer: Medicare Other | Attending: Family Medicine | Admitting: Pharmacist

## 2017-06-26 DIAGNOSIS — I82411 Acute embolism and thrombosis of right femoral vein: Secondary | ICD-10-CM | POA: Diagnosis not present

## 2017-06-26 DIAGNOSIS — Z7689 Persons encountering health services in other specified circumstances: Secondary | ICD-10-CM | POA: Diagnosis not present

## 2017-06-26 DIAGNOSIS — I2699 Other pulmonary embolism without acute cor pulmonale: Secondary | ICD-10-CM

## 2017-06-26 DIAGNOSIS — Z86718 Personal history of other venous thrombosis and embolism: Secondary | ICD-10-CM | POA: Diagnosis not present

## 2017-06-26 DIAGNOSIS — Z7901 Long term (current) use of anticoagulants: Secondary | ICD-10-CM | POA: Insufficient documentation

## 2017-06-26 DIAGNOSIS — I208 Other forms of angina pectoris: Secondary | ICD-10-CM | POA: Diagnosis not present

## 2017-06-26 LAB — POCT INR: INR: 1

## 2017-06-26 MED ORDER — WARFARIN SODIUM 5 MG PO TABS: ORAL_TABLET | ORAL | 2 refills | Status: DC

## 2017-06-26 MED FILL — WARFARIN SODIUM 5 MG TABLET: 5 | 30 days supply | Qty: 30 | Fill #0

## 2017-06-26 NOTE — Progress Notes (Signed)
    Pharmacy Anticoagulation Clinic  Subjective: Patient presents today for INR monitoring. Anticoagulation indication is PE/DVT (July 2018). The projected end date is February 2019.   Current dose of warfarin: none. He was incarcerated last week for 5 days and was not given warfarin while in jail. When he left, his medication is at home and he cannot return home at this time.  SOB/leg swelling or redness, chest pain: denies   Objective: Today's INR = 1.0  Lab Results  Component Value Date   INR 1.0 06/26/2017   INR 6.2 06/12/2017   INR 6.7 06/10/2017     Assessment and Plan: Anticoagulation: INR currently subtherapeutic. Not a candidate for enoxaparin due to dialysis and unable to get heparin due to cost. Will give warfarin 7.5 mg x 1 today and 5 mg x 1 tomorrow and then recheck INR on Friday. Will plan to then start 2.5 mg daily if INR >2. Refill for warfarin sent to pharmacy so he can get it today and start it.   Patient verbalized understanding and was provided with written instructions. Patient to report to ED or call 911 with any leg swelling, leg erythema, SOB, chest pain, falls, or s/sx of bleeding.

## 2017-06-27 DIAGNOSIS — N2581 Secondary hyperparathyroidism of renal origin: Secondary | ICD-10-CM | POA: Diagnosis not present

## 2017-06-27 DIAGNOSIS — Z992 Dependence on renal dialysis: Secondary | ICD-10-CM | POA: Diagnosis not present

## 2017-06-27 DIAGNOSIS — D631 Anemia in chronic kidney disease: Secondary | ICD-10-CM | POA: Diagnosis not present

## 2017-06-27 DIAGNOSIS — N186 End stage renal disease: Secondary | ICD-10-CM | POA: Diagnosis not present

## 2017-06-27 DIAGNOSIS — E1029 Type 1 diabetes mellitus with other diabetic kidney complication: Secondary | ICD-10-CM | POA: Diagnosis not present

## 2017-06-28 ENCOUNTER — Ambulatory Visit: Payer: Medicare Other | Attending: Family Medicine | Admitting: Pharmacist

## 2017-06-28 DIAGNOSIS — I10 Essential (primary) hypertension: Secondary | ICD-10-CM | POA: Diagnosis not present

## 2017-06-28 DIAGNOSIS — K219 Gastro-esophageal reflux disease without esophagitis: Secondary | ICD-10-CM

## 2017-06-28 DIAGNOSIS — I82411 Acute embolism and thrombosis of right femoral vein: Secondary | ICD-10-CM | POA: Diagnosis not present

## 2017-06-28 LAB — POCT INR: INR: 1.2

## 2017-06-28 MED ORDER — GLIPIZIDE 5 MG PO TABS: 5.0000 mg | ORAL_TABLET | Freq: Two times a day (BID) | ORAL | 0 refills | Status: AC

## 2017-06-28 MED ORDER — SENNA 8.6 MG PO TABS: 2.0000 | ORAL_TABLET | Freq: Every evening | ORAL | 0 refills | Status: DC | PRN

## 2017-06-28 MED ORDER — AMLODIPINE BESYLATE 5 MG PO TABS: 5.0000 mg | ORAL_TABLET | Freq: Every day | ORAL | 0 refills | Status: DC

## 2017-06-28 MED ORDER — OMEPRAZOLE 20 MG PO CPDR: 20.0000 mg | DELAYED_RELEASE_CAPSULE | Freq: Every day | ORAL | 0 refills | Status: DC

## 2017-06-28 NOTE — Addendum Note (Signed)
Addended by: Rica Mast on: 06/28/2017 10:46 AM   Modules accepted: Level of Service

## 2017-06-28 NOTE — Progress Notes (Signed)
    Pharmacy Anticoagulation Clinic  Subjective: Patient presents today for INR monitoring. Anticoagulation indication is PE/DVT (July 2018). The projected end date is February 2019.   Current dose of warfarin: 7.5 mg on Wednesday and 5 mg yesterday.Marland Kitchen He was incarcerated last week for 5 days and was not given warfarin while in jail. When he left, his medication is at home and he cannot return home at this time.  SOB/leg swelling or redness, chest pain: denies   Objective: Today's INR = 1.2  Lab Results  Component Value Date   INR 1.2 06/28/2017   INR 1.0 06/26/2017   INR 6.2 06/12/2017     Assessment and Plan: Anticoagulation: INR currently subtherapeutic. Not a candidate for enoxaparin due to dialysis and unable to get heparin due to cost. Will give warfarin 7.5 mgtoday and  tomorrow and then 5 mg daily and recheck on Monday. Will plan to then start 2.5 mg daily if INR >2. Refill for warfarin sent to pharmacy so he can get it today and start it.   Patient has been out of the rest of his medications besides warfarin since he still cannot go home. Refilled those ordered by Dr. Jarold Song.   Patient verbalized understanding and was provided with written instructions. Patient to report to ED or call 911 with any leg swelling, leg erythema, SOB, chest pain, falls, or s/sx of bleeding.

## 2017-06-29 DIAGNOSIS — N2581 Secondary hyperparathyroidism of renal origin: Secondary | ICD-10-CM | POA: Diagnosis not present

## 2017-06-29 DIAGNOSIS — D631 Anemia in chronic kidney disease: Secondary | ICD-10-CM | POA: Diagnosis not present

## 2017-06-29 DIAGNOSIS — N186 End stage renal disease: Secondary | ICD-10-CM | POA: Diagnosis not present

## 2017-06-29 DIAGNOSIS — Z992 Dependence on renal dialysis: Secondary | ICD-10-CM | POA: Diagnosis not present

## 2017-07-01 ENCOUNTER — Ambulatory Visit: Payer: Medicare Other | Attending: Family Medicine | Admitting: Pharmacist

## 2017-07-01 DIAGNOSIS — I82411 Acute embolism and thrombosis of right femoral vein: Secondary | ICD-10-CM | POA: Diagnosis not present

## 2017-07-01 DIAGNOSIS — I208 Other forms of angina pectoris: Secondary | ICD-10-CM | POA: Diagnosis not present

## 2017-07-01 DIAGNOSIS — I2699 Other pulmonary embolism without acute cor pulmonale: Secondary | ICD-10-CM | POA: Diagnosis not present

## 2017-07-01 LAB — POCT INR: INR: 2.9

## 2017-07-02 DIAGNOSIS — N186 End stage renal disease: Secondary | ICD-10-CM | POA: Diagnosis not present

## 2017-07-02 DIAGNOSIS — D631 Anemia in chronic kidney disease: Secondary | ICD-10-CM | POA: Diagnosis not present

## 2017-07-02 DIAGNOSIS — N2581 Secondary hyperparathyroidism of renal origin: Secondary | ICD-10-CM | POA: Diagnosis not present

## 2017-07-02 DIAGNOSIS — Z992 Dependence on renal dialysis: Secondary | ICD-10-CM | POA: Diagnosis not present

## 2017-07-03 DIAGNOSIS — Z992 Dependence on renal dialysis: Secondary | ICD-10-CM | POA: Diagnosis not present

## 2017-07-03 DIAGNOSIS — N186 End stage renal disease: Secondary | ICD-10-CM | POA: Diagnosis not present

## 2017-07-03 DIAGNOSIS — E1122 Type 2 diabetes mellitus with diabetic chronic kidney disease: Secondary | ICD-10-CM | POA: Diagnosis not present

## 2017-07-04 DIAGNOSIS — N186 End stage renal disease: Secondary | ICD-10-CM | POA: Diagnosis not present

## 2017-07-04 DIAGNOSIS — N2581 Secondary hyperparathyroidism of renal origin: Secondary | ICD-10-CM | POA: Diagnosis not present

## 2017-07-04 DIAGNOSIS — E8770 Fluid overload, unspecified: Secondary | ICD-10-CM | POA: Diagnosis not present

## 2017-07-06 DIAGNOSIS — E8770 Fluid overload, unspecified: Secondary | ICD-10-CM | POA: Diagnosis not present

## 2017-07-06 DIAGNOSIS — N2581 Secondary hyperparathyroidism of renal origin: Secondary | ICD-10-CM | POA: Diagnosis not present

## 2017-07-06 DIAGNOSIS — N186 End stage renal disease: Secondary | ICD-10-CM | POA: Diagnosis not present

## 2017-07-08 ENCOUNTER — Encounter: Payer: Self-pay | Admitting: Family Medicine

## 2017-07-09 DIAGNOSIS — N186 End stage renal disease: Secondary | ICD-10-CM | POA: Diagnosis not present

## 2017-07-09 DIAGNOSIS — N2581 Secondary hyperparathyroidism of renal origin: Secondary | ICD-10-CM | POA: Diagnosis not present

## 2017-07-09 DIAGNOSIS — E8770 Fluid overload, unspecified: Secondary | ICD-10-CM | POA: Diagnosis not present

## 2017-07-10 DIAGNOSIS — E8770 Fluid overload, unspecified: Secondary | ICD-10-CM | POA: Diagnosis not present

## 2017-07-10 DIAGNOSIS — N2581 Secondary hyperparathyroidism of renal origin: Secondary | ICD-10-CM | POA: Diagnosis not present

## 2017-07-10 DIAGNOSIS — N186 End stage renal disease: Secondary | ICD-10-CM | POA: Diagnosis not present

## 2017-07-11 DIAGNOSIS — N186 End stage renal disease: Secondary | ICD-10-CM | POA: Diagnosis not present

## 2017-07-11 DIAGNOSIS — N2581 Secondary hyperparathyroidism of renal origin: Secondary | ICD-10-CM | POA: Diagnosis not present

## 2017-07-11 DIAGNOSIS — E8770 Fluid overload, unspecified: Secondary | ICD-10-CM | POA: Diagnosis not present

## 2017-07-13 DIAGNOSIS — N186 End stage renal disease: Secondary | ICD-10-CM | POA: Diagnosis not present

## 2017-07-13 DIAGNOSIS — E8770 Fluid overload, unspecified: Secondary | ICD-10-CM | POA: Diagnosis not present

## 2017-07-13 DIAGNOSIS — N2581 Secondary hyperparathyroidism of renal origin: Secondary | ICD-10-CM | POA: Diagnosis not present

## 2017-07-15 ENCOUNTER — Encounter (HOSPITAL_COMMUNITY): Payer: Self-pay | Admitting: Emergency Medicine

## 2017-07-15 ENCOUNTER — Emergency Department (HOSPITAL_COMMUNITY): Payer: Medicare Other

## 2017-07-15 ENCOUNTER — Ambulatory Visit (INDEPENDENT_AMBULATORY_CARE_PROVIDER_SITE_OTHER): Payer: Medicare Other | Admitting: Podiatry

## 2017-07-15 ENCOUNTER — Other Ambulatory Visit: Payer: Self-pay

## 2017-07-15 ENCOUNTER — Encounter: Payer: Self-pay | Admitting: Podiatry

## 2017-07-15 ENCOUNTER — Telehealth: Payer: Self-pay | Admitting: Family Medicine

## 2017-07-15 ENCOUNTER — Inpatient Hospital Stay (HOSPITAL_COMMUNITY)
Admission: EM | Admit: 2017-07-15 | Discharge: 2017-07-30 | DRG: 356 | Disposition: A | Discharge status: Home / Self Care | Payer: Medicare Other | Attending: Family Medicine | Admitting: Family Medicine

## 2017-07-15 DIAGNOSIS — R112 Nausea with vomiting, unspecified: Secondary | ICD-10-CM | POA: Diagnosis present

## 2017-07-15 DIAGNOSIS — I10 Essential (primary) hypertension: Secondary | ICD-10-CM | POA: Diagnosis not present

## 2017-07-15 DIAGNOSIS — D631 Anemia in chronic kidney disease: Secondary | ICD-10-CM | POA: Diagnosis present

## 2017-07-15 DIAGNOSIS — T82868A Thrombosis of vascular prosthetic devices, implants and grafts, initial encounter: Secondary | ICD-10-CM | POA: Diagnosis not present

## 2017-07-15 DIAGNOSIS — E871 Hypo-osmolality and hyponatremia: Secondary | ICD-10-CM | POA: Diagnosis not present

## 2017-07-15 DIAGNOSIS — Z86711 Personal history of pulmonary embolism: Secondary | ICD-10-CM | POA: Diagnosis not present

## 2017-07-15 DIAGNOSIS — Y832 Surgical operation with anastomosis, bypass or graft as the cause of abnormal reaction of the patient, or of later complication, without mention of misadventure at the time of the procedure: Secondary | ICD-10-CM | POA: Diagnosis present

## 2017-07-15 DIAGNOSIS — Z452 Encounter for adjustment and management of vascular access device: Secondary | ICD-10-CM | POA: Diagnosis not present

## 2017-07-15 DIAGNOSIS — Z95828 Presence of other vascular implants and grafts: Secondary | ICD-10-CM

## 2017-07-15 DIAGNOSIS — Z419 Encounter for procedure for purposes other than remedying health state, unspecified: Secondary | ICD-10-CM

## 2017-07-15 DIAGNOSIS — Z7984 Long term (current) use of oral hypoglycemic drugs: Secondary | ICD-10-CM | POA: Diagnosis not present

## 2017-07-15 DIAGNOSIS — E1151 Type 2 diabetes mellitus with diabetic peripheral angiopathy without gangrene: Secondary | ICD-10-CM | POA: Diagnosis not present

## 2017-07-15 DIAGNOSIS — K625 Hemorrhage of anus and rectum: Secondary | ICD-10-CM | POA: Diagnosis not present

## 2017-07-15 DIAGNOSIS — T82858A Stenosis of vascular prosthetic devices, implants and grafts, initial encounter: Secondary | ICD-10-CM | POA: Diagnosis present

## 2017-07-15 DIAGNOSIS — Z7901 Long term (current) use of anticoagulants: Secondary | ICD-10-CM | POA: Diagnosis not present

## 2017-07-15 DIAGNOSIS — R748 Abnormal levels of other serum enzymes: Secondary | ICD-10-CM | POA: Diagnosis present

## 2017-07-15 DIAGNOSIS — Z79899 Other long term (current) drug therapy: Secondary | ICD-10-CM

## 2017-07-15 DIAGNOSIS — I5032 Chronic diastolic (congestive) heart failure: Secondary | ICD-10-CM | POA: Diagnosis present

## 2017-07-15 DIAGNOSIS — K648 Other hemorrhoids: Secondary | ICD-10-CM | POA: Diagnosis not present

## 2017-07-15 DIAGNOSIS — N186 End stage renal disease: Secondary | ICD-10-CM | POA: Diagnosis present

## 2017-07-15 DIAGNOSIS — D500 Iron deficiency anemia secondary to blood loss (chronic): Secondary | ICD-10-CM | POA: Diagnosis not present

## 2017-07-15 DIAGNOSIS — Z86718 Personal history of other venous thrombosis and embolism: Secondary | ICD-10-CM

## 2017-07-15 DIAGNOSIS — D72829 Elevated white blood cell count, unspecified: Secondary | ICD-10-CM

## 2017-07-15 DIAGNOSIS — K567 Ileus, unspecified: Secondary | ICD-10-CM

## 2017-07-15 DIAGNOSIS — K8020 Calculus of gallbladder without cholecystitis without obstruction: Secondary | ICD-10-CM | POA: Diagnosis not present

## 2017-07-15 DIAGNOSIS — I132 Hypertensive heart and chronic kidney disease with heart failure and with stage 5 chronic kidney disease, or end stage renal disease: Secondary | ICD-10-CM | POA: Diagnosis not present

## 2017-07-15 DIAGNOSIS — K921 Melena: Principal | ICD-10-CM | POA: Diagnosis present

## 2017-07-15 DIAGNOSIS — K635 Polyp of colon: Secondary | ICD-10-CM | POA: Diagnosis not present

## 2017-07-15 DIAGNOSIS — E875 Hyperkalemia: Secondary | ICD-10-CM | POA: Diagnosis not present

## 2017-07-15 DIAGNOSIS — F1490 Cocaine use, unspecified, uncomplicated: Secondary | ICD-10-CM | POA: Diagnosis present

## 2017-07-15 DIAGNOSIS — E1142 Type 2 diabetes mellitus with diabetic polyneuropathy: Secondary | ICD-10-CM | POA: Diagnosis not present

## 2017-07-15 DIAGNOSIS — I120 Hypertensive chronic kidney disease with stage 5 chronic kidney disease or end stage renal disease: Secondary | ICD-10-CM | POA: Diagnosis not present

## 2017-07-15 DIAGNOSIS — E1122 Type 2 diabetes mellitus with diabetic chronic kidney disease: Secondary | ICD-10-CM | POA: Diagnosis present

## 2017-07-15 DIAGNOSIS — R791 Abnormal coagulation profile: Secondary | ICD-10-CM

## 2017-07-15 DIAGNOSIS — N2581 Secondary hyperparathyroidism of renal origin: Secondary | ICD-10-CM | POA: Diagnosis not present

## 2017-07-15 DIAGNOSIS — K920 Hematemesis: Secondary | ICD-10-CM | POA: Diagnosis not present

## 2017-07-15 DIAGNOSIS — Z992 Dependence on renal dialysis: Secondary | ICD-10-CM | POA: Diagnosis not present

## 2017-07-15 DIAGNOSIS — D125 Benign neoplasm of sigmoid colon: Secondary | ICD-10-CM | POA: Diagnosis not present

## 2017-07-15 DIAGNOSIS — K922 Gastrointestinal hemorrhage, unspecified: Secondary | ICD-10-CM | POA: Diagnosis not present

## 2017-07-15 DIAGNOSIS — R1084 Generalized abdominal pain: Secondary | ICD-10-CM | POA: Diagnosis not present

## 2017-07-15 DIAGNOSIS — K649 Unspecified hemorrhoids: Secondary | ICD-10-CM | POA: Diagnosis not present

## 2017-07-15 DIAGNOSIS — E1121 Type 2 diabetes mellitus with diabetic nephropathy: Secondary | ICD-10-CM

## 2017-07-15 DIAGNOSIS — Z4682 Encounter for fitting and adjustment of non-vascular catheter: Secondary | ICD-10-CM | POA: Diagnosis not present

## 2017-07-15 DIAGNOSIS — B351 Tinea unguium: Secondary | ICD-10-CM

## 2017-07-15 DIAGNOSIS — M898X9 Other specified disorders of bone, unspecified site: Secondary | ICD-10-CM | POA: Diagnosis present

## 2017-07-15 LAB — COMPREHENSIVE METABOLIC PANEL
ALK PHOS: 99 U/L (ref 38–126)
ALT: 42 U/L (ref 17–63)
AST: 41 U/L (ref 15–41)
Albumin: 3.7 g/dL (ref 3.5–5.0)
Anion gap: 14 (ref 5–15)
BILIRUBIN TOTAL: 0.9 mg/dL (ref 0.3–1.2)
BUN: 44 mg/dL — AB (ref 6–20)
CALCIUM: 8 mg/dL — AB (ref 8.9–10.3)
CO2: 24 mmol/L (ref 22–32)
CREATININE: 10.56 mg/dL — AB (ref 0.61–1.24)
Chloride: 94 mmol/L — ABNORMAL LOW (ref 101–111)
GFR calc Af Amer: 6 mL/min — ABNORMAL LOW (ref >60)
GFR, EST NON AFRICAN AMERICAN: 5 mL/min — AB (ref >60)
GLUCOSE: 208 mg/dL — AB (ref 65–99)
POTASSIUM: 5.7 mmol/L — AB (ref 3.5–5.1)
Sodium: 132 mmol/L — ABNORMAL LOW (ref 135–145)
TOTAL PROTEIN: 8 g/dL (ref 6.5–8.1)

## 2017-07-15 LAB — CBC
HCT: 34.8 % — ABNORMAL LOW (ref 39.0–52.0)
Hemoglobin: 11.7 g/dL — ABNORMAL LOW (ref 13.0–17.0)
MCH: 33.2 pg (ref 26.0–34.0)
MCHC: 33.6 g/dL (ref 30.0–36.0)
MCV: 98.9 fL (ref 78.0–100.0)
PLATELETS: 361 10*3/uL (ref 150–400)
RBC: 3.52 MIL/uL — ABNORMAL LOW (ref 4.22–5.81)
RDW: 15.4 % (ref 11.5–15.5)
WBC: 10 10*3/uL (ref 4.0–10.5)

## 2017-07-15 LAB — TYPE AND SCREEN
ABO/RH(D): O POS
ANTIBODY SCREEN: NEGATIVE

## 2017-07-15 LAB — PROTIME-INR
INR: 4.13
Prothrombin Time: 39.4 seconds — ABNORMAL HIGH (ref 11.4–15.2)

## 2017-07-15 LAB — POC OCCULT BLOOD, ED: Fecal Occult Bld: POSITIVE — AB

## 2017-07-15 LAB — POTASSIUM: Potassium: 6 mmol/L — ABNORMAL HIGH (ref 3.5–5.1)

## 2017-07-15 MED ORDER — SODIUM CHLORIDE 0.9 % IV SOLN
1.0000 g | Freq: Once | INTRAVENOUS | Status: DC
  Filled 2017-07-15: qty 10

## 2017-07-15 MED ORDER — PANTOPRAZOLE SODIUM 40 MG PO TBEC
40.0000 mg | DELAYED_RELEASE_TABLET | Freq: Every day | ORAL | Status: DC
  Administered 2017-07-15 – 2017-07-30 (×14): 40 mg via ORAL
  Filled 2017-07-15 (×15): qty 1

## 2017-07-15 MED ORDER — SODIUM POLYSTYRENE SULFONATE 15 GM/60ML PO SUSP
30.0000 g | Freq: Once | ORAL | Status: AC
  Administered 2017-07-15: 30 g via ORAL
  Filled 2017-07-15: qty 120

## 2017-07-15 MED ORDER — DEXTROSE 50 % IV SOLN
1.0000 | Freq: Once | INTRAVENOUS | Status: DC
  Filled 2017-07-15: qty 50

## 2017-07-15 MED ORDER — DEXTROSE 5 % IV SOLN
1.0000 mg | Freq: Once | INTRAVENOUS | Status: AC
  Administered 2017-07-15: 1 mg via INTRAVENOUS
  Filled 2017-07-15: qty 0.1

## 2017-07-15 MED ORDER — LANTHANUM CARBONATE 500 MG PO CHEW
1000.0000 mg | CHEWABLE_TABLET | Freq: Three times a day (TID) | ORAL | Status: DC
  Administered 2017-07-16 – 2017-07-29 (×33): 1000 mg via ORAL
  Filled 2017-07-15 (×34): qty 2

## 2017-07-15 MED ORDER — POLYETHYLENE GLYCOL 3350 17 G PO PACK
17.0000 g | PACK | Freq: Every day | ORAL | Status: DC
  Administered 2017-07-15 – 2017-07-30 (×10): 17 g via ORAL
  Filled 2017-07-15 (×12): qty 1

## 2017-07-15 MED ORDER — INSULIN ASPART 100 UNIT/ML ~~LOC~~ SOLN
5.0000 [IU] | Freq: Once | SUBCUTANEOUS | Status: DC
  Filled 2017-07-15: qty 1

## 2017-07-15 MED ORDER — LOSARTAN POTASSIUM 25 MG PO TABS
25.0000 mg | ORAL_TABLET | Freq: Every day | ORAL | Status: DC
  Administered 2017-07-16 – 2017-07-21 (×6): 25 mg via ORAL
  Filled 2017-07-15 (×6): qty 1

## 2017-07-15 MED ORDER — CINACALCET HCL 30 MG PO TABS
60.0000 mg | ORAL_TABLET | Freq: Every day | ORAL | Status: DC
  Administered 2017-07-15 – 2017-07-19 (×5): 60 mg via ORAL
  Filled 2017-07-15 (×5): qty 2

## 2017-07-15 MED ORDER — DEXTROSE 5 % IV SOLN
INTRAVENOUS | Status: DC
  Administered 2017-07-15: 50 mL via INTRAVENOUS

## 2017-07-15 MED ORDER — SENNA 8.6 MG PO TABS
2.0000 | ORAL_TABLET | Freq: Every evening | ORAL | Status: DC | PRN
  Administered 2017-07-21 – 2017-07-29 (×3): 17.2 mg via ORAL
  Filled 2017-07-15 (×3): qty 2

## 2017-07-15 MED ORDER — AMLODIPINE BESYLATE 5 MG PO TABS
5.0000 mg | ORAL_TABLET | Freq: Every day | ORAL | Status: DC
  Administered 2017-07-15 – 2017-07-21 (×7): 5 mg via ORAL
  Filled 2017-07-15 (×7): qty 1
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
## 2017-07-15 NOTE — Telephone Encounter (Signed)
Called patient and instructed him to go to the emergency room immediately or call 911 if he feels like he is unable to drive. Patient verbalized understanding. He reports that he feels ok, just has some blood in his stool over the weekend.   He was supposed to come last week for INR check and did not schedule appt. Patient to follow up with me after ED visit and schedule follow up in Coumadin Clinic.
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
## 2017-07-15 NOTE — ED Notes (Addendum)
Patient transported to CT after iv attempted without success.

## 2017-07-15 NOTE — Telephone Encounter (Signed)
Please fu Patient called and said he is bleeding when he goes to the bathroom in his stole. The stole is a dark red. Please fu

## 2017-07-15 NOTE — Telephone Encounter (Signed)
Noted  

## 2017-07-15 NOTE — Patient Instructions (Signed)

## 2017-07-15 NOTE — ED Notes (Signed)
Per bed placement pt will not be getting a bed assignment tonight. Will provide patient inpt bed for comfort

## 2017-07-15 NOTE — ED Notes (Addendum)
Iv attempted withoput success.PA aware

## 2017-07-15 NOTE — ED Provider Notes (Signed)
[name] is a 56 y.o. male, presenting to the ED with complaint of rectal bleeding.   HPI from [place], PA-C: "[name] is a 56 y.o. male with a pmh of end-stage renal disease, diastolic heart failure, reflux, hypertension, history of cocaine abuse, diabetes, peripheral vascular disease who presents the emergency department chief complaint of rectal bleeding.  Patient is on Coumadin.  2 weeks ago he says that his INR was almost 7.  His Coumadin was held.  It has normalized last week.  He states that over the past 2 days he has had bleeding from his rectum when he wipes.  He has pain in his rectum when he coughs or tries to make a bowel movement.  He is unsure if he may have strained prior to the onset of his bleeding and pain.  He denies abdominal pain.  He denies a history of hemorrhoids.  He does not think he has a tear or fissure.  Patient dialyzes Tuesday Thursday Saturday and is due for dialysis tomorrow."  Past Medical History:  Diagnosis Date  . Anemia   . Cellulitis and abscess of foot 08/24/2011  . Cocaine abuse ([place]) 2016  . Diastolic congestive heart failure ([place])   . ESRD on dialysis ([place])    "TTS; [address]" (10/29/2016)  . GERD (gastroesophageal reflux disease)    unsure  . HCVD (hypertensive cardiovascular disease) 09/2016   Echo shows moderate to severe LAE, grade 1 DD  . Hypertension 2015  . Left atrial mass 04/04/2017  . Pneumonia 10/2016  . Protein calorie malnutrition ([place])   . PVD (peripheral vascular disease) ([place])   . Type II diabetes mellitus ([place]) 2015   ED Course  Procedures  MDM   EKG Interpretation  Date/Time:  Monday July 15 2017 14:45:19 EST Ventricular Rate:  87 PR Interval:  154 QRS Duration: 84 QT Interval:  376 QTC Calculation: 452 R Axis:   43 Text Interpretation:  Normal sinus rhythm Normal ECG Since last EKG, limb lead placement corrected No ischemic changes TW slighly peaked, but intervals normal  Confirmed by Duffy Bruce [phone]) on 07/15/2017 3:46:45 PM       Clinical Course as of Jul 15 1700  Mon Jul 15, 2017  1557 Patient with supratherapeutic INR, T waves are peaked and change from prior EKG.  He has an occult positive stool and blood on rectal examination.  Patient has been given temporizing agents for hyperkalemia and will need dialysis today.  He has a noncontrast CT scan pending.  I have placed a call for admission and have placed a call for nephrology consult.  [AH]  1633 Took patient care handoff report from Margarita Mail, MD.   [SJ]  [phone] Spoke with Dr. Verlon Au, hospitalist, who agrees to admit the patient.   [SJ]  [place] with Dr. Alessandra Bevels, [facility]. States they will consult on the patient and see him in the morning.  [SJ]  [phone] Spoke with Dr. Jonnie Finner, nephrologist. States he is comfortable with the potassium 5.7.  Recommends Kayexalate 30 g in favor of the regimen of calcium, insulin, and D50.  States they will consult on this patient and plan for dialysis in the morning.  [SJ]    Clinical Course User Index [AH] Margarita Mail, PA-C [SJ] Chriselda Leppert, Helane Gunther, PA-C    Patient presents with rectal bleeding.  Supratherapeutic INR.  Admission to be a hospitalist with consults by nephrology and gastroenterology.    Vitals:   07/15/17 1515  07/15/17 1530 07/15/17 1545 07/15/17 1600  BP: (!) 143/94 138/85 136/81 (!) 143/86  Pulse: 90 87 86 85  Resp: (!) 21 16 12 17   Temp:      TempSrc:      SpO2: 96% 98% 99% 94%      Lorayne Bender, PA-C 07/15/17 1702    Valarie Merino, MD 07/16/17 [phone]

## 2017-07-15 NOTE — Progress Notes (Signed)
Patient ID: [name], male   DOB: [birth year], 56 y.o.   MRN: [id number]    Subjective: This patient presents complaining of elongated toenails when walking and wearing shoes and request toenail debridement. Patient is history of fracture proximal phalanx fourth right digit diagnosed in 03/11/2017 and currently wearing surgical shoe. He states the fourth right toe still somewhat uncomfortable, however, improving over time  Objective:  patient appears pleasant orientated 3  Vascular: No peripheral edema noted bilaterally DP and PT pulses 0/4 bilaterally Capillary reflex within normal limits bilaterally  Neurological: Sensation to 10 g monofilament wire intact 4/5 bilaterally Vibratory sensation nonreactive right reactive left Ankle reflex were equal and reactive bilaterally  Dermatological: No open skin lesions bilaterally Dry atrophic skin bilaterally without hair growth The toenails are elongated, hypertrophic, brittle with texture and color changes 6-10  Musculoskeletal: Amputation distal left hallux with well-healed incision Rigid hammertoe second left without any signs of irritation on the second left toe There is no restriction ankle, subtalar, midtarsal joints Myoedema fourth right toe without any open lesions and tenderness to direct palpation at PIPJ  Assessment: Mycotic toenails 9 Diabetic peripheral arterial disease Diabetic peripheral neuropathy Hammertoe second left Reducing symptoms of fracture fourth right toe  Plan: Debridement toenails 6-10 mechanically and electrically without any bleeding Patient instructed wear surgical shoe on right foot until fourth toe was asymptomatic  Reappoint 3 months

## 2017-07-15 NOTE — ED Notes (Signed)
INR reported to [facility].

## 2017-07-15 NOTE — ED Notes (Signed)
Pt placed on inpatient bed for comfort. Pt had moderate size black bowel movement

## 2017-07-15 NOTE — Telephone Encounter (Signed)
I told the patient to go straight to the ED because he stated that he had been bleeding for 3 days in his stole

## 2017-07-15 NOTE — ED Triage Notes (Signed)
Pt to ER for evaluation of GI bleeding onset 3 days ago, states bright red in color, states in stool as well as on toilet paper. On coumadin, dialysis patient, fistula in right arm. Pt in NAD. A/o x4.

## 2017-07-15 NOTE — ED Notes (Signed)
Will hold calcium gluconate, insulin and d50 after speaking with [place], PA. Will get better access, IV.

## 2017-07-16 ENCOUNTER — Other Ambulatory Visit: Payer: Self-pay

## 2017-07-16 ENCOUNTER — Encounter (HOSPITAL_COMMUNITY): Payer: Self-pay | Admitting: *Deleted

## 2017-07-16 LAB — COMPREHENSIVE METABOLIC PANEL
ALT: 34 U/L (ref 17–63)
AST: 30 U/L (ref 15–41)
Albumin: 3.3 g/dL — ABNORMAL LOW (ref 3.5–5.0)
Alkaline Phosphatase: 85 U/L (ref 38–126)
Anion gap: 14 (ref 5–15)
BILIRUBIN TOTAL: 1 mg/dL (ref 0.3–1.2)
BUN: 50 mg/dL — AB (ref 6–20)
CO2: 23 mmol/L (ref 22–32)
Calcium: 7.2 mg/dL — ABNORMAL LOW (ref 8.9–10.3)
Chloride: 95 mmol/L — ABNORMAL LOW (ref 101–111)
Creatinine, Ser: 11.26 mg/dL — ABNORMAL HIGH (ref 0.61–1.24)
GFR, EST AFRICAN AMERICAN: 5 mL/min — AB (ref >60)
GFR, EST NON AFRICAN AMERICAN: 4 mL/min — AB (ref >60)
Glucose, Bld: 90 mg/dL (ref 65–99)
POTASSIUM: 4.6 mmol/L (ref 3.5–5.1)
Sodium: 132 mmol/L — ABNORMAL LOW (ref 135–145)
TOTAL PROTEIN: 7.1 g/dL (ref 6.5–8.1)

## 2017-07-16 LAB — CBC
HEMATOCRIT: 31.9 % — AB (ref 39.0–52.0)
Hemoglobin: 10.8 g/dL — ABNORMAL LOW (ref 13.0–17.0)
MCH: 33 pg (ref 26.0–34.0)
MCHC: 33.9 g/dL (ref 30.0–36.0)
MCV: 97.6 fL (ref 78.0–100.0)
PLATELETS: 346 10*3/uL (ref 150–400)
RBC: 3.27 MIL/uL — ABNORMAL LOW (ref 4.22–5.81)
RDW: 15.2 % (ref 11.5–15.5)
WBC: 10.8 10*3/uL — AB (ref 4.0–10.5)

## 2017-07-16 LAB — MRSA PCR SCREENING: MRSA BY PCR: NEGATIVE

## 2017-07-16 LAB — GLUCOSE, CAPILLARY: Glucose-Capillary: 83 mg/dL (ref 65–99)

## 2017-07-16 MED ORDER — PENTAFLUOROPROP-TETRAFLUOROETH EX AERO: 1.0000 "application " | INHALATION_SPRAY | CUTANEOUS | Status: DC | PRN

## 2017-07-16 MED ORDER — SODIUM CHLORIDE 0.9 % IV SOLN: 100.0000 mL | INTRAVENOUS | Status: DC | PRN

## 2017-07-16 MED ORDER — LIDOCAINE-PRILOCAINE 2.5-2.5 % EX CREA: 1.0000 "application " | TOPICAL_CREAM | CUTANEOUS | Status: DC | PRN

## 2017-07-16 MED ORDER — LIDOCAINE HCL (PF) 1 % IJ SOLN: 5.0000 mL | INTRAMUSCULAR | Status: DC | PRN

## 2017-07-16 NOTE — Progress Notes (Signed)
PROGRESS NOTE    [name]  GLO:[id number] DOB: [birth year] DOA: 07/15/2017 PCP: Arnoldo Morale, MD   Specialists:     Brief Narrative:  56 year old male ESRD [place]?  S/p atherectomy right upper extremity fistula with successful use of the same Diastolic heart failure last EF 04/04/2017 normal RV function EF 55%?  Underwent Lexiscan 10/12/2016 EF 41% no ST segment deviation and it was a low risk study mass right atrium possible dialysis hip with adherent thrombus Cocaine HTN Metabolic bone disease Previous homelessness Elevated troponin without CAD-probably secondary to strain pattern from recent PE Diabetes mellitus type TY 2 Prior Acute PE Right fem DVT  Admit with GI bleed in setting couamidn sporadic use and INR 4 range  GI and nephrology consulted on admit    Assessment & Plan:   Active Problems:   Acute GI bleeding   Acute GI bleed Etiology unclear but could be lower GI versus just use of iron-defer to GI in terms of plan for scope versus not CT scan admission no obvious pathology Hemoglobin is very stable at present time-ranging 10-11 without any further dark stool Recheck a.m. and sounds like had nonbloody but watery stool this morning Discontinue D5 solution  ESRD TTS Hyperkalemia 5.8 on admission treated with Kayexalate Supposed to get dialysis nephrology is seen today Continue with Sensipar 60, Fosrenol 1 g 3 times daily Get phosphorus next check  Diastolic heart failure EF 55% Per Lexiscan 10/21/2016 normal Managed by dialysis, continue losartan 25 daily, amlodipine 5  Recent DVT PE 03/28/2017 d/w dr brambhatt--ok to resume heparin low dose and monitor for bleed  DM ty II keep on full liquid diet Would just monitor sugars every morning and glipizide 5 is on hold  Previous homelessness, cocaine use and recent incarceration Clear plan regarding Coumadin dosing and medication should follow-up with health and  wellness clinic closely and get an INR check in the next week    Low-dose heparin  Consultants:   Gastroenterology  Procedures:   CT scan with  Antimicrobials:   None yet   Subjective: Alert pleasant oriented no distress tolerating diet although liquid asking for solid food No chest pain No fever no chills Only one liquid stool today nonbloody  Objective: Vitals:   07/16/17 0230 07/16/17 0330 07/16/17 0534 07/16/17 0925  BP: 110/60 123/71 (!) 102/54 117/82  Pulse: 89 87 89 81  Resp: 14 19 14 17   Temp:  98.4 F (36.9 C) 98.5 F (36.9 C) 98.1 F (36.7 C)  TempSrc:    Oral  SpO2: 94% 97% 96% 100%  Weight:  96.6 kg (213 lb)    Height:  5\' 11"  (1.803 m)      Intake/Output Summary (Last 24 hours) at 07/16/2017 1357 Last data filed at 07/16/2017 1321 Gross per 24 hour  Intake 974.33 ml  Output 200 ml  Net 774.33 ml   Filed Weights   07/16/17 0330  Weight: 96.6 kg (213 lb)    Examination:  Obese pleasant EOMI NCAT abdomen soft nontender no rebound no guarding chest clear no lower extremity edema illogically intact moving all 4 limbs no abdominal pain   Data Reviewed: I have personally reviewed following labs and imaging studies  CBC: Recent Labs  Lab 07/15/17 1127 07/16/17 0333  WBC 10.0 10.8*  HGB 11.7* 10.8*  HCT 34.8* 31.9*  MCV 98.9 97.6  PLT 361 188   Basic Metabolic Panel: Recent Labs  Lab 07/15/17 1127 07/15/17 1735 07/16/17 4166  NA 132*  --  132*  K 5.7* 6.0* 4.6  CL 94*  --  95*  CO2 24  --  23  GLUCOSE 208*  --  90  BUN 44*  --  50*  CREATININE 10.56*  --  11.26*  CALCIUM 8.0*  --  7.2*   GFR: Estimated Creatinine Clearance: 8.7 mL/min (A) (by C-G formula based on SCr of 11.26 mg/dL (H)). Liver Function Tests: Recent Labs  Lab 07/15/17 1127 07/16/17 0333  AST 41 30  ALT 42 34  ALKPHOS 99 85  BILITOT 0.9 1.0  PROT 8.0 7.1  ALBUMIN 3.7 3.3*   No results for input(s): LIPASE, AMYLASE in the last 168 hours. No results  for input(s): AMMONIA in the last 168 hours. Coagulation Profile: Recent Labs  Lab 07/15/17 1356  INR 4.13*   Cardiac Enzymes: No results for input(s): CKTOTAL, CKMB, CKMBINDEX, TROPONINI in the last 168 hours. BNP (last 3 results) No results for input(s): PROBNP in the last 8760 hours. HbA1C: No results for input(s): HGBA1C in the last 72 hours. CBG: Recent Labs  Lab 07/16/17 0329  GLUCAP 83   Lipid Profile: No results for input(s): CHOL, HDL, LDLCALC, TRIG, CHOLHDL, LDLDIRECT in the last 72 hours. Thyroid Function Tests: No results for input(s): TSH, T4TOTAL, FREET4, T3FREE, THYROIDAB in the last 72 hours. Anemia Panel: No results for input(s): VITAMINB12, FOLATE, FERRITIN, TIBC, IRON, RETICCTPCT in the last 72 hours. Urine analysis:    Component Value Date/Time   COLORURINE YELLOW 03/14/2015 2131   APPEARANCEUR CLEAR 03/14/2015 2131   LABSPEC 1.011 03/14/2015 2131   PHURINE 5.5 03/14/2015 2131   GLUCOSEU 500 (A) 03/14/2015 2131   HGBUR SMALL (A) 03/14/2015 2131   BILIRUBINUR NEGATIVE 03/14/2015 2131   KETONESUR NEGATIVE 03/14/2015 2131   PROTEINUR >300 (A) 03/14/2015 2131   UROBILINOGEN 0.2 03/14/2015 2131   NITRITE NEGATIVE 03/14/2015 2131   LEUKOCYTESUR NEGATIVE 03/14/2015 2131     Radiology Studies: Reviewed images personally in health database    Scheduled Meds: . amLODipine  5 mg Oral Daily  . cinacalcet  60 mg Oral Daily  . lanthanum  1,000 mg Oral TID WC  . losartan  25 mg Oral QHS  . pantoprazole  40 mg Oral Daily  . polyethylene glycol  17 g Oral Daily   Continuous Infusions: . dextrose 50 mL/hr at 07/16/17 0500     LOS: 1 day    Time spent: [place], MD [facility]   If 7PM-7AM, please contact night-coverage [email or web address] Password [email or web address] 07/16/2017, 1:57 PM
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
## 2017-07-17 DIAGNOSIS — N186 End stage renal disease: Secondary | ICD-10-CM

## 2017-07-17 DIAGNOSIS — R791 Abnormal coagulation profile: Secondary | ICD-10-CM

## 2017-07-17 DIAGNOSIS — I10 Essential (primary) hypertension: Secondary | ICD-10-CM

## 2017-07-17 DIAGNOSIS — Z992 Dependence on renal dialysis: Secondary | ICD-10-CM

## 2017-07-17 DIAGNOSIS — K922 Gastrointestinal hemorrhage, unspecified: Secondary | ICD-10-CM

## 2017-07-17 LAB — CBC
HEMATOCRIT: 34.1 % — AB (ref 39.0–52.0)
HEMOGLOBIN: 11.5 g/dL — AB (ref 13.0–17.0)
MCH: 33 pg (ref 26.0–34.0)
MCHC: 33.7 g/dL (ref 30.0–36.0)
MCV: 98 fL (ref 78.0–100.0)
Platelets: 367 10*3/uL (ref 150–400)
RBC: 3.48 MIL/uL — AB (ref 4.22–5.81)
RDW: 15.1 % (ref 11.5–15.5)
WBC: 9.7 10*3/uL (ref 4.0–10.5)

## 2017-07-17 LAB — HEPARIN LEVEL (UNFRACTIONATED)

## 2017-07-17 LAB — PROTIME-INR
INR: 1.7
Prothrombin Time: 19.8 seconds — ABNORMAL HIGH (ref 11.4–15.2)

## 2017-07-17 LAB — GLUCOSE, CAPILLARY
GLUCOSE-CAPILLARY: 200 mg/dL — AB (ref 65–99)
GLUCOSE-CAPILLARY: 195 mg/dL — AB (ref 65–99)

## 2017-07-17 MED ORDER — WARFARIN - PHARMACIST DOSING INPATIENT
Freq: Every day | Status: DC
  Administered 2017-07-17 – 2017-07-18 (×2)

## 2017-07-17 MED ORDER — WARFARIN SODIUM 5 MG PO TABS
5.0000 mg | ORAL_TABLET | Freq: Once | ORAL | Status: AC
  Administered 2017-07-17: 5 mg via ORAL
  Filled 2017-07-17: qty 1

## 2017-07-17 MED ORDER — HEPARIN (PORCINE) IN NACL 100-0.45 UNIT/ML-% IJ SOLN
1200.0000 [IU]/h | INTRAMUSCULAR | Status: DC
Start: 2017-07-17 — End: 2017-07-17
  Administered 2017-07-17: 1200 [IU]/h via INTRAVENOUS
  Filled 2017-07-17: qty 250

## 2017-07-17 MED ORDER — HEPARIN (PORCINE) IN NACL 100-0.45 UNIT/ML-% IJ SOLN
1450.0000 [IU]/h | INTRAMUSCULAR | Status: DC
  Administered 2017-07-17 – 2017-07-18 (×2): 1450 [IU]/h via INTRAVENOUS
  Filled 2017-07-17: qty 250

## 2017-07-17 MED ORDER — HEPARIN BOLUS VIA INFUSION
1500.0000 [IU] | Freq: Once | INTRAVENOUS | Status: AC
  Administered 2017-07-17: 1500 [IU] via INTRAVENOUS
  Filled 2017-07-17: qty 1500

## 2017-07-17 NOTE — Consult Note (Addendum)
   [facility] CM Inpatient Consult   [id number]/79/4327  KARON HECKENDORN [birth year] [id number]      Went to bedside to speak with [name] about [place] Management re-engagement.   He is agreeable and [facility] Care Management written consent obtained.  Confirmed Primary Care MD is Dr. Jarold Song. Uses [facility] for transportation to appointments. Goes to [facility] on [address] on Tuesdays, Thursdays, Saturdays. Denies concerns with obtaining medications.  Uses [facility].  Reports he lives with a friend at address [address] [place], [place], [place] [address]. Reports he recently broke up with his fiance.  Discussed [facility] Care Management follow for DM management. Although he denies having DM. Latest Hgb A1c is 7.8.  Confirmed best contact number as [phone].  Will refer for [place] for transition of care. At time of bedside visit, patient's unplanned readmission score is 36%- medium.  Patient has history of HTN, DMT2, diastolic HF, GERD, PVD, pulmonary embolism/R femoral vein DVT on coumadin, and ESRD on HD on Tuesdays, Thursday, and Saturdays.    Marthenia Rolling, MSN-Ed, RN,BSN [facility] Liaison [phone]

## 2017-07-17 NOTE — Progress Notes (Signed)
[facility] Progress Note  [name] 56 y.o. 10-05-1960  CC:  GI bleed   Subjective: Patient denied any further bleeding episodes. Having brown color stool now. Denied abdominal pain, nausea or vomiting. Feeling hungry  ROS : Negative for chest pain and shortness of breath   Objective: Vital signs in last 24 hours: Vitals:   07/17/17 0443 07/17/17 0938  BP: 106/65 101/65  Pulse: 88 85  Resp: 18 18  Temp: 98.6 F (37 C) 98.4 F (36.9 C)  SpO2: 96% 100%    Physical Exam:  General:  Alert, cooperative, no distress, appears stated age  Head:  Normocephalic, without obvious abnormality, atraumatic  Eyes:  , EOM's intact,   Lungs:   Clear to auscultation bilaterally, respirations unlabored  Heart:  Regular rate and rhythm, S1, S2 normal  Abdomen:   Soft, non-tender, bowel sounds active all four quadrants,  no masses,   Extremities: Extremities normal, atraumatic, no  edema  Pulses: 2+ and symmetric    Lab Results: Recent Labs    07/15/17 1127 07/15/17 1735 07/16/17 0333  NA 132*  --  132*  K 5.7* 6.0* 4.6  CL 94*  --  95*  CO2 24  --  23  GLUCOSE 208*  --  90  BUN 44*  --  50*  CREATININE 10.56*  --  11.26*  CALCIUM 8.0*  --  7.2*   Recent Labs    07/15/17 1127 07/16/17 0333  AST 41 30  ALT 42 34  ALKPHOS 99 85  BILITOT 0.9 1.0  PROT 8.0 7.1  ALBUMIN 3.7 3.3*   Recent Labs    07/16/17 0333 07/17/17 0423  WBC 10.8* 9.7  HGB 10.8* 11.5*  HCT 31.9* 34.1*  MCV 97.6 98.0  PLT 346 367   Recent Labs    07/15/17 1356 07/17/17 0423  LABPROT 39.4* 19.8*  INR 4.13* 1.70      Assessment/Plan: - Rectal bleeding in setting of elevated INR at 4.13. Resolved now. He is having normal-colored bowel movement this morning. - Dark stool. ?? Could be from iron supplements. - Chronic anemia -anticoagulation use with  Coumadin. - End-stage renal disease. On hemodialysis.  Recommendations ------------------------- -  bleeding is  resolved now. Hemoglobin stable.  - Okay to resume anticoagulation from GI standpoint. Risk of recurrent bleeding discussed with the patient. He verbalized understanding. Discussed with primary attending Dr. Algis Liming. He may need bridging with her pain because his INR was reversed with vitamin K. - Soft diet, advance as tolerated. - May need outpatient EGD once safe to hold anticoagulation. - follow up  in GI clinic in 4 weeks. - GI will sign off. Call us back if needed      Otis Brace MD, [place] 07/17/2017, 12:43 PM  Contact #  [phone]

## 2017-07-17 NOTE — Progress Notes (Addendum)
ANTICOAGULATION CONSULT NOTE - Initial Consult  Pharmacy Consult for heparin + warfarin Indication: possible PE and DVT hx  Allergies  Allergen Reactions  . No Known Allergies     Patient Measurements: Height: 5\' 11"  (180.3 cm) Weight: 208 lb 8.9 oz (94.6 kg) IBW/kg (Calculated) : 75.3 Heparin Dosing Weight: 94kg  Vital Signs: Temp: 98.4 F (36.9 C) (11/14 0938) Temp Source: Oral (11/14 0938) BP: 101/65 (11/14 0938) Pulse Rate: 85 (11/14 0938)  Labs: Recent Labs    07/15/17 1127 07/15/17 1356 07/16/17 0333 07/17/17 0423  HGB 11.7*  --  10.8* 11.5*  HCT 34.8*  --  31.9* 34.1*  PLT 361  --  346 367  LABPROT  --  39.4*  --  19.8*  INR  --  4.13*  --  1.70  CREATININE 10.56*  --  11.26*  --     Estimated Creatinine Clearance: 8.6 mL/min (A) (by C-G formula based on SCr of 11.26 mg/dL (H)).   Medical History: Past Medical History:  Diagnosis Date  . Anemia   . Cellulitis and abscess of foot 08/24/2011  . Cocaine abuse ([place]) 2016  . Diastolic congestive heart failure ([place])   . ESRD on dialysis ([place])    "TTS; [address]" (10/29/2016)  . GERD (gastroesophageal reflux disease)    unsure  . HCVD (hypertensive cardiovascular disease) 09/2016   Echo shows moderate to severe LAE, grade 1 DD  . Hypertension 2015  . Left atrial mass 04/04/2017  . Pneumonia 10/2016  . Protein calorie malnutrition ([place])   . PVD (peripheral vascular disease) ([place])   . Type II diabetes mellitus ([place]) 2015     Assessment: 27 YOM admitted with GIB. On warfarin PTA (home dose 5mg  daily)- held upon admission. Admit INR elevated at 4.13- patient received vitamin K 1mg  IV x1 on 11/12 PM. INR today down to 1.7. GI is not planning for EGD or colonoscopy.  Hgb 11.5, plts 367.   Goal of Therapy:  INR 2-3 Heparin level 0.3-0.7 units/ml Monitor platelets by anticoagulation protocol: Yes   Plan:  Start heparin with NO BOLUS at rate of 1200 units/hr Heparin level in 8h Warfarin 5mg  po x1  tonight Daily heparin level, CBC, and INR  Shaquoia Miers D. Mckynlee Luse, PharmD, [place] Clinical Pharmacist Clinical Phone for 07/17/2017 until 3:30pm: [phone] If after 3:30pm, please call main pharmacy at [phone] 07/17/2017 12:09 PM

## 2017-07-17 NOTE — Progress Notes (Signed)
ANTICOAGULATION CONSULT NOTE -Follow up Pharmacy Consult for heparin + warfarin Indication:  h/o DVT/PE (04/02/17)    Allergies  Allergen Reactions  . No Known Allergies     Patient Measurements: Height: 5\' 11"  (180.3 cm) Weight: 212 lb 11.2 oz (96.5 kg) IBW/kg (Calculated) : 75.3 Heparin Dosing Weight: 94kg  Vital Signs: Temp: 98.1 F (36.7 C) (11/14 2144) Temp Source: Oral (11/14 2144) BP: 122/86 (11/14 2144) Pulse Rate: 95 (11/14 2144)  Labs: Recent Labs    07/15/17 1127 07/15/17 1356 07/16/17 0333 07/17/17 0423 07/17/17 2104  HGB 11.7*  --  10.8* 11.5*  --   HCT 34.8*  --  31.9* 34.1*  --   PLT 361  --  346 367  --   LABPROT  --  39.4*  --  19.8*  --   INR  --  4.13*  --  1.70  --   HEPARINUNFRC  --   --   --   --  <0.10*  CREATININE 10.56*  --  11.26*  --   --     Estimated Creatinine Clearance: 8.7 mL/min (A) (by C-G formula based on SCr of 11.26 mg/dL (H)).   Medical History: Past Medical History:  Diagnosis Date  . Anemia   . Cellulitis and abscess of foot 08/24/2011  . Cocaine abuse ([place]) 2016  . Diastolic congestive heart failure ([place])   . ESRD on dialysis ([place])    "TTS; [address]" (10/29/2016)  . GERD (gastroesophageal reflux disease)    unsure  . HCVD (hypertensive cardiovascular disease) 09/2016   Echo shows moderate to severe LAE, grade 1 DD  . Hypertension 2015  . Left atrial mass 04/04/2017  . Pneumonia 10/2016  . Protein calorie malnutrition ([place])   . PVD (peripheral vascular disease) ([place])   . Type II diabetes mellitus ([place]) 2015     Assessment: 51 YOM admitted with GIB. On warfarin PTA (home dose 5mg  daily)- held upon admission. Heparin bridge and warfarin for h/o RLE DVT/PE (04/02/17).   Admit INR elevated at 4.13- patient received vitamin K 1mg  IV x1 on 11/12 PM. INR today down to 1.7, subtherapeutic IN. GI is not planning for EGD or colonoscopy thus IV heparin started today due to subtherapeutic;  Rectal bleeding has resolved per  MD's report. [facility] noted, dark stools that he had could be from iron supplements.  Hgb  Up to 11.5, plts 367.  ~ 8 hour heparin level is < 0.1, undetectable on heparin drip 1200 units/hr (12.7 units/kg/hr).   No bleeding reported per RN.  RN is going to move heparin infusion to a 2nd IV site since pt complaining current heparin IV site is hurting and Heparin level is undetectable.     Goal of Therapy:  INR 2-3 Heparin level 0.3-0.7 units/ml Monitor platelets by anticoagulation protocol: Yes   Plan:  Small bolus 1500 heparin IV x1 Increase IV heparin drip to 1450 units/hr  Heparin level in ~ 6h Daily heparin level, CBC, and INR  Nicole Cella, RPh Clinical Pharmacist 8a-330p [phone] 330p-1030p phone [phone] or [place] 07/17/2017 10:01 PM

## 2017-07-17 NOTE — Progress Notes (Signed)
[name] Progress Note   Subjective: Awake, Alert, No C/Os. No bloody stools reported over night. Said he had BM this AM which appeared normal. Ready to go home.     Objective Vitals:   07/16/17 1850 07/16/17 2145 07/17/17 0443 07/17/17 0938  BP: 124/68 115/80 106/65 101/65  Pulse: 100 (!) 105 88 85  Resp: 17  18 18   Temp: (!) 97.5 F (36.4 C) 98.8 F (37.1 C) 98.6 F (37 C) 98.4 F (36.9 C)  TempSrc: Oral Oral  Oral  SpO2: 95% 97% 96% 100%  Weight: 94.6 kg (208 lb 8.9 oz) 94.6 kg (208 lb 8.9 oz)    Height:       Physical Exam General: Pleasant, alert, NAD Heart: S1,S2 No M/G/R Lungs: CTAB A/P Abdomen: SNT Extremities: No LE edema Dialysis Access: RUA AVF + bruit   Additional Objective Labs: Basic Metabolic Panel: Recent Labs  Lab 07/15/17 1127 07/15/17 1735 07/16/17 0333  NA 132*  --  132*  K 5.7* 6.0* 4.6  CL 94*  --  95*  CO2 24  --  23  GLUCOSE 208*  --  90  BUN 44*  --  50*  CREATININE 10.56*  --  11.26*  CALCIUM 8.0*  --  7.2*   Liver Function Tests: Recent Labs  Lab 07/15/17 1127 07/16/17 0333  AST 41 30  ALT 42 34  ALKPHOS 99 85  BILITOT 0.9 1.0  PROT 8.0 7.1  ALBUMIN 3.7 3.3*   No results for input(s): LIPASE, AMYLASE in the last 168 hours. CBC: Recent Labs  Lab 07/15/17 1127 07/16/17 0333 07/17/17 0423  WBC 10.0 10.8* 9.7  HGB 11.7* 10.8* 11.5*  HCT 34.8* 31.9* 34.1*  MCV 98.9 97.6 98.0  PLT 361 346 367   Blood Culture    Component Value Date/Time   SDES BLOOD LEFT ANTECUBITAL 09/29/2016 0950   SPECREQUEST BOTTLES DRAWN AEROBIC AND ANAEROBIC  5CC 09/29/2016 0950   CULT NO GROWTH 5 DAYS 09/29/2016 0950   REPTSTATUS 10/04/2016 FINAL 09/29/2016 0950    Cardiac Enzymes: No results for input(s): CKTOTAL, CKMB, CKMBINDEX, TROPONINI in the last 168 hours. CBG: Recent Labs  Lab 07/16/17 0329  GLUCAP 83   Iron Studies: No results for input(s): IRON, TIBC, TRANSFERRIN, FERRITIN in the last 72  hours. @lablastinr3 @ Studies/Results: Ct Abdomen Pelvis Wo Contrast  Result Date: 07/15/2017 CLINICAL DATA:  56 year old hypertensive diabetic male on dialysis presenting with bright red blood per rectum. On Coumadin. Prior appendectomy. Initial encounter. EXAM: CT ABDOMEN AND PELVIS WITHOUT CONTRAST TECHNIQUE: Multidetector CT imaging of the abdomen and pelvis was performed following the standard protocol without IV contrast. COMPARISON:  None. FINDINGS: Lower chest: No worrisome lung base abnormality. Coronary artery calcifications. Heart size within normal limits. Hepatobiliary: Minimally lobulated contour without other findings of cirrhosis. Taking into account limitation by non contrast imaging, no worrisome mass. Prominent size gallbladder or possibly with sludge but without calcified gallstone. Pancreas: Taking into account limitation by non contrast imaging, no pancreatic mass or inflammation Spleen: Taking into account limitation by non contrast imaging, no mass or enlargement Adrenals/Urinary Tract: No obstructing stone or hydronephrosis. Taking into account limitation by non contrast imaging, no renal or adrenal mass. Noncontrast filled urinary bladder without abnormality. Stomach/Bowel: Portions of colon under distended. No extraluminal bowel inflammatory process, free fluid or free air. Vascular/Lymphatic: Aortic calcification without aneurysm. Aortic branch vessel calcifications. Scattered normal size lymph nodes including mild porta hepatis lymph nodes and pelvic lymph nodes without adenopathy. Reproductive:  Coarse calcifications prostate gland. Partial calcification seminal vesicles with asymmetric enlargement right seminal vesicle of indeterminate etiology. Other: Fat and vessel containing inguinal hernias. Musculoskeletal: Degenerative changes lumbar spine most notable L2-3 through L5-S1. No osseous destructive lesion. IMPRESSION: Evaluation of segments of bowel limited by under distension  however, no extraluminal bowel inflammatory process, free fluid or free air. Aortic Atherosclerosis (ICD10-I70.0). Coronary artery calcifications. Enlarged right seminal vesicle of indeterminate etiology. Fat and vessel containing inguinal hernias. Degenerative changes L2-3 through L5-S1. Prominent size gallbladder without calcified gallstones. Atrophic kidneys. Electronically Signed   By: Genia Del M.D.   On: 07/15/2017 16:24   Medications:  . amLODipine  5 mg Oral Daily  . cinacalcet  60 mg Oral Daily  . lanthanum  1,000 mg Oral TID WC  . losartan  25 mg Oral QHS  . pantoprazole  40 mg Oral Daily  . polyethylene glycol  17 g Oral Daily     Dialysis Orders: T,Th,S [facility] 4 hrs 180 NRe 400/800 92 kg 2.0 K/ 2.5 Ca  -Heparin 6000 units IV TIW -Hectorol 1 mcg IV TIW -No ESA/Venofer  BMD meds:  -Auryxia 210 mg 2 tabs TID A/C -Sensipar 60 mg PO q evening  Assessment/Plan: 1.  GIB: unclear origin. GI consulted. Conservative treatment for now. Supra-therapeutic INR on adm. Rec'd Vit K. Coumadin on hold. Per primary. HGB ^ 11.5 This AM. No bloody stools this AM.  2. H/O DVT/PE: Coumadin on hold per primary  3.  ESRD -  T,Th,S Next HD 07/18/17.  4.  Hypertension/volume  - BP well controlled. Continue Losartan 25 mg PO q day, amlodipine 5 mg PO daily. No evidence of volume overload. HD yesterday. Pre wt 98.6 kg Net UF 4.0 Post wt 94.6 kg. Remains 2.6 over OP EDW. May need to raised. Attempt 2-3 liters tomorrow.  5.  Anemia  - HGB 10.8. No ESA ordered as OP, none needed now. Follow HGB.  6.  Metabolic bone disease -  Continue binders/VDRA when able to eat. Add Renal function to today's labs. 7.  Nutrition - Full liquid diet at present. Albumin 3.7 8.  DM: Per primary.   Disposition: Stable from nephrology stand point for DC.    Rita H. Brown NP-C 07/17/2017, 9:56 AM  [facility] [phone]  Pt seen, examined and agree w A/P as above.  Kelly Splinter MD [employer] pager [phone]   07/17/2017, 2:23 PM

## 2017-07-17 NOTE — Progress Notes (Signed)
PROGRESS NOTE   [name]  ZDG:[id number]    DOB: [birth year]    DOA: 07/15/2017  PCP: Arnoldo Morale, MD   I have briefly reviewed patients previous [facility].  Brief Narrative:  56 year old male with PMH of ESRD on TTS HD, HTN, DM 2, chronic diastolic CHF, GERD, PAD, PE/right femoral DVT 04/02/17 on Coumadin managed at [facility], presented to ED on 07/15/17 with rectal bleeding, INR 4.1 and hemoglobin 11.7. Supratherapeutic INR was reversed with vitamin K. Rectal bleeding has resolved. GI was consulted and recommend conservative management. Nephrology consulted for dialysis needs. Resume Coumadin with IV heparin bridging. DC home when INR therapeutic >2.   Assessment & Plan:   Active Problems:   Acute GI bleeding   Acute lower GI bleeding In the setting of elevated INR 4.13. This was reversed with doses of vitamin K. Rectal bleeding has resolved. As per [facility], dark stools that he had could be from iron supplements. Discussed with Dr. Alessandra Bevels, GI and okay to resume Coumadin with IV heparin bridge. May need outpatient EGD once safe to hold anticoagulation. GI will arrange follow-up in 4 weeks.  History of DVT/PE 04/02/17 As discussed above started Coumadin and IV heparin bridging. Discussed with pharmacy and low molecular weight heparin not ideal for bridging in dialysis patients.  ESRD on TTS HD Nephrology following. Next HD on 11/15. Hyperkalemia resolved.  Essential hypertension Controlled on losartan and amlodipine. No clinical volume overload but nephrology indicates that he is 2.6 kg over and plan to manage volume across dialysis.  Anemia due to chronic kidney disease  Stable.  Type II DM with renal complications - Oral hypoglycemics held. Monitor CBGs and SSI as needed.     DVT prophylaxis: IV heparin bridging and Coumadin per pharmacy  Code Status: Full  Family Communication: None at bedside    Disposition: DC home when INR therapeutic >2.  Consultants:  Nephrology [facility]   Procedures:  None  Antimicrobials:  None    Subjective: Reports no rectal bleeding since yesterday. Had normal brown colored stools this morning without blood. Asking to advanced diet. No abdominal pain.   ROS: No chest pain, dyspnea, palpitations or dizziness reported.   Objective:  Vitals:   07/16/17 1850 07/16/17 2145 07/17/17 0443 07/17/17 0938  BP: 124/68 115/80 106/65 101/65  Pulse: 100 (!) 105 88 85  Resp: 17  18 18   Temp: (!) 97.5 F (36.4 C) 98.8 F (37.1 C) 98.6 F (37 C) 98.4 F (36.9 C)  TempSrc: Oral Oral  Oral  SpO2: 95% 97% 96% 100%  Weight: 94.6 kg (208 lb 8.9 oz) 94.6 kg (208 lb 8.9 oz)    Height:        Examination:  General exam: Pleasant middle-aged male sitting up comfortably in bed.  Respiratory system: Clear to auscultation. Respiratory effort normal. Cardiovascular system: S1 & S2 heard, RRR. No JVD, murmurs, rubs, gallops or clicks. No pedal edema.Telemetry: Sinus rhythm.  Gastrointestinal system: Abdomen is nondistended, soft and nontender. No organomegaly or masses felt. Normal bowel sounds heard. Central nervous system: Alert and oriented. No focal neurological deficits. Extremities: Symmetric 5 x 5 power. Skin: No rashes, lesions or ulcers Psychiatry: Judgement and insight appear normal. Mood & affect appropriate.     Data Reviewed: I have personally reviewed following labs and imaging studies  CBC: Recent Labs  Lab 07/15/17 1127 07/16/17 0333 07/17/17 0423  WBC 10.0 10.8* 9.7  HGB 11.7*  10.8* 11.5*  HCT 34.8* 31.9* 34.1*  MCV 98.9 97.6 98.0  PLT 361 346 761   Basic Metabolic Panel: Recent Labs  Lab 07/15/17 1127 07/15/17 1735 07/16/17 0333  NA 132*  --  132*  K 5.7* 6.0* 4.6  CL 94*  --  95*  CO2 24  --  23  GLUCOSE 208*  --  90  BUN 44*  --  50*  CREATININE 10.56*  --  11.26*  CALCIUM 8.0*  --  7.2*   Liver Function  Tests: Recent Labs  Lab 07/15/17 1127 07/16/17 0333  AST 41 30  ALT 42 34  ALKPHOS 99 85  BILITOT 0.9 1.0  PROT 8.0 7.1  ALBUMIN 3.7 3.3*   Coagulation Profile: Recent Labs  Lab 07/15/17 1356 07/17/17 0423  INR 4.13* 1.70   CBG: Recent Labs  Lab 07/16/17 0329  GLUCAP 83    Recent Results (from the past 240 hour(s))  MRSA PCR Screening     Status: None   Collection Time: 07/16/17  3:54 AM  Result Value Ref Range Status   MRSA by PCR NEGATIVE NEGATIVE Final    Comment:        The GeneXpert MRSA Assay (FDA approved for NASAL specimens only), is one component of a comprehensive MRSA colonization surveillance program. It is not intended to diagnose MRSA infection nor to guide or monitor treatment for MRSA infections.          Radiology Studies: Ct Abdomen Pelvis Wo Contrast  Result Date: 07/15/2017 CLINICAL DATA:  56 year old hypertensive diabetic male on dialysis presenting with bright red blood per rectum. On Coumadin. Prior appendectomy. Initial encounter. EXAM: CT ABDOMEN AND PELVIS WITHOUT CONTRAST TECHNIQUE: Multidetector CT imaging of the abdomen and pelvis was performed following the standard protocol without IV contrast. COMPARISON:  None. FINDINGS: Lower chest: No worrisome lung base abnormality. Coronary artery calcifications. Heart size within normal limits. Hepatobiliary: Minimally lobulated contour without other findings of cirrhosis. Taking into account limitation by non contrast imaging, no worrisome mass. Prominent size gallbladder or possibly with sludge but without calcified gallstone. Pancreas: Taking into account limitation by non contrast imaging, no pancreatic mass or inflammation Spleen: Taking into account limitation by non contrast imaging, no mass or enlargement Adrenals/Urinary Tract: No obstructing stone or hydronephrosis. Taking into account limitation by non contrast imaging, no renal or adrenal mass. Noncontrast filled urinary bladder  without abnormality. Stomach/Bowel: Portions of colon under distended. No extraluminal bowel inflammatory process, free fluid or free air. Vascular/Lymphatic: Aortic calcification without aneurysm. Aortic branch vessel calcifications. Scattered normal size lymph nodes including mild porta hepatis lymph nodes and pelvic lymph nodes without adenopathy. Reproductive: Coarse calcifications prostate gland. Partial calcification seminal vesicles with asymmetric enlargement right seminal vesicle of indeterminate etiology. Other: Fat and vessel containing inguinal hernias. Musculoskeletal: Degenerative changes lumbar spine most notable L2-3 through L5-S1. No osseous destructive lesion. IMPRESSION: Evaluation of segments of bowel limited by under distension however, no extraluminal bowel inflammatory process, free fluid or free air. Aortic Atherosclerosis (ICD10-I70.0). Coronary artery calcifications. Enlarged right seminal vesicle of indeterminate etiology. Fat and vessel containing inguinal hernias. Degenerative changes L2-3 through L5-S1. Prominent size gallbladder without calcified gallstones. Atrophic kidneys. Electronically Signed   By: Genia Del M.D.   On: 07/15/2017 16:24        Scheduled Meds: . amLODipine  5 mg Oral Daily  . cinacalcet  60 mg Oral Daily  . lanthanum  1,000 mg Oral TID WC  . losartan  25 mg  Oral QHS  . pantoprazole  40 mg Oral Daily  . polyethylene glycol  17 g Oral Daily  . warfarin  5 mg Oral ONCE-1800  . Warfarin - Pharmacist Dosing Inpatient   Does not apply q1800   Continuous Infusions: . heparin 1,200 Units/hr (07/17/17 1339)     LOS: 2 days     Joycelyn Liska, MD, FACP, FHM. [facility] Pager [phone]  If 7PM-7AM, please contact night-coverage [email or web address] Password [facility] 07/17/2017, 3:54 PM

## 2017-07-18 DIAGNOSIS — E1121 Type 2 diabetes mellitus with diabetic nephropathy: Secondary | ICD-10-CM

## 2017-07-18 LAB — GLUCOSE, CAPILLARY
GLUCOSE-CAPILLARY: 150 mg/dL — AB (ref 65–99)
Glucose-Capillary: 190 mg/dL — ABNORMAL HIGH (ref 65–99)
Glucose-Capillary: 252 mg/dL — ABNORMAL HIGH (ref 65–99)

## 2017-07-18 LAB — CBC
HCT: 33.2 % — ABNORMAL LOW (ref 39.0–52.0)
Hemoglobin: 11.1 g/dL — ABNORMAL LOW (ref 13.0–17.0)
MCH: 32.2 pg (ref 26.0–34.0)
MCHC: 33.4 g/dL (ref 30.0–36.0)
MCV: 96.2 fL (ref 78.0–100.0)
PLATELETS: 346 10*3/uL (ref 150–400)
RBC: 3.45 MIL/uL — AB (ref 4.22–5.81)
RDW: 14.7 % (ref 11.5–15.5)
WBC: 9.3 10*3/uL (ref 4.0–10.5)

## 2017-07-18 LAB — PROTIME-INR
INR: 1.43
PROTHROMBIN TIME: 17.4 s — AB (ref 11.4–15.2)

## 2017-07-18 LAB — HEPARIN LEVEL (UNFRACTIONATED)
Heparin Unfractionated: 0.91 IU/mL — ABNORMAL HIGH (ref 0.30–0.70)
Heparin Unfractionated: 0.88 IU/mL — ABNORMAL HIGH (ref 0.30–0.70)

## 2017-07-18 MED ORDER — HEPARIN (PORCINE) IN NACL 100-0.45 UNIT/ML-% IJ SOLN
1100.0000 [IU]/h | INTRAMUSCULAR | Status: DC
  Administered 2017-07-18 – 2017-07-21 (×5): 1100 [IU]/h via INTRAVENOUS
  Filled 2017-07-18 (×4): qty 250

## 2017-07-18 MED ORDER — WARFARIN SODIUM 5 MG PO TABS
5.0000 mg | ORAL_TABLET | Freq: Once | ORAL | Status: AC
  Administered 2017-07-18: 5 mg via ORAL
  Filled 2017-07-18: qty 1

## 2017-07-18 MED ORDER — SODIUM CHLORIDE 0.9 % IV SOLN: 100.0000 mL | INTRAVENOUS | Status: DC | PRN

## 2017-07-18 MED ORDER — LIDOCAINE HCL (PF) 1 % IJ SOLN: 5.0000 mL | INTRAMUSCULAR | Status: DC | PRN

## 2017-07-18 MED ORDER — LIDOCAINE-PRILOCAINE 2.5-2.5 % EX CREA: 1.0000 "application " | TOPICAL_CREAM | CUTANEOUS | Status: DC | PRN

## 2017-07-18 MED ORDER — ALTEPLASE 2 MG IJ SOLR: 2.0000 mg | Freq: Once | INTRAMUSCULAR | Status: DC | PRN

## 2017-07-18 MED ORDER — PENTAFLUOROPROP-TETRAFLUOROETH EX AERO: 1.0000 "application " | INHALATION_SPRAY | CUTANEOUS | Status: DC | PRN

## 2017-07-18 MED ORDER — INSULIN ASPART 100 UNIT/ML ~~LOC~~ SOLN
0.0000 [IU] | Freq: Three times a day (TID) | SUBCUTANEOUS | Status: DC
  Administered 2017-07-18: 5 [IU] via SUBCUTANEOUS
  Administered 2017-07-19: 2 [IU] via SUBCUTANEOUS
  Administered 2017-07-19: 3 [IU] via SUBCUTANEOUS

## 2017-07-18 MED ORDER — HEPARIN (PORCINE) IN NACL 100-0.45 UNIT/ML-% IJ SOLN
1250.0000 [IU]/h | INTRAMUSCULAR | Status: DC
  Administered 2017-07-18: 1250 [IU]/h via INTRAVENOUS

## 2017-07-18 MED ORDER — HEPARIN SODIUM (PORCINE) 1000 UNIT/ML DIALYSIS: 1000.0000 [IU] | INTRAMUSCULAR | Status: DC | PRN

## 2017-07-18 NOTE — Progress Notes (Signed)
[facility] Progress Note   Subjective: showed me a small amt of blood on tissue paper.    Objective Vitals:   07/17/17 1815 07/17/17 2144 07/18/17 0615 07/18/17 0947  BP: 113/71 122/86 110/74 120/73  Pulse: 91 95 88 87  Resp: 18 14 15 18   Temp: 98.2 F (36.8 C) 98.1 F (36.7 C) 98.3 F (36.8 C) 98.3 F (36.8 C)  TempSrc: Oral Oral Oral Oral  SpO2: 99% 100% 97% 97%  Weight:  96.5 kg (212 lb 11.2 oz)    Height:       Physical Exam General: Pleasant, alert, NAD Heart: S1,S2 No M/G/R Lungs: CTAB A/P Abdomen: SNT Extremities: No LE edema Dialysis Access: RUA AVF + bruit   Additional Objective Labs: Basic Metabolic Panel: Recent Labs  Lab 07/15/17 1127 07/15/17 1735 07/16/17 0333  NA 132*  --  132*  K 5.7* 6.0* 4.6  CL 94*  --  95*  CO2 24  --  23  GLUCOSE 208*  --  90  BUN 44*  --  50*  CREATININE 10.56*  --  11.26*  CALCIUM 8.0*  --  7.2*   Liver Function Tests: Recent Labs  Lab 07/15/17 1127 07/16/17 0333  AST 41 30  ALT 42 34  ALKPHOS 99 85  BILITOT 0.9 1.0  PROT 8.0 7.1  ALBUMIN 3.7 3.3*   No results for input(s): LIPASE, AMYLASE in the last 168 hours. CBC: Recent Labs  Lab 07/15/17 1127 07/16/17 0333 07/17/17 0423 07/18/17 0720  WBC 10.0 10.8* 9.7 9.3  HGB 11.7* 10.8* 11.5* 11.1*  HCT 34.8* 31.9* 34.1* 33.2*  MCV 98.9 97.6 98.0 96.2  PLT 361 346 367 346   Blood Culture    Component Value Date/Time   SDES BLOOD LEFT ANTECUBITAL 09/29/2016 0950   SPECREQUEST BOTTLES DRAWN AEROBIC AND ANAEROBIC  5CC 09/29/2016 0950   CULT NO GROWTH 5 DAYS 09/29/2016 0950   REPTSTATUS 10/04/2016 FINAL 09/29/2016 0950    Cardiac Enzymes: No results for input(s): CKTOTAL, CKMB, CKMBINDEX, TROPONINI in the last 168 hours. CBG: Recent Labs  Lab 07/16/17 0329 07/17/17 1733 07/17/17 2148 07/18/17 0745 07/18/17 1142  GLUCAP 83 200* 195* 150* 190*   Iron Studies: No results for input(s): IRON, TIBC, TRANSFERRIN, FERRITIN in the last  72 hours. @lablastinr3 @ Studies/Results: No results found. Medications: . heparin 1,250 Units/hr (07/18/17 1007)   . amLODipine  5 mg Oral Daily  . cinacalcet  60 mg Oral Daily  . lanthanum  1,000 mg Oral TID WC  . losartan  25 mg Oral QHS  . pantoprazole  40 mg Oral Daily  . polyethylene glycol  17 g Oral Daily  . warfarin  5 mg Oral ONCE-1800  . Warfarin - Pharmacist Dosing Inpatient   Does not apply q1800     Dialysis Orders: [facility] 4h  92kg  2/2.5  Hep 6000  RUA AVF -Hectorol 1 mcg IV TIW -No ESA/Venofer  BMD meds:  -Auryxia 210 mg 2 tabs TID A/C -Sensipar 60 mg PO q evening  Assessment/Plan: 1. GIB: unclear origin. GI consulted. Conservative treatment for now.  HGB ^ 11's.  No bloody stools. Blood on tissue paper in small amts. Supra-therapeutic INR on adm. Rec'd Vit K. Coumadin on hold. Per primary. 2. H/O DVT/PE: Coumadin on hold per primary  3. ESRD -  TTS HD.  HD today 4. Hypertension/volume  - BP well controlled. Continue Losartan 25 mg PO q day, amlodipine 5 mg PO daily. No evidence of  volume overload. 5. Anemia  - No ESA ordered as OP, none needed now. Follow HGB.  6. Metabolic bone disease -  Continue binders/VDRA when able to eat. Add Renal function to today's labs. 7. Nutrition - Full liquid diet at present. Albumin 3.7 8. DM: Per primary. 9. Disposition: Stable. Per primary.  HD today    Kelly Splinter MD [facility] pager [phone]   07/18/2017, 12:35 PM

## 2017-07-18 NOTE — Progress Notes (Signed)
PROGRESS NOTE   KAYLUB DETIENNE  WUJ:[id number]    DOB: 1960-10-12    DOA: 07/15/2017  PCP: Arnoldo Morale, MD   I have briefly reviewed patients previous medical records in [facility].  Brief Narrative:  56 year old male with PMH of ESRD on TTS HD, HTN, DM 2, chronic diastolic CHF, GERD, PAD, PE/right femoral DVT 04/02/17 on Coumadin managed at [place], presented to ED on 07/15/17 with rectal bleeding, INR 4.1 and hemoglobin 11.7. Supratherapeutic INR was reversed with vitamin K. Rectal bleeding has resolved. GI was consulted and recommend conservative management. Nephrology consulted for dialysis needs. Resume Coumadin with IV heparin bridging. DC home when INR therapeutic >2.   Assessment & Plan:   Active Problems:   Acute GI bleeding   Acute lower GI bleeding - In the setting of elevated INR 4.13 but source not clearly known. Supratherapeutic INR was reversed with a dose of vitamin K. Rectal bleeding has resolved. - As per [facility], dark stools that he had could be from iron supplements. Discussed with Dr. Alessandra Bevels, GI and okay to resume Coumadin with IV heparin bridge. - May need outpatient EGD once safe to hold anticoagulation. GI will arrange follow-up in 4 weeks. - Hemoglobin remained stable.  History of DVT/PE 04/02/17 As discussed above started Coumadin and IV heparin bridging. Discussed with pharmacy and low molecular weight heparin not ideal for bridging in dialysis patients. INR 1.4 and may take a couple days to become therapeutic due to getting a dose of vitamin K this admission. Discussed with patient and aware.  ESRD on TTS HD Nephrology following. Next HD on 11/15. Hyperkalemia resolved. Discussed with Dr. Jonnie Finner.  Essential hypertension Controlled on losartan and amlodipine. No clinical volume overload but nephrology indicates that he is 2.6 kg over and plan to manage volume across dialysis.  Anemia due to chronic  kidney disease  Stable.  Type II DM with renal complications - Oral hypoglycemics held. Monitor CBGs and SSI as needed. Mildly uncontrolled.     DVT prophylaxis: IV heparin bridging and Coumadin per pharmacy  Code Status: Full  Family Communication: None at bedside  Disposition: DC home when INR therapeutic >2. May take a couple of days.  Consultants:  Nephrology [facility]   Procedures:  None  Antimicrobials:  None    Subjective: When I saw him this morning, patient reported having a normal BM without blood or black color stools. However during nephrology follow-up, he reported small amount of blood on tissue paper. Tolerating diet without nausea or vomiting.  ROS: No chest pain, dyspnea, palpitations or dizziness reported.   Objective:  Vitals:   07/17/17 1815 07/17/17 2144 07/18/17 0615 07/18/17 0947  BP: 113/71 122/86 110/74 120/73  Pulse: 91 95 88 87  Resp: 18 14 15 18   Temp: 98.2 F (36.8 C) 98.1 F (36.7 C) 98.3 F (36.8 C) 98.3 F (36.8 C)  TempSrc: Oral Oral Oral Oral  SpO2: 99% 100% 97% 97%  Weight:  96.5 kg (212 lb 11.2 oz)    Height:        Examination:  General exam: Pleasant middle-aged male lying comfortably supine in bed this morning. Respiratory system: Clear to auscultation. Respiratory effort normal. Stable without change. Cardiovascular system: S1 & S2 heard, RRR. No JVD, murmurs, rubs, gallops or clicks. No pedal edema.  Gastrointestinal system: Abdomen is nondistended, soft and nontender. No organomegaly or masses felt. Normal bowel sounds heard. Stable without change. Central nervous system: Alert  and oriented. No focal neurological deficits. Stable without change. Extremities: Symmetric 5 x 5 power. Skin: No rashes, lesions or ulcers Psychiatry: Judgement and insight appear normal. Mood & affect appropriate.     Data Reviewed: I have personally reviewed following labs and imaging studies  CBC: Recent Labs  Lab 07/15/17 1127  07/16/17 0333 07/17/17 0423 07/18/17 0720  WBC 10.0 10.8* 9.7 9.3  HGB 11.7* 10.8* 11.5* 11.1*  HCT 34.8* 31.9* 34.1* 33.2*  MCV 98.9 97.6 98.0 96.2  PLT 361 346 367 354   Basic Metabolic Panel: Recent Labs  Lab 07/15/17 1127 07/15/17 1735 07/16/17 0333  NA 132*  --  132*  K 5.7* 6.0* 4.6  CL 94*  --  95*  CO2 24  --  23  GLUCOSE 208*  --  90  BUN 44*  --  50*  CREATININE 10.56*  --  11.26*  CALCIUM 8.0*  --  7.2*   Liver Function Tests: Recent Labs  Lab 07/15/17 1127 07/16/17 0333  AST 41 30  ALT 42 34  ALKPHOS 99 85  BILITOT 0.9 1.0  PROT 8.0 7.1  ALBUMIN 3.7 3.3*   Coagulation Profile: Recent Labs  Lab 07/15/17 1356 07/17/17 0423 07/18/17 0720  INR 4.13* 1.70 1.43   CBG: Recent Labs  Lab 07/16/17 0329 07/17/17 1733 07/17/17 2148 07/18/17 0745 07/18/17 1142  GLUCAP 83 200* 195* 150* 190*    Recent Results (from the past 240 hour(s))  MRSA PCR Screening     Status: None   Collection Time: 07/16/17  3:54 AM  Result Value Ref Range Status   MRSA by PCR NEGATIVE NEGATIVE Final    Comment:        The GeneXpert MRSA Assay (FDA approved for NASAL specimens only), is one component of a comprehensive MRSA colonization surveillance program. It is not intended to diagnose MRSA infection nor to guide or monitor treatment for MRSA infections.          Radiology Studies: No results found.      Scheduled Meds: . amLODipine  5 mg Oral Daily  . cinacalcet  60 mg Oral Daily  . lanthanum  1,000 mg Oral TID WC  . losartan  25 mg Oral QHS  . pantoprazole  40 mg Oral Daily  . polyethylene glycol  17 g Oral Daily  . warfarin  5 mg Oral ONCE-1800  . Warfarin - Pharmacist Dosing Inpatient   Does not apply q1800   Continuous Infusions: . heparin 1,250 Units/hr (07/18/17 1007)     LOS: 3 days     Vernell Leep, MD, FACP, [facility]. [facility] Pager [phone]  If 7PM-7AM, please contact night-coverage [email or web address] Password  [email or web address] 07/18/2017, 1:12 PM

## 2017-07-18 NOTE — Progress Notes (Signed)
ANTICOAGULATION CONSULT NOTE -Follow up Pharmacy Consult for heparin + warfarin Indication:  h/o DVT/PE (04/02/17)    Allergies  Allergen Reactions  . No Known Allergies     Patient Measurements: Height: 5\' 11"  (180.3 cm) Weight: 212 lb 11.2 oz (96.5 kg) IBW/kg (Calculated) : 75.3 Heparin Dosing Weight: 94kg  Vital Signs: Temp: 98.3 F (36.8 C) (11/15 0615) Temp Source: Oral (11/15 0615) BP: 110/74 (11/15 0615) Pulse Rate: 88 (11/15 0615)  Labs: Recent Labs    07/15/17 1127 07/15/17 1356 07/16/17 0333 07/17/17 0423 07/17/17 2104 07/18/17 0720  HGB 11.7*  --  10.8* 11.5*  --  11.1*  HCT 34.8*  --  31.9* 34.1*  --  33.2*  PLT 361  --  346 367  --  346  LABPROT  --  39.4*  --  19.8*  --  17.4*  INR  --  4.13*  --  1.70  --  1.43  HEPARINUNFRC  --   --   --   --  <0.10* 0.91*  CREATININE 10.56*  --  11.26*  --   --   --     Estimated Creatinine Clearance: 8.7 mL/min (A) (by C-G formula based on SCr of 11.26 mg/dL (H)).   Medical History: Past Medical History:  Diagnosis Date  . Anemia   . Cellulitis and abscess of foot 08/24/2011  . Cocaine abuse ([place]) 2016  . Diastolic congestive heart failure ([place])   . ESRD on dialysis ([place])    "TTS; [address]" (10/29/2016)  . GERD (gastroesophageal reflux disease)    unsure  . HCVD (hypertensive cardiovascular disease) 09/2016   Echo shows moderate to severe LAE, grade 1 DD  . Hypertension 2015  . Left atrial mass 04/04/2017  . Pneumonia 10/2016  . Protein calorie malnutrition ([place])   . PVD (peripheral vascular disease) ([place])   . Type II diabetes mellitus ([place]) 2015     Assessment: 24 YOM admitted with GIB. On warfarin PTA (home dose 5mg  daily)- held upon admission. Admit INR elevated at 4.13- patient received vitamin K 1mg  IV x1 on 11/12 PM.  Rectal bleeding has resolved per MD's report. [facility] noted, dark stools that he had could be from iron supplements. GI is not planning for EGD or colonoscopy thus on IV Heparin  bridge and resumed warfarin for h/o RLE DVT/PE (04/02/17).   INR today down to 1.43, subtherapeutic INR. Heparin level = 0.91 on heparin drip 1450 units/hr.  Supratherapeutic level.   Heparin infusion changed to new IV site last night after level reported to be undetectable <0.1 units/ml.  Hgb low/stable 11.1 and pltc wnl. No bleeding reported. .  Goal of Therapy:  INR 2-3 Heparin level 0.3-0.7 units/ml Monitor platelets by anticoagulation protocol: Yes   Plan:  Decrease IV heparin drip to 1250 units/hr  Heparin level in ~ 6h Give Warfarin 5 mg po x1 today Daily heparin level, CBC, and INR  Nicole Cella, RPh Clinical Pharmacist 8a-330p [phone] 330p-1030p phone [phone] or [place] 07/18/2017 9:45 AM

## 2017-07-18 NOTE — Progress Notes (Signed)
ANTICOAGULATION CONSULT NOTE -Follow up  Pharmacy Consult for heparin  Indication:  h/o DVT/PE (04/02/17)    Allergies  Allergen Reactions  . No Known Allergies     Patient Measurements: Height: 5\' 11"  (180.3 cm) Weight: 215 lb 6.2 oz (97.7 kg) IBW/kg (Calculated) : 75.3 Heparin Dosing Weight: 94kg  Vital Signs: Temp: 97.7 F (36.5 C) (11/15 1330) Temp Source: Oral (11/15 1330) BP: 114/78 (11/15 1600) Pulse Rate: 97 (11/15 1600)  Labs: Recent Labs    07/16/17 0333 07/17/17 0423 07/17/17 2104 07/18/17 0720 07/18/17 1600  HGB 10.8* 11.5*  --  11.1*  --   HCT 31.9* 34.1*  --  33.2*  --   PLT 346 367  --  346  --   LABPROT  --  19.8*  --  17.4*  --   INR  --  1.70  --  1.43  --   HEPARINUNFRC  --   --  <0.10* 0.91* 0.88*  CREATININE 11.26*  --   --   --   --     Estimated Creatinine Clearance: 8.7 mL/min (A) (by C-G formula based on SCr of 11.26 mg/dL (H)).  Medications: Heparin @ 1250 units/hr  Assessment: 7 YOM admitted with GIB. On warfarin PTA (home dose 5mg  daily)- held upon admission. Admit INR elevated at 4.13- patient received vitamin K 1mg  IV x1 on 11/12 PM.  Rectal bleeding has resolved per MD's report. [facility] noted dark stools that he had could be from iron supplements. GI is not planning for EGD or colonoscopy thus on IV Heparin bridge and resumed warfarin for h/o RLE DVT/PE (04/02/17).   Heparin level remains elevated at 0.88 (only ~ 4 hour level). No bleeding reported.  Goal of Therapy:  Heparin level 0.3-0.7 Monitor platelets by anticoagulation protocol: Yes   Plan:  1) Decrease heparin to 1100 units/hr 2) Check 8 hour heparin level   Nena Jordan, PharmD, BCPS 07/18/2017 4:36 PM

## 2017-07-19 LAB — PROTIME-INR
INR: 1.46
Prothrombin Time: 17.6 seconds — ABNORMAL HIGH (ref 11.4–15.2)

## 2017-07-19 LAB — CBC
HEMATOCRIT: 32.7 % — AB (ref 39.0–52.0)
Hemoglobin: 11.1 g/dL — ABNORMAL LOW (ref 13.0–17.0)
MCH: 32.7 pg (ref 26.0–34.0)
MCHC: 33.9 g/dL (ref 30.0–36.0)
MCV: 96.5 fL (ref 78.0–100.0)
PLATELETS: 348 10*3/uL (ref 150–400)
RBC: 3.39 MIL/uL — ABNORMAL LOW (ref 4.22–5.81)
RDW: 14.8 % (ref 11.5–15.5)
WBC: 10.7 10*3/uL — ABNORMAL HIGH (ref 4.0–10.5)

## 2017-07-19 LAB — HEPARIN LEVEL (UNFRACTIONATED)
Heparin Unfractionated: 0.61 IU/mL (ref 0.30–0.70)
Heparin Unfractionated: 0.64 IU/mL (ref 0.30–0.70)
Heparin Unfractionated: 1.06 IU/mL — ABNORMAL HIGH (ref 0.30–0.70)

## 2017-07-19 LAB — GLUCOSE, CAPILLARY
GLUCOSE-CAPILLARY: 317 mg/dL — AB (ref 65–99)
GLUCOSE-CAPILLARY: 220 mg/dL — AB (ref 65–99)
GLUCOSE-CAPILLARY: 172 mg/dL — AB (ref 65–99)
Glucose-Capillary: 155 mg/dL — ABNORMAL HIGH (ref 65–99)

## 2017-07-19 MED ORDER — INSULIN ASPART 100 UNIT/ML ~~LOC~~ SOLN
0.0000 [IU] | Freq: Three times a day (TID) | SUBCUTANEOUS | Status: DC
  Administered 2017-07-19 – 2017-07-20 (×2): 2 [IU] via SUBCUTANEOUS
  Administered 2017-07-20 – 2017-07-21 (×2): 1 [IU] via SUBCUTANEOUS
  Administered 2017-07-21: 5 [IU] via SUBCUTANEOUS
  Administered 2017-07-21: 3 [IU] via SUBCUTANEOUS
  Administered 2017-07-22: 5 [IU] via SUBCUTANEOUS
  Administered 2017-07-22: 3 [IU] via SUBCUTANEOUS
  Administered 2017-07-23: 1 [IU] via SUBCUTANEOUS
  Administered 2017-07-23 (×2): 2 [IU] via SUBCUTANEOUS
  Administered 2017-07-25: 7 [IU] via SUBCUTANEOUS
  Administered 2017-07-25: 3 [IU] via SUBCUTANEOUS
  Administered 2017-07-25: 2 [IU] via SUBCUTANEOUS
  Administered 2017-07-26 (×2): 5 [IU] via SUBCUTANEOUS
  Administered 2017-07-26 – 2017-07-28 (×2): 3 [IU] via SUBCUTANEOUS
  Administered 2017-07-28 (×2): 5 [IU] via SUBCUTANEOUS
  Administered 2017-07-29: 2 [IU] via SUBCUTANEOUS
  Administered 2017-07-29: 1 [IU] via SUBCUTANEOUS

## 2017-07-19 MED ORDER — INSULIN ASPART 100 UNIT/ML ~~LOC~~ SOLN
0.0000 [IU] | Freq: Every day | SUBCUTANEOUS | Status: DC
  Administered 2017-07-20: 3 [IU] via SUBCUTANEOUS
  Administered 2017-07-21: 5 [IU] via SUBCUTANEOUS
  Administered 2017-07-24: 3 [IU] via SUBCUTANEOUS
  Administered 2017-07-25: 4 [IU] via SUBCUTANEOUS
  Administered 2017-07-26 – 2017-07-27 (×2): 3 [IU] via SUBCUTANEOUS
  Administered 2017-07-29: 4 [IU] via SUBCUTANEOUS
  Administered 2017-07-29: 2 [IU] via SUBCUTANEOUS

## 2017-07-19 MED ORDER — WARFARIN SODIUM 7.5 MG PO TABS
7.5000 mg | ORAL_TABLET | Freq: Once | ORAL | Status: AC
  Administered 2017-07-19: 7.5 mg via ORAL
  Filled 2017-07-19: qty 1

## 2017-07-19 NOTE — Care Management Note (Addendum)
Case Management Note  Patient Details  Name: [name] MRN: [id number] Date of Birth: 08-Nov-1960  Subjective/Objective:      CM following for progression and d/c planning.               Action/Plan: 07/19/2017 No HH or DME needs identified. Pt independent prior to admission and will d/c to home. Per record pt has [place] aide , he will notify this aide of his return to home.  Pt will be receiving follow[facility] services from [facility].    Expected Discharge Date:                  Expected Discharge Plan:  Home/Self Care  In-House Referral:  NA  Discharge planning Services  NA  Post Acute Care Choice:  NA Choice offered to:  NA  DME Arranged:  N/A DME Agency:  NA  HH Arranged:  NA HH Agency:  NA  Status of Service:  Completed, signed off  If discussed at [place] of Stay Meetings, dates discussed:    Additional Comments:  Adron Bene, RN 07/19/2017, 4:18 PM

## 2017-07-19 NOTE — Progress Notes (Signed)
Subjective:  No cos , tolerated hd yest. No stool recently   Objective Vital signs in last 24 hours: Vitals:   07/18/17 1639 07/18/17 2100 07/19/17 0530 07/19/17 0800  BP: 109/77 109/71 95/63 94/68   Pulse: 100 99 96 69  Resp: 18 19    Temp: 98.4 F (36.9 C) 98.6 F (37 C) 98.5 F (36.9 C) 98.6 F (37 C)  TempSrc: Oral Oral Oral Oral  SpO2: 98% 99% 99% 97%  Weight: 94.4 kg (208 lb 1.8 oz) 95.6 kg (210 lb 12.2 oz)    Height:       Weight change: 1.22 kg (2 lb 11 oz)  Physical Exam: General: Pleasant, alert, OX3  NAD Heart: S1,S2 No M/G/R Lungs: CTA Bilt  A/P Abdomen:  Bs pos. ,Soft NT, ND Extremities: No LE edema Dialysis Access: RUA AVF + bruit  Dialysis Orders:TTS [place] 4h  92kg  2/2.5  Hep 6000  RUA AVF -Hectorol 1 mcg IV TIW -No ESA/Venofer  BMD meds:  -Auryxia 210 mg 2 tabs TID A/C -Sensipar 60 mg PO q evening    Problem/Plan: 1. GIB: withSupra-therapeutic INR on adm./  unclear origin./ GI consulted. Conservative treatment for now.  HGB  stable 11.5>11.1>>11.1 this am   No bloody stools now, prior blood on tissue paper in small amts.  Rec'd Vit K. Coumadin on hold. Per primary. 2. H/O DVT/PE: per primary / Coumadin on hold per primary  3. ESRD - TTS HD.  HD in am  4. Hypertension/volume - BP well controlled to lowish  asymp this am . On  Losartan 25 mg PO q day, amlodipine 5 mg PO daily. No evidence of volume overload. Dc Amlodipine for now  5. Anemia - am hgb 11.1 and stable, no ESA needs /Not on as OP 6. Metabolic bone disease - Core[place] ca 7.8  On sensipar  , will dc with low ca,  Use 2.5 ca bath in am , fu ca/phos , op binder=Auryxia ,Continue Hec on hd . 7. Nutrition - Full liquid diet at present. Albumin 3.3 8. DM: Per primary. 9. Disposition: Stable . Per primary.  HD  In am    Ernest Haber, PA-C [place] [phone] 07/19/2017,9:46 AM  LOS: 4 days   Pt seen, examined and agree w A/P as above.  Kelly Splinter MD [place]  [facility] pager [phone]   07/19/2017, 3:04 PM    Labs: Basic Metabolic Panel: Recent Labs  Lab 07/15/17 1127 07/15/17 1735 07/16/17 0333  NA 132*  --  132*  K 5.7* 6.0* 4.6  CL 94*  --  95*  CO2 24  --  23  GLUCOSE 208*  --  90  BUN 44*  --  50*  CREATININE 10.56*  --  11.26*  CALCIUM 8.0*  --  7.2*   Liver Function Tests: Recent Labs  Lab 07/15/17 1127 07/16/17 0333  AST 41 30  ALT 42 34  ALKPHOS 99 85  BILITOT 0.9 1.0  PROT 8.0 7.1  ALBUMIN 3.7 3.3*   No results for input(s): LIPASE, AMYLASE in the last 168 hours. No results for input(s): AMMONIA in the last 168 hours. CBC: Recent Labs  Lab 07/15/17 1127 07/16/17 0333 07/17/17 0423 07/18/17 0720 07/19/17 0212  WBC 10.0 10.8* 9.7 9.3 10.7*  HGB 11.7* 10.8* 11.5* 11.1* 11.1*  HCT 34.8* 31.9* 34.1* 33.2* 32.7*  MCV 98.9 97.6 98.0 96.2 96.5  PLT 361 346 367 346 348   Cardiac Enzymes: No results for input(s):  CKTOTAL, CKMB, CKMBINDEX, TROPONINI in the last 168 hours. CBG: Recent Labs  Lab 07/18/17 0745 07/18/17 1142 07/18/17 1734 07/18/17 2054 07/19/17 0746  GLUCAP 150* 190* 252* 317* 155*    Studies/Results: No results found. Medications: . sodium chloride    . sodium chloride    . heparin 1,100 Units/hr (07/19/17 0549)   . amLODipine  5 mg Oral Daily  . cinacalcet  60 mg Oral Daily  . insulin aspart  0-9 Units Subcutaneous TID WC  . lanthanum  1,000 mg Oral TID WC  . losartan  25 mg Oral QHS  . pantoprazole  40 mg Oral Daily  . polyethylene glycol  17 g Oral Daily  . Warfarin - Pharmacist Dosing Inpatient   Does not apply [phone]

## 2017-07-19 NOTE — Care Management Important Message (Signed)
Important Message  Patient Details  Name: [name] MRN: [id number] Date of Birth: [birth year]   Medicare Important Message Given:  Yes    Tenzin Pavon Abena 07/19/2017, 12:21 PM

## 2017-07-19 NOTE — Progress Notes (Signed)
CBG is 205, not showing up in flowsheets.   Eleanora Neighbor, RN

## 2017-07-19 NOTE — Progress Notes (Signed)
ANTICOAGULATION CONSULT NOTE -Follow up  Pharmacy Consult for heparin  Indication:  h/o DVT/PE (04/02/17)    Allergies  Allergen Reactions  . No Known Allergies    Patient Measurements: Height: 5\' 11"  (180.3 cm) Weight: 210 lb 12.2 oz (95.6 kg) IBW/kg (Calculated) : 75.3 Heparin Dosing Weight: 94kg  Vital Signs: Temp: 98.6 F (37 C) (11/15 2100) Temp Source: Oral (11/15 2100) BP: 109/71 (11/15 2100) Pulse Rate: 99 (11/15 2100)  Labs: Recent Labs    07/16/17 0333 07/17/17 0423  07/18/17 0720 07/18/17 1600 07/19/17 0007  HGB 10.8* 11.5*  --  11.1*  --   --   HCT 31.9* 34.1*  --  33.2*  --   --   PLT 346 367  --  346  --   --   LABPROT  --  19.8*  --  17.4*  --   --   INR  --  1.70  --  1.43  --   --   HEPARINUNFRC  --   --    < > 0.91* 0.88* 1.06*  CREATININE 11.26*  --   --   --   --   --    < > = values in this interval not displayed.    Estimated Creatinine Clearance: 8.6 mL/min (A) (by C-G formula based on SCr of 11.26 mg/dL (H)).  Assessment: 4 YOM admitted with GIB. On warfarin PTA (home dose 5mg  daily)- held on admission. Admit INR elevated at 4.13- patient received vitamin K 1mg  IV x1 on 11/12 PM. Heparin bridge and warfarin resumed on 11/14 for h/o RLE DVT/PE.   Heparin level supratherapeutic at 1.06 despite previous rate decrease. RN reports lab was drawn in same hand the heparin gtt was infusing. STAT re-draw (from foot) is now therapeutic at 0.61.   CBC stable and no s/s bleeding documented.   Goal of Therapy:  Heparin level 0.3-0.7 Monitor platelets by anticoagulation protocol: Yes   Plan:  Continue heparin gtt at 1100 units/hr Confirm heparin level in 8 hrs Daily heparin level and CBC Monitor for s/s bleeding   Lavonda Jumbo, PharmD Clinical Pharmacist 07/19/17 3:01 AM

## 2017-07-19 NOTE — Progress Notes (Signed)
PROGRESS NOTE   [name]  HDQ:[id number]    DOB: Jul 20, 1961    DOA: 07/15/2017  PCP: Arnoldo Morale, MD   I have briefly reviewed patients previous medical records in [facility].  Brief Narrative:  56 year old male with PMH of ESRD on TTS HD, HTN, DM 2, chronic diastolic CHF, GERD, PAD, PE/right femoral DVT 04/02/17 on Coumadin managed at [place], presented to ED on 07/15/17 with rectal bleeding, INR 4.1 and hemoglobin 11.7. Supratherapeutic INR was reversed with vitamin K. Rectal bleeding has resolved. GI was consulted and recommend conservative management. Nephrology consulted for dialysis needs. Resume Coumadin with IV heparin bridging. DC home when INR therapeutic >2.   Assessment & Plan:   Active Problems:   Acute GI bleeding   Acute lower GI bleeding - In the setting of elevated INR 4.13 but source not clearly known. Supratherapeutic INR was reversed with a dose of vitamin K. Rectal bleeding has resolved. - As per [facility], dark stools that he had could be from iron supplements. Discussed with Dr. Alessandra Bevels, GI and okay to resume Coumadin with IV heparin bridge. - May need outpatient EGD once safe to hold anticoagulation. GI will arrange follow-up in 4 weeks. - Hemoglobin remained stable. - No further rectal bleeding.  History of DVT/PE 04/02/17 As discussed above started Coumadin and IV heparin bridging. Discussed with pharmacy and low molecular weight heparin not ideal for bridging in dialysis patients. INR 1.4 and may take a couple days to become therapeutic due to getting a dose of vitamin K this admission. Discussed with patient and aware. Discussed with pharmacy who will provide slightly increased dose of Coumadin today and monitor INR.  ESRD on TTS HD Nephrology following. Next HD on 11/15. Hyperkalemia resolved.   Essential hypertension Controlled on losartan. Volume management across dialysis. Nephrology discontinued  amlodipine.  Anemia due to chronic kidney disease  Stable.  Type II DM with renal complications - Oral hypoglycemics held. Monitor CBGs and SSI as needed. Mildly uncontrolled. Add HS scale. May need to consider low dose Lantus if not better.     DVT prophylaxis: IV heparin bridging and Coumadin per pharmacy  Code Status: Full  Family Communication: None at bedside  Disposition: DC home when INR therapeutic >2. May take a couple of days.  Consultants:  Nephrology [facility]   Procedures:  None  Antimicrobials:  None    Subjective: No further rectal bleeding. Had normal BM today. No blood on toilet paper either.  ROS: No chest pain, dyspnea, palpitations or dizziness reported.   Objective:  Vitals:   07/18/17 1639 07/18/17 2100 07/19/17 0530 07/19/17 0800  BP: 109/77 109/71 95/63 94/68   Pulse: 100 99 96 69  Resp: 18 19    Temp: 98.4 F (36.9 C) 98.6 F (37 C) 98.5 F (36.9 C) 98.6 F (37 C)  TempSrc: Oral Oral Oral Oral  SpO2: 98% 99% 99% 97%  Weight: 94.4 kg (208 lb 1.8 oz) 95.6 kg (210 lb 12.2 oz)    Height:        Examination:  General exam: Pleasant middle-aged male lying comfortably supine in bed this morning. Respiratory system: Clear to auscultation. Respiratory effort normal. Stable without change. Cardiovascular system: S1 & S2 heard, RRR. No JVD, murmurs, rubs, gallops or clicks. No pedal edema. Stable. Gastrointestinal system: Abdomen is nondistended, soft and nontender. No organomegaly or masses felt. Normal bowel sounds heard. Stable without change. Central nervous system: Alert and oriented. No  focal neurological deficits. Stable without change. Extremities: Symmetric 5 x 5 power. Skin: No rashes, lesions or ulcers Psychiatry: Judgement and insight appear normal. Mood & affect appropriate.     Data Reviewed: I have personally reviewed following labs and imaging studies  CBC: Recent Labs  Lab 07/15/17 1127 07/16/17 0333 07/17/17 0423  07/18/17 0720 07/19/17 0212  WBC 10.0 10.8* 9.7 9.3 10.7*  HGB 11.7* 10.8* 11.5* 11.1* 11.1*  HCT 34.8* 31.9* 34.1* 33.2* 32.7*  MCV 98.9 97.6 98.0 96.2 96.5  PLT 361 346 367 346 003   Basic Metabolic Panel: Recent Labs  Lab 07/15/17 1127 07/15/17 1735 07/16/17 0333  NA 132*  --  132*  K 5.7* 6.0* 4.6  CL 94*  --  95*  CO2 24  --  23  GLUCOSE 208*  --  90  BUN 44*  --  50*  CREATININE 10.56*  --  11.26*  CALCIUM 8.0*  --  7.2*   Liver Function Tests: Recent Labs  Lab 07/15/17 1127 07/16/17 0333  AST 41 30  ALT 42 34  ALKPHOS 99 85  BILITOT 0.9 1.0  PROT 8.0 7.1  ALBUMIN 3.7 3.3*   Coagulation Profile: Recent Labs  Lab 07/15/17 1356 07/17/17 0423 07/18/17 0720 07/19/17 0212  INR 4.13* 1.70 1.43 1.46   CBG: Recent Labs  Lab 07/18/17 1142 07/18/17 1734 07/18/17 2054 07/19/17 0746 07/19/17 1118  GLUCAP 190* 252* 317* 155* 220*    Recent Results (from the past 240 hour(s))  MRSA PCR Screening     Status: None   Collection Time: 07/16/17  3:54 AM  Result Value Ref Range Status   MRSA by PCR NEGATIVE NEGATIVE Final    Comment:        The GeneXpert MRSA Assay (FDA approved for NASAL specimens only), is one component of a comprehensive MRSA colonization surveillance program. It is not intended to diagnose MRSA infection nor to guide or monitor treatment for MRSA infections.          Radiology Studies: No results found.      Scheduled Meds: . amLODipine  5 mg Oral Daily  . insulin aspart  0-9 Units Subcutaneous TID WC  . lanthanum  1,000 mg Oral TID WC  . losartan  25 mg Oral QHS  . pantoprazole  40 mg Oral Daily  . polyethylene glycol  17 g Oral Daily  . warfarin  7.5 mg Oral ONCE-1800  . Warfarin - Pharmacist Dosing Inpatient   Does not apply q1800   Continuous Infusions: . sodium chloride    . sodium chloride    . heparin 1,100 Units/hr (07/19/17 0549)     LOS: 4 days     Vernell Leep, MD, FACP, [facility]. [facility] Pager [phone]  If 7PM-7AM, please contact night-coverage [email or web address] Password [email or web address] 07/19/2017, 4:21 PM

## 2017-07-19 NOTE — Progress Notes (Signed)
ANTICOAGULATION CONSULT NOTE -Follow up  Pharmacy Consult for heparin  Indication:  h/o DVT/PE (04/02/17)    Allergies  Allergen Reactions  . No Known Allergies    Patient Measurements: Height: 5\' 11"  (180.3 cm) Weight: 210 lb 12.2 oz (95.6 kg) IBW/kg (Calculated) : 75.3 Heparin Dosing Weight: 94kg  Vital Signs: Temp: 98.6 F (37 C) (11/16 0800) Temp Source: Oral (11/16 0800) BP: 94/68 (11/16 0800) Pulse Rate: 69 (11/16 0800)  Labs: Recent Labs    07/17/17 0423  07/18/17 0720 07/18/17 1600 07/19/17 0007 07/19/17 0212  HGB 11.5*  --  11.1*  --   --  11.1*  HCT 34.1*  --  33.2*  --   --  32.7*  PLT 367  --  346  --   --  348  LABPROT 19.8*  --  17.4*  --   --  17.6*  INR 1.70  --  1.43  --   --  1.46  HEPARINUNFRC  --    < > 0.91* 0.88* 1.06* 0.61   < > = values in this interval not displayed.    Estimated Creatinine Clearance: 8.6 mL/min (A) (by C-G formula based on SCr of 11.26 mg/dL (H)).  Assessment: 60 YOM admitted with GIB. On warfarin PTA (home dose 5mg  daily)- held on admission. Admit INR elevated at 4.13- patient received vitamin K 1mg  IV x1 on 11/12 PM. Heparin bridge and warfarin resumed on 11/14 for h/o RLE DVT/PE.   Heparin level this morning remains therapeutic (HL 0.64 << 0.61, goal of 0.3-0.7). INR today remains SUBtherapeutic (INR 1.46 << 1.43, goal of 2-3). Will increase the warfarin dose today - continue heparin at the current rate.   The patient was re-educated on warfarin today.  Goal of Therapy:  Heparin level 0.3-0.7 INR 2-3 Monitor platelets by anticoagulation protocol: Yes   Plan:  1. Continue Heparin at 1100 units/hr (11 ml/hr) 2. Warfarin 7.5 mg x 1 dose at 1800 today 3. Will continue to monitor for any signs/symptoms of bleeding and will follow up with heparin level and PT/INR in the a.m.   Thank you for allowing pharmacy to be a part of this patient's care.  Alycia Rossetti, PharmD, BCPS Clinical Pharmacist Pager:  [phone] Clinical phone for 07/19/2017 from 7a-3:30p: [phone] If after 3:30p, please call main pharmacy at: [phone] 07/19/2017 10:21 AM

## 2017-07-20 DIAGNOSIS — E875 Hyperkalemia: Secondary | ICD-10-CM

## 2017-07-20 LAB — HEPARIN LEVEL (UNFRACTIONATED): Heparin Unfractionated: 0.42 IU/mL (ref 0.30–0.70)

## 2017-07-20 LAB — PROTIME-INR
INR: 1.65
Prothrombin Time: 19.4 seconds — ABNORMAL HIGH (ref 11.4–15.2)

## 2017-07-20 LAB — RENAL FUNCTION PANEL
ALBUMIN: 3.1 g/dL — AB (ref 3.5–5.0)
ANION GAP: 15 (ref 5–15)
BUN: 55 mg/dL — ABNORMAL HIGH (ref 6–20)
CHLORIDE: 95 mmol/L — AB (ref 101–111)
CO2: 21 mmol/L — AB (ref 22–32)
Calcium: 7.1 mg/dL — ABNORMAL LOW (ref 8.9–10.3)
Creatinine, Ser: 10.37 mg/dL — ABNORMAL HIGH (ref 0.61–1.24)
GFR calc Af Amer: 6 mL/min — ABNORMAL LOW (ref >60)
GFR calc non Af Amer: 5 mL/min — ABNORMAL LOW (ref >60)
GLUCOSE: 151 mg/dL — AB (ref 65–99)
POTASSIUM: 6.3 mmol/L — AB (ref 3.5–5.1)
Phosphorus: 6.3 mg/dL — ABNORMAL HIGH (ref 2.5–4.6)
SODIUM: 131 mmol/L — AB (ref 135–145)

## 2017-07-20 LAB — CBC
HEMATOCRIT: 31.1 % — AB (ref 39.0–52.0)
HEMOGLOBIN: 10.4 g/dL — AB (ref 13.0–17.0)
MCH: 32.7 pg (ref 26.0–34.0)
MCHC: 33.4 g/dL (ref 30.0–36.0)
MCV: 97.8 fL (ref 78.0–100.0)
Platelets: 345 10*3/uL (ref 150–400)
RBC: 3.18 MIL/uL — ABNORMAL LOW (ref 4.22–5.81)
RDW: 14.8 % (ref 11.5–15.5)
WBC: 10.9 10*3/uL — ABNORMAL HIGH (ref 4.0–10.5)

## 2017-07-20 LAB — GLUCOSE, CAPILLARY
GLUCOSE-CAPILLARY: 205 mg/dL — AB (ref 65–99)
GLUCOSE-CAPILLARY: 191 mg/dL — AB (ref 65–99)
GLUCOSE-CAPILLARY: 269 mg/dL — AB (ref 65–99)
Glucose-Capillary: 149 mg/dL — ABNORMAL HIGH (ref 65–99)

## 2017-07-20 MED ORDER — WARFARIN SODIUM 7.5 MG PO TABS
7.5000 mg | ORAL_TABLET | Freq: Once | ORAL | Status: AC
  Administered 2017-07-20: 7.5 mg via ORAL
  Filled 2017-07-20 (×2): qty 1

## 2017-07-20 NOTE — Progress Notes (Signed)
ANTICOAGULATION CONSULT NOTE -Follow up  Pharmacy Consult for heparin  Indication:  h/o DVT/PE (04/02/17)    Allergies  Allergen Reactions  . No Known Allergies    Patient Measurements: Height: 5\' 11"  (180.3 cm) Weight: 209 lb 7 oz (95 kg) IBW/kg (Calculated) : 75.3 Heparin Dosing Weight: 94kg  Vital Signs: Temp: 98 F (36.7 C) (11/17 1124) Temp Source: Oral (11/17 1124) BP: 121/78 (11/17 1124) Pulse Rate: 98 (11/17 1124)  Labs: Recent Labs    07/18/17 0720  07/19/17 0212 07/19/17 0933 07/20/17 0356 07/20/17 0743  HGB 11.1*  --  11.1*  --  10.4*  --   HCT 33.2*  --  32.7*  --  31.1*  --   PLT 346  --  348  --  345  --   LABPROT 17.4*  --  17.6*  --  19.4*  --   INR 1.43  --  1.46  --  1.65  --   HEPARINUNFRC 0.91*   < > 0.61 0.64 0.42  --   CREATININE  --   --   --   --   --  10.37*   < > = values in this interval not displayed.    Estimated Creatinine Clearance: 9.4 mL/min (A) (by C-G formula based on SCr of 10.37 mg/dL (H)).  Assessment: 26 YOM admitted with GIB. On warfarin PTA (home dose 5mg  daily)- held on admission. Admit INR elevated at 4.13- patient received vitamin K 1mg  IV x1 on 11/12 PM. Heparin bridge and warfarin resumed on 11/14 for h/o RLE DVT/PE.   Heparin level this morning remains therapeutic (HL 0.42 << 0.64, goal of 0.3-0.7). INR today remains SUBtherapeutic though trending up (INR 1.65 << 1.46, goal of 2-3). Will increase the warfarin dose today - continue heparin at the current rate.   The patient was re-educated on warfarin this admission.  Goal of Therapy:  Heparin level 0.3-0.7 INR 2-3 Monitor platelets by anticoagulation protocol: Yes   Plan:  1. Continue Heparin at 1100 units/hr (11 ml/hr) 2. Warfarin 7.5 mg x 1 dose at 1800 today 3. Will continue to monitor for any signs/symptoms of bleeding and will follow up with heparin level and PT/INR in the a.m.   Thank you for allowing pharmacy to be a part of this patient's  care.  Alycia Rossetti, PharmD, BCPS Clinical Pharmacist Pager: [phone] Clinical phone for 07/20/2017 from 7a-3:30p: [phone] If after 3:30p, please call main pharmacy at: [phone] 07/20/2017 1:35 PM

## 2017-07-20 NOTE — Procedures (Signed)
INR up to 1.6.  No bleeding per pt.  On HD, no vol excess. Stable, cont HD TTS while here.   I was present at this dialysis session, have reviewed the session itself and made  appropriate changes Kelly Splinter MD [place] pager [phone]   07/20/2017, 11:20 AM

## 2017-07-20 NOTE — Progress Notes (Signed)
PROGRESS NOTE   [name]  LGX:[id number]    DOB: 05/14/1961    DOA: 07/15/2017  PCP: Arnoldo Morale, MD     Brief Narrative:  56 year old male with PMH of ESRD on TTS HD, HTN, DM 2, chronic diastolic CHF, GERD, PAD, PE/right femoral DVT 04/02/17 on Coumadin managed at [place], presented to ED on 07/15/17 with rectal bleeding, INR 4.1 and hemoglobin 11.7. Supratherapeutic INR was reversed with vitamin K. Rectal bleeding has resolved. GI was consulted and recommend conservative management. Nephrology consulted for dialysis needs. Resume Coumadin with IV heparin bridging. DC home when INR therapeutic >2.   Assessment & Plan:   Active Problems:   Acute GI bleeding   Acute lower GI bleeding - In the setting of elevated INR 4.13 but source not clearly known. Supratherapeutic INR was reversed with a dose of vitamin K. Rectal bleeding has resolved. - As per [facility], dark stools that he had could be from iron supplements. -Cleared by GI to resume Coumadin with IV heparin bridge. - May need outpatient EGD once safe to hold anticoagulation. GI will arrange follow-up in 4 weeks. - Hemoglobin remained stable. - No further rectal bleeding.  History of DVT/PE 04/02/17 Anticoagulation has noted above ESRD on TTS HD Nephrology for hemodialysis and electrolytes imbalance.  Essential hypertension Controlled on losartan. Volume management across dialysis.  Nephrology discontinued amlodipine.  Anemia due to chronic kidney disease  Stable.  Type II DM with renal complications - Oral hypoglycemics held. Monitor CBGs and SSI as needed. Mildly uncontrolled. Add HS scale. May need to consider low dose Lantus if not better.     DVT prophylaxis: IV heparin bridging and Coumadin per pharmacy  Code Status: Full  Family Communication: None at bedside  Disposition: DC home when INR therapeutic >2. May take a couple of days.  Consultants:  Ne[facility]rology [facility]   Procedures:  None  Antimicrobials:  None    Subjective: Denies any abdominal pain, no shortness of breath, no fever or chills  Objective:  Vitals:   07/20/17 1000 07/20/17 1030 07/20/17 1100 07/20/17 1124  BP: 93/60 110/71 111/89 121/78  Pulse: 95 93 88 98  Resp:    18  Temp:    98 F (36.7 C)  TempSrc:    Oral  SpO2:    99%  Weight:    95 kg (209 lb 7 oz)  Height:        Examination:  General exam: No acute distress Respiratory system: Clear to auscultation. Respiratory effort normal. Stable without change. Cardiovascular system: S1 & S2 heard, RRR. No JVD, murmurs, rubs, gallops or clicks. No pedal edema. Stable. Gastrointestinal system: Abdomen is nondistended, soft and nontender. No organomegaly or masses felt. Normal bowel sounds heard. Stable without change. Central nervous system: Alert and oriented. No focal neurological deficits. Stable without change. Extremities: Symmetric 5 x 5 power. Skin: No rashes, lesions or ulcers     Data Reviewed: I have personally reviewed following labs and imaging studies  CBC: Recent Labs  Lab 07/16/17 0333 07/17/17 0423 07/18/17 0720 07/19/17 0212 07/20/17 0356  WBC 10.8* 9.7 9.3 10.7* 10.9*  HGB 10.8* 11.5* 11.1* 11.1* 10.4*  HCT 31.9* 34.1* 33.2* 32.7* 31.1*  MCV 97.6 98.0 96.2 96.5 97.8  PLT 346 367 346 348 814   Basic Metabolic Panel: Recent Labs  Lab 07/15/17 1127 07/15/17 1735 07/16/17 0333 07/20/17 0743  NA 132*  --  132* 131*  K 5.7* 6.0*  4.6 6.3*  CL 94*  --  95* 95*  CO2 24  --  23 21*  GLUCOSE 208*  --  90 151*  BUN 44*  --  50* 55*  CREATININE 10.56*  --  11.26* 10.37*  CALCIUM 8.0*  --  7.2* 7.1*  PHOS  --   --   --  6.3*   Liver Function Tests: Recent Labs  Lab 07/15/17 1127 07/16/17 0333 07/20/17 0743  AST 41 30  --   ALT 42 34  --   ALKPHOS 99 85  --   BILITOT 0.9 1.0  --   PROT 8.0 7.1  --   ALBUMIN 3.7 3.3* 3.1*   Coagulation Profile: Recent Labs  Lab 07/15/17 1356  07/17/17 0423 07/18/17 0720 07/19/17 0212 07/20/17 0356  INR 4.13* 1.70 1.43 1.46 1.65   CBG: Recent Labs  Lab 07/19/17 0746 07/19/17 1118 07/19/17 1652 07/19/17 2052 07/20/17 1200  GLUCAP 155* 220* 172* 205* 149*    Recent Results (from the past 240 hour(s))  MRSA PCR Screening     Status: None   Collection Time: 07/16/17  3:54 AM  Result Value Ref Range Status   MRSA by PCR NEGATIVE NEGATIVE Final    Comment:        The GeneXpert MRSA Assay (FDA approved for NASAL specimens only), is one component of a comprehensive MRSA colonization surveillance program. It is not intended to diagnose MRSA infection nor to guide or monitor treatment for MRSA infections.          Radiology Studies: No results found.      Scheduled Meds: . amLODipine  5 mg Oral Daily  . insulin aspart  0-5 Units Subcutaneous QHS  . insulin aspart  0-9 Units Subcutaneous TID WC  . lanthanum  1,000 mg Oral TID WC  . losartan  25 mg Oral QHS  . pantoprazole  40 mg Oral Daily  . polyethylene glycol  17 g Oral Daily  . warfarin  7.5 mg Oral ONCE-1800  . Warfarin - Pharmacist Dosing Inpatient   Does not apply q1800   Continuous Infusions: . sodium chloride    . sodium chloride    . heparin 1,100 Units/hr (07/20/17 4332)     LOS: 5 days     OSEI-BONSU,Rhaya Coale, MD, FACP, FHM. [facility] Pager [phone]  If 7PM-7AM, please contact night-coverage [email or web address] Password [email or web address] 07/20/2017, 2:16 PM

## 2017-07-21 LAB — PROTIME-INR
INR: 1.95
PROTHROMBIN TIME: 22.1 s — AB (ref 11.4–15.2)

## 2017-07-21 LAB — CBC
HCT: 30.9 % — ABNORMAL LOW (ref 39.0–52.0)
HEMOGLOBIN: 10.5 g/dL — AB (ref 13.0–17.0)
MCH: 33 pg (ref 26.0–34.0)
MCHC: 34 g/dL (ref 30.0–36.0)
MCV: 97.2 fL (ref 78.0–100.0)
Platelets: 351 10*3/uL (ref 150–400)
RBC: 3.18 MIL/uL — ABNORMAL LOW (ref 4.22–5.81)
RDW: 14.8 % (ref 11.5–15.5)
WBC: 9.1 10*3/uL (ref 4.0–10.5)

## 2017-07-21 LAB — GLUCOSE, CAPILLARY
GLUCOSE-CAPILLARY: 147 mg/dL — AB (ref 65–99)
GLUCOSE-CAPILLARY: 235 mg/dL — AB (ref 65–99)
Glucose-Capillary: 298 mg/dL — ABNORMAL HIGH (ref 65–99)
Glucose-Capillary: 393 mg/dL — ABNORMAL HIGH (ref 65–99)

## 2017-07-21 LAB — HEPARIN LEVEL (UNFRACTIONATED): Heparin Unfractionated: 0.51 IU/mL (ref 0.30–0.70)

## 2017-07-21 MED ORDER — RENA-VITE PO TABS
1.0000 | ORAL_TABLET | Freq: Every day | ORAL | Status: DC
  Administered 2017-07-21 – 2017-07-29 (×8): 1 via ORAL
  Filled 2017-07-21 (×8): qty 1

## 2017-07-21 MED ORDER — WARFARIN SODIUM 7.5 MG PO TABS
7.5000 mg | ORAL_TABLET | Freq: Once | ORAL | Status: AC
  Administered 2017-07-21: 7.5 mg via ORAL
  Filled 2017-07-21: qty 1

## 2017-07-21 NOTE — Progress Notes (Signed)
ANTICOAGULATION CONSULT NOTE -Follow up  Pharmacy Consult for heparin  Indication:  h/o DVT/PE (04/02/17)    Allergies  Allergen Reactions  . No Known Allergies    Patient Measurements: Height: 5\' 11"  (180.3 cm) Weight: 212 lb 4.8 oz (96.3 kg) IBW/kg (Calculated) : 75.3 Heparin Dosing Weight: 94kg  Vital Signs: Temp: 98.2 F (36.8 C) (11/18 1000) Temp Source: Oral (11/18 1000) BP: 122/73 (11/18 1000) Pulse Rate: 87 (11/18 1000)  Labs: Recent Labs    07/19/17 0212 07/19/17 0933 07/20/17 0356 07/20/17 0743 07/21/17 0355  HGB 11.1*  --  10.4*  --  10.5*  HCT 32.7*  --  31.1*  --  30.9*  PLT 348  --  345  --  351  LABPROT 17.6*  --  19.4*  --  22.1*  INR 1.46  --  1.65  --  1.95  HEPARINUNFRC 0.61 0.64 0.42  --  0.51  CREATININE  --   --   --  10.37*  --     Estimated Creatinine Clearance: 9.4 mL/min (A) (by C-G formula based on SCr of 10.37 mg/dL (H)).  Assessment: 39 YOM admitted with GIB. On warfarin PTA (home dose 5mg  daily)- held on admission. Admit INR elevated at 4.13- patient received vitamin K 1mg  IV x1 on 11/12 PM. Heparin bridge and warfarin resumed on 11/14 for h/o RLE DVT/PE.   Heparin level this morning remains therapeutic (HL 0.42 << 0.64, goal of 0.3-0.7). INR today remains SUBtherapeutic though trending up (INR 1.95 << 1.65, goal of 2-3). Will continue with higher warfarin dose and  continue heparin at the current rate.   INR is close to goal and may have trended up further since AM labs. MD considering discharge today.  The patient was re-educated on warfarin this admission.  Goal of Therapy:  Heparin level 0.3-0.7 INR 2-3 Monitor platelets by anticoagulation protocol: Yes   Plan:  1. Continue Heparin at 1100 units/hr (11 ml/hr) 2. Warfarin 7.5 mg x 1 dose at 1800 today 3. Upon discharge would recommend the 7.5 mg today, then to resume PTA dosing of 5 mg daily with INR recheck this week. 4. Will continue to monitor for any signs/symptoms of  bleeding and will follow up with heparin level and PT/INR in the a.m.   Thank you for allowing pharmacy to be a part of this patient's care.  Alycia Rossetti, PharmD, BCPS Clinical Pharmacist Pager: [phone] Clinical phone for 07/21/2017 from 7a-3:30p: [phone] If after 3:30p, please call main pharmacy at: [phone] 07/21/2017 1:12 PM

## 2017-07-21 NOTE — Progress Notes (Signed)
Pt stated he just had a small BM with bright red blood in stool. Pt is here for a GI bleed and bleeding had resolved until now. Pt is also on Heparin gtt at 46mL/hr. Messaged NP to inform and to see if rate needs to be decreased. Pt does have a Hx of DVT/PE. Awaiting response.   Eleanora Neighbor, RN

## 2017-07-21 NOTE — Progress Notes (Signed)
PROGRESS NOTE    [name]  YTK:[id number] DOB: 10-Oct-1960 DOA: 07/15/2017 PCP: Arnoldo Morale, MD  Brief Narrative:56 year old male with PMH of ESRD on TTS HD, HTN, DM 2, chronic diastolic CHF, GERD, PAD, PE/right femoral DVT 04/02/17 on Coumadin managed at [place], presented to ED on 07/15/17 with rectal bleeding, INR 4.1 and hemoglobin 11.7. Supratherapeutic INR was reversed with vitamin K. Rectal bleeding has resolved. GI was consulted and recommend conservative management. Nephrology consulted for dialysis needs. Resume Coumadin with IV heparin bridging. DC home when INR therapeutic ABOVE 2.   Assessment & Plan:   Active Problems:   Acute GI bleeding   Hyperkalemia Acute lower GI bleeding - In the setting of elevated INR 4.13 but source not clearly known. Supratherapeutic INR was reversed with a dose of vitamin K. Rectal bleeding has resolved. - As per [facility], dark stools that he had could be from iron supplements. -Cleared by GI to resume Coumadin with IV heparin bridge. - May need outpatient EGD once safe to hold anticoagulation. GI will arrange follow-up in 4 weeks. - Hemoglobin remained stable. - No further rectal bleeding. DVT/PE 04/02/17-ON COUMADIN  ESRD TTH SAT HD  HTN-CONTINUE LOSARTAN  DM Restart glipizide.   DVT prophylaxiS coumadin :Code StatuS full Family Communication none :Disposition Plan: plan dc tomorrow once inr above 2. Consultants: nephro  Procedures none Antimicrobials none  Subjective:no new complaints.no nausea,vomiting   Objective: Vitals:   07/20/17 1700 07/20/17 2034 07/21/17 0548 07/21/17 1000  BP: 113/88 112/84 116/61 122/73  Pulse: 91 98 88 87  Resp: 18 16 14 16   Temp: 98.2 F (36.8 C) 98.9 F (37.2 C) 98.4 F (36.9 C) 98.2 F (36.8 C)  TempSrc: Oral Oral Oral Oral  SpO2: 98% 100% 99% 98%  Weight:  96.3 kg (212 lb 4.8 oz)    Height:        Intake/Output Summary (Last 24 hours) at  07/21/2017 1128 Last data filed at 07/21/2017 0900 Gross per 24 hour  Intake 1080 ml  Output 300 ml  Net 780 ml   Filed Weights   07/20/17 0745 07/20/17 1124 07/20/17 2034  Weight: 97 kg (213 lb 13.5 oz) 95 kg (209 lb 7 oz) 96.3 kg (212 lb 4.8 oz)    Examination:  General exam: Appears calm and comfortable  Respiratory system: Clear to auscultation. Respiratory effort normal. Cardiovascular system: S1 & S2 heard, RRR. No JVD, murmurs, rubs, gallops or clicks. No pedal edema. Gastrointestinal system: Abdomen is nondistended, soft and nontender. No organomegaly or masses felt. Normal bowel sounds heard. Central nervous system: Alert and oriented. No focal neurological deficits. Extremities: Symmetric 5 x 5 power. Skin: No rashes, lesions or ulcers Psychiatry: Judgement and insight appear normal. Mood & affect appropriate.     Data Reviewed: I have personally reviewed following labs and imaging studies  CBC: Recent Labs  Lab 07/17/17 0423 07/18/17 0720 07/19/17 0212 07/20/17 0356 07/21/17 0355  WBC 9.7 9.3 10.7* 10.9* 9.1  HGB 11.5* 11.1* 11.1* 10.4* 10.5*  HCT 34.1* 33.2* 32.7* 31.1* 30.9*  MCV 98.0 96.2 96.5 97.8 97.2  PLT 367 346 348 345 557   Basic Metabolic Panel: Recent Labs  Lab 07/15/17 1127 07/15/17 1735 07/16/17 0333 07/20/17 0743  NA 132*  --  132* 131*  K 5.7* 6.0* 4.6 6.3*  CL 94*  --  95* 95*  CO2 24  --  23 21*  GLUCOSE 208*  --  90 151*  BUN  44*  --  50* 55*  CREATININE 10.56*  --  11.26* 10.37*  CALCIUM 8.0*  --  7.2* 7.1*  PHOS  --   --   --  6.3*   GFR: Estimated Creatinine Clearance: 9.4 mL/min (A) (by C-G formula based on SCr of 10.37 mg/dL (H)). Liver Function Tests: Recent Labs  Lab 07/15/17 1127 07/16/17 0333 07/20/17 0743  AST 41 30  --   ALT 42 34  --   ALKPHOS 99 85  --   BILITOT 0.9 1.0  --   PROT 8.0 7.1  --   ALBUMIN 3.7 3.3* 3.1*   No results for input(s): LIPASE, AMYLASE in the last 168 hours. No results for  input(s): AMMONIA in the last 168 hours. Coagulation Profile: Recent Labs  Lab 07/17/17 0423 07/18/17 0720 07/19/17 0212 07/20/17 0356 07/21/17 0355  INR 1.70 1.43 1.46 1.65 1.95   Cardiac Enzymes: No results for input(s): CKTOTAL, CKMB, CKMBINDEX, TROPONINI in the last 168 hours. BNP (last 3 results) No results for input(s): PROBNP in the last 8760 hours. HbA1C: No results for input(s): HGBA1C in the last 72 hours. CBG: Recent Labs  Lab 07/19/17 2052 07/20/17 1200 07/20/17 1637 07/20/17 2115 07/21/17 0809  GLUCAP 205* 149* 191* 269* 147*   Lipid Profile: No results for input(s): CHOL, HDL, LDLCALC, TRIG, CHOLHDL, LDLDIRECT in the last 72 hours. Thyroid Function Tests: No results for input(s): TSH, T4TOTAL, FREET4, T3FREE, THYROIDAB in the last 72 hours. Anemia Panel: No results for input(s): VITAMINB12, FOLATE, FERRITIN, TIBC, IRON, RETICCTPCT in the last 72 hours. Sepsis Labs: No results for input(s): PROCALCITON, LATICACIDVEN in the last 168 hours.  Recent Results (from the past 240 hour(s))  MRSA PCR Screening     Status: None   Collection Time: 07/16/17  3:54 AM  Result Value Ref Range Status   MRSA by PCR NEGATIVE NEGATIVE Final    Comment:        The GeneXpert MRSA Assay (FDA approved for NASAL specimens only), is one component of a comprehensive MRSA colonization surveillance program. It is not intended to diagnose MRSA infection nor to guide or monitor treatment for MRSA infections.          Radiology Studies: No results found.      Scheduled Meds: . amLODipine  5 mg Oral Daily  . insulin aspart  0-5 Units Subcutaneous QHS  . insulin aspart  0-9 Units Subcutaneous TID WC  . lanthanum  1,000 mg Oral TID WC  . losartan  25 mg Oral QHS  . pantoprazole  40 mg Oral Daily  . polyethylene glycol  17 g Oral Daily  . Warfarin - Pharmacist Dosing Inpatient   Does not apply q1800   Continuous Infusions: . heparin 1,100 Units/hr (07/20/17 2343)      LOS: 6 days      Georgette Shell, MD [facility] If 7PM-7AM, please contact night-coverage [email or web address] Password [email or web address] 07/21/2017, 11:28 AM

## 2017-07-21 NOTE — Progress Notes (Signed)
Subjective:  No cos  tolerated hd yesterday   Objective Vital signs in last 24 hours: Vitals:   07/20/17 1700 07/20/17 2034 07/21/17 0548 07/21/17 1000  BP: 113/88 112/84 116/61 122/73  Pulse: 91 98 88 87  Resp: 18 16 14 16   Temp: 98.2 F (36.8 C) 98.9 F (37.2 C) 98.4 F (36.9 C) 98.2 F (36.8 C)  TempSrc: Oral Oral Oral Oral  SpO2: 98% 100% 99% 98%  Weight:  96.3 kg (212 lb 4.8 oz)    Height:       Weight change: 1.4 kg (3 lb 1.4 oz)  Physical Exam: General: Pleasant, alert, OX3  NAD Heart:  RRR,  No M/G/R Lungs: CTA Bilt  A/P Abdomen:  Bs pos. ,Soft NT, ND Extremities: trace bipedal  edema Dialysis Access: RUA AVF + bruit  Dialysis Orders:[facility]S [facility] 4h 92kg 2/2.5 Hep 6000 RUA AVF -Hectorol 1 mcg IV TIW -No ESA/Venofer    Problem/Plan: 1. GIB: withSupra-therapeutic INR on adm././ GI consulted. Conservative treatment for now. HGB  stable 11.5>11.1>>11.1>10.4  Yesterday  No bloody stools now//, Rec'd Vit K. Initially Coumadin  held. RX =Per primary. 2. H/O DVT/PE: per primary /  back on Coumadin  per primary  3. ESRD -TTS HD. HD in am  holiday sch =m.w.sat 4. Hypertension/volume - BP well controlled  . On  Losartan 25 mg PO q hs , amlodipine 5 mg PO daily/  4kg > edw ,states " usually tolerates 4- 5 kg uf as op""  No evidence of volume overload. Now eating better  Attempt uf on hd tomor  5. Anemia - yest 10.4  and stable, no ESA needs /Not on as OP 6. Metabolic bone disease -  yest Corec ca 7.8, / phos 6.3 yest    On sensipar 60 mg as op ,  dc 'd with low ca,  Use 2.5 ca bath in am, fu ca/phos ,op binder=Fosrenol ,Continue Hec on hd . 7. Nutrition - . Albumin 3.7>3.3.>3.1  Eating better / renal vit / may need supplement fu am alb  8. DM: Per primary. 9. Disposition: Stable . Per primary. HD  In am    Ernest Haber, PA-C [place] [phone] 07/21/2017,12:18 PM  LOS: 6 days   Pt seen, examined and agree w A/P as above.  Kelly Splinter MD [facility] pager [phone]   07/21/2017, 3:25 PM    Labs: Basic Metabolic Panel: Recent Labs  Lab 07/15/17 1127 07/15/17 1735 07/16/17 0333 07/20/17 0743  NA 132*  --  132* 131*  K 5.7* 6.0* 4.6 6.3*  CL 94*  --  95* 95*  CO2 24  --  23 21*  GLUCOSE 208*  --  90 151*  BUN 44*  --  50* 55*  CREATININE 10.56*  --  11.26* 10.37*  CALCIUM 8.0*  --  7.2* 7.1*  PHOS  --   --   --  6.3*   Liver Function Tests: Recent Labs  Lab 07/15/17 1127 07/16/17 0333 07/20/17 0743  AST 41 30  --   ALT 42 34  --   ALKPHOS 99 85  --   BILITOT 0.9 1.0  --   PROT 8.0 7.1  --   ALBUMIN 3.7 3.3* 3.1*   No results for input(s): LIPASE, AMYLASE in the last 168 hours. No results for input(s): AMMONIA in the last 168 hours. CBC: Recent Labs  Lab 07/17/17 0423 07/18/17 0720 07/19/17 0212 07/20/17 0356 07/21/17 0355  WBC 9.7  9.3 10.7* 10.9* 9.1  HGB 11.5* 11.1* 11.1* 10.4* 10.5*  HCT 34.1* 33.2* 32.7* 31.1* 30.9*  MCV 98.0 96.2 96.5 97.8 97.2  PLT 367 346 348 345 351   Cardiac Enzymes: No results for input(s): CKTOTAL, CKMB, CKMBINDEX, TROPONINI in the last 168 hours. CBG: Recent Labs  Lab 07/20/17 1200 07/20/17 1637 07/20/17 2115 07/21/17 0809 07/21/17 1133  GLUCAP 149* 191* 269* 147* 298*    Studies/Results: No results found. Medications: . heparin 1,100 Units/hr (07/20/17 2343)   . amLODipine  5 mg Oral Daily  . insulin aspart  0-5 Units Subcutaneous QHS  . insulin aspart  0-9 Units Subcutaneous TID WC  . lanthanum  1,000 mg Oral TID WC  . losartan  25 mg Oral QHS  . pantoprazole  40 mg Oral Daily  . polyethylene glycol  17 g Oral Daily  . Warfarin - Pharmacist Dosing Inpatient   Does not apply [phone]

## 2017-07-22 LAB — RENAL FUNCTION PANEL
ALBUMIN: 3.2 g/dL — AB (ref 3.5–5.0)
ANION GAP: 12 (ref 5–15)
BUN: 55 mg/dL — ABNORMAL HIGH (ref 6–20)
CO2: 22 mmol/L (ref 22–32)
Calcium: 7.6 mg/dL — ABNORMAL LOW (ref 8.9–10.3)
Chloride: 101 mmol/L (ref 101–111)
Creatinine, Ser: 10.24 mg/dL — ABNORMAL HIGH (ref 0.61–1.24)
GFR calc Af Amer: 6 mL/min — ABNORMAL LOW (ref >60)
GFR calc non Af Amer: 5 mL/min — ABNORMAL LOW (ref >60)
GLUCOSE: 176 mg/dL — AB (ref 65–99)
PHOSPHORUS: 5.6 mg/dL — AB (ref 2.5–4.6)
POTASSIUM: 5 mmol/L (ref 3.5–5.1)
SODIUM: 135 mmol/L (ref 135–145)

## 2017-07-22 LAB — CBC
HCT: 29 % — ABNORMAL LOW (ref 39.0–52.0)
HEMOGLOBIN: 9.6 g/dL — AB (ref 13.0–17.0)
MCH: 32.3 pg (ref 26.0–34.0)
MCHC: 33.1 g/dL (ref 30.0–36.0)
MCV: 97.6 fL (ref 78.0–100.0)
PLATELETS: 347 10*3/uL (ref 150–400)
RBC: 2.97 MIL/uL — AB (ref 4.22–5.81)
RDW: 14.7 % (ref 11.5–15.5)
WBC: 8.7 10*3/uL (ref 4.0–10.5)

## 2017-07-22 LAB — PROTIME-INR
INR: 2.34
PROTHROMBIN TIME: 25.4 s — AB (ref 11.4–15.2)

## 2017-07-22 LAB — GLUCOSE, CAPILLARY
Glucose-Capillary: 208 mg/dL — ABNORMAL HIGH (ref 65–99)
Glucose-Capillary: 286 mg/dL — ABNORMAL HIGH (ref 65–99)
Glucose-Capillary: 196 mg/dL — ABNORMAL HIGH (ref 65–99)

## 2017-07-22 LAB — HEPARIN LEVEL (UNFRACTIONATED): Heparin Unfractionated: 0.26 IU/mL — ABNORMAL LOW (ref 0.30–0.70)

## 2017-07-22 MED ORDER — PEG 3350-KCL-NA BICARB-NACL 420 G PO SOLR
4000.0000 mL | Freq: Once | ORAL | Status: AC
  Administered 2017-07-23: 4000 mL via ORAL
  Filled 2017-07-22: qty 4000

## 2017-07-22 MED ORDER — DARBEPOETIN ALFA 60 MCG/0.3ML IJ SOSY
PREFILLED_SYRINGE | INTRAMUSCULAR | Status: AC
Start: 2017-07-22 — End: 2017-07-22
  Administered 2017-07-22: 60 ug via INTRAVENOUS
  Filled 2017-07-22: qty 0.3

## 2017-07-22 MED ORDER — DARBEPOETIN ALFA 60 MCG/0.3ML IJ SOSY
60.0000 ug | PREFILLED_SYRINGE | INTRAMUSCULAR | Status: DC
  Administered 2017-07-22: 60 ug via INTRAVENOUS
  Filled 2017-07-22: qty 0.3

## 2017-07-22 MED ORDER — DOXERCALCIFEROL 4 MCG/2ML IV SOLN
INTRAVENOUS | Status: AC
  Administered 2017-07-22: 1 ug via INTRAVENOUS
  Filled 2017-07-22: qty 2

## 2017-07-22 MED ORDER — DOXERCALCIFEROL 4 MCG/2ML IV SOLN
1.0000 ug | INTRAVENOUS | Status: DC
  Administered 2017-07-22 – 2017-07-24 (×2): 1 ug via INTRAVENOUS
  Filled 2017-07-22 (×2): qty 2

## 2017-07-22 MED ORDER — WARFARIN SODIUM 5 MG PO TABS
5.0000 mg | ORAL_TABLET | Freq: Once | ORAL | Status: DC
  Filled 2017-07-22: qty 1

## 2017-07-22 NOTE — Progress Notes (Signed)
ANTICOAGULATION CONSULT NOTE -Follow up  Pharmacy Consult for heparin  Indication:  h/o DVT/PE (04/02/17)    Allergies  Allergen Reactions  . No Known Allergies    Patient Measurements: Height: 5\' 11"  (180.3 cm) Weight: 208 lb 12.4 oz (94.7 kg) IBW/kg (Calculated) : 75.3 Heparin Dosing Weight: 94kg  Vital Signs: Temp: 97.3 F (36.3 C) (11/19 1156) Temp Source: Oral (11/19 1156) BP: 114/68 (11/19 1156) Pulse Rate: 94 (11/19 1156)  Labs: Recent Labs    07/20/17 0356 07/20/17 0743 07/21/17 0355 07/22/17 0508 07/22/17 0745  HGB 10.4*  --  10.5* 9.6*  --   HCT 31.1*  --  30.9* 29.0*  --   PLT 345  --  351 347  --   LABPROT 19.4*  --  22.1* 25.4*  --   INR 1.65  --  1.95 2.34  --   HEPARINUNFRC 0.42  --  0.51 0.26*  --   CREATININE  --  10.37*  --   --  10.24*    Estimated Creatinine Clearance: 9.5 mL/min (A) (by C-G formula based on SCr of 10.24 mg/dL (H)).  Assessment: 56 yo M admitted with GIB. On warfarin PTA (home dose 5mg  daily) - held on admission. Admit INR elevated at 4.13 - patient received vitamin K 1mg  IV x1 on 11/12 PM. Heparin bridge and warfarin resumed on 11/14 for h/o RLE DVT/PE.   INR is therapeutic.  Will discontinue heparin and associated labs.  Noted BRB in stools per RN notes.  Await MD eval with regards to plan.    Addendum:   Pt has now been seen by GI who recommends holding warfarin with plans for colonoscopy on 11/21.  Plan to bridge with heparin when INR <2.  Stop heparin 6 hours prior to colonoscopy.  The patient was re-educated on warfarin this admission.  Goal of Therapy:  INR 2-3 Monitor platelets by anticoagulation protocol: Yes   Plan:  Discontinue warfarin.  Start heparin when INR <2. Check INR in AM.  Will continue to monitor for any signs/symptoms of bleeding.  Thank you for allowing pharmacy to be a part of this patient's care.  [employer], Pharm.D., BCPS Clinical Pharmacist Pager: [phone] Clinical phone for  07/22/2017 from 8:30-4:00 is [phone]. After 4pm, please call Main Rx [phone]) for assistance. 07/22/2017 3:24 PM

## 2017-07-22 NOTE — Progress Notes (Signed)
PROGRESS NOTE    [name]  UTM:[id number] DOB: 11/10/1960 DOA: 07/15/2017 PCP: Arnoldo Morale, MD  Brief Narrative54 year old male with PMH of ESRD on TTS HD, HTN, DM 2, chronic diastolic CHF, GERD, PAD, PE/right femoral DVT 04/02/17 on Coumadin managed at [place], presented to ED on 07/15/17 with rectal bleeding, INR 4.1 and hemoglobin 11.7. Supratherapeutic INR was reversed with vitamin K. Rectal bleeding has resolved. GI was consulted and recommend conservative management. Nephrology consulted for dialysis needs. Resume Coumadin with IV heparin bridging. DC home when INR therapeutic ABOVE 2.   Assessment & Plan:   Active Problems:   Acute GI bleeding   Hyperkalemia  Acute lower GI bleeding - In the setting of elevated INR 4.13 but source not clearly known. Supratherapeutic INR was reversed with a dose of vitamin K. Rectal bleeding has resolved. -Patient had an episode of bright red bleeding per rectum with stool and on the toilet paper with significant amount last night.  He did have another bowel movement today which was not bloody.  GI was reconsulted.  Appreciate DrKarkis  input.  Patient to have colonoscopy most likely Wednesday once the INR is less than 1.6 pharmacy to.  Bridge heparin and DC heparin 6 hours prior to colonoscopy.  For colonoscopy.  Coumadin to be held continue Protonix DVT/PE 04/02/17-ON HEPARIN.  COUMADIN ON HOLD  ESRD TTH SAT HD  HTN-CONTINUE LOSARTAN  DM Restart glipizide    DVT prophylaxis: Heparin Code Status: Full code Family Communication: None  disposition Plan: TBD  Consultants: GI  Procedures: None done Antimicrobials:   Subjective: Had bloody bowel movement last night.  Had a normal bowel movement today.   Objective: Sitting with a side of the pad in no acute distress. Vitals:   07/22/17 1100 07/22/17 1130 07/22/17 1145 07/22/17 1156  BP: 91/65 (!) 102/58 (!) 91/51 114/68  Pulse: (!) 108 (!)  114 85 94  Resp:    16  Temp:    (!) 97.3 F (36.3 C)  TempSrc:    Oral  SpO2:    98%  Weight:    94.7 kg (208 lb 12.4 oz)  Height:        Intake/Output Summary (Last 24 hours) at 07/22/2017 1349 Last data filed at 07/22/2017 1156 Gross per 24 hour  Intake 360 ml  Output 4400 ml  Net -4040 ml   Filed Weights   07/20/17 2034 07/22/17 0743 07/22/17 1156  Weight: 96.3 kg (212 lb 4.8 oz) 98.2 kg (216 lb 7.9 oz) 94.7 kg (208 lb 12.4 oz)    Examination:  General exam: Appears calm and comfortable  Respiratory system: Clear to auscultation. Respiratory effort normal. Cardiovascular system: S1 & S2 heard, RRR. No JVD, murmurs, rubs, gallops or clicks. No pedal edema. Gastrointestinal system: Abdomen is nondistended, soft and nontender. No organomegaly or masses felt. Normal bowel sounds heard. Central nervous system: Alert and oriented. No focal neurological deficits. Extremities: Symmetric 5 x 5 power. Skin: No rashes, lesions or ulcers Psychiatry: Judgement and insight appear normal. Mood & affect appropriate.     Data Reviewed: I have personally reviewed following labs and imaging studies  CBC: Recent Labs  Lab 07/18/17 0720 07/19/17 0212 07/20/17 0356 07/21/17 0355 07/22/17 0508  WBC 9.3 10.7* 10.9* 9.1 8.7  HGB 11.1* 11.1* 10.4* 10.5* 9.6*  HCT 33.2* 32.7* 31.1* 30.9* 29.0*  MCV 96.2 96.5 97.8 97.2 97.6  PLT 346 348 345 351 568   Basic Metabolic Panel: Recent  Labs  Lab 07/15/17 1735 07/16/17 0333 07/20/17 0743 07/22/17 0745  NA  --  132* 131* 135  K 6.0* 4.6 6.3* 5.0  CL  --  95* 95* 101  CO2  --  23 21* 22  GLUCOSE  --  90 151* 176*  BUN  --  50* 55* 55*  CREATININE  --  11.26* 10.37* 10.24*  CALCIUM  --  7.2* 7.1* 7.6*  PHOS  --   --  6.3* 5.6*   GFR: Estimated Creatinine Clearance: 9.5 mL/min (A) (by C-G formula based on SCr of 10.24 mg/dL (H)). Liver Function Tests: Recent Labs  Lab 07/16/17 0333 07/20/17 0743 07/22/17 0745  AST 30  --   --    ALT 34  --   --   ALKPHOS 85  --   --   BILITOT 1.0  --   --   PROT 7.1  --   --   ALBUMIN 3.3* 3.1* 3.2*   No results for input(s): LIPASE, AMYLASE in the last 168 hours. No results for input(s): AMMONIA in the last 168 hours. Coagulation Profile: Recent Labs  Lab 07/18/17 0720 07/19/17 0212 07/20/17 0356 07/21/17 0355 07/22/17 0508  INR 1.43 1.46 1.65 1.95 2.34   Cardiac Enzymes: No results for input(s): CKTOTAL, CKMB, CKMBINDEX, TROPONINI in the last 168 hours. BNP (last 3 results) No results for input(s): PROBNP in the last 8760 hours. HbA1C: No results for input(s): HGBA1C in the last 72 hours. CBG: Recent Labs  Lab 07/21/17 0809 07/21/17 1133 07/21/17 1703 07/21/17 2126 07/22/17 1241  GLUCAP 147* 298* 235* 393* 208*   Lipid Profile: No results for input(s): CHOL, HDL, LDLCALC, TRIG, CHOLHDL, LDLDIRECT in the last 72 hours. Thyroid Function Tests: No results for input(s): TSH, T4TOTAL, FREET4, T3FREE, THYROIDAB in the last 72 hours. Anemia Panel: No results for input(s): VITAMINB12, FOLATE, FERRITIN, TIBC, IRON, RETICCTPCT in the last 72 hours. Sepsis Labs: No results for input(s): PROCALCITON, LATICACIDVEN in the last 168 hours.  Recent Results (from the past 240 hour(s))  MRSA PCR Screening     Status: None   Collection Time: 07/16/17  3:54 AM  Result Value Ref Range Status   MRSA by PCR NEGATIVE NEGATIVE Final    Comment:        The GeneXpert MRSA Assay (FDA approved for NASAL specimens only), is one component of a comprehensive MRSA colonization surveillance program. It is not intended to diagnose MRSA infection nor to guide or monitor treatment for MRSA infections.          Radiology Studies: No results found.      Scheduled Meds: . darbepoetin (ARANESP) injection - DIALYSIS  60 mcg Intravenous Q Mon-HD  . doxercalciferol  1 mcg Intravenous Q M,W,F-HD  . insulin aspart  0-5 Units Subcutaneous QHS  . insulin aspart  0-9 Units  Subcutaneous TID WC  . lanthanum  1,000 mg Oral TID WC  . losartan  25 mg Oral QHS  . multivitamin  1 tablet Oral QHS  . pantoprazole  40 mg Oral Daily  . polyethylene glycol  17 g Oral Daily  . [START ON 07/23/2017] polyethylene glycol-electrolytes  4,000 mL Oral Once  . warfarin  5 mg Oral ONCE-1800  . Warfarin - Pharmacist Dosing Inpatient   Does not apply q1800   Continuous Infusions:   LOS: 7 days       Georgette Shell, MD [facility]  If 7PM-7AM, please contact night-coverage [email or web address] Password [email or web address] 07/22/2017, 1:49 PM

## 2017-07-22 NOTE — Procedures (Signed)
Patient was seen on dialysis and the procedure was supervised.  BFR 350  Via AVF BP is  117/74.   Patient appears to be tolerating treatment well- goal 4000- have stopped BP med   [name] A 07/22/2017

## 2017-07-22 NOTE — Progress Notes (Signed)
ANTICOAGULATION CONSULT NOTE -Follow up  Pharmacy Consult for heparin  Indication:  h/o DVT/PE (04/02/17)    Allergies  Allergen Reactions  . No Known Allergies    Patient Measurements: Height: 5\' 11"  (180.3 cm) Weight: 216 lb 7.9 oz (98.2 kg) IBW/kg (Calculated) : 75.3 Heparin Dosing Weight: 94kg  Vital Signs: Temp: 98.4 F (36.9 C) (11/19 0743) Temp Source: Oral (11/19 0743) BP: 109/64 (11/19 1030) Pulse Rate: 102 (11/19 1030)  Labs: Recent Labs    07/20/17 0356 07/20/17 0743 07/21/17 0355 07/22/17 0508 07/22/17 0745  HGB 10.4*  --  10.5* 9.6*  --   HCT 31.1*  --  30.9* 29.0*  --   PLT 345  --  351 347  --   LABPROT 19.4*  --  22.1* 25.4*  --   INR 1.65  --  1.95 2.34  --   HEPARINUNFRC 0.42  --  0.51 0.26*  --   CREATININE  --  10.37*  --   --  10.24*    Estimated Creatinine Clearance: 9.6 mL/min (A) (by C-G formula based on SCr of 10.24 mg/dL (H)).  Assessment: 70 YOM admitted with GIB. On warfarin PTA (home dose 5mg  daily)- held on admission. Admit INR elevated at 4.13- patient received vitamin K 1mg  IV x1 on 11/12 PM. Heparin bridge and warfarin resumed on 11/14 for h/o RLE DVT/PE.   INR is therapeutic.  Will discontinue heparin and associated labs.  Noted BRB in stools per RN notes.  Await MD eval with regards to plan.    The patient was re-educated on warfarin this admission.  Goal of Therapy:  INR 2-3 Monitor platelets by anticoagulation protocol: Yes   Plan:  Discontinue heparin and associated labs Warfarin 5 mg x 1 dose at 1800 today Will continue to monitor for any signs/symptoms of bleeding and will follow up with PT/INR in the a.m.   Thank you for allowing pharmacy to be a part of this patient's care.  [employer], Pharm.D., BCPS Clinical Pharmacist Pager: [phone] Clinical phone for 07/22/2017 from 8:30-4:00 is [phone]. After 4pm, please call Main Rx [phone]) for assistance. 07/22/2017 10:40 AM

## 2017-07-22 NOTE — H&P (View-Only) (Signed)
Subjective: Was seen and examined at bedside. Reports an episode of bright red blood per rectum with stool and on toilet paper, significant amount yesterday. Was seen as a consult by Dr. Brahmbhatt on 07/16/2017. Colonoscopy from March 2018 showed fair prep and 2 small tubular adenoma. Patient reports intermittent dark stool, which is now resolved, but needs to be on Coumadin for PE and DVT. Hemoglobin dropped from 11.1-10.4/10.5 and is 9.6 today.   Objective: Vital signs in last 24 hours: Temp:  [97.3 F (36.3 C)-98.9 F (37.2 C)] 97.3 F (36.3 C) (11/19 1156) Pulse Rate:  [83-114] 94 (11/19 1156) Resp:  [16-18] 16 (11/19 1156) BP: (91-142)/(51-92) 114/68 (11/19 1156) SpO2:  [96 %-99 %] 98 % (11/19 1156) Weight:  [94.7 kg (208 lb 12.4 oz)-98.2 kg (216 lb 7.9 oz)] 94.7 kg (208 lb 12.4 oz) (11/19 1156) Weight change:  Last BM Date: 07/20/17  PE: Not in acute distress GENERAL: Mild pallor, no icterus ABDOMEN: Soft, nondistended, nontender, normoactive bowel sounds EXTREMITIES: No edema  Lab Results: Results for orders placed or performed during the hospital encounter of 07/15/17 (from the past 48 hour(s))  Glucose, capillary     Status: Abnormal   Collection Time: 07/20/17  4:37 PM  Result Value Ref Range   Glucose-Capillary 191 (H) 65 - 99 mg/dL  Glucose, capillary     Status: Abnormal   Collection Time: 07/20/17  9:15 PM  Result Value Ref Range   Glucose-Capillary 269 (H) 65 - 99 mg/dL  CBC     Status: Abnormal   Collection Time: 07/21/17  3:55 AM  Result Value Ref Range   WBC 9.1 4.0 - 10.5 K/uL   RBC 3.18 (L) 4.22 - 5.81 MIL/uL   Hemoglobin 10.5 (L) 13.0 - 17.0 g/dL   HCT 30.9 (L) 39.0 - 52.0 %   MCV 97.2 78.0 - 100.0 fL   MCH 33.0 26.0 - 34.0 pg   MCHC 34.0 30.0 - 36.0 g/dL   RDW 14.8 11.5 - 15.5 %   Platelets 351 150 - 400 K/uL  Protime-INR     Status: Abnormal   Collection Time: 07/21/17  3:55 AM  Result Value Ref Range   Prothrombin Time 22.1 (H) 11.4 - 15.2  seconds   INR 1.95   Heparin level (unfractionated)     Status: None   Collection Time: 07/21/17  3:55 AM  Result Value Ref Range   Heparin Unfractionated 0.51 0.30 - 0.70 IU/mL    Comment:        IF HEPARIN RESULTS ARE BELOW EXPECTED VALUES, AND PATIENT DOSAGE HAS BEEN CONFIRMED, SUGGEST FOLLOW UP TESTING OF ANTITHROMBIN III LEVELS.   Glucose, capillary     Status: Abnormal   Collection Time: 07/21/17  8:09 AM  Result Value Ref Range   Glucose-Capillary 147 (H) 65 - 99 mg/dL  Glucose, capillary     Status: Abnormal   Collection Time: 07/21/17 11:33 AM  Result Value Ref Range   Glucose-Capillary 298 (H) 65 - 99 mg/dL  Glucose, capillary     Status: Abnormal   Collection Time: 07/21/17  5:03 PM  Result Value Ref Range   Glucose-Capillary 235 (H) 65 - 99 mg/dL  Glucose, capillary     Status: Abnormal   Collection Time: 07/21/17  9:26 PM  Result Value Ref Range   Glucose-Capillary 393 (H) 65 - 99 mg/dL  CBC     Status: Abnormal   Collection Time: 07/22/17  5:08 AM  Result Value Ref Range     WBC 8.7 4.0 - 10.5 K/uL   RBC 2.97 (L) 4.22 - 5.81 MIL/uL   Hemoglobin 9.6 (L) 13.0 - 17.0 g/dL   HCT 29.0 (L) 39.0 - 52.0 %   MCV 97.6 78.0 - 100.0 fL   MCH 32.3 26.0 - 34.0 pg   MCHC 33.1 30.0 - 36.0 g/dL   RDW 14.7 11.5 - 15.5 %   Platelets 347 150 - 400 K/uL  Protime-INR     Status: Abnormal   Collection Time: 07/22/17  5:08 AM  Result Value Ref Range   Prothrombin Time 25.4 (H) 11.4 - 15.2 seconds   INR 2.34   Heparin level (unfractionated)     Status: Abnormal   Collection Time: 07/22/17  5:08 AM  Result Value Ref Range   Heparin Unfractionated 0.26 (L) 0.30 - 0.70 IU/mL    Comment:        IF HEPARIN RESULTS ARE BELOW EXPECTED VALUES, AND PATIENT DOSAGE HAS BEEN CONFIRMED, SUGGEST FOLLOW UP TESTING OF ANTITHROMBIN III LEVELS.   Renal function panel     Status: Abnormal   Collection Time: 07/22/17  7:45 AM  Result Value Ref Range   Sodium 135 135 - 145 mmol/L    Potassium 5.0 3.5 - 5.1 mmol/L   Chloride 101 101 - 111 mmol/L   CO2 22 22 - 32 mmol/L   Glucose, Bld 176 (H) 65 - 99 mg/dL   BUN 55 (H) 6 - 20 mg/dL   Creatinine, Ser 10.24 (H) 0.61 - 1.24 mg/dL   Calcium 7.6 (L) 8.9 - 10.3 mg/dL   Phosphorus 5.6 (H) 2.5 - 4.6 mg/dL   Albumin 3.2 (L) 3.5 - 5.0 g/dL   GFR calc non Af Amer 5 (L) >60 mL/min   GFR calc Af Amer 6 (L) >60 mL/min    Comment: (NOTE) The eGFR has been calculated using the CKD EPI equation. This calculation has not been validated in all clinical situations. eGFR's persistently <60 mL/min signify possible Chronic Kidney Disease.    Anion gap 12 5 - 15  Glucose, capillary     Status: Abnormal   Collection Time: 07/22/17 12:41 PM  Result Value Ref Range   Glucose-Capillary 208 (H) 65 - 99 mg/dL    Studies/Results: No results found.  Medications: I have reviewed the patient's current medications.  Assessment: 1. Hematochezia, intermittent dark stools, drop in hemoglobin, needs to be on anticoagulation for DVT PE, FOBT positive on 07/15/17 2. End-stage renal disease on hemodialysis  Plan: 1. On Protonix 40 mg by mouth daily. 2. Plan was to start Coumadin 5 mg from today at 6 PM after being on IV heparin.  Recommend hold Coumadin, okay to bridge with heparin, which needs to be on hold 6 hours before a planned colonoscopy on Wednesday 07/24/17.  Possibility of hemorrhoidal bleed, AVM, less likely malignancy. If colonoscopy is unremarkable, may need an EGD as patient needs to be on anticoagulation at least for the next several months for recent DVT and PE. Will start patient on clear liquid diet, start colonic prep tomorrow afternoon, plan a colonoscopy on Wednesday, follow-up PT/INR in a.m., recommend INR to be 1.6 or less for diagnostic and possible therapeutic colonoscopy on Wednesday. Hemodynamically stable.   Colin Norment 07/22/2017, 1:32 PM   Pager [phone] If no answer or after 5 PM call [phone] 

## 2017-07-22 NOTE — Progress Notes (Signed)
Subjective:  Seen on HD - reported a little more bleeding- hgb down to 9.6 this AM  Objective Vital signs in last 24 hours: Vitals:   07/22/17 0815 07/22/17 0845 07/22/17 0900 07/22/17 0930  BP: 122/78 122/69 (!) 137/92 119/75  Pulse: 89 89 98 93  Resp:      Temp:      TempSrc:      SpO2:      Weight:      Height:       Weight change:   Physical Exam: General: Pleasant, alert, OX3  NAD Heart:  RRR,  No M/G/R Lungs: CTA Bilt  A/P Abdomen:  Bs pos. ,Soft NT, ND Extremities: trace bipedal  edema Dialysis Access: RUA AVF + bruit  Dialysis Orders:TTS [facility] 4h 92kg 2/2.5 Hep 6000 RUA AVF -Hectorol 1 mcg IV TIW -No ESA/Venofer    Problem/Plan: 1. GIB: withSupra-therapeutic INR on adm././ GI consulted. Conservative treatment for now. HGB falling slowly  11.5>11.1>>11.1>10.4>9.6  today  had blood in stool this AM/, Rec'd Vit K. Initially Coumadin  Held initially- now back on - more bleeding ? Colonoscopy ?  2. H/O DVT/PE: per primary /  back on Coumadin  per primary  3. ESRD -TTS HD. HD in am  holiday sch =m.w.sat- getting today  4. Hypertension/volume - BP well controlled  . On  Losartan 25 mg PO q hs , amlodipine 5 mg PO daily/  4kg > edw ,states " usually tolerates 4- 5 kg uf as op""  No evidence of volume overload. Now eating better  Attempt uf on hd - actually may not need norvasc - will stop ?   5. Anemia - yest 10.4 , today 9.6 and dropping, no ESA needs /Not on as OP- will add  6. Metabolic bone disease -   Corec ca fine/low / phos 5.6    On sensipar 60 mg as op ,  dc 'd with low ca,  Use 2.5 ca bath in am, fu ca/phos ,op binder=Fosrenol ,Continue Hec on hd . 7. Nutrition - . Albumin 3.7>3.3.>3.1  Eating better / renal vit / may need supplement fu am alb  8. DM: Per primary. 9. Disposition: Stable . Per primary. More bleeding will likely delay    [name] A  07/22/2017, 9:37 AM    Labs: Basic Metabolic Panel: Recent Labs  Lab  07/16/17 0333 07/20/17 0743 07/22/17 0745  NA 132* 131* 135  K 4.6 6.3* 5.0  CL 95* 95* 101  CO2 23 21* 22  GLUCOSE 90 151* 176*  BUN 50* 55* 55*  CREATININE 11.26* 10.37* 10.24*  CALCIUM 7.2* 7.1* 7.6*  PHOS  --  6.3* 5.6*   Liver Function Tests: Recent Labs  Lab 07/15/17 1127 07/16/17 0333 07/20/17 0743 07/22/17 0745  AST 41 30  --   --   ALT 42 34  --   --   ALKPHOS 99 85  --   --   BILITOT 0.9 1.0  --   --   PROT 8.0 7.1  --   --   ALBUMIN 3.7 3.3* 3.1* 3.2*   No results for input(s): LIPASE, AMYLASE in the last 168 hours. No results for input(s): AMMONIA in the last 168 hours. CBC: Recent Labs  Lab 07/18/17 0720 07/19/17 0212 07/20/17 0356 07/21/17 0355 07/22/17 0508  WBC 9.3 10.7* 10.9* 9.1 8.7  HGB 11.1* 11.1* 10.4* 10.5* 9.6*  HCT 33.2* 32.7* 31.1* 30.9* 29.0*  MCV 96.2 96.5 97.8 97.2 97.6  PLT 346  348 345 351 347   Cardiac Enzymes: No results for input(s): CKTOTAL, CKMB, CKMBINDEX, TROPONINI in the last 168 hours. CBG: Recent Labs  Lab 07/20/17 2115 07/21/17 0809 07/21/17 1133 07/21/17 1703 07/21/17 2126  GLUCAP 269* 147* 298* 235* 393*    Studies/Results: No results found. Medications:  . amLODipine  5 mg Oral Daily  . insulin aspart  0-5 Units Subcutaneous QHS  . insulin aspart  0-9 Units Subcutaneous TID WC  . lanthanum  1,000 mg Oral TID WC  . losartan  25 mg Oral QHS  . multivitamin  1 tablet Oral QHS  . pantoprazole  40 mg Oral Daily  . polyethylene glycol  17 g Oral Daily  . Warfarin - Pharmacist Dosing Inpatient   Does not apply [phone]

## 2017-07-22 NOTE — Progress Notes (Signed)
Subjective: Was seen and examined at bedside. Reports an episode of bright red blood per rectum with stool and on toilet paper, significant amount yesterday. Was seen as a consult by Dr. Alessandra Bevels on 07/16/2017. Colonoscopy from March 2018 showed fair prep and 2 small tubular adenoma. Patient reports intermittent dark stool, which is now resolved, but needs to be on Coumadin for PE and DVT. Hemoglobin dropped from 11.1-10.4/10.5 and is 9.6 today.   Objective: Vital signs in last 24 hours: Temp:  [97.3 F (36.3 C)-98.9 F (37.2 C)] 97.3 F (36.3 C) (11/19 1156) Pulse Rate:  [83-114] 94 (11/19 1156) Resp:  [16-18] 16 (11/19 1156) BP: (91-142)/(51-92) 114/68 (11/19 1156) SpO2:  [96 %-99 %] 98 % (11/19 1156) Weight:  [94.7 kg (208 lb 12.4 oz)-98.2 kg (216 lb 7.9 oz)] 94.7 kg (208 lb 12.4 oz) (11/19 1156) Weight change:  Last BM Date: 07/20/17  PE: Not in acute distress GENERAL: Mild pallor, no icterus ABDOMEN: Soft, nondistended, nontender, normoactive bowel sounds EXTREMITIES: No edema  Lab Results: Results for orders placed or performed during the hospital encounter of 07/15/17 (from the past 48 hour(s))  Glucose, capillary     Status: Abnormal   Collection Time: 07/20/17  4:37 PM  Result Value Ref Range   Glucose-Capillary 191 (H) 65 - 99 mg/dL  Glucose, capillary     Status: Abnormal   Collection Time: 07/20/17  9:15 PM  Result Value Ref Range   Glucose-Capillary 269 (H) 65 - 99 mg/dL  CBC     Status: Abnormal   Collection Time: 07/21/17  3:55 AM  Result Value Ref Range   WBC 9.1 4.0 - 10.5 K/uL   RBC 3.18 (L) 4.22 - 5.81 MIL/uL   Hemoglobin 10.5 (L) 13.0 - 17.0 g/dL   HCT 30.9 (L) 39.0 - 52.0 %   MCV 97.2 78.0 - 100.0 fL   MCH 33.0 26.0 - 34.0 pg   MCHC 34.0 30.0 - 36.0 g/dL   RDW 14.8 11.5 - 15.5 %   Platelets 351 150 - 400 K/uL  Protime-INR     Status: Abnormal   Collection Time: 07/21/17  3:55 AM  Result Value Ref Range   Prothrombin Time 22.1 (H) 11.4 - 15.2  seconds   INR 1.95   Heparin level (unfractionated)     Status: None   Collection Time: 07/21/17  3:55 AM  Result Value Ref Range   Heparin Unfractionated 0.51 0.30 - 0.70 IU/mL    Comment:        IF HEPARIN RESULTS ARE BELOW EXPECTED VALUES, AND PATIENT DOSAGE HAS BEEN CONFIRMED, SUGGEST FOLLOW UP TESTING OF ANTITHROMBIN III LEVELS.   Glucose, capillary     Status: Abnormal   Collection Time: 07/21/17  8:09 AM  Result Value Ref Range   Glucose-Capillary 147 (H) 65 - 99 mg/dL  Glucose, capillary     Status: Abnormal   Collection Time: 07/21/17 11:33 AM  Result Value Ref Range   Glucose-Capillary 298 (H) 65 - 99 mg/dL  Glucose, capillary     Status: Abnormal   Collection Time: 07/21/17  5:03 PM  Result Value Ref Range   Glucose-Capillary 235 (H) 65 - 99 mg/dL  Glucose, capillary     Status: Abnormal   Collection Time: 07/21/17  9:26 PM  Result Value Ref Range   Glucose-Capillary 393 (H) 65 - 99 mg/dL  CBC     Status: Abnormal   Collection Time: 07/22/17  5:08 AM  Result Value Ref Range  WBC 8.7 4.0 - 10.5 K/uL   RBC 2.97 (L) 4.22 - 5.81 MIL/uL   Hemoglobin 9.6 (L) 13.0 - 17.0 g/dL   HCT 29.0 (L) 39.0 - 52.0 %   MCV 97.6 78.0 - 100.0 fL   MCH 32.3 26.0 - 34.0 pg   MCHC 33.1 30.0 - 36.0 g/dL   RDW 14.7 11.5 - 15.5 %   Platelets 347 150 - 400 K/uL  Protime-INR     Status: Abnormal   Collection Time: 07/22/17  5:08 AM  Result Value Ref Range   Prothrombin Time 25.4 (H) 11.4 - 15.2 seconds   INR 2.34   Heparin level (unfractionated)     Status: Abnormal   Collection Time: 07/22/17  5:08 AM  Result Value Ref Range   Heparin Unfractionated 0.26 (L) 0.30 - 0.70 IU/mL    Comment:        IF HEPARIN RESULTS ARE BELOW EXPECTED VALUES, AND PATIENT DOSAGE HAS BEEN CONFIRMED, SUGGEST FOLLOW UP TESTING OF ANTITHROMBIN III LEVELS.   Renal function panel     Status: Abnormal   Collection Time: 07/22/17  7:45 AM  Result Value Ref Range   Sodium 135 135 - 145 mmol/L    Potassium 5.0 3.5 - 5.1 mmol/L   Chloride 101 101 - 111 mmol/L   CO2 22 22 - 32 mmol/L   Glucose, Bld 176 (H) 65 - 99 mg/dL   BUN 55 (H) 6 - 20 mg/dL   Creatinine, Ser 10.24 (H) 0.61 - 1.24 mg/dL   Calcium 7.6 (L) 8.9 - 10.3 mg/dL   Phosphorus 5.6 (H) 2.5 - 4.6 mg/dL   Albumin 3.2 (L) 3.5 - 5.0 g/dL   GFR calc non Af Amer 5 (L) >60 mL/min   GFR calc Af Amer 6 (L) >60 mL/min    Comment: (NOTE) The eGFR has been calculated using the CKD EPI equation. This calculation has not been validated in all clinical situations. eGFR's persistently <60 mL/min signify possible Chronic Kidney Disease.    Anion gap 12 5 - 15  Glucose, capillary     Status: Abnormal   Collection Time: 07/22/17 12:41 PM  Result Value Ref Range   Glucose-Capillary 208 (H) 65 - 99 mg/dL    Studies/Results: No results found.  Medications: I have reviewed the patient's current medications.  Assessment: 1. Hematochezia, intermittent dark stools, drop in hemoglobin, needs to be on anticoagulation for DVT PE, FOBT positive on 07/15/17 2. End-stage renal disease on hemodialysis  Plan: 1. On Protonix 40 mg by mouth daily. 2. Plan was to start Coumadin 5 mg from today at 6 PM after being on IV heparin.  Recommend hold Coumadin, okay to bridge with heparin, which needs to be on hold 6 hours before a planned colonoscopy on Wednesday 07/24/17.  Possibility of hemorrhoidal bleed, AVM, less likely malignancy. If colonoscopy is unremarkable, may need an EGD as patient needs to be on anticoagulation at least for the next several months for recent DVT and PE. Will start patient on clear liquid diet, start colonic prep tomorrow afternoon, plan a colonoscopy on Wednesday, follow-up PT/INR in a.m., recommend INR to be 1.6 or less for diagnostic and possible therapeutic colonoscopy on Wednesday. Hemodynamically stable.   Ronnette Juniper 07/22/2017, 1:32 PM   Pager [phone] If no answer or after 5 PM call [phone]

## 2017-07-23 LAB — CBC WITH DIFFERENTIAL/PLATELET
Basophils Absolute: 0.1 10*3/uL (ref 0.0–0.1)
Basophils Relative: 0 %
EOS ABS: 0.3 10*3/uL (ref 0.0–0.7)
Eosinophils Relative: 3 %
HEMATOCRIT: 31.7 % — AB (ref 39.0–52.0)
HEMOGLOBIN: 10.8 g/dL — AB (ref 13.0–17.0)
LYMPHS ABS: 2.2 10*3/uL (ref 0.7–4.0)
Lymphocytes Relative: 20 %
MCH: 33.1 pg (ref 26.0–34.0)
MCHC: 34.1 g/dL (ref 30.0–36.0)
MCV: 97.2 fL (ref 78.0–100.0)
Monocytes Absolute: 1.2 10*3/uL — ABNORMAL HIGH (ref 0.1–1.0)
Monocytes Relative: 10 %
NEUTROS ABS: 7.5 10*3/uL (ref 1.7–7.7)
NEUTROS PCT: 67 %
Platelets: 361 10*3/uL (ref 150–400)
RBC: 3.26 MIL/uL — AB (ref 4.22–5.81)
RDW: 14.9 % (ref 11.5–15.5)
WBC: 11.2 10*3/uL — AB (ref 4.0–10.5)

## 2017-07-23 LAB — BASIC METABOLIC PANEL
Anion gap: 13 (ref 5–15)
BUN: 35 mg/dL — ABNORMAL HIGH (ref 6–20)
CHLORIDE: 94 mmol/L — AB (ref 101–111)
CO2: 24 mmol/L (ref 22–32)
Calcium: 7.5 mg/dL — ABNORMAL LOW (ref 8.9–10.3)
Creatinine, Ser: 7.72 mg/dL — ABNORMAL HIGH (ref 0.61–1.24)
GFR calc Af Amer: 8 mL/min — ABNORMAL LOW (ref >60)
GFR calc non Af Amer: 7 mL/min — ABNORMAL LOW (ref >60)
Glucose, Bld: 133 mg/dL — ABNORMAL HIGH (ref 65–99)
POTASSIUM: 4.8 mmol/L (ref 3.5–5.1)
SODIUM: 131 mmol/L — AB (ref 135–145)

## 2017-07-23 LAB — PROTIME-INR
INR: 2.36
INR: 1.67
PROTHROMBIN TIME: 19.5 s — AB (ref 11.4–15.2)
Prothrombin Time: 25.6 seconds — ABNORMAL HIGH (ref 11.4–15.2)

## 2017-07-23 LAB — GLUCOSE, CAPILLARY
GLUCOSE-CAPILLARY: 137 mg/dL — AB (ref 65–99)
GLUCOSE-CAPILLARY: 162 mg/dL — AB (ref 65–99)
GLUCOSE-CAPILLARY: 193 mg/dL — AB (ref 65–99)
GLUCOSE-CAPILLARY: 187 mg/dL — AB (ref 65–99)

## 2017-07-23 MED ORDER — VITAMIN K1 10 MG/ML IJ SOLN
5.0000 mg | Freq: Once | INTRAVENOUS | Status: AC
  Administered 2017-07-23: 5 mg via INTRAVENOUS
  Filled 2017-07-23: qty 0.5

## 2017-07-23 MED ORDER — DEXTROSE 5 % IV SOLN: 1.0000 mg | Freq: Once | INTRAVENOUS | Status: DC

## 2017-07-23 MED ORDER — HEPARIN (PORCINE) IN NACL 100-0.45 UNIT/ML-% IJ SOLN
1200.0000 [IU]/h | INTRAMUSCULAR | Status: AC
  Administered 2017-07-23: 1200 [IU]/h via INTRAVENOUS
  Filled 2017-07-23 (×2): qty 250

## 2017-07-23 NOTE — Care Management Important Message (Signed)
Important Message  Patient Details  Name: [name] MRN: [id number] Date of Birth: Jan 13, 1961   Medicare Important Message Given:  Yes    Luberta Grabinski Abena 07/23/2017, 9:09 AM

## 2017-07-23 NOTE — Progress Notes (Signed)
ANTICOAGULATION CONSULT NOTE -Follow up  Pharmacy Consult for heparin  Indication:  h/o DVT/PE (04/02/17)    Allergies  Allergen Reactions  . No Known Allergies    Patient Measurements: Height: 5\' 11"  (180.3 cm) Weight: 207 lb 3.7 oz (94 kg) IBW/kg (Calculated) : 75.3 Heparin Dosing Weight: 94kg  Vital Signs: Temp: 98.5 F (36.9 C) (11/20 0812) Temp Source: Oral (11/20 0812) BP: 108/73 (11/20 0812) Pulse Rate: 87 (11/20 0812)  Labs: Recent Labs    07/21/17 0355 07/22/17 0508 07/22/17 0745 07/23/17 0542 07/23/17 1539  HGB 10.5* 9.6*  --  10.8*  --   HCT 30.9* 29.0*  --  31.7*  --   PLT 351 347  --  361  --   LABPROT 22.1* 25.4*  --  25.6* 19.5*  INR 1.95 2.34  --  2.36 1.67  HEPARINUNFRC 0.51 0.26*  --   --   --   CREATININE  --   --  10.24* 7.72*  --     Estimated Creatinine Clearance: 12.5 mL/min (A) (by C-G formula based on SCr of 7.72 mg/dL (H)).  Assessment: 56 yo M admitted with GIB. On warfarin PTA (home dose 5mg  daily) - held on admission. Admit INR elevated at 4.13 - patient received vitamin K 1mg  IV x1 on 11/12 PM. Heparin bridge and warfarin resumed on 11/14 for h/o RLE DVT/PE.   INR was therapeutic this am.  Noted BRB in stools per RN notes.  Pt has now been seen by GI who recommends holding warfarin and vit K 5mg  IV x1 given-  INR now 1.6.  Plan to bridge with heparin while INR <2.  Stop heparin 6 hours prior to colonoscopy 11/21.  The patient was re-educated on warfarin this admission.  Goal of Therapy:  HL 0.3-0.5 - lower end normal with bleeding INR 2-3 Monitor platelets by anticoagulation protocol: Yes   Plan:  Start heparin drip 1200 uts/hr HL 6hr after start CBC, INR, HL daily Monitor s/s bleeding  Bonnita Nasuti Pharm.D. CPP, BCPS Clinical Pharmacist [phone] 07/23/2017 4:43 PM

## 2017-07-23 NOTE — Progress Notes (Signed)
PROGRESS NOTE    [name]  YOV:[id number] DOB: [birth year] DOA: 07/15/2017 PCP: Arnoldo Morale, MD  Brief Narrative:N[age]ative79 year old male with PMH of ESRD on TTS HD, HTN, DM 2, chronic diastolic CHF, GERD, PAD, PE/right femoral DVT 04/02/17 on Coumadin managed at [facility], presented to ED on 07/15/17 with rectal bleeding, INR 4.1 and hemoglobin 11.7. Supratherapeutic INR was reversed with vitamin K. Rectal bleeding has resolved. GI was consulted and recommend conservative management. Nephrology consulted for dialysis needs. Resume Coumadin with IV heparin bridging. DC home when INR therapeuticABOVE 2.       Assessment & Plan:   Active Problems:   Acute GI bleeding   Hyperkalemia  Acute lower GI bleeding - In the setting of elevated INR 4.13 but source not clearly known. Supratherapeutic INR was reversed with a dose of vitamin K. Rectal bleeding has resolved. -Patient had an episode of bright red bleeding per rectum with stool and on the toilet paper with significant amount last night.  He did have another bowel movement today which was not bloody.  GI was reconsulted.  Appreciate DrKarkis  input.  Patient to have colonoscopy most likely Wednesday once the INR is less than 1.6 pharmacy to.  Bridge heparin and DC heparin 6 hours prior to colonoscopy.  For colonoscopy Wednesday..  Coumadin to be held continue Protonix DVT/PE 04/02/17-ON HEPARIN.  COUMADIN ON HOLD.inr 2.38 today gave a dose of vitamin k 1mg  iv.follow up inr later today.  ESRD TTH SAT HD.  HTN-CONTINUE LOSARTAN  DM Restart glipizide    DVT prophylaxis heparin Code Status:full Family Communication: none Disposition Plan:  tbd Consultants: gi  Procedures:colonoscopy tomorrow  Antimicrobials: none  Subjective:no further bleeding/blood stained stool  Objective: Vitals:   07/22/17 1653 07/22/17 2046 07/23/17 0512 07/23/17 0812  BP: 107/66 113/71 94/60 108/73    Pulse: 97 98 85 87  Resp: 17 16 17 17   Temp: 99.3 F (37.4 C) 98.6 F (37 C) 98.5 F (36.9 C) 98.5 F (36.9 C)  TempSrc: Oral   Oral  SpO2: 95% 100% 97% 97%  Weight:  94 kg (207 lb 3.7 oz)    Height:        Intake/Output Summary (Last 24 hours) at 07/23/2017 1251 Last data filed at 07/23/2017 0900 Gross per 24 hour  Intake 700 ml  Output 75 ml  Net 625 ml   Filed Weights   07/22/17 0743 07/22/17 1156 07/22/17 2046  Weight: 98.2 kg (216 lb 7.9 oz) 94.7 kg (208 lb 12.4 oz) 94 kg (207 lb 3.7 oz)    Examination:  General exam: Appears calm and comfortable  Respiratory system: Clear to auscultation. Respiratory effort normal. Cardiovascular system: S1 & S2 heard, RRR. No JVD, murmurs, rubs, gallops or clicks. No pedal edema. Gastrointestinal system: Abdomen is nondistended, soft and nontender. No organomegaly or masses felt. Normal bowel sounds heard. Central nervous system: Alert and oriented. No focal neurological deficits. Extremities: Symmetric 5 x 5 power. Skin: No rashes, lesions or ulcers Psychiatry: Judgement and insight appear normal. Mood & affect appropriate.     Data Reviewed: I have personally reviewed following labs and imaging studies  CBC: Recent Labs  Lab 07/19/17 0212 07/20/17 0356 07/21/17 0355 07/22/17 0508 07/23/17 0542  WBC 10.7* 10.9* 9.1 8.7 11.2*  NEUTROABS  --   --   --   --  7.5  HGB 11.1* 10.4* 10.5* 9.6* 10.8*  HCT 32.7* 31.1* 30.9* 29.0* 31.7*  MCV 96.5 97.8 97.2 97.6  97.2  PLT 348 345 351 347 497   Basic Metabolic Panel: Recent Labs  Lab 07/20/17 0743 07/22/17 0745 07/23/17 0542  NA 131* 135 131*  K 6.3* 5.0 4.8  CL 95* 101 94*  CO2 21* 22 24  GLUCOSE 151* 176* 133*  BUN 55* 55* 35*  CREATININE 10.37* 10.24* 7.72*  CALCIUM 7.1* 7.6* 7.5*  PHOS 6.3* 5.6*  --    GFR: Estimated Creatinine Clearance: 12.5 mL/min (A) (by C-G formula based on SCr of 7.72 mg/dL (H)). Liver Function Tests: Recent Labs  Lab 07/20/17 0743  07/22/17 0745  ALBUMIN 3.1* 3.2*   No results for input(s): LIPASE, AMYLASE in the last 168 hours. No results for input(s): AMMONIA in the last 168 hours. Coagulation Profile: Recent Labs  Lab 07/19/17 0212 07/20/17 0356 07/21/17 0355 07/22/17 0508 07/23/17 0542  INR 1.46 1.65 1.95 2.34 2.36   Cardiac Enzymes: No results for input(s): CKTOTAL, CKMB, CKMBINDEX, TROPONINI in the last 168 hours. BNP (last 3 results) No results for input(s): PROBNP in the last 8760 hours. HbA1C: No results for input(s): HGBA1C in the last 72 hours. CBG: Recent Labs  Lab 07/22/17 1241 07/22/17 1656 07/22/17 2157 07/23/17 0738 07/23/17 1147  GLUCAP 208* 286* 196* 137* 162*   Lipid Profile: No results for input(s): CHOL, HDL, LDLCALC, TRIG, CHOLHDL, LDLDIRECT in the last 72 hours. Thyroid Function Tests: No results for input(s): TSH, T4TOTAL, FREET4, T3FREE, THYROIDAB in the last 72 hours. Anemia Panel: No results for input(s): VITAMINB12, FOLATE, FERRITIN, TIBC, IRON, RETICCTPCT in the last 72 hours. Sepsis Labs: No results for input(s): PROCALCITON, LATICACIDVEN in the last 168 hours.  Recent Results (from the past 240 hour(s))  MRSA PCR Screening     Status: None   Collection Time: 07/16/17  3:54 AM  Result Value Ref Range Status   MRSA by PCR NEGATIVE NEGATIVE Final    Comment:        The GeneXpert MRSA Assay (FDA approved for NASAL specimens only), is one component of a comprehensive MRSA colonization surveillance program. It is not intended to diagnose MRSA infection nor to guide or monitor treatment for MRSA infections.          Radiology Studies: No results found.      Scheduled Meds: . darbepoetin (ARANESP) injection - DIALYSIS  60 mcg Intravenous Q Mon-HD  . doxercalciferol  1 mcg Intravenous Q M,W,F-HD  . insulin aspart  0-5 Units Subcutaneous QHS  . insulin aspart  0-9 Units Subcutaneous TID WC  . lanthanum  1,000 mg Oral TID WC  . multivitamin  1  tablet Oral QHS  . pantoprazole  40 mg Oral Daily  . polyethylene glycol  17 g Oral Daily  . polyethylene glycol-electrolytes  4,000 mL Oral Once   Continuous Infusions:   LOS: 8 days      Georgette Shell, MD [facility] If 7PM-7AM, please contact night-coverage [email or web address] Password [email or web address] 07/23/2017, 12:51 PM

## 2017-07-23 NOTE — Progress Notes (Signed)
[facility] Progress Note   Subjective:  No bloody stools this AM. Sitting up at bedside. No C/Os. HD yesterday removed 4000 tolerated well   Objective Vitals:   07/22/17 1653 07/22/17 2046 07/23/17 0512 07/23/17 0812  BP: 107/66 113/71 94/60 108/73  Pulse: 97 98 85 87  Resp: 17 16 17 17   Temp: 99.3 F (37.4 C) 98.6 F (37 C) 98.5 F (36.9 C) 98.5 F (36.9 C)  TempSrc: Oral   Oral  SpO2: 95% 100% 97% 97%  Weight:  94 kg (207 lb 3.7 oz)    Height:       Physical Exam General: Pleasant, NAD  Heart: S1,S2, RRR Lungs: CTAB A/P Abdomen: Active BS, SNT Extremities: No LE edema Dialysis Access: RUA AVF + bruit   Additional Objective Labs: Basic Metabolic Panel: Recent Labs  Lab 07/20/17 0743 07/22/17 0745 07/23/17 0542  NA 131* 135 131*  K 6.3* 5.0 4.8  CL 95* 101 94*  CO2 21* 22 24  GLUCOSE 151* 176* 133*  BUN 55* 55* 35*  CREATININE 10.37* 10.24* 7.72*  CALCIUM 7.1* 7.6* 7.5*  PHOS 6.3* 5.6*  --    Liver Function Tests: Recent Labs  Lab 07/20/17 0743 07/22/17 0745  ALBUMIN 3.1* 3.2*   No results for input(s): LIPASE, AMYLASE in the last 168 hours. CBC: Recent Labs  Lab 07/19/17 0212 07/20/17 0356 07/21/17 0355 07/22/17 0508 07/23/17 0542  WBC 10.7* 10.9* 9.1 8.7 11.2*  NEUTROABS  --   --   --   --  7.5  HGB 11.1* 10.4* 10.5* 9.6* 10.8*  HCT 32.7* 31.1* 30.9* 29.0* 31.7*  MCV 96.5 97.8 97.2 97.6 97.2  PLT 348 345 351 347 361   Blood Culture    Component Value Date/Time   SDES BLOOD LEFT ANTECUBITAL 09/29/2016 0950   SPECREQUEST BOTTLES DRAWN AEROBIC AND ANAEROBIC  5CC 09/29/2016 0950   CULT NO GROWTH 5 DAYS 09/29/2016 0950   REPTSTATUS 10/04/2016 FINAL 09/29/2016 0950    Cardiac Enzymes: No results for input(s): CKTOTAL, CKMB, CKMBINDEX, TROPONINI in the last 168 hours. CBG: Recent Labs  Lab 07/21/17 2126 07/22/17 1241 07/22/17 1656 07/22/17 2157 07/23/17 0738  GLUCAP 393* 208* 286* 196* 137*   Iron Studies: No  results for input(s): IRON, TIBC, TRANSFERRIN, FERRITIN in the last 72 hours. @lablastinr3 @ Studies/Results: No results found. Medications:  . darbepoetin (ARANESP) injection - DIALYSIS  60 mcg Intravenous Q Mon-HD  . doxercalciferol  1 mcg Intravenous Q M,W,F-HD  . insulin aspart  0-5 Units Subcutaneous QHS  . insulin aspart  0-9 Units Subcutaneous TID WC  . lanthanum  1,000 mg Oral TID WC  . multivitamin  1 tablet Oral QHS  . pantoprazole  40 mg Oral Daily  . polyethylene glycol  17 g Oral Daily  . polyethylene glycol-electrolytes  4,000 mL Oral Once    Dialysis Orders: T,Th,S [place] 4 hrs 180 NRe 400/800 92 kg 2.0 K/ 2.5 Ca  -Heparin 6000 units IV TIW -Hectorol 1 mcg IV TIW -No ESA/Venofer  BMD meds:  -Auryxia 210 mg 2 tabs TID A/C -Sensipar 60 mg PO q evening  Assessment/Plan: 1. Acute Lower GIB: unclear origin. Supra-therapeutic INR on adm and Rec'd Vit K. Was stable on conservative management but unfortunately started to have bright red blood in stools again. GI has been re-consulted. Going for colonoscopy 07/24/17. Will need HD 1st shift. HGB 10.8. No blood transfusions.  2. H/O DVT/PE: Coumadin on hold per primary  3.  ESRD -  T,Th,S . HD on Wednesday per holiday schedule. No heparin. 1st shift.  4.  Hypertension/volume  - BP well controlled. Continue Losartan 25 mg PO q day, amlodipine 5 mg PO daily. HD yesterday pre wt 98.2 kg Net UF 4.0 liters Post wt 94.7 kg. Usually never gets to EDW. Could be raised to 93 kg.  5.  Anemia  - HGB 10.8. No ESA ordered as OP, none needed now. Follow HGB.  6.  Metabolic bone disease -  Continue binders/VDRA when able to eat.  7.  Nutrition - clear liquid diet at present. Albumin 3.2 8.  DM: Per primary.     Rita H. Brown NP-C 07/23/2017, 8:37 AM  [facility] [phone]  Patient seen and examined, agree with above note with above modifications.  No new complaints- no more bleeding- plan for colonoscopy tomorrow as  well as HD on holiday schedule  [name], MD 07/23/2017  ]

## 2017-07-24 ENCOUNTER — Inpatient Hospital Stay (HOSPITAL_COMMUNITY): Payer: Medicare Other | Admitting: Anesthesiology

## 2017-07-24 ENCOUNTER — Encounter (HOSPITAL_COMMUNITY)
Admission: EM | Disposition: A | Discharge status: Home / Self Care | Payer: Self-pay | Source: Home / Self Care | Attending: Family Medicine

## 2017-07-24 ENCOUNTER — Other Ambulatory Visit: Payer: Self-pay | Admitting: *Deleted

## 2017-07-24 ENCOUNTER — Encounter (HOSPITAL_COMMUNITY): Payer: Self-pay | Admitting: *Deleted

## 2017-07-24 DIAGNOSIS — I5032 Chronic diastolic (congestive) heart failure: Secondary | ICD-10-CM

## 2017-07-24 HISTORY — PX: COLONOSCOPY WITH PROPOFOL: SHX5780

## 2017-07-24 LAB — RENAL FUNCTION PANEL
Albumin: 3.4 g/dL — ABNORMAL LOW (ref 3.5–5.0)
Anion gap: 14 (ref 5–15)
BUN: 47 mg/dL — ABNORMAL HIGH (ref 6–20)
CO2: 22 mmol/L (ref 22–32)
Calcium: 7.8 mg/dL — ABNORMAL LOW (ref 8.9–10.3)
Chloride: 93 mmol/L — ABNORMAL LOW (ref 101–111)
Creatinine, Ser: 9.35 mg/dL — ABNORMAL HIGH (ref 0.61–1.24)
GFR calc Af Amer: 6 mL/min — ABNORMAL LOW (ref >60)
GFR calc non Af Amer: 6 mL/min — ABNORMAL LOW (ref >60)
Glucose, Bld: 141 mg/dL — ABNORMAL HIGH (ref 65–99)
Phosphorus: 6.3 mg/dL — ABNORMAL HIGH (ref 2.5–4.6)
Potassium: 4.8 mmol/L (ref 3.5–5.1)
Sodium: 129 mmol/L — ABNORMAL LOW (ref 135–145)

## 2017-07-24 LAB — CBC
HCT: 30.4 % — ABNORMAL LOW (ref 39.0–52.0)
HCT: 30.7 % — ABNORMAL LOW (ref 39.0–52.0)
Hemoglobin: 10.7 g/dL — ABNORMAL LOW (ref 13.0–17.0)
Hemoglobin: 10.9 g/dL — ABNORMAL LOW (ref 13.0–17.0)
MCH: 33.5 pg (ref 26.0–34.0)
MCH: 34 pg (ref 26.0–34.0)
MCHC: 35.2 g/dL (ref 30.0–36.0)
MCHC: 35.5 g/dL (ref 30.0–36.0)
MCV: 95.3 fL (ref 78.0–100.0)
MCV: 95.6 fL (ref 78.0–100.0)
PLATELETS: 325 10*3/uL (ref 150–400)
Platelets: 388 K/uL (ref 150–400)
RBC: 3.19 MIL/uL — ABNORMAL LOW (ref 4.22–5.81)
RBC: 3.21 MIL/uL — ABNORMAL LOW (ref 4.22–5.81)
RDW: 14.4 % (ref 11.5–15.5)
RDW: 14.6 % (ref 11.5–15.5)
WBC: 11.6 10*3/uL — ABNORMAL HIGH (ref 4.0–10.5)
WBC: 12.4 10*3/uL — ABNORMAL HIGH (ref 4.0–10.5)

## 2017-07-24 LAB — HEPARIN LEVEL (UNFRACTIONATED): HEPARIN UNFRACTIONATED: 0.3 [IU]/mL (ref 0.30–0.70)

## 2017-07-24 LAB — POCT I-STAT 4, (NA,K, GLUC, HGB,HCT)
Glucose, Bld: 155 mg/dL — ABNORMAL HIGH (ref 65–99)
HCT: 33 % — ABNORMAL LOW (ref 39.0–52.0)
HEMOGLOBIN: 11.2 g/dL — AB (ref 13.0–17.0)
Potassium: 4.3 mmol/L (ref 3.5–5.1)
SODIUM: 132 mmol/L — AB (ref 135–145)

## 2017-07-24 LAB — GLUCOSE, CAPILLARY
GLUCOSE-CAPILLARY: 156 mg/dL — AB (ref 65–99)
GLUCOSE-CAPILLARY: 270 mg/dL — AB (ref 65–99)
Glucose-Capillary: 188 mg/dL — ABNORMAL HIGH (ref 65–99)

## 2017-07-24 LAB — PROTIME-INR
INR: 1.21
Prothrombin Time: 15.2 seconds (ref 11.4–15.2)

## 2017-07-24 SURGERY — COLONOSCOPY WITH PROPOFOL
Anesthesia: Monitor Anesthesia Care | Laterality: Left

## 2017-07-24 MED ORDER — PROPOFOL 500 MG/50ML IV EMUL
INTRAVENOUS | Status: DC | PRN
  Administered 2017-07-24: 100 ug/kg/min via INTRAVENOUS

## 2017-07-24 MED ORDER — PHENYLEPHRINE 40 MCG/ML (10ML) SYRINGE FOR IV PUSH (FOR BLOOD PRESSURE SUPPORT)
PREFILLED_SYRINGE | INTRAVENOUS | Status: DC | PRN
  Administered 2017-07-24: 120 ug via INTRAVENOUS
  Administered 2017-07-24: 80 ug via INTRAVENOUS

## 2017-07-24 MED ORDER — SODIUM CHLORIDE 0.9 % IV SOLN: 100.0000 mL | INTRAVENOUS | Status: DC | PRN

## 2017-07-24 MED ORDER — SODIUM CHLORIDE 0.9 % IV SOLN: INTRAVENOUS | Status: DC

## 2017-07-24 MED ORDER — LIDOCAINE HCL (PF) 1 % IJ SOLN: 5.0000 mL | INTRAMUSCULAR | Status: DC | PRN

## 2017-07-24 MED ORDER — PHENYLEPHRINE HCL 10 MG/ML IJ SOLN
INTRAVENOUS | Status: DC | PRN
  Administered 2017-07-24: 25 ug/min via INTRAVENOUS

## 2017-07-24 MED ORDER — DOXERCALCIFEROL 4 MCG/2ML IV SOLN
INTRAVENOUS | Status: AC
  Filled 2017-07-24: qty 2

## 2017-07-24 MED ORDER — LIDOCAINE-PRILOCAINE 2.5-2.5 % EX CREA: 1.0000 "application " | TOPICAL_CREAM | CUTANEOUS | Status: DC | PRN

## 2017-07-24 MED ORDER — SODIUM CHLORIDE 0.9 % IV SOLN
INTRAVENOUS | Status: DC | PRN
  Administered 2017-07-24: 12:00:00 via INTRAVENOUS

## 2017-07-24 MED ORDER — ONDANSETRON HCL 4 MG/2ML IJ SOLN
INTRAMUSCULAR | Status: DC | PRN
Start: 2017-07-24 — End: 2017-07-24
  Administered 2017-07-24: 4 mg via INTRAVENOUS

## 2017-07-24 MED ORDER — PENTAFLUOROPROP-TETRAFLUOROETH EX AERO: 1.0000 "application " | INHALATION_SPRAY | CUTANEOUS | Status: DC | PRN

## 2017-07-24 NOTE — Progress Notes (Signed)
Pt is off floor in hemodialysis during shift report.

## 2017-07-24 NOTE — Anesthesia Postprocedure Evaluation (Signed)
Anesthesia Post Note  Patient: [name]  Procedure(s) Performed: COLONOSCOPY WITH PROPOFOL (Left )     Patient location during evaluation: Endoscopy Anesthesia Type: MAC Level of consciousness: awake and alert Pain management: pain level controlled Vital Signs Assessment: post-procedure vital signs reviewed and stable Respiratory status: spontaneous breathing, nonlabored ventilation, respiratory function stable and patient connected to nasal cannula oxygen Cardiovascular status: stable and blood pressure returned to baseline Postop Assessment: no apparent nausea or vomiting Anesthetic complications: no    Last Vitals:  Vitals:   07/24/17 1454 07/24/17 1651  BP: 137/81 (!) 115/99  Pulse: 88 (!) 114  Resp: 16 17  Temp: 36.4 C 36.6 C  SpO2: 100% 99%    Last Pain:  Vitals:   07/24/17 1651  TempSrc: Oral  PainSc:                  Catalina Gravel

## 2017-07-24 NOTE — Progress Notes (Signed)
Pt back in room from ENDO aaox4 without complaints, no bedside report given, will continue to monitor.

## 2017-07-24 NOTE — Brief Op Note (Signed)
07/15/2017 - 07/24/2017  2:11 PM  PATIENT:  [name]  56 y.o. male  PRE-OPERATIVE DIAGNOSIS:  hematochezia  POST-OPERATIVE DIAGNOSIS:  polyp / hemorrhoids  PROCEDURE:  Procedure(s): COLONOSCOPY WITH PROPOFOL (Left)  SURGEON:  Surgeon(s) and Role:    Ronnette Juniper, MD - Primary  PHYSICIAN ASSISTANT:   ASSISTANTS: Cleda Daub, RN, Aneta Mins, Tech   ANESTHESIA:   MAC  EBL:  none   BLOOD ADMINISTERED:none  DRAINS: none   LOCAL MEDICATIONS USED:  NONE  SPECIMEN:  Biopsy / Limited Resection  DISPOSITION OF SPECIMEN:  PATHOLOGY  COUNTS:  YES  TOURNIQUET:  * No tourniquets in log *  DICTATION: .Dragon Dictation  PLAN OF CARE: Admit to inpatient   PATIENT DISPOSITION:  PACU - hemodynamically stable.   Delay start of Pharmacological VTE agent (>24hrs) due to surgical blood loss or risk of bleeding: yes

## 2017-07-24 NOTE — Procedures (Signed)
Patient was seen on dialysis and the procedure was supervised.  BFR 400  Via AVF BP is  95/55.   Patient appears to be tolerating treatment well  [name] A 07/24/2017

## 2017-07-24 NOTE — Patient Outreach (Signed)
[facility]) Care Management  [id number]  [name] [birth year] [id number]  Transition of Care Referral  Referral Date: 07/17/17 Referral Source: [facility])  Date of Discharge: Inpatient Facility: [facility] Discharge Diagnosis: Acute GI Bleed Insurance: Medicare  RN CM received referral for patient on 07/17/17. Patient remains in inpatient at [facility] as of 07/24/17.  Plan: RN CM will continue to follow patient during inpatient stay at [place], RN, BSN, MHA/MSL, [place] Direct Phone: [phone] Toll Free: [phone] Fax: [phone]

## 2017-07-24 NOTE — Op Note (Signed)
Colonoscopy was performed for rectal bleeding and need for anticoagulation for recent DVT and PE.  Small internal hemorrhoids were noted on retroflexion. Terminal ileum appeared unremarkable. Large amount of liquid and semiliquid stool was found mostly in the right colon making visualization difficult despite lavage. One erythematous 12 mm semi-pedunculated polyp was noted in the sigmoid. Removed completely via snare polypectomy. Rest of the colon appeared unremarkable.   Recommend: Resuming renal diet. Okay to resume Coumadin in a.m.. Follow up pathology as an outpatient. Repeat colonoscopy in one year for surveillance as patient had a fair prep. OK to be discharged from GI standpoint. Follow-up with Dr. Alessandra Bevels as an outpatient.  Ronnette Juniper, M.D.

## 2017-07-24 NOTE — Anesthesia Procedure Notes (Signed)
Procedure Name: MAC Date/Time: 07/24/2017 1:49 PM Performed by: Kyung Rudd, CRNA Pre-anesthesia Checklist: Patient identified, Emergency Drugs available, Suction available and Patient being monitored Patient Re-evaluated:Patient Re-evaluated prior to induction Oxygen Delivery Method: Simple face mask Preoxygenation: Pre-oxygenation with 100% oxygen Induction Type: IV induction Placement Confirmation: positive ETCO2 Dental Injury: Teeth and Oropharynx as per pre-operative assessment

## 2017-07-24 NOTE — Progress Notes (Signed)
Pt transferred to Endo from hemodialysis.

## 2017-07-24 NOTE — Progress Notes (Signed)
PROGRESS NOTE    [name]  ZJQ:[id number] DOB: 1961/06/28 DOA: 07/15/2017 PCP: Arnoldo Morale, MD  Brief Narrative:Narrative77 year old male with PMH of ESRD on TTS HD, HTN, DM 2, chronic diastolic CHF, GERD, PAD, PE/right femoral DVT 04/02/17 on Coumadin managed at [place], presented to ED on 07/15/17 with rectal bleeding, INR 4.1 and hemoglobin 11.7. Supratherapeutic INR was reversed with vitamin K. Rectal bleeding has resolved. GI was consulted and recommend conservative management. Nephrology consulted for dialysis needs. Resume Coumadin with IV heparin bridging. DC home when INR therapeuticABOVE 2.  Pt seen and examined at his bedside. Post colonoscopy. States he feels fine and is hungry. Denies abdominal pain.  Colonoscopy was performed for rectal bleeding and need for anticoagulation for recent DVT and PE.  Small internal hemorrhoids were noted on retroflexion. Terminal ileum appeared unremarkable. One erythematous 12 mm semi-pedunculated polyp was noted in the sigmoid. Removed completely via snare polypectomy. Rest of the colon appeared unremarkable   Assessment & Plan:   Active Problems:   Acute GI bleeding   Hyperkalemia  Acute lower GI bleeding 2/2 hemorrhoids vs semi-pedunculated polyp -In the setting of elevated INR 4.13 on presentation -INR 1.21 today 07/24/17 -pharmacy managing coumadin -hold coumadin today due to colonoscopy done today to avoid further GI bleed -GI recommends holding for 24 hrs  Subtherapeutic INR -INR 1.21 -hold heparin and coumadin today per GI -had colonoscopy done today -resume heparin and coumadin in the am -Monitor closely overnight. -No SCDs due to DVT   ESRD on HD TTH SAT -keep dialysis appointments -HD tomorrow  Hyponatremia, possibly dilutional -improving -euvolemic -na+ 132 from 129 -BMP am  HTN- -losartan  Type 2 DM  -glipizide -BMP am  Anemia of chronic disease -most  likely 2/2 ESRD -stable -stopped bleeding -hg 11.2 -CBC am    DVT prophylaxis Resume heparin drip in the am Code Status:full Family Communication: none Disposition Plan: will stay another midnight for close monitoring. Will restart heparin drip and coumadin in the am Consultants: GI  Procedures:colonoscopy today 07/24/17  Antimicrobials: none   Objective: Vitals:   07/23/17 0812 07/23/17 1705 07/23/17 2150 07/24/17 0431  BP: 108/73 129/88 121/80 104/65  Pulse: 87 92 94 81  Resp: 17 17 18 18   Temp: 98.5 F (36.9 C) 97.7 F (36.5 C) 98.6 F (37 C) 98.4 F (36.9 C)  TempSrc: Oral Oral Oral Oral  SpO2: 97% 98% 98% 97%  Weight:   94 kg (207 lb 4.1 oz)   Height:        Intake/Output Summary (Last 24 hours) at 07/24/2017 0739 Last data filed at 07/24/2017 0432 Gross per 24 hour  Intake 700 ml  Output 525 ml  Net 175 ml   Filed Weights   07/22/17 1156 07/22/17 2046 07/23/17 2150  Weight: 94.7 kg (208 lb 12.4 oz) 94 kg (207 lb 3.7 oz) 94 kg (207 lb 4.1 oz)    Examination:  General exam: 56 yo Male WD WN NAD A&O x3 Respiratory system: Clear to auscultation. Respiratory effort normal. Cardiovascular system: S1 & S2 heard, RRR. No JVD, murmurs, rubs, gallops or clicks. No pedal edema. Gastrointestinal system: Abdomen is nondistended, soft and nontender. No organomegaly or masses felt. Normal bowel sounds heard. Central nervous system: Alert and oriented. No focal neurological deficits. Extremities: Symmetric 5 x 5 power. Skin: No rashes, lesions or open ulcers Psychiatry: Judgement and insight appear normal. Mood & affect appropriate.     Data Reviewed: I have personally  reviewed following labs and imaging studies  CBC: Recent Labs  Lab 07/20/17 0356 07/21/17 0355 07/22/17 0508 07/23/17 0542 07/24/17 0524  WBC 10.9* 9.1 8.7 11.2* 11.6*  NEUTROABS  --   --   --  7.5  --   HGB 10.4* 10.5* 9.6* 10.8* 10.7*  HCT 31.1* 30.9* 29.0* 31.7* 30.4*  MCV 97.8 97.2  97.6 97.2 95.3  PLT 345 351 347 361 037   Basic Metabolic Panel: Recent Labs  Lab 07/20/17 0743 07/22/17 0745 07/23/17 0542  NA 131* 135 131*  K 6.3* 5.0 4.8  CL 95* 101 94*  CO2 21* 22 24  GLUCOSE 151* 176* 133*  BUN 55* 55* 35*  CREATININE 10.37* 10.24* 7.72*  CALCIUM 7.1* 7.6* 7.5*  PHOS 6.3* 5.6*  --    GFR: Estimated Creatinine Clearance: 12.5 mL/min (A) (by C-G formula based on SCr of 7.72 mg/dL (H)). Liver Function Tests: Recent Labs  Lab 07/20/17 0743 07/22/17 0745  ALBUMIN 3.1* 3.2*   No results for input(s): LIPASE, AMYLASE in the last 168 hours. No results for input(s): AMMONIA in the last 168 hours. Coagulation Profile: Recent Labs  Lab 07/20/17 0356 07/21/17 0355 07/22/17 0508 07/23/17 0542 07/23/17 1539  INR 1.65 1.95 2.34 2.36 1.67   Cardiac Enzymes: No results for input(s): CKTOTAL, CKMB, CKMBINDEX, TROPONINI in the last 168 hours. BNP (last 3 results) No results for input(s): PROBNP in the last 8760 hours. HbA1C: No results for input(s): HGBA1C in the last 72 hours. CBG: Recent Labs  Lab 07/22/17 2157 07/23/17 0738 07/23/17 1147 07/23/17 1652 07/23/17 2145  GLUCAP 196* 137* 162* 193* 187*   Lipid Profile: No results for input(s): CHOL, HDL, LDLCALC, TRIG, CHOLHDL, LDLDIRECT in the last 72 hours. Thyroid Function Tests: No results for input(s): TSH, T4TOTAL, FREET4, T3FREE, THYROIDAB in the last 72 hours. Anemia Panel: No results for input(s): VITAMINB12, FOLATE, FERRITIN, TIBC, IRON, RETICCTPCT in the last 72 hours. Sepsis Labs: No results for input(s): PROCALCITON, LATICACIDVEN in the last 168 hours.  Recent Results (from the past 240 hour(s))  MRSA PCR Screening     Status: None   Collection Time: 07/16/17  3:54 AM  Result Value Ref Range Status   MRSA by PCR NEGATIVE NEGATIVE Final    Comment:        The GeneXpert MRSA Assay (FDA approved for NASAL specimens only), is one component of a comprehensive MRSA  colonization surveillance program. It is not intended to diagnose MRSA infection nor to guide or monitor treatment for MRSA infections.          Radiology Studies: No results found.      Scheduled Meds: . darbepoetin (ARANESP) injection - DIALYSIS  60 mcg Intravenous Q Mon-HD  . doxercalciferol  1 mcg Intravenous Q M,W,F-HD  . insulin aspart  0-5 Units Subcutaneous QHS  . insulin aspart  0-9 Units Subcutaneous TID WC  . lanthanum  1,000 mg Oral TID WC  . multivitamin  1 tablet Oral QHS  . pantoprazole  40 mg Oral Daily  . polyethylene glycol  17 g Oral Daily   Continuous Infusions: . sodium chloride    . sodium chloride       LOS: 9 days      Kayleen Memos, MD [facility] If 7PM-7AM, please contact night-coverage [email or web address] Password [facility] 07/24/2017, 7:39 AM

## 2017-07-24 NOTE — Interval H&P Note (Signed)
History and Physical Interval Note: 56 year old male for a colonoscopy for rectal bleeding. Coumadin is on hold, INR range is appropriate for therapeutic procedure if required.  07/24/2017 81:27 PM  [name]  has presented today for colonoscopy, with the diagnosis of hematochezia  The various methods of treatment have been discussed with the patient and family. After consideration of risks, benefits and other options for treatment, the patient has consented to  Procedure(s): COLONOSCOPY WITH PROPOFOL (Left) as a surgical intervention .  The patient's history has been reviewed, patient examined, no change in status, stable for surgery.  I have reviewed the patient's chart and labs.  Questions were answered to the patient's satisfaction.     Ronnette Juniper

## 2017-07-24 NOTE — Transfer of Care (Signed)
Immediate Anesthesia Transfer of Care Note  Patient: [name]  Procedure(s) Performed: COLONOSCOPY WITH PROPOFOL (Left )  Patient Location: Endoscopy Unit  Anesthesia Type:MAC  Level of Consciousness: awake, alert  and oriented  Airway & Oxygen Therapy: Patient Spontanous Breathing and Patient connected to nasal cannula oxygen  Post-op Assessment: Report given to RN, Post -op Vital signs reviewed and stable and Patient moving all extremities  Post vital signs: Reviewed and stable  Last Vitals:  Vitals:   07/24/17 1232 07/24/17 1421  BP: 102/69 102/69  Pulse:    Resp: 16 16  Temp: 36.9 C 36.6 C  SpO2: 97% 100%    Last Pain:  Vitals:   07/24/17 1421  TempSrc: Oral  PainSc:          Complications: No apparent anesthesia complications

## 2017-07-24 NOTE — Op Note (Signed)
[facility] Patient Name: [name] Procedure Date : 07/24/2017 MRN: [id number] Attending MD: Ronnette Juniper , MD Date of Birth: [birth year] CSN: [id number] Age: 56 Admit Type: Inpatient Procedure:                Colonoscopy Indications:              Rectal bleeding, last colonoscopy 3/18,needs                            anticoagulation for recent DVT/PE Providers:                Ronnette Juniper, MD, Cleda Daub, RN, Alan Mulder,                            Technician Referring MD:              Medicines:                Monitored Anesthesia Care Complications:            No immediate complications. Estimated Blood Loss:     Estimated blood loss: none. Procedure:                Pre-Anesthesia Assessment:                           - Prior to the procedure, a History and Physical                            was performed, and patient medications and                            allergies were reviewed. The patient's tolerance of                            previous anesthesia was also reviewed. The risks                            and benefits of the procedure and the sedation                            options and risks were discussed with the patient.                            All questions were answered, and informed consent                            was obtained. Prior Anticoagulants: The patient has                            taken heparin, last dose was day of procedure. ASA                            Grade Assessment: III - A patient with severe  systemic disease. After reviewing the risks and                            benefits, the patient was deemed in satisfactory                            condition to undergo the procedure.                           After obtaining informed consent, the colonoscope                            was passed under direct vision. Throughout the                            procedure, the patient's blood  pressure, pulse, and                            oxygen saturations were monitored continuously. The                            EC-[id number] ([id number]) scope was introduced through                            the anus and advanced to the the terminal ileum.                            The colonoscopy was performed without difficulty.                            The patient tolerated the procedure well. The                            quality of the bowel preparation was fair. The                            terminal ileum, ileocecal valve, appendiceal                            orifice, and rectum were photographed. Scope In: 1:54:50 PM Scope Out: 2:09:09 PM Scope Withdrawal Time: 0 hours 12 minutes 9 seconds  Total Procedure Duration: 0 hours 14 minutes 19 seconds  Findings:      The perianal and digital rectal examinations were normal.      A 12 mm polyp was found in the sigmoid colon. The polyp was       semi-pedunculated and semi-sessile. The polyp was removed with a hot       snare. Resection and retrieval were complete.      The terminal ileum appeared normal.      A large amount of semi-liquid stool was found in the transverse colon,       in the ascending colon, in the cecum and appendiceal orifice, making       visualization difficult. Lavage of the area was performed, resulting in       clearance with fair visualization.  Non-bleeding internal hemorrhoids were found during retroflexion. The       hemorrhoids were mild and small. Impression:               - Preparation of the colon was fair.                           - One 12 mm polyp in the sigmoid colon, removed                            with a hot snare. Resected and retrieved.                           - The examined portion of the ileum was normal.                           - Stool in the transverse colon, in the ascending                            colon, in the cecum and at the appendiceal orifice.                            - Non-bleeding internal hemorrhoids. Moderate Sedation:      Patient did not receive moderate sedation for this procedure, but       instead received monitored anesthesia care. Recommendation:           - Resume regular diet.                           - Continue present medications.                           - Await pathology results.                           - Repeat colonoscopy in 1 year because the bowel                            preparation was suboptimal.                           - Resume Coumadin (warfarin) at prior dose                            tomorrow. Refer to primary physician for further                            adjustment of therapy.                           - Return patient to hospital ward for ongoing care. Procedure Code(s):        --- Professional ---                           [phone], Colonoscopy, flexible; with removal of  tumor(s), polyp(s), or other lesion(s) by snare                            technique Diagnosis Code(s):        --- Professional ---                           K64.8, Other hemorrhoids                           D12.5, Benign neoplasm of sigmoid colon                           K62.5, Hemorrhage of anus and rectum CPT copyright 2016 American [employer] Association. All rights reserved. The codes documented in this report are preliminary and upon coder review may  be revised to meet current compliance requirements. Ronnette Juniper, MD 07/24/2017 2:16:33 PM This report has been signed electronically. Number of Addenda: 0

## 2017-07-24 NOTE — Progress Notes (Signed)
Endo called report, no new orders.

## 2017-07-24 NOTE — Progress Notes (Signed)
[facility] Progress Note   Subjective:  Seen on HD- plan for colonoscopy later today - no further bleeding Objective Vitals:   07/24/17 0800 07/24/17 0830 07/24/17 0900 07/24/17 0930  BP: 124/83 104/68 103/70 111/71  Pulse: 82 100 98 93  Resp:      Temp:      TempSrc:      SpO2:      Weight:      Height:       Physical Exam General: Pleasant, NAD  Heart: S1,S2, RRR Lungs: CTAB A/P Abdomen: Active BS, SNT Extremities: No LE edema Dialysis Access: RUA AVF + bruit   Additional Objective Labs: Basic Metabolic Panel: Recent Labs  Lab 07/20/17 0743 07/22/17 0745 07/23/17 0542 07/24/17 0725  NA 131* 135 131* 129*  K 6.3* 5.0 4.8 4.8  CL 95* 101 94* 93*  CO2 21* 22 24 22   GLUCOSE 151* 176* 133* 141*  BUN 55* 55* 35* 47*  CREATININE 10.37* 10.24* 7.72* 9.35*  CALCIUM 7.1* 7.6* 7.5* 7.8*  PHOS 6.3* 5.6*  --  6.3*   Liver Function Tests: Recent Labs  Lab 07/20/17 0743 07/22/17 0745 07/24/17 0725  ALBUMIN 3.1* 3.2* 3.4*   No results for input(s): LIPASE, AMYLASE in the last 168 hours. CBC: Recent Labs  Lab 07/21/17 0355 07/22/17 0508 07/23/17 0542 07/24/17 0524 07/24/17 0657  WBC 9.1 8.7 11.2* 11.6* 12.4*  NEUTROABS  --   --  7.5  --   --   HGB 10.5* 9.6* 10.8* 10.7* 10.9*  HCT 30.9* 29.0* 31.7* 30.4* 30.7*  MCV 97.2 97.6 97.2 95.3 95.6  PLT 351 347 361 325 388   Blood Culture    Component Value Date/Time   SDES BLOOD LEFT ANTECUBITAL 09/29/2016 0950   SPECREQUEST BOTTLES DRAWN AEROBIC AND ANAEROBIC  5CC 09/29/2016 0950   CULT NO GROWTH 5 DAYS 09/29/2016 0950   REPTSTATUS 10/04/2016 FINAL 09/29/2016 0950    Cardiac Enzymes: No results for input(s): CKTOTAL, CKMB, CKMBINDEX, TROPONINI in the last 168 hours. CBG: Recent Labs  Lab 07/22/17 2157 07/23/17 0738 07/23/17 1147 07/23/17 1652 07/23/17 2145  GLUCAP 196* 137* 162* 193* 187*   Iron Studies: No results for input(s): IRON, TIBC, TRANSFERRIN, FERRITIN in the last 72  hours. @lablastinr3 @ Studies/Results: No results found. Medications: . sodium chloride    . sodium chloride     . darbepoetin (ARANESP) injection - DIALYSIS  60 mcg Intravenous Q Mon-HD  . doxercalciferol  1 mcg Intravenous Q M,W,F-HD  . insulin aspart  0-5 Units Subcutaneous QHS  . insulin aspart  0-9 Units Subcutaneous TID WC  . lanthanum  1,000 mg Oral TID WC  . multivitamin  1 tablet Oral QHS  . pantoprazole  40 mg Oral Daily  . polyethylene glycol  17 g Oral Daily    Dialysis Orders: T,Th,S [place] 4 hrs 180 NRe 400/800 92 kg 2.0 K/ 2.5 Ca  -Heparin 6000 units IV TIW -Hectorol 1 mcg IV TIW -No ESA/Venofer  BMD meds:  -Auryxia 210 mg 2 tabs TID A/C -Sensipar 60 mg PO q evening  Assessment/Plan: 1. Acute Lower GIB: unclear origin. Supra-therapeutic INR on adm and Rec'd Vit K. Was stable on conservative management but unfortunately started to have bright red blood in stools again. GI has been re-consulted. Going for colonoscopy today. Of course- hgb up and stable once decision for colonoscopy is made   2. H/O DVT/PE: Coumadin on hold per primary- to be restarted after procedure  3.  ESRD -  T,Th,S . HD on Wednesday per holiday schedule, next Sat. No heparin.  4.  Hypertension/volume  - BP well controlled. Was on Losartan 25 mg PO q day, amlodipine 5 mg PO daily but now stopped but does not seem that he needs. Most recent weight 97 pre HD. Usually never gets to EDW of 92- but maybe upon d/c he will be able to  5.  Anemia  - HGB 10.8. No ESA ordered as OP, given here because of dropping hgb  6.  Metabolic bone disease -  on hect and fosrenol  7.  Nutrition - clear liquid diet at present. Albumin 3.4 8.  DM: Per primary.     [name], MD 07/24/2017  ]

## 2017-07-24 NOTE — Anesthesia Preprocedure Evaluation (Addendum)
Anesthesia Evaluation  Patient identified by MRN, date of birth, ID band Patient awake    Reviewed: Allergy & Precautions, NPO status , Patient's Chart, lab work & pertinent test results  Airway Mallampati: II  TM Distance: >3 FB Neck ROM: Full    Dental  (+) Teeth Intact   Pulmonary neg pulmonary ROS,    Pulmonary exam normal breath sounds clear to auscultation       Cardiovascular hypertension, Pt. on medications + Peripheral Vascular Disease and +CHF  Normal cardiovascular exam Rhythm:Regular Rate:Normal  Echo 04/03/17: Impressions:  - Normal LV size with mild LV hypertrophy. EF 55% with basal inferior hypokinesis. Moderate mitral regurgitation. Normal RV size and systolic function. Mass noted in right atrium, likely dialysis catheter tip with adherent thrombus versus vegetation. Could consider further evaluation with TEE.   Neuro/Psych negative neurological ROS     GI/Hepatic GERD  Medicated,(+)     substance abuse  cocaine use, Hematochezia    Endo/Other  diabetes, Type 2, Oral Hypoglycemic Agents  Renal/GU Dialysis and ESRFRenal diseaseK+ 4.3     Musculoskeletal  (+) Arthritis ,   Abdominal   Peds  Hematology  (+) Blood dyscrasia (Coumadin), anemia ,   Anesthesia Other Findings Day of surgery medications reviewed with the patient.  Reproductive/Obstetrics                            Anesthesia Physical Anesthesia Plan  ASA: III  Anesthesia Plan: MAC   Post-op Pain Management:    Induction: Intravenous  PONV Risk Score and Plan: 1 and Ondansetron and Propofol infusion  Airway Management Planned: Nasal Cannula  Additional Equipment:   Intra-op Plan:   Post-operative Plan:   Informed Consent: I have reviewed the patients History and Physical, chart, labs and discussed the procedure including the risks, benefits and alternatives for the proposed anesthesia with the  patient or authorized representative who has indicated his/her understanding and acceptance.   Dental advisory given  Plan Discussed with: CRNA and Anesthesiologist  Anesthesia Plan Comments: (Discussed risks/benefits/alternatives to MAC sedation including need for ventilatory support, hypotension, need for conversion to general anesthesia.  All patient questions answered.  Patient/guardian wishes to proceed.)        Anesthesia Quick Evaluation

## 2017-07-24 NOTE — Progress Notes (Signed)
ANTICOAGULATION CONSULT NOTE - Follow Up Consult  Pharmacy Consult for heparin Indication: h/o PE/DVT  Labs: Recent Labs    07/21/17 0355 07/22/17 0508 07/22/17 0745 07/23/17 0542 07/23/17 1539 07/23/17 2338  HGB 10.5* 9.6*  --  10.8*  --   --   HCT 30.9* 29.0*  --  31.7*  --   --   PLT 351 347  --  361  --   --   LABPROT 22.1* 25.4*  --  25.6* 19.5*  --   INR 1.95 2.34  --  2.36 1.67  --   HEPARINUNFRC 0.51 0.26*  --   --   --  0.30  CREATININE  --   --  10.24* 7.72*  --   --     Assessment/Plan:  56yo male therapeutic on heparin after resumed while Coumadin on hold. Will continue gtt at current rate and confirm stable with am labs.   Wynona Neat, PharmD, BCPS  07/24/2017,12:21 AM

## 2017-07-25 LAB — CBC
HEMATOCRIT: 30.8 % — AB (ref 39.0–52.0)
Hemoglobin: 10.3 g/dL — ABNORMAL LOW (ref 13.0–17.0)
MCH: 32.6 pg (ref 26.0–34.0)
MCHC: 33.4 g/dL (ref 30.0–36.0)
MCV: 97.5 fL (ref 78.0–100.0)
Platelets: 318 10*3/uL (ref 150–400)
RBC: 3.16 MIL/uL — AB (ref 4.22–5.81)
RDW: 14.6 % (ref 11.5–15.5)
WBC: 11 10*3/uL — AB (ref 4.0–10.5)

## 2017-07-25 LAB — BASIC METABOLIC PANEL
ANION GAP: 13 (ref 5–15)
BUN: 40 mg/dL — ABNORMAL HIGH (ref 6–20)
CALCIUM: 7.9 mg/dL — AB (ref 8.9–10.3)
CO2: 25 mmol/L (ref 22–32)
Chloride: 92 mmol/L — ABNORMAL LOW (ref 101–111)
Creatinine, Ser: 8.22 mg/dL — ABNORMAL HIGH (ref 0.61–1.24)
GFR calc non Af Amer: 6 mL/min — ABNORMAL LOW (ref >60)
GFR, EST AFRICAN AMERICAN: 7 mL/min — AB (ref >60)
Glucose, Bld: 176 mg/dL — ABNORMAL HIGH (ref 65–99)
POTASSIUM: 4.4 mmol/L (ref 3.5–5.1)
Sodium: 130 mmol/L — ABNORMAL LOW (ref 135–145)

## 2017-07-25 LAB — GLUCOSE, CAPILLARY
GLUCOSE-CAPILLARY: 307 mg/dL — AB (ref 65–99)
GLUCOSE-CAPILLARY: 228 mg/dL — AB (ref 65–99)
Glucose-Capillary: 172 mg/dL — ABNORMAL HIGH (ref 65–99)
Glucose-Capillary: 325 mg/dL — ABNORMAL HIGH (ref 65–99)

## 2017-07-25 LAB — HEPARIN LEVEL (UNFRACTIONATED): HEPARIN UNFRACTIONATED: 0.14 [IU]/mL — AB (ref 0.30–0.70)

## 2017-07-25 LAB — PROTIME-INR
INR: 1.17
Prothrombin Time: 14.8 seconds (ref 11.4–15.2)

## 2017-07-25 MED ORDER — WARFARIN SODIUM 7.5 MG PO TABS
7.5000 mg | ORAL_TABLET | Freq: Once | ORAL | Status: AC
  Administered 2017-07-25: 7.5 mg via ORAL
  Filled 2017-07-25: qty 1

## 2017-07-25 MED ORDER — HEPARIN (PORCINE) IN NACL 100-0.45 UNIT/ML-% IJ SOLN
1350.0000 [IU]/h | INTRAMUSCULAR | Status: DC
  Administered 2017-07-25: 1200 [IU]/h via INTRAVENOUS
  Administered 2017-07-26: 1350 [IU]/h via INTRAVENOUS
  Filled 2017-07-25 (×3): qty 250

## 2017-07-25 MED ORDER — WARFARIN - PHARMACIST DOSING INPATIENT: Freq: Every day | Status: DC

## 2017-07-25 NOTE — Progress Notes (Signed)
[facility] Progress Note   Subjective:   Seen in room, feels ok today. No CP or dyspnea. No further melena today. S/p colonoscopy 11/21=hemorrhoids.  Objective Vitals:   07/24/17 1454 07/24/17 1651 07/24/17 2100 07/25/17 0449  BP: 137/81 (!) 115/99 103/68 100/67  Pulse: 88 (!) 114 (!) 106 100  Resp: 16 17 18 18   Temp: 97.6 F (36.4 C) 97.8 F (36.6 C) 98 F (36.7 C) 98.5 F (36.9 C)  TempSrc: Oral Oral Oral Oral  SpO2: 100% 99% 99% 99%  Weight:   97 kg (213 lb 13.5 oz)   Height:       Physical Exam General: Well appearing male, NAD Heart:RRR; no murmur Lungs: CTAB Abdomen: soft, non-tender Extremities: No LE edema Dialysis Access: RUE AVF + thrill  Additional Objective Labs: Basic Metabolic Panel: Recent Labs  Lab 07/20/17 0743 07/22/17 0745 07/23/17 0542 07/24/17 0725 07/24/17 1212 07/25/17 0305  NA 131* 135 131* 129* 132* 130*  K 6.3* 5.0 4.8 4.8 4.3 4.4  CL 95* 101 94* 93*  --  92*  CO2 21* 22 24 22   --  25  GLUCOSE 151* 176* 133* 141* 155* 176*  BUN 55* 55* 35* 47*  --  40*  CREATININE 10.37* 10.24* 7.72* 9.35*  --  8.22*  CALCIUM 7.1* 7.6* 7.5* 7.8*  --  7.9*  PHOS 6.3* 5.6*  --  6.3*  --   --    Liver Function Tests: Recent Labs  Lab 07/20/17 0743 07/22/17 0745 07/24/17 0725  ALBUMIN 3.1* 3.2* 3.4*   CBC: Recent Labs  Lab 07/22/17 0508 07/23/17 0542 07/24/17 0524 07/24/17 0657 07/24/17 1212 07/25/17 0305  WBC 8.7 11.2* 11.6* 12.4*  --  11.0*  NEUTROABS  --  7.5  --   --   --   --   HGB 9.6* 10.8* 10.7* 10.9* 11.2* 10.3*  HCT 29.0* 31.7* 30.4* 30.7* 33.0* 30.8*  MCV 97.6 97.2 95.3 95.6  --  97.5  PLT 347 361 325 388  --  318  CBG: Recent Labs  Lab 07/23/17 2145 07/24/17 1525 07/24/17 1648 07/24/17 2136 07/25/17 0752  GLUCAP 187* 188* 156* 270* 172*   Medications:  . darbepoetin (ARANESP) injection - DIALYSIS  60 mcg Intravenous Q Mon-HD  . doxercalciferol  1 mcg Intravenous Q M,W,F-HD  . insulin aspart  0-5  Units Subcutaneous QHS  . insulin aspart  0-9 Units Subcutaneous TID WC  . lanthanum  1,000 mg Oral TID WC  . multivitamin  1 tablet Oral QHS  . pantoprazole  40 mg Oral Daily  . polyethylene glycol  17 g Oral Daily    Dialysis Orders: TTS at [facility] 4 hrs 180 NRe 400/800 92 kg 2.0 K/ 2.5 Ca  -Heparin 6000 units IV TIW -Hectorol 1 mcg IV TIW -No ESA/Venofer  BMD meds:  -Auryxia 210 mg 2 tabs TID A/C -Sensipar 60 mg PO q evening   Assessment/Plan: 1. Acute LowerGIB: Supra-therapeutic INR on admit requiring Vit K. Was stable on conservative management but unfortunately started to have bright red blood in stools again. GI re-consulted, underwent colonoscopy 11/21 showing internal hemorrhoids and one polyp. 2.  Hx DVT/PE: Coumadin held for above, now being resumed and awaiting INR to come up- was 1.17 today. 3. ESRD: Usual TTS schedule, last HD Wed 11/21. Next 11/24. No heparin with HD for now. 4. Hypertension/volume: BP well controlled. Was on Losartan 25 mg PO q day, amlodipine 5 mg PO daily but now stopped but does  not seem that he needs. Most recent weight 97 pre HD. Usually never gets to EDW of 92- but maybe upon d/c he will be able to  5. Anemia: Hgb 10.3. Continue Aranesp 30mcg weekly for now. 6. Metabolic bone disease: Ca ok, Phos high. Continue Fosrenol and Hectoral, sensipar on hold d/t low Ca. 7. Nutrition: Alb low, advancing diet today. 8. DM: Per primary.   Veneta Penton, PA-C 07/25/2017, 8:46 AM  [facility] Pager: [phone]  Patient seen and examined, agree with above note with above modifications. No new complaints, no more bleeding- unable to discharge until INR is up.  Next HD Saturday either here or as OP  [name], MD 07/25/2017

## 2017-07-25 NOTE — Progress Notes (Signed)
ANTICOAGULATION CONSULT NOTE - Follow Up Consult  Pharmacy Consult for Heparin  Indication: hx PE and DVT  Allergies  Allergen Reactions  . No Known Allergies     Patient Measurements: Height: 5\' 11"  (180.3 cm) Weight: 213 lb 13.5 oz (97 kg) IBW/kg (Calculated) : 75.3 Heparin Dosing Weight: 94 kg  Vital Signs: Temp: 98.2 F (36.8 C) (11/22 2111) Temp Source: Oral (11/22 2111) BP: 128/84 (11/22 2111) Pulse Rate: 99 (11/22 2111)  Labs: Recent Labs    07/23/17 0542 07/23/17 1539 07/23/17 2338 07/24/17 0524 07/24/17 0657 07/24/17 0725 07/24/17 1212 07/25/17 0305 07/25/17 2237  HGB 10.8*  --   --  10.7* 10.9*  --  11.2* 10.3*  --   HCT 31.7*  --   --  30.4* 30.7*  --  33.0* 30.8*  --   PLT 361  --   --  325 388  --   --  318  --   LABPROT 25.6* 19.5*  --   --  15.2  --   --  14.8  --   INR 2.36 1.67  --   --  1.21  --   --  1.17  --   HEPARINUNFRC  --   --  0.30  --   --   --   --   --  0.14*  CREATININE 7.72*  --   --   --   --  9.35*  --  8.22*  --     Assessment:  56 yo M admitted 07/15/17 with GIB and INR 4.13, On Coumadin 5mg  daily PTA for hx RLE DVT and PE, Coumadin held on admission. Vitamin K 1mg  IV given on 11/12 pm. Heparin bridge and warfarin resumed on 11/14, and INR reached goal (2.34) on 11/19.  then held again due to further bloody stools.  Vitamin K 5 mg IV given on 11/20.  Colonoscopy done on 11/21; small internal hemorrhoids noted, 1 polyp removed.   Heparin and Warfarin resumed 11/22, first heparin level tonight is low at 0.14, no issues per RN.    Goal of Therapy:  INR 2-3 Heparin level 0.3-0.5 units/ml Monitor platelets by anticoagulation protocol: Yes   Plan:  Inc heparin drip to 1350 units/hr 0800 HL  Narda Bonds, PharmD, BCPS Clinical Pharmacist Phone: [phone]

## 2017-07-25 NOTE — Progress Notes (Signed)
PROGRESS NOTE    KOLLYN LINGAFELTER  YIF:[id number] DOB: Jan 12, 1961 DOA: 07/15/2017 PCP: Arnoldo Morale, MD  Brief Narrative:Narrative  56 ESRD on TTS HD, HTN, DM 2, chronic diastolic CHF, GERD, PAD, PE/right femoral DVT 04/02/17 on Coumadin managed at [facility], presented to ED on 07/15/17 with rectal bleeding, INR 4.1 and hemoglobin 11.7.  Supratherapeutic INR was reversed with vitamin K. Rectal bleeding has resolved.  GI was consulted and recommend conservative management.  Nephrology consulted for dialysis needs. Resume Coumadin with IV heparin bridging. DC home when INR therapeuticABOVE 2.   Assessment & Plan:   Active Problems:   Acute GI bleeding   Hyperkalemia  Acute lower GI bleeding 2/2 hemorrhoids vs semi-pedunculated polyp on colonoscopy 11.21 -In the setting of elevated INR 4.13 on presentation -INR 1.21 today 07/24/17 -pharmacy managing coumadin and started back on heparin 11/22 as per GI instructions to hold for 24 hours post colonoscopy  Subtherapeutic INR -INR 1.21 -resume heparin and coumadin 11/22 -Monitor closely overnight. -No SCDs due to DVT   ESRD on HD TTH SAT -keep dialysis appointments -HD next will be on Saturday  Hyponatremia, possibly dilutional -improving -euvolemic -na+ 132 from 129 -BMP am  HTN- -losartan  Type 2 DM  -glipizide -BMP am -Sugars are ranging between 270 and 170 on sliding scale coverage, glipizide has been held from prior to admission  Anemia of chronic disease -Additional component 2/2 ESRD -stable -stopped bleeding -hg 11.2 -CBC am -Continue Aranesp 60 Q Monday    DVT prophylaxis start heparin drip and overlap with Coumadin Code Status:full Family Communication: none Disposition Plan: Needs to be therapeutic on INR prior to discharge >2 given recent DVT Consultants: GI  Procedures:colonoscopy today 07/24/17  Antimicrobials: none   Objective: Vitals:   07/24/17  1454 07/24/17 1651 07/24/17 2100 07/25/17 0449  BP: 137/81 (!) 115/99 103/68 100/67  Pulse: 88 (!) 114 (!) 106 100  Resp: 16 17 18 18   Temp: 97.6 F (36.4 C) 97.8 F (36.6 C) 98 F (36.7 C) 98.5 F (36.9 C)  TempSrc: Oral Oral Oral Oral  SpO2: 100% 99% 99% 99%  Weight:   97 kg (213 lb 13.5 oz)   Height:        Intake/Output Summary (Last 24 hours) at 07/25/2017 1015 Last data filed at 07/25/2017 0600 Gross per 24 hour  Intake 812 ml  Output 3225 ml  Net -2413 ml   Filed Weights   07/24/17 1100 07/24/17 1232 07/24/17 2100  Weight: 98 kg (216 lb 0.8 oz) 98 kg (216 lb 0.8 oz) 97 kg (213 lb 13.5 oz)    Examination:  General exam: 56 yo Male WD WN NAD A&O x3 Respiratory system: Clear to auscultation.  Clear without added sound Cardiovascular system: S1 & S2 heard, RRR. No JVD Gastrointestinal system: Abdomen is nondistended, soft and nontender. No organomegaly or masses felt. Normal bowel sounds heard. Central nervous system: Alert and oriented. No focal neurological deficits. Extremities: Symmetric 5 x 5 power. Skin: No rashes, lesions or open ulcers Psychiatry: Judgement and insight appear normal. Mood & affect appropriate.     Data Reviewed: I have personally reviewed following labs and imaging studies  CBC: Recent Labs  Lab 07/22/17 0508 07/23/17 0542 07/24/17 0524 07/24/17 0657 07/24/17 1212 07/25/17 0305  WBC 8.7 11.2* 11.6* 12.4*  --  11.0*  NEUTROABS  --  7.5  --   --   --   --   HGB 9.6* 10.8* 10.7* 10.9*  11.2* 10.3*  HCT 29.0* 31.7* 30.4* 30.7* 33.0* 30.8*  MCV 97.6 97.2 95.3 95.6  --  97.5  PLT 347 361 325 388  --  761   Basic Metabolic Panel: Recent Labs  Lab 07/20/17 0743 07/22/17 0745 07/23/17 0542 07/24/17 0725 07/24/17 1212 07/25/17 0305  NA 131* 135 131* 129* 132* 130*  K 6.3* 5.0 4.8 4.8 4.3 4.4  CL 95* 101 94* 93*  --  92*  CO2 21* 22 24 22   --  25  GLUCOSE 151* 176* 133* 141* 155* 176*  BUN 55* 55* 35* 47*  --  40*  CREATININE  10.37* 10.24* 7.72* 9.35*  --  8.22*  CALCIUM 7.1* 7.6* 7.5* 7.8*  --  7.9*  PHOS 6.3* 5.6*  --  6.3*  --   --    GFR: Estimated Creatinine Clearance: 11.9 mL/min (A) (by C-G formula based on SCr of 8.22 mg/dL (H)). Liver Function Tests: Recent Labs  Lab 07/20/17 0743 07/22/17 0745 07/24/17 0725  ALBUMIN 3.1* 3.2* 3.4*   No results for input(s): LIPASE, AMYLASE in the last 168 hours. No results for input(s): AMMONIA in the last 168 hours. Coagulation Profile: Recent Labs  Lab 07/22/17 0508 07/23/17 0542 07/23/17 1539 07/24/17 0657 07/25/17 0305  INR 2.34 2.36 1.67 1.21 1.17   Cardiac Enzymes: No results for input(s): CKTOTAL, CKMB, CKMBINDEX, TROPONINI in the last 168 hours. BNP (last 3 results) No results for input(s): PROBNP in the last 8760 hours. HbA1C: No results for input(s): HGBA1C in the last 72 hours. CBG: Recent Labs  Lab 07/23/17 2145 07/24/17 1525 07/24/17 1648 07/24/17 2136 07/25/17 0752  GLUCAP 187* 188* 156* 270* 172*   Lipid Profile: No results for input(s): CHOL, HDL, LDLCALC, TRIG, CHOLHDL, LDLDIRECT in the last 72 hours. Thyroid Function Tests: No results for input(s): TSH, T4TOTAL, FREET4, T3FREE, THYROIDAB in the last 72 hours. Anemia Panel: No results for input(s): VITAMINB12, FOLATE, FERRITIN, TIBC, IRON, RETICCTPCT in the last 72 hours. Sepsis Labs: No results for input(s): PROCALCITON, LATICACIDVEN in the last 168 hours.  Recent Results (from the past 240 hour(s))  MRSA PCR Screening     Status: None   Collection Time: 07/16/17  3:54 AM  Result Value Ref Range Status   MRSA by PCR NEGATIVE NEGATIVE Final    Comment:        The GeneXpert MRSA Assay (FDA approved for NASAL specimens only), is one component of a comprehensive MRSA colonization surveillance program. It is not intended to diagnose MRSA infection nor to guide or monitor treatment for MRSA infections.          Radiology Studies: No results  found.    Scheduled Meds: . darbepoetin (ARANESP) injection - DIALYSIS  60 mcg Intravenous Q Mon-HD  . doxercalciferol  1 mcg Intravenous Q M,W,F-HD  . insulin aspart  0-5 Units Subcutaneous QHS  . insulin aspart  0-9 Units Subcutaneous TID WC  . lanthanum  1,000 mg Oral TID WC  . multivitamin  1 tablet Oral QHS  . pantoprazole  40 mg Oral Daily  . polyethylene glycol  17 g Oral Daily   Continuous Infusions:    LOS: 10 days    Verneita Griffes, MD [facility] [phone]

## 2017-07-25 NOTE — Progress Notes (Signed)
ANTICOAGULATION CONSULT NOTE - Follow Up Consult  Pharmacy Consult for Heparin and Coumadin Indication: hx PE and DVT  Allergies  Allergen Reactions  . No Known Allergies     Patient Measurements: Height: 5\' 11"  (180.3 cm) Weight: 213 lb 13.5 oz (97 kg) IBW/kg (Calculated) : 75.3 Heparin Dosing Weight: 94 kg  Vital Signs: Temp: 98.5 F (36.9 C) (11/22 0449) Temp Source: Oral (11/22 0449) BP: 100/67 (11/22 0449) Pulse Rate: 100 (11/22 0449)  Labs: Recent Labs    07/23/17 0542 07/23/17 1539 07/23/17 2338 07/24/17 0524 07/24/17 0657 07/24/17 0725 07/24/17 1212 07/25/17 0305  HGB 10.8*  --   --  10.7* 10.9*  --  11.2* 10.3*  HCT 31.7*  --   --  30.4* 30.7*  --  33.0* 30.8*  PLT 361  --   --  325 388  --   --  318  LABPROT 25.6* 19.5*  --   --  15.2  --   --  14.8  INR 2.36 1.67  --   --  1.21  --   --  1.17  HEPARINUNFRC  --   --  0.30  --   --   --   --   --   CREATININE 7.72*  --   --   --   --  9.35*  --  8.22*    Assessment:  56 yo M admitted 07/15/17 with GIB and INR 4.13, On Coumadin 5mg  daily PTA for hx RLE DVT and PE, Coumadin held on admission. Vitamin K 1mg  IV given on 11/12 pm. Heparin bridge and warfarin resumed on 11/14, and INR reached goal (2.34) on 11/19.  then held again due to further bloody stools.  Vitamin K 5 mg IV given on 11/20.  Colonoscopy done on 11/21; small internal hemorrhoids noted, 1 polyp removed.      Heparin and Coumadin to resume this afternoon, 24hrs after colonoscopy.      Last heparin level was therapeutic (0.30) on 1200 units/hr prior to holding for procedure.  Targeting low therapeutic heparin levels (0.3-0.5)    INR down to 1.17.   Goal of Therapy:  INR 2-3 Heparin level 0.3-0.5 units/ml Monitor platelets by anticoagulation protocol: Yes   Plan:   Resume heparin drip at 2:30pm today at 1200 units/hr.  Heparin level ~8 hrs after drip resumes.  Continue to target low therapeutic heparin levels.  Coumadin 7.5 mg x 1  today.  Daily heparin level, PT/INR and CBC.  Monitor for any bleeding.  Arty Baumgartner, [place] Pager: [phone] 07/25/2017,2:15 PM

## 2017-07-26 ENCOUNTER — Encounter (HOSPITAL_COMMUNITY): Payer: Self-pay | Admitting: Gastroenterology

## 2017-07-26 LAB — BASIC METABOLIC PANEL
ANION GAP: 15 (ref 5–15)
BUN: 58 mg/dL — AB (ref 6–20)
CALCIUM: 8 mg/dL — AB (ref 8.9–10.3)
CO2: 23 mmol/L (ref 22–32)
Chloride: 92 mmol/L — ABNORMAL LOW (ref 101–111)
Creatinine, Ser: 10.92 mg/dL — ABNORMAL HIGH (ref 0.61–1.24)
GFR calc Af Amer: 5 mL/min — ABNORMAL LOW (ref >60)
GFR, EST NON AFRICAN AMERICAN: 5 mL/min — AB (ref >60)
GLUCOSE: 243 mg/dL — AB (ref 65–99)
Potassium: 4.5 mmol/L (ref 3.5–5.1)
Sodium: 130 mmol/L — ABNORMAL LOW (ref 135–145)

## 2017-07-26 LAB — CBC WITH DIFFERENTIAL/PLATELET
Basophils Absolute: 0.1 10*3/uL (ref 0.0–0.1)
Basophils Relative: 0 %
EOS ABS: 0.3 10*3/uL (ref 0.0–0.7)
Eosinophils Relative: 3 %
HCT: 27.3 % — ABNORMAL LOW (ref 39.0–52.0)
Hemoglobin: 9.5 g/dL — ABNORMAL LOW (ref 13.0–17.0)
LYMPHS ABS: 2.9 10*3/uL (ref 0.7–4.0)
LYMPHS PCT: 26 %
MCH: 33.3 pg (ref 26.0–34.0)
MCHC: 34.8 g/dL (ref 30.0–36.0)
MCV: 95.8 fL (ref 78.0–100.0)
Monocytes Absolute: 1 10*3/uL (ref 0.1–1.0)
Monocytes Relative: 9 %
NEUTROS PCT: 62 %
Neutro Abs: 7 10*3/uL (ref 1.7–7.7)
PLATELETS: 333 10*3/uL (ref 150–400)
RBC: 2.85 MIL/uL — AB (ref 4.22–5.81)
RDW: 14.2 % (ref 11.5–15.5)
WBC: 11.3 10*3/uL — AB (ref 4.0–10.5)

## 2017-07-26 LAB — PROTIME-INR
INR: 1.04
Prothrombin Time: 13.5 seconds (ref 11.4–15.2)

## 2017-07-26 LAB — HEPARIN LEVEL (UNFRACTIONATED): HEPARIN UNFRACTIONATED: 0.38 [IU]/mL (ref 0.30–0.70)

## 2017-07-26 LAB — GLUCOSE, CAPILLARY
GLUCOSE-CAPILLARY: 243 mg/dL — AB (ref 65–99)
GLUCOSE-CAPILLARY: 260 mg/dL — AB (ref 65–99)
GLUCOSE-CAPILLARY: 295 mg/dL — AB (ref 65–99)
Glucose-Capillary: 252 mg/dL — ABNORMAL HIGH (ref 65–99)

## 2017-07-26 MED ORDER — DOXERCALCIFEROL 4 MCG/2ML IV SOLN
1.0000 ug | INTRAVENOUS | Status: DC
  Administered 2017-07-30: 1 ug via INTRAVENOUS
  Filled 2017-07-26: qty 2

## 2017-07-26 MED ORDER — WARFARIN SODIUM 7.5 MG PO TABS
7.5000 mg | ORAL_TABLET | Freq: Once | ORAL | Status: AC
  Administered 2017-07-26: 7.5 mg via ORAL
  Filled 2017-07-26: qty 1

## 2017-07-26 MED ORDER — INSULIN DETEMIR 100 UNIT/ML ~~LOC~~ SOLN
5.0000 [IU] | Freq: Every day | SUBCUTANEOUS | Status: DC
  Administered 2017-07-26 – 2017-07-30 (×4): 5 [IU] via SUBCUTANEOUS
  Filled 2017-07-26 (×5): qty 0.05

## 2017-07-26 MED ORDER — ONDANSETRON 4 MG PO TBDP
4.0000 mg | ORAL_TABLET | Freq: Three times a day (TID) | ORAL | Status: DC | PRN
  Administered 2017-07-26 – 2017-07-27 (×2): 4 mg via ORAL
  Filled 2017-07-26 (×5): qty 1

## 2017-07-26 NOTE — Discharge Instructions (Signed)

## 2017-07-26 NOTE — Progress Notes (Signed)
[facility] Progress Note   Subjective: Sitting up at bedside eating. No C/Os. No reports of bloody stools. Concerned about getting out of hospital in time for court date next Wednesday.  When I see him later AM he is nauseated   Objective Vitals:   07/25/17 0900 07/25/17 1732 07/25/17 2111 07/26/17 0526  BP: 105/66 112/78 128/84 119/80  Pulse: 88 99 99 96  Resp: 18 18 16 16   Temp: 98.6 F (37 C) 98.4 F (36.9 C) 98.2 F (36.8 C) 98.3 F (36.8 C)  TempSrc: Oral Oral Oral Oral  SpO2: 97% 100% 100% 98%  Weight:      Height:       Physical Exam General: Well appearing, NAD Heart:S1,S2 RRR Lungs:CTAB Abdomen:SNT Extremities:No LE edema Dialysis Access: RUA AVF + bruit    Additional Objective Labs: Basic Metabolic Panel: Recent Labs  Lab 07/20/17 0743 07/22/17 0745 07/23/17 0542 07/24/17 0725 07/24/17 1212 07/25/17 0305  NA 131* 135 131* 129* 132* 130*  K 6.3* 5.0 4.8 4.8 4.3 4.4  CL 95* 101 94* 93*  --  92*  CO2 21* 22 24 22   --  25  GLUCOSE 151* 176* 133* 141* 155* 176*  BUN 55* 55* 35* 47*  --  40*  CREATININE 10.37* 10.24* 7.72* 9.35*  --  8.22*  CALCIUM 7.1* 7.6* 7.5* 7.8*  --  7.9*  PHOS 6.3* 5.6*  --  6.3*  --   --    Liver Function Tests: Recent Labs  Lab 07/20/17 0743 07/22/17 0745 07/24/17 0725  ALBUMIN 3.1* 3.2* 3.4*   No results for input(s): LIPASE, AMYLASE in the last 168 hours. CBC: Recent Labs  Lab 07/23/17 0542 07/24/17 0524 07/24/17 0657 07/24/17 1212 07/25/17 0305 07/26/17 0718  WBC 11.2* 11.6* 12.4*  --  11.0* 11.3*  NEUTROABS 7.5  --   --   --   --  7.0  HGB 10.8* 10.7* 10.9* 11.2* 10.3* 9.5*  HCT 31.7* 30.4* 30.7* 33.0* 30.8* 27.3*  MCV 97.2 95.3 95.6  --  97.5 95.8  PLT 361 325 388  --  318 333   Blood Culture    Component Value Date/Time   SDES BLOOD LEFT ANTECUBITAL 09/29/2016 0950   SPECREQUEST BOTTLES DRAWN AEROBIC AND ANAEROBIC  5CC 09/29/2016 0950   CULT NO GROWTH 5 DAYS 09/29/2016 0950    REPTSTATUS 10/04/2016 FINAL 09/29/2016 0950    Cardiac Enzymes: No results for input(s): CKTOTAL, CKMB, CKMBINDEX, TROPONINI in the last 168 hours. CBG: Recent Labs  Lab 07/24/17 2136 07/25/17 0752 07/25/17 1150 07/25/17 1729 07/25/17 2109  GLUCAP 270* 172* 307* 228* 325*   Iron Studies: No results for input(s): IRON, TIBC, TRANSFERRIN, FERRITIN in the last 72 hours. @lablastinr3 @ Studies/Results: No results found. Medications: . heparin 1,350 Units/hr (07/25/17 2345)   . darbepoetin (ARANESP) injection - DIALYSIS  60 mcg Intravenous Q Mon-HD  . doxercalciferol  1 mcg Intravenous Q M,W,F-HD  . insulin aspart  0-5 Units Subcutaneous QHS  . insulin aspart  0-9 Units Subcutaneous TID WC  . lanthanum  1,000 mg Oral TID WC  . multivitamin  1 tablet Oral QHS  . pantoprazole  40 mg Oral Daily  . polyethylene glycol  17 g Oral Daily  . Warfarin - Pharmacist Dosing Inpatient   Does not apply q1800     Dialysis Orders: TTS at [facility] 4 hrs 180 NRe 400/800 92 kg 2.0 K/ 2.5 Ca  -Heparin 6000 units IV TIW -Hectorol 1 mcg IV TIW -No  ESA/Venofer  BMD meds:  -Auryxia 210 mg 2 tabs TID A/C -Sensipar 60 mg PO q evening   Assessment/Plan: 1. Acute LowerGIB: Supra-therapeutic INR on admit requiring Vit K. Was stable on conservative management but unfortunately started to have bright red blood in stools again. GI re-consulted, underwent colonoscopy 11/21 showing internal hemorrhoids and one polyp. Awaiting therapeutic INR for DC however tells me he has court date next Wednesday and is worried about getting out of the hospital on time. Will speak to primary.  2.  Hx DVT/PE: Coumadin held for above, now being resumed and awaiting INR to come up- was 1.04 today. 3. ESRD: Usual TTS schedule, last HD Wed 11/21. Next 11/24. No heparin with HD for now. 4. Hypertension/volume: BP well controlled.Was onLosartan 25 mg PO q day, amlodipine 5 mg PO dailybut now stopped but does not seem that  he needs.Not getting to OP EDW. Hyponatremia argues for volume overload - will push it tomorrow 5. Anemia: Hgb 9.5. Continue Aranesp 56mcg weekly for now. Last dose 07/22/17.  6. Metabolic bone disease: Ca ok, Phos high. Continue Fosrenol and Hectoral, sensipar on hold d/t low Ca. 7. Nutrition: Alb low, Renal diet/fld restrictions. Renal vit/nepro.  8. DM:Per primary. BS high this AM 242. Needs better control.    Rita H. Brown NP-C 07/26/2017, 8:18 AM  [facility] [phone]  Patient seen and examined, agree with above note with above modifications. Is nauseated this AM- not sure why - INR is normal today- would it be possible to do lovenox bridging ?? Plan for next HD tomorrow - push UF due to hyponatremia  [name], MD 07/26/2017

## 2017-07-26 NOTE — Progress Notes (Signed)
ANTICOAGULATION CONSULT NOTE - Follow Up Consult  Pharmacy Consult for Heparin and Coumadin Indication: hx PE and DVT  Allergies  Allergen Reactions  . No Known Allergies     Patient Measurements: Height: 5\' 11"  (180.3 cm) Weight: 213 lb 13.5 oz (97 kg) IBW/kg (Calculated) : 75.3 Heparin Dosing Weight: 94 kg  Vital Signs: Temp: 98.4 F (36.9 C) (11/23 0840) Temp Source: Oral (11/23 0840) BP: 113/72 (11/23 0840) Pulse Rate: 92 (11/23 0840)  Labs: Recent Labs    07/23/17 2338  07/24/17 0657 07/24/17 0725 07/24/17 1212 07/25/17 0305 07/25/17 2237 07/26/17 0718  HGB  --    < > 10.9*  --  11.2* 10.3*  --  9.5*  HCT  --    < > 30.7*  --  33.0* 30.8*  --  27.3*  PLT  --    < > 388  --   --  318  --  333  LABPROT  --   --  15.2  --   --  14.8  --  13.5  INR  --   --  1.21  --   --  1.17  --  1.04  HEPARINUNFRC 0.30  --   --   --   --   --  0.14* 0.38  CREATININE  --   --   --  9.35*  --  8.22*  --  10.92*   < > = values in this interval not displayed.    Assessment:  56 yo M admitted 07/15/17 with GIB and INR 4.13, On Coumadin 5mg  daily PTA for hx RLE DVT and PE (04/02/17). Coumadin held on admission. Vitamin K 1mg  IV given on 11/12 pm. Heparin bridge and warfarin resumed on 11/14, and INR reached goal (2.34) on 11/19.  then held again due to further bloody stools.  Vitamin K 5 mg IV given on 11/20.  Colonoscopy done on 11/21; small internal hemorrhoids noted, 1 polyp removed.      Heparin and Coumadin to resumed yesterday, 24hrs after colonoscopy.      Initial heparin level low (0.14) on prior rate of 1200 units/hr, but heparin level is now therapeutic (0.38) on 1350 units/hr. Targeting low therapeutic heparin levels (0.3-0.5).    Hgb trended down, patient denies bleeding.    Coumadin resumed with 7.5 mg on 11/22, INR down to 1.04.    Took 5 days to reach therapeutic INR earlier this admit, so expect same time frame.    PTA Coumadin dose: 5 mg daily.  INR 4.13 on  admit.  Goal of Therapy:  INR 2-3 Heparin level 0.3-0.5 units/ml Monitor platelets by anticoagulation protocol: Yes   Plan:   Countinue heparin drip at 1350 units/hr.  Continue to target low therapeutic heparin levels.  Coumadin 7.5 mg again today.  Daily heparin level, PT/INR and CBC.  Monitor for any bleeding.  Arty Baumgartner, [place] Pager: [phone] 07/26/2017,10:52 AM

## 2017-07-26 NOTE — Progress Notes (Signed)
PROGRESS NOTE    [name]  FAO:[id number] DOB: 10-Jul-1961 DOA: 07/15/2017 PCP: Arnoldo Morale, MD  Brief Narrative:Narrative  56 [facility] [place] S/p atherectomyright upper extremity fistula with successful use of the same Diastolic heart failure last EF 04/04/2017 normal RV function EF 55%?  Underwent Lexiscan 10/12/2016 EF 41% no ST segment deviation and it was a low risk study mass right atrium possible dialysis hip with adherent thrombus Cocaine HTN Metabolic bone disease Previous homelessness Elevated troponin without CAD-probably secondary to strain pattern from recent PE Diabetes mellitus type TY 2 Prior Acute PE Right fem DVT   Nephrology consulted for dialysis needs. Resume Coumadin with IV heparin bridging. DC home when INR therapeuticABOVE 2.   Assessment & Plan:   Active Problems:   Acute GI bleeding   Hyperkalemia  Acute lower GI bleeding 2/2 hemorrhoids vs semi-pedunculated polyp on colonoscopy 11.21 -In the setting of elevated INR 4.13 on presentation -INR 1.21-->1.04 today 11/223 -heparin to coumadin bridge--will consider lovenox bridging in OP setting   Nausea Unclear casue Adding zofran 4 tid Reassess in am  Subtherapeutic INR -INR 1.04 -resumed heparin and coumadin 11/22 -no bleed thus far -No SCDs due to DVT   ESRD on HD TTH SAT -keep dialysis appointments -HD next will be on Saturday  Hyponatremia, possibly dilutional -improving -euvolemic -na+ 132 from 129 -BMP am  HTN- -losartan  Type 2 DM  -glipizide -BMP am -Sugars are ranging between 243--307 on sliding scale coverage, glipizide has been held from prior to admission -adding levemir 5 u  Anemia of chronic disease -Additional component 2/2 ESRD -no futher bleed -hg 11.2-->9.5 -CBC am -Continue Aranesp 60 Q Monday    DVT prophylaxis start heparin drip and overlap with Coumadin Code Status:full Family  Communication: none Disposition Plan: Needs to be therapeutic on INR prior to discharge >2 given recent DVT Consultants: GI  Procedures:colonoscopy today 07/24/17  Antimicrobials: none   Nauseous this am and chest discomfort No vomit No fever  No chills No rash  Objective: Vitals:   07/25/17 1732 07/25/17 2111 07/26/17 0526 07/26/17 0840  BP: 112/78 128/84 119/80 113/72  Pulse: 99 99 96 92  Resp: 18 16 16 18   Temp: 98.4 F (36.9 C) 98.2 F (36.8 C) 98.3 F (36.8 C) 98.4 F (36.9 C)  TempSrc: Oral Oral Oral Oral  SpO2: 100% 100% 98% 98%  Weight:      Height:        Intake/Output Summary (Last 24 hours) at 07/26/2017 1458 Last data filed at 07/26/2017 1300 Gross per 24 hour  Intake 864.75 ml  Output 500 ml  Net 364.75 ml   Filed Weights   07/24/17 1100 07/24/17 1232 07/24/17 2100  Weight: 98 kg (216 lb 0.8 oz) 98 kg (216 lb 0.8 oz) 97 kg (213 lb 13.5 oz)    Examination:  General exam: 56 yo Male WD WN NAD A&O x3 Respiratory system: Clear to auscultation.   Cardiovascular system: S1 & S2 heard, RRR. No JVD Gastrointestinal system: Abdomen is nondistended, soft and nontender.  Central nervous system: Alert and oriented. No focal neurological deficits. Extremities: Symmetric 5 x 5 power. Skin: No rashes, lesions or open ulcers Psychiatry: Judgement and insight appear normal. Mood & affect appropriate.     Data Reviewed: I have personally reviewed following labs and imaging studies  CBC: Recent Labs  Lab 07/23/17 0542 07/24/17 0524 07/24/17 0657 07/24/17 1212 07/25/17 0305 07/26/17 0718  WBC 11.2* 11.6* 12.4*  --  11.0* 11.3*  NEUTROABS 7.5  --   --   --   --  7.0  HGB 10.8* 10.7* 10.9* 11.2* 10.3* 9.5*  HCT 31.7* 30.4* 30.7* 33.0* 30.8* 27.3*  MCV 97.2 95.3 95.6  --  97.5 95.8  PLT 361 325 388  --  318 846   Basic Metabolic Panel: Recent Labs  Lab 07/20/17 0743 07/22/17 0745 07/23/17 0542 07/24/17 0725 07/24/17 1212 07/25/17 0305  07/26/17 0718  NA 131* 135 131* 129* 132* 130* 130*  K 6.3* 5.0 4.8 4.8 4.3 4.4 4.5  CL 95* 101 94* 93*  --  92* 92*  CO2 21* 22 24 22   --  25 23  GLUCOSE 151* 176* 133* 141* 155* 176* 243*  BUN 55* 55* 35* 47*  --  40* 58*  CREATININE 10.37* 10.24* 7.72* 9.35*  --  8.22* 10.92*  CALCIUM 7.1* 7.6* 7.5* 7.8*  --  7.9* 8.0*  PHOS 6.3* 5.6*  --  6.3*  --   --   --    GFR: Estimated Creatinine Clearance: 9 mL/min (A) (by C-G formula based on SCr of 10.92 mg/dL (H)). Liver Function Tests: Recent Labs  Lab 07/20/17 0743 07/22/17 0745 07/24/17 0725  ALBUMIN 3.1* 3.2* 3.4*   No results for input(s): LIPASE, AMYLASE in the last 168 hours. No results for input(s): AMMONIA in the last 168 hours. Coagulation Profile: Recent Labs  Lab 07/23/17 0542 07/23/17 1539 07/24/17 0657 07/25/17 0305 07/26/17 0718  INR 2.36 1.67 1.21 1.17 1.04   Cardiac Enzymes: No results for input(s): CKTOTAL, CKMB, CKMBINDEX, TROPONINI in the last 168 hours. BNP (last 3 results) No results for input(s): PROBNP in the last 8760 hours. HbA1C: No results for input(s): HGBA1C in the last 72 hours. CBG: Recent Labs  Lab 07/25/17 1150 07/25/17 1729 07/25/17 2109 07/26/17 0816 07/26/17 1138  GLUCAP 307* 228* 325* 243* 252*   Lipid Profile: No results for input(s): CHOL, HDL, LDLCALC, TRIG, CHOLHDL, LDLDIRECT in the last 72 hours. Thyroid Function Tests: No results for input(s): TSH, T4TOTAL, FREET4, T3FREE, THYROIDAB in the last 72 hours. Anemia Panel: No results for input(s): VITAMINB12, FOLATE, FERRITIN, TIBC, IRON, RETICCTPCT in the last 72 hours. Sepsis Labs: No results for input(s): PROCALCITON, LATICACIDVEN in the last 168 hours.  No results found for this or any previous visit (from the past 240 hour(s)).       Radiology Studies: No results found.    Scheduled Meds: . darbepoetin (ARANESP) injection - DIALYSIS  60 mcg Intravenous Q Mon-HD  . [START ON 07/27/2017] doxercalciferol  1  mcg Intravenous Q T,Th,Sa-HD  . insulin aspart  0-5 Units Subcutaneous QHS  . insulin aspart  0-9 Units Subcutaneous TID WC  . lanthanum  1,000 mg Oral TID WC  . multivitamin  1 tablet Oral QHS  . pantoprazole  40 mg Oral Daily  . polyethylene glycol  17 g Oral Daily  . warfarin  7.5 mg Oral ONCE-1800  . Warfarin - Pharmacist Dosing Inpatient   Does not apply q1800   Continuous Infusions: . heparin 1,350 Units/hr (07/26/17 1117)     LOS: 11 days    Verneita Griffes, MD [facility] [phone]

## 2017-07-27 ENCOUNTER — Inpatient Hospital Stay (HOSPITAL_COMMUNITY): Payer: Medicare Other

## 2017-07-27 ENCOUNTER — Inpatient Hospital Stay (HOSPITAL_COMMUNITY): Payer: Medicare Other | Admitting: Certified Registered"

## 2017-07-27 ENCOUNTER — Encounter (HOSPITAL_COMMUNITY)
Admission: EM | Disposition: A | Discharge status: Home / Self Care | Payer: Self-pay | Source: Home / Self Care | Attending: Family Medicine

## 2017-07-27 DIAGNOSIS — T82868A Thrombosis of vascular prosthetic devices, implants and grafts, initial encounter: Secondary | ICD-10-CM

## 2017-07-27 DIAGNOSIS — Z992 Dependence on renal dialysis: Secondary | ICD-10-CM

## 2017-07-27 DIAGNOSIS — N186 End stage renal disease: Secondary | ICD-10-CM

## 2017-07-27 HISTORY — PX: INSERTION OF DIALYSIS CATHETER: SHX1324

## 2017-07-27 LAB — CBC WITH DIFFERENTIAL/PLATELET
BASOS PCT: 1 %
Basophils Absolute: 0.1 10*3/uL (ref 0.0–0.1)
EOS ABS: 0.4 10*3/uL (ref 0.0–0.7)
Eosinophils Relative: 3 %
HCT: 26.8 % — ABNORMAL LOW (ref 39.0–52.0)
HEMOGLOBIN: 9.3 g/dL — AB (ref 13.0–17.0)
Lymphocytes Relative: 28 %
Lymphs Abs: 3.7 10*3/uL (ref 0.7–4.0)
MCH: 33.7 pg (ref 26.0–34.0)
MCHC: 34.7 g/dL (ref 30.0–36.0)
MCV: 97.1 fL (ref 78.0–100.0)
MONO ABS: 0.9 10*3/uL (ref 0.1–1.0)
MONOS PCT: 7 %
NEUTROS PCT: 61 %
Neutro Abs: 8.1 10*3/uL — ABNORMAL HIGH (ref 1.7–7.7)
Platelets: 402 10*3/uL — ABNORMAL HIGH (ref 150–400)
RBC: 2.76 MIL/uL — ABNORMAL LOW (ref 4.22–5.81)
RDW: 14.8 % (ref 11.5–15.5)
WBC: 13.2 10*3/uL — ABNORMAL HIGH (ref 4.0–10.5)

## 2017-07-27 LAB — RENAL FUNCTION PANEL
ALBUMIN: 3.2 g/dL — AB (ref 3.5–5.0)
ANION GAP: 15 (ref 5–15)
BUN: 66 mg/dL — ABNORMAL HIGH (ref 6–20)
CALCIUM: 7.8 mg/dL — AB (ref 8.9–10.3)
CO2: 21 mmol/L — ABNORMAL LOW (ref 22–32)
Chloride: 95 mmol/L — ABNORMAL LOW (ref 101–111)
Creatinine, Ser: 11.69 mg/dL — ABNORMAL HIGH (ref 0.61–1.24)
GFR, EST AFRICAN AMERICAN: 5 mL/min — AB (ref >60)
GFR, EST NON AFRICAN AMERICAN: 4 mL/min — AB (ref >60)
Glucose, Bld: 168 mg/dL — ABNORMAL HIGH (ref 65–99)
PHOSPHORUS: 4.3 mg/dL (ref 2.5–4.6)
POTASSIUM: 4.7 mmol/L (ref 3.5–5.1)
SODIUM: 131 mmol/L — AB (ref 135–145)

## 2017-07-27 LAB — GLUCOSE, CAPILLARY
GLUCOSE-CAPILLARY: 161 mg/dL — AB (ref 65–99)
GLUCOSE-CAPILLARY: 187 mg/dL — AB (ref 65–99)
Glucose-Capillary: 166 mg/dL — ABNORMAL HIGH (ref 65–99)
Glucose-Capillary: 292 mg/dL — ABNORMAL HIGH (ref 65–99)

## 2017-07-27 LAB — PROTIME-INR
INR: 1.21
PROTHROMBIN TIME: 15.2 s (ref 11.4–15.2)

## 2017-07-27 SURGERY — INSERTION OF DIALYSIS CATHETER
Anesthesia: General | Laterality: Left

## 2017-07-27 MED ORDER — WARFARIN - PHARMACIST DOSING INPATIENT
Freq: Every day | Status: DC
  Administered 2017-07-29: 18:00:00

## 2017-07-27 MED ORDER — OXYCODONE-ACETAMINOPHEN 5-325 MG PO TABS
ORAL_TABLET | ORAL | Status: AC
  Administered 2017-07-27: 1 via ORAL
  Filled 2017-07-27: qty 1

## 2017-07-27 MED ORDER — ALTEPLASE 2 MG IJ SOLR: 2.0000 mg | Freq: Once | INTRAMUSCULAR | Status: DC | PRN

## 2017-07-27 MED ORDER — ONDANSETRON HCL 4 MG/2ML IJ SOLN
INTRAMUSCULAR | Status: DC | PRN
  Administered 2017-07-27: 4 mg via INTRAVENOUS

## 2017-07-27 MED ORDER — FENTANYL CITRATE (PF) 250 MCG/5ML IJ SOLN
INTRAMUSCULAR | Status: DC | PRN
  Administered 2017-07-27 (×2): 100 ug via INTRAVENOUS
  Administered 2017-07-27: 50 ug via INTRAVENOUS

## 2017-07-27 MED ORDER — HEPARIN SODIUM (PORCINE) 1000 UNIT/ML IJ SOLN
INTRAMUSCULAR | Status: AC
  Filled 2017-07-27: qty 1

## 2017-07-27 MED ORDER — FENTANYL CITRATE (PF) 100 MCG/2ML IJ SOLN
25.0000 ug | INTRAMUSCULAR | Status: DC | PRN
  Filled 2017-07-27: qty 2

## 2017-07-27 MED ORDER — LIDOCAINE-PRILOCAINE 2.5-2.5 % EX CREA: 1.0000 "application " | TOPICAL_CREAM | CUTANEOUS | Status: DC | PRN

## 2017-07-27 MED ORDER — SUCCINYLCHOLINE CHLORIDE 20 MG/ML IJ SOLN
INTRAMUSCULAR | Status: DC | PRN
  Administered 2017-07-27: 80 mg via INTRAVENOUS

## 2017-07-27 MED ORDER — CISATRACURIUM BESYLATE 20 MG/10ML IV SOLN
INTRAVENOUS | Status: AC
  Filled 2017-07-27: qty 10

## 2017-07-27 MED ORDER — SUCCINYLCHOLINE CHLORIDE 200 MG/10ML IV SOSY
PREFILLED_SYRINGE | INTRAVENOUS | Status: AC
  Filled 2017-07-27: qty 10

## 2017-07-27 MED ORDER — HEPARIN (PORCINE) IN NACL 100-0.45 UNIT/ML-% IJ SOLN
1200.0000 [IU]/h | INTRAMUSCULAR | Status: DC
  Administered 2017-07-27: 1400 [IU]/h via INTRAVENOUS
  Administered 2017-07-28: 1200 [IU]/h via INTRAVENOUS
  Filled 2017-07-27 (×2): qty 250

## 2017-07-27 MED ORDER — PROPOFOL 10 MG/ML IV BOLUS
INTRAVENOUS | Status: AC
  Filled 2017-07-27: qty 20

## 2017-07-27 MED ORDER — 0.9 % SODIUM CHLORIDE (POUR BTL) OPTIME
TOPICAL | Status: DC | PRN
  Administered 2017-07-27: 1000 mL

## 2017-07-27 MED ORDER — ONDANSETRON HCL 4 MG/2ML IJ SOLN: 4.0000 mg | Freq: Once | INTRAMUSCULAR | Status: DC | PRN

## 2017-07-27 MED ORDER — SODIUM CHLORIDE 0.9 % IV SOLN
INTRAVENOUS | Status: DC | PRN
  Administered 2017-07-27: 10:00:00

## 2017-07-27 MED ORDER — LIDOCAINE HCL (PF) 1 % IJ SOLN: 5.0000 mL | INTRAMUSCULAR | Status: DC | PRN

## 2017-07-27 MED ORDER — MIDAZOLAM HCL 2 MG/2ML IJ SOLN
INTRAMUSCULAR | Status: AC
  Filled 2017-07-27: qty 2

## 2017-07-27 MED ORDER — OXYCODONE-ACETAMINOPHEN 5-325 MG PO TABS
1.0000 | ORAL_TABLET | ORAL | Status: DC | PRN
  Administered 2017-07-27 – 2017-07-30 (×9): 1 via ORAL
  Filled 2017-07-27 (×7): qty 1

## 2017-07-27 MED ORDER — ESMOLOL HCL 100 MG/10ML IV SOLN
INTRAVENOUS | Status: DC | PRN
  Administered 2017-07-27: 20 mg via INTRAVENOUS
  Administered 2017-07-27: 10 mg via INTRAVENOUS

## 2017-07-27 MED ORDER — PHENYLEPHRINE HCL 10 MG/ML IJ SOLN
INTRAMUSCULAR | Status: DC | PRN
  Administered 2017-07-27: 160 ug via INTRAVENOUS
  Administered 2017-07-27: 240 ug via INTRAVENOUS

## 2017-07-27 MED ORDER — OXYCODONE-ACETAMINOPHEN 5-325 MG PO TABS
ORAL_TABLET | ORAL | Status: AC
  Filled 2017-07-27: qty 1

## 2017-07-27 MED ORDER — HEPARIN SODIUM (PORCINE) 1000 UNIT/ML DIALYSIS: 1000.0000 [IU] | INTRAMUSCULAR | Status: DC | PRN

## 2017-07-27 MED ORDER — PENTAFLUOROPROP-TETRAFLUOROETH EX AERO: 1.0000 "application " | INHALATION_SPRAY | CUTANEOUS | Status: DC | PRN

## 2017-07-27 MED ORDER — SODIUM CHLORIDE 0.9 % IV SOLN: 100.0000 mL | INTRAVENOUS | Status: DC | PRN

## 2017-07-27 MED ORDER — LIDOCAINE-EPINEPHRINE (PF) 1 %-1:200000 IJ SOLN
INTRAMUSCULAR | Status: AC
  Filled 2017-07-27: qty 30

## 2017-07-27 MED ORDER — HEPARIN SODIUM (PORCINE) 1000 UNIT/ML IJ SOLN
INTRAMUSCULAR | Status: DC | PRN
  Administered 2017-07-27: 1000 [IU]

## 2017-07-27 MED ORDER — LIDOCAINE HCL (CARDIAC) 20 MG/ML IV SOLN
INTRAVENOUS | Status: DC | PRN
  Administered 2017-07-27: 60 mg via INTRATRACHEAL

## 2017-07-27 MED ORDER — EPHEDRINE SULFATE 50 MG/ML IJ SOLN
INTRAMUSCULAR | Status: DC | PRN
  Administered 2017-07-27: 15 mg via INTRAVENOUS
  Administered 2017-07-27: 10 mg via INTRAVENOUS
  Administered 2017-07-27: 20 mg via INTRAVENOUS

## 2017-07-27 MED ORDER — CEFAZOLIN SODIUM-DEXTROSE 2-4 GM/100ML-% IV SOLN
INTRAVENOUS | Status: AC
  Filled 2017-07-27: qty 100

## 2017-07-27 MED ORDER — FENTANYL CITRATE (PF) 250 MCG/5ML IJ SOLN
INTRAMUSCULAR | Status: AC
  Filled 2017-07-27: qty 5

## 2017-07-27 MED ORDER — MIDAZOLAM HCL 2 MG/2ML IJ SOLN
INTRAMUSCULAR | Status: DC | PRN
  Administered 2017-07-27: 2 mg via INTRAVENOUS

## 2017-07-27 MED ORDER — ROCURONIUM BROMIDE 10 MG/ML (PF) SYRINGE
PREFILLED_SYRINGE | INTRAVENOUS | Status: AC
  Filled 2017-07-27: qty 5

## 2017-07-27 MED ORDER — MENTHOL 3 MG MT LOZG
1.0000 | LOZENGE | OROMUCOSAL | Status: DC | PRN
  Administered 2017-07-29: 3 mg via ORAL
  Filled 2017-07-27 (×2): qty 9

## 2017-07-27 MED ORDER — PROPOFOL 10 MG/ML IV BOLUS
INTRAVENOUS | Status: DC | PRN
  Administered 2017-07-27: 50 mg via INTRAVENOUS
  Administered 2017-07-27: 150 mg via INTRAVENOUS

## 2017-07-27 MED ORDER — LIDOCAINE 2% (20 MG/ML) 5 ML SYRINGE
INTRAMUSCULAR | Status: AC
  Filled 2017-07-27: qty 5

## 2017-07-27 MED ORDER — SODIUM CHLORIDE 0.9 % IV SOLN
INTRAVENOUS | Status: DC
  Administered 2017-07-27: 10:00:00 via INTRAVENOUS

## 2017-07-27 MED ORDER — SODIUM CHLORIDE 0.9 % IV SOLN
100.0000 mL | INTRAVENOUS | Status: DC | PRN
  Administered 2017-07-27: 10:00:00 via INTRAVENOUS

## 2017-07-27 MED ORDER — CEFAZOLIN SODIUM-DEXTROSE 1-4 GM/50ML-% IV SOLN
1.0000 g | INTRAVENOUS | Status: AC
  Administered 2017-07-27: 1 g via INTRAVENOUS
  Filled 2017-07-27: qty 50

## 2017-07-27 SURGICAL SUPPLY — 39 items
BAG DECANTER FOR FLEXI CONT (MISCELLANEOUS) ×3 IMPLANT
BIOPATCH RED 1 DISK 7.0 (GAUZE/BANDAGES/DRESSINGS) ×2 IMPLANT
BIOPATCH RED 1IN DISK 7.0MM (GAUZE/BANDAGES/DRESSINGS) ×1
CATH PALINDROME RT-P 15FX23CM (CATHETERS) ×3 IMPLANT
CATH PALINDROME RT-P 15FX28CM (CATHETERS) ×3 IMPLANT
CHLORAPREP W/TINT 26ML (MISCELLANEOUS) ×3 IMPLANT
COVER PROBE W GEL 5X96 (DRAPES) ×3 IMPLANT
COVER SURGICAL LIGHT HANDLE (MISCELLANEOUS) ×3 IMPLANT
DERMABOND ADVANCED (GAUZE/BANDAGES/DRESSINGS) ×2
DERMABOND ADVANCED .7 DNX12 (GAUZE/BANDAGES/DRESSINGS) ×1 IMPLANT
DRAPE C-ARM 42X72 X-RAY (DRAPES) ×3 IMPLANT
DRAPE CHEST BREAST 15X10 FENES (DRAPES) ×3 IMPLANT
GAUZE SPONGE 4X4 16PLY XRAY LF (GAUZE/BANDAGES/DRESSINGS) ×3 IMPLANT
GLOVE BIO SURGEON STRL SZ7.5 (GLOVE) ×3 IMPLANT
GLOVE BIOGEL PI IND STRL 7.0 (GLOVE) ×1 IMPLANT
GLOVE BIOGEL PI IND STRL 7.5 (GLOVE) ×2 IMPLANT
GLOVE BIOGEL PI IND STRL 8 (GLOVE) ×1 IMPLANT
GLOVE BIOGEL PI INDICATOR 7.0 (GLOVE) ×2
GLOVE BIOGEL PI INDICATOR 7.5 (GLOVE) ×4
GLOVE BIOGEL PI INDICATOR 8 (GLOVE) ×2
GLOVE SURG SS PI 7.5 STRL IVOR (GLOVE) ×3 IMPLANT
GOWN STRL REUS W/ TWL LRG LVL3 (GOWN DISPOSABLE) ×3 IMPLANT
GOWN STRL REUS W/TWL LRG LVL3 (GOWN DISPOSABLE) ×6
KIT BASIN OR (CUSTOM PROCEDURE TRAY) ×3 IMPLANT
KIT ROOM TURNOVER OR (KITS) ×3 IMPLANT
NEEDLE 18GX1X1/2 (RX/OR ONLY) (NEEDLE) ×3 IMPLANT
NEEDLE HYPO 25GX1X1/2 BEV (NEEDLE) ×3 IMPLANT
NS IRRIG 1000ML POUR BTL (IV SOLUTION) ×3 IMPLANT
PACK SURGICAL SETUP 50X90 (CUSTOM PROCEDURE TRAY) ×3 IMPLANT
PAD ARMBOARD 7.5X6 YLW CONV (MISCELLANEOUS) ×6 IMPLANT
SUT ETHILON 3 0 PS 1 (SUTURE) ×3 IMPLANT
SUT VICRYL 4-0 PS2 18IN ABS (SUTURE) ×3 IMPLANT
SYR 10ML LL (SYRINGE) ×3 IMPLANT
SYR 20CC LL (SYRINGE) ×6 IMPLANT
SYR 5ML LL (SYRINGE) ×6 IMPLANT
SYR CONTROL 10ML LL (SYRINGE) ×3 IMPLANT
TOWEL GREEN STERILE (TOWEL DISPOSABLE) ×3 IMPLANT
TOWEL GREEN STERILE FF (TOWEL DISPOSABLE) ×3 IMPLANT
WATER STERILE IRR 1000ML POUR (IV SOLUTION) ×3 IMPLANT

## 2017-07-27 NOTE — Anesthesia Procedure Notes (Signed)
Procedure Name: Intubation Date/Time: 07/27/2017 10:09 AM Performed by: Lance Coon, CRNA Pre-anesthesia Checklist: Patient identified, Emergency Drugs available, Suction available, Patient being monitored and Timeout performed Patient Re-evaluated:Patient Re-evaluated prior to induction Oxygen Delivery Method: Circle system utilized Preoxygenation: Pre-oxygenation with 100% oxygen Induction Type: IV induction Ventilation: Mask ventilation without difficulty Laryngoscope Size: Miller and 3 Grade View: Grade II Tube type: Oral Tube size: 7.5 mm Number of attempts: 2 Airway Equipment and Method: Bougie stylet Placement Confirmation: ETT inserted through vocal cords under direct vision,  positive ETCO2 and breath sounds checked- equal and bilateral Secured at: 23 cm Tube secured with: Tape Dental Injury: Teeth and Oropharynx as per pre-operative assessment  Comments: First attempt grade 3 view, unable to pass tube through VC. Pt repositioned, grade 3 view on second DL, bougie passed successfully, tube passed through VC with resistence.

## 2017-07-27 NOTE — Progress Notes (Signed)
PROGRESS NOTE    [name]  OYD:[id number] DOB: 11/06/1960 DOA: 07/15/2017 PCP: Arnoldo Morale, MD  Brief Narrative:Narrative  55 year old [place] S/p atherectomyright upper extremity fistula with successful use of the same Diastolic heart failure last EF 04/04/2017 normal RV function EF 55%?  Underwent Lexiscan 10/12/2016 EF 41% no ST segment deviation and it was a low risk study mass right atrium possible dialysis hip with adherent thrombus Cocaine HTN Metabolic bone disease Previous homelessness Elevated troponin without CAD-probably secondary to strain pattern from recent PE Diabetes mellitus type TY 2 Prior Acute PE Right fem DVT   Nephrology consulted for dialysis needs. Resume Coumadin with IV heparin bridging. DC home when INR therapeuticABOVE 2.   Assessment & Plan:   Active Problems:   Acute GI bleeding   Hyperkalemia  Acute lower GI bleeding 2/2 hemorrhoids vs semi-pedunculated polyp on colonoscopy 11.21 -In the setting of elevated INR 4.13 on presentation -INR 1.21-->1.04 today 11/223 -heparin for now--needing revision of graft  Non-working fistula RUE Appreciate Vasc Dr. Krystal Clark input [facility] An placed L chest 11/24 Will need fistulogram 11/26  Nausea Unclear casue Adding zofran 4 tid Seem overall improved  Subtherapeutic INR -coumadin on hold as needing varied surgeries--will resume soon   ESRD on HD TTH SAT -keep dialysis appointments -HD next will be on Saturday -needs access issues sorted out as above--VVs on board  Hyponatremia, possibly dilutional -improving -euvolemic -na+ 132 from 129 -BMP am  HTN- -losartan  Type 2 DM  -glipizide -BMP am -Sugars 160 range - glipizide has been held from prior to admission -adding levemir 5 u  Anemia of chronic disease -Additional component 2/2 ESRD -no futher bleed -hg 11.2-->9.5 -CBC am -Continue Aranesp 60 Q Monday    DVT prophylaxis  start heparin drip and overlap with Coumadin Code Status:full Family Communication: none Disposition Plan: await revision Consultants: GI  Procedures:colonoscopy today 07/24/17  Antimicrobials: none   Fair no new issue-seen at PACU and just had procedure done a little groggy Asking to eat   Objective: Vitals:   07/27/17 0854 07/27/17 1103 07/27/17 1115 07/27/17 1118  BP: (!) 149/84 (!) 146/79  125/83  Pulse: 88 93 96 99  Resp: 17 12 20 15   Temp: 98.1 F (36.7 C) (!) 97.4 F (36.3 C)    TempSrc: Oral     SpO2: 100% 99% 97% 95%  Weight:      Height:        Intake/Output Summary (Last 24 hours) at 07/27/2017 1155 Last data filed at 07/27/2017 1047 Gross per 24 hour  Intake 780 ml  Output 280 ml  Net 500 ml   Filed Weights   07/24/17 1232 07/24/17 2100 07/26/17 2030  Weight: 98 kg (216 lb 0.8 oz) 97 kg (213 lb 13.5 oz) 97.7 kg (215 lb 6.2 oz)    Examination:  General exam: 57 yo Male WD WN NAD A&O x3 Respiratory system: Clear to auscultation.   Cardiovascular system: S1 & S2 heard, RRR. No JVD Gastrointestinal system: Abdomen is nondistended, soft and nontender.  Central nervous system: Alert and oriented. No focal neurological deficits. Extremities: Symmetric 5 x 5 power. Skin: No rashes, lesions or open ulcers Psychiatry: Judgement and insight appear normal. Mood & affect appropriate.     Data Reviewed: I have personally reviewed following labs and imaging studies  CBC: Recent Labs  Lab 07/23/17 0542 07/24/17 0524 07/24/17 0657 07/24/17 1212 07/25/17 0305 07/26/17 0718 07/27/17 0410  WBC 11.2* 11.6* 12.4*  --  11.0* 11.3* 13.2*  NEUTROABS 7.5  --   --   --   --  7.0 8.1*  HGB 10.8* 10.7* 10.9* 11.2* 10.3* 9.5* 9.3*  HCT 31.7* 30.4* 30.7* 33.0* 30.8* 27.3* 26.8*  MCV 97.2 95.3 95.6  --  97.5 95.8 97.1  PLT 361 325 388  --  318 333 102*   Basic Metabolic Panel: Recent Labs  Lab 07/22/17 0745 07/23/17 0542 07/24/17 0725 07/24/17 1212  07/25/17 0305 07/26/17 0718 07/27/17 0845  NA 135 131* 129* 132* 130* 130* 131*  K 5.0 4.8 4.8 4.3 4.4 4.5 4.7  CL 101 94* 93*  --  92* 92* 95*  CO2 22 24 22   --  25 23 21*  GLUCOSE 176* 133* 141* 155* 176* 243* 168*  BUN 55* 35* 47*  --  40* 58* 66*  CREATININE 10.24* 7.72* 9.35*  --  8.22* 10.92* 11.69*  CALCIUM 7.6* 7.5* 7.8*  --  7.9* 8.0* 7.8*  PHOS 5.6*  --  6.3*  --   --   --  4.3   GFR: Estimated Creatinine Clearance: 8.4 mL/min (A) (by C-G formula based on SCr of 11.69 mg/dL (H)). Liver Function Tests: Recent Labs  Lab 07/22/17 0745 07/24/17 0725 07/27/17 0845  ALBUMIN 3.2* 3.4* 3.2*   No results for input(s): LIPASE, AMYLASE in the last 168 hours. No results for input(s): AMMONIA in the last 168 hours. Coagulation Profile: Recent Labs  Lab 07/23/17 1539 07/24/17 0657 07/25/17 0305 07/26/17 0718 07/27/17 0410  INR 1.67 1.21 1.17 1.04 1.21   Cardiac Enzymes: No results for input(s): CKTOTAL, CKMB, CKMBINDEX, TROPONINI in the last 168 hours. BNP (last 3 results) No results for input(s): PROBNP in the last 8760 hours. HbA1C: No results for input(s): HGBA1C in the last 72 hours. CBG: Recent Labs  Lab 07/26/17 1138 07/26/17 1637 07/26/17 2037 07/27/17 0738 07/27/17 1105  GLUCAP 252* 260* 295* 166* 161*   Lipid Profile: No results for input(s): CHOL, HDL, LDLCALC, TRIG, CHOLHDL, LDLDIRECT in the last 72 hours. Thyroid Function Tests: No results for input(s): TSH, T4TOTAL, FREET4, T3FREE, THYROIDAB in the last 72 hours. Anemia Panel: No results for input(s): VITAMINB12, FOLATE, FERRITIN, TIBC, IRON, RETICCTPCT in the last 72 hours. Sepsis Labs: No results for input(s): PROCALCITON, LATICACIDVEN in the last 168 hours.  No results found for this or any previous visit (from the past 240 hour(s)).       Radiology Studies: Dg Chest Port 1 View  Result Date: 07/27/2017 CLINICAL DATA:  Dialysis catheter insertion. EXAM: PORTABLE CHEST 1 VIEW  COMPARISON:  04/02/2017 FINDINGS: Patient has a left jugular dialysis catheter. Catheter tip in the lower SVC. Low lung volumes. Few patchy densities along the left costophrenic angle. Negative for a pneumothorax. Heart size is enlarged but accentuated by the low lung volumes. IMPRESSION: Dialysis catheter tip in the SVC.  Negative for pneumothorax. Low lung volumes with few densities near the left costophrenic angle. Findings could represent atelectasis or small effusion. Electronically Signed   By: Markus Daft M.D.   On: 07/27/2017 11:34   Dg Fluoro Guide Cv Line-no Report  Result Date: 07/27/2017 Fluoroscopy was utilized by the requesting physician.  No radiographic interpretation.      Scheduled Meds: . [MAR Hold] darbepoetin (ARANESP) injection - DIALYSIS  60 mcg Intravenous Q Mon-HD  . [MAR Hold] doxercalciferol  1 mcg Intravenous Q T,Th,Sa-HD  . [MAR Hold] insulin aspart  0-5 Units Subcutaneous QHS  . [MAR Hold] insulin aspart  0-9  Units Subcutaneous TID WC  . [MAR Hold] insulin detemir  5 Units Subcutaneous Daily  . [MAR Hold] lanthanum  1,000 mg Oral TID WC  . [MAR Hold] multivitamin  1 tablet Oral QHS  . [MAR Hold] pantoprazole  40 mg Oral Daily  . [MAR Hold] polyethylene glycol  17 g Oral Daily  . [MAR Hold] Warfarin - Pharmacist Dosing Inpatient   Does not apply q1800   Continuous Infusions: . [MAR Hold] sodium chloride Stopped (07/27/17 1047)  . [MAR Hold] sodium chloride    . sodium chloride 10 mL/hr at 07/27/17 0933     LOS: 12 days    Verneita Griffes, MD [facility] [phone]

## 2017-07-27 NOTE — Anesthesia Preprocedure Evaluation (Addendum)
Anesthesia Evaluation  Patient identified by MRN, date of birth, ID band Patient awake    Reviewed: Allergy & Precautions, NPO status , Patient's Chart, lab work & pertinent test results  Airway Mallampati: III  TM Distance: >3 FB Neck ROM: Full    Dental  (+) Teeth Intact   Pulmonary neg pulmonary ROS,    Pulmonary exam normal breath sounds clear to auscultation       Cardiovascular hypertension, Pt. on medications + Peripheral Vascular Disease and +CHF  Normal cardiovascular exam Rhythm:Regular Rate:Normal  ECG: NSR, rate 87  Echo 04/03/17: Normal LV size with mild LV hypertrophy. EF 55% with basal inferior hypokinesis. Moderate mitral regurgitation. Normal RV size and systolic function. Mass noted in right atrium, likely dialysis catheter tip with adherent thrombus versus vegetation. Could consider further evaluation with TEE.   Neuro/Psych negative neurological ROS     GI/Hepatic GERD  Medicated,(+)     substance abuse  cocaine use, Hematochezia    Endo/Other  diabetes, Type 2, Oral Hypoglycemic Agents  Renal/GU Dialysis and ESRFRenal disease     Musculoskeletal  (+) Arthritis ,   Abdominal   Peds  Hematology  (+) Blood dyscrasia (Coumadin), anemia ,   Anesthesia Other Findings Day of surgery medications reviewed with the patient.  Reproductive/Obstetrics                            Anesthesia Physical  Anesthesia Plan  ASA: IV  Anesthesia Plan: General   Post-op Pain Management:    Induction: Intravenous  PONV Risk Score and Plan: 2 and Ondansetron and Propofol infusion  Airway Management Planned: Oral ETT  Additional Equipment:   Intra-op Plan:   Post-operative Plan: Extubation in OR  Informed Consent: I have reviewed the patients History and Physical, chart, labs and discussed the procedure including the risks, benefits and alternatives for the proposed anesthesia  with the patient or authorized representative who has indicated his/her understanding and acceptance.   Dental advisory given  Plan Discussed with: CRNA  Anesthesia Plan Comments:         Anesthesia Quick Evaluation

## 2017-07-27 NOTE — Procedures (Signed)
I have personally attended this patient's dialysis session.   TDC placed by VVS  (eval of AVF early week) UF goal to EDW 93 Pre weight 97.7 2K bath (K 4.7) No heparin (on drip at present)  Jamal Maes, MD [facility] [phone] Pager 07/27/2017, 3:07 PM

## 2017-07-27 NOTE — Transfer of Care (Signed)
Immediate Anesthesia Transfer of Care Note  Patient: [name]  Procedure(s) Performed: INSERTION OF TUNNELED DIALYSIS CATHETER - Left Internal Jugular Placement (Left )  Patient Location: PACU  Anesthesia Type:General  Level of Consciousness: awake and patient cooperative  Airway & Oxygen Therapy: Patient Spontanous Breathing  Post-op Assessment: Report given to RN and Post -op Vital signs reviewed and stable  Post vital signs: Reviewed and stable  Last Vitals:  Vitals:   07/27/17 0510 07/27/17 0854  BP: (!) 155/83 (!) 149/84  Pulse: (!) 106 88  Resp: 16 17  Temp: 37 C 36.7 C  SpO2: 100% 100%    Last Pain:  Vitals:   07/27/17 0854  TempSrc: Oral  PainSc:          Complications: No apparent anesthesia complications

## 2017-07-27 NOTE — Progress Notes (Signed)
Heparin stopped at this time for pending procedure.

## 2017-07-27 NOTE — Anesthesia Postprocedure Evaluation (Signed)
Anesthesia Post Note  Patient: [name]  Procedure(s) Performed: INSERTION OF TUNNELED DIALYSIS CATHETER - Left Internal Jugular Placement (Left )     Patient location during evaluation: PACU Anesthesia Type: General Level of consciousness: awake and alert Pain management: pain level controlled Vital Signs Assessment: post-procedure vital signs reviewed and stable Respiratory status: spontaneous breathing, nonlabored ventilation, respiratory function stable and patient connected to nasal cannula oxygen Cardiovascular status: blood pressure returned to baseline and stable Postop Assessment: no apparent nausea or vomiting Anesthetic complications: no    Last Vitals:  Vitals:   07/27/17 1700 07/27/17 1848  BP: 93/70 105/73  Pulse: 98 (!) 103  Resp:  18  Temp:  (!) 36.3 C  SpO2:  98%    Last Pain:  Vitals:   07/27/17 1848  TempSrc: Oral  PainSc:                  Regenia Erck P Raylon Lamson

## 2017-07-27 NOTE — Progress Notes (Signed)
ANTICOAGULATION CONSULT NOTE - Follow Up Consult  Pharmacy Consult for Heparin Indication: PE / DVT history  Allergies  Allergen Reactions  . No Known Allergies     Patient Measurements: Height: 5\' 11"  (180.3 cm) Weight: 215 lb 6.2 oz (97.7 kg) IBW/kg (Calculated) : 75.3 Heparin Dosing Weight: 94 kg  Vital Signs: Temp: 97.4 F (36.3 C) (11/24 1230) Temp Source: Oral (11/24 0854) BP: 120/76 (11/24 1233) Pulse Rate: 88 (11/24 1233)  Labs: Recent Labs    07/25/17 0305 07/25/17 2237 07/26/17 0718 07/27/17 0410 07/27/17 0845  HGB 10.3*  --  9.5* 9.3*  --   HCT 30.8*  --  27.3* 26.8*  --   PLT 318  --  333 402*  --   LABPROT 14.8  --  13.5 15.2  --   INR 1.17  --  1.04 1.21  --   HEPARINUNFRC  --  0.14* 0.38  --   --   CREATININE 8.22*  --  10.92*  --  11.69*    Estimated Creatinine Clearance: 8.4 mL/min (A) (by C-G formula based on SCr of 11.69 mg/dL (H)).  Assessment: 56 year old male to continue heparin at 16:30 pm for DVT/PE history, warfarin on hold until after procedure Monday  Goal of Therapy:  Heparin level = 0.3 to 0.7 Monitor platelets   Plan:  Heparin at 1400 units / hr at 16:30 pm 8 hour heparin level Daily heparin level, CBC  Thank you Anette Guarneri, PharmD [phone]   07/27/2017,1:26 PM

## 2017-07-27 NOTE — Progress Notes (Signed)
[name]   Subjective:  Brought up from HD this morning, but unable to be cannulated. Pulling dark clots from AVF, entire AVF is pulsatile. VVS consulted, plan is for [facility] placement this morning and then will plan for fistulogram to try to salvage AVF on Monday. Per VVS Mckenzie, may actually need new access.  Objective Vitals:   07/26/17 0840 07/26/17 2030 07/27/17 0510 07/27/17 0854  BP: 113/72 (!) 142/81 (!) 155/83 (!) 149/84  Pulse: 92 93 (!) 106 88  Resp: 18 16 16 17   Temp: 98.4 F (36.9 C) 98.7 F (37.1 C) 98.6 F (37 C) 98.1 F (36.7 C)  TempSrc: Oral Oral Oral Oral  SpO2: 98% 100% 100% 100%  Weight:  97.7 kg (215 lb 6.2 oz)    Height:       Physical Exam General: Well appearing, NAD. Heart: RRR; no murmur Lungs: CTAB Abdomen: soft, non-tender Extremities: No LE edema. Dialysis Access: R AVF with two aneurysmal areas, patent but with strong pulsatility up entire R arm  Additional Objective Labs: Basic Metabolic Panel: Recent Labs  Lab 07/22/17 0745  07/24/17 0725  07/25/17 0305 07/26/17 0718 07/27/17 0845  NA 135   < > 129*   < > 130* 130* 131*  K 5.0   < > 4.8   < > 4.4 4.5 4.7  CL 101   < > 93*  --  92* 92* 95*  CO2 22   < > 22  --  25 23 21*  GLUCOSE 176*   < > 141*   < > 176* 243* 168*  BUN 55*   < > 47*  --  40* 58* 66*  CREATININE 10.24*   < > 9.35*  --  8.22* 10.92* 11.69*  CALCIUM 7.6*   < > 7.8*  --  7.9* 8.0* 7.8*  PHOS 5.6*  --  6.3*  --   --   --  4.3   < > = values in this interval not displayed.   Liver Function Tests: Recent Labs  Lab 07/22/17 0745 07/24/17 0725  ALBUMIN 3.2* 3.4*   CBC: Recent Labs  Lab 07/23/17 0542 07/24/17 0524 07/24/17 0657  07/25/17 0305 07/26/17 0718 07/27/17 0410  WBC 11.2* 11.6* 12.4*  --  11.0* 11.3* 13.2*  NEUTROABS 7.5  --   --   --   --  7.0 8.1*  HGB 10.8* 10.7* 10.9*   < > 10.3* 9.5* 9.3*  HCT 31.7* 30.4* 30.7*   < > 30.8* 27.3* 26.8*  MCV 97.2 95.3 95.6  --  97.5  95.8 97.1  PLT 361 325 388  --  318 333 402*   < > = values in this interval not displayed.   CBG: Recent Labs  Lab 07/26/17 0816 07/26/17 1138 07/26/17 1637 07/26/17 2037 07/27/17 0738  GLUCAP 243* 252* 260* 295* 166*   Medications: . sodium chloride    . sodium chloride     . darbepoetin (ARANESP) injection - DIALYSIS  60 mcg Intravenous Q Mon-HD  . doxercalciferol  1 mcg Intravenous Q T,Th,Sa-HD  . insulin aspart  0-5 Units Subcutaneous QHS  . insulin aspart  0-9 Units Subcutaneous TID WC  . insulin detemir  5 Units Subcutaneous Daily  . lanthanum  1,000 mg Oral TID WC  . multivitamin  1 tablet Oral QHS  . pantoprazole  40 mg Oral Daily  . polyethylene glycol  17 g Oral Daily  . Warfarin - Pharmacist Dosing Inpatient  Does not apply q1800    [facility]/800 92 kg 2.0 K/ 2.5 Ca  -Heparin 6000 units IV TIW -Hectorol 1 mcg IV TIW -No ESA/Venofer  BMD meds:  -Auryxia 210 mg 2 tabs TID A/C -Sensipar 60 mg PO q evening   Assessment/Plan: 1. Acute LowerGIB: Supra-therapeutic INR on admit requiringVit K. Was stable on conservative management but unfortunately rebled. Underwent colonoscopy 11/21 showing internal hemorrhoids and one polyp. Awaiting therapeutic INR for DC however tells me he has court date next Wednesday and is worried about getting out of the hospital on time. With access issues, he will now most certainly miss this. Spoke to hospitalist about having case manager assist with this. 2. HxDVT/PE: Coumadinheld, then resumed. INR 1.2 today. Now will need to be held again with plan for HD access surgery on Monday 11/26. 3. ESRD: Usual TTS schedule. Will try to dialyze this afternoon s/p [facility] placement. 4. Hypertension/volume: BP inching up.Was onLosartan 25 mg PO q day, amlodipine 5 mg PO dailybut now stopped.Not getting to OP EDW. Hyponatremia argues for volume overload, UF as tolerated. Events may have to do with  high BP ? 5. Anemia: Hgb 9.3. Continue Aranesp 80mcg weekly for now. Last dose 07/22/17.  6. Metabolic bone disease:  Continue Fosrenol and Hectoral, sensipar on hold d/t low Ca.  7. Nutrition: Alb low, Renal diet/fld restrictions. Renal vit/nepro.  8. DM:Per primary.BS remains high. Needs better control.  9. Dialysis Access dysfunction: See above. Near clotted AVF 11/24. VVS consulted. For [facility] today and fistulogram with intervention scheduled for 11/26.    Ronald Penton, PA-C 07/27/2017, 9:02 AM  [facility] Pager: [phone]  Patient seen and examined, agree with above Mckenzie with above modifications. Pt was stable awaiting therapeutic INR- now with AVF dysfunction.   Plan as above, PC today and fistulogram vs revision on Monday  Ronald Parish, MD 07/27/2017

## 2017-07-27 NOTE — Op Note (Signed)
    NAME: [name]    MRN: [id number] DOB: [birth year]    DATE OF OPERATION: 07/27/2017  PREOP DIAGNOSIS:    End-stage renal disease   POSTOP DIAGNOSIS:    Same  PROCEDURE:    Ultrasound-guided placement of left IJ 23 cm tunneled dialysis catheter  SURGEON: Judeth Cornfield. Scot Dock, MD, FACS  ASSIST: None  ANESTHESIA: General  EBL: Minimal  INDICATIONS:    [name] is a 56 y.o. male who has a nonfunctioning right upper arm fistula.  He presents for placement of a tunneled dialysis catheter.  FINDINGS:   Patent left IJ.  TECHNIQUE:   The patient was taken to the operating room and received a general anesthetic.  The neck and upper chest were prepped and draped in usual sterile fashion.  Under ultrasound guidance, after the skin was anesthetized, the left IJ was cannulated and a guidewire introduced into the superior vena cava under fluoroscopic control.  The tract over the wire was dilated and then a dilator and peel-away sheath were advanced over the wire and the wire and dilator removed.  The catheter was passed through the peel-away sheath and positioned in the right atrium.  The exit site for the catheter was selected and the catheter was brought through the tunnel cut to the appropriate length and the distal ports were attached.  I had a difficult time finding a good position for the catheter except when it was positioned down into the right atrium.  I elected to try shorter catheter.  The catheter was brought to the previous tunnel and then the wire passed through the catheter after it was divided and the peel-away sheath and dilator were advanced over the wire.  The dilator was then removed and then the 23 cm catheter advanced over the wire down into the superior vena cava.  The a separate tunnel was made and the catheter was brought through the tunnel and cut to the appropriate length and the distal ports were attached.  Both ports withdrew easily  with and flushed with heparinized saline filled with concentrated heparin.  The catheter was secured at its exit site with a 3-0 nylon suture.  The IJ cannulation site was closed with a 4-0 subcuticular stitch.  Dermabond was applied.  The patient tolerated the procedure well was transferred to the recovery room in stable condition.  All needle and sponge counts were correct.  Deitra Mayo, MD, FACS [facility]  DATE OF DICTATION:   07/27/2017

## 2017-07-27 NOTE — Progress Notes (Signed)
CXR completed

## 2017-07-27 NOTE — Progress Notes (Signed)
HD  Not ready for pt at this time

## 2017-07-27 NOTE — Progress Notes (Signed)
Clydene Laming, RN confirmed w/ Dr Scot Dock at [phone] that per [facility] request Heparin gtt to be restarted at 1630

## 2017-07-27 NOTE — Progress Notes (Signed)
Patient name: [name] MRN: [id number] DOB: 10/31/1960 Sex: male  REASON FOR CONSULT:    Clotted right upper arm fistula.  The consult is requested by Dr. Moshe Cipro.  HPI:   [name] is a pleasant 56 y.o. male, with a nonfunctioning right upper arm fistula.  He had a right brachiocephalic fistula placed on 10/07/2014.  He apparently has had a surgical revision in the upper arm and also tells me he has had previous thrombolysis as an outpatient.  The fistula did not work well 2 days ago and then when he went for dialysis today was not working at all.   He denies pain or paresthesias in the right arm.  The patient is on anticoagulation because of a history of DVT and PE.  The patient underwent colonoscopy on 07/24/2017 because of rectal bleeding.  This showed a polyp in the sigmoid colon which was removed.  There were no other significant findings.  Past Medical History:  Diagnosis Date  . Anemia   . Cellulitis and abscess of foot 08/24/2011  . Cocaine abuse ([place]) 2016  . Diastolic congestive heart failure ([place])   . ESRD on dialysis ([place])    "TTS; [address]" (10/29/2016)  . GERD (gastroesophageal reflux disease)    unsure  . HCVD (hypertensive cardiovascular disease) 09/2016   Echo shows moderate to severe LAE, grade 1 DD  . Hypertension 2015  . Left atrial mass 04/04/2017  . Pneumonia 10/2016  . Protein calorie malnutrition ([place])   . PVD (peripheral vascular disease) ([place])   . Type II diabetes mellitus ([place]) 2015    Family History  Adopted: Yes  Problem Relation Age of Onset  . Varicose Veins Sister     SOCIAL HISTORY: Social History   Socioeconomic History  . Marital status: Legally Separated    Spouse name: Not on file  . Number of children: Not on file  . Years of education: Not on file  . Highest education level: Not on file  Social Needs  . Financial resource strain: Not on file  . Food insecurity - worry: Not on file  .  Food insecurity - inability: Not on file  . Transportation needs - medical: Not on file  . Transportation needs - non-medical: Not on file  Occupational History  . Occupation: Part-tme at [employer]  . Smoking status: Never Smoker  . Smokeless tobacco: Never Used  Substance and Sexual Activity  . Alcohol use: Yes    Alcohol/week: 0.0 oz    Comment: 10/29/2016 "might have a couple drinks on major holidays"  . Drug use: No    Comment: 10/29/2016 "smoked marijuana as a teen; last cocaine was 2017  . Sexual activity: Yes  Other Topics Concern  . Not on file  Social History Narrative  . Not on file    Allergies  Allergen Reactions  . No Known Allergies     Current Facility-Administered Medications  Medication Dose Route Frequency Provider Last Rate Last Dose  . 0.9 %  sodium chloride infusion  100 mL Intravenous PRN Valentina Gu, NP      . 0.9 %  sodium chloride infusion  100 mL Intravenous PRN Valentina Gu, NP      . alteplase (CATHFLO ACTIVASE) injection 2 mg  2 mg Intracatheter Once PRN Valentina Gu, NP      . Darbepoetin Alfa (ARANESP) injection 60 mcg  60 mcg Intravenous Q Mon-HD [name], MD  60 mcg at 07/22/17 1051  . doxercalciferol (HECTOROL) injection 1 mcg  1 mcg Intravenous Q T,Th,Sa-HD Skeet Simmer, [facility]      . heparin ADULT infusion 100 units/mL (25000 units/291mL sodium chloride 0.45%)  1,350 Units/hr Intravenous Continuous Erenest Blank, RPH 13.5 mL/hr at 07/26/17 1117 1,350 Units/hr at 07/26/17 1117  . heparin injection 1,000 Units  1,000 Units Dialysis PRN Valentina Gu, NP      . insulin aspart (novoLOG) injection 0-5 Units  0-5 Units Subcutaneous QHS Modena Jansky, MD   3 Units at 07/26/17 2302  . insulin aspart (novoLOG) injection 0-9 Units  0-9 Units Subcutaneous TID WC Modena Jansky, MD   5 Units at 07/26/17 1719  . insulin detemir (LEVEMIR) injection 5 Units  5 Units Subcutaneous Daily Nita Sells, MD   5 Units at 07/26/17 1718  . lanthanum (FOSRENOL) chewable tablet 1,000 mg  1,000 mg Oral TID WC Nita Sells, MD   1,000 mg at 07/26/17 1717  . lidocaine (PF) (XYLOCAINE) 1 % injection 5 mL  5 mL Intradermal PRN Valentina Gu, NP      . lidocaine-prilocaine (EMLA) cream 1 application  1 application Topical PRN Valentina Gu, NP      . multivitamin (RENA-VIT) tablet 1 tablet  1 tablet Oral QHS Ernest Haber, PA-C   1 tablet at 07/26/17 2302  . ondansetron (ZOFRAN-ODT) disintegrating tablet 4 mg  4 mg Oral Q8H PRN Nita Sells, MD   4 mg at 07/26/17 1116  . pantoprazole (PROTONIX) EC tablet 40 mg  40 mg Oral Daily Nita Sells, MD   40 mg at 07/26/17 1029  . pentafluoroprop-tetrafluoroeth (GEBAUERS) aerosol 1 application  1 application Topical PRN Valentina Gu, NP      . polyethylene glycol (MIRALAX / GLYCOLAX) packet 17 g  17 g Oral Daily Nita Sells, MD   17 g at 07/25/17 0942  . senna (SENOKOT) tablet 17.2 mg  2 tablet Oral QHS PRN Nita Sells, MD   17.2 mg at 07/21/17 2248  . Warfarin - Pharmacist Dosing Inpatient   Does not apply q1800 Skeet Simmer, [facility]        REVIEW OF SYSTEMS:  [X]  denotes positive finding, [ ]  denotes negative finding Cardiac  Comments:  Chest pain or chest pressure:    Shortness of breath upon exertion:    Short of breath when lying flat:    Irregular heart rhythm:        Vascular    Pain in calf, thigh, or hip brought on by ambulation:    Pain in feet at night that wakes you up from your sleep:     Blood clot in your veins:    Leg swelling:         Pulmonary    Oxygen at home:    Productive cough:     Wheezing:         Neurologic    Sudden weakness in arms or legs:     Sudden numbness in arms or legs:     Sudden onset of difficulty speaking or slurred speech:    Temporary loss of vision in one eye:     Problems with dizziness:         Gastrointestinal    Blood in  stool:     Vomited blood:         Genitourinary    Burning when urinating:     Blood in urine:  Psychiatric    Major depression:         Hematologic    Bleeding problems:    Problems with blood clotting too easily:        Skin    Rashes or ulcers:        Constitutional    Fever or chills:     PHYSICAL EXAM:   Vitals:   07/26/17 0526 07/26/17 0840 07/26/17 2030 07/27/17 0510  BP: 119/80 113/72 (!) 142/81 (!) 155/83  Pulse: 96 92 93 (!) 106  Resp: 16 18 16 16   Temp: 98.3 F (36.8 C) 98.4 F (36.9 C) 98.7 F (37.1 C) 98.6 F (37 C)  TempSrc: Oral Oral Oral Oral  SpO2: 98% 98% 100% 100%  Weight:   215 lb 6.2 oz (97.7 kg)   Height:        GENERAL: The patient is a well-nourished male, in no acute distress. The vital signs are documented above. CARDIAC: There is a regular rate and rhythm.  VASCULAR: He has a diminished right radial pulse. The entire fistula in the right upper arm is pulsatile all the way up to the shoulder. There are 2 aneurysms.  The more peripherally located aneurysm is the larger of the 2.  He has a long incision over the upper portion of the fistula in the upper arm. PULMONARY: There is good air exchange bilaterally without wheezing or rales. ABDOMEN: Soft and non-tender with normal pitched bowel sounds.  MUSCULOSKELETAL: There are no major deformities or cyanosis. NEUROLOGIC: No focal weakness or paresthesias are detected. SKIN: There are no ulcers or rashes noted. PSYCHIATRIC: The patient has a normal affect.  DATA:    INR is 1.2  MEDICAL ISSUES:   NONFUNCTIONING RIGHT UPPER ARM AV FISTULA: His entire fistula is pulsatile all the way up to the shoulder.  I suspect that he has an outflow occlusion or stenosis.  At the fistula is somewhat degenerative with a large aneurysm in the proximal section of the fistula and then a second aneurysm in the central portion.  The fistula has been in for 2-1/2 years.  This may not be salvageable.  I do  not think that surgical thrombectomy is indicated until he can undergo a fistulogram to see if there is an outflow problem.  Interventional radiology is not able to perform fistulogram on the weekends and therefore I will place tunneled dialysis catheter and proceed with a fistulogram on Monday.  If the fistula could not be salvaged that he would have to be considered for new access.  Unfortunately, we will have to continue to hold his Coumadin for now until his access issues can be resolved.  I have discussed the indications for placement of the catheter this morning and also the potential complications and he is agreeable to proceed.  We will get him scheduled for a fistulogram on Monday and will make further recommendations pending these results.  Deitra Mayo [facility] [phone]

## 2017-07-27 NOTE — Progress Notes (Signed)
Floor RN picked up patient's cell phone and glasses

## 2017-07-28 ENCOUNTER — Inpatient Hospital Stay (HOSPITAL_COMMUNITY): Payer: Medicare Other

## 2017-07-28 ENCOUNTER — Encounter (HOSPITAL_COMMUNITY): Payer: Self-pay | Admitting: Vascular Surgery

## 2017-07-28 LAB — CBC
HCT: 30.8 % — ABNORMAL LOW (ref 39.0–52.0)
HEMOGLOBIN: 10.2 g/dL — AB (ref 13.0–17.0)
MCH: 32.7 pg (ref 26.0–34.0)
MCHC: 33.1 g/dL (ref 30.0–36.0)
MCV: 98.7 fL (ref 78.0–100.0)
PLATELETS: 285 10*3/uL (ref 150–400)
RBC: 3.12 MIL/uL — ABNORMAL LOW (ref 4.22–5.81)
RDW: 15.1 % (ref 11.5–15.5)
WBC: 18.8 10*3/uL — AB (ref 4.0–10.5)

## 2017-07-28 LAB — PROTIME-INR
INR: 1.43
Prothrombin Time: 17.3 seconds — ABNORMAL HIGH (ref 11.4–15.2)

## 2017-07-28 LAB — HEPARIN LEVEL (UNFRACTIONATED)
Heparin Unfractionated: 0.83 IU/mL — ABNORMAL HIGH (ref 0.30–0.70)
Heparin Unfractionated: 0.47 IU/mL (ref 0.30–0.70)

## 2017-07-28 LAB — GLUCOSE, CAPILLARY
GLUCOSE-CAPILLARY: 220 mg/dL — AB (ref 65–99)
Glucose-Capillary: 290 mg/dL — ABNORMAL HIGH (ref 65–99)
Glucose-Capillary: 350 mg/dL — ABNORMAL HIGH (ref 65–99)

## 2017-07-28 MED ORDER — ONDANSETRON 4 MG PO TBDP
4.0000 mg | ORAL_TABLET | Freq: Four times a day (QID) | ORAL | Status: DC | PRN
  Administered 2017-07-28: 4 mg via ORAL
  Filled 2017-07-28: qty 1

## 2017-07-28 MED ORDER — ACETAMINOPHEN 325 MG PO TABS
650.0000 mg | ORAL_TABLET | Freq: Four times a day (QID) | ORAL | Status: DC | PRN
  Filled 2017-07-28: qty 2

## 2017-07-28 NOTE — Progress Notes (Signed)
[facility] Progress Note   Subjective:  Had TDC and HD yest- removed 3700- tolerated well .  Nauseated this AM- sore throat and tired of having same vein on the left hand used for blood draws  Objective Vitals:   07/27/17 1700 07/27/17 1848 07/27/17 2112 07/28/17 0453  BP: 93/70 105/73 106/74 122/87  Pulse: 98 (!) 103 (!) 116 (!) 106  Resp:  18 18 18   Temp:  (!) 97.4 F (36.3 C) (!) 97.5 F (36.4 C) 97.8 F (36.6 C)  TempSrc:  Oral Oral Oral  SpO2:  98% 98% 100%  Weight:  94.3 kg (207 lb 14.3 oz) 94.3 kg (207 lb 14.3 oz)   Height:       Physical Exam General: Well appearing, NAD. Heart: RRR; no murmur Lungs: CTAB Abdomen: soft, non-tender Extremities: No LE edema. Dialysis Access: R AVF with two aneurysmal areas, patent but with strong pulsatility up entire R arm  Additional Objective Labs: Basic Metabolic Panel: Recent Labs  Lab 07/22/17 0745  07/24/17 0725  07/25/17 0305 07/26/17 0718 07/27/17 0845  NA 135   < > 129*   < > 130* 130* 131*  K 5.0   < > 4.8   < > 4.4 4.5 4.7  CL 101   < > 93*  --  92* 92* 95*  CO2 22   < > 22  --  25 23 21*  GLUCOSE 176*   < > 141*   < > 176* 243* 168*  BUN 55*   < > 47*  --  40* 58* 66*  CREATININE 10.24*   < > 9.35*  --  8.22* 10.92* 11.69*  CALCIUM 7.6*   < > 7.8*  --  7.9* 8.0* 7.8*  PHOS 5.6*  --  6.3*  --   --   --  4.3   < > = values in this interval not displayed.   Liver Function Tests: Recent Labs  Lab 07/22/17 0745 07/24/17 0725 07/27/17 0845  ALBUMIN 3.2* 3.4* 3.2*   CBC: Recent Labs  Lab 07/23/17 0542  07/24/17 0657  07/25/17 0305 07/26/17 0718 07/27/17 0410 07/28/17 0558  WBC 11.2*   < > 12.4*  --  11.0* 11.3* 13.2* 18.8*  NEUTROABS 7.5  --   --   --   --  7.0 8.1*  --   HGB 10.8*   < > 10.9*   < > 10.3* 9.5* 9.3* 10.2*  HCT 31.7*   < > 30.7*   < > 30.8* 27.3* 26.8* 30.8*  MCV 97.2   < > 95.6  --  97.5 95.8 97.1 98.7  PLT 361   < > 388  --  318 333 402* 285   < > = values in this  interval not displayed.   CBG: Recent Labs  Lab 07/26/17 2037 07/27/17 0738 07/27/17 1105 07/27/17 1316 07/27/17 2109  GLUCAP 295* 166* 161* 187* 292*   Medications: . sodium chloride Stopped (07/27/17 1047)  . sodium chloride    . sodium chloride 10 mL/hr at 07/27/17 0933  . heparin 1,200 Units/hr (07/28/17 0159)   . darbepoetin (ARANESP) injection - DIALYSIS  60 mcg Intravenous Q Mon-HD  . doxercalciferol  1 mcg Intravenous Q T,Th,Sa-HD  . insulin aspart  0-5 Units Subcutaneous QHS  . insulin aspart  0-9 Units Subcutaneous TID WC  . insulin detemir  5 Units Subcutaneous Daily  . lanthanum  1,000 mg Oral TID WC  . multivitamin  1 tablet Oral  QHS  . pantoprazole  40 mg Oral Daily  . polyethylene glycol  17 g Oral Daily  . Warfarin - Pharmacist Dosing Inpatient   Does not apply q1800    [facility]/800 92 kg 2.0 K/ 2.5 Ca  -Heparin 6000 units IV TIW -Hectorol 1 mcg IV TIW -No ESA/Venofer  BMD meds:  -Auryxia 210 mg 2 tabs TID A/C -Sensipar 60 mg PO q evening   Assessment/Plan: 1. Acute LowerGIB: Supra-therapeutic INR on admit requiringVit K. Was stable on conservative management but unfortunately rebled. Colonoscopy 11/21 showing internal hemorrhoids and one polyp. Awaiting therapeutic INR for DC however tells me he has court date next Wednesday and is worried about getting out of the hospital on time. With access issues, he will now most certainly miss this. Spoke to hospitalist about having case manager assist with this. 2. HxDVT/PE: Coumadinheld, then resumed. INR 1.43 today. Now will need to be held again with plan for HD access surgery on Monday 11/26. 3. ESRD: Usual TTS schedule. HD Sat s/p TDC placement- will be due next Tuesday. 4. Hypertension/volume:Was onLosartan 25 mg PO q day, amlodipine 5 mg PO dailybut now stopped.Not getting to OP EDW. Hyponatremia argues for volume overload, UF as tolerated- removed 3700  yest- BP lower than yesterday.  5. Anemia: Hgb 9.3>>10.2. Continue Aranesp 54mcg weekly for now. Last dose 07/22/17.  6. Metabolic bone disease:  Continue Fosrenol and Hectoral, sensipar on hold d/t low Ca.  7. Nutrition: Alb low, Renal diet/fld restrictions. Renal vit/nepro.  8. DM:Per primary.BS remains high. Needs better control.  9. Dialysis Access dysfunction: See above. Near clotted AVF 11/24. VVS consulted.  [facility] Sat and fistulogram with intervention vs new access scheduled for 11/26.    Kenzie Thoreson A   07/28/2017

## 2017-07-28 NOTE — Progress Notes (Signed)
ANTICOAGULATION CONSULT NOTE - Follow Up Consult  Pharmacy Consult for heparin Indication: h/o PE/DVT  Labs: Recent Labs    07/25/17 0305 07/25/17 2237 07/26/17 0718 07/27/17 0410 07/27/17 0845 07/28/17 0034  HGB 10.3*  --  9.5* 9.3*  --   --   HCT 30.8*  --  27.3* 26.8*  --   --   PLT 318  --  333 402*  --   --   LABPROT 14.8  --  13.5 15.2  --   --   INR 1.17  --  1.04 1.21  --   --   HEPARINUNFRC  --  0.14* 0.38  --   --  0.83*  CREATININE 8.22*  --  10.92*  --  11.69*  --     Assessment: 56yo male above goal on heparin after resumed post-procedure; lab drawn from hand >2in below IV line.  Goal of Therapy:  Heparin level 0.3-0.5 units/ml   Plan:  Will decrease heparin gtt by 2 units/kg/hr to 1200 units/hr and check level in [place], PharmD, BCPS  07/28/2017,1:45 AM

## 2017-07-28 NOTE — Progress Notes (Signed)
PROGRESS NOTE    [name]  QVZ:[id number] DOB: [birth year] DOA: 07/15/2017 PCP: Arnoldo Morale, MD  Brief Narrative:Narrative  56 [facility] [place] [facility] [facility] [place] S/p atherectomyright upper extremity fistula with successful use of the same Diastolic heart failure last EF 04/04/2017 normal RV function EF 55%?  Underwent Lexiscan 10/12/2016 EF 41% no ST segment deviation and it was a low risk study mass right atrium possible dialysis hip with adherent thrombus Cocaine HTN Metabolic bone disease Previous homelessness Elevated troponin without CAD-probably secondary to strain pattern from recent PE Diabetes mellitus type TY 2 Prior Acute PE Right fem DVT   Nephrology consulted for dialysis needs. Resume Coumadin with IV heparin bridging. DC home when INR therapeuticABOVE 2.   Assessment & Plan:   Active Problems:   Acute GI bleeding   Hyperkalemia  Acute lower GI bleeding 2/2 hemorrhoids vs semi-pedunculated polyp on colonoscopy 11.21 -In the setting of elevated INR 4.13 on presentation -INR 1.21-->1.04 today 11/223 -heparin for now--needing revision of graft 11/26 and will resume coumadin with overlap after  Mild leukocytosis Check cbc in am and if persisiting, because of vomit would check XR  Non-working fistula RUE Appreciate Vasc Dr. Krystal Clark input [facility] placed L chest 11/24 Will need fistulogram 11/26  Nausea + vomit Unclear casue Adding zofran 4 tid--monitor as still having issue Axr done 11/25 no blockage and is passing gas Seem overall improved  Subtherapeutic INR -coumadin on hold as needing varied surgeries--will resume soon   ESRD on HD TTH SAT -HD as per renal -needs access issues sorted out as above--VVs on board  Hyponatremia, possibly dilutional -improving -euvolemic -na+ 132 from 129 -BMP am  HTN- -losartan  Type 2 DM  -glipizide -BMP am -Sugars 160 range - glipizide has been held from prior to  admission -adding levemir 5 u  Anemia of chronic disease -Additional component 2/2 ESRD -no futher bleed -hg 11.2-->9.5 -CBC am -Continue Aranesp 60 Q Monday    DVT prophylaxis start heparin drip and overlap with Coumadin Code Status:full Family Communication: none Disposition Plan: await revision Consultants: GI  Procedures:colonoscopy today 07/24/17  Antimicrobials: none   Doing ok Some nausea still vomited hlaf o fhis breakfast up No cp Some sore throat No fever no chills Worried about court date   Objective: Vitals:   07/27/17 1848 07/27/17 2112 07/28/17 0453 07/28/17 0832  BP: 105/73 106/74 122/87 98/70  Pulse: (!) 103 (!) 116 (!) 106 100  Resp: 18 18 18 18   Temp: (!) 97.4 F (36.3 C) (!) 97.5 F (36.4 C) 97.8 F (36.6 C) 98.6 F (37 C)  TempSrc: Oral Oral Oral Oral  SpO2: 98% 98% 100% 98%  Weight: 94.3 kg (207 lb 14.3 oz) 94.3 kg (207 lb 14.3 oz)    Height:        Intake/Output Summary (Last 24 hours) at 07/28/2017 1526 Last data filed at 07/28/2017 1300 Gross per 24 hour  Intake 564 ml  Output 3926 ml  Net -3362 ml   Filed Weights   07/26/17 2030 07/27/17 1848 07/27/17 2112  Weight: 97.7 kg (215 lb 6.2 oz) 94.3 kg (207 lb 14.3 oz) 94.3 kg (207 lb 14.3 oz)    Examination:  General exam: 56 yo Male WD WN NAD A&O x3 Respiratory system: Clear to auscultation.   Cardiovascular system: S1 & S2 heard, RRR. No JVD Gastrointestinal system: Abdomen is slightly distended without rebound nor gaurding Central nervous system: Alert and oriented. No focal neurological deficits. Extremities: Symmetric 5 x 5  power.     Data Reviewed: I have personally reviewed following labs and imaging studies  CBC: Recent Labs  Lab 07/23/17 0542  07/24/17 0657 07/24/17 1212 07/25/17 0305 07/26/17 0718 07/27/17 0410 07/28/17 0558  WBC 11.2*   < > 12.4*  --  11.0* 11.3* 13.2* 18.8*  NEUTROABS 7.5  --   --   --   --  7.0 8.1*  --   HGB 10.8*   < > 10.9* 11.2*  10.3* 9.5* 9.3* 10.2*  HCT 31.7*   < > 30.7* 33.0* 30.8* 27.3* 26.8* 30.8*  MCV 97.2   < > 95.6  --  97.5 95.8 97.1 98.7  PLT 361   < > 388  --  318 333 402* 285   < > = values in this interval not displayed.   Basic Metabolic Panel: Recent Labs  Lab 07/22/17 0745 07/23/17 0542 07/24/17 0725 07/24/17 1212 07/25/17 0305 07/26/17 0718 07/27/17 0845  NA 135 131* 129* 132* 130* 130* 131*  K 5.0 4.8 4.8 4.3 4.4 4.5 4.7  CL 101 94* 93*  --  92* 92* 95*  CO2 22 24 22   --  25 23 21*  GLUCOSE 176* 133* 141* 155* 176* 243* 168*  BUN 55* 35* 47*  --  40* 58* 66*  CREATININE 10.24* 7.72* 9.35*  --  8.22* 10.92* 11.69*  CALCIUM 7.6* 7.5* 7.8*  --  7.9* 8.0* 7.8*  PHOS 5.6*  --  6.3*  --   --   --  4.3   GFR: Estimated Creatinine Clearance: 8.3 mL/min (A) (by C-G formula based on SCr of 11.69 mg/dL (H)). Liver Function Tests: Recent Labs  Lab 07/22/17 0745 07/24/17 0725 07/27/17 0845  ALBUMIN 3.2* 3.4* 3.2*   No results for input(s): LIPASE, AMYLASE in the last 168 hours. No results for input(s): AMMONIA in the last 168 hours. Coagulation Profile: Recent Labs  Lab 07/24/17 0657 07/25/17 0305 07/26/17 0718 07/27/17 0410 07/28/17 0558  INR 1.21 1.17 1.04 1.21 1.43   Cardiac Enzymes: No results for input(s): CKTOTAL, CKMB, CKMBINDEX, TROPONINI in the last 168 hours. BNP (last 3 results) No results for input(s): PROBNP in the last 8760 hours. HbA1C: No results for input(s): HGBA1C in the last 72 hours. CBG: Recent Labs  Lab 07/27/17 0738 07/27/17 1105 07/27/17 1316 07/27/17 2109 07/28/17 1157  GLUCAP 166* 161* 187* 292* 290*   Lipid Profile: No results for input(s): CHOL, HDL, LDLCALC, TRIG, CHOLHDL, LDLDIRECT in the last 72 hours. Thyroid Function Tests: No results for input(s): TSH, T4TOTAL, FREET4, T3FREE, THYROIDAB in the last 72 hours. Anemia Panel: No results for input(s): VITAMINB12, FOLATE, FERRITIN, TIBC, IRON, RETICCTPCT in the last 72 hours. Sepsis  Labs: No results for input(s): PROCALCITON, LATICACIDVEN in the last 168 hours.  No results found for this or any previous visit (from the past 240 hour(s)).       Radiology Studies: Dg Chest Port 1 View  Result Date: 07/27/2017 CLINICAL DATA:  Dialysis catheter insertion. EXAM: PORTABLE CHEST 1 VIEW COMPARISON:  04/02/2017 FINDINGS: Patient has a left jugular dialysis catheter. Catheter tip in the lower SVC. Low lung volumes. Few patchy densities along the left costophrenic angle. Negative for a pneumothorax. Heart size is enlarged but accentuated by the low lung volumes. IMPRESSION: Dialysis catheter tip in the SVC.  Negative for pneumothorax. Low lung volumes with few densities near the left costophrenic angle. Findings could represent atelectasis or small effusion. Electronically Signed   By: Scherrie Gerlach.D.  On: 07/27/2017 11:34   Dg Abd 2 Views  Result Date: 07/28/2017 CLINICAL DATA:  Patient with generalized abdominal pain and vomiting. EXAM: ABDOMEN - 2 VIEW COMPARISON:  CT abdomen pelvis 07/15/2017 FINDINGS: Oral contrast material within the colon. Gas is demonstrated within nondilated loops of large and small bowel in a nonobstructed pattern. No free intraperitoneal air. Lumbar spine degenerative changes. IMPRESSION: Nonobstructed bowel gas pattern. Electronically Signed   By: Lovey Newcomer M.D.   On: 07/28/2017 14:30   Dg Fluoro Guide Cv Line-no Report  Result Date: 07/27/2017 Fluoroscopy was utilized by the requesting physician.  No radiographic interpretation.      Scheduled Meds: . darbepoetin (ARANESP) injection - DIALYSIS  60 mcg Intravenous Q Mon-HD  . doxercalciferol  1 mcg Intravenous Q T,Th,Sa-HD  . insulin aspart  0-5 Units Subcutaneous QHS  . insulin aspart  0-9 Units Subcutaneous TID WC  . insulin detemir  5 Units Subcutaneous Daily  . lanthanum  1,000 mg Oral TID WC  . multivitamin  1 tablet Oral QHS  . pantoprazole  40 mg Oral Daily  . polyethylene glycol   17 g Oral Daily  . Warfarin - Pharmacist Dosing Inpatient   Does not apply q1800   Continuous Infusions: . sodium chloride Stopped (07/27/17 1047)  . sodium chloride    . sodium chloride 10 mL/hr at 07/27/17 0933  . heparin 1,200 Units/hr (07/28/17 1208)     LOS: 13 days    Verneita Griffes, MD [facility] [phone]

## 2017-07-28 NOTE — Progress Notes (Signed)
   VASCULAR SURGERY ASSESSMENT & PLAN:   1 Day Post-Op s/p: Diatek  For fistulogram tomorrw   SUBJECTIVE:   No complaints  PHYSICAL EXAM:   Vitals:   07/27/17 1700 07/27/17 1848 07/27/17 2112 07/28/17 0453  BP: 93/70 105/73 106/74 122/87  Pulse: 98 (!) 103 (!) 116 (!) 106  Resp:  18 18 18   Temp:  (!) 97.4 F (36.3 C) (!) 97.5 F (36.4 C) 97.8 F (36.6 C)  TempSrc:  Oral Oral Oral  SpO2:  98% 98% 100%  Weight:  207 lb 14.3 oz (94.3 kg) 207 lb 14.3 oz (94.3 kg)   Height:       Right upper fistula is pulsatile.  LABS:   Lab Results  Component Value Date   WBC 18.8 (H) 07/28/2017   HGB 10.2 (L) 07/28/2017   HCT 30.8 (L) 07/28/2017   MCV 98.7 07/28/2017   PLT 285 07/28/2017   Lab Results  Component Value Date   CREATININE 11.69 (H) 07/27/2017   Lab Results  Component Value Date   INR 1.43 07/28/2017   CBG (last 3)  Recent Labs    07/27/17 1105 07/27/17 1316 07/27/17 2109  GLUCAP 161* 187* 292*    PROBLEM LIST:    Active Problems:   Acute GI bleeding   Hyperkalemia   CURRENT MEDS:   . darbepoetin (ARANESP) injection - DIALYSIS  60 mcg Intravenous Q Mon-HD  . doxercalciferol  1 mcg Intravenous Q T,Th,Sa-HD  . insulin aspart  0-5 Units Subcutaneous QHS  . insulin aspart  0-9 Units Subcutaneous TID WC  . insulin detemir  5 Units Subcutaneous Daily  . lanthanum  1,000 mg Oral TID WC  . multivitamin  1 tablet Oral QHS  . oxyCODONE-acetaminophen      . pantoprazole  40 mg Oral Daily  . polyethylene glycol  17 g Oral Daily  . Warfarin - Pharmacist Dosing Inpatient   Does not apply [place]: [phone] Office: [phone] 07/28/2017

## 2017-07-28 NOTE — Progress Notes (Signed)
ANTICOAGULATION CONSULT NOTE - Follow Up Consult  Pharmacy Consult for Heparin Indication: PE / DVT history  Allergies  Allergen Reactions  . No Known Allergies     Patient Measurements: Height: 5\' 11"  (180.3 cm) Weight: 207 lb 14.3 oz (94.3 kg) IBW/kg (Calculated) : 75.3 Heparin Dosing Weight: 94 kg  Vital Signs: Temp: 98.6 F (37 C) (11/25 0832) Temp Source: Oral (11/25 0832) BP: 98/70 (11/25 0832) Pulse Rate: 100 (11/25 0832)  Labs: Recent Labs    07/26/17 0718 07/27/17 0410 07/27/17 0845 07/28/17 0034 07/28/17 0558 07/28/17 0948  HGB 9.5* 9.3*  --   --  10.2*  --   HCT 27.3* 26.8*  --   --  30.8*  --   PLT 333 402*  --   --  285  --   LABPROT 13.5 15.2  --   --  17.3*  --   INR 1.04 1.21  --   --  1.43  --   HEPARINUNFRC 0.38  --   --  0.83*  --  0.47  CREATININE 10.92*  --  11.69*  --   --   --     Estimated Creatinine Clearance: 8.3 mL/min (A) (by C-G formula based on SCr of 11.69 mg/dL (H)).  Assessment: 56 year old male to continue heparin for DVT/PE history, warfarin on hold until after procedure Monday  Heparin level now therapeutic at 0.47  Goal of Therapy:  Heparin level = 0.3 to 0.7 Monitor platelets   Plan:  Continue heparin at 1200 units / hr Daily heparin level, CBC  Thank you Anette Guarneri, PharmD [phone]   07/28/2017,11:14 AM

## 2017-07-29 ENCOUNTER — Encounter (HOSPITAL_COMMUNITY)
Admission: EM | Disposition: A | Discharge status: Home / Self Care | Payer: Self-pay | Source: Home / Self Care | Attending: Family Medicine

## 2017-07-29 ENCOUNTER — Encounter (HOSPITAL_COMMUNITY): Payer: Self-pay | Admitting: Vascular Surgery

## 2017-07-29 HISTORY — PX: PERIPHERAL VASCULAR BALLOON ANGIOPLASTY: CATH118281

## 2017-07-29 HISTORY — PX: A/V FISTULAGRAM: CATH118298

## 2017-07-29 LAB — GLUCOSE, CAPILLARY
GLUCOSE-CAPILLARY: 146 mg/dL — AB (ref 65–99)
GLUCOSE-CAPILLARY: 176 mg/dL — AB (ref 65–99)
Glucose-Capillary: 141 mg/dL — ABNORMAL HIGH (ref 65–99)
Glucose-Capillary: 227 mg/dL — ABNORMAL HIGH (ref 65–99)

## 2017-07-29 LAB — CBC
HEMATOCRIT: 29.1 % — AB (ref 39.0–52.0)
HEMOGLOBIN: 10 g/dL — AB (ref 13.0–17.0)
MCH: 33.8 pg (ref 26.0–34.0)
MCHC: 34.4 g/dL (ref 30.0–36.0)
MCV: 98.3 fL (ref 78.0–100.0)
PLATELETS: 260 10*3/uL (ref 150–400)
RBC: 2.96 MIL/uL — AB (ref 4.22–5.81)
RDW: 15.4 % (ref 11.5–15.5)
WBC: 13 10*3/uL — AB (ref 4.0–10.5)

## 2017-07-29 LAB — BASIC METABOLIC PANEL
ANION GAP: 16 — AB (ref 5–15)
BUN: 54 mg/dL — ABNORMAL HIGH (ref 6–20)
CALCIUM: 8.5 mg/dL — AB (ref 8.9–10.3)
CO2: 23 mmol/L (ref 22–32)
Chloride: 91 mmol/L — ABNORMAL LOW (ref 101–111)
Creatinine, Ser: 10.74 mg/dL — ABNORMAL HIGH (ref 0.61–1.24)
GFR, EST AFRICAN AMERICAN: 5 mL/min — AB (ref >60)
GFR, EST NON AFRICAN AMERICAN: 5 mL/min — AB (ref >60)
GLUCOSE: 216 mg/dL — AB (ref 65–99)
POTASSIUM: 4.5 mmol/L (ref 3.5–5.1)
Sodium: 130 mmol/L — ABNORMAL LOW (ref 135–145)

## 2017-07-29 LAB — HEPARIN LEVEL (UNFRACTIONATED): HEPARIN UNFRACTIONATED: 0.38 [IU]/mL (ref 0.30–0.70)

## 2017-07-29 LAB — PROTIME-INR
INR: 1.51
PROTHROMBIN TIME: 18.1 s — AB (ref 11.4–15.2)

## 2017-07-29 SURGERY — A/V FISTULAGRAM
Anesthesia: LOCAL | Laterality: Right

## 2017-07-29 MED ORDER — WARFARIN SODIUM 7.5 MG PO TABS
7.5000 mg | ORAL_TABLET | Freq: Once | ORAL | Status: AC
  Administered 2017-07-29: 7.5 mg via ORAL
  Filled 2017-07-29: qty 1

## 2017-07-29 MED ORDER — HEPARIN (PORCINE) IN NACL 2-0.9 UNIT/ML-% IJ SOLN
INTRAMUSCULAR | Status: AC
  Filled 2017-07-29: qty 500

## 2017-07-29 MED ORDER — DOCUSATE SODIUM 283 MG RE ENEM
1.0000 | ENEMA | RECTAL | Status: AC
  Administered 2017-07-29: 283 mg via RECTAL
  Filled 2017-07-29: qty 1

## 2017-07-29 MED ORDER — ENOXAPARIN SODIUM 100 MG/ML ~~LOC~~ SOLN
1.0000 mg/kg | SUBCUTANEOUS | Status: DC
  Administered 2017-07-29: 95 mg via SUBCUTANEOUS
  Filled 2017-07-29: qty 1

## 2017-07-29 MED ORDER — IODIXANOL 320 MG/ML IV SOLN
INTRAVENOUS | Status: DC | PRN
  Administered 2017-07-29: 60 mL via INTRAVENOUS

## 2017-07-29 MED ORDER — HEPARIN SODIUM (PORCINE) 1000 UNIT/ML IJ SOLN
INTRAMUSCULAR | Status: AC
  Filled 2017-07-29: qty 1

## 2017-07-29 MED ORDER — HEPARIN (PORCINE) IN NACL 2-0.9 UNIT/ML-% IJ SOLN
INTRAMUSCULAR | Status: AC | PRN
  Administered 2017-07-29: 500 mL

## 2017-07-29 MED ORDER — MIDAZOLAM HCL 2 MG/2ML IJ SOLN
INTRAMUSCULAR | Status: DC | PRN
  Administered 2017-07-29: 1 mg via INTRAVENOUS

## 2017-07-29 MED ORDER — HEPARIN SODIUM (PORCINE) 1000 UNIT/ML IJ SOLN
INTRAMUSCULAR | Status: DC | PRN
  Administered 2017-07-29: 6000 [IU] via INTRAVENOUS

## 2017-07-29 MED ORDER — DARBEPOETIN ALFA 60 MCG/0.3ML IJ SOSY
60.0000 ug | PREFILLED_SYRINGE | INTRAMUSCULAR | Status: DC
  Administered 2017-07-30: 60 ug via INTRAVENOUS
  Filled 2017-07-29: qty 0.3

## 2017-07-29 MED ORDER — LIDOCAINE HCL (PF) 1 % IJ SOLN
INTRAMUSCULAR | Status: DC | PRN
  Administered 2017-07-29: 10 mL

## 2017-07-29 MED ORDER — FENTANYL CITRATE (PF) 100 MCG/2ML IJ SOLN
INTRAMUSCULAR | Status: DC | PRN
  Administered 2017-07-29: 50 ug via INTRAVENOUS
  Administered 2017-07-29: 25 ug via INTRAVENOUS

## 2017-07-29 MED ORDER — HEPARIN (PORCINE) IN NACL 100-0.45 UNIT/ML-% IJ SOLN
1200.0000 [IU]/h | INTRAMUSCULAR | Status: DC
  Administered 2017-07-29: 1200 [IU]/h via INTRAVENOUS
  Filled 2017-07-29: qty 250

## 2017-07-29 MED ORDER — MIDAZOLAM HCL 2 MG/2ML IJ SOLN
INTRAMUSCULAR | Status: AC
  Filled 2017-07-29: qty 2

## 2017-07-29 MED ORDER — FENTANYL CITRATE (PF) 100 MCG/2ML IJ SOLN
INTRAMUSCULAR | Status: AC
  Filled 2017-07-29: qty 2

## 2017-07-29 SURGICAL SUPPLY — 18 items
BALLN LUTONIX AV 8X60X75 (BALLOONS) ×2
BALLN MUSTANG 6X60X75 (BALLOONS) ×2
BALLN MUSTANG 8X60X75 (BALLOONS) ×2
BALLOON LUTONIX AV 8X60X75 (BALLOONS) ×1 IMPLANT
BALLOON MUSTANG 6X60X75 (BALLOONS) ×1 IMPLANT
BALLOON MUSTANG 8X60X75 (BALLOONS) ×1 IMPLANT
COVER PRB 48X5XTLSCP FOLD TPE (BAG) ×1 IMPLANT
COVER PROBE 5X48 (BAG) ×1
KIT ENCORE 26 ADVANTAGE (KITS) ×2 IMPLANT
KIT MICROINTRODUCER STIFF 5F (SHEATH) ×2 IMPLANT
KIT PV (KITS) ×2 IMPLANT
SHEATH PINNACLE 5F 10CM (SHEATH) ×2 IMPLANT
SHEATH PINNACLE R/O II 6F 4CM (SHEATH) ×2 IMPLANT
SHEATH PINNACLE R/O II 7F 4CM (SHEATH) ×2 IMPLANT
STOPCOCK MORSE 400PSI 3WAY (MISCELLANEOUS) ×2 IMPLANT
TRAY PV CATH (CUSTOM PROCEDURE TRAY) ×2 IMPLANT
TUBING CIL FLEX 10 FLL-RA (TUBING) ×2 IMPLANT
WIRE HITORQ VERSACORE ST 145CM (WIRE) ×2 IMPLANT

## 2017-07-29 NOTE — Progress Notes (Addendum)
ANTICOAGULATION CONSULT NOTE - Follow Up Consult  Pharmacy Consult for Lovenox and warfarin  Indication: PE / DVT history  Allergies  Allergen Reactions  . No Known Allergies     Patient Measurements: Height: 5\' 11"  (180.3 cm) Weight: 208 lb 1.8 oz (94.4 kg) IBW/kg (Calculated) : 75.3 Heparin Dosing Weight: 94 kg  Vital Signs: Temp: 98.1 F (36.7 C) (11/26 0603) Temp Source: Oral (11/26 0603) BP: 132/85 (11/26 0911) Pulse Rate: 0 (11/26 0911)  Labs: Recent Labs    07/27/17 0410 07/27/17 0845 07/28/17 0034 07/28/17 0558 07/28/17 0948 07/29/17 0229  HGB 9.3*  --   --  10.2*  --  10.0*  HCT 26.8*  --   --  30.8*  --  29.1*  PLT 402*  --   --  285  --  260  LABPROT 15.2  --   --  17.3*  --  18.1*  INR 1.21  --   --  1.43  --  1.51  HEPARINUNFRC  --   --  0.83*  --  0.47 0.38  CREATININE  --  11.69*  --   --   --  10.74*    Estimated Creatinine Clearance: 9 mL/min (A) (by C-G formula based on SCr of 10.74 mg/dL (H)).  Assessment: 56 year old male to start heparin for DVT/PE history while warfarin on hold.   S/p fistulogram this am. To transition to Lovenox (instead of heparin infusion) post procedure per TR[facility].  INR 1.51 with last dose of warfarin on 11/23 pm. CBC stable.   Goal of Therapy:  INR 2 - 3 Monitor platelets   Plan:  -Lovenox 95 mg (1 mg/kg) subcutaneously every 24 hours based on ESRD -Warfarin 7.5 mg x 1 this evening  -Daily INR and CBC   Vincenza Hews, PharmD, BCPS 07/29/2017, 9:56 AM

## 2017-07-29 NOTE — Progress Notes (Signed)
Educated pt via teach back on how to administer Lovenox, pt was able to self administer medicine SQ.

## 2017-07-29 NOTE — Progress Notes (Signed)
This encounter was created in error - please disregard.

## 2017-07-29 NOTE — Consult Note (Addendum)
   [facility] CM Inpatient Consult   79/39/0300  [name] [birth year] [id number]    [facility] Care Management follow up.  Spoke with patient at bedside to remind him of [place] Management follow up.   He states he thinks he will need [facility] LCSW assistance with housing. States he currently resides with a friend at [address] However, he reports this is temporary. Requests that writer make referral for [facility] LCSW follow up as well.   Patient also requesting assistance with SCAT passes. Request sent to [facility] Management office.  Due to lengthy hospitalization will also make referral to [facility] for follow up as well.   Addendum: Provided  (2) one-way 10 SCAT ride passes as patient states he did not receive the recent SCAT passes that he states were mailed to him recently by [facility] LCSW.   Marthenia Rolling, MSN-Ed, RN,BSN [facility] Liaison [phone]

## 2017-07-29 NOTE — Progress Notes (Signed)
[facility] Progress Note  Subjective: constipated  Vitals:   07/29/17 0901 07/29/17 0906 07/29/17 0911 07/29/17 1024  BP: 126/78 129/81 132/85 136/82  Pulse: 96 94 (!) 0 99  Resp: 11 11 (!) 0 18  Temp:    98.2 F (36.8 C)  TempSrc:    Oral  SpO2: 100% 100% (!) 0% 98%  Weight:      Height:        Inpatient medications: . [START ON 07/30/2017] darbepoetin (ARANESP) injection - DIALYSIS  60 mcg Intravenous Q Tue-HD  . doxercalciferol  1 mcg Intravenous Q T,Th,Sa-HD  . enoxaparin (LOVENOX) injection  1 mg/kg Subcutaneous Q24H  . insulin aspart  0-5 Units Subcutaneous QHS  . insulin aspart  0-9 Units Subcutaneous TID WC  . insulin detemir  5 Units Subcutaneous Daily  . lanthanum  1,000 mg Oral TID WC  . multivitamin  1 tablet Oral QHS  . pantoprazole  40 mg Oral Daily  . polyethylene glycol  17 g Oral Daily  . warfarin  7.5 mg Oral ONCE-1800  . Warfarin - Pharmacist Dosing Inpatient   Does not apply q1800   . sodium chloride Stopped (07/27/17 1047)  . sodium chloride    . sodium chloride 10 mL/hr at 07/27/17 0933   sodium chloride, sodium chloride, acetaminophen, alteplase, heparin, lidocaine (PF), lidocaine-prilocaine, menthol-cetylpyridinium, ondansetron, oxyCODONE-acetaminophen, pentafluoroprop-tetrafluoroeth, senna  Exam: Alert, no distress No jvd Chest cta bilat RRR no mrg Abd soft ntnd  EXt no LE edeam R AVF +bruit  Dialysis: [facility] 4h    92kg   2/2.5   Hep 6000   -hect 1 ug -no esa/ Fe -Auryxia 210 mg 2 tabs TID A/C -Sensipar 60 mg PO q evening        Impression: 1. Acute LowerGIB: Supra-therapeutic INR on admit requiringVit K. Was stable on conservative management but unfortunately rebled.Colonoscopy 11/21 showing internal hemorrhoids and one polyp.Awaiting therapeutic INR for DC 2. Dialysis Access dysfunction:See above. Near clotted AVF 11/24. VVS consulted. SP new TDC Satand fistulogram today with venoplasty of tandem 90%  stenoses of the R BC AVF.  3. HxDVT/PE: Coumadinheld, then resumed 4. ESRD: Usual TTS HD 5. Hypertension/volume:Was onLosartan 25 mg PO q day, amlodipine 5 mg PO dailybut now stopped.Not getting to OP EDW.Hyponatremia argues for volume overload  6. Anemia: Hgb9.3>>10.2. Continue Aranesp 47mcg weekly for now.Last dose 11/91/47. 7. Metabolic bone disease: Continue Fosrenol and Hectoral, sensipar on hold d/t low Ca.  8. Nutrition: Alb low,Renal diet/fld restrictions. Renal vit/nepro. 9. DM:Per primary    Plan - HD tomorrow 1st shift   Kelly Splinter MD [place] pager [phone]   07/29/2017, 2:31 PM    Recent Labs  Lab 07/24/17 0725  07/26/17 0718 07/27/17 0845 07/29/17 0229  NA 129*   < > 130* 131* 130*  K 4.8   < > 4.5 4.7 4.5  CL 93*   < > 92* 95* 91*  CO2 22   < > 23 21* 23  GLUCOSE 141*   < > 243* 168* 216*  BUN 47*   < > 58* 66* 54*  CREATININE 9.35*   < > 10.92* 11.69* 10.74*  CALCIUM 7.8*   < > 8.0* 7.8* 8.5*  PHOS 6.3*  --   --  4.3  --    < > = values in this interval not displayed.   Recent Labs  Lab 07/24/17 0725 07/27/17 0845  ALBUMIN 3.4* 3.2*   Recent Labs  Lab 07/23/17 0542  07/26/17 0718 07/27/17 0410 07/28/17 0558 07/29/17 0229  WBC 11.2*   < > 11.3* 13.2* 18.8* 13.0*  NEUTROABS 7.5  --  7.0 8.1*  --   --   HGB 10.8*   < > 9.5* 9.3* 10.2* 10.0*  HCT 31.7*   < > 27.3* 26.8* 30.8* 29.1*  MCV 97.2   < > 95.8 97.1 98.7 98.3  PLT 361   < > 333 402* 285 260   < > = values in this interval not displayed.   Iron/TIBC/Ferritin/ %Sat    Component Value Date/Time   IRON 38 (L) 01/27/2015 0300   TIBC 185 (L) 01/27/2015 0300   FERRITIN 316 01/27/2015 0300   IRONPCTSAT 21 01/27/2015 0300

## 2017-07-29 NOTE — Progress Notes (Signed)
PROGRESS NOTE    [name]  TFT:[id number] DOB: 02-25-1961 DOA: 07/15/2017 PCP: Arnoldo Morale, MD  Brief Narrative:Narrative  56 year [place] S/p atherectomyright upper extremity fistula with successful use of the same Diastolic heart failure last EF 04/04/2017 normal RV function EF 55%?  Underwent Lexiscan 10/12/2016 EF 41% no ST segment deviation and it was a low risk study mass right atrium possible dialysis hip with adherent thrombus Cocaine HTN Metabolic bone disease Previous homelessness Elevated troponin without CAD-probably secondary to strain pattern from recent PE Diabetes mellitus type TY 2 Prior Acute PE Right fem DVT   Nephrology consulted for dialysis needs. Resume Coumadin with IV heparin bridging. DC home when INR therapeuticABOVE 2.   Assessment & Plan:   Active Problems:   Acute GI bleeding   Hyperkalemia  Acute lower GI bleeding 2/2 hemorrhoids vs semi-pedunculated polyp on colonoscopy 11.21 -In the setting of elevated INR 4.13 on presentation -INR 1.21-->1.04 today 11/223 -heparin changed to lovenox, if able to give himself and has had documented stabiltiy without GI bleeds, maybe home am? - resume coumadin with overlap and INR in 1 week  Mild leukocytosis Check cbc in am and if persisiting, because of vomit would check XR  Non-working fistula RUE Appreciate Vasc Dr. Krystal Clark input [facility] placed L chest 11/24 venorepair done 11/26--an use maybe access in 1 week Needs removal TDC once RUEaccess working again  Nausea + vomit Unclear casue Adding zofran 4 tid--monitor as still having issue Axr done 11/25 no blockage and is passing gas Seem overall improved  Subtherapeutic INR -coumadin on hold as needing varied surgeries--resumed 11/26 post-op   ESRD on HD TTH SAT -HD as per renal  Hyponatremia, possibly dilutional -improving -euvolemic -na+ 132 from 129 -BMP  am  HTN- -losartan  Type 2 DM  -glipizide -BMP am -Sugars 160 220 and a little bette than prior - glipizide has been held from prior to admission -adding levemir 5 u  Anemia of chronic disease -Additional component 2/2 ESRD -no futher bleed -hg 11.2-->9.5 -CBC am -Continue Aranesp 60 Q Monday    DVT prophylaxis start heparin drip and overlap with Coumadin Code Status:full Family Communication: none Disposition Plan: await revision Consultants: GI  Procedures:colonoscopy today 07/24/17  Antimicrobials: none   Fair no furthe rn Ate food Access seems to have been de-clotted Joint pains but no fever nor chills   Objective: Vitals:   07/29/17 0901 07/29/17 0906 07/29/17 0911 07/29/17 1024  BP: 126/78 129/81 132/85 136/82  Pulse: 96 94 (!) 0 99  Resp: 11 11 (!) 0 18  Temp:    98.2 F (36.8 C)  TempSrc:    Oral  SpO2: 100% 100% (!) 0% 98%  Weight:      Height:        Intake/Output Summary (Last 24 hours) at 07/29/2017 1428 Last data filed at 07/29/2017 1219 Gross per 24 hour  Intake 456 ml  Output 725 ml  Net -269 ml   Filed Weights   07/27/17 1848 07/27/17 2112 07/28/17 2126  Weight: 94.3 kg (207 lb 14.3 oz) 94.3 kg (207 lb 14.3 oz) 94.4 kg (208 lb 1.8 oz)    Examination:  General exam: 56 yo Male WD WN NAD A&O x3 Respiratory system: Clear to auscultation.   Cardiovascular system: S1 & S2 heard, RRR. No JVD Gastrointestinal system: Abdomen non-distended and no rebound nor gaurding Central nervous system: Alert and oriented. No focal neurological deficits. Extremities: Symmetric 5 x 5 power.  Data Reviewed: I have personally reviewed following labs and imaging studies  CBC: Recent Labs  Lab 07/23/17 0542  07/25/17 0305 07/26/17 0718 07/27/17 0410 07/28/17 0558 07/29/17 0229  WBC 11.2*   < > 11.0* 11.3* 13.2* 18.8* 13.0*  NEUTROABS 7.5  --   --  7.0 8.1*  --   --   HGB 10.8*   < > 10.3* 9.5* 9.3* 10.2* 10.0*  HCT 31.7*   < > 30.8* 27.3*  26.8* 30.8* 29.1*  MCV 97.2   < > 97.5 95.8 97.1 98.7 98.3  PLT 361   < > 318 333 402* 285 260   < > = values in this interval not displayed.   Basic Metabolic Panel: Recent Labs  Lab 07/24/17 0725 07/24/17 1212 07/25/17 0305 07/26/17 0718 07/27/17 0845 07/29/17 0229  NA 129* 132* 130* 130* 131* 130*  K 4.8 4.3 4.4 4.5 4.7 4.5  CL 93*  --  92* 92* 95* 91*  CO2 22  --  25 23 21* 23  GLUCOSE 141* 155* 176* 243* 168* 216*  BUN 47*  --  40* 58* 66* 54*  CREATININE 9.35*  --  8.22* 10.92* 11.69* 10.74*  CALCIUM 7.8*  --  7.9* 8.0* 7.8* 8.5*  PHOS 6.3*  --   --   --  4.3  --    GFR: Estimated Creatinine Clearance: 9 mL/min (A) (by C-G formula based on SCr of 10.74 mg/dL (H)). Liver Function Tests: Recent Labs  Lab 07/24/17 0725 07/27/17 0845  ALBUMIN 3.4* 3.2*   No results for input(s): LIPASE, AMYLASE in the last 168 hours. No results for input(s): AMMONIA in the last 168 hours. Coagulation Profile: Recent Labs  Lab 07/25/17 0305 07/26/17 0718 07/27/17 0410 07/28/17 0558 07/29/17 0229  INR 1.17 1.04 1.21 1.43 1.51   Cardiac Enzymes: No results for input(s): CKTOTAL, CKMB, CKMBINDEX, TROPONINI in the last 168 hours. BNP (last 3 results) No results for input(s): PROBNP in the last 8760 hours. HbA1C: No results for input(s): HGBA1C in the last 72 hours. CBG: Recent Labs  Lab 07/28/17 1157 07/28/17 1703 07/28/17 2121 07/29/17 0727 07/29/17 1222  GLUCAP 290* 220* 350* 146* 176*   Lipid Profile: No results for input(s): CHOL, HDL, LDLCALC, TRIG, CHOLHDL, LDLDIRECT in the last 72 hours. Thyroid Function Tests: No results for input(s): TSH, T4TOTAL, FREET4, T3FREE, THYROIDAB in the last 72 hours. Anemia Panel: No results for input(s): VITAMINB12, FOLATE, FERRITIN, TIBC, IRON, RETICCTPCT in the last 72 hours. Sepsis Labs: No results for input(s): PROCALCITON, LATICACIDVEN in the last 168 hours.  No results found for this or any previous visit (from the past 240  hour(s)).       Radiology Studies: Dg Abd 2 Views  Result Date: 07/28/2017 CLINICAL DATA:  Patient with generalized abdominal pain and vomiting. EXAM: ABDOMEN - 2 VIEW COMPARISON:  CT abdomen pelvis 07/15/2017 FINDINGS: Oral contrast material within the colon. Gas is demonstrated within nondilated loops of large and small bowel in a nonobstructed pattern. No free intraperitoneal air. Lumbar spine degenerative changes. IMPRESSION: Nonobstructed bowel gas pattern. Electronically Signed   By: Lovey Newcomer M.D.   On: 07/28/2017 14:30      Scheduled Meds: . [START ON 07/30/2017] darbepoetin (ARANESP) injection - DIALYSIS  60 mcg Intravenous Q Tue-HD  . doxercalciferol  1 mcg Intravenous Q T,Th,Sa-HD  . insulin aspart  0-5 Units Subcutaneous QHS  . insulin aspart  0-9 Units Subcutaneous TID WC  . insulin detemir  5 Units Subcutaneous  Daily  . lanthanum  1,000 mg Oral TID WC  . multivitamin  1 tablet Oral QHS  . pantoprazole  40 mg Oral Daily  . polyethylene glycol  17 g Oral Daily  . warfarin  7.5 mg Oral ONCE-1800  . Warfarin - Pharmacist Dosing Inpatient   Does not apply q1800   Continuous Infusions: . sodium chloride Stopped (07/27/17 1047)  . sodium chloride    . sodium chloride 10 mL/hr at 07/27/17 0933  . heparin 1,200 Units/hr (07/29/17 1308)     LOS: 14 days    Verneita Griffes, MD [facility] [phone]

## 2017-07-29 NOTE — Care Management Note (Signed)
Case Management Note  Patient Details  Name: [name] MRN: [id number] Date of Birth: [birth year]  Subjective/Objective:     CM following for progression and d/c planning.                Action/Plan: 07/29/2017 Per Dr Verlon Au this pt may d/c to home on Lovenox. This CM reviewed  Medicaid list of preferred medications and Lovenox is a preferred medication so pt will be able to obtain this medication at minimal cost.  Pt has been given a few SCAT passes by the [facility] coordinator, Juel Burrow.    Expected Discharge Date:     07/29/2017             Expected Discharge Plan:  Home/Self Care  In-House Referral:  NA  Discharge planning Services  NA  Post Acute Care Choice:  NA Choice offered to:  NA  DME Arranged:  N/A DME Agency:  NA  HH Arranged:  NA HH Agency:  NA  Status of Service:  Completed, signed off  If discussed at [place] of Stay Meetings, dates discussed:    Additional Comments:  Adron Bene, RN 07/29/2017, 2:49 PM

## 2017-07-29 NOTE — Op Note (Signed)
   PATIENT: [name]      MRN: [id number] DOB: 03/19/1961    DATE OF PROCEDURE: 07/29/2017  INDICATIONS:    [name] is a 56 y.o. male with a nonfunctioning right upper arm fistula.  He presents for fistulogram and possible venoplasty.  PROCEDURE:    1.  Ultrasound-guided access to right brachiocephalic AV fistula 2.  Fistulogram right brachiocephalic AV fistula 3.  Venoplasty of 2 tandem 90% stenoses of right brachiocephalic AV fistula  SURGEON: Christopher S. Dickson, MD, FACS  ANESTHESIA: Local with sedation  EBL: Minimal  TECHNIQUE: The patient was taken to the peripheral vascular lab and was sedated. The period of conscious sedation was 42 minutes.  During that time period, I was present face-to-face 100% of the time.  The patient was administered 1 mg of Versed and 50 mcg of fentanyl. The patient's heart rate, blood pressure, and oxygen saturation were monitored by the nurse continuously during the procedure.   The right upper extremity was prepped and draped in usual sterile fashion.  Under ultrasound guidance, after the skin was anesthetized, proximal fistula was cannulated with a micropuncture needle and a micropuncture sheath introduced over a wire.  Fistulogram was obtained to evaluate from the cannulation site to include the central veins.  There were 2 90% tandem stenoses which I felt could be addressed with venoplasty.  The patient was heparinized.  The micropuncture sheath was exchanged for a short 5 French over a versa core wire.  I selected a 6 mm x 68 mm balloon.  This was positioned across both of the stenoses separately and inflated to nominal pressure for 1 minute.  Completion film showed residual stenosis.  Therefore went up to an 8 mm x 60 mm balloon.  I had to upsize to a 6 French sheath and again performed venoplasty of both areas separate completing the balloon to nominal pressure for 1minute there was still some irregularity at the more  central stenosis and therefore I went back with an 8 mm x 60 mm drug-coated balloon.  I had to up size to a 7 French sheath.  The balloon was inflated to 12 atm for 2-1/2 minutes.  Completion film showed an excellent result with no significant residual stenosis.  There was an excellent thrill in the procedure at this point.  Of note, with the balloon inflated I did do a retrograde shot to evaluate the arterial anastomosis.  Cannulation site was closed with a 4-0 Monocryl.  Patient tolerated the procedure well and was transferred to the holding area.  No immediate complications were noted.   FINDINGS:   Patent right brachiocephalic AV fistula. No central venous stenosis. Widely patent arterial anastomosis. 2 tandem 90% stenoses which was successfully addressed with venoplasty as described above.  PRE: 90% to tandem stenoses POST: Less than 15% residual stenosis STENT: No  Christopher Dickson, MD, FACS [facility]  DATE OF DICTATION:   07/29/2017    

## 2017-07-29 NOTE — Progress Notes (Signed)
[facility] Progress Note  Subjective: constipated, no BM x 2 days  Vitals:   07/29/17 0901 07/29/17 0906 07/29/17 0911 07/29/17 1024  BP: 126/78 129/81 132/85 136/82  Pulse: 96 94 (!) 0 99  Resp: 11 11 (!) 0 18  Temp:    98.2 F (36.8 C)  TempSrc:    Oral  SpO2: 100% 100% (!) 0% 98%  Weight:      Height:        Inpatient medications: . [START ON 07/30/2017] darbepoetin (ARANESP) injection - DIALYSIS  60 mcg Intravenous Q Tue-HD  . doxercalciferol  1 mcg Intravenous Q T,Th,Sa-HD  . insulin aspart  0-5 Units Subcutaneous QHS  . insulin aspart  0-9 Units Subcutaneous TID WC  . insulin detemir  5 Units Subcutaneous Daily  . lanthanum  1,000 mg Oral TID WC  . multivitamin  1 tablet Oral QHS  . pantoprazole  40 mg Oral Daily  . polyethylene glycol  17 g Oral Daily  . warfarin  7.5 mg Oral ONCE-1800  . Warfarin - Pharmacist Dosing Inpatient   Does not apply q1800   . sodium chloride Stopped (07/27/17 1047)  . sodium chloride    . sodium chloride 10 mL/hr at 07/27/17 0933  . heparin 1,200 Units/hr (07/29/17 1308)   sodium chloride, sodium chloride, acetaminophen, alteplase, heparin, lidocaine (PF), lidocaine-prilocaine, menthol-cetylpyridinium, ondansetron, oxyCODONE-acetaminophen, pentafluoroprop-tetrafluoroeth, senna  Exam: Alert, no distress No jvd Chest cta bilat RRR no mrg Abd soft ntnd  EXt no LE edeam R AVF +bruit  Dialysis: [facility] 4h    92kg   2/2.5   Hep 6000   -hect 1 ug -no esa/ Fe -Auryxia 210 mg 2 tabs TID A/C -Sensipar 60 mg PO q evening        Impression: 1. Acute LowerGIB: Supra-therapeutic INR on admit requiringVit K. Was stable on conservative management but unfortunately rebled. Colonoscopy 11/21 showing internal hemorrhoids and one polyp.Awaiting therapeutic INR for DC 2. Dialysis Access dysfunction: See above. Near clotted AVF 11/24. VVS consulted.  SP new TDC Sat and fistulogram today with venoplasty of tandem 90%  stenoses of the R BC AVF.  3. HxDVT/PE: Coumadinheld, then resumed 4. ESRD: Usual TTS HD 5. Hypertension/volume:Was onLosartan 25 mg PO q day, amlodipine 5 mg PO dailybut now stopped.Not getting to OP EDW.Hyponatremia argues for volume overload  6. Anemia: Hgb9.3>>10.2. Continue Aranesp 42mcg weekly for now.Last dose 96/29/52. 7. Metabolic bone disease:  Continue Fosrenol and Hectoral, sensipar on hold d/t low Ca.  8. Nutrition: Alb low,Renal diet/fld restrictions. Renal vit/nepro. 9. DM:Per primary    Plan - HD tomorrow 1st shift   Kelly Splinter MD [place] pager [phone]   07/29/2017, 2:31 PM   Recent Labs  Lab 07/24/17 0725  07/26/17 0718 07/27/17 0845 07/29/17 0229  NA 129*   < > 130* 131* 130*  K 4.8   < > 4.5 4.7 4.5  CL 93*   < > 92* 95* 91*  CO2 22   < > 23 21* 23  GLUCOSE 141*   < > 243* 168* 216*  BUN 47*   < > 58* 66* 54*  CREATININE 9.35*   < > 10.92* 11.69* 10.74*  CALCIUM 7.8*   < > 8.0* 7.8* 8.5*  PHOS 6.3*  --   --  4.3  --    < > = values in this interval not displayed.   Recent Labs  Lab 07/24/17 0725 07/27/17 0845  ALBUMIN 3.4* 3.2*  Recent Labs  Lab 07/23/17 0542  07/26/17 0718 07/27/17 0410 07/28/17 0558 07/29/17 0229  WBC 11.2*   < > 11.3* 13.2* 18.8* 13.0*  NEUTROABS 7.5  --  7.0 8.1*  --   --   HGB 10.8*   < > 9.5* 9.3* 10.2* 10.0*  HCT 31.7*   < > 27.3* 26.8* 30.8* 29.1*  MCV 97.2   < > 95.8 97.1 98.7 98.3  PLT 361   < > 333 402* 285 260   < > = values in this interval not displayed.   Iron/TIBC/Ferritin/ %Sat    Component Value Date/Time   IRON 38 (L) 01/27/2015 0300   TIBC 185 (L) 01/27/2015 0300   FERRITIN 316 01/27/2015 0300   IRONPCTSAT 21 01/27/2015 0300

## 2017-07-30 ENCOUNTER — Inpatient Hospital Stay (HOSPITAL_COMMUNITY): Payer: Medicare Other

## 2017-07-30 ENCOUNTER — Telehealth: Payer: Self-pay | Admitting: Family Medicine

## 2017-07-30 LAB — CBC
HEMATOCRIT: 28.6 % — AB (ref 39.0–52.0)
Hemoglobin: 9.5 g/dL — ABNORMAL LOW (ref 13.0–17.0)
MCH: 32.8 pg (ref 26.0–34.0)
MCHC: 33.2 g/dL (ref 30.0–36.0)
MCV: 98.6 fL (ref 78.0–100.0)
Platelets: 291 10*3/uL (ref 150–400)
RBC: 2.9 MIL/uL — ABNORMAL LOW (ref 4.22–5.81)
RDW: 15.4 % (ref 11.5–15.5)
WBC: 12.1 10*3/uL — ABNORMAL HIGH (ref 4.0–10.5)

## 2017-07-30 LAB — RENAL FUNCTION PANEL
ALBUMIN: 3.2 g/dL — AB (ref 3.5–5.0)
Anion gap: 16 — ABNORMAL HIGH (ref 5–15)
BUN: 68 mg/dL — AB (ref 6–20)
CO2: 21 mmol/L — ABNORMAL LOW (ref 22–32)
Calcium: 8.1 mg/dL — ABNORMAL LOW (ref 8.9–10.3)
Chloride: 91 mmol/L — ABNORMAL LOW (ref 101–111)
Creatinine, Ser: 12.24 mg/dL — ABNORMAL HIGH (ref 0.61–1.24)
GFR calc Af Amer: 5 mL/min — ABNORMAL LOW (ref >60)
GFR calc non Af Amer: 4 mL/min — ABNORMAL LOW (ref >60)
GLUCOSE: 196 mg/dL — AB (ref 65–99)
POTASSIUM: 4.8 mmol/L (ref 3.5–5.1)
Phosphorus: 9 mg/dL — ABNORMAL HIGH (ref 2.5–4.6)
Sodium: 128 mmol/L — ABNORMAL LOW (ref 135–145)

## 2017-07-30 LAB — PROTIME-INR
INR: 1.49
Prothrombin Time: 17.9 seconds — ABNORMAL HIGH (ref 11.4–15.2)

## 2017-07-30 MED ORDER — SODIUM CHLORIDE 0.9 % IV SOLN: 100.0000 mL | INTRAVENOUS | Status: DC | PRN

## 2017-07-30 MED ORDER — LIDOCAINE-PRILOCAINE 2.5-2.5 % EX CREA: 1.0000 "application " | TOPICAL_CREAM | CUTANEOUS | Status: DC | PRN

## 2017-07-30 MED ORDER — OXYCODONE-ACETAMINOPHEN 5-325 MG PO TABS: 1.0000 | ORAL_TABLET | ORAL | 0 refills | Status: AC | PRN

## 2017-07-30 MED ORDER — HEPARIN SODIUM (PORCINE) 1000 UNIT/ML DIALYSIS: 1000.0000 [IU] | INTRAMUSCULAR | Status: DC | PRN

## 2017-07-30 MED ORDER — LIDOCAINE HCL (PF) 1 % IJ SOLN: 5.0000 mL | INTRAMUSCULAR | Status: DC | PRN

## 2017-07-30 MED ORDER — HEPARIN SODIUM (PORCINE) 1000 UNIT/ML DIALYSIS: 3000.0000 [IU] | Freq: Once | INTRAMUSCULAR | Status: DC

## 2017-07-30 MED ORDER — DARBEPOETIN ALFA 60 MCG/0.3ML IJ SOSY
PREFILLED_SYRINGE | INTRAMUSCULAR | Status: AC
  Filled 2017-07-30: qty 0.3

## 2017-07-30 MED ORDER — ENOXAPARIN SODIUM 100 MG/ML ~~LOC~~ SOLN: 1.0000 mg/kg | SUBCUTANEOUS | 0 refills | Status: DC

## 2017-07-30 MED ORDER — ALTEPLASE 2 MG IJ SOLR: 2.0000 mg | Freq: Once | INTRAMUSCULAR | Status: DC | PRN

## 2017-07-30 MED ORDER — WARFARIN SODIUM 7.5 MG PO TABS: 7.5000 mg | ORAL_TABLET | Freq: Once | ORAL | Status: DC

## 2017-07-30 MED ORDER — DOXERCALCIFEROL 4 MCG/2ML IV SOLN
INTRAVENOUS | Status: AC
  Filled 2017-07-30: qty 2

## 2017-07-30 MED ORDER — PENTAFLUOROPROP-TETRAFLUOROETH EX AERO: 1.0000 "application " | INHALATION_SPRAY | CUTANEOUS | Status: DC | PRN

## 2017-07-30 MED ORDER — SORBITOL 70 % PO SOLN: 15.0000 mL | Freq: Every day | ORAL | 0 refills | Status: DC | PRN

## 2017-07-30 NOTE — Progress Notes (Addendum)
Patient with c/o chest pain  ,not related to  HD cath site but holding HD cath. Paged on call  Hs coverage   ,131/72 p 98  96 r/a sats  resp 17. Obtained EKG   And  Gave report to  HD nurse. Previously received percocet for  Arm/cathsite discomfort.

## 2017-07-30 NOTE — Progress Notes (Signed)
Allergies as of 07/30/2017      Reactions   No Known Allergies       Medication List    STOP taking these medications   polyethylene glycol packet Commonly known as:  MIRALAX / GLYCOLAX     TAKE these medications   acetaminophen 325 MG tablet Commonly known as:  TYLENOL Take 2 tablets (650 mg total) by mouth every 6 (six) hours as needed for mild pain or moderate pain (or Fever >/= 101).   amLODipine 5 MG tablet Commonly known as:  NORVASC Take 1 tablet (5 mg total) by mouth daily.   cinacalcet 60 MG tablet Commonly known as:  SENSIPAR Take 60 mg by mouth daily.   enoxaparin 100 MG/ML injection Commonly known as:  LOVENOX Inject 0.95 mLs (95 mg total) into the skin daily.   glipiZIDE 5 MG tablet Commonly known as:  GLUCOTROL Take 1 tablet (5 mg total) by mouth 2 (two) times daily before a meal. What changed:  when to take this   lanthanum 1000 MG chewable tablet Commonly known as:  FOSRENOL Chew 1,000 mg by mouth 3 (three) times daily with meals. Reported on 12/21/2015   losartan 25 MG tablet Commonly known as:  COZAAR Take 25 mg by mouth at bedtime. Reported on 12/21/2015   multivitamin Tabs tablet Take 1 tablet by mouth at bedtime.   omeprazole 20 MG capsule Commonly known as:  PRILOSEC Take 1 capsule (20 mg total) by mouth daily.   oxyCODONE-acetaminophen 5-325 MG tablet Commonly known as:  PERCOCET/ROXICET Take 1 tablet by mouth every 4 (four) hours as needed for moderate pain.   senna 8.6 MG Tabs tablet Commonly known as:  SENOKOT Take 2 tablets (17.2 mg total) by mouth at bedtime as needed for mild constipation or moderate constipation.   sorbitol 70 % solution Take 15 mLs by mouth daily as needed.   warfarin 5 MG tablet Commonly known as:  COUMADIN Take as directed. If you are unsure how to take this medication, talk to your nurse or doctor. Original instructions:  Take 1 tablet every day as directed by Coumadin Clinic What changed:    how much  to take  how to take this  when to take this  additional instructions     Pt discharge to home, discharge instructions, medications/prescriptions and f/u appt discussed and reviewed with pt, verbalized understanding. IV dc'ed, site clean and dry. Pt was escorted out of the unit in wheelchair, took all belongings with him.

## 2017-07-30 NOTE — Clinical Social Work Note (Cosign Needed)
Pt medical stable and ready for d/c. Pt requested a bus pass for transport. CSW administrated bus pass to RN to give to pt upon d/c. CSW will follow up.    Massie Kluver, [place] intern

## 2017-07-30 NOTE — Telephone Encounter (Signed)
Pt. Called to let Erline Levine know that he was in the hospital for two weeks.

## 2017-07-30 NOTE — Telephone Encounter (Addendum)
Called patient and he reports that he is feeling well and getting ready to pick up his Lovenox injections. Patient did not have a Coumadin Clinic visit scheduled prior to discharge. Asked that patient come to see me tomorrow but he has to be in court so appt scheduled for Thursday at 2pm for INR check. Patient to call with any concern between now and then.

## 2017-07-30 NOTE — Progress Notes (Signed)
ANTICOAGULATION CONSULT NOTE - Follow Up Consult  Pharmacy Consult for Lovenox and warfarin  Indication: PE / DVT history  Allergies  Allergen Reactions  . No Known Allergies     Patient Measurements: Height: 5\' 11"  (180.3 cm) Weight: 214 lb 11.7 oz (97.4 kg)(Standing Weight) IBW/kg (Calculated) : 75.3 Heparin Dosing Weight: 94 kg  Vital Signs: Temp: 97.8 F (36.6 C) (11/27 0700) Temp Source: Oral (11/27 0700) BP: 94/65 (11/27 0745) Pulse Rate: 101 (11/27 0745)  Labs: Recent Labs    07/27/17 0845 07/28/17 0034  07/28/17 0558 07/28/17 0948 07/29/17 0229 07/30/17 0523 07/30/17 0708  HGB  --   --    < > 10.2*  --  10.0* 9.5*  --   HCT  --   --   --  30.8*  --  29.1* 28.6*  --   PLT  --   --   --  285  --  260 291  --   LABPROT  --   --   --  17.3*  --  18.1* 17.9*  --   INR  --   --   --  1.43  --  1.51 1.49  --   HEPARINUNFRC  --  0.83*  --   --  0.47 0.38  --   --   CREATININE 11.69*  --   --   --   --  10.74*  --  12.24*   < > = values in this interval not displayed.    Estimated Creatinine Clearance: 8 mL/min (A) (by C-G formula based on SCr of 12.24 mg/dL (H)).  Assessment: 56 year old male to on warfarin PTA for hx of DVT/PE. Warfarin held due to fistulogram that occurred on 11/26. Post procedure pt transitioned to Lovenox and warfarin resumed 11/26 pm.    INR 1.49 after warfarin resumed last evening. CBC stable. Remains on Lovenox bridge until INR therapeutic.   Goal of Therapy:  INR 2 - 3 Monitor platelets   Plan:  -Continue Lovenox 95 mg (1 mg/kg) subcutaneously every 24 hours -Warfarin 7.5 mg x 1 this evening  -Daily INR and CBC  -If home today would resume home dose of warfarin with close follow up at [facility], PharmD, BCPS 07/30/2017, 7:51 AM

## 2017-07-30 NOTE — Discharge Summary (Signed)
Physician Discharge Summary  [name] ELF:[id number] DOB: 1961-02-15 DOA: 07/15/2017  PCP: Arnoldo Morale, MD  Admit date: 07/15/2017 Discharge date: 07/30/2017  Time spent: 35 minutes  Recommendations for Outpatient Follow-up:  1. Will need Lovenox-->couamdin overlap-have prescribed on discharge Lovenox 7 syringes in addition to regular dose of Coumadin and expect he will need an INR check in about 4-5 days to ensure that his INR is therapeutic 2. Needs CBC plus differential in about 1 week as well as renal panel 3. Will need Aranesp and iron as per nephrology at dialysis 4. Right upper extremity fistula was operated on at this admission and deferred to nephrology when to start use of the same--will need removal of [facility] as per nephrology and vascular   Discharge Diagnoses:  Active Problems:   Acute GI bleeding   Hyperkalemia   Discharge Condition: Improved  Diet recommendation: Renal heart healthy  Filed Weights   07/28/17 2126 07/30/17 0700 07/30/17 1003  Weight: 94.4 kg (208 lb 1.8 oz) 97.4 kg (214 lb 11.7 oz) 94.7 kg (208 lb 12.4 oz)    History of present illness:  56 year old male [place] S/p atherectomyright upper extremity fistula with successful use of the same Diastolic heart failure last EF 04/04/2017 normal RV function EF 55%?  Underwent Lexiscan 10/12/2016 EF 41% no ST segment deviation and it was a low risk study mass right atrium possible dialysis hip with adherent thrombus Cocaine HTN Metabolic bone disease Previous homelessness Elevated troponin without CAD-probably secondary to strain pattern from recent PE Diabetes mellitus type TY 2 Prior Acute PE Right fem DVT   Nephrology consulted for dialysis needs. Resume Coumadin with IV heparin bridging. DC home when INR therapeuticABOVE 2.     Hospital Course:  Acute lower GI bleeding 2/2 hemorrhoids vs semi-pedunculated polyp on colonoscopy 11.21 -In the  setting of elevated INR 4.13 on presentation -heparin changed to lovenox, if able to give himself and has had documented stabiltiy without GI bleeds, maybe home am? - resume coumadin with overlap of Lovenox as his INR was 1.4 on discharge needs outpatient GI follow-up   Mild leukocytosis Stabilized and nonacute  Non-working fistula RUE Appreciate Vasc Dr. Krystal Clark input [facility] placed L chest 11/24 venorepair done 11/26--an use maybe access in 1 week-defer to nephrology Needs removal TDC once RUE access working again  Nausea + vomit Unclear casue Adding zofran 4 tid--monitor as still having issue Axr done 11/25 no blockage and is passing gas Seem overall improved and resolved during hospital stay  Subtherapeutic INR -coumadin on hold as needing varied surgeries--resumed 11/26 post-op  ESRD on HD TTH SAT -HD as per renal  Hyponatremia, possibly dilutional -improving -euvolemic -na+ 132 from 129 -BMP am  HTN- -losartan  Type 2 DM  -glipizide his home medication was on Levemir and sliding scale coverage during hospital stay  Resumed on glipizide on discharge  Anemia of chronic disease -Additional component 2/2 ESRD -no futher bleed -hg 11.2-->9.5 on discharge -CBC am -Continue Aranesp 60 Q Monday    Procedures: Colonoscopy 11/21 showed erythematous 12 mm polyp removed by polypectomy   Consultations:  Gastroenterology  Renal  Discharge Exam: Vitals:   07/30/17 0930 07/30/17 1003  BP: (!) 86/61 111/71  Pulse: (!) 115 (!) 106  Resp: 15 16  Temp:  97.9 F (36.6 C)  SpO2:  96%   Awake alert in good spirits no other issues General: No issues seen on dialysis tolerating diet asking for laxative Cardiovascular: S1-S2 no murmur  rub or gallop Respiratory: Clinically clear no added sound  Discharge Instructions   Discharge Instructions    AMB Referral to [place] Management   Complete by:  As directed    Please assign to [place] for  transition of care. Written consent obtained. Current address is [address] [place], [place], [place] [address]. Goes to HD on Tuesday, Thursday, Saturday. Currently at [facility]. Please call with questions. THanks. Marthenia Rolling, [place], [facility] Liaison-[phone]   Reason for consult:  Please assign to [facility] Telephonic RNCM   Diagnoses of:   Kidney Failure Diabetes     Expected date of contact:  1-3 days (reserved for hospital discharges)   AMB Referral to [place] Management   Complete by:  As directed    Please assign to [place] for transition of care. Has HF, ESRD, and DM. Has had extended hospitalization. Current address is [address] [address]. Please assign [facility] LCSW per patient request for assistance with finding housing and community resources. States his current living situation is temporary. Was previously assigned to [facility].Marthenia Rolling, [place], [facility] IDPOEUM-[phone]   Reason for consult:  Please assign to [facility] RNCM and [facility] LCSW   Diagnoses of:   Kidney Failure Diabetes Heart Failure     Expected date of contact:  1-3 days (reserved for hospital discharges)     Current Discharge Medication List    START taking these medications   Details  enoxaparin (LOVENOX) 100 MG/ML injection Inject 0.95 mLs (95 mg total) into the skin daily. Qty: 7 Syringe, Refills: 0    oxyCODONE-acetaminophen (PERCOCET/ROXICET) 5-325 MG tablet Take 1 tablet by mouth every 4 (four) hours as needed for moderate pain. Qty: 5 tablet, Refills: 0    sorbitol 70 % solution Take 15 mLs by mouth daily as needed. Qty: 473 mL, Refills: 0      CONTINUE these medications which have NOT CHANGED   Details  acetaminophen (TYLENOL) 325 MG tablet Take 2 tablets (650 mg total) by mouth every 6 (six) hours as needed for mild pain or moderate pain (or Fever >/= 101).    amLODipine (NORVASC) 5 MG tablet  Take 1 tablet (5 mg total) by mouth daily. Qty: 30 tablet, Refills: 0   Associated Diagnoses: Essential hypertension    cinacalcet (SENSIPAR) 60 MG tablet Take 60 mg by mouth daily.    glipiZIDE (GLUCOTROL) 5 MG tablet Take 1 tablet (5 mg total) by mouth 2 (two) times daily before a meal. Qty: 60 tablet, Refills: 0    lanthanum (FOSRENOL) 1000 MG chewable tablet Chew 1,000 mg by mouth 3 (three) times daily with meals. Reported on 12/21/2015    losartan (COZAAR) 25 MG tablet Take 25 mg by mouth at bedtime. Reported on 12/21/2015    multivitamin (RENA-VIT) TABS tablet Take 1 tablet by mouth at bedtime. Qty: 30 tablet, Refills: 0    omeprazole (PRILOSEC) 20 MG capsule Take 1 capsule (20 mg total) by mouth daily. Qty: 30 capsule, Refills: 0   Associated Diagnoses: Gastroesophageal reflux disease, esophagitis presence not specified    senna (SENOKOT) 8.6 MG TABS tablet Take 2 tablets (17.2 mg total) by mouth at bedtime as needed for mild constipation or moderate constipation. Qty: 30 each, Refills: 0    warfarin (COUMADIN) 5 MG tablet Take 1 tablet every day as directed by Coumadin Clinic Qty: 30 tablet, Refills: 2   Associated Diagnoses: Acute  deep vein thrombosis (DVT) of femoral vein of right lower extremity ([place]); Other acute pulmonary embolism without acute cor pulmonale (HCC)      STOP taking these medications     polyethylene glycol (MIRALAX / GLYCOLAX) packet        Allergies  Allergen Reactions  . No Known Allergies    Follow-up Information    Brahmbhatt, Parag, MD. Schedule an appointment as soon as possible for a visit in 4 week(s).   Specialty:  Gastroenterology Contact information: [place] [address] [phone]            The results of significant diagnostics from this hospitalization (including imaging, microbiology, ancillary and laboratory) are listed below for reference.    Significant Diagnostic Studies: Ct Abdomen  Pelvis Wo Contrast  Result Date: 07/15/2017 CLINICAL DATA:  56 year old hypertensive diabetic male on dialysis presenting with bright red blood per rectum. On Coumadin. Prior appendectomy. Initial encounter. EXAM: CT ABDOMEN AND PELVIS WITHOUT CONTRAST TECHNIQUE: Multidetector CT imaging of the abdomen and pelvis was performed following the standard protocol without IV contrast. COMPARISON:  None. FINDINGS: Lower chest: No worrisome lung base abnormality. Coronary artery calcifications. Heart size within normal limits. Hepatobiliary: Minimally lobulated contour without other findings of cirrhosis. Taking into account limitation by non contrast imaging, no worrisome mass. Prominent size gallbladder or possibly with sludge but without calcified gallstone. Pancreas: Taking into account limitation by non contrast imaging, no pancreatic mass or inflammation Spleen: Taking into account limitation by non contrast imaging, no mass or enlargement Adrenals/Urinary Tract: No obstructing stone or hydronephrosis. Taking into account limitation by non contrast imaging, no renal or adrenal mass. Noncontrast filled urinary bladder without abnormality. Stomach/Bowel: Portions of colon under distended. No extraluminal bowel inflammatory process, free fluid or free air. Vascular/Lymphatic: Aortic calcification without aneurysm. Aortic branch vessel calcifications. Scattered normal size lymph nodes including mild porta hepatis lymph nodes and pelvic lymph nodes without adenopathy. Reproductive: Coarse calcifications prostate gland. Partial calcification seminal vesicles with asymmetric enlargement right seminal vesicle of indeterminate etiology. Other: Fat and vessel containing inguinal hernias. Musculoskeletal: Degenerative changes lumbar spine most notable L2-3 through L5-S1. No osseous destructive lesion. IMPRESSION: Evaluation of segments of bowel limited by under distension however, no extraluminal bowel inflammatory process,  free fluid or free air. Aortic Atherosclerosis (ICD10-I70.0). Coronary artery calcifications. Enlarged right seminal vesicle of indeterminate etiology. Fat and vessel containing inguinal hernias. Degenerative changes L2-3 through L5-S1. Prominent size gallbladder without calcified gallstones. Atrophic kidneys. Electronically Signed   By: Genia Del M.D.   On: 07/15/2017 16:24   Dg Chest Port 1 View  Result Date: 07/27/2017 CLINICAL DATA:  Dialysis catheter insertion. EXAM: PORTABLE CHEST 1 VIEW COMPARISON:  04/02/2017 FINDINGS: Patient has a left jugular dialysis catheter. Catheter tip in the lower SVC. Low lung volumes. Few patchy densities along the left costophrenic angle. Negative for a pneumothorax. Heart size is enlarged but accentuated by the low lung volumes. IMPRESSION: Dialysis catheter tip in the SVC.  Negative for pneumothorax. Low lung volumes with few densities near the left costophrenic angle. Findings could represent atelectasis or small effusion. Electronically Signed   By: Markus Daft M.D.   On: 07/27/2017 11:34   Dg Abd 2 Views  Result Date: 07/28/2017 CLINICAL DATA:  Patient with generalized abdominal pain and vomiting. EXAM: ABDOMEN - 2 VIEW COMPARISON:  CT abdomen pelvis 07/15/2017 FINDINGS: Oral contrast material within the colon. Gas is demonstrated within nondilated loops of large and small bowel in  a nonobstructed pattern. No free intraperitoneal air. Lumbar spine degenerative changes. IMPRESSION: Nonobstructed bowel gas pattern. Electronically Signed   By: Lovey Newcomer M.D.   On: 07/28/2017 14:30   Dg Fluoro Guide Cv Line-no Report  Result Date: 07/27/2017 Fluoroscopy was utilized by the requesting physician.  No radiographic interpretation.    Microbiology: No results found for this or any previous visit (from the past 240 hour(s)).   Labs: Basic Metabolic Panel: Recent Labs  Lab 07/24/17 0725  07/25/17 0305 07/26/17 5809 07/27/17 0845 07/29/17 0229  07/30/17 0708  NA 129*   < > 130* 130* 131* 130* 128*  K 4.8   < > 4.4 4.5 4.7 4.5 4.8  CL 93*  --  92* 92* 95* 91* 91*  CO2 22  --  25 23 21* 23 21*  GLUCOSE 141*   < > 176* 243* 168* 216* 196*  BUN 47*  --  40* 58* 66* 54* 68*  CREATININE 9.35*  --  8.22* 10.92* 11.69* 10.74* 12.24*  CALCIUM 7.8*  --  7.9* 8.0* 7.8* 8.5* 8.1*  PHOS 6.3*  --   --   --  4.3  --  9.0*   < > = values in this interval not displayed.   Liver Function Tests: Recent Labs  Lab 07/24/17 0725 07/27/17 0845 07/30/17 0708  ALBUMIN 3.4* 3.2* 3.2*   No results for input(s): LIPASE, AMYLASE in the last 168 hours. No results for input(s): AMMONIA in the last 168 hours. CBC: Recent Labs  Lab 07/26/17 0718 07/27/17 0410 07/28/17 0558 07/29/17 0229 07/30/17 0523  WBC 11.3* 13.2* 18.8* 13.0* 12.1*  NEUTROABS 7.0 8.1*  --   --   --   HGB 9.5* 9.3* 10.2* 10.0* 9.5*  HCT 27.3* 26.8* 30.8* 29.1* 28.6*  MCV 95.8 97.1 98.7 98.3 98.6  PLT 333 402* 285 260 291   Cardiac Enzymes: No results for input(s): CKTOTAL, CKMB, CKMBINDEX, TROPONINI in the last 168 hours. BNP: BNP (last 3 results) Recent Labs    09/29/16 0820 04/02/17 0200  BNP 503.1* 324.6*    ProBNP (last 3 results) No results for input(s): PROBNP in the last 8760 hours.  CBG: Recent Labs  Lab 07/28/17 2121 07/29/17 0727 07/29/17 1222 07/29/17 1659 07/29/17 2103  GLUCAP 350* 146* 176* 141* 227*       Signed:  Nita Sells MD   [facility] 07/30/2017, 10:38 AM

## 2017-07-31 ENCOUNTER — Ambulatory Visit: Payer: Self-pay | Admitting: Family Medicine

## 2017-07-31 ENCOUNTER — Other Ambulatory Visit: Payer: Self-pay | Admitting: *Deleted

## 2017-07-31 NOTE — Patient Outreach (Signed)
[facility]) Care Management  [birth year]  KAITLIN ARDITO [birth year] [id number]   Referral received from hospital liaison for transition of care as member was recently admitted to hospital from 11/12-11/27 for hyperkalemia (requiring dialysis, new to patient) and GI bleed.  Per chart, he also has history of hypertension, heart failure, GERD, and diabetes.  Call placed to member, no answer.  HIPAA compliant voice message left.  Will await call back, if no call back, will make 2nd attempt tomorrow.    Update @ 1550:  Call received back from member.  Identity confirmed.  This care manager introduces self and purpose of call.  [facility] care management services explained, he is familiar with services as he has been active in the past.  He report he is doing "much better" since his discharge.  He state he is compliant with medications.  Discusses having to go to clinic for Coumadin check as his levels are being monitored for therapeutic range.  State he is also taking Lovenox injections until coumadin therapeutic (education provided in hospital).  Denies concerns.    He denies any bleeding since discharge.  State he already has his follow up appointment with the [facility].  He is new to dialysis, will have treatments Tue/Thur/Sat.  Report he will be using [facility] for all appointments.  He denies any urgent concerns at this time, agrees to home visit next week.  THN CM Care Plan Problem One     Most Recent Value  Care Plan Problem One  Risk for reamdission related to GI bleed/hyperkalemia as evidenced by recent admission  Role Documenting the Problem One  Care Management [place] for Problem One  Active  [facility] Long Term Goal   Member will not be readmitted to hospital within 31 days of discharge  [facility] Long Term Goal Start Date  07/31/17  Interventions for Problem One Long Term Goal  Discussed with member the importance of following discharge instructions, including  follow up appointments, medications, diet, to decrease the risk of readmission  [facility] CM Short Term Goal #1   Member will report taking all medications as instructed (with understanding) over the next 4 weeks  Interventions for Short Term Goal #1  Attempted to review medications with member, he request to wait until home visit, state he has all medications  [facility] CM Short Term Goal #2   Member will keep and attend follow up appointment with primary MD within the next 2 weeks  [facility] CM Short Term Goal #2 Start Date  07/31/17  Interventions for Short Term Goal #2  Confirmed member has appointment scheduled, offered assistance with transportation, declines     Valente David, MSN, RN [place] Manager [phone]

## 2017-08-01 ENCOUNTER — Ambulatory Visit (HOSPITAL_BASED_OUTPATIENT_CLINIC_OR_DEPARTMENT_OTHER): Payer: Medicare Other | Admitting: Pharmacist

## 2017-08-01 ENCOUNTER — Other Ambulatory Visit: Payer: Self-pay | Admitting: Family Medicine

## 2017-08-01 DIAGNOSIS — Z5181 Encounter for therapeutic drug level monitoring: Secondary | ICD-10-CM | POA: Insufficient documentation

## 2017-08-01 DIAGNOSIS — Z86718 Personal history of other venous thrombosis and embolism: Secondary | ICD-10-CM | POA: Insufficient documentation

## 2017-08-01 DIAGNOSIS — Z7901 Long term (current) use of anticoagulants: Secondary | ICD-10-CM | POA: Insufficient documentation

## 2017-08-01 DIAGNOSIS — Z86711 Personal history of pulmonary embolism: Secondary | ICD-10-CM | POA: Insufficient documentation

## 2017-08-01 DIAGNOSIS — Z992 Dependence on renal dialysis: Secondary | ICD-10-CM

## 2017-08-01 DIAGNOSIS — N2581 Secondary hyperparathyroidism of renal origin: Secondary | ICD-10-CM | POA: Diagnosis not present

## 2017-08-01 DIAGNOSIS — N186 End stage renal disease: Secondary | ICD-10-CM | POA: Diagnosis not present

## 2017-08-01 DIAGNOSIS — I82411 Acute embolism and thrombosis of right femoral vein: Secondary | ICD-10-CM | POA: Diagnosis not present

## 2017-08-01 DIAGNOSIS — I43 Cardiomyopathy in diseases classified elsewhere: Secondary | ICD-10-CM | POA: Diagnosis not present

## 2017-08-01 DIAGNOSIS — K625 Hemorrhage of anus and rectum: Secondary | ICD-10-CM | POA: Diagnosis not present

## 2017-08-01 DIAGNOSIS — K921 Melena: Secondary | ICD-10-CM | POA: Diagnosis not present

## 2017-08-01 DIAGNOSIS — I208 Other forms of angina pectoris: Secondary | ICD-10-CM

## 2017-08-01 DIAGNOSIS — I5032 Chronic diastolic (congestive) heart failure: Secondary | ICD-10-CM | POA: Diagnosis not present

## 2017-08-01 DIAGNOSIS — E8770 Fluid overload, unspecified: Secondary | ICD-10-CM | POA: Diagnosis not present

## 2017-08-01 DIAGNOSIS — I10 Essential (primary) hypertension: Secondary | ICD-10-CM

## 2017-08-01 DIAGNOSIS — I132 Hypertensive heart and chronic kidney disease with heart failure and with stage 5 chronic kidney disease, or end stage renal disease: Secondary | ICD-10-CM | POA: Diagnosis not present

## 2017-08-01 DIAGNOSIS — K648 Other hemorrhoids: Secondary | ICD-10-CM | POA: Diagnosis not present

## 2017-08-01 LAB — POCT INR: INR: 2.7

## 2017-08-01 MED ORDER — ACCU-CHEK AVIVA PLUS W/DEVICE KIT: PACK | 0 refills | Status: AC

## 2017-08-01 MED ORDER — GLUCOSE BLOOD VI STRP: ORAL_STRIP | 12 refills | Status: AC

## 2017-08-01 MED ORDER — ACCU-CHEK SOFT TOUCH LANCETS MISC: 12 refills | Status: AC

## 2017-08-01 NOTE — Progress Notes (Signed)
    Pharmacy Anticoagulation Clinic  Subjective: Patient presents today for INR monitoring. Anticoagulation indication is PE/DVT (July 2018). The projected end date is February 2019.   Current dose of warfarin: takes 5 mg daily.  SOB/leg swelling or redness, chest pain: denies S/sx of bleeding: denies  Reports fatigue from having dialysis today. He feels dizzy.  Recently admitted to the hospital with GI bleed in the setting of INR of 4. Patient is currently being bridged with Lovenox (hospital was aware patient is on dialysis). Denies any bleeding or issues since discharge.   Objective: Today's INR = 2.7  Lab Results  Component Value Date   INR 1.49 07/30/2017   INR 1.51 07/29/2017   INR 1.43 07/28/2017     Assessment and Plan: Anticoagulation: INR currently therapeutic at 2.7 with goal INR 2-3. Patient has had a large increase in INR in two days 1.49 -> 2.7 so will decrease to 0.5 tablets today and then start the following: warfarin 5 mg daily but 2.5 mg on Tuesday and Thursday. Patient will continue Lovenox and return tomorrow for INR check and if >2, will plan to d/c Lovenox.   Blood pressure was good today. He reports that he has felt like this after dialysis session. Patient given a Sprite and protein bar. Unable to check glucose because all lancet devices were out and there was no way to collect a sample and his INR testing site had already clotted. Did order a glucometer for patient to test at home. Patient instructed to go to ED if symptoms worsen.  Patient verbalized understanding and was provided with written instructions. Patient to report to ED or call 911 with any leg swelling, leg erythema, SOB, chest pain, falls, or s/sx of bleeding. Patient seen with [place], PharmD Candidate

## 2017-08-02 ENCOUNTER — Encounter (HOSPITAL_COMMUNITY): Payer: Self-pay | Admitting: *Deleted

## 2017-08-02 ENCOUNTER — Inpatient Hospital Stay (HOSPITAL_COMMUNITY)
Admission: EM | Admit: 2017-08-02 | Discharge: 2017-08-04 | DRG: 393 | Disposition: A | Discharge status: Home / Self Care | Payer: Medicare Other | Attending: Family Medicine | Admitting: Family Medicine

## 2017-08-02 ENCOUNTER — Telehealth: Payer: Self-pay | Admitting: Family Medicine

## 2017-08-02 DIAGNOSIS — I5032 Chronic diastolic (congestive) heart failure: Secondary | ICD-10-CM | POA: Diagnosis present

## 2017-08-02 DIAGNOSIS — N186 End stage renal disease: Secondary | ICD-10-CM | POA: Diagnosis present

## 2017-08-02 DIAGNOSIS — Z9862 Peripheral vascular angioplasty status: Secondary | ICD-10-CM | POA: Diagnosis not present

## 2017-08-02 DIAGNOSIS — K921 Melena: Secondary | ICD-10-CM | POA: Diagnosis present

## 2017-08-02 DIAGNOSIS — Z89412 Acquired absence of left great toe: Secondary | ICD-10-CM | POA: Diagnosis not present

## 2017-08-02 DIAGNOSIS — I132 Hypertensive heart and chronic kidney disease with heart failure and with stage 5 chronic kidney disease, or end stage renal disease: Secondary | ICD-10-CM | POA: Diagnosis present

## 2017-08-02 DIAGNOSIS — K625 Hemorrhage of anus and rectum: Secondary | ICD-10-CM

## 2017-08-02 DIAGNOSIS — D631 Anemia in chronic kidney disease: Secondary | ICD-10-CM | POA: Diagnosis present

## 2017-08-02 DIAGNOSIS — N2581 Secondary hyperparathyroidism of renal origin: Secondary | ICD-10-CM | POA: Diagnosis present

## 2017-08-02 DIAGNOSIS — E1151 Type 2 diabetes mellitus with diabetic peripheral angiopathy without gangrene: Secondary | ICD-10-CM | POA: Diagnosis present

## 2017-08-02 DIAGNOSIS — I120 Hypertensive chronic kidney disease with stage 5 chronic kidney disease or end stage renal disease: Secondary | ICD-10-CM | POA: Diagnosis not present

## 2017-08-02 DIAGNOSIS — Z7901 Long term (current) use of anticoagulants: Secondary | ICD-10-CM

## 2017-08-02 DIAGNOSIS — Z79899 Other long term (current) drug therapy: Secondary | ICD-10-CM

## 2017-08-02 DIAGNOSIS — Z7984 Long term (current) use of oral hypoglycemic drugs: Secondary | ICD-10-CM | POA: Diagnosis not present

## 2017-08-02 DIAGNOSIS — Z992 Dependence on renal dialysis: Secondary | ICD-10-CM | POA: Diagnosis not present

## 2017-08-02 DIAGNOSIS — E1122 Type 2 diabetes mellitus with diabetic chronic kidney disease: Secondary | ICD-10-CM | POA: Diagnosis present

## 2017-08-02 DIAGNOSIS — Z86711 Personal history of pulmonary embolism: Secondary | ICD-10-CM

## 2017-08-02 DIAGNOSIS — I43 Cardiomyopathy in diseases classified elsewhere: Secondary | ICD-10-CM | POA: Diagnosis present

## 2017-08-02 DIAGNOSIS — D500 Iron deficiency anemia secondary to blood loss (chronic): Secondary | ICD-10-CM | POA: Diagnosis present

## 2017-08-02 DIAGNOSIS — K648 Other hemorrhoids: Principal | ICD-10-CM | POA: Diagnosis present

## 2017-08-02 DIAGNOSIS — D649 Anemia, unspecified: Secondary | ICD-10-CM | POA: Diagnosis present

## 2017-08-02 DIAGNOSIS — Z86718 Personal history of other venous thrombosis and embolism: Secondary | ICD-10-CM | POA: Diagnosis not present

## 2017-08-02 DIAGNOSIS — I10 Essential (primary) hypertension: Secondary | ICD-10-CM

## 2017-08-02 DIAGNOSIS — Z5181 Encounter for therapeutic drug level monitoring: Secondary | ICD-10-CM

## 2017-08-02 DIAGNOSIS — E8889 Other specified metabolic disorders: Secondary | ICD-10-CM | POA: Diagnosis present

## 2017-08-02 DIAGNOSIS — K5900 Constipation, unspecified: Secondary | ICD-10-CM | POA: Diagnosis present

## 2017-08-02 DIAGNOSIS — K219 Gastro-esophageal reflux disease without esophagitis: Secondary | ICD-10-CM | POA: Diagnosis present

## 2017-08-02 LAB — COMPREHENSIVE METABOLIC PANEL
ALBUMIN: 3.6 g/dL (ref 3.5–5.0)
ALT: 21 U/L (ref 17–63)
ANION GAP: 16 — AB (ref 5–15)
AST: 28 U/L (ref 15–41)
Alkaline Phosphatase: 84 U/L (ref 38–126)
BILIRUBIN TOTAL: 0.5 mg/dL (ref 0.3–1.2)
BUN: 39 mg/dL — ABNORMAL HIGH (ref 6–20)
CALCIUM: 7.7 mg/dL — AB (ref 8.9–10.3)
CO2: 22 mmol/L (ref 22–32)
Chloride: 93 mmol/L — ABNORMAL LOW (ref 101–111)
Creatinine, Ser: 8.72 mg/dL — ABNORMAL HIGH (ref 0.61–1.24)
GFR calc non Af Amer: 6 mL/min — ABNORMAL LOW (ref >60)
GFR, EST AFRICAN AMERICAN: 7 mL/min — AB (ref >60)
GLUCOSE: 207 mg/dL — AB (ref 65–99)
POTASSIUM: 4.3 mmol/L (ref 3.5–5.1)
SODIUM: 131 mmol/L — AB (ref 135–145)
TOTAL PROTEIN: 7.8 g/dL (ref 6.5–8.1)

## 2017-08-02 LAB — TYPE AND SCREEN
ABO/RH(D): O POS
ANTIBODY SCREEN: NEGATIVE

## 2017-08-02 LAB — CBC
HEMATOCRIT: 30.7 % — AB (ref 39.0–52.0)
HEMOGLOBIN: 10 g/dL — AB (ref 13.0–17.0)
MCH: 33.2 pg (ref 26.0–34.0)
MCHC: 32.6 g/dL (ref 30.0–36.0)
MCV: 102 fL — ABNORMAL HIGH (ref 78.0–100.0)
Platelets: 288 10*3/uL (ref 150–400)
RBC: 3.01 MIL/uL — AB (ref 4.22–5.81)
RDW: 16.5 % — ABNORMAL HIGH (ref 11.5–15.5)
WBC: 12.1 10*3/uL — ABNORMAL HIGH (ref 4.0–10.5)

## 2017-08-02 LAB — PROTIME-INR
INR: 2.2
PROTHROMBIN TIME: 24.2 s — AB (ref 11.4–15.2)

## 2017-08-02 LAB — GLUCOSE, CAPILLARY: Glucose-Capillary: 312 mg/dL — ABNORMAL HIGH (ref 65–99)

## 2017-08-02 LAB — POC OCCULT BLOOD, ED: FECAL OCCULT BLD: POSITIVE — AB

## 2017-08-02 MED ORDER — SENNA 8.6 MG PO TABS
2.0000 | ORAL_TABLET | Freq: Every day | ORAL | Status: DC | PRN
  Administered 2017-08-03: 17.2 mg via ORAL
  Filled 2017-08-02: qty 2

## 2017-08-02 MED ORDER — INSULIN ASPART 100 UNIT/ML ~~LOC~~ SOLN
0.0000 [IU] | Freq: Three times a day (TID) | SUBCUTANEOUS | Status: DC
  Administered 2017-08-02 – 2017-08-03 (×2): 7 [IU] via SUBCUTANEOUS
  Administered 2017-08-04: 2 [IU] via SUBCUTANEOUS
  Administered 2017-08-04: 5 [IU] via SUBCUTANEOUS

## 2017-08-02 MED ORDER — PANTOPRAZOLE SODIUM 40 MG PO TBEC
40.0000 mg | DELAYED_RELEASE_TABLET | Freq: Every day | ORAL | Status: DC
  Administered 2017-08-02 – 2017-08-04 (×3): 40 mg via ORAL
  Filled 2017-08-02 (×3): qty 1

## 2017-08-02 MED ORDER — AMLODIPINE BESYLATE 5 MG PO TABS
5.0000 mg | ORAL_TABLET | Freq: Every day | ORAL | Status: DC
  Administered 2017-08-02 – 2017-08-03 (×2): 5 mg via ORAL
  Filled 2017-08-02 (×3): qty 1

## 2017-08-02 MED ORDER — FERRIC CITRATE 1 GM 210 MG(FE) PO TABS
210.0000 mg | ORAL_TABLET | Freq: Three times a day (TID) | ORAL | Status: DC
  Administered 2017-08-02 – 2017-08-04 (×4): 210 mg via ORAL
  Filled 2017-08-02 (×6): qty 1
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
## 2017-08-02 NOTE — Progress Notes (Signed)
Family Medicine Teaching Service Daily Progress Note Intern Pager: [phone]  Patient name: [name] Medical record number: [id number] Date of birth: 1961-03-20 Age: 56 y.o. Gender: male  Primary Care Provider: Arnoldo Morale, MD Consultants: Gastroenterology, Nephrology Code Status: Full  Pt Overview and Major Events to Date:  11/30 admitted with symptomatic anemia, GI bleed  Assessment and Plan: [name] is a 56 y.o. male presenting with painful rectal bleeding. PMH is significant for HTN, ESRD on HD, HFpEF, T2DM, GERD, Hemorrhoids, Hx of PE and DVT, Hx of cocaine abuse, Hx of ETOH abuse.  Symptomatic Anemia 2/2 GI bleed: Hgb stable at 10.0 on admit. Has known GI bleed from hemorrhoids seen on colonoscopy from previous admission on 07/15/17 in the setting of anticoagulation. - monitor CBC  - monitor on telemetry  Painful GI Bleed 2/2 hemorrhoids: stable. Rectal exam today no gross red blood but very tender. - GI consulted, appreciate recommendations. Agreeable to restarting anticoag if no continued GI bleed and Hgb stable.  - monitor CBC - NPO in case of procedure, if no intervention then will restart renal carb modified diet  Long Term Anticoagulation. On warfarin/lovenox for provoked PE in July 2018 on concomitant DVT requiring long term anticoagulation, projected end Feb 2019 per PCP and cardiology notes. Had started bridging day prior to admission.  - monitor INR - holding lovenox and warfarin in the setting of GI bleed, will restart coumadin pending Hgb recheck later today  T2DM: HgA1c of 7.8 on 04/03/2017. - sSSI with no night time Lantus - Monitor CBGs  HTN: stable, BP 99/55 this am - Continue to monitor  - Cont home Amlodipine 5mg  daily  ESRD: HD on TTS, euvolemic on exam, stable. - monitor renal function panel - nephrology following for HD, appreciate recommendations  HFpEF: Stable, not in acute exacerbation. Last Echo 02/22/2017 EF  55-60% with hypokinesis of basal-inferior myocardium with mild LV wall thickness (grade 1 diastolic dysfunction). - Consider ACEI/ARB if BP elevated  - monitor fluid status  FEN/GI: NPO, protonix Prophylaxis: SCDs  Disposition: pending GI recs  Subjective:  Continues to have large bright red bloody bowel movements. No bleeding when not having BM. No CP, SOB, diaphoresis, weakness  Objective: Temp:  [98.1 F (36.7 C)-98.8 F (37.1 C)] 98.6 F (37 C) (12/01 0600) Pulse Rate:  [89-115] 89 (12/01 0600) Resp:  [11-20] 20 (12/01 0600) BP: (99-155)/(55-100) 99/55 (12/01 0600) SpO2:  [97 %-100 %] 99 % (12/01 0600) Weight:  [208 lb 15.9 oz (94.8 kg)] 208 lb 15.9 oz (94.8 kg) (11/30 1900) Physical Exam: General: well appearing, sitting up in bed comfortably, in NAD Cardiovascular: RRR, no murmurs Respiratory: CTAB, NWOB on room air Abdomen: soft, nontender, nondistended, + bowel sounds Rectal: no external hemorrhoids or fissure. Very tender to palpation of internal sphincter, no gross red blood, soft brown stool in vault Extremities: warm and well perfused, no edema  Laboratory: Recent Labs  Lab 07/29/17 0229 07/30/17 0523 08/02/17 1141  WBC 13.0* 12.1* 12.1*  HGB 10.0* 9.5* 10.0*  HCT 29.1* 28.6* 30.7*  PLT 260 291 288   Recent Labs  Lab 07/29/17 0229 07/30/17 0708 08/02/17 1141  NA 130* 128* 131*  K 4.5 4.8 4.3  CL 91* 91* 93*  CO2 23 21* 22  BUN 54* 68* 39*  CREATININE 10.74* 12.24* 8.72*  CALCIUM 8.5* 8.1* 7.7*  PROT  --   --  7.8  BILITOT  --   --  0.5  ALKPHOS  --   --  84  ALT  --   --  21  AST  --   --  28  GLUCOSE 216* 196* 207*    11/30- INR 2.2  Imaging/Diagnostic Tests: No results found.  Bufford Lope, DO 08/03/2017, 9:05 AM PGY-2, [place] Intern pager: [phone], text pages welcome
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
## 2017-08-02 NOTE — Telephone Encounter (Signed)
Pt called to speak with the clinical pharmacy, please call him back

## 2017-08-02 NOTE — ED Notes (Signed)
ED Provider at bedside. 

## 2017-08-02 NOTE — Telephone Encounter (Signed)
Returned patient call. He reports that he had bright red blood in his stool this morning and that he has a lot of abdominal pain. He tried to call [facility] but had to leave a message. Instructed patient to go to the emergency room or call 911 if he felt like he could not drive. He had an INR of 2.7 yesterday but with recent hospitalization for bleeding, he needs to be evaluated. Patient reported that he would get dressed and go to the emergency room.

## 2017-08-02 NOTE — ED Notes (Signed)
Family at bedside. 

## 2017-08-02 NOTE — ED Notes (Signed)
No answer for vitals recheck

## 2017-08-02 NOTE — Telephone Encounter (Signed)
Noted  

## 2017-08-02 NOTE — ED Triage Notes (Signed)
Pt reports bright red rectal bleeding that started yesterday. Recent admission for same on 11/12. Pt is on warfarin. Appears pale at triage.

## 2017-08-03 ENCOUNTER — Other Ambulatory Visit: Payer: Self-pay

## 2017-08-03 DIAGNOSIS — K625 Hemorrhage of anus and rectum: Secondary | ICD-10-CM

## 2017-08-03 LAB — COMPREHENSIVE METABOLIC PANEL
ALBUMIN: 3.2 g/dL — AB (ref 3.5–5.0)
ALK PHOS: 88 U/L (ref 38–126)
ALT: 20 U/L (ref 17–63)
AST: 22 U/L (ref 15–41)
Anion gap: 14 (ref 5–15)
BILIRUBIN TOTAL: 0.5 mg/dL (ref 0.3–1.2)
BUN: 53 mg/dL — AB (ref 6–20)
CALCIUM: 7.3 mg/dL — AB (ref 8.9–10.3)
CO2: 22 mmol/L (ref 22–32)
CREATININE: 10.6 mg/dL — AB (ref 0.61–1.24)
Chloride: 93 mmol/L — ABNORMAL LOW (ref 101–111)
GFR calc Af Amer: 5 mL/min — ABNORMAL LOW (ref >60)
GFR calc non Af Amer: 5 mL/min — ABNORMAL LOW (ref >60)
GLUCOSE: 178 mg/dL — AB (ref 65–99)
Potassium: 4.2 mmol/L (ref 3.5–5.1)
SODIUM: 129 mmol/L — AB (ref 135–145)
TOTAL PROTEIN: 7 g/dL (ref 6.5–8.1)

## 2017-08-03 LAB — CBC
HCT: 26.6 % — ABNORMAL LOW (ref 39.0–52.0)
Hemoglobin: 8.9 g/dL — ABNORMAL LOW (ref 13.0–17.0)
MCH: 33.1 pg (ref 26.0–34.0)
MCHC: 33.5 g/dL (ref 30.0–36.0)
MCV: 98.9 fL (ref 78.0–100.0)
PLATELETS: 310 10*3/uL (ref 150–400)
RBC: 2.69 MIL/uL — ABNORMAL LOW (ref 4.22–5.81)
RDW: 16.2 % — AB (ref 11.5–15.5)
WBC: 10.3 10*3/uL (ref 4.0–10.5)

## 2017-08-03 LAB — GLUCOSE, CAPILLARY
GLUCOSE-CAPILLARY: 333 mg/dL — AB (ref 65–99)
GLUCOSE-CAPILLARY: 319 mg/dL — AB (ref 65–99)
Glucose-Capillary: 139 mg/dL — ABNORMAL HIGH (ref 65–99)
Glucose-Capillary: 184 mg/dL — ABNORMAL HIGH (ref 65–99)

## 2017-08-03 LAB — MAGNESIUM: Magnesium: 2.4 mg/dL (ref 1.7–2.4)

## 2017-08-03 LAB — PHOSPHORUS: Phosphorus: 5 mg/dL — ABNORMAL HIGH (ref 2.5–4.6)

## 2017-08-03 LAB — PROTIME-INR
INR: 2.16
PROTHROMBIN TIME: 23.9 s — AB (ref 11.4–15.2)

## 2017-08-03 MED ORDER — DOCUSATE SODIUM 100 MG PO CAPS: 100.0000 mg | ORAL_CAPSULE | Freq: Two times a day (BID) | ORAL | Status: DC | PRN

## 2017-08-03 MED ORDER — DARBEPOETIN ALFA 60 MCG/0.3ML IJ SOSY: 60.0000 ug | PREFILLED_SYRINGE | INTRAMUSCULAR | Status: DC

## 2017-08-03 MED ORDER — DOCUSATE SODIUM 100 MG PO CAPS
100.0000 mg | ORAL_CAPSULE | Freq: Two times a day (BID) | ORAL | Status: DC
  Administered 2017-08-03 – 2017-08-04 (×3): 100 mg via ORAL
  Filled 2017-08-03 (×3): qty 1

## 2017-08-03 MED ORDER — DOXERCALCIFEROL 4 MCG/2ML IV SOLN: 1.0000 ug | INTRAVENOUS | Status: DC

## 2017-08-03 MED ORDER — HYDROCORTISONE ACETATE 25 MG RE SUPP
25.0000 mg | Freq: Two times a day (BID) | RECTAL | Status: DC
  Administered 2017-08-03 – 2017-08-04 (×3): 25 mg via RECTAL
  Filled 2017-08-03 (×3): qty 1

## 2017-08-03 MED ORDER — WARFARIN - PHYSICIAN DOSING INPATIENT: Freq: Every day | Status: DC

## 2017-08-03 MED ORDER — POLYETHYLENE GLYCOL 3350 17 G PO PACK
17.0000 g | PACK | Freq: Every day | ORAL | Status: DC
  Administered 2017-08-03 – 2017-08-04 (×2): 17 g via ORAL
  Filled 2017-08-03 (×2): qty 1

## 2017-08-03 MED ORDER — RENA-VITE PO TABS
1.0000 | ORAL_TABLET | Freq: Every day | ORAL | Status: DC
  Administered 2017-08-03: 1 via ORAL
  Filled 2017-08-03: qty 1

## 2017-08-03 MED ORDER — WARFARIN SODIUM 5 MG PO TABS
5.0000 mg | ORAL_TABLET | Freq: Every day | ORAL | Status: DC
  Administered 2017-08-03: 5 mg via ORAL
  Filled 2017-08-03: qty 1

## 2017-08-03 NOTE — Progress Notes (Signed)
[facility] Progress Note  FREDERIC TONES 56 y.o. 15-Nov-1960  CC:  Rectal bleeding  Subjective: Patient is known to [place] from recent admission. Patient with the recurrent lower GI bleed in setting of anticoagulation. He was admitted to the hospital 2 weeks ago with similar complaints. Underwent colonoscopy on 07/24/2017 by Dr. Therisa Doyne  which showed 1 adenomatous polyp and internal hemorrhoids. No evidence of active bleeding.   According to patient, after going home he started feeling constipated and then again started having rectal bleeding with each bowel movements. Occasional rectal pain during defecation which he attributes to constipation. Denied any other GI complaints.  ROS : Negative for chest pain or shortness of breath. Negative for weakness. Negative for suicidal ideation and hallucinations   Objective: Vital signs in last 24 hours: Vitals:   08/03/17 0600 08/03/17 1000  BP: (!) 99/55 131/78  Pulse: 89 88  Resp: 20 20  Temp: 98.6 F (37 C) 98.3 F (36.8 C)  SpO2: 99% 98%    Physical Exam:  General:  Alert, cooperative, no distress, appears stated age  Head:  Normocephalic, without obvious abnormality, atraumatic  Eyes:  , EOM's intact,   Lungs:   Clear to auscultation bilaterally, respirations unlabored  Heart:  Regular rate and rhythm, S1, S2 normal  Abdomen:   Soft, non-tender, bowel sounds active all four quadrants,  no masses,   Extremities: Extremities normal, atraumatic, no  edema  Pulses: 2+ and symmetric    Lab Results: Recent Labs    08/02/17 1141  NA 131*  K 4.3  CL 93*  CO2 22  GLUCOSE 207*  BUN 39*  CREATININE 8.72*  CALCIUM 7.7*   Recent Labs    08/02/17 1141  AST 28  ALT 21  ALKPHOS 84  BILITOT 0.5  PROT 7.8  ALBUMIN 3.6   Recent Labs    08/02/17 1141  WBC 12.1*  HGB 10.0*  HCT 30.7*  MCV 102.0*  PLT 288   Recent Labs    08/01/17 1347 08/02/17 1714  LABPROT  --  24.2*  INR 2.7 2.20       Assessment/Plan: -Recurrent/intermittent Rectal bleeding in setting of use of anticoagulation. Patient had 2 colonoscopy this year showed no evidence of active bleeding. Last colonoscopy was in 07/24/2017 -Chronic anemia - Constipation - anticoagulation usewithCoumadin. -End-stage renal disease. On hemodialysis.  Recommendations ------------------------- - Patient's hemoglobin is stable compared to prior to admission. - Start MiraLAX for constipation. Start Anusol suppository for internal hemorrhoids. - Okay to resume anticoagulation from GI standpoint. - Advance diet - GI will follow.- Hopefully discharge soon if no evidence of further bleeding.   Otis Brace MD, [place] 08/03/2017, 10:43 AM  Contact #  [phone]

## 2017-08-03 NOTE — Procedures (Signed)
   I was present at this dialysis session, have reviewed the session itself and made  appropriate changes Kelly Splinter MD [place] pager [phone]   08/03/2017, 2:13 PM
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
## 2017-08-03 NOTE — Discharge Summary (Signed)
[facility] Discharge Summary  Patient name: [name] Medical record number: [id number] Date of birth: 12-14-1960 Age: 56 y.o. Gender: male Date of Admission: 08/02/2017  Date of Discharge: 08/04/17  Admitting Physician: Lind Covert, MD  Primary Care Provider: Arnoldo Morale, MD Consultants: Gastroenterology, Nephrology  Indication for Hospitalization: Symptomatic anemia  Discharge Diagnoses/Problem List:  Symptomatic anemia 2/2 GI bleed Hemorrhoids Long term anticoagulation for PE/DVT T2DM HTN ESRD HFpEF  Disposition: Home  Discharge Condition: Stable, improved  Discharge Exam:  General: well appearing, sitting up in bed comfortably, in NAD Cardiovascular: RRR, no murmurs Respiratory: CTAB, NWOB on room air Abdomen: soft, nontender, nondistended, + bowel sounds Extremities: warm and well perfused, no ed  Brief Hospital Course:  [name] is a 56 y.o. male presenting with bright red blood in his stool and painful defecation. He was recently admitted for a GI bleed and was found to have hemorrhoids and a polyp on colonoscopy. Had some SOB and fatigue since being discharged with dizziness and sweating but no LOC and no syncopal episodes thought to be symptomatic anemia although his Hgb on admission was 10, at baseline. Observed for two night and Hgb recheck x 2 - at 8.9 and then 9.8. GI stated given patient with 2 colonoscopies this year with no evidence of active bleed no further workup was needed in the hospital. They recommended miralax, anusol for hemorrhoids and stated okay to resume anticoagulation. Since his INR was therapeutic on admission, resumed warfarin. Patient counseled on supportive care for hemorrhoids and constipation at home.  At discharge patient denied any shortness of breath palpitations dizziness with walking or standing up, states he is ready for discharge.  INR was noted to be 1.8, which is  slightly below therapeutic level.  Discussed with patient and he plans to follow-up with the community health and wellness on Monday for INR recheck.   Issues for Follow Up:  1. Recheck INR 2. Discussed patient's bowel movements make sure he is having soft bowel movements 3. Make sure patient is following up with GI as an outpatient  Significant Procedures: none  Significant Labs and Imaging:  Recent Labs  Lab 08/02/17 1141 08/03/17 1249 08/04/17 0416  WBC 12.1* 10.3 11.0*  HGB 10.0* 8.9* 9.8*  HCT 30.7* 26.6* 29.8*  PLT 288 310 318   Recent Labs  Lab 07/29/17 0229 07/30/17 0708 08/02/17 1141 08/03/17 1249 08/04/17 0416  NA 130* 128* 131* 129* 130*  K 4.5 4.8 4.3 4.2 4.5  CL 91* 91* 93* 93* 93*  CO2 23 21* 22 22 21*  GLUCOSE 216* 196* 207* 178* 192*  BUN 54* 68* 39* 53* 41*  CREATININE 10.74* 12.24* 8.72* 10.60* 8.34*  CALCIUM 8.5* 8.1* 7.7* 7.3* 8.3*  MG  --   --   --  2.4  --   PHOS  --  9.0*  --  5.0* 5.2*  ALKPHOS  --   --  84 88  --   AST  --   --  28 22  --   ALT  --   --  21 20  --   ALBUMIN  --  3.2* 3.6 3.2* 3.5     Results/Tests Pending at Time of Discharge: None   Discharge Medications:  Allergies as of 08/04/2017      Reactions   No Known Allergies       Medication List    STOP taking these medications   acetaminophen 325 MG tablet Commonly  known as:  TYLENOL   enoxaparin 100 MG/ML injection Commonly known as:  LOVENOX   sorbitol 70 % solution     TAKE these medications   ACCU-CHEK AVIVA PLUS w/Device Kit Use once daily as directed to check blood sugar. E11.9   accu-chek soft touch lancets Use once daily as directed to check blood sugar. E11.9   amLODipine 5 MG tablet Commonly known as:  NORVASC TAKE 1 TABLET (5 MG TOTAL) BY MOUTH DAILY. What changed:  when to take this   cinacalcet 60 MG tablet Commonly known as:  SENSIPAR Take 60 mg by mouth daily after supper.   DIALYVITE 800/ZINC PO Take 1 tablet by mouth daily after  supper.   docusate sodium 100 MG capsule Commonly known as:  COLACE Take 2 capsules (200 mg total) by mouth 2 (two) times daily.   ferric citrate 1 GM 210 MG(Fe) tablet Commonly known as:  AURYXIA Take 210 mg by mouth 3 (three) times daily with meals.   glipiZIDE 5 MG tablet Commonly known as:  GLUCOTROL Take 1 tablet (5 mg total) by mouth 2 (two) times daily before a meal. What changed:  when to take this   glucose blood test strip Commonly known as:  ACCU-CHEK AVIVA PLUS Use once daily as directed to check blood sugar. E11.9   hydrocortisone 25 MG suppository Commonly known as:  ANUSOL-HC Place 1 suppository (25 mg total) rectally 2 (two) times daily.   multivitamin Tabs tablet Take 1 tablet by mouth at bedtime.   omeprazole 20 MG capsule Commonly known as:  PRILOSEC Take 1 capsule (20 mg total) by mouth daily. What changed:  when to take this   oxyCODONE-acetaminophen 5-325 MG tablet Commonly known as:  PERCOCET/ROXICET Take 1 tablet by mouth every 4 (four) hours as needed for moderate pain.   polyethylene glycol packet Commonly known as:  MIRALAX / GLYCOLAX Take 17 g by mouth 2 (two) times daily.   senna 8.6 MG Tabs tablet Commonly known as:  SENOKOT Take 2 tablets (17.2 mg total) by mouth daily as needed for mild constipation. What changed:    when to take this  reasons to take this   warfarin 5 MG tablet Commonly known as:  COUMADIN Take as directed. If you are unsure how to take this medication, talk to your nurse or doctor. Original instructions:  Take 1 tablet every day as directed by Coumadin Clinic What changed:    how much to take  how to take this  when to take this  additional instructions       Discharge Instructions: Please refer to Patient Instructions section of EMR for full details.  Patient was counseled important signs and symptoms that should prompt return to medical care, changes in medications, dietary instructions, activity  restrictions, and follow up appointments.   Follow-Up Appointments: Follow-up Information    Arnoldo Morale, MD. Call in 1 day(s).   Specialty:  Family Medicine Contact information: [place] [address] [phone]           Tonette Bihari, MD 08/04/2017, 12:31 PM PGY-3, [place]

## 2017-08-03 NOTE — Progress Notes (Signed)
Arrival Method: Patient arrived in stretcher from ED. Mental Orientation: alert Telemetry:21 Assessment: See Doc Flow sheets. Skin: Warm, dry and intact. IV: Peripheral I Pain:.denies Fall Prevention Safety Plan: Patient educated about fall prevention safety plan, understood and acknowledged. Admission Screening:5100 Orientation: Patient has been oriented to the unit, staff and to the room.

## 2017-08-04 LAB — CBC
HEMATOCRIT: 29.8 % — AB (ref 39.0–52.0)
Hemoglobin: 9.8 g/dL — ABNORMAL LOW (ref 13.0–17.0)
MCH: 32.9 pg (ref 26.0–34.0)
MCHC: 32.9 g/dL (ref 30.0–36.0)
MCV: 100 fL (ref 78.0–100.0)
PLATELETS: 318 10*3/uL (ref 150–400)
RBC: 2.98 MIL/uL — ABNORMAL LOW (ref 4.22–5.81)
RDW: 16.6 % — AB (ref 11.5–15.5)
WBC: 11 10*3/uL — ABNORMAL HIGH (ref 4.0–10.5)

## 2017-08-04 LAB — GLUCOSE, CAPILLARY
GLUCOSE-CAPILLARY: 188 mg/dL — AB (ref 65–99)
GLUCOSE-CAPILLARY: 183 mg/dL — AB (ref 65–99)
Glucose-Capillary: 206 mg/dL — ABNORMAL HIGH (ref 65–99)
Glucose-Capillary: 346 mg/dL — ABNORMAL HIGH (ref 65–99)

## 2017-08-04 LAB — RENAL FUNCTION PANEL
ALBUMIN: 3.5 g/dL (ref 3.5–5.0)
Anion gap: 16 — ABNORMAL HIGH (ref 5–15)
BUN: 41 mg/dL — AB (ref 6–20)
CHLORIDE: 93 mmol/L — AB (ref 101–111)
CO2: 21 mmol/L — ABNORMAL LOW (ref 22–32)
CREATININE: 8.34 mg/dL — AB (ref 0.61–1.24)
Calcium: 8.3 mg/dL — ABNORMAL LOW (ref 8.9–10.3)
GFR calc Af Amer: 7 mL/min — ABNORMAL LOW (ref >60)
GFR calc non Af Amer: 6 mL/min — ABNORMAL LOW (ref >60)
GLUCOSE: 192 mg/dL — AB (ref 65–99)
POTASSIUM: 4.5 mmol/L (ref 3.5–5.1)
Phosphorus: 5.2 mg/dL — ABNORMAL HIGH (ref 2.5–4.6)
Sodium: 130 mmol/L — ABNORMAL LOW (ref 135–145)

## 2017-08-04 LAB — PROTIME-INR
INR: 1.8
Prothrombin Time: 20.7 seconds — ABNORMAL HIGH (ref 11.4–15.2)

## 2017-08-04 LAB — HEMOGLOBIN A1C
HEMOGLOBIN A1C: 8.6 % — AB (ref 4.8–5.6)
MEAN PLASMA GLUCOSE: 200 mg/dL

## 2017-08-04 MED ORDER — DOCUSATE SODIUM 100 MG PO CAPS: 200.0000 mg | ORAL_CAPSULE | Freq: Two times a day (BID) | ORAL | 0 refills | Status: AC

## 2017-08-04 MED ORDER — SENNA 8.6 MG PO TABS: 2.0000 | ORAL_TABLET | Freq: Every day | ORAL | 0 refills | Status: DC | PRN

## 2017-08-04 MED ORDER — HYDROCORTISONE ACETATE 25 MG RE SUPP: 25.0000 mg | Freq: Two times a day (BID) | RECTAL | 0 refills | Status: AC

## 2017-08-04 MED ORDER — POLYETHYLENE GLYCOL 3350 17 G PO PACK: 17.0000 g | PACK | Freq: Two times a day (BID) | ORAL | 0 refills | Status: AC

## 2017-08-04 MED ORDER — POLYETHYLENE GLYCOL 3350 17 G PO PACK: 17.0000 g | PACK | Freq: Two times a day (BID) | ORAL | Status: DC

## 2017-08-04 NOTE — Discharge Instructions (Signed)
You were admitted to the hospital for rectal bleeding.  Gastroenterology saw you and stated that you needed better control of your hemorrhoids.  Please continue the Anusol once in the morning once at night to help with the pain from your hemorrhoids and to get them under control.  You also need to have soft bowel movements in order to not irritate your hemorrhoids.  We are prescribing you several medications to help with this.  Specifically you need to take Colace once in the morning and once at night you need to take MiraLAX once in the morning once at night.  If you are having soft bowel movements that is fine, if not you can also add on a as needed Senokot.  Please restart your warfarin.  Please make sure to follow-up with community health and wellness tomorrow to get your INR rechecked.  Continue your warfarin as you were taking it previously.

## 2017-08-04 NOTE — Progress Notes (Signed)
[facility] Gastroenterology Progress Note  [name] 56 y.o. [birth year]  CC:  Rectal bleeding  Subjective:  No further bleeding episodes. Had normal bowel movement this morning. Denied abdominal pain, nausea or vomiting. C/O  hard stools.  ROS : Negative for chest pain or shortness of breath. Negative for weakness. Negative for suicidal ideation and hallucinations   Objective: Vital signs in last 24 hours: Vitals:   08/03/17 2201 08/04/17 0431  BP: 133/85 117/74  Pulse: 100 92  Resp: 17 18  Temp: 97.6 F (36.4 C) 98.3 F (36.8 C)  SpO2: 96% 98%    Physical Exam:  General:  Alert, cooperative, no distress, appears stated age  Head:  Normocephalic, without obvious abnormality, atraumatic  Eyes:  , EOM's intact,   Lungs:   Clear to auscultation bilaterally, respirations unlabored  Heart:  Regular rate and rhythm, S1, S2 normal  Abdomen:   Soft, non-tender, bowel sounds active all four quadrants,  no masses,   Extremities: Extremities normal, atraumatic, no  edema  Pulses: 2+ and symmetric    Lab Results: Recent Labs    08/03/17 1249 08/04/17 0416  NA 129* 130*  K 4.2 4.5  CL 93* 93*  CO2 22 21*  GLUCOSE 178* 192*  BUN 53* 41*  CREATININE 10.60* 8.34*  CALCIUM 7.3* 8.3*  MG 2.4  --   PHOS 5.0* 5.2*   Recent Labs    08/02/17 1141 08/03/17 1249 08/04/17 0416  AST 28 22  --   ALT 21 20  --   ALKPHOS 84 88  --   BILITOT 0.5 0.5  --   PROT 7.8 7.0  --   ALBUMIN 3.6 3.2* 3.5   Recent Labs    08/03/17 1249 08/04/17 0416  WBC 10.3 11.0*  HGB 8.9* 9.8*  HCT 26.6* 29.8*  MCV 98.9 100.0  PLT 310 318   Recent Labs    08/03/17 1041 08/04/17 0416  LABPROT 23.9* 20.7*  INR 2.16 1.80      Assessment/Plan: -Recurrent/intermittent Rectal bleeding in setting of use of anticoagulation. Patient had 2 colonoscopy this year showed no evidence of active bleeding. Last colonoscopy was in 07/24/2017 -Chronic anemia - Constipation - anticoagulation  usewithCoumadin. -End-stage renal disease. On hemodialysis.  Recommendations ------------------------- - increase MiraLAX to twice a day. Continue Anusol  - Okay for anticoagulation from GI standpoint. - GI will sign off. Call us back if needed - follow-up in GI clinic in 4 weeks.   Otis Brace MD, [place] 08/04/2017, 10:14 AM  Contact #  [phone]

## 2017-08-04 NOTE — Progress Notes (Signed)
[facility] Progress Note   Dialysis Orders: T,Th,S [place]  4 hrs 180 NRe 400/800 92 kg 2.0 K/ 2.5 Ca  -Heparin tight ordered after last d/c but no heparin given -Hectorol 1 mcg IV TIW Mircera 60 q 2 weeks - not given yet   Assessment/Plan: 1. Rectal bleeding with hx GIB - miralax started for painful for stools, hemorroids 2. ESRD -  TTS- HD today; recent intervention - ok to use access a week - per d/c summary - use next HD 3. Hypertension/volume  - BP ok net UF 2.8 12/1 with post wt 92.5 4. Anemia  - hgb 8.8 up to 9.8  - resume Aranesp 60 per week 5. Metabolic bone disease -  Aurixya 2 tid ac and sensipar 60 q d P ok 6. Nutrition - renal diet/vitamin 7.   Disp - hgb is stable - ok per renal for d/c 8.   Chronic coumadin - bilateral LE, fem DVT 03/2017 - per primary/pharm  Myriam Jacobson, PA-C [place] [phone] 08/04/2017,11:14 AM  LOS: 2 days   Pt seen, examined and agree w A/P as above.  Kelly Splinter MD [facility] pager [phone]   08/04/2017, 2:35 PM    Subjective:   No more rectal bleeding- tells me he is being d/c this pm  Objective Vitals:   08/03/17 1704 08/03/17 2201 08/04/17 0431 08/04/17 1000  BP: 110/68 133/85 117/74 105/75  Pulse: (!) 115 100 92 93  Resp: 18 17 18 18   Temp: 98.4 F (36.9 C) 97.6 F (36.4 C) 98.3 F (36.8 C) 98.5 F (36.9 C)  TempSrc: Oral Oral Oral Oral  SpO2: 98% 96% 98% 99%  Weight:  92.9 kg (204 lb 11.9 oz)     Physical Exam General: NAD Heart: RRR Lungs: no rales Abdomen: soft NT Extremities: no LE edema Dialysis Access:  Right upper AVF + bruit and left IJ   Additional Objective Labs: Basic Metabolic Panel: Recent Labs  Lab 07/30/17 0708 08/02/17 1141 08/03/17 1249 08/04/17 0416  NA 128* 131* 129* 130*  K 4.8 4.3 4.2 4.5  CL 91* 93* 93* 93*  CO2 21* 22 22 21*  GLUCOSE 196* 207* 178* 192*  BUN 68* 39* 53* 41*  CREATININE 12.24* 8.72* 10.60* 8.34*   CALCIUM 8.1* 7.7* 7.3* 8.3*  PHOS 9.0*  --  5.0* 5.2*   Liver Function Tests: Recent Labs  Lab 08/02/17 1141 08/03/17 1249 08/04/17 0416  AST 28 22  --   ALT 21 20  --   ALKPHOS 84 88  --   BILITOT 0.5 0.5  --   PROT 7.8 7.0  --   ALBUMIN 3.6 3.2* 3.5   No results for input(s): LIPASE, AMYLASE in the last 168 hours. CBC: Recent Labs  Lab 07/29/17 0229 07/30/17 0523 08/02/17 1141 08/03/17 1249 08/04/17 0416  WBC 13.0* 12.1* 12.1* 10.3 11.0*  HGB 10.0* 9.5* 10.0* 8.9* 9.8*  HCT 29.1* 28.6* 30.7* 26.6* 29.8*  MCV 98.3 98.6 102.0* 98.9 100.0  PLT 260 291 288 310 318   Blood Culture    Component Value Date/Time   SDES BLOOD LEFT ANTECUBITAL 09/29/2016 0950   SPECREQUEST BOTTLES DRAWN AEROBIC AND ANAEROBIC  5CC 09/29/2016 0950   CULT NO GROWTH 5 DAYS 09/29/2016 0950   REPTSTATUS 10/04/2016 FINAL 09/29/2016 0950    Cardiac Enzymes: No results for input(s): CKTOTAL, CKMB, CKMBINDEX, TROPONINI in the last 168 hours. CBG: Recent Labs  Lab 08/03/17 1638 08/03/17 2154 08/04/17 0429  08/04/17 0616 08/04/17 0729  GLUCAP 333* 319* 206* 188* 183*   Iron Studies: No results for input(s): IRON, TIBC, TRANSFERRIN, FERRITIN in the last 72 hours. Lab Results  Component Value Date   INR 1.80 08/04/2017   INR 2.16 08/03/2017   INR 2.20 08/02/2017   Studies/Results: No results found. Medications:  . amLODipine  5 mg Oral QPC supper  . darbepoetin (ARANESP) injection - DIALYSIS  60 mcg Intravenous Q Sat-HD  . docusate sodium  100 mg Oral BID  . doxercalciferol  1 mcg Intravenous Q T,Th,Sa-HD  . ferric citrate  210 mg Oral TID WC  . hydrocortisone  25 mg Rectal BID  . insulin aspart  0-9 Units Subcutaneous TID WC  . multivitamin  1 tablet Oral QHS  . pantoprazole  40 mg Oral Daily  . polyethylene glycol  17 g Oral BID  . warfarin  5 mg Oral q1800  . Warfarin - Physician Dosing Inpatient   Does not apply [phone]

## 2017-08-05 ENCOUNTER — Encounter: Payer: Self-pay | Admitting: *Deleted

## 2017-08-05 ENCOUNTER — Other Ambulatory Visit: Payer: Self-pay | Admitting: *Deleted

## 2017-08-05 ENCOUNTER — Ambulatory Visit: Payer: Medicare Other | Attending: Family Medicine | Admitting: Pharmacist

## 2017-08-05 ENCOUNTER — Telehealth: Payer: Self-pay

## 2017-08-05 DIAGNOSIS — I82411 Acute embolism and thrombosis of right femoral vein: Secondary | ICD-10-CM | POA: Diagnosis not present

## 2017-08-05 DIAGNOSIS — K219 Gastro-esophageal reflux disease without esophagitis: Secondary | ICD-10-CM | POA: Diagnosis not present

## 2017-08-05 DIAGNOSIS — Z7901 Long term (current) use of anticoagulants: Secondary | ICD-10-CM | POA: Diagnosis not present

## 2017-08-05 DIAGNOSIS — Z79899 Other long term (current) drug therapy: Secondary | ICD-10-CM | POA: Diagnosis not present

## 2017-08-05 DIAGNOSIS — I82401 Acute embolism and thrombosis of unspecified deep veins of right lower extremity: Secondary | ICD-10-CM | POA: Diagnosis not present

## 2017-08-05 DIAGNOSIS — I2699 Other pulmonary embolism without acute cor pulmonale: Secondary | ICD-10-CM | POA: Diagnosis not present

## 2017-08-05 LAB — POCT INR: INR: 2

## 2017-08-05 MED ORDER — WARFARIN SODIUM 5 MG PO TABS: ORAL_TABLET | ORAL | 2 refills | Status: AC

## 2017-08-05 MED ORDER — SENNA 8.6 MG PO TABS: 2.0000 | ORAL_TABLET | Freq: Every day | ORAL | 0 refills | Status: AC | PRN

## 2017-08-05 MED ORDER — OMEPRAZOLE 20 MG PO CPDR: 20.0000 mg | DELAYED_RELEASE_CAPSULE | Freq: Every day | ORAL | 0 refills | Status: AC

## 2017-08-05 NOTE — Patient Outreach (Signed)
[facility]) Care Management   86/03/6719  FATIH STALVEY [birth year] [id number]  [name] is an 56 y.o. male  Subjective:   Member alert and oriented x3, denies pain or discomfort at this time.  Report compliance with medications and dialysis treatments.  State he uses [employer] for transportation.  Will also use for primary MD appointment on 12/10.  Objective:   Review of Systems  Constitutional: Negative.   HENT: Negative.   Eyes: Negative.   Respiratory: Negative.   Cardiovascular: Negative.   Gastrointestinal: Negative.   Genitourinary: Negative.   Musculoskeletal: Negative.   Skin: Negative.   Neurological: Negative.   Endo/Heme/Allergies: Negative.   Psychiatric/Behavioral: Negative.     Physical Exam  Constitutional: He is oriented to person, place, and time. He appears well-developed and well-nourished.  Neck: Normal range of motion.  Cardiovascular: Regular rhythm and normal heart sounds.  Tachycardia  Respiratory: Effort normal and breath sounds normal.  GI: Soft. Bowel sounds are normal.  Musculoskeletal: Normal range of motion.  Neurological: He is alert and oriented to person, place, and time.  Skin: Skin is warm and dry.   BP (!) 146/82   Pulse (!) 106   Resp 20   Ht 1.803 m (_0 )   Wt 215 lb (97.5 kg)   SpO2 97%   BMI 29.99 kg/m  \ Encounter Medications:   Outpatient Encounter Medications as of 08/05/2017  Medication Sig Note  . amLODipine (NORVASC) 5 MG tablet TAKE 1 TABLET (5 MG TOTAL) BY MOUTH DAILY. (Patient taking differently: Take 5 mg by mouth daily after supper. )   . B Complex-C-Zn-Folic Acid (DIALYVITE 546/TKPT PO) Take 1 tablet by mouth daily after supper.   . Blood Glucose Monitoring Suppl (ACCU-CHEK AVIVA PLUS) w/Device KIT Use once daily as directed to check blood sugar. E11.9   . cinacalcet (SENSIPAR) 60 MG tablet Take 60 mg by mouth daily after supper.    . ferric citrate (AURYXIA) 1 GM 210  MG(Fe) tablet Take 210 mg by mouth 3 (three) times daily with meals.   Marland Kitchen glipiZIDE (GLUCOTROL) 5 MG tablet Take 1 tablet (5 mg total) by mouth 2 (two) times daily before a meal. (Patient taking differently: Take 5 mg by mouth daily after supper. )   . glucose blood (ACCU-CHEK AVIVA PLUS) test strip Use once daily as directed to check blood sugar. E11.9   . hydrocortisone (ANUSOL-HC) 25 MG suppository Place 1 suppository (25 mg total) rectally 2 (two) times daily.   . Lancets (ACCU-CHEK SOFT TOUCH) lancets Use once daily as directed to check blood sugar. E11.9   . multivitamin (RENA-VIT) TABS tablet Take 1 tablet by mouth at bedtime.   Marland Kitchen omeprazole (PRILOSEC) 20 MG capsule Take 1 capsule (20 mg total) by mouth daily.   Marland Kitchen oxyCODONE-acetaminophen (PERCOCET/ROXICET) 5-325 MG tablet Take 1 tablet by mouth every 4 (four) hours as needed for moderate pain. 08/02/2017: #5 filled 07/30/17 [place] per PMP AWARE  . polyethylene glycol (MIRALAX / GLYCOLAX) packet Take 17 g by mouth 2 (two) times daily.   Marland Kitchen senna (SENOKOT) 8.6 MG TABS tablet Take 2 tablets (17.2 mg total) by mouth daily as needed for mild constipation.   Marland Kitchen warfarin (COUMADIN) 5 MG tablet Take 1 tablet every day as directed by [facility]   . docusate sodium (COLACE) 100 MG capsule Take 2 capsules (200 mg total) by mouth 2 (two) times daily. (Patient not taking: Reported on 08/05/2017)    No  facility-administered encounter medications on file as of 08/05/2017.     Functional Status:   In your present state of health, do you have any difficulty performing the following activities: 08/05/2017 08/03/2017  Hearing? N N  Vision? N N  Difficulty concentrating or making decisions? N N  Walking or climbing stairs? N N  Dressing or bathing? N N  Doing errands, shopping? N -  Preparing Food and eating ? N -  Using the Toilet? N -  In the past six months, have you accidently leaked urine? N -  Do you have problems with loss of bowel  control? N -  Managing your Medications? N -  Managing your Finances? N -  Housekeeping or managing your Housekeeping? N -  Some recent data might be hidden    Fall/Depression Screening:    Fall Risk  08/05/2017 05/21/2017 04/15/2017  Falls in the past year? No Yes No  Number falls in past yr: - 1 -  Injury with Fall? - Yes -  Risk Factor Category  - High Fall Risk -  Risk for fall due to : - History of fall(s);Impaired balance/gait;Impaired mobility -  Follow up - Education provided;Falls prevention discussed -   PHQ 2/9 Scores 08/05/2017 05/21/2017 04/15/2017 11/02/2016 07/20/2016 12/21/2015 09/07/2015  PHQ - 2 Score 0 0 0 0 0 1 0  PHQ- 9 Score - - 0 0 2 - -    Assessment:    Met with member at scheduled time.  [facility] care management services again explained, consent already on file.  Initial assessment complete.  He speaks about some legal issues with his ex-girlfriend, unable to see his daughter at this time, denies the need for assistance.  State he is attempting to improve his health in order to apply for custody.    Patient was recently discharged from hospital and all medications have been reviewed.  He does not currently use a pill box, but state he is interested in using.  Pill box provided.  He has history of diabetes, not checking blood sugar as he is waiting for glucose meter to e delivered from pharmacy.  Provided with [facility] calendar with monitoring logs, advised to record readings once he does start.  Denies any urgent concerns/questions at this time.  Provided with this care manager's contact information as well as 24 hour nurse triage line.  Report he will be moving within the next 2 weeks, advised to keep this care manager updated on new address.  Plan:   Will follow up next week with transition of care call.  [facility] CM Care Plan Problem One     Most Recent Value  Care Plan Problem One  Risk for reamdission related to GI bleed/hyperkalemia as evidenced by recent admission  Role  Documenting the Problem One  Care Management [place] for Problem One  Active  [facility] Long Term Goal   Member will not be readmitted to hospital within 31 days of discharge  [facility] Long Term Goal Start Date  07/31/17  Interventions for Problem One Long Term Goal  Discussed with member the importance of following discharge instructions, including follow up appointments, medications, diet, to decrease the risk of readmission  [facility] CM Short Term Goal #1   Member will report taking all medications as instructed (with understanding) over the next 4 weeks  [facility] CM Short Term Goal #1 Start Date  07/31/17  Interventions for Short Term Goal #1  Medications reviewed in the home, provided with pill  box for medication management  [facility] CM Short Term Goal #2   Member will keep and attend follow up appointment with primary MD within the next 2 weeks  [facility] CM Short Term Goal #2 Start Date  07/31/17  Interventions for Short Term Goal #2  Member reminded of importance of follow up with MD in effort to manage conditions.     Valente David, MSN, RN [facility] Care Management  [facility] Manager [phone]

## 2017-08-05 NOTE — Telephone Encounter (Signed)
Met with the patient when he came to the clinic this morning. He was requesting legal aid assistance after receiving a 50B.  He completed a legal aid referral and the document was faxed to [employer] [employer] - fax # [phone].

## 2017-08-06 DIAGNOSIS — N186 End stage renal disease: Secondary | ICD-10-CM | POA: Diagnosis not present

## 2017-08-06 DIAGNOSIS — N2581 Secondary hyperparathyroidism of renal origin: Secondary | ICD-10-CM | POA: Diagnosis not present

## 2017-08-07 ENCOUNTER — Telehealth: Payer: Self-pay

## 2017-08-07 NOTE — Telephone Encounter (Signed)
This CM spoke to Abelino Derrick with [employer] [place] [employer] regarding the referral. She stated that this is a conflict of interest for their agency and they are not able to assist and the patient has been notified.

## 2017-08-08 DIAGNOSIS — N2581 Secondary hyperparathyroidism of renal origin: Secondary | ICD-10-CM | POA: Diagnosis not present

## 2017-08-08 DIAGNOSIS — N186 End stage renal disease: Secondary | ICD-10-CM | POA: Diagnosis not present

## 2017-08-09 ENCOUNTER — Telehealth: Payer: Self-pay | Admitting: Pharmacist

## 2017-08-09 NOTE — Telephone Encounter (Signed)
Patient called me to inform me that he was leaving the area and moving to [place] immediately. He is leaving today due to issues with his daughter's mother. Advised patient to find [facility] in [place] ASAP for follow up as well as a dialysis center. Patient verbalized understanding and reported that his wife was helping to get him set up somewhere for next week. Encouraged patient to report to a local ED with any s/sx of bleeding or thrombosis. Patient verbalized understanding.  He reports that he will be back in February for a court date.

## 2017-08-12 ENCOUNTER — Ambulatory Visit: Payer: Self-pay | Admitting: Family Medicine

## 2017-08-13 ENCOUNTER — Other Ambulatory Visit: Payer: Self-pay | Admitting: *Deleted

## 2017-08-13 ENCOUNTER — Encounter: Payer: Self-pay | Admitting: *Deleted

## 2017-08-13 NOTE — Patient Outreach (Signed)
[facility]) Care Management  88/75/7972  SINAI MAHANY [birth year] [id number]   Weekly transition of care call placed to member, no answer.  HIPAA compliant voice message left.  Noted per chart from member's primary MD office that member has moved back to [place] over the weekend.  Will await call back.  If relocation confirmed, will close case at that time.     Update @ 1155:  Call received back from member.  He confirms that he has relocated back to [place].  He is aware that this care manager will close case at this time.  He voices concern regarding not being established with new dialysis treatment center yet, missed treatment Saturday and today.  He is advised to contact his old treatment center in [place] for assistance with finding location close to his new home.  Advised that if he is unable to do so and develop complications of missed treatments to go to the local urgent care/ED to have an emergent treatment.  Advised to inquire about local dialysis centers once emergent treatment is complete.  He verbalizes understanding.  Will notify care management assistant and primary MD of case closure.    Valente David, MSN, RN [facility] Care Management  [facility] Manager [phone]

## 2017-08-14 DIAGNOSIS — N186 End stage renal disease: Secondary | ICD-10-CM | POA: Diagnosis not present

## 2017-08-14 DIAGNOSIS — N2581 Secondary hyperparathyroidism of renal origin: Secondary | ICD-10-CM | POA: Diagnosis not present

## 2017-08-14 DIAGNOSIS — E8770 Fluid overload, unspecified: Secondary | ICD-10-CM | POA: Diagnosis not present

## 2017-08-15 DIAGNOSIS — N2581 Secondary hyperparathyroidism of renal origin: Secondary | ICD-10-CM | POA: Diagnosis not present

## 2017-08-15 DIAGNOSIS — N186 End stage renal disease: Secondary | ICD-10-CM | POA: Diagnosis not present

## 2017-08-15 DIAGNOSIS — E8770 Fluid overload, unspecified: Secondary | ICD-10-CM | POA: Diagnosis not present

## 2017-08-16 DIAGNOSIS — N186 End stage renal disease: Secondary | ICD-10-CM | POA: Diagnosis not present

## 2017-08-16 DIAGNOSIS — N2581 Secondary hyperparathyroidism of renal origin: Secondary | ICD-10-CM | POA: Diagnosis not present

## 2017-08-16 DIAGNOSIS — E8770 Fluid overload, unspecified: Secondary | ICD-10-CM | POA: Diagnosis not present

## 2017-08-18 DIAGNOSIS — Z743 Need for continuous supervision: Secondary | ICD-10-CM | POA: Diagnosis not present

## 2017-08-18 DIAGNOSIS — N186 End stage renal disease: Secondary | ICD-10-CM | POA: Diagnosis not present

## 2017-08-18 DIAGNOSIS — R9431 Abnormal electrocardiogram [ECG] [EKG]: Secondary | ICD-10-CM | POA: Diagnosis not present

## 2017-08-18 DIAGNOSIS — Z992 Dependence on renal dialysis: Secondary | ICD-10-CM | POA: Diagnosis not present

## 2017-08-18 DIAGNOSIS — R739 Hyperglycemia, unspecified: Secondary | ICD-10-CM | POA: Diagnosis not present

## 2017-08-18 DIAGNOSIS — E1122 Type 2 diabetes mellitus with diabetic chronic kidney disease: Secondary | ICD-10-CM | POA: Diagnosis not present

## 2017-08-18 DIAGNOSIS — M79601 Pain in right arm: Secondary | ICD-10-CM | POA: Diagnosis not present

## 2017-08-18 DIAGNOSIS — R079 Chest pain, unspecified: Secondary | ICD-10-CM | POA: Diagnosis not present

## 2017-08-18 DIAGNOSIS — E1165 Type 2 diabetes mellitus with hyperglycemia: Secondary | ICD-10-CM | POA: Diagnosis not present

## 2017-08-18 DIAGNOSIS — I120 Hypertensive chronic kidney disease with stage 5 chronic kidney disease or end stage renal disease: Secondary | ICD-10-CM | POA: Diagnosis not present

## 2017-08-18 DIAGNOSIS — I517 Cardiomegaly: Secondary | ICD-10-CM | POA: Diagnosis not present

## 2017-08-18 DIAGNOSIS — I249 Acute ischemic heart disease, unspecified: Secondary | ICD-10-CM | POA: Diagnosis not present

## 2017-08-18 DIAGNOSIS — M79605 Pain in left leg: Secondary | ICD-10-CM | POA: Diagnosis not present

## 2017-08-18 DIAGNOSIS — M79604 Pain in right leg: Secondary | ICD-10-CM | POA: Diagnosis not present

## 2017-08-18 DIAGNOSIS — Z89429 Acquired absence of other toe(s), unspecified side: Secondary | ICD-10-CM | POA: Diagnosis not present

## 2017-08-18 DIAGNOSIS — I208 Other forms of angina pectoris: Secondary | ICD-10-CM | POA: Diagnosis not present

## 2017-08-19 ENCOUNTER — Telehealth: Payer: Self-pay | Admitting: Pharmacist

## 2017-08-19 DIAGNOSIS — E8770 Fluid overload, unspecified: Secondary | ICD-10-CM | POA: Diagnosis present

## 2017-08-19 DIAGNOSIS — N186 End stage renal disease: Secondary | ICD-10-CM | POA: Diagnosis not present

## 2017-08-19 DIAGNOSIS — E1122 Type 2 diabetes mellitus with diabetic chronic kidney disease: Secondary | ICD-10-CM | POA: Diagnosis present

## 2017-08-19 DIAGNOSIS — Z89429 Acquired absence of other toe(s), unspecified side: Secondary | ICD-10-CM | POA: Diagnosis not present

## 2017-08-19 DIAGNOSIS — I10 Essential (primary) hypertension: Secondary | ICD-10-CM | POA: Diagnosis not present

## 2017-08-19 DIAGNOSIS — E1165 Type 2 diabetes mellitus with hyperglycemia: Secondary | ICD-10-CM | POA: Diagnosis not present

## 2017-08-19 DIAGNOSIS — Z9115 Patient's noncompliance with renal dialysis: Secondary | ICD-10-CM | POA: Diagnosis not present

## 2017-08-19 DIAGNOSIS — D631 Anemia in chronic kidney disease: Secondary | ICD-10-CM | POA: Diagnosis present

## 2017-08-19 DIAGNOSIS — R079 Chest pain, unspecified: Secondary | ICD-10-CM | POA: Diagnosis not present

## 2017-08-19 DIAGNOSIS — I120 Hypertensive chronic kidney disease with stage 5 chronic kidney disease or end stage renal disease: Secondary | ICD-10-CM | POA: Diagnosis not present

## 2017-08-19 DIAGNOSIS — Z7982 Long term (current) use of aspirin: Secondary | ICD-10-CM | POA: Diagnosis not present

## 2017-08-19 DIAGNOSIS — E785 Hyperlipidemia, unspecified: Secondary | ICD-10-CM | POA: Diagnosis present

## 2017-08-19 DIAGNOSIS — Z7984 Long term (current) use of oral hypoglycemic drugs: Secondary | ICD-10-CM | POA: Diagnosis not present

## 2017-08-19 DIAGNOSIS — Z7901 Long term (current) use of anticoagulants: Secondary | ICD-10-CM | POA: Diagnosis not present

## 2017-08-19 DIAGNOSIS — Z992 Dependence on renal dialysis: Secondary | ICD-10-CM | POA: Diagnosis not present

## 2017-08-19 DIAGNOSIS — I249 Acute ischemic heart disease, unspecified: Secondary | ICD-10-CM | POA: Diagnosis present

## 2017-08-19 NOTE — Telephone Encounter (Signed)
Received Skype message from [place] that patient was requesting to speak to me.  Called patient back and he reports that he is currently admitted to [facility] after having chest pains that started yesterday.  He told me he "almost had a heart attack." He is not sure of his current diagnosis. They are running tests on him now. He just wanted to let me know what was going on. He started dialysis down there last week on Wednesday. The hospital is managing his warfarin now but he doesn't know much about that right now. He says he will see me in February.  I transferred him back to the front so he could request medical records to be sent to [facility].

## 2017-08-23 DIAGNOSIS — N2581 Secondary hyperparathyroidism of renal origin: Secondary | ICD-10-CM | POA: Diagnosis not present

## 2017-08-23 DIAGNOSIS — E8770 Fluid overload, unspecified: Secondary | ICD-10-CM | POA: Diagnosis not present

## 2017-08-23 DIAGNOSIS — N186 End stage renal disease: Secondary | ICD-10-CM | POA: Diagnosis not present

## 2017-08-25 DIAGNOSIS — I2510 Atherosclerotic heart disease of native coronary artery without angina pectoris: Secondary | ICD-10-CM | POA: Diagnosis not present

## 2017-08-25 DIAGNOSIS — Z86718 Personal history of other venous thrombosis and embolism: Secondary | ICD-10-CM | POA: Diagnosis not present

## 2017-08-25 DIAGNOSIS — Z7901 Long term (current) use of anticoagulants: Secondary | ICD-10-CM | POA: Diagnosis not present

## 2017-08-25 DIAGNOSIS — Z87891 Personal history of nicotine dependence: Secondary | ICD-10-CM | POA: Diagnosis not present

## 2017-08-25 DIAGNOSIS — Z7984 Long term (current) use of oral hypoglycemic drugs: Secondary | ICD-10-CM | POA: Diagnosis not present

## 2017-08-25 DIAGNOSIS — I272 Pulmonary hypertension, unspecified: Secondary | ICD-10-CM | POA: Diagnosis not present

## 2017-08-25 DIAGNOSIS — Z743 Need for continuous supervision: Secondary | ICD-10-CM | POA: Diagnosis not present

## 2017-08-25 DIAGNOSIS — R0602 Shortness of breath: Secondary | ICD-10-CM | POA: Diagnosis not present

## 2017-08-25 DIAGNOSIS — I120 Hypertensive chronic kidney disease with stage 5 chronic kidney disease or end stage renal disease: Secondary | ICD-10-CM | POA: Diagnosis not present

## 2017-08-25 DIAGNOSIS — Z9115 Patient's noncompliance with renal dialysis: Secondary | ICD-10-CM | POA: Diagnosis not present

## 2017-08-25 DIAGNOSIS — R7989 Other specified abnormal findings of blood chemistry: Secondary | ICD-10-CM | POA: Diagnosis not present

## 2017-08-25 DIAGNOSIS — E1165 Type 2 diabetes mellitus with hyperglycemia: Secondary | ICD-10-CM | POA: Diagnosis not present

## 2017-08-25 DIAGNOSIS — R791 Abnormal coagulation profile: Secondary | ICD-10-CM | POA: Diagnosis not present

## 2017-08-25 DIAGNOSIS — Z9114 Patient's other noncompliance with medication regimen: Secondary | ICD-10-CM | POA: Diagnosis not present

## 2017-08-25 DIAGNOSIS — K6389 Other specified diseases of intestine: Secondary | ICD-10-CM | POA: Diagnosis not present

## 2017-08-25 DIAGNOSIS — I071 Rheumatic tricuspid insufficiency: Secondary | ICD-10-CM | POA: Diagnosis present

## 2017-08-25 DIAGNOSIS — R079 Chest pain, unspecified: Secondary | ICD-10-CM | POA: Diagnosis not present

## 2017-08-25 DIAGNOSIS — I214 Non-ST elevation (NSTEMI) myocardial infarction: Secondary | ICD-10-CM | POA: Diagnosis not present

## 2017-08-25 DIAGNOSIS — R0789 Other chest pain: Secondary | ICD-10-CM | POA: Diagnosis not present

## 2017-08-25 DIAGNOSIS — D631 Anemia in chronic kidney disease: Secondary | ICD-10-CM | POA: Diagnosis not present

## 2017-08-25 DIAGNOSIS — D72819 Decreased white blood cell count, unspecified: Secondary | ICD-10-CM | POA: Diagnosis present

## 2017-08-25 DIAGNOSIS — N186 End stage renal disease: Secondary | ICD-10-CM | POA: Diagnosis not present

## 2017-08-25 DIAGNOSIS — Z992 Dependence on renal dialysis: Secondary | ICD-10-CM | POA: Diagnosis not present

## 2017-08-25 DIAGNOSIS — N2581 Secondary hyperparathyroidism of renal origin: Secondary | ICD-10-CM | POA: Diagnosis not present

## 2017-08-25 DIAGNOSIS — E1122 Type 2 diabetes mellitus with diabetic chronic kidney disease: Secondary | ICD-10-CM | POA: Diagnosis not present

## 2017-08-25 DIAGNOSIS — E785 Hyperlipidemia, unspecified: Secondary | ICD-10-CM | POA: Diagnosis present

## 2017-08-25 DIAGNOSIS — I129 Hypertensive chronic kidney disease with stage 1 through stage 4 chronic kidney disease, or unspecified chronic kidney disease: Secondary | ICD-10-CM | POA: Diagnosis not present

## 2017-08-30 DIAGNOSIS — E8770 Fluid overload, unspecified: Secondary | ICD-10-CM | POA: Diagnosis not present

## 2017-08-30 DIAGNOSIS — N186 End stage renal disease: Secondary | ICD-10-CM | POA: Diagnosis not present

## 2017-08-30 DIAGNOSIS — N2581 Secondary hyperparathyroidism of renal origin: Secondary | ICD-10-CM | POA: Diagnosis not present

## 2017-09-01 DIAGNOSIS — E8770 Fluid overload, unspecified: Secondary | ICD-10-CM | POA: Diagnosis not present

## 2017-09-01 DIAGNOSIS — N2581 Secondary hyperparathyroidism of renal origin: Secondary | ICD-10-CM | POA: Diagnosis not present

## 2017-09-01 DIAGNOSIS — N186 End stage renal disease: Secondary | ICD-10-CM | POA: Diagnosis not present

## 2017-09-02 DIAGNOSIS — I120 Hypertensive chronic kidney disease with stage 5 chronic kidney disease or end stage renal disease: Secondary | ICD-10-CM | POA: Diagnosis not present

## 2017-09-02 DIAGNOSIS — D631 Anemia in chronic kidney disease: Secondary | ICD-10-CM | POA: Diagnosis not present

## 2017-09-02 DIAGNOSIS — N2581 Secondary hyperparathyroidism of renal origin: Secondary | ICD-10-CM | POA: Diagnosis not present

## 2017-09-02 DIAGNOSIS — Z992 Dependence on renal dialysis: Secondary | ICD-10-CM | POA: Diagnosis not present

## 2017-09-02 DIAGNOSIS — N186 End stage renal disease: Secondary | ICD-10-CM | POA: Diagnosis not present

## 2017-09-04 DIAGNOSIS — I120 Hypertensive chronic kidney disease with stage 5 chronic kidney disease or end stage renal disease: Secondary | ICD-10-CM | POA: Diagnosis not present

## 2017-09-04 DIAGNOSIS — N2581 Secondary hyperparathyroidism of renal origin: Secondary | ICD-10-CM | POA: Diagnosis not present

## 2017-09-04 DIAGNOSIS — N186 End stage renal disease: Secondary | ICD-10-CM | POA: Diagnosis not present

## 2017-09-04 DIAGNOSIS — E785 Hyperlipidemia, unspecified: Secondary | ICD-10-CM | POA: Diagnosis not present

## 2017-09-04 DIAGNOSIS — I25119 Atherosclerotic heart disease of native coronary artery with unspecified angina pectoris: Secondary | ICD-10-CM | POA: Diagnosis not present

## 2017-09-06 DIAGNOSIS — N2581 Secondary hyperparathyroidism of renal origin: Secondary | ICD-10-CM | POA: Diagnosis not present

## 2017-09-06 DIAGNOSIS — N186 End stage renal disease: Secondary | ICD-10-CM | POA: Diagnosis not present

## 2017-09-09 DIAGNOSIS — N2581 Secondary hyperparathyroidism of renal origin: Secondary | ICD-10-CM | POA: Diagnosis not present

## 2017-09-09 DIAGNOSIS — N186 End stage renal disease: Secondary | ICD-10-CM | POA: Diagnosis not present

## 2017-09-11 DIAGNOSIS — N186 End stage renal disease: Secondary | ICD-10-CM | POA: Diagnosis not present

## 2017-09-11 DIAGNOSIS — N2581 Secondary hyperparathyroidism of renal origin: Secondary | ICD-10-CM | POA: Diagnosis not present

## 2017-09-12 DIAGNOSIS — I5032 Chronic diastolic (congestive) heart failure: Secondary | ICD-10-CM | POA: Diagnosis not present

## 2017-09-12 DIAGNOSIS — I214 Non-ST elevation (NSTEMI) myocardial infarction: Secondary | ICD-10-CM | POA: Diagnosis not present

## 2017-09-13 DIAGNOSIS — N186 End stage renal disease: Secondary | ICD-10-CM | POA: Diagnosis not present

## 2017-09-13 DIAGNOSIS — N2581 Secondary hyperparathyroidism of renal origin: Secondary | ICD-10-CM | POA: Diagnosis not present

## 2017-09-16 DIAGNOSIS — N2581 Secondary hyperparathyroidism of renal origin: Secondary | ICD-10-CM | POA: Diagnosis not present

## 2017-09-16 DIAGNOSIS — N186 End stage renal disease: Secondary | ICD-10-CM | POA: Diagnosis not present

## 2017-09-18 DIAGNOSIS — R079 Chest pain, unspecified: Secondary | ICD-10-CM | POA: Diagnosis not present

## 2017-09-18 DIAGNOSIS — D638 Anemia in other chronic diseases classified elsewhere: Secondary | ICD-10-CM | POA: Diagnosis present

## 2017-09-18 DIAGNOSIS — E1122 Type 2 diabetes mellitus with diabetic chronic kidney disease: Secondary | ICD-10-CM | POA: Diagnosis not present

## 2017-09-18 DIAGNOSIS — Z955 Presence of coronary angioplasty implant and graft: Secondary | ICD-10-CM | POA: Diagnosis not present

## 2017-09-18 DIAGNOSIS — I10 Essential (primary) hypertension: Secondary | ICD-10-CM | POA: Diagnosis not present

## 2017-09-18 DIAGNOSIS — I129 Hypertensive chronic kidney disease with stage 1 through stage 4 chronic kidney disease, or unspecified chronic kidney disease: Secondary | ICD-10-CM | POA: Diagnosis not present

## 2017-09-18 DIAGNOSIS — D631 Anemia in chronic kidney disease: Secondary | ICD-10-CM | POA: Diagnosis not present

## 2017-09-18 DIAGNOSIS — Z743 Need for continuous supervision: Secondary | ICD-10-CM | POA: Diagnosis not present

## 2017-09-18 DIAGNOSIS — J189 Pneumonia, unspecified organism: Secondary | ICD-10-CM | POA: Diagnosis not present

## 2017-09-18 DIAGNOSIS — J9811 Atelectasis: Secondary | ICD-10-CM | POA: Diagnosis not present

## 2017-09-18 DIAGNOSIS — E0590 Thyrotoxicosis, unspecified without thyrotoxic crisis or storm: Secondary | ICD-10-CM | POA: Diagnosis present

## 2017-09-18 DIAGNOSIS — I120 Hypertensive chronic kidney disease with stage 5 chronic kidney disease or end stage renal disease: Secondary | ICD-10-CM | POA: Diagnosis not present

## 2017-09-18 DIAGNOSIS — R0789 Other chest pain: Secondary | ICD-10-CM | POA: Diagnosis not present

## 2017-09-18 DIAGNOSIS — J069 Acute upper respiratory infection, unspecified: Secondary | ICD-10-CM | POA: Diagnosis not present

## 2017-09-18 DIAGNOSIS — N2581 Secondary hyperparathyroidism of renal origin: Secondary | ICD-10-CM | POA: Diagnosis present

## 2017-09-18 DIAGNOSIS — I2510 Atherosclerotic heart disease of native coronary artery without angina pectoris: Secondary | ICD-10-CM | POA: Diagnosis not present

## 2017-09-18 DIAGNOSIS — N186 End stage renal disease: Secondary | ICD-10-CM | POA: Diagnosis not present

## 2017-09-18 DIAGNOSIS — E1165 Type 2 diabetes mellitus with hyperglycemia: Secondary | ICD-10-CM | POA: Diagnosis present

## 2017-09-18 DIAGNOSIS — R05 Cough: Secondary | ICD-10-CM | POA: Diagnosis not present

## 2017-09-23 DIAGNOSIS — Z79891 Long term (current) use of opiate analgesic: Secondary | ICD-10-CM | POA: Diagnosis not present

## 2017-09-23 DIAGNOSIS — R0989 Other specified symptoms and signs involving the circulatory and respiratory systems: Secondary | ICD-10-CM | POA: Diagnosis not present

## 2017-09-23 DIAGNOSIS — Z89429 Acquired absence of other toe(s), unspecified side: Secondary | ICD-10-CM | POA: Diagnosis not present

## 2017-09-23 DIAGNOSIS — Z955 Presence of coronary angioplasty implant and graft: Secondary | ICD-10-CM | POA: Diagnosis not present

## 2017-09-23 DIAGNOSIS — E119 Type 2 diabetes mellitus without complications: Secondary | ICD-10-CM | POA: Diagnosis not present

## 2017-09-23 DIAGNOSIS — I10 Essential (primary) hypertension: Secondary | ICD-10-CM | POA: Diagnosis not present

## 2017-09-23 DIAGNOSIS — R0789 Other chest pain: Secondary | ICD-10-CM | POA: Diagnosis not present

## 2017-09-23 DIAGNOSIS — I120 Hypertensive chronic kidney disease with stage 5 chronic kidney disease or end stage renal disease: Secondary | ICD-10-CM | POA: Diagnosis not present

## 2017-09-23 DIAGNOSIS — E1122 Type 2 diabetes mellitus with diabetic chronic kidney disease: Secondary | ICD-10-CM | POA: Diagnosis not present

## 2017-09-23 DIAGNOSIS — I2510 Atherosclerotic heart disease of native coronary artery without angina pectoris: Secondary | ICD-10-CM | POA: Diagnosis not present

## 2017-09-23 DIAGNOSIS — F17200 Nicotine dependence, unspecified, uncomplicated: Secondary | ICD-10-CM | POA: Diagnosis not present

## 2017-09-23 DIAGNOSIS — Z7982 Long term (current) use of aspirin: Secondary | ICD-10-CM | POA: Diagnosis not present

## 2017-09-23 DIAGNOSIS — Z743 Need for continuous supervision: Secondary | ICD-10-CM | POA: Diagnosis not present

## 2017-09-23 DIAGNOSIS — E1165 Type 2 diabetes mellitus with hyperglycemia: Secondary | ICD-10-CM | POA: Diagnosis not present

## 2017-09-23 DIAGNOSIS — N186 End stage renal disease: Secondary | ICD-10-CM | POA: Diagnosis not present

## 2017-09-23 DIAGNOSIS — Z7984 Long term (current) use of oral hypoglycemic drugs: Secondary | ICD-10-CM | POA: Diagnosis not present

## 2017-09-23 DIAGNOSIS — E8770 Fluid overload, unspecified: Secondary | ICD-10-CM | POA: Diagnosis not present

## 2017-09-23 DIAGNOSIS — R079 Chest pain, unspecified: Secondary | ICD-10-CM | POA: Diagnosis not present

## 2017-09-23 DIAGNOSIS — R0609 Other forms of dyspnea: Secondary | ICD-10-CM | POA: Diagnosis not present

## 2017-09-23 DIAGNOSIS — I071 Rheumatic tricuspid insufficiency: Secondary | ICD-10-CM | POA: Diagnosis not present

## 2017-09-23 DIAGNOSIS — R0600 Dyspnea, unspecified: Secondary | ICD-10-CM | POA: Diagnosis not present

## 2017-09-23 DIAGNOSIS — Z992 Dependence on renal dialysis: Secondary | ICD-10-CM | POA: Diagnosis not present

## 2017-09-23 DIAGNOSIS — Z89411 Acquired absence of right great toe: Secondary | ICD-10-CM | POA: Diagnosis not present

## 2017-09-23 DIAGNOSIS — Z833 Family history of diabetes mellitus: Secondary | ICD-10-CM | POA: Diagnosis not present

## 2017-09-23 DIAGNOSIS — Z89412 Acquired absence of left great toe: Secondary | ICD-10-CM | POA: Diagnosis not present

## 2017-09-24 DIAGNOSIS — E119 Type 2 diabetes mellitus without complications: Secondary | ICD-10-CM | POA: Diagnosis not present

## 2017-09-24 DIAGNOSIS — N186 End stage renal disease: Secondary | ICD-10-CM | POA: Diagnosis not present

## 2017-09-24 DIAGNOSIS — R079 Chest pain, unspecified: Secondary | ICD-10-CM | POA: Diagnosis not present

## 2017-09-24 DIAGNOSIS — Z89429 Acquired absence of other toe(s), unspecified side: Secondary | ICD-10-CM | POA: Diagnosis not present

## 2017-09-24 DIAGNOSIS — E1165 Type 2 diabetes mellitus with hyperglycemia: Secondary | ICD-10-CM | POA: Diagnosis not present

## 2017-09-24 DIAGNOSIS — I10 Essential (primary) hypertension: Secondary | ICD-10-CM | POA: Diagnosis not present

## 2017-09-25 DIAGNOSIS — D631 Anemia in chronic kidney disease: Secondary | ICD-10-CM | POA: Diagnosis not present

## 2017-09-25 DIAGNOSIS — I071 Rheumatic tricuspid insufficiency: Secondary | ICD-10-CM | POA: Diagnosis present

## 2017-09-25 DIAGNOSIS — I272 Pulmonary hypertension, unspecified: Secondary | ICD-10-CM | POA: Diagnosis not present

## 2017-09-25 DIAGNOSIS — Z992 Dependence on renal dialysis: Secondary | ICD-10-CM | POA: Diagnosis not present

## 2017-09-25 DIAGNOSIS — I5023 Acute on chronic systolic (congestive) heart failure: Secondary | ICD-10-CM | POA: Diagnosis not present

## 2017-09-25 DIAGNOSIS — K5900 Constipation, unspecified: Secondary | ICD-10-CM | POA: Diagnosis not present

## 2017-09-25 DIAGNOSIS — I255 Ischemic cardiomyopathy: Secondary | ICD-10-CM | POA: Diagnosis not present

## 2017-09-25 DIAGNOSIS — I4581 Long QT syndrome: Secondary | ICD-10-CM | POA: Diagnosis not present

## 2017-09-25 DIAGNOSIS — F1411 Cocaine abuse, in remission: Secondary | ICD-10-CM | POA: Diagnosis present

## 2017-09-25 DIAGNOSIS — E119 Type 2 diabetes mellitus without complications: Secondary | ICD-10-CM | POA: Diagnosis not present

## 2017-09-25 DIAGNOSIS — I5033 Acute on chronic diastolic (congestive) heart failure: Secondary | ICD-10-CM | POA: Diagnosis present

## 2017-09-25 DIAGNOSIS — Z955 Presence of coronary angioplasty implant and graft: Secondary | ICD-10-CM | POA: Diagnosis not present

## 2017-09-25 DIAGNOSIS — D72829 Elevated white blood cell count, unspecified: Secondary | ICD-10-CM | POA: Diagnosis not present

## 2017-09-25 DIAGNOSIS — N186 End stage renal disease: Secondary | ICD-10-CM | POA: Diagnosis not present

## 2017-09-25 DIAGNOSIS — E1122 Type 2 diabetes mellitus with diabetic chronic kidney disease: Secondary | ICD-10-CM | POA: Diagnosis present

## 2017-09-25 DIAGNOSIS — I25118 Atherosclerotic heart disease of native coronary artery with other forms of angina pectoris: Secondary | ICD-10-CM | POA: Diagnosis not present

## 2017-09-25 DIAGNOSIS — I25119 Atherosclerotic heart disease of native coronary artery with unspecified angina pectoris: Secondary | ICD-10-CM | POA: Diagnosis present

## 2017-09-25 DIAGNOSIS — I2510 Atherosclerotic heart disease of native coronary artery without angina pectoris: Secondary | ICD-10-CM | POA: Diagnosis not present

## 2017-09-25 DIAGNOSIS — I6523 Occlusion and stenosis of bilateral carotid arteries: Secondary | ICD-10-CM | POA: Diagnosis not present

## 2017-09-25 DIAGNOSIS — E875 Hyperkalemia: Secondary | ICD-10-CM | POA: Diagnosis not present

## 2017-09-25 DIAGNOSIS — E785 Hyperlipidemia, unspecified: Secondary | ICD-10-CM | POA: Diagnosis not present

## 2017-09-25 DIAGNOSIS — Z01810 Encounter for preprocedural cardiovascular examination: Secondary | ICD-10-CM | POA: Diagnosis not present

## 2017-09-25 DIAGNOSIS — I132 Hypertensive heart and chronic kidney disease with heart failure and with stage 5 chronic kidney disease, or end stage renal disease: Secondary | ICD-10-CM | POA: Diagnosis present

## 2017-09-25 DIAGNOSIS — R918 Other nonspecific abnormal finding of lung field: Secondary | ICD-10-CM | POA: Diagnosis not present

## 2017-09-25 DIAGNOSIS — E782 Mixed hyperlipidemia: Secondary | ICD-10-CM | POA: Diagnosis present

## 2017-09-25 DIAGNOSIS — I959 Hypotension, unspecified: Secondary | ICD-10-CM | POA: Diagnosis not present

## 2017-09-25 DIAGNOSIS — I724 Aneurysm of artery of lower extremity: Secondary | ICD-10-CM | POA: Diagnosis not present

## 2017-09-25 DIAGNOSIS — D649 Anemia, unspecified: Secondary | ICD-10-CM | POA: Diagnosis not present

## 2017-09-25 DIAGNOSIS — E1140 Type 2 diabetes mellitus with diabetic neuropathy, unspecified: Secondary | ICD-10-CM | POA: Diagnosis present

## 2017-09-25 DIAGNOSIS — D62 Acute posthemorrhagic anemia: Secondary | ICD-10-CM | POA: Diagnosis not present

## 2017-09-25 DIAGNOSIS — J90 Pleural effusion, not elsewhere classified: Secondary | ICD-10-CM | POA: Diagnosis not present

## 2017-09-25 DIAGNOSIS — Z89412 Acquired absence of left great toe: Secondary | ICD-10-CM | POA: Diagnosis not present

## 2017-09-25 DIAGNOSIS — I493 Ventricular premature depolarization: Secondary | ICD-10-CM | POA: Diagnosis not present

## 2017-09-25 DIAGNOSIS — Z9989 Dependence on other enabling machines and devices: Secondary | ICD-10-CM | POA: Diagnosis not present

## 2017-09-25 DIAGNOSIS — R0600 Dyspnea, unspecified: Secondary | ICD-10-CM | POA: Diagnosis not present

## 2017-09-25 DIAGNOSIS — I10 Essential (primary) hypertension: Secondary | ICD-10-CM | POA: Diagnosis not present

## 2017-09-25 DIAGNOSIS — Z9889 Other specified postprocedural states: Secondary | ICD-10-CM | POA: Diagnosis not present

## 2017-09-25 DIAGNOSIS — E871 Hypo-osmolality and hyponatremia: Secondary | ICD-10-CM | POA: Diagnosis present

## 2017-09-25 DIAGNOSIS — E1165 Type 2 diabetes mellitus with hyperglycemia: Secondary | ICD-10-CM | POA: Diagnosis not present

## 2017-09-25 DIAGNOSIS — Z951 Presence of aortocoronary bypass graft: Secondary | ICD-10-CM | POA: Diagnosis not present

## 2017-09-25 DIAGNOSIS — I214 Non-ST elevation (NSTEMI) myocardial infarction: Secondary | ICD-10-CM | POA: Diagnosis not present

## 2017-09-25 DIAGNOSIS — R079 Chest pain, unspecified: Secondary | ICD-10-CM | POA: Diagnosis not present

## 2017-09-25 DIAGNOSIS — J9811 Atelectasis: Secondary | ICD-10-CM | POA: Diagnosis not present

## 2017-09-25 DIAGNOSIS — I120 Hypertensive chronic kidney disease with stage 5 chronic kidney disease or end stage renal disease: Secondary | ICD-10-CM | POA: Diagnosis not present

## 2017-09-25 DIAGNOSIS — I213 ST elevation (STEMI) myocardial infarction of unspecified site: Secondary | ICD-10-CM | POA: Diagnosis not present

## 2017-09-25 DIAGNOSIS — I5032 Chronic diastolic (congestive) heart failure: Secondary | ICD-10-CM | POA: Diagnosis not present

## 2017-09-25 DIAGNOSIS — R0789 Other chest pain: Secondary | ICD-10-CM | POA: Diagnosis not present

## 2017-09-25 DIAGNOSIS — K219 Gastro-esophageal reflux disease without esophagitis: Secondary | ICD-10-CM | POA: Diagnosis present

## 2017-09-25 DIAGNOSIS — I491 Atrial premature depolarization: Secondary | ICD-10-CM | POA: Diagnosis not present

## 2017-09-25 DIAGNOSIS — Z7984 Long term (current) use of oral hypoglycemic drugs: Secondary | ICD-10-CM | POA: Diagnosis not present

## 2017-09-25 DIAGNOSIS — R0989 Other specified symptoms and signs involving the circulatory and respiratory systems: Secondary | ICD-10-CM | POA: Diagnosis not present

## 2017-09-25 DIAGNOSIS — Z6828 Body mass index (BMI) 28.0-28.9, adult: Secondary | ICD-10-CM | POA: Diagnosis not present

## 2017-09-26 DIAGNOSIS — N186 End stage renal disease: Secondary | ICD-10-CM | POA: Diagnosis not present

## 2017-09-27 DIAGNOSIS — N186 End stage renal disease: Secondary | ICD-10-CM | POA: Diagnosis not present

## 2017-09-30 DIAGNOSIS — N186 End stage renal disease: Secondary | ICD-10-CM | POA: Diagnosis not present

## 2017-10-03 DIAGNOSIS — N186 End stage renal disease: Secondary | ICD-10-CM | POA: Diagnosis not present

## 2017-10-13 DIAGNOSIS — I120 Hypertensive chronic kidney disease with stage 5 chronic kidney disease or end stage renal disease: Secondary | ICD-10-CM | POA: Diagnosis not present

## 2017-10-13 DIAGNOSIS — I272 Pulmonary hypertension, unspecified: Secondary | ICD-10-CM | POA: Diagnosis not present

## 2017-10-13 DIAGNOSIS — Z992 Dependence on renal dialysis: Secondary | ICD-10-CM | POA: Diagnosis not present

## 2017-10-13 DIAGNOSIS — N186 End stage renal disease: Secondary | ICD-10-CM | POA: Diagnosis not present

## 2017-10-13 DIAGNOSIS — E1122 Type 2 diabetes mellitus with diabetic chronic kidney disease: Secondary | ICD-10-CM | POA: Diagnosis not present

## 2017-10-13 DIAGNOSIS — D631 Anemia in chronic kidney disease: Secondary | ICD-10-CM | POA: Diagnosis not present

## 2017-10-14 ENCOUNTER — Encounter: Payer: Medicare Other | Admitting: Podiatry

## 2017-10-14 DIAGNOSIS — D631 Anemia in chronic kidney disease: Secondary | ICD-10-CM | POA: Diagnosis not present

## 2017-10-14 DIAGNOSIS — I272 Pulmonary hypertension, unspecified: Secondary | ICD-10-CM | POA: Diagnosis not present

## 2017-10-14 DIAGNOSIS — E1122 Type 2 diabetes mellitus with diabetic chronic kidney disease: Secondary | ICD-10-CM | POA: Diagnosis not present

## 2017-10-14 DIAGNOSIS — N186 End stage renal disease: Secondary | ICD-10-CM | POA: Diagnosis not present

## 2017-10-14 DIAGNOSIS — Z992 Dependence on renal dialysis: Secondary | ICD-10-CM | POA: Diagnosis not present

## 2017-10-14 DIAGNOSIS — I120 Hypertensive chronic kidney disease with stage 5 chronic kidney disease or end stage renal disease: Secondary | ICD-10-CM | POA: Diagnosis not present

## 2017-10-15 DIAGNOSIS — N186 End stage renal disease: Secondary | ICD-10-CM | POA: Diagnosis not present

## 2017-10-15 DIAGNOSIS — D631 Anemia in chronic kidney disease: Secondary | ICD-10-CM | POA: Diagnosis not present

## 2017-10-15 DIAGNOSIS — N2581 Secondary hyperparathyroidism of renal origin: Secondary | ICD-10-CM | POA: Diagnosis not present

## 2017-10-16 DIAGNOSIS — I272 Pulmonary hypertension, unspecified: Secondary | ICD-10-CM | POA: Diagnosis not present

## 2017-10-16 DIAGNOSIS — I120 Hypertensive chronic kidney disease with stage 5 chronic kidney disease or end stage renal disease: Secondary | ICD-10-CM | POA: Diagnosis not present

## 2017-10-16 DIAGNOSIS — E1122 Type 2 diabetes mellitus with diabetic chronic kidney disease: Secondary | ICD-10-CM | POA: Diagnosis not present

## 2017-10-16 DIAGNOSIS — N186 End stage renal disease: Secondary | ICD-10-CM | POA: Diagnosis not present

## 2017-10-16 DIAGNOSIS — Z992 Dependence on renal dialysis: Secondary | ICD-10-CM | POA: Diagnosis not present

## 2017-10-16 DIAGNOSIS — N2581 Secondary hyperparathyroidism of renal origin: Secondary | ICD-10-CM | POA: Diagnosis not present

## 2017-10-16 DIAGNOSIS — D631 Anemia in chronic kidney disease: Secondary | ICD-10-CM | POA: Diagnosis not present

## 2017-10-17 DIAGNOSIS — N186 End stage renal disease: Secondary | ICD-10-CM | POA: Diagnosis not present

## 2017-10-17 DIAGNOSIS — D631 Anemia in chronic kidney disease: Secondary | ICD-10-CM | POA: Diagnosis not present

## 2017-10-17 DIAGNOSIS — I120 Hypertensive chronic kidney disease with stage 5 chronic kidney disease or end stage renal disease: Secondary | ICD-10-CM | POA: Diagnosis not present

## 2017-10-17 DIAGNOSIS — Z992 Dependence on renal dialysis: Secondary | ICD-10-CM | POA: Diagnosis not present

## 2017-10-17 DIAGNOSIS — E1122 Type 2 diabetes mellitus with diabetic chronic kidney disease: Secondary | ICD-10-CM | POA: Diagnosis not present

## 2017-10-17 DIAGNOSIS — I272 Pulmonary hypertension, unspecified: Secondary | ICD-10-CM | POA: Diagnosis not present

## 2017-10-18 DIAGNOSIS — N2581 Secondary hyperparathyroidism of renal origin: Secondary | ICD-10-CM | POA: Diagnosis not present

## 2017-10-18 DIAGNOSIS — Z992 Dependence on renal dialysis: Secondary | ICD-10-CM | POA: Diagnosis not present

## 2017-10-18 DIAGNOSIS — D631 Anemia in chronic kidney disease: Secondary | ICD-10-CM | POA: Diagnosis not present

## 2017-10-18 DIAGNOSIS — I272 Pulmonary hypertension, unspecified: Secondary | ICD-10-CM | POA: Diagnosis not present

## 2017-10-18 DIAGNOSIS — I120 Hypertensive chronic kidney disease with stage 5 chronic kidney disease or end stage renal disease: Secondary | ICD-10-CM | POA: Diagnosis not present

## 2017-10-18 DIAGNOSIS — E1122 Type 2 diabetes mellitus with diabetic chronic kidney disease: Secondary | ICD-10-CM | POA: Diagnosis not present

## 2017-10-18 DIAGNOSIS — N186 End stage renal disease: Secondary | ICD-10-CM | POA: Diagnosis not present

## 2017-10-21 DIAGNOSIS — D631 Anemia in chronic kidney disease: Secondary | ICD-10-CM | POA: Diagnosis not present

## 2017-10-21 DIAGNOSIS — N2581 Secondary hyperparathyroidism of renal origin: Secondary | ICD-10-CM | POA: Diagnosis not present

## 2017-10-21 DIAGNOSIS — N186 End stage renal disease: Secondary | ICD-10-CM | POA: Diagnosis not present

## 2017-10-22 DIAGNOSIS — N186 End stage renal disease: Secondary | ICD-10-CM | POA: Diagnosis not present

## 2017-10-22 DIAGNOSIS — I120 Hypertensive chronic kidney disease with stage 5 chronic kidney disease or end stage renal disease: Secondary | ICD-10-CM | POA: Diagnosis not present

## 2017-10-22 DIAGNOSIS — D631 Anemia in chronic kidney disease: Secondary | ICD-10-CM | POA: Diagnosis not present

## 2017-10-22 DIAGNOSIS — I272 Pulmonary hypertension, unspecified: Secondary | ICD-10-CM | POA: Diagnosis not present

## 2017-10-22 DIAGNOSIS — E1122 Type 2 diabetes mellitus with diabetic chronic kidney disease: Secondary | ICD-10-CM | POA: Diagnosis not present

## 2017-10-22 DIAGNOSIS — Z992 Dependence on renal dialysis: Secondary | ICD-10-CM | POA: Diagnosis not present

## 2017-10-23 DIAGNOSIS — D631 Anemia in chronic kidney disease: Secondary | ICD-10-CM | POA: Diagnosis present

## 2017-10-23 DIAGNOSIS — Z833 Family history of diabetes mellitus: Secondary | ICD-10-CM | POA: Diagnosis not present

## 2017-10-23 DIAGNOSIS — R918 Other nonspecific abnormal finding of lung field: Secondary | ICD-10-CM | POA: Diagnosis present

## 2017-10-23 DIAGNOSIS — I2510 Atherosclerotic heart disease of native coronary artery without angina pectoris: Secondary | ICD-10-CM | POA: Diagnosis not present

## 2017-10-23 DIAGNOSIS — I272 Pulmonary hypertension, unspecified: Secondary | ICD-10-CM | POA: Diagnosis present

## 2017-10-23 DIAGNOSIS — R079 Chest pain, unspecified: Secondary | ICD-10-CM | POA: Diagnosis not present

## 2017-10-23 DIAGNOSIS — R9431 Abnormal electrocardiogram [ECG] [EKG]: Secondary | ICD-10-CM | POA: Diagnosis not present

## 2017-10-23 DIAGNOSIS — D638 Anemia in other chronic diseases classified elsewhere: Secondary | ICD-10-CM | POA: Diagnosis not present

## 2017-10-23 DIAGNOSIS — Z951 Presence of aortocoronary bypass graft: Secondary | ICD-10-CM | POA: Diagnosis not present

## 2017-10-23 DIAGNOSIS — I5023 Acute on chronic systolic (congestive) heart failure: Secondary | ICD-10-CM | POA: Diagnosis present

## 2017-10-23 DIAGNOSIS — J90 Pleural effusion, not elsewhere classified: Secondary | ICD-10-CM | POA: Diagnosis not present

## 2017-10-23 DIAGNOSIS — E785 Hyperlipidemia, unspecified: Secondary | ICD-10-CM | POA: Diagnosis present

## 2017-10-23 DIAGNOSIS — I081 Rheumatic disorders of both mitral and tricuspid valves: Secondary | ICD-10-CM | POA: Diagnosis present

## 2017-10-23 DIAGNOSIS — N186 End stage renal disease: Secondary | ICD-10-CM | POA: Diagnosis not present

## 2017-10-23 DIAGNOSIS — I25810 Atherosclerosis of coronary artery bypass graft(s) without angina pectoris: Secondary | ICD-10-CM | POA: Diagnosis not present

## 2017-10-23 DIAGNOSIS — N2581 Secondary hyperparathyroidism of renal origin: Secondary | ICD-10-CM | POA: Diagnosis present

## 2017-10-23 DIAGNOSIS — I5021 Acute systolic (congestive) heart failure: Secondary | ICD-10-CM | POA: Diagnosis not present

## 2017-10-23 DIAGNOSIS — I255 Ischemic cardiomyopathy: Secondary | ICD-10-CM | POA: Diagnosis not present

## 2017-10-23 DIAGNOSIS — I959 Hypotension, unspecified: Secondary | ICD-10-CM | POA: Diagnosis not present

## 2017-10-23 DIAGNOSIS — Z87891 Personal history of nicotine dependence: Secondary | ICD-10-CM | POA: Diagnosis not present

## 2017-10-23 DIAGNOSIS — I10 Essential (primary) hypertension: Secondary | ICD-10-CM | POA: Diagnosis not present

## 2017-10-23 DIAGNOSIS — E1122 Type 2 diabetes mellitus with diabetic chronic kidney disease: Secondary | ICD-10-CM | POA: Diagnosis present

## 2017-10-23 DIAGNOSIS — Z818 Family history of other mental and behavioral disorders: Secondary | ICD-10-CM | POA: Diagnosis not present

## 2017-10-23 DIAGNOSIS — Z86718 Personal history of other venous thrombosis and embolism: Secondary | ICD-10-CM | POA: Diagnosis not present

## 2017-10-23 DIAGNOSIS — E1165 Type 2 diabetes mellitus with hyperglycemia: Secondary | ICD-10-CM | POA: Diagnosis not present

## 2017-10-23 DIAGNOSIS — M7989 Other specified soft tissue disorders: Secondary | ICD-10-CM | POA: Diagnosis not present

## 2017-10-23 DIAGNOSIS — R0789 Other chest pain: Secondary | ICD-10-CM | POA: Diagnosis not present

## 2017-10-23 DIAGNOSIS — Z7982 Long term (current) use of aspirin: Secondary | ICD-10-CM | POA: Diagnosis not present

## 2017-10-23 DIAGNOSIS — I132 Hypertensive heart and chronic kidney disease with heart failure and with stage 5 chronic kidney disease, or end stage renal disease: Secondary | ICD-10-CM | POA: Diagnosis not present

## 2017-10-23 DIAGNOSIS — Z955 Presence of coronary angioplasty implant and graft: Secondary | ICD-10-CM | POA: Diagnosis not present

## 2017-10-23 DIAGNOSIS — I214 Non-ST elevation (NSTEMI) myocardial infarction: Secondary | ICD-10-CM | POA: Diagnosis not present

## 2017-10-23 DIAGNOSIS — E119 Type 2 diabetes mellitus without complications: Secondary | ICD-10-CM | POA: Diagnosis not present

## 2017-10-23 DIAGNOSIS — I25110 Atherosclerotic heart disease of native coronary artery with unstable angina pectoris: Secondary | ICD-10-CM | POA: Diagnosis not present

## 2017-10-23 DIAGNOSIS — E875 Hyperkalemia: Secondary | ICD-10-CM | POA: Diagnosis present

## 2017-10-23 DIAGNOSIS — I208 Other forms of angina pectoris: Secondary | ICD-10-CM | POA: Diagnosis not present

## 2017-10-23 DIAGNOSIS — I120 Hypertensive chronic kidney disease with stage 5 chronic kidney disease or end stage renal disease: Secondary | ICD-10-CM | POA: Diagnosis not present

## 2017-10-23 DIAGNOSIS — Z7901 Long term (current) use of anticoagulants: Secondary | ICD-10-CM | POA: Diagnosis not present

## 2017-10-23 DIAGNOSIS — Z992 Dependence on renal dialysis: Secondary | ICD-10-CM | POA: Diagnosis not present

## 2017-10-23 DIAGNOSIS — Z743 Need for continuous supervision: Secondary | ICD-10-CM | POA: Diagnosis not present

## 2017-10-23 DIAGNOSIS — Z89412 Acquired absence of left great toe: Secondary | ICD-10-CM | POA: Diagnosis not present

## 2017-10-24 NOTE — Progress Notes (Signed)
This encounter was created in error - please disregard.

## 2017-10-30 DIAGNOSIS — D631 Anemia in chronic kidney disease: Secondary | ICD-10-CM | POA: Diagnosis not present

## 2017-10-30 DIAGNOSIS — N186 End stage renal disease: Secondary | ICD-10-CM | POA: Diagnosis not present

## 2017-10-30 DIAGNOSIS — N2581 Secondary hyperparathyroidism of renal origin: Secondary | ICD-10-CM | POA: Diagnosis not present

## 2017-10-31 DIAGNOSIS — N186 End stage renal disease: Secondary | ICD-10-CM | POA: Diagnosis not present

## 2017-11-01 DIAGNOSIS — N186 End stage renal disease: Secondary | ICD-10-CM | POA: Diagnosis not present

## 2017-11-01 DIAGNOSIS — D631 Anemia in chronic kidney disease: Secondary | ICD-10-CM | POA: Diagnosis not present

## 2017-11-01 DIAGNOSIS — N2581 Secondary hyperparathyroidism of renal origin: Secondary | ICD-10-CM | POA: Diagnosis not present

## 2017-11-04 DIAGNOSIS — N186 End stage renal disease: Secondary | ICD-10-CM | POA: Diagnosis not present

## 2017-11-04 DIAGNOSIS — N2581 Secondary hyperparathyroidism of renal origin: Secondary | ICD-10-CM | POA: Diagnosis not present

## 2017-11-04 DIAGNOSIS — D631 Anemia in chronic kidney disease: Secondary | ICD-10-CM | POA: Diagnosis not present

## 2017-11-05 DIAGNOSIS — I214 Non-ST elevation (NSTEMI) myocardial infarction: Secondary | ICD-10-CM | POA: Diagnosis not present

## 2017-11-05 DIAGNOSIS — I10 Essential (primary) hypertension: Secondary | ICD-10-CM | POA: Diagnosis not present

## 2017-11-05 DIAGNOSIS — I2510 Atherosclerotic heart disease of native coronary artery without angina pectoris: Secondary | ICD-10-CM | POA: Diagnosis not present

## 2017-11-06 DIAGNOSIS — D631 Anemia in chronic kidney disease: Secondary | ICD-10-CM | POA: Diagnosis not present

## 2017-11-06 DIAGNOSIS — N186 End stage renal disease: Secondary | ICD-10-CM | POA: Diagnosis not present

## 2017-11-06 DIAGNOSIS — N2581 Secondary hyperparathyroidism of renal origin: Secondary | ICD-10-CM | POA: Diagnosis not present

## 2017-11-07 DIAGNOSIS — T82898A Other specified complication of vascular prosthetic devices, implants and grafts, initial encounter: Secondary | ICD-10-CM | POA: Diagnosis not present

## 2017-11-07 DIAGNOSIS — T82858A Stenosis of vascular prosthetic devices, implants and grafts, initial encounter: Secondary | ICD-10-CM | POA: Diagnosis not present

## 2017-11-07 DIAGNOSIS — Z992 Dependence on renal dialysis: Secondary | ICD-10-CM | POA: Diagnosis not present

## 2017-11-07 DIAGNOSIS — N186 End stage renal disease: Secondary | ICD-10-CM | POA: Diagnosis not present

## 2017-11-07 DIAGNOSIS — I871 Compression of vein: Secondary | ICD-10-CM | POA: Diagnosis not present

## 2017-11-07 DIAGNOSIS — T82590A Other mechanical complication of surgically created arteriovenous fistula, initial encounter: Secondary | ICD-10-CM | POA: Diagnosis not present

## 2017-11-08 DIAGNOSIS — N186 End stage renal disease: Secondary | ICD-10-CM | POA: Diagnosis not present

## 2017-11-08 DIAGNOSIS — N2581 Secondary hyperparathyroidism of renal origin: Secondary | ICD-10-CM | POA: Diagnosis not present

## 2017-11-08 DIAGNOSIS — D631 Anemia in chronic kidney disease: Secondary | ICD-10-CM | POA: Diagnosis not present

## 2017-11-09 DIAGNOSIS — I471 Supraventricular tachycardia: Secondary | ICD-10-CM | POA: Diagnosis not present

## 2017-11-09 DIAGNOSIS — I5023 Acute on chronic systolic (congestive) heart failure: Secondary | ICD-10-CM | POA: Diagnosis not present

## 2017-11-09 DIAGNOSIS — R000 Tachycardia, unspecified: Secondary | ICD-10-CM | POA: Diagnosis not present

## 2017-11-09 DIAGNOSIS — I25810 Atherosclerosis of coronary artery bypass graft(s) without angina pectoris: Secondary | ICD-10-CM | POA: Diagnosis not present

## 2017-11-09 DIAGNOSIS — I132 Hypertensive heart and chronic kidney disease with heart failure and with stage 5 chronic kidney disease, or end stage renal disease: Secondary | ICD-10-CM | POA: Diagnosis not present

## 2017-11-09 DIAGNOSIS — N186 End stage renal disease: Secondary | ICD-10-CM | POA: Diagnosis not present

## 2017-11-09 DIAGNOSIS — R079 Chest pain, unspecified: Secondary | ICD-10-CM | POA: Diagnosis not present

## 2017-11-09 DIAGNOSIS — I255 Ischemic cardiomyopathy: Secondary | ICD-10-CM | POA: Diagnosis not present

## 2017-11-09 DIAGNOSIS — Z743 Need for continuous supervision: Secondary | ICD-10-CM | POA: Diagnosis not present

## 2017-11-09 DIAGNOSIS — I10 Essential (primary) hypertension: Secondary | ICD-10-CM | POA: Diagnosis not present

## 2017-11-09 DIAGNOSIS — J9601 Acute respiratory failure with hypoxia: Secondary | ICD-10-CM | POA: Diagnosis not present

## 2017-11-09 DIAGNOSIS — J90 Pleural effusion, not elsewhere classified: Secondary | ICD-10-CM | POA: Diagnosis not present

## 2017-11-09 DIAGNOSIS — R570 Cardiogenic shock: Secondary | ICD-10-CM | POA: Diagnosis not present

## 2017-11-09 DIAGNOSIS — E119 Type 2 diabetes mellitus without complications: Secondary | ICD-10-CM | POA: Diagnosis not present

## 2017-11-10 DIAGNOSIS — Z89429 Acquired absence of other toe(s), unspecified side: Secondary | ICD-10-CM | POA: Diagnosis not present

## 2017-11-10 DIAGNOSIS — Z992 Dependence on renal dialysis: Secondary | ICD-10-CM | POA: Diagnosis not present

## 2017-11-10 DIAGNOSIS — I700 Atherosclerosis of aorta: Secondary | ICD-10-CM | POA: Diagnosis present

## 2017-11-10 DIAGNOSIS — E11649 Type 2 diabetes mellitus with hypoglycemia without coma: Secondary | ICD-10-CM | POA: Diagnosis not present

## 2017-11-10 DIAGNOSIS — E1140 Type 2 diabetes mellitus with diabetic neuropathy, unspecified: Secondary | ICD-10-CM | POA: Diagnosis present

## 2017-11-10 DIAGNOSIS — I5021 Acute systolic (congestive) heart failure: Secondary | ICD-10-CM | POA: Diagnosis not present

## 2017-11-10 DIAGNOSIS — Z794 Long term (current) use of insulin: Secondary | ICD-10-CM | POA: Diagnosis not present

## 2017-11-10 DIAGNOSIS — I10 Essential (primary) hypertension: Secondary | ICD-10-CM | POA: Diagnosis not present

## 2017-11-10 DIAGNOSIS — I340 Nonrheumatic mitral (valve) insufficiency: Secondary | ICD-10-CM | POA: Diagnosis not present

## 2017-11-10 DIAGNOSIS — R079 Chest pain, unspecified: Secondary | ICD-10-CM | POA: Diagnosis not present

## 2017-11-10 DIAGNOSIS — J9601 Acute respiratory failure with hypoxia: Secondary | ICD-10-CM | POA: Diagnosis not present

## 2017-11-10 DIAGNOSIS — Z7982 Long term (current) use of aspirin: Secondary | ICD-10-CM | POA: Diagnosis not present

## 2017-11-10 DIAGNOSIS — Z4659 Encounter for fitting and adjustment of other gastrointestinal appliance and device: Secondary | ICD-10-CM | POA: Diagnosis not present

## 2017-11-10 DIAGNOSIS — I120 Hypertensive chronic kidney disease with stage 5 chronic kidney disease or end stage renal disease: Secondary | ICD-10-CM | POA: Diagnosis not present

## 2017-11-10 DIAGNOSIS — Z452 Encounter for adjustment and management of vascular access device: Secondary | ICD-10-CM | POA: Diagnosis not present

## 2017-11-10 DIAGNOSIS — I255 Ischemic cardiomyopathy: Secondary | ICD-10-CM | POA: Diagnosis not present

## 2017-11-10 DIAGNOSIS — R570 Cardiogenic shock: Secondary | ICD-10-CM | POA: Diagnosis not present

## 2017-11-10 DIAGNOSIS — I509 Heart failure, unspecified: Secondary | ICD-10-CM | POA: Diagnosis not present

## 2017-11-10 DIAGNOSIS — D631 Anemia in chronic kidney disease: Secondary | ICD-10-CM | POA: Diagnosis present

## 2017-11-10 DIAGNOSIS — I4510 Unspecified right bundle-branch block: Secondary | ICD-10-CM | POA: Diagnosis not present

## 2017-11-10 DIAGNOSIS — E872 Acidosis: Secondary | ICD-10-CM | POA: Diagnosis present

## 2017-11-10 DIAGNOSIS — E8770 Fluid overload, unspecified: Secondary | ICD-10-CM | POA: Diagnosis not present

## 2017-11-10 DIAGNOSIS — I4581 Long QT syndrome: Secondary | ICD-10-CM | POA: Diagnosis not present

## 2017-11-10 DIAGNOSIS — I471 Supraventricular tachycardia: Secondary | ICD-10-CM | POA: Diagnosis present

## 2017-11-10 DIAGNOSIS — I959 Hypotension, unspecified: Secondary | ICD-10-CM | POA: Diagnosis not present

## 2017-11-10 DIAGNOSIS — Z951 Presence of aortocoronary bypass graft: Secondary | ICD-10-CM | POA: Diagnosis not present

## 2017-11-10 DIAGNOSIS — J9811 Atelectasis: Secondary | ICD-10-CM | POA: Diagnosis not present

## 2017-11-10 DIAGNOSIS — N186 End stage renal disease: Secondary | ICD-10-CM | POA: Diagnosis not present

## 2017-11-10 DIAGNOSIS — I2510 Atherosclerotic heart disease of native coronary artery without angina pectoris: Secondary | ICD-10-CM | POA: Diagnosis not present

## 2017-11-10 DIAGNOSIS — I493 Ventricular premature depolarization: Secondary | ICD-10-CM | POA: Diagnosis present

## 2017-11-10 DIAGNOSIS — I5023 Acute on chronic systolic (congestive) heart failure: Secondary | ICD-10-CM | POA: Diagnosis not present

## 2017-11-10 DIAGNOSIS — J811 Chronic pulmonary edema: Secondary | ICD-10-CM | POA: Diagnosis not present

## 2017-11-10 DIAGNOSIS — I25810 Atherosclerosis of coronary artery bypass graft(s) without angina pectoris: Secondary | ICD-10-CM | POA: Diagnosis not present

## 2017-11-10 DIAGNOSIS — I132 Hypertensive heart and chronic kidney disease with heart failure and with stage 5 chronic kidney disease, or end stage renal disease: Secondary | ICD-10-CM | POA: Diagnosis not present

## 2017-11-10 DIAGNOSIS — J90 Pleural effusion, not elsewhere classified: Secondary | ICD-10-CM | POA: Diagnosis not present

## 2017-11-10 DIAGNOSIS — I272 Pulmonary hypertension, unspecified: Secondary | ICD-10-CM | POA: Diagnosis present

## 2017-11-10 DIAGNOSIS — E1122 Type 2 diabetes mellitus with diabetic chronic kidney disease: Secondary | ICD-10-CM | POA: Diagnosis not present

## 2017-11-10 DIAGNOSIS — I081 Rheumatic disorders of both mitral and tricuspid valves: Secondary | ICD-10-CM | POA: Diagnosis present

## 2017-11-10 DIAGNOSIS — Z86718 Personal history of other venous thrombosis and embolism: Secondary | ICD-10-CM | POA: Diagnosis not present

## 2017-11-18 DIAGNOSIS — N2581 Secondary hyperparathyroidism of renal origin: Secondary | ICD-10-CM | POA: Diagnosis not present

## 2017-11-18 DIAGNOSIS — D631 Anemia in chronic kidney disease: Secondary | ICD-10-CM | POA: Diagnosis not present

## 2017-11-18 DIAGNOSIS — N186 End stage renal disease: Secondary | ICD-10-CM | POA: Diagnosis not present

## 2017-11-20 DIAGNOSIS — N2581 Secondary hyperparathyroidism of renal origin: Secondary | ICD-10-CM | POA: Diagnosis not present

## 2017-11-20 DIAGNOSIS — D631 Anemia in chronic kidney disease: Secondary | ICD-10-CM | POA: Diagnosis not present

## 2017-11-20 DIAGNOSIS — N186 End stage renal disease: Secondary | ICD-10-CM | POA: Diagnosis not present

## 2017-11-22 DIAGNOSIS — E1122 Type 2 diabetes mellitus with diabetic chronic kidney disease: Secondary | ICD-10-CM | POA: Diagnosis present

## 2017-11-22 DIAGNOSIS — D638 Anemia in other chronic diseases classified elsewhere: Secondary | ICD-10-CM | POA: Diagnosis present

## 2017-11-22 DIAGNOSIS — I2510 Atherosclerotic heart disease of native coronary artery without angina pectoris: Secondary | ICD-10-CM | POA: Diagnosis not present

## 2017-11-22 DIAGNOSIS — Z7982 Long term (current) use of aspirin: Secondary | ICD-10-CM | POA: Diagnosis not present

## 2017-11-22 DIAGNOSIS — J90 Pleural effusion, not elsewhere classified: Secondary | ICD-10-CM | POA: Diagnosis not present

## 2017-11-22 DIAGNOSIS — N186 End stage renal disease: Secondary | ICD-10-CM | POA: Diagnosis not present

## 2017-11-22 DIAGNOSIS — Z743 Need for continuous supervision: Secondary | ICD-10-CM | POA: Diagnosis not present

## 2017-11-22 DIAGNOSIS — E785 Hyperlipidemia, unspecified: Secondary | ICD-10-CM | POA: Diagnosis present

## 2017-11-22 DIAGNOSIS — I340 Nonrheumatic mitral (valve) insufficiency: Secondary | ICD-10-CM | POA: Diagnosis present

## 2017-11-22 DIAGNOSIS — Z89429 Acquired absence of other toe(s), unspecified side: Secondary | ICD-10-CM | POA: Diagnosis not present

## 2017-11-22 DIAGNOSIS — Z955 Presence of coronary angioplasty implant and graft: Secondary | ICD-10-CM | POA: Diagnosis not present

## 2017-11-22 DIAGNOSIS — Z79899 Other long term (current) drug therapy: Secondary | ICD-10-CM | POA: Diagnosis not present

## 2017-11-22 DIAGNOSIS — J811 Chronic pulmonary edema: Secondary | ICD-10-CM | POA: Diagnosis not present

## 2017-11-22 DIAGNOSIS — Z86711 Personal history of pulmonary embolism: Secondary | ICD-10-CM | POA: Diagnosis not present

## 2017-11-22 DIAGNOSIS — Z7902 Long term (current) use of antithrombotics/antiplatelets: Secondary | ICD-10-CM | POA: Diagnosis not present

## 2017-11-22 DIAGNOSIS — Z7984 Long term (current) use of oral hypoglycemic drugs: Secondary | ICD-10-CM | POA: Diagnosis not present

## 2017-11-22 DIAGNOSIS — J449 Chronic obstructive pulmonary disease, unspecified: Secondary | ICD-10-CM | POA: Diagnosis present

## 2017-11-22 DIAGNOSIS — I493 Ventricular premature depolarization: Secondary | ICD-10-CM | POA: Diagnosis not present

## 2017-11-22 DIAGNOSIS — Z951 Presence of aortocoronary bypass graft: Secondary | ICD-10-CM | POA: Diagnosis not present

## 2017-11-22 DIAGNOSIS — I255 Ischemic cardiomyopathy: Secondary | ICD-10-CM | POA: Diagnosis present

## 2017-11-22 DIAGNOSIS — R079 Chest pain, unspecified: Secondary | ICD-10-CM | POA: Diagnosis not present

## 2017-11-22 DIAGNOSIS — I5023 Acute on chronic systolic (congestive) heart failure: Secondary | ICD-10-CM | POA: Diagnosis not present

## 2017-11-22 DIAGNOSIS — Z9981 Dependence on supplemental oxygen: Secondary | ICD-10-CM | POA: Diagnosis not present

## 2017-11-22 DIAGNOSIS — Z992 Dependence on renal dialysis: Secondary | ICD-10-CM | POA: Diagnosis not present

## 2017-11-22 DIAGNOSIS — I132 Hypertensive heart and chronic kidney disease with heart failure and with stage 5 chronic kidney disease, or end stage renal disease: Secondary | ICD-10-CM | POA: Diagnosis not present

## 2017-11-22 DIAGNOSIS — I4581 Long QT syndrome: Secondary | ICD-10-CM | POA: Diagnosis present

## 2017-11-22 DIAGNOSIS — I472 Ventricular tachycardia: Secondary | ICD-10-CM | POA: Diagnosis present

## 2017-11-22 DIAGNOSIS — I10 Essential (primary) hypertension: Secondary | ICD-10-CM | POA: Diagnosis not present

## 2017-11-22 DIAGNOSIS — I272 Pulmonary hypertension, unspecified: Secondary | ICD-10-CM | POA: Diagnosis present

## 2017-11-25 DIAGNOSIS — D631 Anemia in chronic kidney disease: Secondary | ICD-10-CM | POA: Diagnosis not present

## 2017-11-25 DIAGNOSIS — N186 End stage renal disease: Secondary | ICD-10-CM | POA: Diagnosis not present

## 2017-11-25 DIAGNOSIS — N2581 Secondary hyperparathyroidism of renal origin: Secondary | ICD-10-CM | POA: Diagnosis not present

## 2017-11-26 DIAGNOSIS — I5022 Chronic systolic (congestive) heart failure: Secondary | ICD-10-CM | POA: Diagnosis not present

## 2017-11-26 DIAGNOSIS — I255 Ischemic cardiomyopathy: Secondary | ICD-10-CM | POA: Diagnosis not present

## 2017-11-26 DIAGNOSIS — I2510 Atherosclerotic heart disease of native coronary artery without angina pectoris: Secondary | ICD-10-CM | POA: Diagnosis not present

## 2017-11-26 DIAGNOSIS — I214 Non-ST elevation (NSTEMI) myocardial infarction: Secondary | ICD-10-CM | POA: Diagnosis not present

## 2017-11-27 DIAGNOSIS — N186 End stage renal disease: Secondary | ICD-10-CM | POA: Diagnosis not present

## 2017-11-27 DIAGNOSIS — N2581 Secondary hyperparathyroidism of renal origin: Secondary | ICD-10-CM | POA: Diagnosis not present

## 2017-11-27 DIAGNOSIS — D631 Anemia in chronic kidney disease: Secondary | ICD-10-CM | POA: Diagnosis not present

## 2017-11-29 DIAGNOSIS — D631 Anemia in chronic kidney disease: Secondary | ICD-10-CM | POA: Diagnosis not present

## 2017-11-29 DIAGNOSIS — N2581 Secondary hyperparathyroidism of renal origin: Secondary | ICD-10-CM | POA: Diagnosis not present

## 2017-11-29 DIAGNOSIS — N186 End stage renal disease: Secondary | ICD-10-CM | POA: Diagnosis not present

## 2017-12-01 DIAGNOSIS — N186 End stage renal disease: Secondary | ICD-10-CM | POA: Diagnosis not present

## 2017-12-02 DIAGNOSIS — N186 End stage renal disease: Secondary | ICD-10-CM | POA: Diagnosis not present

## 2017-12-02 DIAGNOSIS — N2581 Secondary hyperparathyroidism of renal origin: Secondary | ICD-10-CM | POA: Diagnosis not present

## 2017-12-02 DIAGNOSIS — D631 Anemia in chronic kidney disease: Secondary | ICD-10-CM | POA: Diagnosis not present

## 2017-12-04 DIAGNOSIS — N186 End stage renal disease: Secondary | ICD-10-CM | POA: Diagnosis not present

## 2017-12-04 DIAGNOSIS — D631 Anemia in chronic kidney disease: Secondary | ICD-10-CM | POA: Diagnosis not present

## 2017-12-04 DIAGNOSIS — N2581 Secondary hyperparathyroidism of renal origin: Secondary | ICD-10-CM | POA: Diagnosis not present

## 2017-12-06 DIAGNOSIS — D631 Anemia in chronic kidney disease: Secondary | ICD-10-CM | POA: Diagnosis not present

## 2017-12-06 DIAGNOSIS — N2581 Secondary hyperparathyroidism of renal origin: Secondary | ICD-10-CM | POA: Diagnosis not present

## 2017-12-06 DIAGNOSIS — N186 End stage renal disease: Secondary | ICD-10-CM | POA: Diagnosis not present

## 2017-12-09 DIAGNOSIS — E875 Hyperkalemia: Secondary | ICD-10-CM | POA: Diagnosis present

## 2017-12-09 DIAGNOSIS — I4581 Long QT syndrome: Secondary | ICD-10-CM | POA: Diagnosis present

## 2017-12-09 DIAGNOSIS — I120 Hypertensive chronic kidney disease with stage 5 chronic kidney disease or end stage renal disease: Secondary | ICD-10-CM | POA: Diagnosis not present

## 2017-12-09 DIAGNOSIS — E11649 Type 2 diabetes mellitus with hypoglycemia without coma: Secondary | ICD-10-CM | POA: Diagnosis not present

## 2017-12-09 DIAGNOSIS — Z7984 Long term (current) use of oral hypoglycemic drugs: Secondary | ICD-10-CM | POA: Diagnosis not present

## 2017-12-09 DIAGNOSIS — I272 Pulmonary hypertension, unspecified: Secondary | ICD-10-CM | POA: Diagnosis present

## 2017-12-09 DIAGNOSIS — I5021 Acute systolic (congestive) heart failure: Secondary | ICD-10-CM | POA: Diagnosis not present

## 2017-12-09 DIAGNOSIS — I959 Hypotension, unspecified: Secondary | ICD-10-CM | POA: Diagnosis not present

## 2017-12-09 DIAGNOSIS — Z955 Presence of coronary angioplasty implant and graft: Secondary | ICD-10-CM | POA: Diagnosis not present

## 2017-12-09 DIAGNOSIS — Z4682 Encounter for fitting and adjustment of non-vascular catheter: Secondary | ICD-10-CM | POA: Diagnosis not present

## 2017-12-09 DIAGNOSIS — Z89412 Acquired absence of left great toe: Secondary | ICD-10-CM | POA: Diagnosis not present

## 2017-12-09 DIAGNOSIS — D638 Anemia in other chronic diseases classified elsewhere: Secondary | ICD-10-CM | POA: Diagnosis present

## 2017-12-09 DIAGNOSIS — I255 Ischemic cardiomyopathy: Secondary | ICD-10-CM | POA: Diagnosis not present

## 2017-12-09 DIAGNOSIS — I5023 Acute on chronic systolic (congestive) heart failure: Secondary | ICD-10-CM | POA: Diagnosis not present

## 2017-12-09 DIAGNOSIS — J449 Chronic obstructive pulmonary disease, unspecified: Secondary | ICD-10-CM | POA: Diagnosis present

## 2017-12-09 DIAGNOSIS — E1151 Type 2 diabetes mellitus with diabetic peripheral angiopathy without gangrene: Secondary | ICD-10-CM | POA: Diagnosis present

## 2017-12-09 DIAGNOSIS — R4781 Slurred speech: Secondary | ICD-10-CM | POA: Diagnosis not present

## 2017-12-09 DIAGNOSIS — R570 Cardiogenic shock: Secondary | ICD-10-CM | POA: Diagnosis not present

## 2017-12-09 DIAGNOSIS — Z86718 Personal history of other venous thrombosis and embolism: Secondary | ICD-10-CM | POA: Diagnosis not present

## 2017-12-09 DIAGNOSIS — I471 Supraventricular tachycardia: Secondary | ICD-10-CM | POA: Diagnosis present

## 2017-12-09 DIAGNOSIS — E1122 Type 2 diabetes mellitus with diabetic chronic kidney disease: Secondary | ICD-10-CM | POA: Diagnosis present

## 2017-12-09 DIAGNOSIS — J90 Pleural effusion, not elsewhere classified: Secondary | ICD-10-CM | POA: Diagnosis not present

## 2017-12-09 DIAGNOSIS — I472 Ventricular tachycardia: Secondary | ICD-10-CM | POA: Diagnosis not present

## 2017-12-09 DIAGNOSIS — E872 Acidosis: Secondary | ICD-10-CM | POA: Diagnosis present

## 2017-12-09 DIAGNOSIS — R112 Nausea with vomiting, unspecified: Secondary | ICD-10-CM | POA: Diagnosis not present

## 2017-12-09 DIAGNOSIS — J9601 Acute respiratory failure with hypoxia: Secondary | ICD-10-CM | POA: Diagnosis not present

## 2017-12-09 DIAGNOSIS — G9341 Metabolic encephalopathy: Secondary | ICD-10-CM | POA: Diagnosis not present

## 2017-12-09 DIAGNOSIS — I132 Hypertensive heart and chronic kidney disease with heart failure and with stage 5 chronic kidney disease, or end stage renal disease: Secondary | ICD-10-CM | POA: Diagnosis not present

## 2017-12-09 DIAGNOSIS — J9690 Respiratory failure, unspecified, unspecified whether with hypoxia or hypercapnia: Secondary | ICD-10-CM | POA: Diagnosis not present

## 2017-12-09 DIAGNOSIS — Z951 Presence of aortocoronary bypass graft: Secondary | ICD-10-CM | POA: Diagnosis not present

## 2017-12-09 DIAGNOSIS — E785 Hyperlipidemia, unspecified: Secondary | ICD-10-CM | POA: Diagnosis present

## 2017-12-09 DIAGNOSIS — I509 Heart failure, unspecified: Secondary | ICD-10-CM | POA: Diagnosis not present

## 2017-12-09 DIAGNOSIS — I361 Nonrheumatic tricuspid (valve) insufficiency: Secondary | ICD-10-CM | POA: Diagnosis not present

## 2017-12-09 DIAGNOSIS — N186 End stage renal disease: Secondary | ICD-10-CM | POA: Diagnosis not present

## 2017-12-09 DIAGNOSIS — R079 Chest pain, unspecified: Secondary | ICD-10-CM | POA: Diagnosis not present

## 2017-12-09 DIAGNOSIS — I340 Nonrheumatic mitral (valve) insufficiency: Secondary | ICD-10-CM | POA: Diagnosis not present

## 2017-12-09 DIAGNOSIS — J811 Chronic pulmonary edema: Secondary | ICD-10-CM | POA: Diagnosis not present

## 2017-12-09 DIAGNOSIS — R0789 Other chest pain: Secondary | ICD-10-CM | POA: Diagnosis not present

## 2017-12-09 DIAGNOSIS — Z743 Need for continuous supervision: Secondary | ICD-10-CM | POA: Diagnosis not present

## 2017-12-09 DIAGNOSIS — Z8674 Personal history of sudden cardiac arrest: Secondary | ICD-10-CM | POA: Diagnosis not present

## 2017-12-09 DIAGNOSIS — I469 Cardiac arrest, cause unspecified: Secondary | ICD-10-CM | POA: Diagnosis not present

## 2017-12-09 DIAGNOSIS — I2510 Atherosclerotic heart disease of native coronary artery without angina pectoris: Secondary | ICD-10-CM | POA: Diagnosis present

## 2017-12-18 ENCOUNTER — Ambulatory Visit: Payer: Self-pay | Admitting: Cardiology

## 2017-12-18 DIAGNOSIS — D631 Anemia in chronic kidney disease: Secondary | ICD-10-CM | POA: Diagnosis not present

## 2017-12-18 DIAGNOSIS — N186 End stage renal disease: Secondary | ICD-10-CM | POA: Diagnosis not present

## 2017-12-18 DIAGNOSIS — N2581 Secondary hyperparathyroidism of renal origin: Secondary | ICD-10-CM | POA: Diagnosis not present

## 2017-12-20 DIAGNOSIS — I5022 Chronic systolic (congestive) heart failure: Secondary | ICD-10-CM | POA: Diagnosis not present

## 2017-12-20 DIAGNOSIS — I255 Ischemic cardiomyopathy: Secondary | ICD-10-CM | POA: Diagnosis not present

## 2017-12-20 DIAGNOSIS — N186 End stage renal disease: Secondary | ICD-10-CM | POA: Diagnosis not present

## 2017-12-20 DIAGNOSIS — D631 Anemia in chronic kidney disease: Secondary | ICD-10-CM | POA: Diagnosis not present

## 2017-12-20 DIAGNOSIS — I340 Nonrheumatic mitral (valve) insufficiency: Secondary | ICD-10-CM | POA: Diagnosis not present

## 2017-12-20 DIAGNOSIS — I071 Rheumatic tricuspid insufficiency: Secondary | ICD-10-CM | POA: Diagnosis not present

## 2017-12-20 DIAGNOSIS — N2581 Secondary hyperparathyroidism of renal origin: Secondary | ICD-10-CM | POA: Diagnosis not present

## 2017-12-21 DIAGNOSIS — I132 Hypertensive heart and chronic kidney disease with heart failure and with stage 5 chronic kidney disease, or end stage renal disease: Secondary | ICD-10-CM | POA: Diagnosis not present

## 2017-12-21 DIAGNOSIS — J90 Pleural effusion, not elsewhere classified: Secondary | ICD-10-CM | POA: Diagnosis not present

## 2017-12-21 DIAGNOSIS — N186 End stage renal disease: Secondary | ICD-10-CM | POA: Diagnosis not present

## 2017-12-21 DIAGNOSIS — E8770 Fluid overload, unspecified: Secondary | ICD-10-CM | POA: Diagnosis not present

## 2017-12-21 DIAGNOSIS — M7918 Myalgia, other site: Secondary | ICD-10-CM | POA: Diagnosis not present

## 2017-12-21 DIAGNOSIS — I5023 Acute on chronic systolic (congestive) heart failure: Secondary | ICD-10-CM | POA: Diagnosis not present

## 2017-12-21 DIAGNOSIS — N2581 Secondary hyperparathyroidism of renal origin: Secondary | ICD-10-CM | POA: Diagnosis not present

## 2017-12-21 DIAGNOSIS — R0789 Other chest pain: Secondary | ICD-10-CM | POA: Diagnosis not present

## 2017-12-22 DIAGNOSIS — I255 Ischemic cardiomyopathy: Secondary | ICD-10-CM | POA: Diagnosis not present

## 2017-12-22 DIAGNOSIS — E8770 Fluid overload, unspecified: Secondary | ICD-10-CM | POA: Diagnosis not present

## 2017-12-22 DIAGNOSIS — I5023 Acute on chronic systolic (congestive) heart failure: Secondary | ICD-10-CM | POA: Diagnosis not present

## 2017-12-22 DIAGNOSIS — R0789 Other chest pain: Secondary | ICD-10-CM | POA: Diagnosis not present

## 2017-12-22 DIAGNOSIS — I348 Other nonrheumatic mitral valve disorders: Secondary | ICD-10-CM | POA: Diagnosis not present

## 2017-12-22 DIAGNOSIS — R079 Chest pain, unspecified: Secondary | ICD-10-CM | POA: Diagnosis not present

## 2017-12-23 DIAGNOSIS — Z992 Dependence on renal dialysis: Secondary | ICD-10-CM | POA: Diagnosis not present

## 2017-12-23 DIAGNOSIS — Z7984 Long term (current) use of oral hypoglycemic drugs: Secondary | ICD-10-CM | POA: Diagnosis not present

## 2017-12-23 DIAGNOSIS — R0789 Other chest pain: Secondary | ICD-10-CM | POA: Diagnosis not present

## 2017-12-23 DIAGNOSIS — E8770 Fluid overload, unspecified: Secondary | ICD-10-CM | POA: Diagnosis not present

## 2017-12-23 DIAGNOSIS — N186 End stage renal disease: Secondary | ICD-10-CM | POA: Diagnosis not present

## 2017-12-23 DIAGNOSIS — Z79899 Other long term (current) drug therapy: Secondary | ICD-10-CM | POA: Diagnosis not present

## 2017-12-23 DIAGNOSIS — I2510 Atherosclerotic heart disease of native coronary artery without angina pectoris: Secondary | ICD-10-CM | POA: Diagnosis present

## 2017-12-23 DIAGNOSIS — E785 Hyperlipidemia, unspecified: Secondary | ICD-10-CM | POA: Diagnosis present

## 2017-12-23 DIAGNOSIS — N2581 Secondary hyperparathyroidism of renal origin: Secondary | ICD-10-CM | POA: Diagnosis present

## 2017-12-23 DIAGNOSIS — I129 Hypertensive chronic kidney disease with stage 1 through stage 4 chronic kidney disease, or unspecified chronic kidney disease: Secondary | ICD-10-CM | POA: Diagnosis not present

## 2017-12-23 DIAGNOSIS — Z7901 Long term (current) use of anticoagulants: Secondary | ICD-10-CM | POA: Diagnosis not present

## 2017-12-23 DIAGNOSIS — E1122 Type 2 diabetes mellitus with diabetic chronic kidney disease: Secondary | ICD-10-CM | POA: Diagnosis not present

## 2017-12-23 DIAGNOSIS — I255 Ischemic cardiomyopathy: Secondary | ICD-10-CM | POA: Diagnosis present

## 2017-12-23 DIAGNOSIS — I5023 Acute on chronic systolic (congestive) heart failure: Secondary | ICD-10-CM | POA: Diagnosis present

## 2017-12-23 DIAGNOSIS — Z7982 Long term (current) use of aspirin: Secondary | ICD-10-CM | POA: Diagnosis not present

## 2017-12-23 DIAGNOSIS — Z87891 Personal history of nicotine dependence: Secondary | ICD-10-CM | POA: Diagnosis not present

## 2017-12-23 DIAGNOSIS — Z86718 Personal history of other venous thrombosis and embolism: Secondary | ICD-10-CM | POA: Diagnosis not present

## 2017-12-23 DIAGNOSIS — I340 Nonrheumatic mitral (valve) insufficiency: Secondary | ICD-10-CM | POA: Diagnosis present

## 2017-12-23 DIAGNOSIS — D631 Anemia in chronic kidney disease: Secondary | ICD-10-CM | POA: Diagnosis not present

## 2017-12-23 DIAGNOSIS — I132 Hypertensive heart and chronic kidney disease with heart failure and with stage 5 chronic kidney disease, or end stage renal disease: Secondary | ICD-10-CM | POA: Diagnosis present

## 2017-12-23 DIAGNOSIS — M7918 Myalgia, other site: Secondary | ICD-10-CM | POA: Diagnosis present

## 2017-12-25 DIAGNOSIS — D631 Anemia in chronic kidney disease: Secondary | ICD-10-CM | POA: Diagnosis not present

## 2017-12-25 DIAGNOSIS — N2581 Secondary hyperparathyroidism of renal origin: Secondary | ICD-10-CM | POA: Diagnosis not present

## 2017-12-25 DIAGNOSIS — N186 End stage renal disease: Secondary | ICD-10-CM | POA: Diagnosis not present

## 2017-12-26 DIAGNOSIS — I5022 Chronic systolic (congestive) heart failure: Secondary | ICD-10-CM | POA: Diagnosis not present

## 2017-12-26 DIAGNOSIS — I272 Pulmonary hypertension, unspecified: Secondary | ICD-10-CM | POA: Diagnosis not present

## 2017-12-26 DIAGNOSIS — I2510 Atherosclerotic heart disease of native coronary artery without angina pectoris: Secondary | ICD-10-CM | POA: Diagnosis not present

## 2017-12-26 DIAGNOSIS — I255 Ischemic cardiomyopathy: Secondary | ICD-10-CM | POA: Diagnosis not present

## 2017-12-27 DIAGNOSIS — N2581 Secondary hyperparathyroidism of renal origin: Secondary | ICD-10-CM | POA: Diagnosis not present

## 2017-12-27 DIAGNOSIS — D631 Anemia in chronic kidney disease: Secondary | ICD-10-CM | POA: Diagnosis not present

## 2017-12-27 DIAGNOSIS — N186 End stage renal disease: Secondary | ICD-10-CM | POA: Diagnosis not present

## 2017-12-30 ENCOUNTER — Ambulatory Visit: Payer: Self-pay | Admitting: Cardiology

## 2017-12-30 DIAGNOSIS — N2581 Secondary hyperparathyroidism of renal origin: Secondary | ICD-10-CM | POA: Diagnosis not present

## 2017-12-30 DIAGNOSIS — N186 End stage renal disease: Secondary | ICD-10-CM | POA: Diagnosis not present

## 2017-12-30 DIAGNOSIS — D631 Anemia in chronic kidney disease: Secondary | ICD-10-CM | POA: Diagnosis not present

## 2017-12-31 DIAGNOSIS — N186 End stage renal disease: Secondary | ICD-10-CM | POA: Diagnosis not present

## 2017-12-31 DIAGNOSIS — R079 Chest pain, unspecified: Secondary | ICD-10-CM | POA: Diagnosis not present

## 2018-01-01 DIAGNOSIS — N2581 Secondary hyperparathyroidism of renal origin: Secondary | ICD-10-CM | POA: Diagnosis not present

## 2018-01-01 DIAGNOSIS — D631 Anemia in chronic kidney disease: Secondary | ICD-10-CM | POA: Diagnosis not present

## 2018-01-01 DIAGNOSIS — N186 End stage renal disease: Secondary | ICD-10-CM | POA: Diagnosis not present

## 2018-01-02 DIAGNOSIS — I25810 Atherosclerosis of coronary artery bypass graft(s) without angina pectoris: Secondary | ICD-10-CM | POA: Diagnosis not present

## 2018-01-02 DIAGNOSIS — I272 Pulmonary hypertension, unspecified: Secondary | ICD-10-CM | POA: Diagnosis present

## 2018-01-02 DIAGNOSIS — Z89429 Acquired absence of other toe(s), unspecified side: Secondary | ICD-10-CM | POA: Diagnosis not present

## 2018-01-02 DIAGNOSIS — Z8674 Personal history of sudden cardiac arrest: Secondary | ICD-10-CM | POA: Diagnosis not present

## 2018-01-02 DIAGNOSIS — I2510 Atherosclerotic heart disease of native coronary artery without angina pectoris: Secondary | ICD-10-CM | POA: Diagnosis not present

## 2018-01-02 DIAGNOSIS — Z743 Need for continuous supervision: Secondary | ICD-10-CM | POA: Diagnosis not present

## 2018-01-02 DIAGNOSIS — Z7901 Long term (current) use of anticoagulants: Secondary | ICD-10-CM | POA: Diagnosis not present

## 2018-01-02 DIAGNOSIS — E1122 Type 2 diabetes mellitus with diabetic chronic kidney disease: Secondary | ICD-10-CM | POA: Diagnosis present

## 2018-01-02 DIAGNOSIS — E785 Hyperlipidemia, unspecified: Secondary | ICD-10-CM | POA: Diagnosis present

## 2018-01-02 DIAGNOSIS — R1110 Vomiting, unspecified: Secondary | ICD-10-CM | POA: Diagnosis not present

## 2018-01-02 DIAGNOSIS — I4892 Unspecified atrial flutter: Secondary | ICD-10-CM | POA: Diagnosis not present

## 2018-01-02 DIAGNOSIS — Z86718 Personal history of other venous thrombosis and embolism: Secondary | ICD-10-CM | POA: Diagnosis not present

## 2018-01-02 DIAGNOSIS — I471 Supraventricular tachycardia: Secondary | ICD-10-CM | POA: Diagnosis not present

## 2018-01-02 DIAGNOSIS — R0789 Other chest pain: Secondary | ICD-10-CM | POA: Diagnosis present

## 2018-01-02 DIAGNOSIS — I5022 Chronic systolic (congestive) heart failure: Secondary | ICD-10-CM | POA: Diagnosis not present

## 2018-01-02 DIAGNOSIS — I509 Heart failure, unspecified: Secondary | ICD-10-CM | POA: Diagnosis not present

## 2018-01-02 DIAGNOSIS — I340 Nonrheumatic mitral (valve) insufficiency: Secondary | ICD-10-CM | POA: Diagnosis not present

## 2018-01-02 DIAGNOSIS — Z79899 Other long term (current) drug therapy: Secondary | ICD-10-CM | POA: Diagnosis not present

## 2018-01-02 DIAGNOSIS — Z992 Dependence on renal dialysis: Secondary | ICD-10-CM | POA: Diagnosis not present

## 2018-01-02 DIAGNOSIS — Z7902 Long term (current) use of antithrombotics/antiplatelets: Secondary | ICD-10-CM | POA: Diagnosis not present

## 2018-01-02 DIAGNOSIS — D631 Anemia in chronic kidney disease: Secondary | ICD-10-CM | POA: Diagnosis present

## 2018-01-02 DIAGNOSIS — E872 Acidosis: Secondary | ICD-10-CM | POA: Diagnosis not present

## 2018-01-02 DIAGNOSIS — Z7982 Long term (current) use of aspirin: Secondary | ICD-10-CM | POA: Diagnosis not present

## 2018-01-02 DIAGNOSIS — I255 Ischemic cardiomyopathy: Secondary | ICD-10-CM | POA: Diagnosis present

## 2018-01-02 DIAGNOSIS — Z87891 Personal history of nicotine dependence: Secondary | ICD-10-CM | POA: Diagnosis not present

## 2018-01-02 DIAGNOSIS — G8929 Other chronic pain: Secondary | ICD-10-CM | POA: Diagnosis present

## 2018-01-02 DIAGNOSIS — R918 Other nonspecific abnormal finding of lung field: Secondary | ICD-10-CM | POA: Diagnosis present

## 2018-01-02 DIAGNOSIS — N186 End stage renal disease: Secondary | ICD-10-CM | POA: Diagnosis present

## 2018-01-02 DIAGNOSIS — R079 Chest pain, unspecified: Secondary | ICD-10-CM | POA: Diagnosis not present

## 2018-01-02 DIAGNOSIS — I132 Hypertensive heart and chronic kidney disease with heart failure and with stage 5 chronic kidney disease, or end stage renal disease: Secondary | ICD-10-CM | POA: Diagnosis not present

## 2018-01-02 DIAGNOSIS — D72829 Elevated white blood cell count, unspecified: Secondary | ICD-10-CM | POA: Diagnosis present

## 2018-01-02 DIAGNOSIS — I5020 Unspecified systolic (congestive) heart failure: Secondary | ICD-10-CM | POA: Diagnosis not present

## 2018-01-02 DIAGNOSIS — Z955 Presence of coronary angioplasty implant and graft: Secondary | ICD-10-CM | POA: Diagnosis not present

## 2018-01-06 DIAGNOSIS — N186 End stage renal disease: Secondary | ICD-10-CM | POA: Diagnosis not present

## 2018-01-06 DIAGNOSIS — D631 Anemia in chronic kidney disease: Secondary | ICD-10-CM | POA: Diagnosis not present

## 2018-01-06 DIAGNOSIS — N2581 Secondary hyperparathyroidism of renal origin: Secondary | ICD-10-CM | POA: Diagnosis not present

## 2018-01-08 DIAGNOSIS — D631 Anemia in chronic kidney disease: Secondary | ICD-10-CM | POA: Diagnosis not present

## 2018-01-08 DIAGNOSIS — N2581 Secondary hyperparathyroidism of renal origin: Secondary | ICD-10-CM | POA: Diagnosis not present

## 2018-01-08 DIAGNOSIS — N186 End stage renal disease: Secondary | ICD-10-CM | POA: Diagnosis not present

## 2018-01-09 DIAGNOSIS — I5022 Chronic systolic (congestive) heart failure: Secondary | ICD-10-CM | POA: Diagnosis not present

## 2018-01-09 DIAGNOSIS — Z4682 Encounter for fitting and adjustment of non-vascular catheter: Secondary | ICD-10-CM | POA: Diagnosis not present

## 2018-01-09 DIAGNOSIS — I255 Ischemic cardiomyopathy: Secondary | ICD-10-CM | POA: Diagnosis not present

## 2018-01-09 DIAGNOSIS — K921 Melena: Secondary | ICD-10-CM | POA: Diagnosis not present

## 2018-01-09 DIAGNOSIS — J9600 Acute respiratory failure, unspecified whether with hypoxia or hypercapnia: Secondary | ICD-10-CM | POA: Diagnosis not present

## 2018-01-09 DIAGNOSIS — R710 Precipitous drop in hematocrit: Secondary | ICD-10-CM | POA: Diagnosis not present

## 2018-01-09 DIAGNOSIS — E785 Hyperlipidemia, unspecified: Secondary | ICD-10-CM | POA: Diagnosis present

## 2018-01-09 DIAGNOSIS — J9690 Respiratory failure, unspecified, unspecified whether with hypoxia or hypercapnia: Secondary | ICD-10-CM | POA: Diagnosis not present

## 2018-01-09 DIAGNOSIS — I959 Hypotension, unspecified: Secondary | ICD-10-CM | POA: Diagnosis present

## 2018-01-09 DIAGNOSIS — I200 Unstable angina: Secondary | ICD-10-CM | POA: Diagnosis not present

## 2018-01-09 DIAGNOSIS — Z7982 Long term (current) use of aspirin: Secondary | ICD-10-CM | POA: Diagnosis not present

## 2018-01-09 DIAGNOSIS — D62 Acute posthemorrhagic anemia: Secondary | ICD-10-CM | POA: Diagnosis not present

## 2018-01-09 DIAGNOSIS — I272 Pulmonary hypertension, unspecified: Secondary | ICD-10-CM | POA: Diagnosis not present

## 2018-01-09 DIAGNOSIS — Z955 Presence of coronary angioplasty implant and graft: Secondary | ICD-10-CM | POA: Diagnosis not present

## 2018-01-09 DIAGNOSIS — J984 Other disorders of lung: Secondary | ICD-10-CM | POA: Diagnosis not present

## 2018-01-09 DIAGNOSIS — K226 Gastro-esophageal laceration-hemorrhage syndrome: Secondary | ICD-10-CM | POA: Diagnosis present

## 2018-01-09 DIAGNOSIS — I2510 Atherosclerotic heart disease of native coronary artery without angina pectoris: Secondary | ICD-10-CM | POA: Diagnosis not present

## 2018-01-09 DIAGNOSIS — K922 Gastrointestinal hemorrhage, unspecified: Secondary | ICD-10-CM | POA: Diagnosis not present

## 2018-01-09 DIAGNOSIS — Z743 Need for continuous supervision: Secondary | ICD-10-CM | POA: Diagnosis not present

## 2018-01-09 DIAGNOSIS — I517 Cardiomegaly: Secondary | ICD-10-CM | POA: Diagnosis not present

## 2018-01-09 DIAGNOSIS — E1165 Type 2 diabetes mellitus with hyperglycemia: Secondary | ICD-10-CM | POA: Diagnosis not present

## 2018-01-09 DIAGNOSIS — Z8679 Personal history of other diseases of the circulatory system: Secondary | ICD-10-CM | POA: Diagnosis not present

## 2018-01-09 DIAGNOSIS — Z89429 Acquired absence of other toe(s), unspecified side: Secondary | ICD-10-CM | POA: Diagnosis not present

## 2018-01-09 DIAGNOSIS — F419 Anxiety disorder, unspecified: Secondary | ICD-10-CM | POA: Diagnosis present

## 2018-01-09 DIAGNOSIS — Z992 Dependence on renal dialysis: Secondary | ICD-10-CM | POA: Diagnosis not present

## 2018-01-09 DIAGNOSIS — I2722 Pulmonary hypertension due to left heart disease: Secondary | ICD-10-CM | POA: Diagnosis present

## 2018-01-09 DIAGNOSIS — I132 Hypertensive heart and chronic kidney disease with heart failure and with stage 5 chronic kidney disease, or end stage renal disease: Secondary | ICD-10-CM | POA: Diagnosis not present

## 2018-01-09 DIAGNOSIS — I340 Nonrheumatic mitral (valve) insufficiency: Secondary | ICD-10-CM | POA: Diagnosis not present

## 2018-01-09 DIAGNOSIS — I081 Rheumatic disorders of both mitral and tricuspid valves: Secondary | ICD-10-CM | POA: Diagnosis present

## 2018-01-09 DIAGNOSIS — E1122 Type 2 diabetes mellitus with diabetic chronic kidney disease: Secondary | ICD-10-CM | POA: Diagnosis present

## 2018-01-09 DIAGNOSIS — R1013 Epigastric pain: Secondary | ICD-10-CM | POA: Diagnosis not present

## 2018-01-09 DIAGNOSIS — N186 End stage renal disease: Secondary | ICD-10-CM | POA: Diagnosis not present

## 2018-01-09 DIAGNOSIS — Z951 Presence of aortocoronary bypass graft: Secondary | ICD-10-CM | POA: Diagnosis not present

## 2018-01-09 DIAGNOSIS — I4891 Unspecified atrial fibrillation: Secondary | ICD-10-CM | POA: Diagnosis present

## 2018-01-09 DIAGNOSIS — I509 Heart failure, unspecified: Secondary | ICD-10-CM | POA: Diagnosis not present

## 2018-01-09 DIAGNOSIS — D631 Anemia in chronic kidney disease: Secondary | ICD-10-CM | POA: Diagnosis present

## 2018-01-09 DIAGNOSIS — J9811 Atelectasis: Secondary | ICD-10-CM | POA: Diagnosis not present

## 2018-01-09 DIAGNOSIS — D72829 Elevated white blood cell count, unspecified: Secondary | ICD-10-CM | POA: Diagnosis not present

## 2018-01-09 DIAGNOSIS — I4581 Long QT syndrome: Secondary | ICD-10-CM | POA: Diagnosis present

## 2018-01-09 DIAGNOSIS — M7989 Other specified soft tissue disorders: Secondary | ICD-10-CM | POA: Diagnosis not present

## 2018-01-09 DIAGNOSIS — Z86718 Personal history of other venous thrombosis and embolism: Secondary | ICD-10-CM | POA: Diagnosis not present

## 2018-01-09 DIAGNOSIS — R739 Hyperglycemia, unspecified: Secondary | ICD-10-CM | POA: Diagnosis not present

## 2018-01-09 DIAGNOSIS — J90 Pleural effusion, not elsewhere classified: Secondary | ICD-10-CM | POA: Diagnosis not present

## 2018-01-09 DIAGNOSIS — Z8674 Personal history of sudden cardiac arrest: Secondary | ICD-10-CM | POA: Diagnosis not present

## 2018-01-09 DIAGNOSIS — K920 Hematemesis: Secondary | ICD-10-CM | POA: Diagnosis not present

## 2018-01-09 DIAGNOSIS — I10 Essential (primary) hypertension: Secondary | ICD-10-CM | POA: Diagnosis not present

## 2018-01-17 DIAGNOSIS — Z7982 Long term (current) use of aspirin: Secondary | ICD-10-CM | POA: Diagnosis not present

## 2018-01-17 DIAGNOSIS — I272 Pulmonary hypertension, unspecified: Secondary | ICD-10-CM | POA: Diagnosis not present

## 2018-01-17 DIAGNOSIS — I255 Ischemic cardiomyopathy: Secondary | ICD-10-CM | POA: Diagnosis not present

## 2018-01-17 DIAGNOSIS — Z992 Dependence on renal dialysis: Secondary | ICD-10-CM | POA: Diagnosis not present

## 2018-01-17 DIAGNOSIS — N186 End stage renal disease: Secondary | ICD-10-CM | POA: Diagnosis not present

## 2018-01-17 DIAGNOSIS — I132 Hypertensive heart and chronic kidney disease with heart failure and with stage 5 chronic kidney disease, or end stage renal disease: Secondary | ICD-10-CM | POA: Diagnosis not present

## 2018-01-17 DIAGNOSIS — I2510 Atherosclerotic heart disease of native coronary artery without angina pectoris: Secondary | ICD-10-CM | POA: Diagnosis not present

## 2018-01-17 DIAGNOSIS — I5022 Chronic systolic (congestive) heart failure: Secondary | ICD-10-CM | POA: Diagnosis not present

## 2018-01-17 DIAGNOSIS — N2581 Secondary hyperparathyroidism of renal origin: Secondary | ICD-10-CM | POA: Diagnosis not present

## 2018-01-17 DIAGNOSIS — E1122 Type 2 diabetes mellitus with diabetic chronic kidney disease: Secondary | ICD-10-CM | POA: Diagnosis not present

## 2018-01-17 DIAGNOSIS — D631 Anemia in chronic kidney disease: Secondary | ICD-10-CM | POA: Diagnosis not present

## 2018-01-17 DIAGNOSIS — K920 Hematemesis: Secondary | ICD-10-CM | POA: Diagnosis not present

## 2018-01-17 DIAGNOSIS — K922 Gastrointestinal hemorrhage, unspecified: Secondary | ICD-10-CM | POA: Diagnosis not present

## 2018-01-17 DIAGNOSIS — I081 Rheumatic disorders of both mitral and tricuspid valves: Secondary | ICD-10-CM | POA: Diagnosis not present

## 2018-01-20 DIAGNOSIS — N2581 Secondary hyperparathyroidism of renal origin: Secondary | ICD-10-CM | POA: Diagnosis not present

## 2018-01-20 DIAGNOSIS — I132 Hypertensive heart and chronic kidney disease with heart failure and with stage 5 chronic kidney disease, or end stage renal disease: Secondary | ICD-10-CM | POA: Diagnosis not present

## 2018-01-20 DIAGNOSIS — N186 End stage renal disease: Secondary | ICD-10-CM | POA: Diagnosis not present

## 2018-01-20 DIAGNOSIS — I272 Pulmonary hypertension, unspecified: Secondary | ICD-10-CM | POA: Diagnosis not present

## 2018-01-20 DIAGNOSIS — D631 Anemia in chronic kidney disease: Secondary | ICD-10-CM | POA: Diagnosis not present

## 2018-01-20 DIAGNOSIS — I5022 Chronic systolic (congestive) heart failure: Secondary | ICD-10-CM | POA: Diagnosis not present

## 2018-01-20 DIAGNOSIS — E1122 Type 2 diabetes mellitus with diabetic chronic kidney disease: Secondary | ICD-10-CM | POA: Diagnosis not present

## 2018-01-21 DIAGNOSIS — N186 End stage renal disease: Secondary | ICD-10-CM | POA: Diagnosis not present

## 2018-01-21 DIAGNOSIS — I10 Essential (primary) hypertension: Secondary | ICD-10-CM | POA: Diagnosis not present

## 2018-01-21 DIAGNOSIS — I2583 Coronary atherosclerosis due to lipid rich plaque: Secondary | ICD-10-CM | POA: Diagnosis not present

## 2018-01-21 DIAGNOSIS — I509 Heart failure, unspecified: Secondary | ICD-10-CM | POA: Diagnosis not present

## 2018-01-21 DIAGNOSIS — N401 Enlarged prostate with lower urinary tract symptoms: Secondary | ICD-10-CM | POA: Diagnosis not present

## 2018-01-22 DIAGNOSIS — I5022 Chronic systolic (congestive) heart failure: Secondary | ICD-10-CM | POA: Diagnosis not present

## 2018-01-22 DIAGNOSIS — N2581 Secondary hyperparathyroidism of renal origin: Secondary | ICD-10-CM | POA: Diagnosis not present

## 2018-01-22 DIAGNOSIS — I272 Pulmonary hypertension, unspecified: Secondary | ICD-10-CM | POA: Diagnosis not present

## 2018-01-22 DIAGNOSIS — D631 Anemia in chronic kidney disease: Secondary | ICD-10-CM | POA: Diagnosis not present

## 2018-01-22 DIAGNOSIS — N186 End stage renal disease: Secondary | ICD-10-CM | POA: Diagnosis not present

## 2018-01-22 DIAGNOSIS — E1122 Type 2 diabetes mellitus with diabetic chronic kidney disease: Secondary | ICD-10-CM | POA: Diagnosis not present

## 2018-01-22 DIAGNOSIS — I132 Hypertensive heart and chronic kidney disease with heart failure and with stage 5 chronic kidney disease, or end stage renal disease: Secondary | ICD-10-CM | POA: Diagnosis not present

## 2018-01-23 DIAGNOSIS — I5022 Chronic systolic (congestive) heart failure: Secondary | ICD-10-CM | POA: Diagnosis not present

## 2018-01-23 DIAGNOSIS — I132 Hypertensive heart and chronic kidney disease with heart failure and with stage 5 chronic kidney disease, or end stage renal disease: Secondary | ICD-10-CM | POA: Diagnosis not present

## 2018-01-23 DIAGNOSIS — N186 End stage renal disease: Secondary | ICD-10-CM | POA: Diagnosis not present

## 2018-01-23 DIAGNOSIS — I272 Pulmonary hypertension, unspecified: Secondary | ICD-10-CM | POA: Diagnosis not present

## 2018-01-23 DIAGNOSIS — D631 Anemia in chronic kidney disease: Secondary | ICD-10-CM | POA: Diagnosis not present

## 2018-01-23 DIAGNOSIS — E1122 Type 2 diabetes mellitus with diabetic chronic kidney disease: Secondary | ICD-10-CM | POA: Diagnosis not present

## 2018-01-24 DIAGNOSIS — N2581 Secondary hyperparathyroidism of renal origin: Secondary | ICD-10-CM | POA: Diagnosis not present

## 2018-01-24 DIAGNOSIS — N186 End stage renal disease: Secondary | ICD-10-CM | POA: Diagnosis not present

## 2018-01-24 DIAGNOSIS — D631 Anemia in chronic kidney disease: Secondary | ICD-10-CM | POA: Diagnosis not present

## 2018-01-27 DIAGNOSIS — N2581 Secondary hyperparathyroidism of renal origin: Secondary | ICD-10-CM | POA: Diagnosis not present

## 2018-01-27 DIAGNOSIS — N186 End stage renal disease: Secondary | ICD-10-CM | POA: Diagnosis not present

## 2018-01-27 DIAGNOSIS — D631 Anemia in chronic kidney disease: Secondary | ICD-10-CM | POA: Diagnosis not present

## 2018-01-28 DIAGNOSIS — I132 Hypertensive heart and chronic kidney disease with heart failure and with stage 5 chronic kidney disease, or end stage renal disease: Secondary | ICD-10-CM | POA: Diagnosis not present

## 2018-01-28 DIAGNOSIS — I272 Pulmonary hypertension, unspecified: Secondary | ICD-10-CM | POA: Diagnosis not present

## 2018-01-28 DIAGNOSIS — N186 End stage renal disease: Secondary | ICD-10-CM | POA: Diagnosis not present

## 2018-01-28 DIAGNOSIS — D631 Anemia in chronic kidney disease: Secondary | ICD-10-CM | POA: Diagnosis not present

## 2018-01-28 DIAGNOSIS — I5022 Chronic systolic (congestive) heart failure: Secondary | ICD-10-CM | POA: Diagnosis not present

## 2018-01-28 DIAGNOSIS — E1122 Type 2 diabetes mellitus with diabetic chronic kidney disease: Secondary | ICD-10-CM | POA: Diagnosis not present

## 2018-01-29 DIAGNOSIS — N2581 Secondary hyperparathyroidism of renal origin: Secondary | ICD-10-CM | POA: Diagnosis not present

## 2018-01-29 DIAGNOSIS — N186 End stage renal disease: Secondary | ICD-10-CM | POA: Diagnosis not present

## 2018-01-29 DIAGNOSIS — D631 Anemia in chronic kidney disease: Secondary | ICD-10-CM | POA: Diagnosis not present

## 2018-01-30 DIAGNOSIS — R930 Abnormal findings on diagnostic imaging of skull and head, not elsewhere classified: Secondary | ICD-10-CM | POA: Diagnosis not present

## 2018-01-30 DIAGNOSIS — I272 Pulmonary hypertension, unspecified: Secondary | ICD-10-CM | POA: Diagnosis not present

## 2018-01-30 DIAGNOSIS — I132 Hypertensive heart and chronic kidney disease with heart failure and with stage 5 chronic kidney disease, or end stage renal disease: Secondary | ICD-10-CM | POA: Diagnosis not present

## 2018-01-30 DIAGNOSIS — R4701 Aphasia: Secondary | ICD-10-CM | POA: Diagnosis not present

## 2018-01-30 DIAGNOSIS — I340 Nonrheumatic mitral (valve) insufficiency: Secondary | ICD-10-CM | POA: Diagnosis not present

## 2018-01-30 DIAGNOSIS — Z743 Need for continuous supervision: Secondary | ICD-10-CM | POA: Diagnosis not present

## 2018-01-30 DIAGNOSIS — I2510 Atherosclerotic heart disease of native coronary artery without angina pectoris: Secondary | ICD-10-CM | POA: Diagnosis not present

## 2018-01-30 DIAGNOSIS — R4781 Slurred speech: Secondary | ICD-10-CM | POA: Diagnosis not present

## 2018-01-30 DIAGNOSIS — D631 Anemia in chronic kidney disease: Secondary | ICD-10-CM | POA: Diagnosis not present

## 2018-01-30 DIAGNOSIS — N186 End stage renal disease: Secondary | ICD-10-CM | POA: Diagnosis not present

## 2018-01-30 DIAGNOSIS — I5022 Chronic systolic (congestive) heart failure: Secondary | ICD-10-CM | POA: Diagnosis not present

## 2018-01-30 DIAGNOSIS — Z992 Dependence on renal dialysis: Secondary | ICD-10-CM | POA: Diagnosis not present

## 2018-01-30 DIAGNOSIS — R42 Dizziness and giddiness: Secondary | ICD-10-CM | POA: Diagnosis not present

## 2018-01-30 DIAGNOSIS — Z951 Presence of aortocoronary bypass graft: Secondary | ICD-10-CM | POA: Diagnosis not present

## 2018-01-30 DIAGNOSIS — E119 Type 2 diabetes mellitus without complications: Secondary | ICD-10-CM | POA: Diagnosis not present

## 2018-01-30 DIAGNOSIS — I10 Essential (primary) hypertension: Secondary | ICD-10-CM | POA: Diagnosis not present

## 2018-01-30 DIAGNOSIS — I509 Heart failure, unspecified: Secondary | ICD-10-CM | POA: Diagnosis not present

## 2018-01-30 DIAGNOSIS — I160 Hypertensive urgency: Secondary | ICD-10-CM | POA: Diagnosis not present

## 2018-01-30 DIAGNOSIS — R0989 Other specified symptoms and signs involving the circulatory and respiratory systems: Secondary | ICD-10-CM | POA: Diagnosis not present

## 2018-01-30 DIAGNOSIS — E1122 Type 2 diabetes mellitus with diabetic chronic kidney disease: Secondary | ICD-10-CM | POA: Diagnosis not present

## 2018-01-30 DIAGNOSIS — Z955 Presence of coronary angioplasty implant and graft: Secondary | ICD-10-CM | POA: Diagnosis not present

## 2018-01-30 DIAGNOSIS — R0789 Other chest pain: Secondary | ICD-10-CM | POA: Diagnosis not present

## 2018-01-31 DIAGNOSIS — E119 Type 2 diabetes mellitus without complications: Secondary | ICD-10-CM | POA: Diagnosis not present

## 2018-01-31 DIAGNOSIS — R42 Dizziness and giddiness: Secondary | ICD-10-CM | POA: Diagnosis not present

## 2018-01-31 DIAGNOSIS — I160 Hypertensive urgency: Secondary | ICD-10-CM | POA: Diagnosis not present

## 2018-01-31 DIAGNOSIS — N186 End stage renal disease: Secondary | ICD-10-CM | POA: Diagnosis not present

## 2018-02-03 DIAGNOSIS — I5022 Chronic systolic (congestive) heart failure: Secondary | ICD-10-CM | POA: Diagnosis not present

## 2018-02-03 DIAGNOSIS — N186 End stage renal disease: Secondary | ICD-10-CM | POA: Diagnosis not present

## 2018-02-03 DIAGNOSIS — I132 Hypertensive heart and chronic kidney disease with heart failure and with stage 5 chronic kidney disease, or end stage renal disease: Secondary | ICD-10-CM | POA: Diagnosis not present

## 2018-02-03 DIAGNOSIS — N2581 Secondary hyperparathyroidism of renal origin: Secondary | ICD-10-CM | POA: Diagnosis not present

## 2018-02-03 DIAGNOSIS — D631 Anemia in chronic kidney disease: Secondary | ICD-10-CM | POA: Diagnosis not present

## 2018-02-03 DIAGNOSIS — I272 Pulmonary hypertension, unspecified: Secondary | ICD-10-CM | POA: Diagnosis not present

## 2018-02-03 DIAGNOSIS — E1122 Type 2 diabetes mellitus with diabetic chronic kidney disease: Secondary | ICD-10-CM | POA: Diagnosis not present

## 2018-02-03 DIAGNOSIS — Z23 Encounter for immunization: Secondary | ICD-10-CM | POA: Diagnosis not present

## 2018-02-04 DIAGNOSIS — I10 Essential (primary) hypertension: Secondary | ICD-10-CM | POA: Diagnosis not present

## 2018-02-04 DIAGNOSIS — I132 Hypertensive heart and chronic kidney disease with heart failure and with stage 5 chronic kidney disease, or end stage renal disease: Secondary | ICD-10-CM | POA: Diagnosis not present

## 2018-02-04 DIAGNOSIS — D631 Anemia in chronic kidney disease: Secondary | ICD-10-CM | POA: Diagnosis not present

## 2018-02-04 DIAGNOSIS — N186 End stage renal disease: Secondary | ICD-10-CM | POA: Diagnosis not present

## 2018-02-04 DIAGNOSIS — I509 Heart failure, unspecified: Secondary | ICD-10-CM | POA: Diagnosis not present

## 2018-02-04 DIAGNOSIS — E1122 Type 2 diabetes mellitus with diabetic chronic kidney disease: Secondary | ICD-10-CM | POA: Diagnosis not present

## 2018-02-04 DIAGNOSIS — I5022 Chronic systolic (congestive) heart failure: Secondary | ICD-10-CM | POA: Diagnosis not present

## 2018-02-04 DIAGNOSIS — I2583 Coronary atherosclerosis due to lipid rich plaque: Secondary | ICD-10-CM | POA: Diagnosis not present

## 2018-02-04 DIAGNOSIS — I272 Pulmonary hypertension, unspecified: Secondary | ICD-10-CM | POA: Diagnosis not present

## 2018-02-04 DIAGNOSIS — E0840 Diabetes mellitus due to underlying condition with diabetic neuropathy, unspecified: Secondary | ICD-10-CM | POA: Diagnosis not present

## 2018-02-05 DIAGNOSIS — Z23 Encounter for immunization: Secondary | ICD-10-CM | POA: Diagnosis not present

## 2018-02-05 DIAGNOSIS — I5022 Chronic systolic (congestive) heart failure: Secondary | ICD-10-CM | POA: Diagnosis not present

## 2018-02-05 DIAGNOSIS — N2581 Secondary hyperparathyroidism of renal origin: Secondary | ICD-10-CM | POA: Diagnosis not present

## 2018-02-05 DIAGNOSIS — E1122 Type 2 diabetes mellitus with diabetic chronic kidney disease: Secondary | ICD-10-CM | POA: Diagnosis not present

## 2018-02-05 DIAGNOSIS — I132 Hypertensive heart and chronic kidney disease with heart failure and with stage 5 chronic kidney disease, or end stage renal disease: Secondary | ICD-10-CM | POA: Diagnosis not present

## 2018-02-05 DIAGNOSIS — N186 End stage renal disease: Secondary | ICD-10-CM | POA: Diagnosis not present

## 2018-02-05 DIAGNOSIS — I272 Pulmonary hypertension, unspecified: Secondary | ICD-10-CM | POA: Diagnosis not present

## 2018-02-05 DIAGNOSIS — D631 Anemia in chronic kidney disease: Secondary | ICD-10-CM | POA: Diagnosis not present

## 2018-02-07 DIAGNOSIS — Z23 Encounter for immunization: Secondary | ICD-10-CM | POA: Diagnosis not present

## 2018-02-07 DIAGNOSIS — N2581 Secondary hyperparathyroidism of renal origin: Secondary | ICD-10-CM | POA: Diagnosis not present

## 2018-02-07 DIAGNOSIS — N186 End stage renal disease: Secondary | ICD-10-CM | POA: Diagnosis not present

## 2018-02-10 DIAGNOSIS — N186 End stage renal disease: Secondary | ICD-10-CM | POA: Diagnosis not present

## 2018-02-10 DIAGNOSIS — Z23 Encounter for immunization: Secondary | ICD-10-CM | POA: Diagnosis not present

## 2018-02-10 DIAGNOSIS — I132 Hypertensive heart and chronic kidney disease with heart failure and with stage 5 chronic kidney disease, or end stage renal disease: Secondary | ICD-10-CM | POA: Diagnosis not present

## 2018-02-10 DIAGNOSIS — N2581 Secondary hyperparathyroidism of renal origin: Secondary | ICD-10-CM | POA: Diagnosis not present

## 2018-02-10 DIAGNOSIS — E1122 Type 2 diabetes mellitus with diabetic chronic kidney disease: Secondary | ICD-10-CM | POA: Diagnosis not present

## 2018-02-10 DIAGNOSIS — I5022 Chronic systolic (congestive) heart failure: Secondary | ICD-10-CM | POA: Diagnosis not present

## 2018-02-10 DIAGNOSIS — D631 Anemia in chronic kidney disease: Secondary | ICD-10-CM | POA: Diagnosis not present

## 2018-02-10 DIAGNOSIS — I272 Pulmonary hypertension, unspecified: Secondary | ICD-10-CM | POA: Diagnosis not present

## 2018-02-12 DIAGNOSIS — N186 End stage renal disease: Secondary | ICD-10-CM | POA: Diagnosis not present

## 2018-02-12 DIAGNOSIS — Z23 Encounter for immunization: Secondary | ICD-10-CM | POA: Diagnosis not present

## 2018-02-12 DIAGNOSIS — N2581 Secondary hyperparathyroidism of renal origin: Secondary | ICD-10-CM | POA: Diagnosis not present

## 2018-02-13 DIAGNOSIS — Z7982 Long term (current) use of aspirin: Secondary | ICD-10-CM | POA: Diagnosis not present

## 2018-02-13 DIAGNOSIS — Z951 Presence of aortocoronary bypass graft: Secondary | ICD-10-CM | POA: Diagnosis not present

## 2018-02-13 DIAGNOSIS — R51 Headache: Secondary | ICD-10-CM | POA: Diagnosis not present

## 2018-02-13 DIAGNOSIS — D473 Essential (hemorrhagic) thrombocythemia: Secondary | ICD-10-CM | POA: Diagnosis not present

## 2018-02-13 DIAGNOSIS — I160 Hypertensive urgency: Secondary | ICD-10-CM | POA: Diagnosis not present

## 2018-02-13 DIAGNOSIS — Z992 Dependence on renal dialysis: Secondary | ICD-10-CM | POA: Diagnosis not present

## 2018-02-13 DIAGNOSIS — Z79899 Other long term (current) drug therapy: Secondary | ICD-10-CM | POA: Diagnosis not present

## 2018-02-13 DIAGNOSIS — I272 Pulmonary hypertension, unspecified: Secondary | ICD-10-CM | POA: Diagnosis not present

## 2018-02-13 DIAGNOSIS — Z743 Need for continuous supervision: Secondary | ICD-10-CM | POA: Diagnosis not present

## 2018-02-13 DIAGNOSIS — R079 Chest pain, unspecified: Secondary | ICD-10-CM | POA: Diagnosis not present

## 2018-02-13 DIAGNOSIS — E1122 Type 2 diabetes mellitus with diabetic chronic kidney disease: Secondary | ICD-10-CM | POA: Diagnosis not present

## 2018-02-13 DIAGNOSIS — Z955 Presence of coronary angioplasty implant and graft: Secondary | ICD-10-CM | POA: Diagnosis not present

## 2018-02-13 DIAGNOSIS — I10 Essential (primary) hypertension: Secondary | ICD-10-CM | POA: Diagnosis not present

## 2018-02-13 DIAGNOSIS — N186 End stage renal disease: Secondary | ICD-10-CM | POA: Diagnosis not present

## 2018-02-13 DIAGNOSIS — K922 Gastrointestinal hemorrhage, unspecified: Secondary | ICD-10-CM | POA: Diagnosis not present

## 2018-02-13 DIAGNOSIS — D631 Anemia in chronic kidney disease: Secondary | ICD-10-CM | POA: Diagnosis not present

## 2018-02-13 DIAGNOSIS — I120 Hypertensive chronic kidney disease with stage 5 chronic kidney disease or end stage renal disease: Secondary | ICD-10-CM | POA: Diagnosis not present

## 2018-02-13 DIAGNOSIS — I2510 Atherosclerotic heart disease of native coronary artery without angina pectoris: Secondary | ICD-10-CM | POA: Diagnosis not present

## 2018-02-13 DIAGNOSIS — J209 Acute bronchitis, unspecified: Secondary | ICD-10-CM | POA: Diagnosis not present

## 2018-02-14 DIAGNOSIS — D473 Essential (hemorrhagic) thrombocythemia: Secondary | ICD-10-CM | POA: Diagnosis not present

## 2018-02-14 DIAGNOSIS — J209 Acute bronchitis, unspecified: Secondary | ICD-10-CM | POA: Diagnosis not present

## 2018-02-14 DIAGNOSIS — I10 Essential (primary) hypertension: Secondary | ICD-10-CM | POA: Diagnosis not present

## 2018-02-14 DIAGNOSIS — N186 End stage renal disease: Secondary | ICD-10-CM | POA: Diagnosis not present

## 2018-02-14 DIAGNOSIS — R0781 Pleurodynia: Secondary | ICD-10-CM | POA: Diagnosis not present

## 2018-02-15 DIAGNOSIS — D631 Anemia in chronic kidney disease: Secondary | ICD-10-CM | POA: Diagnosis not present

## 2018-02-15 DIAGNOSIS — I5022 Chronic systolic (congestive) heart failure: Secondary | ICD-10-CM | POA: Diagnosis not present

## 2018-02-15 DIAGNOSIS — E1122 Type 2 diabetes mellitus with diabetic chronic kidney disease: Secondary | ICD-10-CM | POA: Diagnosis not present

## 2018-02-15 DIAGNOSIS — N186 End stage renal disease: Secondary | ICD-10-CM | POA: Diagnosis not present

## 2018-02-15 DIAGNOSIS — I132 Hypertensive heart and chronic kidney disease with heart failure and with stage 5 chronic kidney disease, or end stage renal disease: Secondary | ICD-10-CM | POA: Diagnosis not present

## 2018-02-15 DIAGNOSIS — I272 Pulmonary hypertension, unspecified: Secondary | ICD-10-CM | POA: Diagnosis not present

## 2018-02-17 DIAGNOSIS — N2581 Secondary hyperparathyroidism of renal origin: Secondary | ICD-10-CM | POA: Diagnosis not present

## 2018-02-17 DIAGNOSIS — Z23 Encounter for immunization: Secondary | ICD-10-CM | POA: Diagnosis not present

## 2018-02-17 DIAGNOSIS — N186 End stage renal disease: Secondary | ICD-10-CM | POA: Diagnosis not present

## 2018-02-18 DIAGNOSIS — E1122 Type 2 diabetes mellitus with diabetic chronic kidney disease: Secondary | ICD-10-CM | POA: Diagnosis not present

## 2018-02-18 DIAGNOSIS — D631 Anemia in chronic kidney disease: Secondary | ICD-10-CM | POA: Diagnosis not present

## 2018-02-18 DIAGNOSIS — N186 End stage renal disease: Secondary | ICD-10-CM | POA: Diagnosis not present

## 2018-02-18 DIAGNOSIS — I272 Pulmonary hypertension, unspecified: Secondary | ICD-10-CM | POA: Diagnosis not present

## 2018-02-18 DIAGNOSIS — I5022 Chronic systolic (congestive) heart failure: Secondary | ICD-10-CM | POA: Diagnosis not present

## 2018-02-18 DIAGNOSIS — I132 Hypertensive heart and chronic kidney disease with heart failure and with stage 5 chronic kidney disease, or end stage renal disease: Secondary | ICD-10-CM | POA: Diagnosis not present

## 2018-02-19 DIAGNOSIS — Z23 Encounter for immunization: Secondary | ICD-10-CM | POA: Diagnosis not present

## 2018-02-19 DIAGNOSIS — N186 End stage renal disease: Secondary | ICD-10-CM | POA: Diagnosis not present

## 2018-02-19 DIAGNOSIS — N2581 Secondary hyperparathyroidism of renal origin: Secondary | ICD-10-CM | POA: Diagnosis not present

## 2018-02-20 DIAGNOSIS — I10 Essential (primary) hypertension: Secondary | ICD-10-CM | POA: Diagnosis not present

## 2018-02-20 DIAGNOSIS — N186 End stage renal disease: Secondary | ICD-10-CM | POA: Diagnosis not present

## 2018-02-20 DIAGNOSIS — E0840 Diabetes mellitus due to underlying condition with diabetic neuropathy, unspecified: Secondary | ICD-10-CM | POA: Diagnosis not present

## 2018-02-20 DIAGNOSIS — I509 Heart failure, unspecified: Secondary | ICD-10-CM | POA: Diagnosis not present

## 2018-02-20 DIAGNOSIS — I2583 Coronary atherosclerosis due to lipid rich plaque: Secondary | ICD-10-CM | POA: Diagnosis not present

## 2018-02-21 DIAGNOSIS — N186 End stage renal disease: Secondary | ICD-10-CM | POA: Diagnosis not present

## 2018-02-21 DIAGNOSIS — D631 Anemia in chronic kidney disease: Secondary | ICD-10-CM | POA: Diagnosis not present

## 2018-02-21 DIAGNOSIS — I5022 Chronic systolic (congestive) heart failure: Secondary | ICD-10-CM | POA: Diagnosis not present

## 2018-02-21 DIAGNOSIS — E1122 Type 2 diabetes mellitus with diabetic chronic kidney disease: Secondary | ICD-10-CM | POA: Diagnosis not present

## 2018-02-21 DIAGNOSIS — I132 Hypertensive heart and chronic kidney disease with heart failure and with stage 5 chronic kidney disease, or end stage renal disease: Secondary | ICD-10-CM | POA: Diagnosis not present

## 2018-02-21 DIAGNOSIS — I272 Pulmonary hypertension, unspecified: Secondary | ICD-10-CM | POA: Diagnosis not present

## 2018-02-24 DIAGNOSIS — N186 End stage renal disease: Secondary | ICD-10-CM | POA: Diagnosis not present

## 2018-02-24 DIAGNOSIS — N2581 Secondary hyperparathyroidism of renal origin: Secondary | ICD-10-CM | POA: Diagnosis not present

## 2018-02-24 DIAGNOSIS — Z23 Encounter for immunization: Secondary | ICD-10-CM | POA: Diagnosis not present

## 2018-02-25 DIAGNOSIS — I132 Hypertensive heart and chronic kidney disease with heart failure and with stage 5 chronic kidney disease, or end stage renal disease: Secondary | ICD-10-CM | POA: Diagnosis not present

## 2018-02-25 DIAGNOSIS — I5022 Chronic systolic (congestive) heart failure: Secondary | ICD-10-CM | POA: Diagnosis not present

## 2018-02-25 DIAGNOSIS — E1122 Type 2 diabetes mellitus with diabetic chronic kidney disease: Secondary | ICD-10-CM | POA: Diagnosis not present

## 2018-02-25 DIAGNOSIS — N186 End stage renal disease: Secondary | ICD-10-CM | POA: Diagnosis not present

## 2018-02-25 DIAGNOSIS — D631 Anemia in chronic kidney disease: Secondary | ICD-10-CM | POA: Diagnosis not present

## 2018-02-25 DIAGNOSIS — I272 Pulmonary hypertension, unspecified: Secondary | ICD-10-CM | POA: Diagnosis not present

## 2018-02-26 DIAGNOSIS — I5022 Chronic systolic (congestive) heart failure: Secondary | ICD-10-CM | POA: Diagnosis not present

## 2018-02-26 DIAGNOSIS — I272 Pulmonary hypertension, unspecified: Secondary | ICD-10-CM | POA: Diagnosis not present

## 2018-02-26 DIAGNOSIS — Z23 Encounter for immunization: Secondary | ICD-10-CM | POA: Diagnosis not present

## 2018-02-26 DIAGNOSIS — E1122 Type 2 diabetes mellitus with diabetic chronic kidney disease: Secondary | ICD-10-CM | POA: Diagnosis not present

## 2018-02-26 DIAGNOSIS — I132 Hypertensive heart and chronic kidney disease with heart failure and with stage 5 chronic kidney disease, or end stage renal disease: Secondary | ICD-10-CM | POA: Diagnosis not present

## 2018-02-26 DIAGNOSIS — N186 End stage renal disease: Secondary | ICD-10-CM | POA: Diagnosis not present

## 2018-02-26 DIAGNOSIS — N2581 Secondary hyperparathyroidism of renal origin: Secondary | ICD-10-CM | POA: Diagnosis not present

## 2018-02-26 DIAGNOSIS — D631 Anemia in chronic kidney disease: Secondary | ICD-10-CM | POA: Diagnosis not present

## 2018-02-27 DIAGNOSIS — I5022 Chronic systolic (congestive) heart failure: Secondary | ICD-10-CM | POA: Diagnosis not present

## 2018-02-27 DIAGNOSIS — E1122 Type 2 diabetes mellitus with diabetic chronic kidney disease: Secondary | ICD-10-CM | POA: Diagnosis not present

## 2018-02-27 DIAGNOSIS — N186 End stage renal disease: Secondary | ICD-10-CM | POA: Diagnosis not present

## 2018-02-27 DIAGNOSIS — I272 Pulmonary hypertension, unspecified: Secondary | ICD-10-CM | POA: Diagnosis not present

## 2018-02-27 DIAGNOSIS — I132 Hypertensive heart and chronic kidney disease with heart failure and with stage 5 chronic kidney disease, or end stage renal disease: Secondary | ICD-10-CM | POA: Diagnosis not present

## 2018-02-27 DIAGNOSIS — D631 Anemia in chronic kidney disease: Secondary | ICD-10-CM | POA: Diagnosis not present

## 2018-02-28 DIAGNOSIS — Z23 Encounter for immunization: Secondary | ICD-10-CM | POA: Diagnosis not present

## 2018-02-28 DIAGNOSIS — N2581 Secondary hyperparathyroidism of renal origin: Secondary | ICD-10-CM | POA: Diagnosis not present

## 2018-02-28 DIAGNOSIS — N186 End stage renal disease: Secondary | ICD-10-CM | POA: Diagnosis not present

## 2018-03-02 DIAGNOSIS — N186 End stage renal disease: Secondary | ICD-10-CM | POA: Diagnosis not present

## 2018-03-03 DIAGNOSIS — I132 Hypertensive heart and chronic kidney disease with heart failure and with stage 5 chronic kidney disease, or end stage renal disease: Secondary | ICD-10-CM | POA: Diagnosis not present

## 2018-03-03 DIAGNOSIS — N186 End stage renal disease: Secondary | ICD-10-CM | POA: Diagnosis not present

## 2018-03-03 DIAGNOSIS — D631 Anemia in chronic kidney disease: Secondary | ICD-10-CM | POA: Diagnosis not present

## 2018-03-03 DIAGNOSIS — I5022 Chronic systolic (congestive) heart failure: Secondary | ICD-10-CM | POA: Diagnosis not present

## 2018-03-03 DIAGNOSIS — I272 Pulmonary hypertension, unspecified: Secondary | ICD-10-CM | POA: Diagnosis not present

## 2018-03-03 DIAGNOSIS — N2581 Secondary hyperparathyroidism of renal origin: Secondary | ICD-10-CM | POA: Diagnosis not present

## 2018-03-03 DIAGNOSIS — E1122 Type 2 diabetes mellitus with diabetic chronic kidney disease: Secondary | ICD-10-CM | POA: Diagnosis not present

## 2018-03-05 DIAGNOSIS — I272 Pulmonary hypertension, unspecified: Secondary | ICD-10-CM | POA: Diagnosis not present

## 2018-03-05 DIAGNOSIS — I5022 Chronic systolic (congestive) heart failure: Secondary | ICD-10-CM | POA: Diagnosis not present

## 2018-03-05 DIAGNOSIS — I132 Hypertensive heart and chronic kidney disease with heart failure and with stage 5 chronic kidney disease, or end stage renal disease: Secondary | ICD-10-CM | POA: Diagnosis not present

## 2018-03-05 DIAGNOSIS — D631 Anemia in chronic kidney disease: Secondary | ICD-10-CM | POA: Diagnosis not present

## 2018-03-05 DIAGNOSIS — N186 End stage renal disease: Secondary | ICD-10-CM | POA: Diagnosis not present

## 2018-03-05 DIAGNOSIS — E1122 Type 2 diabetes mellitus with diabetic chronic kidney disease: Secondary | ICD-10-CM | POA: Diagnosis not present

## 2018-03-07 DIAGNOSIS — D631 Anemia in chronic kidney disease: Secondary | ICD-10-CM | POA: Diagnosis not present

## 2018-03-07 DIAGNOSIS — N186 End stage renal disease: Secondary | ICD-10-CM | POA: Diagnosis not present

## 2018-03-07 DIAGNOSIS — N2581 Secondary hyperparathyroidism of renal origin: Secondary | ICD-10-CM | POA: Diagnosis not present

## 2018-03-08 DIAGNOSIS — D631 Anemia in chronic kidney disease: Secondary | ICD-10-CM | POA: Diagnosis not present

## 2018-03-08 DIAGNOSIS — N2581 Secondary hyperparathyroidism of renal origin: Secondary | ICD-10-CM | POA: Diagnosis not present

## 2018-03-08 DIAGNOSIS — I5022 Chronic systolic (congestive) heart failure: Secondary | ICD-10-CM | POA: Diagnosis not present

## 2018-03-08 DIAGNOSIS — N186 End stage renal disease: Secondary | ICD-10-CM | POA: Diagnosis not present

## 2018-03-08 DIAGNOSIS — E1122 Type 2 diabetes mellitus with diabetic chronic kidney disease: Secondary | ICD-10-CM | POA: Diagnosis not present

## 2018-03-08 DIAGNOSIS — I132 Hypertensive heart and chronic kidney disease with heart failure and with stage 5 chronic kidney disease, or end stage renal disease: Secondary | ICD-10-CM | POA: Diagnosis not present

## 2018-03-08 DIAGNOSIS — I272 Pulmonary hypertension, unspecified: Secondary | ICD-10-CM | POA: Diagnosis not present

## 2018-03-10 DIAGNOSIS — N2581 Secondary hyperparathyroidism of renal origin: Secondary | ICD-10-CM | POA: Diagnosis not present

## 2018-03-10 DIAGNOSIS — I272 Pulmonary hypertension, unspecified: Secondary | ICD-10-CM | POA: Diagnosis not present

## 2018-03-10 DIAGNOSIS — I5022 Chronic systolic (congestive) heart failure: Secondary | ICD-10-CM | POA: Diagnosis not present

## 2018-03-10 DIAGNOSIS — D631 Anemia in chronic kidney disease: Secondary | ICD-10-CM | POA: Diagnosis not present

## 2018-03-10 DIAGNOSIS — E1122 Type 2 diabetes mellitus with diabetic chronic kidney disease: Secondary | ICD-10-CM | POA: Diagnosis not present

## 2018-03-10 DIAGNOSIS — I132 Hypertensive heart and chronic kidney disease with heart failure and with stage 5 chronic kidney disease, or end stage renal disease: Secondary | ICD-10-CM | POA: Diagnosis not present

## 2018-03-10 DIAGNOSIS — N186 End stage renal disease: Secondary | ICD-10-CM | POA: Diagnosis not present

## 2018-03-12 DIAGNOSIS — I132 Hypertensive heart and chronic kidney disease with heart failure and with stage 5 chronic kidney disease, or end stage renal disease: Secondary | ICD-10-CM | POA: Diagnosis not present

## 2018-03-12 DIAGNOSIS — I272 Pulmonary hypertension, unspecified: Secondary | ICD-10-CM | POA: Diagnosis not present

## 2018-03-12 DIAGNOSIS — D631 Anemia in chronic kidney disease: Secondary | ICD-10-CM | POA: Diagnosis not present

## 2018-03-12 DIAGNOSIS — N2581 Secondary hyperparathyroidism of renal origin: Secondary | ICD-10-CM | POA: Diagnosis not present

## 2018-03-12 DIAGNOSIS — E1122 Type 2 diabetes mellitus with diabetic chronic kidney disease: Secondary | ICD-10-CM | POA: Diagnosis not present

## 2018-03-12 DIAGNOSIS — N186 End stage renal disease: Secondary | ICD-10-CM | POA: Diagnosis not present

## 2018-03-12 DIAGNOSIS — I5022 Chronic systolic (congestive) heart failure: Secondary | ICD-10-CM | POA: Diagnosis not present

## 2018-03-13 DIAGNOSIS — E8779 Other fluid overload: Secondary | ICD-10-CM | POA: Diagnosis not present

## 2018-03-13 DIAGNOSIS — N186 End stage renal disease: Secondary | ICD-10-CM | POA: Diagnosis not present

## 2018-03-13 DIAGNOSIS — N2581 Secondary hyperparathyroidism of renal origin: Secondary | ICD-10-CM | POA: Diagnosis not present

## 2018-03-14 DIAGNOSIS — I132 Hypertensive heart and chronic kidney disease with heart failure and with stage 5 chronic kidney disease, or end stage renal disease: Secondary | ICD-10-CM | POA: Diagnosis not present

## 2018-03-14 DIAGNOSIS — I272 Pulmonary hypertension, unspecified: Secondary | ICD-10-CM | POA: Diagnosis not present

## 2018-03-14 DIAGNOSIS — I5022 Chronic systolic (congestive) heart failure: Secondary | ICD-10-CM | POA: Diagnosis not present

## 2018-03-14 DIAGNOSIS — E1122 Type 2 diabetes mellitus with diabetic chronic kidney disease: Secondary | ICD-10-CM | POA: Diagnosis not present

## 2018-03-14 DIAGNOSIS — D631 Anemia in chronic kidney disease: Secondary | ICD-10-CM | POA: Diagnosis not present

## 2018-03-14 DIAGNOSIS — N2581 Secondary hyperparathyroidism of renal origin: Secondary | ICD-10-CM | POA: Diagnosis not present

## 2018-03-14 DIAGNOSIS — N186 End stage renal disease: Secondary | ICD-10-CM | POA: Diagnosis not present

## 2018-03-17 DIAGNOSIS — N186 End stage renal disease: Secondary | ICD-10-CM | POA: Diagnosis not present

## 2018-03-17 DIAGNOSIS — N2581 Secondary hyperparathyroidism of renal origin: Secondary | ICD-10-CM | POA: Diagnosis not present

## 2018-03-17 DIAGNOSIS — D631 Anemia in chronic kidney disease: Secondary | ICD-10-CM | POA: Diagnosis not present

## 2018-03-18 DIAGNOSIS — E1122 Type 2 diabetes mellitus with diabetic chronic kidney disease: Secondary | ICD-10-CM | POA: Diagnosis not present

## 2018-03-18 DIAGNOSIS — I252 Old myocardial infarction: Secondary | ICD-10-CM | POA: Diagnosis not present

## 2018-03-18 DIAGNOSIS — I081 Rheumatic disorders of both mitral and tricuspid valves: Secondary | ICD-10-CM | POA: Diagnosis not present

## 2018-03-18 DIAGNOSIS — Z9181 History of falling: Secondary | ICD-10-CM | POA: Diagnosis not present

## 2018-03-18 DIAGNOSIS — I2510 Atherosclerotic heart disease of native coronary artery without angina pectoris: Secondary | ICD-10-CM | POA: Diagnosis not present

## 2018-03-18 DIAGNOSIS — I132 Hypertensive heart and chronic kidney disease with heart failure and with stage 5 chronic kidney disease, or end stage renal disease: Secondary | ICD-10-CM | POA: Diagnosis not present

## 2018-03-18 DIAGNOSIS — I272 Pulmonary hypertension, unspecified: Secondary | ICD-10-CM | POA: Diagnosis not present

## 2018-03-18 DIAGNOSIS — Z86718 Personal history of other venous thrombosis and embolism: Secondary | ICD-10-CM | POA: Diagnosis not present

## 2018-03-18 DIAGNOSIS — Z955 Presence of coronary angioplasty implant and graft: Secondary | ICD-10-CM | POA: Diagnosis not present

## 2018-03-18 DIAGNOSIS — D631 Anemia in chronic kidney disease: Secondary | ICD-10-CM | POA: Diagnosis not present

## 2018-03-18 DIAGNOSIS — I255 Ischemic cardiomyopathy: Secondary | ICD-10-CM | POA: Diagnosis not present

## 2018-03-18 DIAGNOSIS — I5022 Chronic systolic (congestive) heart failure: Secondary | ICD-10-CM | POA: Diagnosis not present

## 2018-03-18 DIAGNOSIS — Z951 Presence of aortocoronary bypass graft: Secondary | ICD-10-CM | POA: Diagnosis not present

## 2018-03-18 DIAGNOSIS — N186 End stage renal disease: Secondary | ICD-10-CM | POA: Diagnosis not present

## 2018-03-18 DIAGNOSIS — E11319 Type 2 diabetes mellitus with unspecified diabetic retinopathy without macular edema: Secondary | ICD-10-CM | POA: Diagnosis not present

## 2018-03-18 DIAGNOSIS — I340 Nonrheumatic mitral (valve) insufficiency: Secondary | ICD-10-CM | POA: Diagnosis not present

## 2018-03-18 DIAGNOSIS — E1140 Type 2 diabetes mellitus with diabetic neuropathy, unspecified: Secondary | ICD-10-CM | POA: Diagnosis not present

## 2018-03-18 DIAGNOSIS — Z7902 Long term (current) use of antithrombotics/antiplatelets: Secondary | ICD-10-CM | POA: Diagnosis not present

## 2018-03-18 DIAGNOSIS — E785 Hyperlipidemia, unspecified: Secondary | ICD-10-CM | POA: Diagnosis not present

## 2018-03-18 DIAGNOSIS — Z7982 Long term (current) use of aspirin: Secondary | ICD-10-CM | POA: Diagnosis not present

## 2018-03-18 DIAGNOSIS — Z992 Dependence on renal dialysis: Secondary | ICD-10-CM | POA: Diagnosis not present

## 2018-03-19 DIAGNOSIS — N186 End stage renal disease: Secondary | ICD-10-CM | POA: Diagnosis not present

## 2018-03-19 DIAGNOSIS — N2581 Secondary hyperparathyroidism of renal origin: Secondary | ICD-10-CM | POA: Diagnosis not present

## 2018-03-19 DIAGNOSIS — D631 Anemia in chronic kidney disease: Secondary | ICD-10-CM | POA: Diagnosis not present

## 2018-03-20 DIAGNOSIS — D631 Anemia in chronic kidney disease: Secondary | ICD-10-CM | POA: Diagnosis not present

## 2018-03-20 DIAGNOSIS — I272 Pulmonary hypertension, unspecified: Secondary | ICD-10-CM | POA: Diagnosis not present

## 2018-03-20 DIAGNOSIS — E0840 Diabetes mellitus due to underlying condition with diabetic neuropathy, unspecified: Secondary | ICD-10-CM | POA: Diagnosis not present

## 2018-03-20 DIAGNOSIS — I2583 Coronary atherosclerosis due to lipid rich plaque: Secondary | ICD-10-CM | POA: Diagnosis not present

## 2018-03-20 DIAGNOSIS — N186 End stage renal disease: Secondary | ICD-10-CM | POA: Diagnosis not present

## 2018-03-20 DIAGNOSIS — E1122 Type 2 diabetes mellitus with diabetic chronic kidney disease: Secondary | ICD-10-CM | POA: Diagnosis not present

## 2018-03-20 DIAGNOSIS — I5022 Chronic systolic (congestive) heart failure: Secondary | ICD-10-CM | POA: Diagnosis not present

## 2018-03-20 DIAGNOSIS — I509 Heart failure, unspecified: Secondary | ICD-10-CM | POA: Diagnosis not present

## 2018-03-20 DIAGNOSIS — I10 Essential (primary) hypertension: Secondary | ICD-10-CM | POA: Diagnosis not present

## 2018-03-20 DIAGNOSIS — I132 Hypertensive heart and chronic kidney disease with heart failure and with stage 5 chronic kidney disease, or end stage renal disease: Secondary | ICD-10-CM | POA: Diagnosis not present

## 2018-03-21 DIAGNOSIS — N186 End stage renal disease: Secondary | ICD-10-CM | POA: Diagnosis not present

## 2018-03-21 DIAGNOSIS — D631 Anemia in chronic kidney disease: Secondary | ICD-10-CM | POA: Diagnosis not present

## 2018-03-21 DIAGNOSIS — N2581 Secondary hyperparathyroidism of renal origin: Secondary | ICD-10-CM | POA: Diagnosis not present

## 2018-03-22 DIAGNOSIS — N2581 Secondary hyperparathyroidism of renal origin: Secondary | ICD-10-CM | POA: Diagnosis not present

## 2018-03-22 DIAGNOSIS — N186 End stage renal disease: Secondary | ICD-10-CM | POA: Diagnosis not present

## 2018-03-22 DIAGNOSIS — E8779 Other fluid overload: Secondary | ICD-10-CM | POA: Diagnosis not present

## 2018-03-24 DIAGNOSIS — D631 Anemia in chronic kidney disease: Secondary | ICD-10-CM | POA: Diagnosis not present

## 2018-03-24 DIAGNOSIS — N2581 Secondary hyperparathyroidism of renal origin: Secondary | ICD-10-CM | POA: Diagnosis not present

## 2018-03-24 DIAGNOSIS — N186 End stage renal disease: Secondary | ICD-10-CM | POA: Diagnosis not present

## 2018-03-26 DIAGNOSIS — N186 End stage renal disease: Secondary | ICD-10-CM | POA: Diagnosis not present

## 2018-03-26 DIAGNOSIS — D631 Anemia in chronic kidney disease: Secondary | ICD-10-CM | POA: Diagnosis not present

## 2018-03-26 DIAGNOSIS — N2581 Secondary hyperparathyroidism of renal origin: Secondary | ICD-10-CM | POA: Diagnosis not present

## 2018-03-27 DIAGNOSIS — I132 Hypertensive heart and chronic kidney disease with heart failure and with stage 5 chronic kidney disease, or end stage renal disease: Secondary | ICD-10-CM | POA: Diagnosis not present

## 2018-03-27 DIAGNOSIS — N186 End stage renal disease: Secondary | ICD-10-CM | POA: Diagnosis not present

## 2018-03-27 DIAGNOSIS — E1122 Type 2 diabetes mellitus with diabetic chronic kidney disease: Secondary | ICD-10-CM | POA: Diagnosis not present

## 2018-03-27 DIAGNOSIS — I5022 Chronic systolic (congestive) heart failure: Secondary | ICD-10-CM | POA: Diagnosis not present

## 2018-03-27 DIAGNOSIS — I2510 Atherosclerotic heart disease of native coronary artery without angina pectoris: Secondary | ICD-10-CM | POA: Diagnosis not present

## 2018-03-27 DIAGNOSIS — I272 Pulmonary hypertension, unspecified: Secondary | ICD-10-CM | POA: Diagnosis not present

## 2018-03-27 DIAGNOSIS — D631 Anemia in chronic kidney disease: Secondary | ICD-10-CM | POA: Diagnosis not present

## 2018-03-27 DIAGNOSIS — I340 Nonrheumatic mitral (valve) insufficiency: Secondary | ICD-10-CM | POA: Diagnosis not present

## 2018-03-28 DIAGNOSIS — D631 Anemia in chronic kidney disease: Secondary | ICD-10-CM | POA: Diagnosis not present

## 2018-03-28 DIAGNOSIS — N186 End stage renal disease: Secondary | ICD-10-CM | POA: Diagnosis not present

## 2018-03-28 DIAGNOSIS — N2581 Secondary hyperparathyroidism of renal origin: Secondary | ICD-10-CM | POA: Diagnosis not present

## 2018-03-31 DIAGNOSIS — N2581 Secondary hyperparathyroidism of renal origin: Secondary | ICD-10-CM | POA: Diagnosis not present

## 2018-03-31 DIAGNOSIS — D631 Anemia in chronic kidney disease: Secondary | ICD-10-CM | POA: Diagnosis not present

## 2018-03-31 DIAGNOSIS — N186 End stage renal disease: Secondary | ICD-10-CM | POA: Diagnosis not present

## 2018-04-01 DIAGNOSIS — I340 Nonrheumatic mitral (valve) insufficiency: Secondary | ICD-10-CM | POA: Diagnosis not present

## 2018-04-01 DIAGNOSIS — I2510 Atherosclerotic heart disease of native coronary artery without angina pectoris: Secondary | ICD-10-CM | POA: Diagnosis not present

## 2018-04-01 DIAGNOSIS — I255 Ischemic cardiomyopathy: Secondary | ICD-10-CM | POA: Diagnosis not present

## 2018-04-01 DIAGNOSIS — I5022 Chronic systolic (congestive) heart failure: Secondary | ICD-10-CM | POA: Diagnosis not present

## 2018-04-02 DIAGNOSIS — N2581 Secondary hyperparathyroidism of renal origin: Secondary | ICD-10-CM | POA: Diagnosis not present

## 2018-04-02 DIAGNOSIS — D631 Anemia in chronic kidney disease: Secondary | ICD-10-CM | POA: Diagnosis not present

## 2018-04-02 DIAGNOSIS — N186 End stage renal disease: Secondary | ICD-10-CM | POA: Diagnosis not present

## 2018-04-03 DIAGNOSIS — D631 Anemia in chronic kidney disease: Secondary | ICD-10-CM | POA: Diagnosis not present

## 2018-04-03 DIAGNOSIS — I5022 Chronic systolic (congestive) heart failure: Secondary | ICD-10-CM | POA: Diagnosis not present

## 2018-04-03 DIAGNOSIS — I132 Hypertensive heart and chronic kidney disease with heart failure and with stage 5 chronic kidney disease, or end stage renal disease: Secondary | ICD-10-CM | POA: Diagnosis not present

## 2018-04-03 DIAGNOSIS — I272 Pulmonary hypertension, unspecified: Secondary | ICD-10-CM | POA: Diagnosis not present

## 2018-04-03 DIAGNOSIS — N186 End stage renal disease: Secondary | ICD-10-CM | POA: Diagnosis not present

## 2018-04-03 DIAGNOSIS — E1122 Type 2 diabetes mellitus with diabetic chronic kidney disease: Secondary | ICD-10-CM | POA: Diagnosis not present

## 2018-04-04 DIAGNOSIS — N2581 Secondary hyperparathyroidism of renal origin: Secondary | ICD-10-CM | POA: Diagnosis not present

## 2018-04-04 DIAGNOSIS — N186 End stage renal disease: Secondary | ICD-10-CM | POA: Diagnosis not present

## 2018-04-07 DIAGNOSIS — N186 End stage renal disease: Secondary | ICD-10-CM | POA: Diagnosis not present

## 2018-04-07 DIAGNOSIS — N2581 Secondary hyperparathyroidism of renal origin: Secondary | ICD-10-CM | POA: Diagnosis not present

## 2018-04-08 DIAGNOSIS — Z992 Dependence on renal dialysis: Secondary | ICD-10-CM | POA: Diagnosis not present

## 2018-04-08 DIAGNOSIS — Z9861 Coronary angioplasty status: Secondary | ICD-10-CM | POA: Diagnosis not present

## 2018-04-08 DIAGNOSIS — R0789 Other chest pain: Secondary | ICD-10-CM | POA: Diagnosis not present

## 2018-04-08 DIAGNOSIS — R079 Chest pain, unspecified: Secondary | ICD-10-CM | POA: Diagnosis not present

## 2018-04-08 DIAGNOSIS — E1122 Type 2 diabetes mellitus with diabetic chronic kidney disease: Secondary | ICD-10-CM | POA: Diagnosis not present

## 2018-04-08 DIAGNOSIS — I340 Nonrheumatic mitral (valve) insufficiency: Secondary | ICD-10-CM | POA: Diagnosis not present

## 2018-04-08 DIAGNOSIS — I132 Hypertensive heart and chronic kidney disease with heart failure and with stage 5 chronic kidney disease, or end stage renal disease: Secondary | ICD-10-CM | POA: Diagnosis not present

## 2018-04-08 DIAGNOSIS — Z951 Presence of aortocoronary bypass graft: Secondary | ICD-10-CM | POA: Diagnosis not present

## 2018-04-08 DIAGNOSIS — I2510 Atherosclerotic heart disease of native coronary artery without angina pectoris: Secondary | ICD-10-CM | POA: Diagnosis not present

## 2018-04-08 DIAGNOSIS — N186 End stage renal disease: Secondary | ICD-10-CM | POA: Diagnosis not present

## 2018-04-08 DIAGNOSIS — Z7902 Long term (current) use of antithrombotics/antiplatelets: Secondary | ICD-10-CM | POA: Diagnosis not present

## 2018-04-08 DIAGNOSIS — J90 Pleural effusion, not elsewhere classified: Secondary | ICD-10-CM | POA: Diagnosis not present

## 2018-04-08 DIAGNOSIS — R918 Other nonspecific abnormal finding of lung field: Secondary | ICD-10-CM | POA: Diagnosis not present

## 2018-04-08 DIAGNOSIS — Z89429 Acquired absence of other toe(s), unspecified side: Secondary | ICD-10-CM | POA: Diagnosis not present

## 2018-04-08 DIAGNOSIS — J9811 Atelectasis: Secondary | ICD-10-CM | POA: Diagnosis not present

## 2018-04-08 DIAGNOSIS — Z743 Need for continuous supervision: Secondary | ICD-10-CM | POA: Diagnosis not present

## 2018-04-08 DIAGNOSIS — I509 Heart failure, unspecified: Secondary | ICD-10-CM | POA: Diagnosis not present

## 2018-04-08 DIAGNOSIS — Z7982 Long term (current) use of aspirin: Secondary | ICD-10-CM | POA: Diagnosis not present

## 2018-04-09 DIAGNOSIS — J90 Pleural effusion, not elsewhere classified: Secondary | ICD-10-CM | POA: Diagnosis not present

## 2018-04-09 DIAGNOSIS — I255 Ischemic cardiomyopathy: Secondary | ICD-10-CM | POA: Diagnosis not present

## 2018-04-09 DIAGNOSIS — N186 End stage renal disease: Secondary | ICD-10-CM | POA: Diagnosis not present

## 2018-04-09 DIAGNOSIS — J9811 Atelectasis: Secondary | ICD-10-CM | POA: Diagnosis not present

## 2018-04-09 DIAGNOSIS — I517 Cardiomegaly: Secondary | ICD-10-CM | POA: Diagnosis not present

## 2018-04-09 DIAGNOSIS — I340 Nonrheumatic mitral (valve) insufficiency: Secondary | ICD-10-CM | POA: Diagnosis not present

## 2018-04-09 DIAGNOSIS — Z992 Dependence on renal dialysis: Secondary | ICD-10-CM | POA: Diagnosis not present

## 2018-04-09 DIAGNOSIS — I2510 Atherosclerotic heart disease of native coronary artery without angina pectoris: Secondary | ICD-10-CM | POA: Diagnosis not present

## 2018-04-09 DIAGNOSIS — R079 Chest pain, unspecified: Secondary | ICD-10-CM | POA: Diagnosis not present

## 2018-04-09 DIAGNOSIS — I5022 Chronic systolic (congestive) heart failure: Secondary | ICD-10-CM | POA: Diagnosis not present

## 2018-04-09 DIAGNOSIS — I120 Hypertensive chronic kidney disease with stage 5 chronic kidney disease or end stage renal disease: Secondary | ICD-10-CM | POA: Diagnosis not present

## 2018-04-09 DIAGNOSIS — R072 Precordial pain: Secondary | ICD-10-CM | POA: Diagnosis not present

## 2018-04-10 DIAGNOSIS — D649 Anemia, unspecified: Secondary | ICD-10-CM | POA: Diagnosis not present

## 2018-04-10 DIAGNOSIS — R0609 Other forms of dyspnea: Secondary | ICD-10-CM | POA: Diagnosis not present

## 2018-04-10 DIAGNOSIS — N186 End stage renal disease: Secondary | ICD-10-CM | POA: Diagnosis not present

## 2018-04-10 DIAGNOSIS — I10 Essential (primary) hypertension: Secondary | ICD-10-CM | POA: Diagnosis not present

## 2018-04-10 DIAGNOSIS — I120 Hypertensive chronic kidney disease with stage 5 chronic kidney disease or end stage renal disease: Secondary | ICD-10-CM | POA: Diagnosis not present

## 2018-04-10 DIAGNOSIS — R072 Precordial pain: Secondary | ICD-10-CM | POA: Diagnosis not present

## 2018-04-11 DIAGNOSIS — I272 Pulmonary hypertension, unspecified: Secondary | ICD-10-CM | POA: Diagnosis not present

## 2018-04-11 DIAGNOSIS — N186 End stage renal disease: Secondary | ICD-10-CM | POA: Diagnosis not present

## 2018-04-11 DIAGNOSIS — E1122 Type 2 diabetes mellitus with diabetic chronic kidney disease: Secondary | ICD-10-CM | POA: Diagnosis not present

## 2018-04-11 DIAGNOSIS — I132 Hypertensive heart and chronic kidney disease with heart failure and with stage 5 chronic kidney disease, or end stage renal disease: Secondary | ICD-10-CM | POA: Diagnosis not present

## 2018-04-11 DIAGNOSIS — N2581 Secondary hyperparathyroidism of renal origin: Secondary | ICD-10-CM | POA: Diagnosis not present

## 2018-04-11 DIAGNOSIS — D631 Anemia in chronic kidney disease: Secondary | ICD-10-CM | POA: Diagnosis not present

## 2018-04-11 DIAGNOSIS — I5022 Chronic systolic (congestive) heart failure: Secondary | ICD-10-CM | POA: Diagnosis not present

## 2018-04-14 DIAGNOSIS — N2581 Secondary hyperparathyroidism of renal origin: Secondary | ICD-10-CM | POA: Diagnosis not present

## 2018-04-14 DIAGNOSIS — N186 End stage renal disease: Secondary | ICD-10-CM | POA: Diagnosis not present

## 2018-04-15 DIAGNOSIS — I132 Hypertensive heart and chronic kidney disease with heart failure and with stage 5 chronic kidney disease, or end stage renal disease: Secondary | ICD-10-CM | POA: Diagnosis not present

## 2018-04-15 DIAGNOSIS — E1122 Type 2 diabetes mellitus with diabetic chronic kidney disease: Secondary | ICD-10-CM | POA: Diagnosis not present

## 2018-04-15 DIAGNOSIS — I272 Pulmonary hypertension, unspecified: Secondary | ICD-10-CM | POA: Diagnosis not present

## 2018-04-15 DIAGNOSIS — D631 Anemia in chronic kidney disease: Secondary | ICD-10-CM | POA: Diagnosis not present

## 2018-04-15 DIAGNOSIS — I5022 Chronic systolic (congestive) heart failure: Secondary | ICD-10-CM | POA: Diagnosis not present

## 2018-04-15 DIAGNOSIS — N186 End stage renal disease: Secondary | ICD-10-CM | POA: Diagnosis not present

## 2018-04-16 DIAGNOSIS — N186 End stage renal disease: Secondary | ICD-10-CM | POA: Diagnosis not present

## 2018-04-16 DIAGNOSIS — N2581 Secondary hyperparathyroidism of renal origin: Secondary | ICD-10-CM | POA: Diagnosis not present

## 2018-04-18 DIAGNOSIS — N186 End stage renal disease: Secondary | ICD-10-CM | POA: Diagnosis not present

## 2018-04-18 DIAGNOSIS — N2581 Secondary hyperparathyroidism of renal origin: Secondary | ICD-10-CM | POA: Diagnosis not present

## 2018-04-19 DIAGNOSIS — E785 Hyperlipidemia, unspecified: Secondary | ICD-10-CM | POA: Diagnosis not present

## 2018-04-19 DIAGNOSIS — N186 End stage renal disease: Secondary | ICD-10-CM | POA: Diagnosis not present

## 2018-04-19 DIAGNOSIS — I129 Hypertensive chronic kidney disease with stage 1 through stage 4 chronic kidney disease, or unspecified chronic kidney disease: Secondary | ICD-10-CM | POA: Diagnosis not present

## 2018-04-19 DIAGNOSIS — R0989 Other specified symptoms and signs involving the circulatory and respiratory systems: Secondary | ICD-10-CM | POA: Diagnosis not present

## 2018-04-19 DIAGNOSIS — I120 Hypertensive chronic kidney disease with stage 5 chronic kidney disease or end stage renal disease: Secondary | ICD-10-CM | POA: Diagnosis not present

## 2018-04-19 DIAGNOSIS — R079 Chest pain, unspecified: Secondary | ICD-10-CM | POA: Diagnosis not present

## 2018-04-19 DIAGNOSIS — J90 Pleural effusion, not elsewhere classified: Secondary | ICD-10-CM | POA: Diagnosis not present

## 2018-04-19 DIAGNOSIS — I2510 Atherosclerotic heart disease of native coronary artery without angina pectoris: Secondary | ICD-10-CM | POA: Diagnosis not present

## 2018-04-19 DIAGNOSIS — I5022 Chronic systolic (congestive) heart failure: Secondary | ICD-10-CM | POA: Diagnosis not present

## 2018-04-19 DIAGNOSIS — Z743 Need for continuous supervision: Secondary | ICD-10-CM | POA: Diagnosis not present

## 2018-04-19 DIAGNOSIS — R0789 Other chest pain: Secondary | ICD-10-CM | POA: Diagnosis not present

## 2018-04-21 DIAGNOSIS — N2581 Secondary hyperparathyroidism of renal origin: Secondary | ICD-10-CM | POA: Diagnosis not present

## 2018-04-21 DIAGNOSIS — N186 End stage renal disease: Secondary | ICD-10-CM | POA: Diagnosis not present

## 2018-04-22 DIAGNOSIS — Z86718 Personal history of other venous thrombosis and embolism: Secondary | ICD-10-CM | POA: Diagnosis not present

## 2018-04-22 DIAGNOSIS — R110 Nausea: Secondary | ICD-10-CM | POA: Diagnosis not present

## 2018-04-22 DIAGNOSIS — Z9861 Coronary angioplasty status: Secondary | ICD-10-CM | POA: Diagnosis not present

## 2018-04-22 DIAGNOSIS — E785 Hyperlipidemia, unspecified: Secondary | ICD-10-CM | POA: Diagnosis not present

## 2018-04-22 DIAGNOSIS — Z7982 Long term (current) use of aspirin: Secondary | ICD-10-CM | POA: Diagnosis not present

## 2018-04-22 DIAGNOSIS — I132 Hypertensive heart and chronic kidney disease with heart failure and with stage 5 chronic kidney disease, or end stage renal disease: Secondary | ICD-10-CM | POA: Diagnosis not present

## 2018-04-22 DIAGNOSIS — I5023 Acute on chronic systolic (congestive) heart failure: Secondary | ICD-10-CM | POA: Diagnosis not present

## 2018-04-22 DIAGNOSIS — Z8249 Family history of ischemic heart disease and other diseases of the circulatory system: Secondary | ICD-10-CM | POA: Diagnosis not present

## 2018-04-22 DIAGNOSIS — Z992 Dependence on renal dialysis: Secondary | ICD-10-CM | POA: Diagnosis not present

## 2018-04-22 DIAGNOSIS — Z833 Family history of diabetes mellitus: Secondary | ICD-10-CM | POA: Diagnosis not present

## 2018-04-22 DIAGNOSIS — I340 Nonrheumatic mitral (valve) insufficiency: Secondary | ICD-10-CM | POA: Diagnosis not present

## 2018-04-22 DIAGNOSIS — R079 Chest pain, unspecified: Secondary | ICD-10-CM | POA: Diagnosis not present

## 2018-04-22 DIAGNOSIS — I120 Hypertensive chronic kidney disease with stage 5 chronic kidney disease or end stage renal disease: Secondary | ICD-10-CM | POA: Diagnosis not present

## 2018-04-22 DIAGNOSIS — E1122 Type 2 diabetes mellitus with diabetic chronic kidney disease: Secondary | ICD-10-CM | POA: Diagnosis not present

## 2018-04-22 DIAGNOSIS — I272 Pulmonary hypertension, unspecified: Secondary | ICD-10-CM | POA: Diagnosis not present

## 2018-04-22 DIAGNOSIS — I081 Rheumatic disorders of both mitral and tricuspid valves: Secondary | ICD-10-CM | POA: Diagnosis not present

## 2018-04-22 DIAGNOSIS — Z89429 Acquired absence of other toe(s), unspecified side: Secondary | ICD-10-CM | POA: Diagnosis not present

## 2018-04-22 DIAGNOSIS — Z7902 Long term (current) use of antithrombotics/antiplatelets: Secondary | ICD-10-CM | POA: Diagnosis not present

## 2018-04-22 DIAGNOSIS — I252 Old myocardial infarction: Secondary | ICD-10-CM | POA: Diagnosis not present

## 2018-04-22 DIAGNOSIS — I255 Ischemic cardiomyopathy: Secondary | ICD-10-CM | POA: Diagnosis not present

## 2018-04-22 DIAGNOSIS — J90 Pleural effusion, not elsewhere classified: Secondary | ICD-10-CM | POA: Diagnosis not present

## 2018-04-22 DIAGNOSIS — Z951 Presence of aortocoronary bypass graft: Secondary | ICD-10-CM | POA: Diagnosis not present

## 2018-04-22 DIAGNOSIS — I2722 Pulmonary hypertension due to left heart disease: Secondary | ICD-10-CM | POA: Diagnosis not present

## 2018-04-22 DIAGNOSIS — R0981 Nasal congestion: Secondary | ICD-10-CM | POA: Diagnosis not present

## 2018-04-22 DIAGNOSIS — D638 Anemia in other chronic diseases classified elsewhere: Secondary | ICD-10-CM | POA: Diagnosis not present

## 2018-04-22 DIAGNOSIS — J9811 Atelectasis: Secondary | ICD-10-CM | POA: Diagnosis not present

## 2018-04-22 DIAGNOSIS — Z7689 Persons encountering health services in other specified circumstances: Secondary | ICD-10-CM | POA: Diagnosis not present

## 2018-04-22 DIAGNOSIS — R0789 Other chest pain: Secondary | ICD-10-CM | POA: Diagnosis not present

## 2018-04-22 DIAGNOSIS — R071 Chest pain on breathing: Secondary | ICD-10-CM | POA: Diagnosis not present

## 2018-04-22 DIAGNOSIS — Z79899 Other long term (current) drug therapy: Secondary | ICD-10-CM | POA: Diagnosis not present

## 2018-04-22 DIAGNOSIS — R072 Precordial pain: Secondary | ICD-10-CM | POA: Diagnosis not present

## 2018-04-22 DIAGNOSIS — R9431 Abnormal electrocardiogram [ECG] [EKG]: Secondary | ICD-10-CM | POA: Diagnosis not present

## 2018-04-22 DIAGNOSIS — N186 End stage renal disease: Secondary | ICD-10-CM | POA: Diagnosis not present

## 2018-04-22 DIAGNOSIS — I2510 Atherosclerotic heart disease of native coronary artery without angina pectoris: Secondary | ICD-10-CM | POA: Diagnosis not present

## 2018-04-22 DIAGNOSIS — I517 Cardiomegaly: Secondary | ICD-10-CM | POA: Diagnosis not present

## 2018-04-23 DIAGNOSIS — N186 End stage renal disease: Secondary | ICD-10-CM | POA: Diagnosis not present

## 2018-04-23 DIAGNOSIS — I340 Nonrheumatic mitral (valve) insufficiency: Secondary | ICD-10-CM | POA: Diagnosis not present

## 2018-04-23 DIAGNOSIS — Z794 Long term (current) use of insulin: Secondary | ICD-10-CM | POA: Diagnosis not present

## 2018-04-23 DIAGNOSIS — R071 Chest pain on breathing: Secondary | ICD-10-CM | POA: Diagnosis not present

## 2018-04-23 DIAGNOSIS — R079 Chest pain, unspecified: Secondary | ICD-10-CM | POA: Diagnosis not present

## 2018-04-23 DIAGNOSIS — I272 Pulmonary hypertension, unspecified: Secondary | ICD-10-CM | POA: Diagnosis not present

## 2018-04-23 DIAGNOSIS — I255 Ischemic cardiomyopathy: Secondary | ICD-10-CM | POA: Diagnosis not present

## 2018-04-23 DIAGNOSIS — I2510 Atherosclerotic heart disease of native coronary artery without angina pectoris: Secondary | ICD-10-CM | POA: Diagnosis not present

## 2018-04-23 DIAGNOSIS — I120 Hypertensive chronic kidney disease with stage 5 chronic kidney disease or end stage renal disease: Secondary | ICD-10-CM | POA: Diagnosis not present

## 2018-04-23 DIAGNOSIS — E1165 Type 2 diabetes mellitus with hyperglycemia: Secondary | ICD-10-CM | POA: Diagnosis not present

## 2018-04-24 DIAGNOSIS — Z794 Long term (current) use of insulin: Secondary | ICD-10-CM | POA: Diagnosis not present

## 2018-04-24 DIAGNOSIS — N186 End stage renal disease: Secondary | ICD-10-CM | POA: Diagnosis not present

## 2018-04-24 DIAGNOSIS — I120 Hypertensive chronic kidney disease with stage 5 chronic kidney disease or end stage renal disease: Secondary | ICD-10-CM | POA: Diagnosis not present

## 2018-04-24 DIAGNOSIS — I340 Nonrheumatic mitral (valve) insufficiency: Secondary | ICD-10-CM | POA: Diagnosis not present

## 2018-04-24 DIAGNOSIS — R071 Chest pain on breathing: Secondary | ICD-10-CM | POA: Diagnosis not present

## 2018-04-24 DIAGNOSIS — I2510 Atherosclerotic heart disease of native coronary artery without angina pectoris: Secondary | ICD-10-CM | POA: Diagnosis not present

## 2018-04-24 DIAGNOSIS — E1165 Type 2 diabetes mellitus with hyperglycemia: Secondary | ICD-10-CM | POA: Diagnosis not present

## 2018-04-25 DIAGNOSIS — N186 End stage renal disease: Secondary | ICD-10-CM | POA: Diagnosis not present

## 2018-04-25 DIAGNOSIS — I5022 Chronic systolic (congestive) heart failure: Secondary | ICD-10-CM | POA: Diagnosis not present

## 2018-04-25 DIAGNOSIS — E1122 Type 2 diabetes mellitus with diabetic chronic kidney disease: Secondary | ICD-10-CM | POA: Diagnosis not present

## 2018-04-25 DIAGNOSIS — N2581 Secondary hyperparathyroidism of renal origin: Secondary | ICD-10-CM | POA: Diagnosis not present

## 2018-04-25 DIAGNOSIS — I272 Pulmonary hypertension, unspecified: Secondary | ICD-10-CM | POA: Diagnosis not present

## 2018-04-25 DIAGNOSIS — I132 Hypertensive heart and chronic kidney disease with heart failure and with stage 5 chronic kidney disease, or end stage renal disease: Secondary | ICD-10-CM | POA: Diagnosis not present

## 2018-04-25 DIAGNOSIS — D631 Anemia in chronic kidney disease: Secondary | ICD-10-CM | POA: Diagnosis not present

## 2018-04-28 DIAGNOSIS — N2581 Secondary hyperparathyroidism of renal origin: Secondary | ICD-10-CM | POA: Diagnosis not present

## 2018-04-28 DIAGNOSIS — N186 End stage renal disease: Secondary | ICD-10-CM | POA: Diagnosis not present

## 2018-04-29 DIAGNOSIS — D631 Anemia in chronic kidney disease: Secondary | ICD-10-CM | POA: Diagnosis not present

## 2018-04-29 DIAGNOSIS — I132 Hypertensive heart and chronic kidney disease with heart failure and with stage 5 chronic kidney disease, or end stage renal disease: Secondary | ICD-10-CM | POA: Diagnosis not present

## 2018-04-29 DIAGNOSIS — E1122 Type 2 diabetes mellitus with diabetic chronic kidney disease: Secondary | ICD-10-CM | POA: Diagnosis not present

## 2018-04-29 DIAGNOSIS — I5022 Chronic systolic (congestive) heart failure: Secondary | ICD-10-CM | POA: Diagnosis not present

## 2018-04-29 DIAGNOSIS — N186 End stage renal disease: Secondary | ICD-10-CM | POA: Diagnosis not present

## 2018-04-29 DIAGNOSIS — I272 Pulmonary hypertension, unspecified: Secondary | ICD-10-CM | POA: Diagnosis not present

## 2018-04-30 DIAGNOSIS — N2581 Secondary hyperparathyroidism of renal origin: Secondary | ICD-10-CM | POA: Diagnosis not present

## 2018-04-30 DIAGNOSIS — N186 End stage renal disease: Secondary | ICD-10-CM | POA: Diagnosis not present

## 2018-05-01 DIAGNOSIS — I5022 Chronic systolic (congestive) heart failure: Secondary | ICD-10-CM | POA: Diagnosis not present

## 2018-05-01 DIAGNOSIS — I469 Cardiac arrest, cause unspecified: Secondary | ICD-10-CM | POA: Diagnosis not present

## 2018-05-01 DIAGNOSIS — I2510 Atherosclerotic heart disease of native coronary artery without angina pectoris: Secondary | ICD-10-CM | POA: Diagnosis not present

## 2018-05-01 DIAGNOSIS — I340 Nonrheumatic mitral (valve) insufficiency: Secondary | ICD-10-CM | POA: Diagnosis not present

## 2018-05-01 DIAGNOSIS — G8929 Other chronic pain: Secondary | ICD-10-CM | POA: Diagnosis not present

## 2018-05-01 DIAGNOSIS — I255 Ischemic cardiomyopathy: Secondary | ICD-10-CM | POA: Diagnosis not present

## 2018-05-01 DIAGNOSIS — G4700 Insomnia, unspecified: Secondary | ICD-10-CM | POA: Diagnosis not present

## 2018-05-02 DIAGNOSIS — N2581 Secondary hyperparathyroidism of renal origin: Secondary | ICD-10-CM | POA: Diagnosis not present

## 2018-05-02 DIAGNOSIS — N186 End stage renal disease: Secondary | ICD-10-CM | POA: Diagnosis not present

## 2018-05-03 DIAGNOSIS — N186 End stage renal disease: Secondary | ICD-10-CM | POA: Diagnosis not present

## 2018-05-04 DIAGNOSIS — Z23 Encounter for immunization: Secondary | ICD-10-CM | POA: Diagnosis not present

## 2018-05-04 DIAGNOSIS — N2581 Secondary hyperparathyroidism of renal origin: Secondary | ICD-10-CM | POA: Diagnosis not present

## 2018-05-04 DIAGNOSIS — N186 End stage renal disease: Secondary | ICD-10-CM | POA: Diagnosis not present

## 2018-05-04 DIAGNOSIS — D631 Anemia in chronic kidney disease: Secondary | ICD-10-CM | POA: Diagnosis not present

## 2018-05-06 DIAGNOSIS — E1122 Type 2 diabetes mellitus with diabetic chronic kidney disease: Secondary | ICD-10-CM | POA: Diagnosis not present

## 2018-05-06 DIAGNOSIS — I272 Pulmonary hypertension, unspecified: Secondary | ICD-10-CM | POA: Diagnosis not present

## 2018-05-06 DIAGNOSIS — I132 Hypertensive heart and chronic kidney disease with heart failure and with stage 5 chronic kidney disease, or end stage renal disease: Secondary | ICD-10-CM | POA: Diagnosis not present

## 2018-05-06 DIAGNOSIS — D631 Anemia in chronic kidney disease: Secondary | ICD-10-CM | POA: Diagnosis not present

## 2018-05-06 DIAGNOSIS — N186 End stage renal disease: Secondary | ICD-10-CM | POA: Diagnosis not present

## 2018-05-06 DIAGNOSIS — I5022 Chronic systolic (congestive) heart failure: Secondary | ICD-10-CM | POA: Diagnosis not present

## 2018-05-07 DIAGNOSIS — Z23 Encounter for immunization: Secondary | ICD-10-CM | POA: Diagnosis not present

## 2018-05-07 DIAGNOSIS — N186 End stage renal disease: Secondary | ICD-10-CM | POA: Diagnosis not present

## 2018-05-07 DIAGNOSIS — D631 Anemia in chronic kidney disease: Secondary | ICD-10-CM | POA: Diagnosis not present

## 2018-05-07 DIAGNOSIS — N2581 Secondary hyperparathyroidism of renal origin: Secondary | ICD-10-CM | POA: Diagnosis not present

## 2018-05-08 DIAGNOSIS — I132 Hypertensive heart and chronic kidney disease with heart failure and with stage 5 chronic kidney disease, or end stage renal disease: Secondary | ICD-10-CM | POA: Diagnosis not present

## 2018-05-08 DIAGNOSIS — R0600 Dyspnea, unspecified: Secondary | ICD-10-CM | POA: Diagnosis not present

## 2018-05-08 DIAGNOSIS — I517 Cardiomegaly: Secondary | ICD-10-CM | POA: Diagnosis not present

## 2018-05-08 DIAGNOSIS — N186 End stage renal disease: Secondary | ICD-10-CM | POA: Diagnosis not present

## 2018-05-08 DIAGNOSIS — I2722 Pulmonary hypertension due to left heart disease: Secondary | ICD-10-CM | POA: Diagnosis not present

## 2018-05-08 DIAGNOSIS — I10 Essential (primary) hypertension: Secondary | ICD-10-CM | POA: Diagnosis not present

## 2018-05-08 DIAGNOSIS — E1122 Type 2 diabetes mellitus with diabetic chronic kidney disease: Secondary | ICD-10-CM | POA: Diagnosis not present

## 2018-05-08 DIAGNOSIS — R079 Chest pain, unspecified: Secondary | ICD-10-CM | POA: Diagnosis not present

## 2018-05-08 DIAGNOSIS — I5022 Chronic systolic (congestive) heart failure: Secondary | ICD-10-CM | POA: Diagnosis not present

## 2018-05-08 DIAGNOSIS — R918 Other nonspecific abnormal finding of lung field: Secondary | ICD-10-CM | POA: Diagnosis not present

## 2018-05-08 DIAGNOSIS — R000 Tachycardia, unspecified: Secondary | ICD-10-CM | POA: Diagnosis not present

## 2018-05-08 DIAGNOSIS — Z743 Need for continuous supervision: Secondary | ICD-10-CM | POA: Diagnosis not present

## 2018-05-09 DIAGNOSIS — I2722 Pulmonary hypertension due to left heart disease: Secondary | ICD-10-CM | POA: Diagnosis present

## 2018-05-09 DIAGNOSIS — I313 Pericardial effusion (noninflammatory): Secondary | ICD-10-CM | POA: Diagnosis not present

## 2018-05-09 DIAGNOSIS — D631 Anemia in chronic kidney disease: Secondary | ICD-10-CM | POA: Diagnosis present

## 2018-05-09 DIAGNOSIS — J90 Pleural effusion, not elsewhere classified: Secondary | ICD-10-CM | POA: Diagnosis not present

## 2018-05-09 DIAGNOSIS — R748 Abnormal levels of other serum enzymes: Secondary | ICD-10-CM | POA: Diagnosis present

## 2018-05-09 DIAGNOSIS — Z79891 Long term (current) use of opiate analgesic: Secondary | ICD-10-CM | POA: Diagnosis not present

## 2018-05-09 DIAGNOSIS — I2119 ST elevation (STEMI) myocardial infarction involving other coronary artery of inferior wall: Secondary | ICD-10-CM | POA: Diagnosis not present

## 2018-05-09 DIAGNOSIS — I081 Rheumatic disorders of both mitral and tricuspid valves: Secondary | ICD-10-CM | POA: Diagnosis present

## 2018-05-09 DIAGNOSIS — I5022 Chronic systolic (congestive) heart failure: Secondary | ICD-10-CM | POA: Diagnosis present

## 2018-05-09 DIAGNOSIS — G8929 Other chronic pain: Secondary | ICD-10-CM | POA: Diagnosis present

## 2018-05-09 DIAGNOSIS — I517 Cardiomegaly: Secondary | ICD-10-CM | POA: Diagnosis not present

## 2018-05-09 DIAGNOSIS — E785 Hyperlipidemia, unspecified: Secondary | ICD-10-CM | POA: Diagnosis not present

## 2018-05-09 DIAGNOSIS — Z7902 Long term (current) use of antithrombotics/antiplatelets: Secondary | ICD-10-CM | POA: Diagnosis not present

## 2018-05-09 DIAGNOSIS — Z7982 Long term (current) use of aspirin: Secondary | ICD-10-CM | POA: Diagnosis not present

## 2018-05-09 DIAGNOSIS — I120 Hypertensive chronic kidney disease with stage 5 chronic kidney disease or end stage renal disease: Secondary | ICD-10-CM | POA: Diagnosis not present

## 2018-05-09 DIAGNOSIS — Z992 Dependence on renal dialysis: Secondary | ICD-10-CM | POA: Diagnosis not present

## 2018-05-09 DIAGNOSIS — E1122 Type 2 diabetes mellitus with diabetic chronic kidney disease: Secondary | ICD-10-CM | POA: Diagnosis present

## 2018-05-09 DIAGNOSIS — N186 End stage renal disease: Secondary | ICD-10-CM | POA: Diagnosis present

## 2018-05-09 DIAGNOSIS — Z951 Presence of aortocoronary bypass graft: Secondary | ICD-10-CM | POA: Diagnosis not present

## 2018-05-09 DIAGNOSIS — D649 Anemia, unspecified: Secondary | ICD-10-CM | POA: Diagnosis not present

## 2018-05-09 DIAGNOSIS — Z86718 Personal history of other venous thrombosis and embolism: Secondary | ICD-10-CM | POA: Diagnosis not present

## 2018-05-09 DIAGNOSIS — I252 Old myocardial infarction: Secondary | ICD-10-CM | POA: Diagnosis not present

## 2018-05-09 DIAGNOSIS — I2510 Atherosclerotic heart disease of native coronary artery without angina pectoris: Secondary | ICD-10-CM | POA: Diagnosis present

## 2018-05-09 DIAGNOSIS — Z48812 Encounter for surgical aftercare following surgery on the circulatory system: Secondary | ICD-10-CM | POA: Diagnosis not present

## 2018-05-09 DIAGNOSIS — Z8674 Personal history of sudden cardiac arrest: Secondary | ICD-10-CM | POA: Diagnosis not present

## 2018-05-09 DIAGNOSIS — R079 Chest pain, unspecified: Secondary | ICD-10-CM | POA: Diagnosis present

## 2018-05-09 DIAGNOSIS — I10 Essential (primary) hypertension: Secondary | ICD-10-CM | POA: Diagnosis not present

## 2018-05-09 DIAGNOSIS — Z7952 Long term (current) use of systemic steroids: Secondary | ICD-10-CM | POA: Diagnosis not present

## 2018-05-09 DIAGNOSIS — Z79899 Other long term (current) drug therapy: Secondary | ICD-10-CM | POA: Diagnosis not present

## 2018-05-09 DIAGNOSIS — I255 Ischemic cardiomyopathy: Secondary | ICD-10-CM | POA: Diagnosis present

## 2018-05-09 DIAGNOSIS — R9431 Abnormal electrocardiogram [ECG] [EKG]: Secondary | ICD-10-CM | POA: Diagnosis not present

## 2018-05-09 DIAGNOSIS — I132 Hypertensive heart and chronic kidney disease with heart failure and with stage 5 chronic kidney disease, or end stage renal disease: Secondary | ICD-10-CM | POA: Diagnosis present

## 2018-05-09 DIAGNOSIS — E782 Mixed hyperlipidemia: Secondary | ICD-10-CM | POA: Diagnosis present

## 2018-05-09 DIAGNOSIS — R0600 Dyspnea, unspecified: Secondary | ICD-10-CM | POA: Diagnosis present

## 2018-05-12 DIAGNOSIS — D631 Anemia in chronic kidney disease: Secondary | ICD-10-CM | POA: Diagnosis not present

## 2018-05-12 DIAGNOSIS — N186 End stage renal disease: Secondary | ICD-10-CM | POA: Diagnosis not present

## 2018-05-12 DIAGNOSIS — N2581 Secondary hyperparathyroidism of renal origin: Secondary | ICD-10-CM | POA: Diagnosis not present

## 2018-05-12 DIAGNOSIS — Z23 Encounter for immunization: Secondary | ICD-10-CM | POA: Diagnosis not present

## 2018-05-13 DIAGNOSIS — E0840 Diabetes mellitus due to underlying condition with diabetic neuropathy, unspecified: Secondary | ICD-10-CM | POA: Diagnosis not present

## 2018-05-13 DIAGNOSIS — N186 End stage renal disease: Secondary | ICD-10-CM | POA: Diagnosis not present

## 2018-05-13 DIAGNOSIS — I10 Essential (primary) hypertension: Secondary | ICD-10-CM | POA: Diagnosis not present

## 2018-05-13 DIAGNOSIS — Z7982 Long term (current) use of aspirin: Secondary | ICD-10-CM | POA: Diagnosis not present

## 2018-05-13 DIAGNOSIS — I2510 Atherosclerotic heart disease of native coronary artery without angina pectoris: Secondary | ICD-10-CM | POA: Diagnosis not present

## 2018-05-13 DIAGNOSIS — I255 Ischemic cardiomyopathy: Secondary | ICD-10-CM | POA: Diagnosis not present

## 2018-05-13 DIAGNOSIS — I2583 Coronary atherosclerosis due to lipid rich plaque: Secondary | ICD-10-CM | POA: Diagnosis not present

## 2018-05-13 DIAGNOSIS — Z86718 Personal history of other venous thrombosis and embolism: Secondary | ICD-10-CM | POA: Diagnosis not present

## 2018-05-13 DIAGNOSIS — R0789 Other chest pain: Secondary | ICD-10-CM | POA: Diagnosis not present

## 2018-05-13 DIAGNOSIS — I5022 Chronic systolic (congestive) heart failure: Secondary | ICD-10-CM | POA: Diagnosis not present

## 2018-05-13 DIAGNOSIS — I272 Pulmonary hypertension, unspecified: Secondary | ICD-10-CM | POA: Diagnosis not present

## 2018-05-13 DIAGNOSIS — I081 Rheumatic disorders of both mitral and tricuspid valves: Secondary | ICD-10-CM | POA: Diagnosis not present

## 2018-05-13 DIAGNOSIS — E1122 Type 2 diabetes mellitus with diabetic chronic kidney disease: Secondary | ICD-10-CM | POA: Diagnosis not present

## 2018-05-13 DIAGNOSIS — D631 Anemia in chronic kidney disease: Secondary | ICD-10-CM | POA: Diagnosis not present

## 2018-05-13 DIAGNOSIS — Z794 Long term (current) use of insulin: Secondary | ICD-10-CM | POA: Diagnosis not present

## 2018-05-13 DIAGNOSIS — Z743 Need for continuous supervision: Secondary | ICD-10-CM | POA: Diagnosis not present

## 2018-05-13 DIAGNOSIS — R079 Chest pain, unspecified: Secondary | ICD-10-CM | POA: Diagnosis not present

## 2018-05-13 DIAGNOSIS — I252 Old myocardial infarction: Secondary | ICD-10-CM | POA: Diagnosis not present

## 2018-05-13 DIAGNOSIS — Z992 Dependence on renal dialysis: Secondary | ICD-10-CM | POA: Diagnosis not present

## 2018-05-13 DIAGNOSIS — Z951 Presence of aortocoronary bypass graft: Secondary | ICD-10-CM | POA: Diagnosis not present

## 2018-05-13 DIAGNOSIS — I2722 Pulmonary hypertension due to left heart disease: Secondary | ICD-10-CM | POA: Diagnosis not present

## 2018-05-13 DIAGNOSIS — I509 Heart failure, unspecified: Secondary | ICD-10-CM | POA: Diagnosis not present

## 2018-05-13 DIAGNOSIS — I132 Hypertensive heart and chronic kidney disease with heart failure and with stage 5 chronic kidney disease, or end stage renal disease: Secondary | ICD-10-CM | POA: Diagnosis not present

## 2018-05-13 DIAGNOSIS — D638 Anemia in other chronic diseases classified elsewhere: Secondary | ICD-10-CM | POA: Diagnosis not present

## 2018-05-13 DIAGNOSIS — E785 Hyperlipidemia, unspecified: Secondary | ICD-10-CM | POA: Diagnosis not present

## 2018-05-13 DIAGNOSIS — Z7902 Long term (current) use of antithrombotics/antiplatelets: Secondary | ICD-10-CM | POA: Diagnosis not present

## 2018-05-13 DIAGNOSIS — Z89429 Acquired absence of other toe(s), unspecified side: Secondary | ICD-10-CM | POA: Diagnosis not present

## 2018-05-13 DIAGNOSIS — R112 Nausea with vomiting, unspecified: Secondary | ICD-10-CM | POA: Diagnosis not present

## 2018-05-14 DIAGNOSIS — R9431 Abnormal electrocardiogram [ECG] [EKG]: Secondary | ICD-10-CM | POA: Diagnosis not present

## 2018-05-14 DIAGNOSIS — J90 Pleural effusion, not elsewhere classified: Secondary | ICD-10-CM | POA: Diagnosis not present

## 2018-05-14 DIAGNOSIS — R918 Other nonspecific abnormal finding of lung field: Secondary | ICD-10-CM | POA: Diagnosis not present

## 2018-05-14 DIAGNOSIS — R9439 Abnormal result of other cardiovascular function study: Secondary | ICD-10-CM | POA: Diagnosis not present

## 2018-05-14 DIAGNOSIS — I120 Hypertensive chronic kidney disease with stage 5 chronic kidney disease or end stage renal disease: Secondary | ICD-10-CM | POA: Diagnosis not present

## 2018-05-14 DIAGNOSIS — I2510 Atherosclerotic heart disease of native coronary artery without angina pectoris: Secondary | ICD-10-CM | POA: Diagnosis not present

## 2018-05-14 DIAGNOSIS — R0989 Other specified symptoms and signs involving the circulatory and respiratory systems: Secondary | ICD-10-CM | POA: Diagnosis not present

## 2018-05-14 DIAGNOSIS — R079 Chest pain, unspecified: Secondary | ICD-10-CM | POA: Diagnosis not present

## 2018-05-14 DIAGNOSIS — I255 Ischemic cardiomyopathy: Secondary | ICD-10-CM | POA: Diagnosis not present

## 2018-05-14 DIAGNOSIS — E119 Type 2 diabetes mellitus without complications: Secondary | ICD-10-CM | POA: Diagnosis not present

## 2018-05-14 DIAGNOSIS — I10 Essential (primary) hypertension: Secondary | ICD-10-CM | POA: Diagnosis not present

## 2018-05-14 DIAGNOSIS — N186 End stage renal disease: Secondary | ICD-10-CM | POA: Diagnosis not present

## 2018-05-15 DIAGNOSIS — I10 Essential (primary) hypertension: Secondary | ICD-10-CM | POA: Diagnosis not present

## 2018-05-15 DIAGNOSIS — I255 Ischemic cardiomyopathy: Secondary | ICD-10-CM | POA: Diagnosis not present

## 2018-05-15 DIAGNOSIS — I120 Hypertensive chronic kidney disease with stage 5 chronic kidney disease or end stage renal disease: Secondary | ICD-10-CM | POA: Diagnosis not present

## 2018-05-15 DIAGNOSIS — E119 Type 2 diabetes mellitus without complications: Secondary | ICD-10-CM | POA: Diagnosis not present

## 2018-05-15 DIAGNOSIS — N186 End stage renal disease: Secondary | ICD-10-CM | POA: Diagnosis not present

## 2018-05-15 DIAGNOSIS — R079 Chest pain, unspecified: Secondary | ICD-10-CM | POA: Diagnosis not present

## 2018-05-15 DIAGNOSIS — R9439 Abnormal result of other cardiovascular function study: Secondary | ICD-10-CM | POA: Diagnosis not present

## 2018-05-15 DIAGNOSIS — I2510 Atherosclerotic heart disease of native coronary artery without angina pectoris: Secondary | ICD-10-CM | POA: Diagnosis not present

## 2018-05-16 DIAGNOSIS — Z23 Encounter for immunization: Secondary | ICD-10-CM | POA: Diagnosis not present

## 2018-05-16 DIAGNOSIS — N186 End stage renal disease: Secondary | ICD-10-CM | POA: Diagnosis not present

## 2018-05-16 DIAGNOSIS — D631 Anemia in chronic kidney disease: Secondary | ICD-10-CM | POA: Diagnosis not present

## 2018-05-16 DIAGNOSIS — N2581 Secondary hyperparathyroidism of renal origin: Secondary | ICD-10-CM | POA: Diagnosis not present

## 2018-05-17 DIAGNOSIS — Z7982 Long term (current) use of aspirin: Secondary | ICD-10-CM | POA: Diagnosis not present

## 2018-05-17 DIAGNOSIS — I2510 Atherosclerotic heart disease of native coronary artery without angina pectoris: Secondary | ICD-10-CM | POA: Diagnosis not present

## 2018-05-17 DIAGNOSIS — E11319 Type 2 diabetes mellitus with unspecified diabetic retinopathy without macular edema: Secondary | ICD-10-CM | POA: Diagnosis not present

## 2018-05-17 DIAGNOSIS — Z955 Presence of coronary angioplasty implant and graft: Secondary | ICD-10-CM | POA: Diagnosis not present

## 2018-05-17 DIAGNOSIS — D631 Anemia in chronic kidney disease: Secondary | ICD-10-CM | POA: Diagnosis not present

## 2018-05-17 DIAGNOSIS — E1122 Type 2 diabetes mellitus with diabetic chronic kidney disease: Secondary | ICD-10-CM | POA: Diagnosis not present

## 2018-05-17 DIAGNOSIS — I252 Old myocardial infarction: Secondary | ICD-10-CM | POA: Diagnosis not present

## 2018-05-17 DIAGNOSIS — K922 Gastrointestinal hemorrhage, unspecified: Secondary | ICD-10-CM | POA: Diagnosis not present

## 2018-05-17 DIAGNOSIS — Z7902 Long term (current) use of antithrombotics/antiplatelets: Secondary | ICD-10-CM | POA: Diagnosis not present

## 2018-05-17 DIAGNOSIS — N186 End stage renal disease: Secondary | ICD-10-CM | POA: Diagnosis not present

## 2018-05-17 DIAGNOSIS — K920 Hematemesis: Secondary | ICD-10-CM | POA: Diagnosis not present

## 2018-05-17 DIAGNOSIS — E1140 Type 2 diabetes mellitus with diabetic neuropathy, unspecified: Secondary | ICD-10-CM | POA: Diagnosis not present

## 2018-05-17 DIAGNOSIS — Z86718 Personal history of other venous thrombosis and embolism: Secondary | ICD-10-CM | POA: Diagnosis not present

## 2018-05-17 DIAGNOSIS — E785 Hyperlipidemia, unspecified: Secondary | ICD-10-CM | POA: Diagnosis not present

## 2018-05-17 DIAGNOSIS — I272 Pulmonary hypertension, unspecified: Secondary | ICD-10-CM | POA: Diagnosis not present

## 2018-05-17 DIAGNOSIS — I132 Hypertensive heart and chronic kidney disease with heart failure and with stage 5 chronic kidney disease, or end stage renal disease: Secondary | ICD-10-CM | POA: Diagnosis not present

## 2018-05-17 DIAGNOSIS — Z951 Presence of aortocoronary bypass graft: Secondary | ICD-10-CM | POA: Diagnosis not present

## 2018-05-17 DIAGNOSIS — I081 Rheumatic disorders of both mitral and tricuspid valves: Secondary | ICD-10-CM | POA: Diagnosis not present

## 2018-05-17 DIAGNOSIS — Z992 Dependence on renal dialysis: Secondary | ICD-10-CM | POA: Diagnosis not present

## 2018-05-17 DIAGNOSIS — Z9181 History of falling: Secondary | ICD-10-CM | POA: Diagnosis not present

## 2018-05-17 DIAGNOSIS — I5022 Chronic systolic (congestive) heart failure: Secondary | ICD-10-CM | POA: Diagnosis not present

## 2018-05-19 DIAGNOSIS — N186 End stage renal disease: Secondary | ICD-10-CM | POA: Diagnosis not present

## 2018-05-19 DIAGNOSIS — N2581 Secondary hyperparathyroidism of renal origin: Secondary | ICD-10-CM | POA: Diagnosis not present

## 2018-05-19 DIAGNOSIS — D631 Anemia in chronic kidney disease: Secondary | ICD-10-CM | POA: Diagnosis not present

## 2018-05-19 DIAGNOSIS — Z23 Encounter for immunization: Secondary | ICD-10-CM | POA: Diagnosis not present

## 2018-05-21 DIAGNOSIS — Z23 Encounter for immunization: Secondary | ICD-10-CM | POA: Diagnosis not present

## 2018-05-21 DIAGNOSIS — D631 Anemia in chronic kidney disease: Secondary | ICD-10-CM | POA: Diagnosis not present

## 2018-05-21 DIAGNOSIS — N186 End stage renal disease: Secondary | ICD-10-CM | POA: Diagnosis not present

## 2018-05-21 DIAGNOSIS — N2581 Secondary hyperparathyroidism of renal origin: Secondary | ICD-10-CM | POA: Diagnosis not present

## 2018-05-22 DIAGNOSIS — B351 Tinea unguium: Secondary | ICD-10-CM | POA: Diagnosis not present

## 2018-05-22 DIAGNOSIS — E1159 Type 2 diabetes mellitus with other circulatory complications: Secondary | ICD-10-CM | POA: Diagnosis not present

## 2018-05-22 DIAGNOSIS — E1142 Type 2 diabetes mellitus with diabetic polyneuropathy: Secondary | ICD-10-CM | POA: Diagnosis not present

## 2018-05-23 DIAGNOSIS — N186 End stage renal disease: Secondary | ICD-10-CM | POA: Diagnosis not present

## 2018-05-23 DIAGNOSIS — Z23 Encounter for immunization: Secondary | ICD-10-CM | POA: Diagnosis not present

## 2018-05-23 DIAGNOSIS — I272 Pulmonary hypertension, unspecified: Secondary | ICD-10-CM | POA: Diagnosis not present

## 2018-05-23 DIAGNOSIS — E1122 Type 2 diabetes mellitus with diabetic chronic kidney disease: Secondary | ICD-10-CM | POA: Diagnosis not present

## 2018-05-23 DIAGNOSIS — D631 Anemia in chronic kidney disease: Secondary | ICD-10-CM | POA: Diagnosis not present

## 2018-05-23 DIAGNOSIS — I132 Hypertensive heart and chronic kidney disease with heart failure and with stage 5 chronic kidney disease, or end stage renal disease: Secondary | ICD-10-CM | POA: Diagnosis not present

## 2018-05-23 DIAGNOSIS — N2581 Secondary hyperparathyroidism of renal origin: Secondary | ICD-10-CM | POA: Diagnosis not present

## 2018-05-23 DIAGNOSIS — I5022 Chronic systolic (congestive) heart failure: Secondary | ICD-10-CM | POA: Diagnosis not present

## 2018-05-26 DIAGNOSIS — N186 End stage renal disease: Secondary | ICD-10-CM | POA: Diagnosis not present

## 2018-05-26 DIAGNOSIS — Z23 Encounter for immunization: Secondary | ICD-10-CM | POA: Diagnosis not present

## 2018-05-26 DIAGNOSIS — N2581 Secondary hyperparathyroidism of renal origin: Secondary | ICD-10-CM | POA: Diagnosis not present

## 2018-05-26 DIAGNOSIS — D631 Anemia in chronic kidney disease: Secondary | ICD-10-CM | POA: Diagnosis not present

## 2018-05-27 DIAGNOSIS — I5022 Chronic systolic (congestive) heart failure: Secondary | ICD-10-CM | POA: Diagnosis not present

## 2018-05-27 DIAGNOSIS — R0789 Other chest pain: Secondary | ICD-10-CM | POA: Diagnosis not present

## 2018-05-27 DIAGNOSIS — J9811 Atelectasis: Secondary | ICD-10-CM | POA: Diagnosis not present

## 2018-05-27 DIAGNOSIS — E1122 Type 2 diabetes mellitus with diabetic chronic kidney disease: Secondary | ICD-10-CM | POA: Diagnosis not present

## 2018-05-27 DIAGNOSIS — I132 Hypertensive heart and chronic kidney disease with heart failure and with stage 5 chronic kidney disease, or end stage renal disease: Secondary | ICD-10-CM | POA: Diagnosis not present

## 2018-05-27 DIAGNOSIS — D631 Anemia in chronic kidney disease: Secondary | ICD-10-CM | POA: Diagnosis not present

## 2018-05-27 DIAGNOSIS — N186 End stage renal disease: Secondary | ICD-10-CM | POA: Diagnosis not present

## 2018-05-27 DIAGNOSIS — I272 Pulmonary hypertension, unspecified: Secondary | ICD-10-CM | POA: Diagnosis not present

## 2018-05-27 DIAGNOSIS — R079 Chest pain, unspecified: Secondary | ICD-10-CM | POA: Diagnosis not present

## 2018-05-27 DIAGNOSIS — J90 Pleural effusion, not elsewhere classified: Secondary | ICD-10-CM | POA: Diagnosis not present

## 2018-05-27 DIAGNOSIS — Z9989 Dependence on other enabling machines and devices: Secondary | ICD-10-CM | POA: Diagnosis not present

## 2018-05-28 DIAGNOSIS — I120 Hypertensive chronic kidney disease with stage 5 chronic kidney disease or end stage renal disease: Secondary | ICD-10-CM | POA: Diagnosis not present

## 2018-05-28 DIAGNOSIS — I2510 Atherosclerotic heart disease of native coronary artery without angina pectoris: Secondary | ICD-10-CM | POA: Diagnosis not present

## 2018-05-28 DIAGNOSIS — N186 End stage renal disease: Secondary | ICD-10-CM | POA: Diagnosis not present

## 2018-05-28 DIAGNOSIS — I5022 Chronic systolic (congestive) heart failure: Secondary | ICD-10-CM | POA: Diagnosis not present

## 2018-05-28 DIAGNOSIS — I340 Nonrheumatic mitral (valve) insufficiency: Secondary | ICD-10-CM | POA: Diagnosis not present

## 2018-05-28 DIAGNOSIS — I255 Ischemic cardiomyopathy: Secondary | ICD-10-CM | POA: Diagnosis not present

## 2018-05-28 DIAGNOSIS — R0789 Other chest pain: Secondary | ICD-10-CM | POA: Diagnosis not present

## 2018-05-28 DIAGNOSIS — I214 Non-ST elevation (NSTEMI) myocardial infarction: Secondary | ICD-10-CM | POA: Diagnosis not present

## 2018-05-29 DIAGNOSIS — I2510 Atherosclerotic heart disease of native coronary artery without angina pectoris: Secondary | ICD-10-CM | POA: Diagnosis not present

## 2018-05-29 DIAGNOSIS — I5022 Chronic systolic (congestive) heart failure: Secondary | ICD-10-CM | POA: Diagnosis not present

## 2018-05-29 DIAGNOSIS — I10 Essential (primary) hypertension: Secondary | ICD-10-CM | POA: Diagnosis not present

## 2018-05-29 DIAGNOSIS — I340 Nonrheumatic mitral (valve) insufficiency: Secondary | ICD-10-CM | POA: Diagnosis not present

## 2018-05-29 DIAGNOSIS — I255 Ischemic cardiomyopathy: Secondary | ICD-10-CM | POA: Diagnosis not present

## 2018-05-29 DIAGNOSIS — N186 End stage renal disease: Secondary | ICD-10-CM | POA: Diagnosis not present

## 2018-05-29 DIAGNOSIS — I120 Hypertensive chronic kidney disease with stage 5 chronic kidney disease or end stage renal disease: Secondary | ICD-10-CM | POA: Diagnosis not present

## 2018-05-29 DIAGNOSIS — I214 Non-ST elevation (NSTEMI) myocardial infarction: Secondary | ICD-10-CM | POA: Diagnosis not present

## 2018-05-30 DIAGNOSIS — I272 Pulmonary hypertension, unspecified: Secondary | ICD-10-CM | POA: Diagnosis not present

## 2018-05-30 DIAGNOSIS — I5022 Chronic systolic (congestive) heart failure: Secondary | ICD-10-CM | POA: Diagnosis not present

## 2018-05-30 DIAGNOSIS — E1122 Type 2 diabetes mellitus with diabetic chronic kidney disease: Secondary | ICD-10-CM | POA: Diagnosis not present

## 2018-05-30 DIAGNOSIS — I132 Hypertensive heart and chronic kidney disease with heart failure and with stage 5 chronic kidney disease, or end stage renal disease: Secondary | ICD-10-CM | POA: Diagnosis not present

## 2018-05-30 DIAGNOSIS — N2581 Secondary hyperparathyroidism of renal origin: Secondary | ICD-10-CM | POA: Diagnosis not present

## 2018-05-30 DIAGNOSIS — Z23 Encounter for immunization: Secondary | ICD-10-CM | POA: Diagnosis not present

## 2018-05-30 DIAGNOSIS — D631 Anemia in chronic kidney disease: Secondary | ICD-10-CM | POA: Diagnosis not present

## 2018-05-30 DIAGNOSIS — N186 End stage renal disease: Secondary | ICD-10-CM | POA: Diagnosis not present

## 2018-06-02 DIAGNOSIS — D631 Anemia in chronic kidney disease: Secondary | ICD-10-CM | POA: Diagnosis not present

## 2018-06-02 DIAGNOSIS — Z23 Encounter for immunization: Secondary | ICD-10-CM | POA: Diagnosis not present

## 2018-06-02 DIAGNOSIS — N2581 Secondary hyperparathyroidism of renal origin: Secondary | ICD-10-CM | POA: Diagnosis not present

## 2018-06-02 DIAGNOSIS — N186 End stage renal disease: Secondary | ICD-10-CM | POA: Diagnosis not present

## 2018-06-03 DIAGNOSIS — Z0000 Encounter for general adult medical examination without abnormal findings: Secondary | ICD-10-CM | POA: Diagnosis not present

## 2018-06-03 DIAGNOSIS — I10 Essential (primary) hypertension: Secondary | ICD-10-CM | POA: Diagnosis not present

## 2018-06-03 DIAGNOSIS — I509 Heart failure, unspecified: Secondary | ICD-10-CM | POA: Diagnosis not present

## 2018-06-03 DIAGNOSIS — I2583 Coronary atherosclerosis due to lipid rich plaque: Secondary | ICD-10-CM | POA: Diagnosis not present

## 2018-06-03 DIAGNOSIS — E1122 Type 2 diabetes mellitus with diabetic chronic kidney disease: Secondary | ICD-10-CM | POA: Diagnosis not present

## 2018-06-03 DIAGNOSIS — R079 Chest pain, unspecified: Secondary | ICD-10-CM | POA: Diagnosis not present

## 2018-06-03 DIAGNOSIS — I132 Hypertensive heart and chronic kidney disease with heart failure and with stage 5 chronic kidney disease, or end stage renal disease: Secondary | ICD-10-CM | POA: Diagnosis not present

## 2018-06-03 DIAGNOSIS — I272 Pulmonary hypertension, unspecified: Secondary | ICD-10-CM | POA: Diagnosis not present

## 2018-06-03 DIAGNOSIS — D631 Anemia in chronic kidney disease: Secondary | ICD-10-CM | POA: Diagnosis not present

## 2018-06-03 DIAGNOSIS — N186 End stage renal disease: Secondary | ICD-10-CM | POA: Diagnosis not present

## 2018-06-03 DIAGNOSIS — E0840 Diabetes mellitus due to underlying condition with diabetic neuropathy, unspecified: Secondary | ICD-10-CM | POA: Diagnosis not present

## 2018-06-03 DIAGNOSIS — I5022 Chronic systolic (congestive) heart failure: Secondary | ICD-10-CM | POA: Diagnosis not present

## 2018-06-04 DIAGNOSIS — D631 Anemia in chronic kidney disease: Secondary | ICD-10-CM | POA: Diagnosis not present

## 2018-06-04 DIAGNOSIS — N186 End stage renal disease: Secondary | ICD-10-CM | POA: Diagnosis not present

## 2018-06-04 DIAGNOSIS — N2581 Secondary hyperparathyroidism of renal origin: Secondary | ICD-10-CM | POA: Diagnosis not present

## 2018-06-05 DIAGNOSIS — I739 Peripheral vascular disease, unspecified: Secondary | ICD-10-CM | POA: Diagnosis not present

## 2018-06-05 DIAGNOSIS — R918 Other nonspecific abnormal finding of lung field: Secondary | ICD-10-CM | POA: Diagnosis not present

## 2018-06-05 DIAGNOSIS — E1142 Type 2 diabetes mellitus with diabetic polyneuropathy: Secondary | ICD-10-CM | POA: Diagnosis not present

## 2018-06-05 DIAGNOSIS — M79602 Pain in left arm: Secondary | ICD-10-CM | POA: Diagnosis not present

## 2018-06-05 DIAGNOSIS — R079 Chest pain, unspecified: Secondary | ICD-10-CM | POA: Diagnosis not present

## 2018-06-05 DIAGNOSIS — I081 Rheumatic disorders of both mitral and tricuspid valves: Secondary | ICD-10-CM | POA: Diagnosis not present

## 2018-06-05 DIAGNOSIS — Z8249 Family history of ischemic heart disease and other diseases of the circulatory system: Secondary | ICD-10-CM | POA: Diagnosis not present

## 2018-06-05 DIAGNOSIS — Z833 Family history of diabetes mellitus: Secondary | ICD-10-CM | POA: Diagnosis not present

## 2018-06-05 DIAGNOSIS — E1122 Type 2 diabetes mellitus with diabetic chronic kidney disease: Secondary | ICD-10-CM | POA: Diagnosis not present

## 2018-06-05 DIAGNOSIS — I5022 Chronic systolic (congestive) heart failure: Secondary | ICD-10-CM | POA: Diagnosis not present

## 2018-06-05 DIAGNOSIS — M79609 Pain in unspecified limb: Secondary | ICD-10-CM | POA: Diagnosis not present

## 2018-06-05 DIAGNOSIS — Z951 Presence of aortocoronary bypass graft: Secondary | ICD-10-CM | POA: Diagnosis not present

## 2018-06-05 DIAGNOSIS — D631 Anemia in chronic kidney disease: Secondary | ICD-10-CM | POA: Diagnosis not present

## 2018-06-05 DIAGNOSIS — N186 End stage renal disease: Secondary | ICD-10-CM | POA: Diagnosis not present

## 2018-06-05 DIAGNOSIS — R0789 Other chest pain: Secondary | ICD-10-CM | POA: Diagnosis not present

## 2018-06-05 DIAGNOSIS — I2722 Pulmonary hypertension due to left heart disease: Secondary | ICD-10-CM | POA: Diagnosis not present

## 2018-06-05 DIAGNOSIS — I132 Hypertensive heart and chronic kidney disease with heart failure and with stage 5 chronic kidney disease, or end stage renal disease: Secondary | ICD-10-CM | POA: Diagnosis not present

## 2018-06-05 DIAGNOSIS — Z79899 Other long term (current) drug therapy: Secondary | ICD-10-CM | POA: Diagnosis not present

## 2018-06-05 DIAGNOSIS — E782 Mixed hyperlipidemia: Secondary | ICD-10-CM | POA: Diagnosis not present

## 2018-06-05 DIAGNOSIS — Z89412 Acquired absence of left great toe: Secondary | ICD-10-CM | POA: Diagnosis not present

## 2018-06-05 DIAGNOSIS — J90 Pleural effusion, not elsewhere classified: Secondary | ICD-10-CM | POA: Diagnosis not present

## 2018-06-05 DIAGNOSIS — Z7982 Long term (current) use of aspirin: Secondary | ICD-10-CM | POA: Diagnosis not present

## 2018-06-05 DIAGNOSIS — D638 Anemia in other chronic diseases classified elsewhere: Secondary | ICD-10-CM | POA: Diagnosis not present

## 2018-06-05 DIAGNOSIS — I2510 Atherosclerotic heart disease of native coronary artery without angina pectoris: Secondary | ICD-10-CM | POA: Diagnosis not present

## 2018-06-05 DIAGNOSIS — Z992 Dependence on renal dialysis: Secondary | ICD-10-CM | POA: Diagnosis not present

## 2018-06-05 DIAGNOSIS — I160 Hypertensive urgency: Secondary | ICD-10-CM | POA: Diagnosis not present

## 2018-06-05 DIAGNOSIS — I272 Pulmonary hypertension, unspecified: Secondary | ICD-10-CM | POA: Diagnosis not present

## 2018-06-05 DIAGNOSIS — Z87891 Personal history of nicotine dependence: Secondary | ICD-10-CM | POA: Diagnosis not present

## 2018-06-05 DIAGNOSIS — Z7902 Long term (current) use of antithrombotics/antiplatelets: Secondary | ICD-10-CM | POA: Diagnosis not present

## 2018-06-05 DIAGNOSIS — I255 Ischemic cardiomyopathy: Secondary | ICD-10-CM | POA: Diagnosis not present

## 2018-06-06 DIAGNOSIS — I10 Essential (primary) hypertension: Secondary | ICD-10-CM | POA: Diagnosis not present

## 2018-06-06 DIAGNOSIS — N186 End stage renal disease: Secondary | ICD-10-CM | POA: Diagnosis not present

## 2018-06-06 DIAGNOSIS — R079 Chest pain, unspecified: Secondary | ICD-10-CM | POA: Diagnosis not present

## 2018-06-06 DIAGNOSIS — E119 Type 2 diabetes mellitus without complications: Secondary | ICD-10-CM | POA: Diagnosis not present

## 2018-06-07 DIAGNOSIS — N186 End stage renal disease: Secondary | ICD-10-CM | POA: Diagnosis not present

## 2018-06-07 DIAGNOSIS — D631 Anemia in chronic kidney disease: Secondary | ICD-10-CM | POA: Diagnosis not present

## 2018-06-07 DIAGNOSIS — N2581 Secondary hyperparathyroidism of renal origin: Secondary | ICD-10-CM | POA: Diagnosis not present

## 2018-06-08 DIAGNOSIS — N186 End stage renal disease: Secondary | ICD-10-CM | POA: Diagnosis not present

## 2018-06-08 DIAGNOSIS — Z8249 Family history of ischemic heart disease and other diseases of the circulatory system: Secondary | ICD-10-CM | POA: Diagnosis not present

## 2018-06-08 DIAGNOSIS — Z86718 Personal history of other venous thrombosis and embolism: Secondary | ICD-10-CM | POA: Diagnosis not present

## 2018-06-08 DIAGNOSIS — I5022 Chronic systolic (congestive) heart failure: Secondary | ICD-10-CM | POA: Diagnosis not present

## 2018-06-08 DIAGNOSIS — Z794 Long term (current) use of insulin: Secondary | ICD-10-CM | POA: Diagnosis not present

## 2018-06-08 DIAGNOSIS — I132 Hypertensive heart and chronic kidney disease with heart failure and with stage 5 chronic kidney disease, or end stage renal disease: Secondary | ICD-10-CM | POA: Diagnosis not present

## 2018-06-08 DIAGNOSIS — R079 Chest pain, unspecified: Secondary | ICD-10-CM | POA: Diagnosis not present

## 2018-06-08 DIAGNOSIS — R918 Other nonspecific abnormal finding of lung field: Secondary | ICD-10-CM | POA: Diagnosis not present

## 2018-06-08 DIAGNOSIS — I2510 Atherosclerotic heart disease of native coronary artery without angina pectoris: Secondary | ICD-10-CM | POA: Diagnosis not present

## 2018-06-08 DIAGNOSIS — I255 Ischemic cardiomyopathy: Secondary | ICD-10-CM | POA: Diagnosis not present

## 2018-06-08 DIAGNOSIS — Z833 Family history of diabetes mellitus: Secondary | ICD-10-CM | POA: Diagnosis not present

## 2018-06-08 DIAGNOSIS — Z87891 Personal history of nicotine dependence: Secondary | ICD-10-CM | POA: Diagnosis not present

## 2018-06-08 DIAGNOSIS — Z79899 Other long term (current) drug therapy: Secondary | ICD-10-CM | POA: Diagnosis not present

## 2018-06-08 DIAGNOSIS — I252 Old myocardial infarction: Secondary | ICD-10-CM | POA: Diagnosis not present

## 2018-06-08 DIAGNOSIS — I2722 Pulmonary hypertension due to left heart disease: Secondary | ICD-10-CM | POA: Diagnosis not present

## 2018-06-08 DIAGNOSIS — Z992 Dependence on renal dialysis: Secondary | ICD-10-CM | POA: Diagnosis not present

## 2018-06-08 DIAGNOSIS — E1142 Type 2 diabetes mellitus with diabetic polyneuropathy: Secondary | ICD-10-CM | POA: Diagnosis not present

## 2018-06-08 DIAGNOSIS — I081 Rheumatic disorders of both mitral and tricuspid valves: Secondary | ICD-10-CM | POA: Diagnosis not present

## 2018-06-08 DIAGNOSIS — R0602 Shortness of breath: Secondary | ICD-10-CM | POA: Diagnosis not present

## 2018-06-08 DIAGNOSIS — J90 Pleural effusion, not elsewhere classified: Secondary | ICD-10-CM | POA: Diagnosis not present

## 2018-06-08 DIAGNOSIS — R0789 Other chest pain: Secondary | ICD-10-CM | POA: Diagnosis not present

## 2018-06-08 DIAGNOSIS — D631 Anemia in chronic kidney disease: Secondary | ICD-10-CM | POA: Diagnosis not present

## 2018-06-08 DIAGNOSIS — Z89412 Acquired absence of left great toe: Secondary | ICD-10-CM | POA: Diagnosis not present

## 2018-06-08 DIAGNOSIS — Z951 Presence of aortocoronary bypass graft: Secondary | ICD-10-CM | POA: Diagnosis not present

## 2018-06-08 DIAGNOSIS — E785 Hyperlipidemia, unspecified: Secondary | ICD-10-CM | POA: Diagnosis not present

## 2018-06-08 DIAGNOSIS — I160 Hypertensive urgency: Secondary | ICD-10-CM | POA: Diagnosis not present

## 2018-06-08 DIAGNOSIS — Z7982 Long term (current) use of aspirin: Secondary | ICD-10-CM | POA: Diagnosis not present

## 2018-06-08 DIAGNOSIS — E1122 Type 2 diabetes mellitus with diabetic chronic kidney disease: Secondary | ICD-10-CM | POA: Diagnosis not present

## 2018-06-09 DIAGNOSIS — I255 Ischemic cardiomyopathy: Secondary | ICD-10-CM | POA: Diagnosis not present

## 2018-06-09 DIAGNOSIS — R079 Chest pain, unspecified: Secondary | ICD-10-CM | POA: Diagnosis not present

## 2018-06-09 DIAGNOSIS — I120 Hypertensive chronic kidney disease with stage 5 chronic kidney disease or end stage renal disease: Secondary | ICD-10-CM | POA: Diagnosis not present

## 2018-06-09 DIAGNOSIS — N186 End stage renal disease: Secondary | ICD-10-CM | POA: Diagnosis not present

## 2018-06-09 DIAGNOSIS — I2510 Atherosclerotic heart disease of native coronary artery without angina pectoris: Secondary | ICD-10-CM | POA: Diagnosis not present

## 2018-06-09 DIAGNOSIS — I5022 Chronic systolic (congestive) heart failure: Secondary | ICD-10-CM | POA: Diagnosis not present

## 2018-06-09 DIAGNOSIS — I10 Essential (primary) hypertension: Secondary | ICD-10-CM | POA: Diagnosis not present

## 2018-06-10 DIAGNOSIS — D631 Anemia in chronic kidney disease: Secondary | ICD-10-CM | POA: Diagnosis not present

## 2018-06-10 DIAGNOSIS — I132 Hypertensive heart and chronic kidney disease with heart failure and with stage 5 chronic kidney disease, or end stage renal disease: Secondary | ICD-10-CM | POA: Diagnosis not present

## 2018-06-10 DIAGNOSIS — I272 Pulmonary hypertension, unspecified: Secondary | ICD-10-CM | POA: Diagnosis not present

## 2018-06-10 DIAGNOSIS — E1122 Type 2 diabetes mellitus with diabetic chronic kidney disease: Secondary | ICD-10-CM | POA: Diagnosis not present

## 2018-06-10 DIAGNOSIS — N186 End stage renal disease: Secondary | ICD-10-CM | POA: Diagnosis not present

## 2018-06-10 DIAGNOSIS — I5022 Chronic systolic (congestive) heart failure: Secondary | ICD-10-CM | POA: Diagnosis not present

## 2018-06-11 DIAGNOSIS — N186 End stage renal disease: Secondary | ICD-10-CM | POA: Diagnosis not present

## 2018-06-11 DIAGNOSIS — N2581 Secondary hyperparathyroidism of renal origin: Secondary | ICD-10-CM | POA: Diagnosis not present

## 2018-06-11 DIAGNOSIS — D631 Anemia in chronic kidney disease: Secondary | ICD-10-CM | POA: Diagnosis not present

## 2018-06-12 DIAGNOSIS — I10 Essential (primary) hypertension: Secondary | ICD-10-CM | POA: Diagnosis not present

## 2018-06-12 DIAGNOSIS — I255 Ischemic cardiomyopathy: Secondary | ICD-10-CM | POA: Diagnosis not present

## 2018-06-12 DIAGNOSIS — I340 Nonrheumatic mitral (valve) insufficiency: Secondary | ICD-10-CM | POA: Diagnosis not present

## 2018-06-12 DIAGNOSIS — I2510 Atherosclerotic heart disease of native coronary artery without angina pectoris: Secondary | ICD-10-CM | POA: Diagnosis not present

## 2018-06-13 DIAGNOSIS — E871 Hypo-osmolality and hyponatremia: Secondary | ICD-10-CM | POA: Diagnosis not present

## 2018-06-13 DIAGNOSIS — R079 Chest pain, unspecified: Secondary | ICD-10-CM | POA: Diagnosis not present

## 2018-06-13 DIAGNOSIS — R0789 Other chest pain: Secondary | ICD-10-CM | POA: Diagnosis not present

## 2018-06-13 DIAGNOSIS — D631 Anemia in chronic kidney disease: Secondary | ICD-10-CM | POA: Diagnosis not present

## 2018-06-13 DIAGNOSIS — I5043 Acute on chronic combined systolic (congestive) and diastolic (congestive) heart failure: Secondary | ICD-10-CM | POA: Diagnosis not present

## 2018-06-13 DIAGNOSIS — J9811 Atelectasis: Secondary | ICD-10-CM | POA: Diagnosis not present

## 2018-06-13 DIAGNOSIS — I272 Pulmonary hypertension, unspecified: Secondary | ICD-10-CM | POA: Diagnosis not present

## 2018-06-13 DIAGNOSIS — R200 Anesthesia of skin: Secondary | ICD-10-CM | POA: Diagnosis not present

## 2018-06-13 DIAGNOSIS — I2119 ST elevation (STEMI) myocardial infarction involving other coronary artery of inferior wall: Secondary | ICD-10-CM | POA: Diagnosis not present

## 2018-06-13 DIAGNOSIS — I5022 Chronic systolic (congestive) heart failure: Secondary | ICD-10-CM | POA: Diagnosis not present

## 2018-06-13 DIAGNOSIS — R0989 Other specified symptoms and signs involving the circulatory and respiratory systems: Secondary | ICD-10-CM | POA: Diagnosis not present

## 2018-06-13 DIAGNOSIS — I132 Hypertensive heart and chronic kidney disease with heart failure and with stage 5 chronic kidney disease, or end stage renal disease: Secondary | ICD-10-CM | POA: Diagnosis not present

## 2018-06-13 DIAGNOSIS — E1122 Type 2 diabetes mellitus with diabetic chronic kidney disease: Secondary | ICD-10-CM | POA: Diagnosis not present

## 2018-06-13 DIAGNOSIS — N2581 Secondary hyperparathyroidism of renal origin: Secondary | ICD-10-CM | POA: Diagnosis not present

## 2018-06-13 DIAGNOSIS — I509 Heart failure, unspecified: Secondary | ICD-10-CM | POA: Diagnosis not present

## 2018-06-13 DIAGNOSIS — J9601 Acute respiratory failure with hypoxia: Secondary | ICD-10-CM | POA: Diagnosis not present

## 2018-06-13 DIAGNOSIS — N186 End stage renal disease: Secondary | ICD-10-CM | POA: Diagnosis not present

## 2018-06-14 DIAGNOSIS — J90 Pleural effusion, not elsewhere classified: Secondary | ICD-10-CM | POA: Diagnosis not present

## 2018-06-14 DIAGNOSIS — Z86718 Personal history of other venous thrombosis and embolism: Secondary | ICD-10-CM | POA: Diagnosis not present

## 2018-06-14 DIAGNOSIS — I5020 Unspecified systolic (congestive) heart failure: Secondary | ICD-10-CM | POA: Diagnosis not present

## 2018-06-14 DIAGNOSIS — R110 Nausea: Secondary | ICD-10-CM | POA: Diagnosis not present

## 2018-06-14 DIAGNOSIS — Z992 Dependence on renal dialysis: Secondary | ICD-10-CM | POA: Diagnosis not present

## 2018-06-14 DIAGNOSIS — R091 Pleurisy: Secondary | ICD-10-CM | POA: Diagnosis not present

## 2018-06-14 DIAGNOSIS — I5022 Chronic systolic (congestive) heart failure: Secondary | ICD-10-CM | POA: Diagnosis not present

## 2018-06-14 DIAGNOSIS — I5021 Acute systolic (congestive) heart failure: Secondary | ICD-10-CM | POA: Diagnosis not present

## 2018-06-14 DIAGNOSIS — E1151 Type 2 diabetes mellitus with diabetic peripheral angiopathy without gangrene: Secondary | ICD-10-CM | POA: Diagnosis present

## 2018-06-14 DIAGNOSIS — I25119 Atherosclerotic heart disease of native coronary artery with unspecified angina pectoris: Secondary | ICD-10-CM | POA: Diagnosis present

## 2018-06-14 DIAGNOSIS — I5043 Acute on chronic combined systolic (congestive) and diastolic (congestive) heart failure: Secondary | ICD-10-CM | POA: Diagnosis present

## 2018-06-14 DIAGNOSIS — E785 Hyperlipidemia, unspecified: Secondary | ICD-10-CM | POA: Diagnosis present

## 2018-06-14 DIAGNOSIS — I120 Hypertensive chronic kidney disease with stage 5 chronic kidney disease or end stage renal disease: Secondary | ICD-10-CM | POA: Diagnosis not present

## 2018-06-14 DIAGNOSIS — D631 Anemia in chronic kidney disease: Secondary | ICD-10-CM | POA: Diagnosis present

## 2018-06-14 DIAGNOSIS — M940 Chondrocostal junction syndrome [Tietze]: Secondary | ICD-10-CM | POA: Diagnosis present

## 2018-06-14 DIAGNOSIS — R197 Diarrhea, unspecified: Secondary | ICD-10-CM | POA: Diagnosis not present

## 2018-06-14 DIAGNOSIS — E1122 Type 2 diabetes mellitus with diabetic chronic kidney disease: Secondary | ICD-10-CM | POA: Diagnosis present

## 2018-06-14 DIAGNOSIS — Z89412 Acquired absence of left great toe: Secondary | ICD-10-CM | POA: Diagnosis not present

## 2018-06-14 DIAGNOSIS — I340 Nonrheumatic mitral (valve) insufficiency: Secondary | ICD-10-CM | POA: Diagnosis not present

## 2018-06-14 DIAGNOSIS — I361 Nonrheumatic tricuspid (valve) insufficiency: Secondary | ICD-10-CM | POA: Diagnosis not present

## 2018-06-14 DIAGNOSIS — I517 Cardiomegaly: Secondary | ICD-10-CM | POA: Diagnosis not present

## 2018-06-14 DIAGNOSIS — I255 Ischemic cardiomyopathy: Secondary | ICD-10-CM | POA: Diagnosis not present

## 2018-06-14 DIAGNOSIS — I132 Hypertensive heart and chronic kidney disease with heart failure and with stage 5 chronic kidney disease, or end stage renal disease: Secondary | ICD-10-CM | POA: Diagnosis not present

## 2018-06-14 DIAGNOSIS — J811 Chronic pulmonary edema: Secondary | ICD-10-CM | POA: Diagnosis not present

## 2018-06-14 DIAGNOSIS — E871 Hypo-osmolality and hyponatremia: Secondary | ICD-10-CM | POA: Diagnosis not present

## 2018-06-14 DIAGNOSIS — R0989 Other specified symptoms and signs involving the circulatory and respiratory systems: Secondary | ICD-10-CM | POA: Diagnosis not present

## 2018-06-14 DIAGNOSIS — I2510 Atherosclerotic heart disease of native coronary artery without angina pectoris: Secondary | ICD-10-CM | POA: Diagnosis not present

## 2018-06-14 DIAGNOSIS — I081 Rheumatic disorders of both mitral and tricuspid valves: Secondary | ICD-10-CM | POA: Diagnosis present

## 2018-06-14 DIAGNOSIS — I252 Old myocardial infarction: Secondary | ICD-10-CM | POA: Diagnosis not present

## 2018-06-14 DIAGNOSIS — I2722 Pulmonary hypertension due to left heart disease: Secondary | ICD-10-CM | POA: Diagnosis present

## 2018-06-14 DIAGNOSIS — J9811 Atelectasis: Secondary | ICD-10-CM | POA: Diagnosis not present

## 2018-06-14 DIAGNOSIS — R079 Chest pain, unspecified: Secondary | ICD-10-CM | POA: Diagnosis not present

## 2018-06-14 DIAGNOSIS — R0789 Other chest pain: Secondary | ICD-10-CM | POA: Diagnosis not present

## 2018-06-14 DIAGNOSIS — R0603 Acute respiratory distress: Secondary | ICD-10-CM | POA: Diagnosis not present

## 2018-06-14 DIAGNOSIS — N186 End stage renal disease: Secondary | ICD-10-CM | POA: Diagnosis not present

## 2018-06-14 DIAGNOSIS — Q211 Atrial septal defect: Secondary | ICD-10-CM | POA: Diagnosis not present

## 2018-06-14 DIAGNOSIS — J9601 Acute respiratory failure with hypoxia: Secondary | ICD-10-CM | POA: Diagnosis not present

## 2018-06-14 DIAGNOSIS — I272 Pulmonary hypertension, unspecified: Secondary | ICD-10-CM | POA: Diagnosis not present

## 2018-06-14 DIAGNOSIS — K5900 Constipation, unspecified: Secondary | ICD-10-CM | POA: Diagnosis not present

## 2018-06-14 DIAGNOSIS — I872 Venous insufficiency (chronic) (peripheral): Secondary | ICD-10-CM | POA: Diagnosis not present

## 2018-06-14 DIAGNOSIS — T82858A Stenosis of vascular prosthetic devices, implants and grafts, initial encounter: Secondary | ICD-10-CM | POA: Diagnosis not present

## 2018-06-16 DIAGNOSIS — N186 End stage renal disease: Secondary | ICD-10-CM | POA: Diagnosis not present

## 2018-06-16 DIAGNOSIS — I120 Hypertensive chronic kidney disease with stage 5 chronic kidney disease or end stage renal disease: Secondary | ICD-10-CM | POA: Diagnosis not present

## 2018-06-22 DIAGNOSIS — N186 End stage renal disease: Secondary | ICD-10-CM | POA: Diagnosis not present

## 2018-06-22 DIAGNOSIS — I5022 Chronic systolic (congestive) heart failure: Secondary | ICD-10-CM | POA: Diagnosis not present

## 2018-06-22 DIAGNOSIS — E1122 Type 2 diabetes mellitus with diabetic chronic kidney disease: Secondary | ICD-10-CM | POA: Diagnosis not present

## 2018-06-22 DIAGNOSIS — I132 Hypertensive heart and chronic kidney disease with heart failure and with stage 5 chronic kidney disease, or end stage renal disease: Secondary | ICD-10-CM | POA: Diagnosis not present

## 2018-06-22 DIAGNOSIS — I272 Pulmonary hypertension, unspecified: Secondary | ICD-10-CM | POA: Diagnosis not present

## 2018-06-22 DIAGNOSIS — D631 Anemia in chronic kidney disease: Secondary | ICD-10-CM | POA: Diagnosis not present

## 2018-06-23 DIAGNOSIS — N2581 Secondary hyperparathyroidism of renal origin: Secondary | ICD-10-CM | POA: Diagnosis not present

## 2018-06-23 DIAGNOSIS — I272 Pulmonary hypertension, unspecified: Secondary | ICD-10-CM | POA: Diagnosis not present

## 2018-06-23 DIAGNOSIS — I5022 Chronic systolic (congestive) heart failure: Secondary | ICD-10-CM | POA: Diagnosis not present

## 2018-06-23 DIAGNOSIS — D631 Anemia in chronic kidney disease: Secondary | ICD-10-CM | POA: Diagnosis not present

## 2018-06-23 DIAGNOSIS — N186 End stage renal disease: Secondary | ICD-10-CM | POA: Diagnosis not present

## 2018-06-23 DIAGNOSIS — E1122 Type 2 diabetes mellitus with diabetic chronic kidney disease: Secondary | ICD-10-CM | POA: Diagnosis not present

## 2018-06-23 DIAGNOSIS — I132 Hypertensive heart and chronic kidney disease with heart failure and with stage 5 chronic kidney disease, or end stage renal disease: Secondary | ICD-10-CM | POA: Diagnosis not present

## 2018-06-24 DIAGNOSIS — I272 Pulmonary hypertension, unspecified: Secondary | ICD-10-CM | POA: Diagnosis not present

## 2018-06-24 DIAGNOSIS — D631 Anemia in chronic kidney disease: Secondary | ICD-10-CM | POA: Diagnosis not present

## 2018-06-24 DIAGNOSIS — I5022 Chronic systolic (congestive) heart failure: Secondary | ICD-10-CM | POA: Diagnosis not present

## 2018-06-24 DIAGNOSIS — I132 Hypertensive heart and chronic kidney disease with heart failure and with stage 5 chronic kidney disease, or end stage renal disease: Secondary | ICD-10-CM | POA: Diagnosis not present

## 2018-06-24 DIAGNOSIS — N186 End stage renal disease: Secondary | ICD-10-CM | POA: Diagnosis not present

## 2018-06-24 DIAGNOSIS — E1122 Type 2 diabetes mellitus with diabetic chronic kidney disease: Secondary | ICD-10-CM | POA: Diagnosis not present

## 2018-06-24 DIAGNOSIS — R600 Localized edema: Secondary | ICD-10-CM | POA: Diagnosis not present

## 2018-06-25 DIAGNOSIS — I509 Heart failure, unspecified: Secondary | ICD-10-CM | POA: Diagnosis not present

## 2018-06-25 DIAGNOSIS — I132 Hypertensive heart and chronic kidney disease with heart failure and with stage 5 chronic kidney disease, or end stage renal disease: Secondary | ICD-10-CM | POA: Diagnosis not present

## 2018-06-25 DIAGNOSIS — I5023 Acute on chronic systolic (congestive) heart failure: Secondary | ICD-10-CM | POA: Diagnosis not present

## 2018-06-25 DIAGNOSIS — N186 End stage renal disease: Secondary | ICD-10-CM | POA: Diagnosis not present

## 2018-06-25 DIAGNOSIS — J811 Chronic pulmonary edema: Secondary | ICD-10-CM | POA: Diagnosis not present

## 2018-06-25 DIAGNOSIS — N2581 Secondary hyperparathyroidism of renal origin: Secondary | ICD-10-CM | POA: Diagnosis not present

## 2018-06-25 DIAGNOSIS — R0789 Other chest pain: Secondary | ICD-10-CM | POA: Diagnosis not present

## 2018-06-25 DIAGNOSIS — Z743 Need for continuous supervision: Secondary | ICD-10-CM | POA: Diagnosis not present

## 2018-06-25 DIAGNOSIS — Z992 Dependence on renal dialysis: Secondary | ICD-10-CM | POA: Diagnosis not present

## 2018-06-25 DIAGNOSIS — J90 Pleural effusion, not elsewhere classified: Secondary | ICD-10-CM | POA: Diagnosis not present

## 2018-06-25 DIAGNOSIS — I10 Essential (primary) hypertension: Secondary | ICD-10-CM | POA: Diagnosis not present

## 2018-06-25 DIAGNOSIS — I420 Dilated cardiomyopathy: Secondary | ICD-10-CM | POA: Diagnosis not present

## 2018-06-25 DIAGNOSIS — R079 Chest pain, unspecified: Secondary | ICD-10-CM | POA: Diagnosis not present

## 2018-06-25 DIAGNOSIS — D631 Anemia in chronic kidney disease: Secondary | ICD-10-CM | POA: Diagnosis not present

## 2018-06-26 DIAGNOSIS — I5020 Unspecified systolic (congestive) heart failure: Secondary | ICD-10-CM | POA: Diagnosis not present

## 2018-06-26 DIAGNOSIS — I10 Essential (primary) hypertension: Secondary | ICD-10-CM | POA: Diagnosis not present

## 2018-06-26 DIAGNOSIS — D631 Anemia in chronic kidney disease: Secondary | ICD-10-CM | POA: Diagnosis present

## 2018-06-26 DIAGNOSIS — N2581 Secondary hyperparathyroidism of renal origin: Secondary | ICD-10-CM | POA: Diagnosis present

## 2018-06-26 DIAGNOSIS — I2510 Atherosclerotic heart disease of native coronary artery without angina pectoris: Secondary | ICD-10-CM | POA: Diagnosis not present

## 2018-06-26 DIAGNOSIS — I5023 Acute on chronic systolic (congestive) heart failure: Secondary | ICD-10-CM | POA: Diagnosis present

## 2018-06-26 DIAGNOSIS — I252 Old myocardial infarction: Secondary | ICD-10-CM | POA: Diagnosis not present

## 2018-06-26 DIAGNOSIS — I132 Hypertensive heart and chronic kidney disease with heart failure and with stage 5 chronic kidney disease, or end stage renal disease: Secondary | ICD-10-CM | POA: Diagnosis present

## 2018-06-26 DIAGNOSIS — Z951 Presence of aortocoronary bypass graft: Secondary | ICD-10-CM | POA: Diagnosis not present

## 2018-06-26 DIAGNOSIS — I340 Nonrheumatic mitral (valve) insufficiency: Secondary | ICD-10-CM | POA: Diagnosis present

## 2018-06-26 DIAGNOSIS — N186 End stage renal disease: Secondary | ICD-10-CM | POA: Diagnosis not present

## 2018-06-26 DIAGNOSIS — I420 Dilated cardiomyopathy: Secondary | ICD-10-CM | POA: Diagnosis present

## 2018-06-26 DIAGNOSIS — E1122 Type 2 diabetes mellitus with diabetic chronic kidney disease: Secondary | ICD-10-CM | POA: Diagnosis present

## 2018-06-26 DIAGNOSIS — R0789 Other chest pain: Secondary | ICD-10-CM | POA: Diagnosis not present

## 2018-06-26 DIAGNOSIS — I120 Hypertensive chronic kidney disease with stage 5 chronic kidney disease or end stage renal disease: Secondary | ICD-10-CM | POA: Diagnosis not present

## 2018-06-26 DIAGNOSIS — J811 Chronic pulmonary edema: Secondary | ICD-10-CM | POA: Diagnosis not present

## 2018-06-26 DIAGNOSIS — R079 Chest pain, unspecified: Secondary | ICD-10-CM | POA: Diagnosis not present

## 2018-06-26 DIAGNOSIS — E785 Hyperlipidemia, unspecified: Secondary | ICD-10-CM | POA: Diagnosis not present

## 2018-06-26 DIAGNOSIS — Z992 Dependence on renal dialysis: Secondary | ICD-10-CM | POA: Diagnosis not present

## 2018-06-26 DIAGNOSIS — J90 Pleural effusion, not elsewhere classified: Secondary | ICD-10-CM | POA: Diagnosis not present

## 2018-06-26 DIAGNOSIS — Z955 Presence of coronary angioplasty implant and graft: Secondary | ICD-10-CM | POA: Diagnosis not present

## 2018-06-26 DIAGNOSIS — K219 Gastro-esophageal reflux disease without esophagitis: Secondary | ICD-10-CM | POA: Diagnosis present

## 2018-06-26 DIAGNOSIS — I361 Nonrheumatic tricuspid (valve) insufficiency: Secondary | ICD-10-CM | POA: Diagnosis not present

## 2018-06-30 DIAGNOSIS — N186 End stage renal disease: Secondary | ICD-10-CM | POA: Diagnosis not present

## 2018-06-30 DIAGNOSIS — D631 Anemia in chronic kidney disease: Secondary | ICD-10-CM | POA: Diagnosis not present

## 2018-06-30 DIAGNOSIS — N2581 Secondary hyperparathyroidism of renal origin: Secondary | ICD-10-CM | POA: Diagnosis not present

## 2018-07-01 DIAGNOSIS — I132 Hypertensive heart and chronic kidney disease with heart failure and with stage 5 chronic kidney disease, or end stage renal disease: Secondary | ICD-10-CM | POA: Diagnosis not present

## 2018-07-01 DIAGNOSIS — D631 Anemia in chronic kidney disease: Secondary | ICD-10-CM | POA: Diagnosis not present

## 2018-07-01 DIAGNOSIS — I5022 Chronic systolic (congestive) heart failure: Secondary | ICD-10-CM | POA: Diagnosis not present

## 2018-07-01 DIAGNOSIS — E1122 Type 2 diabetes mellitus with diabetic chronic kidney disease: Secondary | ICD-10-CM | POA: Diagnosis not present

## 2018-07-01 DIAGNOSIS — I272 Pulmonary hypertension, unspecified: Secondary | ICD-10-CM | POA: Diagnosis not present

## 2018-07-01 DIAGNOSIS — N186 End stage renal disease: Secondary | ICD-10-CM | POA: Diagnosis not present

## 2018-07-02 DIAGNOSIS — D631 Anemia in chronic kidney disease: Secondary | ICD-10-CM | POA: Diagnosis not present

## 2018-07-02 DIAGNOSIS — N2581 Secondary hyperparathyroidism of renal origin: Secondary | ICD-10-CM | POA: Diagnosis not present

## 2018-07-02 DIAGNOSIS — N186 End stage renal disease: Secondary | ICD-10-CM | POA: Diagnosis not present

## 2018-07-03 DIAGNOSIS — R079 Chest pain, unspecified: Secondary | ICD-10-CM | POA: Diagnosis not present

## 2018-07-03 DIAGNOSIS — I2583 Coronary atherosclerosis due to lipid rich plaque: Secondary | ICD-10-CM | POA: Diagnosis not present

## 2018-07-03 DIAGNOSIS — I509 Heart failure, unspecified: Secondary | ICD-10-CM | POA: Diagnosis not present

## 2018-07-03 DIAGNOSIS — E0840 Diabetes mellitus due to underlying condition with diabetic neuropathy, unspecified: Secondary | ICD-10-CM | POA: Diagnosis not present

## 2018-07-03 DIAGNOSIS — N186 End stage renal disease: Secondary | ICD-10-CM | POA: Diagnosis not present

## 2018-07-03 DIAGNOSIS — I10 Essential (primary) hypertension: Secondary | ICD-10-CM | POA: Diagnosis not present

## 2018-07-04 DIAGNOSIS — N2581 Secondary hyperparathyroidism of renal origin: Secondary | ICD-10-CM | POA: Diagnosis not present

## 2018-07-04 DIAGNOSIS — N186 End stage renal disease: Secondary | ICD-10-CM | POA: Diagnosis not present

## 2018-07-04 DIAGNOSIS — D631 Anemia in chronic kidney disease: Secondary | ICD-10-CM | POA: Diagnosis not present

## 2018-07-07 DIAGNOSIS — N2581 Secondary hyperparathyroidism of renal origin: Secondary | ICD-10-CM | POA: Diagnosis not present

## 2018-07-07 DIAGNOSIS — D631 Anemia in chronic kidney disease: Secondary | ICD-10-CM | POA: Diagnosis not present

## 2018-07-07 DIAGNOSIS — N186 End stage renal disease: Secondary | ICD-10-CM | POA: Diagnosis not present

## 2018-07-08 DIAGNOSIS — I132 Hypertensive heart and chronic kidney disease with heart failure and with stage 5 chronic kidney disease, or end stage renal disease: Secondary | ICD-10-CM | POA: Diagnosis not present

## 2018-07-08 DIAGNOSIS — N186 End stage renal disease: Secondary | ICD-10-CM | POA: Diagnosis not present

## 2018-07-08 DIAGNOSIS — E1122 Type 2 diabetes mellitus with diabetic chronic kidney disease: Secondary | ICD-10-CM | POA: Diagnosis not present

## 2018-07-08 DIAGNOSIS — I5022 Chronic systolic (congestive) heart failure: Secondary | ICD-10-CM | POA: Diagnosis not present

## 2018-07-08 DIAGNOSIS — I272 Pulmonary hypertension, unspecified: Secondary | ICD-10-CM | POA: Diagnosis not present

## 2018-07-08 DIAGNOSIS — D631 Anemia in chronic kidney disease: Secondary | ICD-10-CM | POA: Diagnosis not present

## 2018-07-09 DIAGNOSIS — N2581 Secondary hyperparathyroidism of renal origin: Secondary | ICD-10-CM | POA: Diagnosis not present

## 2018-07-09 DIAGNOSIS — D631 Anemia in chronic kidney disease: Secondary | ICD-10-CM | POA: Diagnosis not present

## 2018-07-09 DIAGNOSIS — N186 End stage renal disease: Secondary | ICD-10-CM | POA: Diagnosis not present

## 2018-07-10 DIAGNOSIS — I5022 Chronic systolic (congestive) heart failure: Secondary | ICD-10-CM | POA: Diagnosis not present

## 2018-07-10 DIAGNOSIS — N186 End stage renal disease: Secondary | ICD-10-CM | POA: Diagnosis not present

## 2018-07-10 DIAGNOSIS — E1122 Type 2 diabetes mellitus with diabetic chronic kidney disease: Secondary | ICD-10-CM | POA: Diagnosis not present

## 2018-07-10 DIAGNOSIS — D631 Anemia in chronic kidney disease: Secondary | ICD-10-CM | POA: Diagnosis not present

## 2018-07-10 DIAGNOSIS — I272 Pulmonary hypertension, unspecified: Secondary | ICD-10-CM | POA: Diagnosis not present

## 2018-07-10 DIAGNOSIS — I132 Hypertensive heart and chronic kidney disease with heart failure and with stage 5 chronic kidney disease, or end stage renal disease: Secondary | ICD-10-CM | POA: Diagnosis not present

## 2018-07-11 DIAGNOSIS — D631 Anemia in chronic kidney disease: Secondary | ICD-10-CM | POA: Diagnosis not present

## 2018-07-11 DIAGNOSIS — N186 End stage renal disease: Secondary | ICD-10-CM | POA: Diagnosis not present

## 2018-07-11 DIAGNOSIS — N2581 Secondary hyperparathyroidism of renal origin: Secondary | ICD-10-CM | POA: Diagnosis not present

## 2018-07-14 DIAGNOSIS — N2581 Secondary hyperparathyroidism of renal origin: Secondary | ICD-10-CM | POA: Diagnosis not present

## 2018-07-14 DIAGNOSIS — D631 Anemia in chronic kidney disease: Secondary | ICD-10-CM | POA: Diagnosis not present

## 2018-07-14 DIAGNOSIS — N186 End stage renal disease: Secondary | ICD-10-CM | POA: Diagnosis not present

## 2018-07-15 DIAGNOSIS — I10 Essential (primary) hypertension: Secondary | ICD-10-CM | POA: Diagnosis not present

## 2018-07-15 DIAGNOSIS — E0840 Diabetes mellitus due to underlying condition with diabetic neuropathy, unspecified: Secondary | ICD-10-CM | POA: Diagnosis not present

## 2018-07-15 DIAGNOSIS — I272 Pulmonary hypertension, unspecified: Secondary | ICD-10-CM | POA: Diagnosis not present

## 2018-07-15 DIAGNOSIS — D631 Anemia in chronic kidney disease: Secondary | ICD-10-CM | POA: Diagnosis not present

## 2018-07-15 DIAGNOSIS — I509 Heart failure, unspecified: Secondary | ICD-10-CM | POA: Diagnosis not present

## 2018-07-15 DIAGNOSIS — I2583 Coronary atherosclerosis due to lipid rich plaque: Secondary | ICD-10-CM | POA: Diagnosis not present

## 2018-07-15 DIAGNOSIS — N186 End stage renal disease: Secondary | ICD-10-CM | POA: Diagnosis not present

## 2018-07-15 DIAGNOSIS — R079 Chest pain, unspecified: Secondary | ICD-10-CM | POA: Diagnosis not present

## 2018-07-15 DIAGNOSIS — I132 Hypertensive heart and chronic kidney disease with heart failure and with stage 5 chronic kidney disease, or end stage renal disease: Secondary | ICD-10-CM | POA: Diagnosis not present

## 2018-07-15 DIAGNOSIS — E1122 Type 2 diabetes mellitus with diabetic chronic kidney disease: Secondary | ICD-10-CM | POA: Diagnosis not present

## 2018-07-15 DIAGNOSIS — I5022 Chronic systolic (congestive) heart failure: Secondary | ICD-10-CM | POA: Diagnosis not present

## 2018-07-16 DIAGNOSIS — Z9981 Dependence on supplemental oxygen: Secondary | ICD-10-CM | POA: Diagnosis not present

## 2018-07-16 DIAGNOSIS — N2581 Secondary hyperparathyroidism of renal origin: Secondary | ICD-10-CM | POA: Diagnosis not present

## 2018-07-16 DIAGNOSIS — I252 Old myocardial infarction: Secondary | ICD-10-CM | POA: Diagnosis not present

## 2018-07-16 DIAGNOSIS — N186 End stage renal disease: Secondary | ICD-10-CM | POA: Diagnosis not present

## 2018-07-16 DIAGNOSIS — Z9181 History of falling: Secondary | ICD-10-CM | POA: Diagnosis not present

## 2018-07-16 DIAGNOSIS — E1140 Type 2 diabetes mellitus with diabetic neuropathy, unspecified: Secondary | ICD-10-CM | POA: Diagnosis not present

## 2018-07-16 DIAGNOSIS — I2510 Atherosclerotic heart disease of native coronary artery without angina pectoris: Secondary | ICD-10-CM | POA: Diagnosis not present

## 2018-07-16 DIAGNOSIS — I5022 Chronic systolic (congestive) heart failure: Secondary | ICD-10-CM | POA: Diagnosis not present

## 2018-07-16 DIAGNOSIS — D631 Anemia in chronic kidney disease: Secondary | ICD-10-CM | POA: Diagnosis not present

## 2018-07-16 DIAGNOSIS — I081 Rheumatic disorders of both mitral and tricuspid valves: Secondary | ICD-10-CM | POA: Diagnosis not present

## 2018-07-16 DIAGNOSIS — E785 Hyperlipidemia, unspecified: Secondary | ICD-10-CM | POA: Diagnosis not present

## 2018-07-16 DIAGNOSIS — Z951 Presence of aortocoronary bypass graft: Secondary | ICD-10-CM | POA: Diagnosis not present

## 2018-07-16 DIAGNOSIS — I132 Hypertensive heart and chronic kidney disease with heart failure and with stage 5 chronic kidney disease, or end stage renal disease: Secondary | ICD-10-CM | POA: Diagnosis not present

## 2018-07-16 DIAGNOSIS — E1122 Type 2 diabetes mellitus with diabetic chronic kidney disease: Secondary | ICD-10-CM | POA: Diagnosis not present

## 2018-07-16 DIAGNOSIS — Z955 Presence of coronary angioplasty implant and graft: Secondary | ICD-10-CM | POA: Diagnosis not present

## 2018-07-16 DIAGNOSIS — Z7902 Long term (current) use of antithrombotics/antiplatelets: Secondary | ICD-10-CM | POA: Diagnosis not present

## 2018-07-16 DIAGNOSIS — Z86718 Personal history of other venous thrombosis and embolism: Secondary | ICD-10-CM | POA: Diagnosis not present

## 2018-07-16 DIAGNOSIS — E11319 Type 2 diabetes mellitus with unspecified diabetic retinopathy without macular edema: Secondary | ICD-10-CM | POA: Diagnosis not present

## 2018-07-16 DIAGNOSIS — Z992 Dependence on renal dialysis: Secondary | ICD-10-CM | POA: Diagnosis not present

## 2018-07-16 DIAGNOSIS — I272 Pulmonary hypertension, unspecified: Secondary | ICD-10-CM | POA: Diagnosis not present

## 2018-07-17 DIAGNOSIS — E1122 Type 2 diabetes mellitus with diabetic chronic kidney disease: Secondary | ICD-10-CM | POA: Diagnosis not present

## 2018-07-17 DIAGNOSIS — I5022 Chronic systolic (congestive) heart failure: Secondary | ICD-10-CM | POA: Diagnosis not present

## 2018-07-17 DIAGNOSIS — N186 End stage renal disease: Secondary | ICD-10-CM | POA: Diagnosis not present

## 2018-07-17 DIAGNOSIS — I081 Rheumatic disorders of both mitral and tricuspid valves: Secondary | ICD-10-CM | POA: Diagnosis not present

## 2018-07-17 DIAGNOSIS — I2510 Atherosclerotic heart disease of native coronary artery without angina pectoris: Secondary | ICD-10-CM | POA: Diagnosis not present

## 2018-07-17 DIAGNOSIS — I132 Hypertensive heart and chronic kidney disease with heart failure and with stage 5 chronic kidney disease, or end stage renal disease: Secondary | ICD-10-CM | POA: Diagnosis not present

## 2018-07-17 DIAGNOSIS — I272 Pulmonary hypertension, unspecified: Secondary | ICD-10-CM | POA: Diagnosis not present

## 2018-07-18 DIAGNOSIS — R918 Other nonspecific abnormal finding of lung field: Secondary | ICD-10-CM | POA: Diagnosis not present

## 2018-07-18 DIAGNOSIS — Z89429 Acquired absence of other toe(s), unspecified side: Secondary | ICD-10-CM | POA: Diagnosis not present

## 2018-07-18 DIAGNOSIS — I132 Hypertensive heart and chronic kidney disease with heart failure and with stage 5 chronic kidney disease, or end stage renal disease: Secondary | ICD-10-CM | POA: Diagnosis not present

## 2018-07-18 DIAGNOSIS — M79602 Pain in left arm: Secondary | ICD-10-CM | POA: Diagnosis not present

## 2018-07-18 DIAGNOSIS — N186 End stage renal disease: Secondary | ICD-10-CM | POA: Diagnosis not present

## 2018-07-18 DIAGNOSIS — I340 Nonrheumatic mitral (valve) insufficiency: Secondary | ICD-10-CM | POA: Diagnosis not present

## 2018-07-18 DIAGNOSIS — I5032 Chronic diastolic (congestive) heart failure: Secondary | ICD-10-CM | POA: Diagnosis not present

## 2018-07-18 DIAGNOSIS — I255 Ischemic cardiomyopathy: Secondary | ICD-10-CM | POA: Diagnosis not present

## 2018-07-18 DIAGNOSIS — Z951 Presence of aortocoronary bypass graft: Secondary | ICD-10-CM | POA: Diagnosis not present

## 2018-07-18 DIAGNOSIS — J90 Pleural effusion, not elsewhere classified: Secondary | ICD-10-CM | POA: Diagnosis not present

## 2018-07-18 DIAGNOSIS — Z79899 Other long term (current) drug therapy: Secondary | ICD-10-CM | POA: Diagnosis not present

## 2018-07-18 DIAGNOSIS — R0789 Other chest pain: Secondary | ICD-10-CM | POA: Diagnosis not present

## 2018-07-18 DIAGNOSIS — I10 Essential (primary) hypertension: Secondary | ICD-10-CM | POA: Diagnosis not present

## 2018-07-18 DIAGNOSIS — E1122 Type 2 diabetes mellitus with diabetic chronic kidney disease: Secondary | ICD-10-CM | POA: Diagnosis not present

## 2018-07-18 DIAGNOSIS — Z743 Need for continuous supervision: Secondary | ICD-10-CM | POA: Diagnosis not present

## 2018-07-18 DIAGNOSIS — Z992 Dependence on renal dialysis: Secondary | ICD-10-CM | POA: Diagnosis not present

## 2018-07-18 DIAGNOSIS — R9431 Abnormal electrocardiogram [ECG] [EKG]: Secondary | ICD-10-CM | POA: Diagnosis not present

## 2018-07-18 DIAGNOSIS — R079 Chest pain, unspecified: Secondary | ICD-10-CM | POA: Diagnosis not present

## 2018-07-18 DIAGNOSIS — D631 Anemia in chronic kidney disease: Secondary | ICD-10-CM | POA: Diagnosis not present

## 2018-07-18 DIAGNOSIS — E782 Mixed hyperlipidemia: Secondary | ICD-10-CM | POA: Diagnosis not present

## 2018-07-18 DIAGNOSIS — I509 Heart failure, unspecified: Secondary | ICD-10-CM | POA: Diagnosis not present

## 2018-07-18 DIAGNOSIS — R590 Localized enlarged lymph nodes: Secondary | ICD-10-CM | POA: Diagnosis not present

## 2018-07-18 DIAGNOSIS — J9811 Atelectasis: Secondary | ICD-10-CM | POA: Diagnosis not present

## 2018-07-18 DIAGNOSIS — E871 Hypo-osmolality and hyponatremia: Secondary | ICD-10-CM | POA: Diagnosis not present

## 2018-07-18 DIAGNOSIS — R0989 Other specified symptoms and signs involving the circulatory and respiratory systems: Secondary | ICD-10-CM | POA: Diagnosis not present

## 2018-07-18 DIAGNOSIS — I2510 Atherosclerotic heart disease of native coronary artery without angina pectoris: Secondary | ICD-10-CM | POA: Diagnosis not present

## 2018-07-19 DIAGNOSIS — I10 Essential (primary) hypertension: Secondary | ICD-10-CM | POA: Diagnosis not present

## 2018-07-19 DIAGNOSIS — J90 Pleural effusion, not elsewhere classified: Secondary | ICD-10-CM | POA: Diagnosis not present

## 2018-07-19 DIAGNOSIS — N186 End stage renal disease: Secondary | ICD-10-CM | POA: Diagnosis not present

## 2018-07-19 DIAGNOSIS — I340 Nonrheumatic mitral (valve) insufficiency: Secondary | ICD-10-CM | POA: Diagnosis not present

## 2018-07-19 DIAGNOSIS — R0789 Other chest pain: Secondary | ICD-10-CM | POA: Diagnosis not present

## 2018-07-19 DIAGNOSIS — I2510 Atherosclerotic heart disease of native coronary artery without angina pectoris: Secondary | ICD-10-CM | POA: Diagnosis not present

## 2018-07-21 DIAGNOSIS — D631 Anemia in chronic kidney disease: Secondary | ICD-10-CM | POA: Diagnosis not present

## 2018-07-21 DIAGNOSIS — N186 End stage renal disease: Secondary | ICD-10-CM | POA: Diagnosis not present

## 2018-07-21 DIAGNOSIS — N2581 Secondary hyperparathyroidism of renal origin: Secondary | ICD-10-CM | POA: Diagnosis not present

## 2018-07-22 DIAGNOSIS — G8918 Other acute postprocedural pain: Secondary | ICD-10-CM | POA: Diagnosis not present

## 2018-07-22 DIAGNOSIS — N250 Renal osteodystrophy: Secondary | ICD-10-CM | POA: Diagnosis present

## 2018-07-22 DIAGNOSIS — R1031 Right lower quadrant pain: Secondary | ICD-10-CM | POA: Diagnosis not present

## 2018-07-22 DIAGNOSIS — D631 Anemia in chronic kidney disease: Secondary | ICD-10-CM | POA: Diagnosis present

## 2018-07-22 DIAGNOSIS — B351 Tinea unguium: Secondary | ICD-10-CM | POA: Diagnosis not present

## 2018-07-22 DIAGNOSIS — R918 Other nonspecific abnormal finding of lung field: Secondary | ICD-10-CM | POA: Diagnosis not present

## 2018-07-22 DIAGNOSIS — E1159 Type 2 diabetes mellitus with other circulatory complications: Secondary | ICD-10-CM | POA: Diagnosis not present

## 2018-07-22 DIAGNOSIS — J189 Pneumonia, unspecified organism: Secondary | ICD-10-CM | POA: Diagnosis not present

## 2018-07-22 DIAGNOSIS — I252 Old myocardial infarction: Secondary | ICD-10-CM | POA: Diagnosis not present

## 2018-07-22 DIAGNOSIS — Z89412 Acquired absence of left great toe: Secondary | ICD-10-CM | POA: Diagnosis not present

## 2018-07-22 DIAGNOSIS — R0789 Other chest pain: Secondary | ICD-10-CM | POA: Diagnosis not present

## 2018-07-22 DIAGNOSIS — E871 Hypo-osmolality and hyponatremia: Secondary | ICD-10-CM | POA: Diagnosis not present

## 2018-07-22 DIAGNOSIS — I081 Rheumatic disorders of both mitral and tricuspid valves: Secondary | ICD-10-CM | POA: Diagnosis present

## 2018-07-22 DIAGNOSIS — I10 Essential (primary) hypertension: Secondary | ICD-10-CM | POA: Diagnosis not present

## 2018-07-22 DIAGNOSIS — E1122 Type 2 diabetes mellitus with diabetic chronic kidney disease: Secondary | ICD-10-CM | POA: Diagnosis present

## 2018-07-22 DIAGNOSIS — F1411 Cocaine abuse, in remission: Secondary | ICD-10-CM | POA: Diagnosis present

## 2018-07-22 DIAGNOSIS — I5022 Chronic systolic (congestive) heart failure: Secondary | ICD-10-CM | POA: Diagnosis not present

## 2018-07-22 DIAGNOSIS — I272 Pulmonary hypertension, unspecified: Secondary | ICD-10-CM | POA: Diagnosis present

## 2018-07-22 DIAGNOSIS — E785 Hyperlipidemia, unspecified: Secondary | ICD-10-CM | POA: Diagnosis present

## 2018-07-22 DIAGNOSIS — Z951 Presence of aortocoronary bypass graft: Secondary | ICD-10-CM | POA: Diagnosis not present

## 2018-07-22 DIAGNOSIS — Z8679 Personal history of other diseases of the circulatory system: Secondary | ICD-10-CM | POA: Diagnosis not present

## 2018-07-22 DIAGNOSIS — I132 Hypertensive heart and chronic kidney disease with heart failure and with stage 5 chronic kidney disease, or end stage renal disease: Secondary | ICD-10-CM | POA: Diagnosis present

## 2018-07-22 DIAGNOSIS — I214 Non-ST elevation (NSTEMI) myocardial infarction: Secondary | ICD-10-CM | POA: Diagnosis not present

## 2018-07-22 DIAGNOSIS — F1211 Cannabis abuse, in remission: Secondary | ICD-10-CM | POA: Diagnosis present

## 2018-07-22 DIAGNOSIS — I25119 Atherosclerotic heart disease of native coronary artery with unspecified angina pectoris: Secondary | ICD-10-CM | POA: Diagnosis not present

## 2018-07-22 DIAGNOSIS — R000 Tachycardia, unspecified: Secondary | ICD-10-CM | POA: Diagnosis present

## 2018-07-22 DIAGNOSIS — I2510 Atherosclerotic heart disease of native coronary artery without angina pectoris: Secondary | ICD-10-CM | POA: Diagnosis not present

## 2018-07-22 DIAGNOSIS — Z87891 Personal history of nicotine dependence: Secondary | ICD-10-CM | POA: Diagnosis not present

## 2018-07-22 DIAGNOSIS — I5043 Acute on chronic combined systolic (congestive) and diastolic (congestive) heart failure: Secondary | ICD-10-CM | POA: Diagnosis not present

## 2018-07-22 DIAGNOSIS — R079 Chest pain, unspecified: Secondary | ICD-10-CM | POA: Diagnosis not present

## 2018-07-22 DIAGNOSIS — N186 End stage renal disease: Secondary | ICD-10-CM | POA: Diagnosis not present

## 2018-07-22 DIAGNOSIS — I2582 Chronic total occlusion of coronary artery: Secondary | ICD-10-CM | POA: Diagnosis present

## 2018-07-22 DIAGNOSIS — J90 Pleural effusion, not elsewhere classified: Secondary | ICD-10-CM | POA: Diagnosis not present

## 2018-07-22 DIAGNOSIS — Z89429 Acquired absence of other toe(s), unspecified side: Secondary | ICD-10-CM | POA: Diagnosis not present

## 2018-07-22 DIAGNOSIS — E1142 Type 2 diabetes mellitus with diabetic polyneuropathy: Secondary | ICD-10-CM | POA: Diagnosis not present

## 2018-07-22 DIAGNOSIS — I255 Ischemic cardiomyopathy: Secondary | ICD-10-CM | POA: Diagnosis not present

## 2018-07-22 DIAGNOSIS — I97630 Postprocedural hematoma of a circulatory system organ or structure following a cardiac catheterization: Secondary | ICD-10-CM | POA: Diagnosis not present

## 2018-07-22 DIAGNOSIS — Z992 Dependence on renal dialysis: Secondary | ICD-10-CM | POA: Diagnosis not present

## 2018-07-22 DIAGNOSIS — Z743 Need for continuous supervision: Secondary | ICD-10-CM | POA: Diagnosis not present

## 2018-07-27 DIAGNOSIS — I2510 Atherosclerotic heart disease of native coronary artery without angina pectoris: Secondary | ICD-10-CM | POA: Diagnosis not present

## 2018-07-27 DIAGNOSIS — N186 End stage renal disease: Secondary | ICD-10-CM | POA: Diagnosis not present

## 2018-07-27 DIAGNOSIS — I272 Pulmonary hypertension, unspecified: Secondary | ICD-10-CM | POA: Diagnosis not present

## 2018-07-27 DIAGNOSIS — D631 Anemia in chronic kidney disease: Secondary | ICD-10-CM | POA: Diagnosis not present

## 2018-07-27 DIAGNOSIS — N2581 Secondary hyperparathyroidism of renal origin: Secondary | ICD-10-CM | POA: Diagnosis not present

## 2018-07-27 DIAGNOSIS — E1122 Type 2 diabetes mellitus with diabetic chronic kidney disease: Secondary | ICD-10-CM | POA: Diagnosis not present

## 2018-07-27 DIAGNOSIS — I132 Hypertensive heart and chronic kidney disease with heart failure and with stage 5 chronic kidney disease, or end stage renal disease: Secondary | ICD-10-CM | POA: Diagnosis not present

## 2018-07-27 DIAGNOSIS — I5022 Chronic systolic (congestive) heart failure: Secondary | ICD-10-CM | POA: Diagnosis not present

## 2018-07-29 DIAGNOSIS — N2581 Secondary hyperparathyroidism of renal origin: Secondary | ICD-10-CM | POA: Diagnosis not present

## 2018-07-29 DIAGNOSIS — I2510 Atherosclerotic heart disease of native coronary artery without angina pectoris: Secondary | ICD-10-CM | POA: Diagnosis not present

## 2018-07-29 DIAGNOSIS — I132 Hypertensive heart and chronic kidney disease with heart failure and with stage 5 chronic kidney disease, or end stage renal disease: Secondary | ICD-10-CM | POA: Diagnosis not present

## 2018-07-29 DIAGNOSIS — D631 Anemia in chronic kidney disease: Secondary | ICD-10-CM | POA: Diagnosis not present

## 2018-07-29 DIAGNOSIS — I272 Pulmonary hypertension, unspecified: Secondary | ICD-10-CM | POA: Diagnosis not present

## 2018-07-29 DIAGNOSIS — E1122 Type 2 diabetes mellitus with diabetic chronic kidney disease: Secondary | ICD-10-CM | POA: Diagnosis not present

## 2018-07-29 DIAGNOSIS — N186 End stage renal disease: Secondary | ICD-10-CM | POA: Diagnosis not present

## 2018-07-29 DIAGNOSIS — I5022 Chronic systolic (congestive) heart failure: Secondary | ICD-10-CM | POA: Diagnosis not present

## 2018-08-01 DIAGNOSIS — D631 Anemia in chronic kidney disease: Secondary | ICD-10-CM | POA: Diagnosis not present

## 2018-08-01 DIAGNOSIS — N2581 Secondary hyperparathyroidism of renal origin: Secondary | ICD-10-CM | POA: Diagnosis not present

## 2018-08-01 DIAGNOSIS — N186 End stage renal disease: Secondary | ICD-10-CM | POA: Diagnosis not present

## 2018-08-02 DIAGNOSIS — N186 End stage renal disease: Secondary | ICD-10-CM | POA: Diagnosis not present

## 2018-08-03 DIAGNOSIS — R0989 Other specified symptoms and signs involving the circulatory and respiratory systems: Secondary | ICD-10-CM | POA: Diagnosis not present

## 2018-08-03 DIAGNOSIS — R0789 Other chest pain: Secondary | ICD-10-CM | POA: Diagnosis not present

## 2018-08-03 DIAGNOSIS — I509 Heart failure, unspecified: Secondary | ICD-10-CM | POA: Diagnosis not present

## 2018-08-03 DIAGNOSIS — M79603 Pain in arm, unspecified: Secondary | ICD-10-CM | POA: Diagnosis not present

## 2018-08-03 DIAGNOSIS — I25118 Atherosclerotic heart disease of native coronary artery with other forms of angina pectoris: Secondary | ICD-10-CM | POA: Diagnosis not present

## 2018-08-03 DIAGNOSIS — Z743 Need for continuous supervision: Secondary | ICD-10-CM | POA: Diagnosis not present

## 2018-08-03 DIAGNOSIS — I517 Cardiomegaly: Secondary | ICD-10-CM | POA: Diagnosis not present

## 2018-08-03 DIAGNOSIS — R079 Chest pain, unspecified: Secondary | ICD-10-CM | POA: Diagnosis not present

## 2018-08-04 DIAGNOSIS — I25118 Atherosclerotic heart disease of native coronary artery with other forms of angina pectoris: Secondary | ICD-10-CM | POA: Diagnosis not present

## 2018-08-04 DIAGNOSIS — I2510 Atherosclerotic heart disease of native coronary artery without angina pectoris: Secondary | ICD-10-CM | POA: Diagnosis not present

## 2018-08-04 DIAGNOSIS — R079 Chest pain, unspecified: Secondary | ICD-10-CM | POA: Diagnosis not present

## 2018-08-04 DIAGNOSIS — D649 Anemia, unspecified: Secondary | ICD-10-CM | POA: Diagnosis not present

## 2018-08-04 DIAGNOSIS — I340 Nonrheumatic mitral (valve) insufficiency: Secondary | ICD-10-CM | POA: Diagnosis not present

## 2018-08-04 DIAGNOSIS — I5022 Chronic systolic (congestive) heart failure: Secondary | ICD-10-CM | POA: Diagnosis not present

## 2018-08-04 DIAGNOSIS — Z89429 Acquired absence of other toe(s), unspecified side: Secondary | ICD-10-CM | POA: Diagnosis not present

## 2018-08-04 DIAGNOSIS — I255 Ischemic cardiomyopathy: Secondary | ICD-10-CM | POA: Diagnosis not present

## 2018-08-04 IMAGING — DX DG ABDOMEN 2V
3 series · 3 of 3 positions shown · non-contrast
Comparison: CT abdomen pelvis 07/15/2017

CLINICAL DATA: Patient with generalized abdominal pain and
vomiting.

EXAM:
ABDOMEN - 2 VIEW

[abdomen erect]
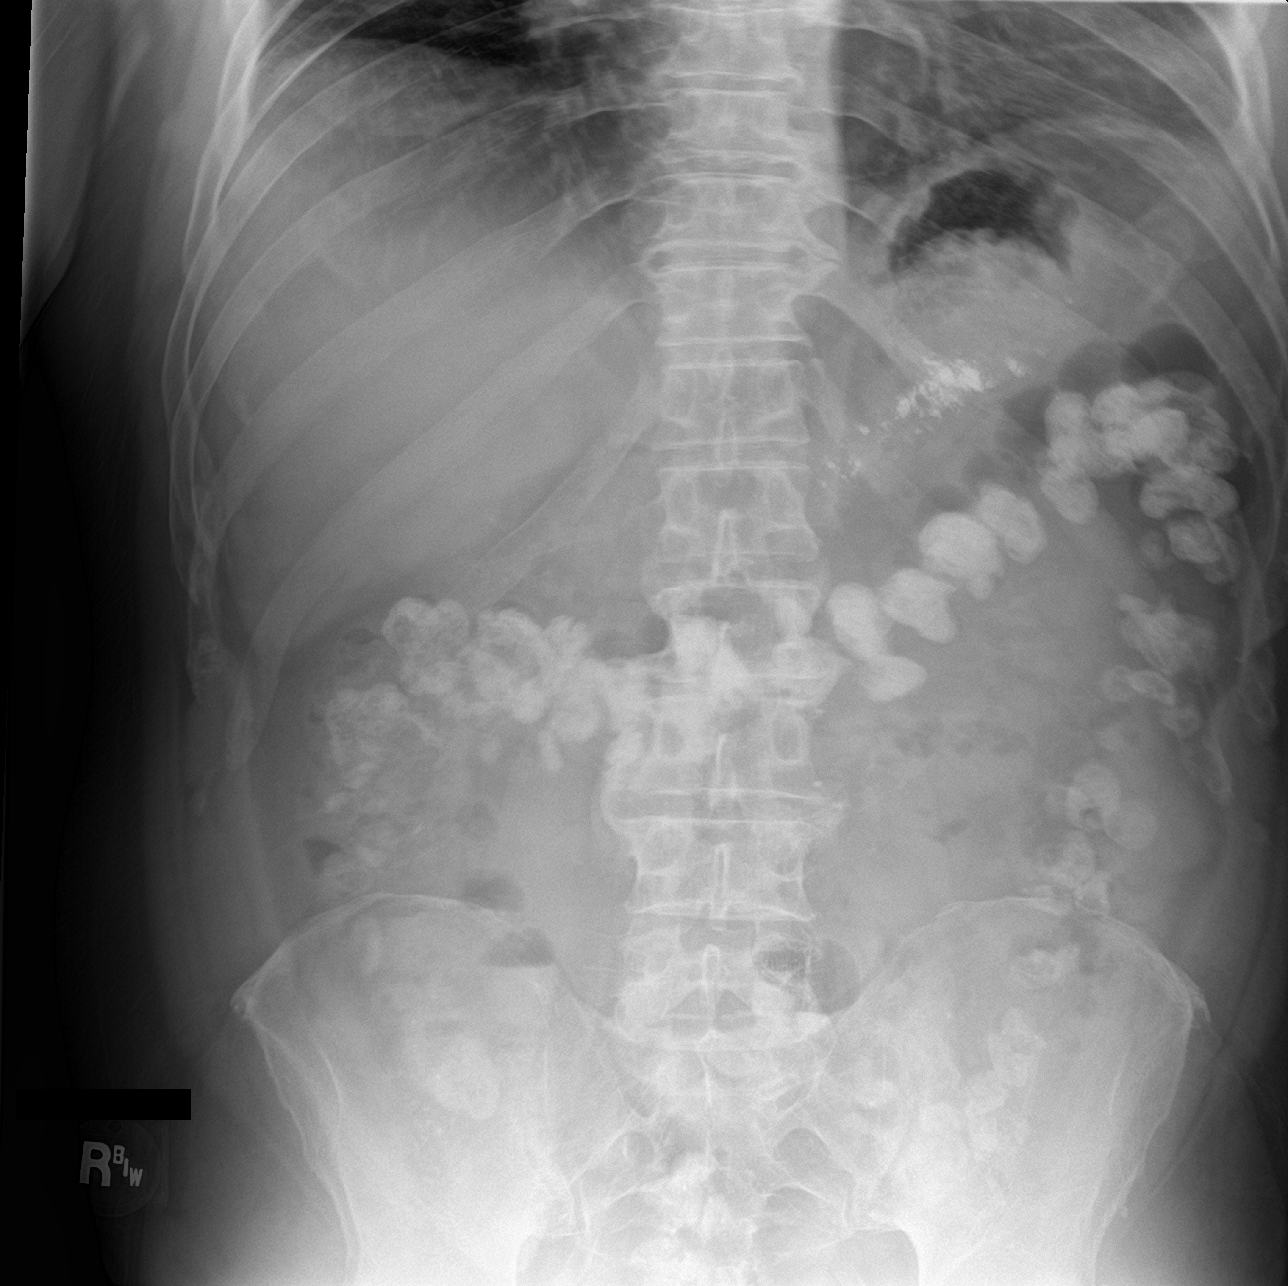

[abdomen supine (1 of 2)]
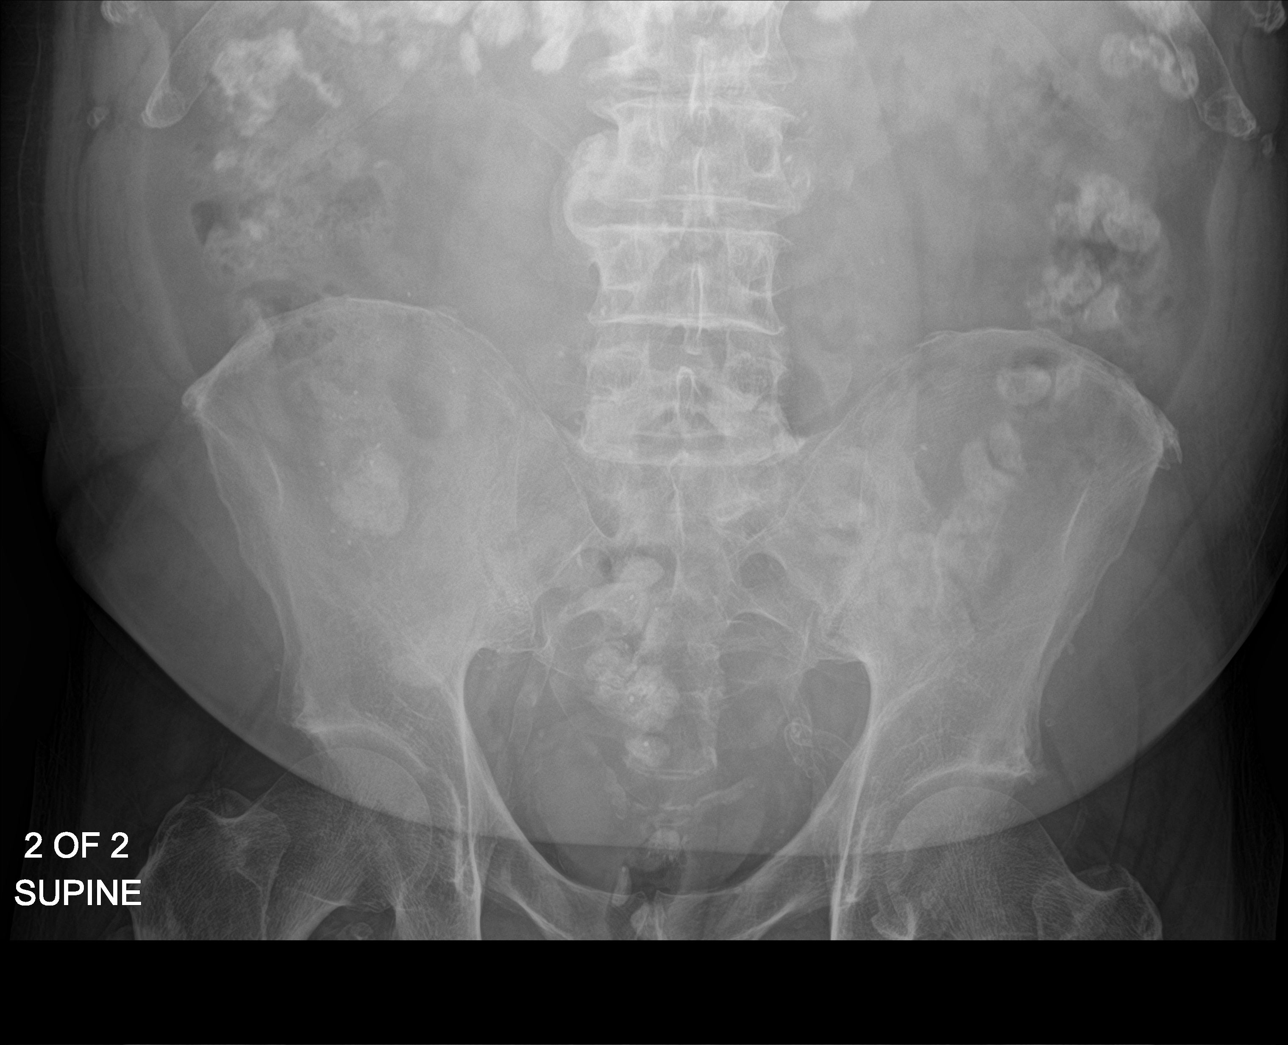

[abdomen supine (2 of 2)]
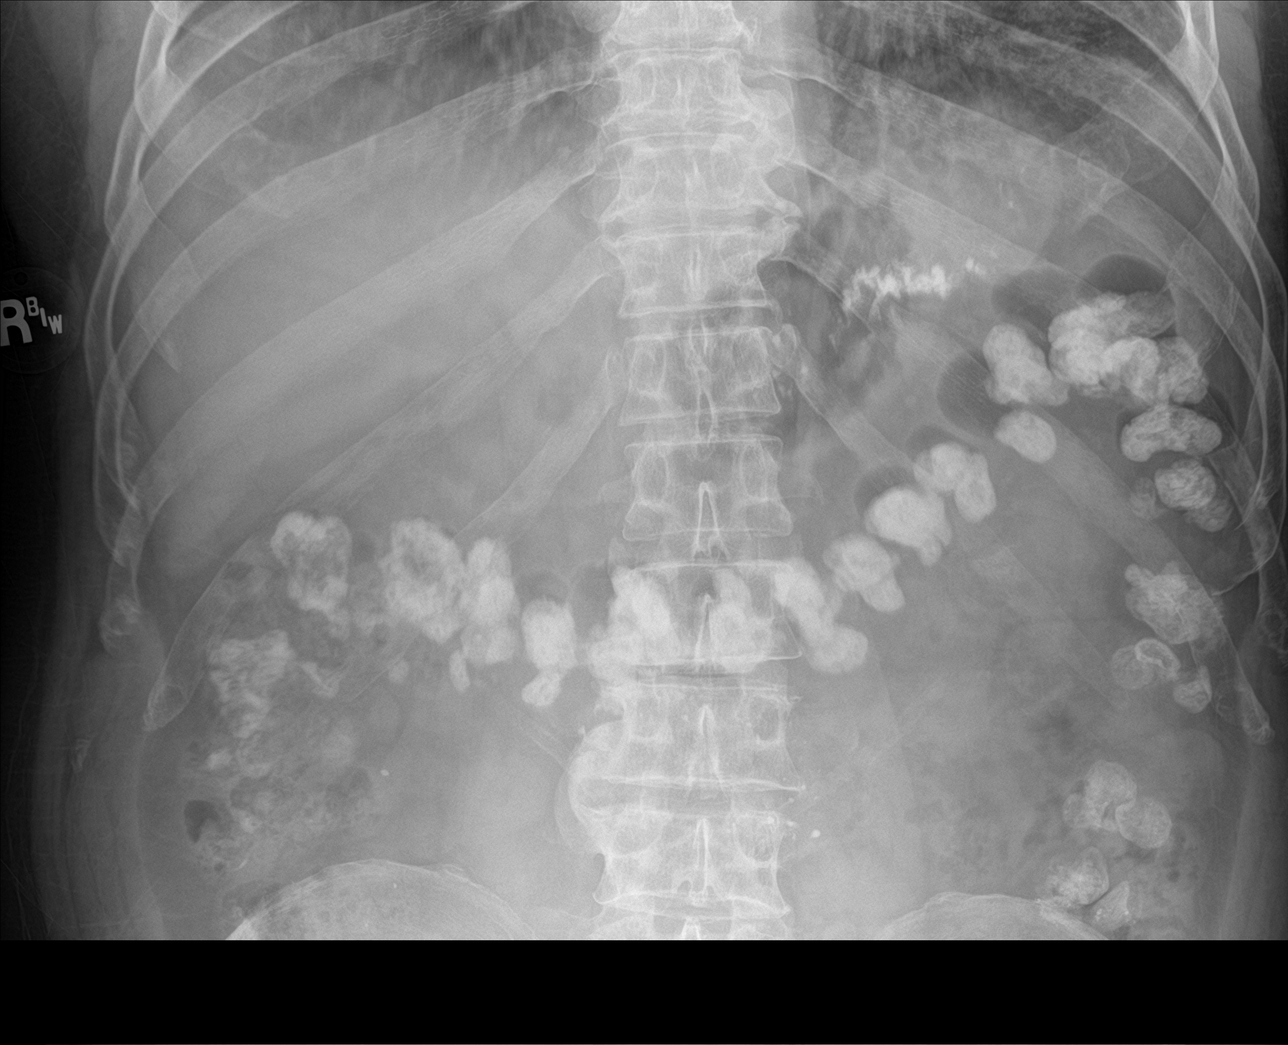

[3 of 3 positions shown; findings below may reference images not displayed]

FINDINGS: Oral contrast material within the colon. Gas is demonstrated within
nondilated loops of large and small bowel in a nonobstructed
pattern. No free intraperitoneal air. Lumbar spine degenerative
changes.
IMPRESSION: Nonobstructed bowel gas pattern.

## 2018-08-05 DIAGNOSIS — I2510 Atherosclerotic heart disease of native coronary artery without angina pectoris: Secondary | ICD-10-CM | POA: Diagnosis not present

## 2018-08-05 DIAGNOSIS — Z89429 Acquired absence of other toe(s), unspecified side: Secondary | ICD-10-CM | POA: Diagnosis not present

## 2018-08-05 DIAGNOSIS — I10 Essential (primary) hypertension: Secondary | ICD-10-CM | POA: Diagnosis not present

## 2018-08-05 DIAGNOSIS — R079 Chest pain, unspecified: Secondary | ICD-10-CM | POA: Diagnosis not present

## 2018-08-05 DIAGNOSIS — D649 Anemia, unspecified: Secondary | ICD-10-CM | POA: Diagnosis not present

## 2018-08-05 DIAGNOSIS — N186 End stage renal disease: Secondary | ICD-10-CM | POA: Diagnosis not present

## 2018-08-05 DIAGNOSIS — I25118 Atherosclerotic heart disease of native coronary artery with other forms of angina pectoris: Secondary | ICD-10-CM | POA: Diagnosis not present

## 2018-08-06 DIAGNOSIS — D631 Anemia in chronic kidney disease: Secondary | ICD-10-CM | POA: Diagnosis not present

## 2018-08-06 DIAGNOSIS — I2510 Atherosclerotic heart disease of native coronary artery without angina pectoris: Secondary | ICD-10-CM | POA: Diagnosis not present

## 2018-08-06 DIAGNOSIS — I5022 Chronic systolic (congestive) heart failure: Secondary | ICD-10-CM | POA: Diagnosis not present

## 2018-08-06 DIAGNOSIS — I272 Pulmonary hypertension, unspecified: Secondary | ICD-10-CM | POA: Diagnosis not present

## 2018-08-06 DIAGNOSIS — E1122 Type 2 diabetes mellitus with diabetic chronic kidney disease: Secondary | ICD-10-CM | POA: Diagnosis not present

## 2018-08-06 DIAGNOSIS — I132 Hypertensive heart and chronic kidney disease with heart failure and with stage 5 chronic kidney disease, or end stage renal disease: Secondary | ICD-10-CM | POA: Diagnosis not present

## 2018-08-06 DIAGNOSIS — N2581 Secondary hyperparathyroidism of renal origin: Secondary | ICD-10-CM | POA: Diagnosis not present

## 2018-08-06 DIAGNOSIS — N186 End stage renal disease: Secondary | ICD-10-CM | POA: Diagnosis not present

## 2018-08-08 DIAGNOSIS — E1122 Type 2 diabetes mellitus with diabetic chronic kidney disease: Secondary | ICD-10-CM | POA: Diagnosis not present

## 2018-08-08 DIAGNOSIS — I132 Hypertensive heart and chronic kidney disease with heart failure and with stage 5 chronic kidney disease, or end stage renal disease: Secondary | ICD-10-CM | POA: Diagnosis not present

## 2018-08-08 DIAGNOSIS — N186 End stage renal disease: Secondary | ICD-10-CM | POA: Diagnosis not present

## 2018-08-08 DIAGNOSIS — N2581 Secondary hyperparathyroidism of renal origin: Secondary | ICD-10-CM | POA: Diagnosis not present

## 2018-08-08 DIAGNOSIS — I272 Pulmonary hypertension, unspecified: Secondary | ICD-10-CM | POA: Diagnosis not present

## 2018-08-08 DIAGNOSIS — I2510 Atherosclerotic heart disease of native coronary artery without angina pectoris: Secondary | ICD-10-CM | POA: Diagnosis not present

## 2018-08-08 DIAGNOSIS — I5022 Chronic systolic (congestive) heart failure: Secondary | ICD-10-CM | POA: Diagnosis not present

## 2018-08-08 DIAGNOSIS — D631 Anemia in chronic kidney disease: Secondary | ICD-10-CM | POA: Diagnosis not present

## 2018-08-11 DIAGNOSIS — N186 End stage renal disease: Secondary | ICD-10-CM | POA: Diagnosis not present

## 2018-08-11 DIAGNOSIS — D631 Anemia in chronic kidney disease: Secondary | ICD-10-CM | POA: Diagnosis not present

## 2018-08-11 DIAGNOSIS — N2581 Secondary hyperparathyroidism of renal origin: Secondary | ICD-10-CM | POA: Diagnosis not present

## 2018-08-12 DIAGNOSIS — I132 Hypertensive heart and chronic kidney disease with heart failure and with stage 5 chronic kidney disease, or end stage renal disease: Secondary | ICD-10-CM | POA: Diagnosis not present

## 2018-08-12 DIAGNOSIS — I2510 Atherosclerotic heart disease of native coronary artery without angina pectoris: Secondary | ICD-10-CM | POA: Diagnosis not present

## 2018-08-12 DIAGNOSIS — I272 Pulmonary hypertension, unspecified: Secondary | ICD-10-CM | POA: Diagnosis not present

## 2018-08-12 DIAGNOSIS — E1122 Type 2 diabetes mellitus with diabetic chronic kidney disease: Secondary | ICD-10-CM | POA: Diagnosis not present

## 2018-08-12 DIAGNOSIS — I5022 Chronic systolic (congestive) heart failure: Secondary | ICD-10-CM | POA: Diagnosis not present

## 2018-08-12 DIAGNOSIS — N186 End stage renal disease: Secondary | ICD-10-CM | POA: Diagnosis not present

## 2018-08-13 DIAGNOSIS — J9620 Acute and chronic respiratory failure, unspecified whether with hypoxia or hypercapnia: Secondary | ICD-10-CM | POA: Diagnosis present

## 2018-08-13 DIAGNOSIS — I340 Nonrheumatic mitral (valve) insufficiency: Secondary | ICD-10-CM | POA: Diagnosis not present

## 2018-08-13 DIAGNOSIS — E785 Hyperlipidemia, unspecified: Secondary | ICD-10-CM | POA: Diagnosis not present

## 2018-08-13 DIAGNOSIS — R0602 Shortness of breath: Secondary | ICD-10-CM | POA: Diagnosis not present

## 2018-08-13 DIAGNOSIS — R0600 Dyspnea, unspecified: Secondary | ICD-10-CM | POA: Diagnosis not present

## 2018-08-13 DIAGNOSIS — Z8679 Personal history of other diseases of the circulatory system: Secondary | ICD-10-CM | POA: Diagnosis not present

## 2018-08-13 DIAGNOSIS — I5022 Chronic systolic (congestive) heart failure: Secondary | ICD-10-CM | POA: Diagnosis not present

## 2018-08-13 DIAGNOSIS — G8929 Other chronic pain: Secondary | ICD-10-CM | POA: Diagnosis present

## 2018-08-13 DIAGNOSIS — E1143 Type 2 diabetes mellitus with diabetic autonomic (poly)neuropathy: Secondary | ICD-10-CM | POA: Diagnosis present

## 2018-08-13 DIAGNOSIS — Z951 Presence of aortocoronary bypass graft: Secondary | ICD-10-CM | POA: Diagnosis not present

## 2018-08-13 DIAGNOSIS — Z87891 Personal history of nicotine dependence: Secondary | ICD-10-CM | POA: Diagnosis not present

## 2018-08-13 DIAGNOSIS — E1122 Type 2 diabetes mellitus with diabetic chronic kidney disease: Secondary | ICD-10-CM | POA: Diagnosis present

## 2018-08-13 DIAGNOSIS — J9601 Acute respiratory failure with hypoxia: Secondary | ICD-10-CM | POA: Diagnosis not present

## 2018-08-13 DIAGNOSIS — N186 End stage renal disease: Secondary | ICD-10-CM | POA: Diagnosis not present

## 2018-08-13 DIAGNOSIS — I252 Old myocardial infarction: Secondary | ICD-10-CM | POA: Diagnosis not present

## 2018-08-13 DIAGNOSIS — J189 Pneumonia, unspecified organism: Secondary | ICD-10-CM | POA: Diagnosis not present

## 2018-08-13 DIAGNOSIS — R0989 Other specified symptoms and signs involving the circulatory and respiratory systems: Secondary | ICD-10-CM | POA: Diagnosis not present

## 2018-08-13 DIAGNOSIS — I132 Hypertensive heart and chronic kidney disease with heart failure and with stage 5 chronic kidney disease, or end stage renal disease: Secondary | ICD-10-CM | POA: Diagnosis present

## 2018-08-13 DIAGNOSIS — I272 Pulmonary hypertension, unspecified: Secondary | ICD-10-CM | POA: Diagnosis present

## 2018-08-13 DIAGNOSIS — Z89412 Acquired absence of left great toe: Secondary | ICD-10-CM | POA: Diagnosis not present

## 2018-08-13 DIAGNOSIS — I5021 Acute systolic (congestive) heart failure: Secondary | ICD-10-CM | POA: Diagnosis not present

## 2018-08-13 DIAGNOSIS — Z992 Dependence on renal dialysis: Secondary | ICD-10-CM | POA: Diagnosis not present

## 2018-08-13 DIAGNOSIS — R918 Other nonspecific abnormal finding of lung field: Secondary | ICD-10-CM | POA: Diagnosis not present

## 2018-08-13 DIAGNOSIS — K5900 Constipation, unspecified: Secondary | ICD-10-CM | POA: Diagnosis present

## 2018-08-13 DIAGNOSIS — I517 Cardiomegaly: Secondary | ICD-10-CM | POA: Diagnosis not present

## 2018-08-13 DIAGNOSIS — J9811 Atelectasis: Secondary | ICD-10-CM | POA: Diagnosis not present

## 2018-08-13 DIAGNOSIS — Z23 Encounter for immunization: Secondary | ICD-10-CM | POA: Diagnosis not present

## 2018-08-13 DIAGNOSIS — I509 Heart failure, unspecified: Secondary | ICD-10-CM | POA: Diagnosis not present

## 2018-08-13 DIAGNOSIS — D631 Anemia in chronic kidney disease: Secondary | ICD-10-CM | POA: Diagnosis present

## 2018-08-13 DIAGNOSIS — I5023 Acute on chronic systolic (congestive) heart failure: Secondary | ICD-10-CM | POA: Diagnosis not present

## 2018-08-13 DIAGNOSIS — Z86718 Personal history of other venous thrombosis and embolism: Secondary | ICD-10-CM | POA: Diagnosis not present

## 2018-08-13 DIAGNOSIS — R0609 Other forms of dyspnea: Secondary | ICD-10-CM | POA: Diagnosis not present

## 2018-08-13 DIAGNOSIS — E119 Type 2 diabetes mellitus without complications: Secondary | ICD-10-CM | POA: Diagnosis not present

## 2018-08-13 DIAGNOSIS — J90 Pleural effusion, not elsewhere classified: Secondary | ICD-10-CM | POA: Diagnosis not present

## 2018-08-13 DIAGNOSIS — I2510 Atherosclerotic heart disease of native coronary artery without angina pectoris: Secondary | ICD-10-CM | POA: Diagnosis not present

## 2018-08-13 DIAGNOSIS — I10 Essential (primary) hypertension: Secondary | ICD-10-CM | POA: Diagnosis not present

## 2018-08-13 DIAGNOSIS — K2970 Gastritis, unspecified, without bleeding: Secondary | ICD-10-CM | POA: Diagnosis present

## 2018-08-13 DIAGNOSIS — E871 Hypo-osmolality and hyponatremia: Secondary | ICD-10-CM | POA: Diagnosis present

## 2018-08-13 DIAGNOSIS — R0789 Other chest pain: Secondary | ICD-10-CM | POA: Diagnosis not present

## 2018-08-13 DIAGNOSIS — I5043 Acute on chronic combined systolic (congestive) and diastolic (congestive) heart failure: Secondary | ICD-10-CM | POA: Diagnosis not present

## 2018-08-13 DIAGNOSIS — I081 Rheumatic disorders of both mitral and tricuspid valves: Secondary | ICD-10-CM | POA: Diagnosis present

## 2018-08-13 DIAGNOSIS — I255 Ischemic cardiomyopathy: Secondary | ICD-10-CM | POA: Diagnosis not present

## 2018-08-13 DIAGNOSIS — Z48813 Encounter for surgical aftercare following surgery on the respiratory system: Secondary | ICD-10-CM | POA: Diagnosis not present

## 2018-08-13 DIAGNOSIS — Z743 Need for continuous supervision: Secondary | ICD-10-CM | POA: Diagnosis not present

## 2018-08-18 DIAGNOSIS — I5022 Chronic systolic (congestive) heart failure: Secondary | ICD-10-CM | POA: Diagnosis not present

## 2018-08-18 DIAGNOSIS — I132 Hypertensive heart and chronic kidney disease with heart failure and with stage 5 chronic kidney disease, or end stage renal disease: Secondary | ICD-10-CM | POA: Diagnosis not present

## 2018-08-18 DIAGNOSIS — I2510 Atherosclerotic heart disease of native coronary artery without angina pectoris: Secondary | ICD-10-CM | POA: Diagnosis not present

## 2018-08-18 DIAGNOSIS — I272 Pulmonary hypertension, unspecified: Secondary | ICD-10-CM | POA: Diagnosis not present

## 2018-08-18 DIAGNOSIS — N186 End stage renal disease: Secondary | ICD-10-CM | POA: Diagnosis not present

## 2018-08-18 DIAGNOSIS — E1122 Type 2 diabetes mellitus with diabetic chronic kidney disease: Secondary | ICD-10-CM | POA: Diagnosis not present

## 2018-08-19 DIAGNOSIS — N2581 Secondary hyperparathyroidism of renal origin: Secondary | ICD-10-CM | POA: Diagnosis not present

## 2018-08-19 DIAGNOSIS — I340 Nonrheumatic mitral (valve) insufficiency: Secondary | ICD-10-CM | POA: Diagnosis not present

## 2018-08-19 DIAGNOSIS — D631 Anemia in chronic kidney disease: Secondary | ICD-10-CM | POA: Diagnosis not present

## 2018-08-19 DIAGNOSIS — Z992 Dependence on renal dialysis: Secondary | ICD-10-CM | POA: Diagnosis not present

## 2018-08-19 DIAGNOSIS — E1122 Type 2 diabetes mellitus with diabetic chronic kidney disease: Secondary | ICD-10-CM | POA: Diagnosis not present

## 2018-08-19 DIAGNOSIS — I272 Pulmonary hypertension, unspecified: Secondary | ICD-10-CM | POA: Diagnosis not present

## 2018-08-19 DIAGNOSIS — N186 End stage renal disease: Secondary | ICD-10-CM | POA: Diagnosis not present

## 2018-08-19 DIAGNOSIS — Z7901 Long term (current) use of anticoagulants: Secondary | ICD-10-CM | POA: Diagnosis not present

## 2018-08-19 DIAGNOSIS — Z0184 Encounter for antibody response examination: Secondary | ICD-10-CM | POA: Diagnosis not present

## 2018-08-19 DIAGNOSIS — E878 Other disorders of electrolyte and fluid balance, not elsewhere classified: Secondary | ICD-10-CM | POA: Diagnosis not present

## 2018-08-19 DIAGNOSIS — I255 Ischemic cardiomyopathy: Secondary | ICD-10-CM | POA: Diagnosis not present

## 2018-08-19 DIAGNOSIS — I214 Non-ST elevation (NSTEMI) myocardial infarction: Secondary | ICD-10-CM | POA: Diagnosis not present

## 2018-08-19 DIAGNOSIS — I2510 Atherosclerotic heart disease of native coronary artery without angina pectoris: Secondary | ICD-10-CM | POA: Diagnosis not present

## 2018-08-19 DIAGNOSIS — E7849 Other hyperlipidemia: Secondary | ICD-10-CM | POA: Diagnosis not present

## 2018-08-19 DIAGNOSIS — I5022 Chronic systolic (congestive) heart failure: Secondary | ICD-10-CM | POA: Diagnosis not present

## 2018-08-19 DIAGNOSIS — I132 Hypertensive heart and chronic kidney disease with heart failure and with stage 5 chronic kidney disease, or end stage renal disease: Secondary | ICD-10-CM | POA: Diagnosis not present

## 2018-08-19 DIAGNOSIS — I10 Essential (primary) hypertension: Secondary | ICD-10-CM | POA: Diagnosis not present

## 2018-08-19 DIAGNOSIS — E8770 Fluid overload, unspecified: Secondary | ICD-10-CM | POA: Diagnosis not present

## 2018-08-19 DIAGNOSIS — E1129 Type 2 diabetes mellitus with other diabetic kidney complication: Secondary | ICD-10-CM | POA: Diagnosis not present

## 2018-08-19 DIAGNOSIS — D508 Other iron deficiency anemias: Secondary | ICD-10-CM | POA: Diagnosis not present

## 2018-08-20 DIAGNOSIS — E1129 Type 2 diabetes mellitus with other diabetic kidney complication: Secondary | ICD-10-CM | POA: Diagnosis not present

## 2018-08-20 DIAGNOSIS — Z7901 Long term (current) use of anticoagulants: Secondary | ICD-10-CM | POA: Diagnosis not present

## 2018-08-20 DIAGNOSIS — Z992 Dependence on renal dialysis: Secondary | ICD-10-CM | POA: Diagnosis not present

## 2018-08-20 DIAGNOSIS — N186 End stage renal disease: Secondary | ICD-10-CM | POA: Diagnosis not present

## 2018-08-20 DIAGNOSIS — E8770 Fluid overload, unspecified: Secondary | ICD-10-CM | POA: Diagnosis not present

## 2018-08-20 DIAGNOSIS — I214 Non-ST elevation (NSTEMI) myocardial infarction: Secondary | ICD-10-CM | POA: Diagnosis not present

## 2018-08-21 DIAGNOSIS — E1129 Type 2 diabetes mellitus with other diabetic kidney complication: Secondary | ICD-10-CM | POA: Diagnosis not present

## 2018-08-21 DIAGNOSIS — N186 End stage renal disease: Secondary | ICD-10-CM | POA: Diagnosis not present

## 2018-08-21 DIAGNOSIS — I132 Hypertensive heart and chronic kidney disease with heart failure and with stage 5 chronic kidney disease, or end stage renal disease: Secondary | ICD-10-CM | POA: Diagnosis not present

## 2018-08-21 DIAGNOSIS — I214 Non-ST elevation (NSTEMI) myocardial infarction: Secondary | ICD-10-CM | POA: Diagnosis not present

## 2018-08-21 DIAGNOSIS — E8770 Fluid overload, unspecified: Secondary | ICD-10-CM | POA: Diagnosis not present

## 2018-08-21 DIAGNOSIS — E1122 Type 2 diabetes mellitus with diabetic chronic kidney disease: Secondary | ICD-10-CM | POA: Diagnosis not present

## 2018-08-21 DIAGNOSIS — I2510 Atherosclerotic heart disease of native coronary artery without angina pectoris: Secondary | ICD-10-CM | POA: Diagnosis not present

## 2018-08-21 DIAGNOSIS — I272 Pulmonary hypertension, unspecified: Secondary | ICD-10-CM | POA: Diagnosis not present

## 2018-08-21 DIAGNOSIS — Z992 Dependence on renal dialysis: Secondary | ICD-10-CM | POA: Diagnosis not present

## 2018-08-21 DIAGNOSIS — Z7901 Long term (current) use of anticoagulants: Secondary | ICD-10-CM | POA: Diagnosis not present

## 2018-08-21 DIAGNOSIS — I5022 Chronic systolic (congestive) heart failure: Secondary | ICD-10-CM | POA: Diagnosis not present

## 2018-08-22 DIAGNOSIS — N186 End stage renal disease: Secondary | ICD-10-CM | POA: Diagnosis not present

## 2018-08-22 DIAGNOSIS — I10 Essential (primary) hypertension: Secondary | ICD-10-CM | POA: Diagnosis not present

## 2018-08-22 DIAGNOSIS — I255 Ischemic cardiomyopathy: Secondary | ICD-10-CM | POA: Diagnosis not present

## 2018-08-22 DIAGNOSIS — I509 Heart failure, unspecified: Secondary | ICD-10-CM | POA: Diagnosis not present

## 2018-08-22 DIAGNOSIS — J9811 Atelectasis: Secondary | ICD-10-CM | POA: Diagnosis not present

## 2018-08-22 DIAGNOSIS — R079 Chest pain, unspecified: Secondary | ICD-10-CM | POA: Diagnosis not present

## 2018-08-22 DIAGNOSIS — I2583 Coronary atherosclerosis due to lipid rich plaque: Secondary | ICD-10-CM | POA: Diagnosis not present

## 2018-08-22 DIAGNOSIS — I340 Nonrheumatic mitral (valve) insufficiency: Secondary | ICD-10-CM | POA: Diagnosis not present

## 2018-08-22 DIAGNOSIS — I2510 Atherosclerotic heart disease of native coronary artery without angina pectoris: Secondary | ICD-10-CM | POA: Diagnosis not present

## 2018-08-22 DIAGNOSIS — J90 Pleural effusion, not elsewhere classified: Secondary | ICD-10-CM | POA: Diagnosis not present

## 2018-08-22 DIAGNOSIS — E0840 Diabetes mellitus due to underlying condition with diabetic neuropathy, unspecified: Secondary | ICD-10-CM | POA: Diagnosis not present

## 2018-08-22 DIAGNOSIS — I5022 Chronic systolic (congestive) heart failure: Secondary | ICD-10-CM | POA: Diagnosis not present

## 2018-08-23 DIAGNOSIS — J9811 Atelectasis: Secondary | ICD-10-CM | POA: Diagnosis not present

## 2018-08-23 DIAGNOSIS — I081 Rheumatic disorders of both mitral and tricuspid valves: Secondary | ICD-10-CM | POA: Diagnosis not present

## 2018-08-23 DIAGNOSIS — M549 Dorsalgia, unspecified: Secondary | ICD-10-CM | POA: Diagnosis not present

## 2018-08-23 DIAGNOSIS — I214 Non-ST elevation (NSTEMI) myocardial infarction: Secondary | ICD-10-CM | POA: Diagnosis not present

## 2018-08-23 DIAGNOSIS — R0789 Other chest pain: Secondary | ICD-10-CM | POA: Diagnosis not present

## 2018-08-23 DIAGNOSIS — E1122 Type 2 diabetes mellitus with diabetic chronic kidney disease: Secondary | ICD-10-CM | POA: Diagnosis not present

## 2018-08-23 DIAGNOSIS — J811 Chronic pulmonary edema: Secondary | ICD-10-CM | POA: Diagnosis not present

## 2018-08-23 DIAGNOSIS — Z87891 Personal history of nicotine dependence: Secondary | ICD-10-CM | POA: Diagnosis not present

## 2018-08-23 DIAGNOSIS — Z89412 Acquired absence of left great toe: Secondary | ICD-10-CM | POA: Diagnosis not present

## 2018-08-23 DIAGNOSIS — K8020 Calculus of gallbladder without cholecystitis without obstruction: Secondary | ICD-10-CM | POA: Diagnosis not present

## 2018-08-23 DIAGNOSIS — I255 Ischemic cardiomyopathy: Secondary | ICD-10-CM | POA: Diagnosis not present

## 2018-08-23 DIAGNOSIS — R0602 Shortness of breath: Secondary | ICD-10-CM | POA: Diagnosis not present

## 2018-08-23 DIAGNOSIS — Z992 Dependence on renal dialysis: Secondary | ICD-10-CM | POA: Diagnosis not present

## 2018-08-23 DIAGNOSIS — N186 End stage renal disease: Secondary | ICD-10-CM | POA: Diagnosis not present

## 2018-08-23 DIAGNOSIS — J810 Acute pulmonary edema: Secondary | ICD-10-CM | POA: Diagnosis not present

## 2018-08-23 DIAGNOSIS — I5022 Chronic systolic (congestive) heart failure: Secondary | ICD-10-CM | POA: Diagnosis not present

## 2018-08-23 DIAGNOSIS — E8770 Fluid overload, unspecified: Secondary | ICD-10-CM | POA: Diagnosis not present

## 2018-08-23 DIAGNOSIS — I2510 Atherosclerotic heart disease of native coronary artery without angina pectoris: Secondary | ICD-10-CM | POA: Diagnosis not present

## 2018-08-23 DIAGNOSIS — J90 Pleural effusion, not elsewhere classified: Secondary | ICD-10-CM | POA: Diagnosis not present

## 2018-08-23 DIAGNOSIS — Z7901 Long term (current) use of anticoagulants: Secondary | ICD-10-CM | POA: Diagnosis not present

## 2018-08-23 DIAGNOSIS — Z951 Presence of aortocoronary bypass graft: Secondary | ICD-10-CM | POA: Diagnosis not present

## 2018-08-23 DIAGNOSIS — I517 Cardiomegaly: Secondary | ICD-10-CM | POA: Diagnosis not present

## 2018-08-23 DIAGNOSIS — E785 Hyperlipidemia, unspecified: Secondary | ICD-10-CM | POA: Diagnosis not present

## 2018-08-23 DIAGNOSIS — D638 Anemia in other chronic diseases classified elsewhere: Secondary | ICD-10-CM | POA: Diagnosis not present

## 2018-08-23 DIAGNOSIS — I132 Hypertensive heart and chronic kidney disease with heart failure and with stage 5 chronic kidney disease, or end stage renal disease: Secondary | ICD-10-CM | POA: Diagnosis not present

## 2018-08-23 DIAGNOSIS — E1129 Type 2 diabetes mellitus with other diabetic kidney complication: Secondary | ICD-10-CM | POA: Diagnosis not present

## 2018-08-24 DIAGNOSIS — R0600 Dyspnea, unspecified: Secondary | ICD-10-CM | POA: Diagnosis not present

## 2018-08-24 DIAGNOSIS — E119 Type 2 diabetes mellitus without complications: Secondary | ICD-10-CM | POA: Diagnosis not present

## 2018-08-24 DIAGNOSIS — R0602 Shortness of breath: Secondary | ICD-10-CM | POA: Diagnosis not present

## 2018-08-24 DIAGNOSIS — J90 Pleural effusion, not elsewhere classified: Secondary | ICD-10-CM | POA: Diagnosis not present

## 2018-08-24 DIAGNOSIS — N186 End stage renal disease: Secondary | ICD-10-CM | POA: Diagnosis not present

## 2018-08-24 DIAGNOSIS — J811 Chronic pulmonary edema: Secondary | ICD-10-CM | POA: Diagnosis not present

## 2018-08-24 DIAGNOSIS — I10 Essential (primary) hypertension: Secondary | ICD-10-CM | POA: Diagnosis not present

## 2018-08-24 DIAGNOSIS — I509 Heart failure, unspecified: Secondary | ICD-10-CM | POA: Diagnosis not present

## 2018-08-24 DIAGNOSIS — I2510 Atherosclerotic heart disease of native coronary artery without angina pectoris: Secondary | ICD-10-CM | POA: Diagnosis not present

## 2018-08-24 DIAGNOSIS — J9811 Atelectasis: Secondary | ICD-10-CM | POA: Diagnosis not present

## 2018-08-25 DIAGNOSIS — I272 Pulmonary hypertension, unspecified: Secondary | ICD-10-CM | POA: Diagnosis not present

## 2018-08-25 DIAGNOSIS — Z992 Dependence on renal dialysis: Secondary | ICD-10-CM | POA: Diagnosis not present

## 2018-08-25 DIAGNOSIS — I5022 Chronic systolic (congestive) heart failure: Secondary | ICD-10-CM | POA: Diagnosis not present

## 2018-08-25 DIAGNOSIS — E8770 Fluid overload, unspecified: Secondary | ICD-10-CM | POA: Diagnosis not present

## 2018-08-25 DIAGNOSIS — E1122 Type 2 diabetes mellitus with diabetic chronic kidney disease: Secondary | ICD-10-CM | POA: Diagnosis not present

## 2018-08-25 DIAGNOSIS — E1129 Type 2 diabetes mellitus with other diabetic kidney complication: Secondary | ICD-10-CM | POA: Diagnosis not present

## 2018-08-25 DIAGNOSIS — I214 Non-ST elevation (NSTEMI) myocardial infarction: Secondary | ICD-10-CM | POA: Diagnosis not present

## 2018-08-25 DIAGNOSIS — N186 End stage renal disease: Secondary | ICD-10-CM | POA: Diagnosis not present

## 2018-08-25 DIAGNOSIS — I132 Hypertensive heart and chronic kidney disease with heart failure and with stage 5 chronic kidney disease, or end stage renal disease: Secondary | ICD-10-CM | POA: Diagnosis not present

## 2018-08-25 DIAGNOSIS — Z7901 Long term (current) use of anticoagulants: Secondary | ICD-10-CM | POA: Diagnosis not present

## 2018-08-25 DIAGNOSIS — I2510 Atherosclerotic heart disease of native coronary artery without angina pectoris: Secondary | ICD-10-CM | POA: Diagnosis not present

## 2018-08-26 DIAGNOSIS — E1122 Type 2 diabetes mellitus with diabetic chronic kidney disease: Secondary | ICD-10-CM | POA: Diagnosis not present

## 2018-08-26 DIAGNOSIS — I132 Hypertensive heart and chronic kidney disease with heart failure and with stage 5 chronic kidney disease, or end stage renal disease: Secondary | ICD-10-CM | POA: Diagnosis not present

## 2018-08-26 DIAGNOSIS — I10 Essential (primary) hypertension: Secondary | ICD-10-CM | POA: Diagnosis not present

## 2018-08-26 DIAGNOSIS — R079 Chest pain, unspecified: Secondary | ICD-10-CM | POA: Diagnosis not present

## 2018-08-26 DIAGNOSIS — I2583 Coronary atherosclerosis due to lipid rich plaque: Secondary | ICD-10-CM | POA: Diagnosis not present

## 2018-08-26 DIAGNOSIS — E0840 Diabetes mellitus due to underlying condition with diabetic neuropathy, unspecified: Secondary | ICD-10-CM | POA: Diagnosis not present

## 2018-08-26 DIAGNOSIS — I272 Pulmonary hypertension, unspecified: Secondary | ICD-10-CM | POA: Diagnosis not present

## 2018-08-26 DIAGNOSIS — I5022 Chronic systolic (congestive) heart failure: Secondary | ICD-10-CM | POA: Diagnosis not present

## 2018-08-26 DIAGNOSIS — I509 Heart failure, unspecified: Secondary | ICD-10-CM | POA: Diagnosis not present

## 2018-08-26 DIAGNOSIS — I2510 Atherosclerotic heart disease of native coronary artery without angina pectoris: Secondary | ICD-10-CM | POA: Diagnosis not present

## 2018-08-26 DIAGNOSIS — N186 End stage renal disease: Secondary | ICD-10-CM | POA: Diagnosis not present

## 2018-08-28 DIAGNOSIS — E1129 Type 2 diabetes mellitus with other diabetic kidney complication: Secondary | ICD-10-CM | POA: Diagnosis not present

## 2018-08-28 DIAGNOSIS — Z7901 Long term (current) use of anticoagulants: Secondary | ICD-10-CM | POA: Diagnosis not present

## 2018-08-28 DIAGNOSIS — N186 End stage renal disease: Secondary | ICD-10-CM | POA: Diagnosis not present

## 2018-08-28 DIAGNOSIS — E8770 Fluid overload, unspecified: Secondary | ICD-10-CM | POA: Diagnosis not present

## 2018-08-28 DIAGNOSIS — Z992 Dependence on renal dialysis: Secondary | ICD-10-CM | POA: Diagnosis not present

## 2018-08-28 DIAGNOSIS — I214 Non-ST elevation (NSTEMI) myocardial infarction: Secondary | ICD-10-CM | POA: Diagnosis not present

## 2018-08-29 DIAGNOSIS — J811 Chronic pulmonary edema: Secondary | ICD-10-CM | POA: Diagnosis not present

## 2018-08-29 DIAGNOSIS — R109 Unspecified abdominal pain: Secondary | ICD-10-CM | POA: Diagnosis not present

## 2018-08-29 DIAGNOSIS — I5023 Acute on chronic systolic (congestive) heart failure: Secondary | ICD-10-CM | POA: Diagnosis not present

## 2018-08-29 DIAGNOSIS — I509 Heart failure, unspecified: Secondary | ICD-10-CM | POA: Diagnosis not present

## 2018-08-29 DIAGNOSIS — I272 Pulmonary hypertension, unspecified: Secondary | ICD-10-CM | POA: Diagnosis not present

## 2018-08-29 DIAGNOSIS — E1122 Type 2 diabetes mellitus with diabetic chronic kidney disease: Secondary | ICD-10-CM | POA: Diagnosis not present

## 2018-08-29 DIAGNOSIS — Z862 Personal history of diseases of the blood and blood-forming organs and certain disorders involving the immune mechanism: Secondary | ICD-10-CM | POA: Diagnosis not present

## 2018-08-29 DIAGNOSIS — I132 Hypertensive heart and chronic kidney disease with heart failure and with stage 5 chronic kidney disease, or end stage renal disease: Secondary | ICD-10-CM | POA: Diagnosis not present

## 2018-08-29 DIAGNOSIS — I517 Cardiomegaly: Secondary | ICD-10-CM | POA: Diagnosis not present

## 2018-08-29 DIAGNOSIS — R079 Chest pain, unspecified: Secondary | ICD-10-CM | POA: Diagnosis not present

## 2018-08-29 DIAGNOSIS — J90 Pleural effusion, not elsewhere classified: Secondary | ICD-10-CM | POA: Diagnosis not present

## 2018-08-29 DIAGNOSIS — I5022 Chronic systolic (congestive) heart failure: Secondary | ICD-10-CM | POA: Diagnosis not present

## 2018-08-29 DIAGNOSIS — I2510 Atherosclerotic heart disease of native coronary artery without angina pectoris: Secondary | ICD-10-CM | POA: Diagnosis not present

## 2018-08-29 DIAGNOSIS — K859 Acute pancreatitis without necrosis or infection, unspecified: Secondary | ICD-10-CM | POA: Diagnosis not present

## 2018-08-29 DIAGNOSIS — R0789 Other chest pain: Secondary | ICD-10-CM | POA: Diagnosis not present

## 2018-08-29 DIAGNOSIS — Z743 Need for continuous supervision: Secondary | ICD-10-CM | POA: Diagnosis not present

## 2018-08-29 DIAGNOSIS — N186 End stage renal disease: Secondary | ICD-10-CM | POA: Diagnosis not present

## 2018-08-29 DIAGNOSIS — R112 Nausea with vomiting, unspecified: Secondary | ICD-10-CM | POA: Diagnosis not present

## 2018-08-30 DIAGNOSIS — R0789 Other chest pain: Secondary | ICD-10-CM | POA: Diagnosis not present

## 2018-08-30 DIAGNOSIS — R0600 Dyspnea, unspecified: Secondary | ICD-10-CM | POA: Diagnosis not present

## 2018-08-30 DIAGNOSIS — I428 Other cardiomyopathies: Secondary | ICD-10-CM | POA: Diagnosis not present

## 2018-08-30 DIAGNOSIS — I5023 Acute on chronic systolic (congestive) heart failure: Secondary | ICD-10-CM | POA: Diagnosis not present

## 2018-08-30 DIAGNOSIS — N186 End stage renal disease: Secondary | ICD-10-CM | POA: Diagnosis not present

## 2018-08-30 DIAGNOSIS — I480 Paroxysmal atrial fibrillation: Secondary | ICD-10-CM | POA: Diagnosis not present

## 2018-08-31 DIAGNOSIS — R1084 Generalized abdominal pain: Secondary | ICD-10-CM | POA: Diagnosis not present

## 2018-08-31 DIAGNOSIS — R0789 Other chest pain: Secondary | ICD-10-CM | POA: Diagnosis not present

## 2018-08-31 DIAGNOSIS — R0600 Dyspnea, unspecified: Secondary | ICD-10-CM | POA: Diagnosis not present

## 2018-08-31 DIAGNOSIS — R0602 Shortness of breath: Secondary | ICD-10-CM | POA: Diagnosis not present

## 2018-08-31 DIAGNOSIS — E1165 Type 2 diabetes mellitus with hyperglycemia: Secondary | ICD-10-CM | POA: Diagnosis not present

## 2018-08-31 DIAGNOSIS — N186 End stage renal disease: Secondary | ICD-10-CM | POA: Diagnosis not present

## 2018-08-31 DIAGNOSIS — K828 Other specified diseases of gallbladder: Secondary | ICD-10-CM | POA: Diagnosis not present

## 2018-08-31 DIAGNOSIS — I5042 Chronic combined systolic (congestive) and diastolic (congestive) heart failure: Secondary | ICD-10-CM | POA: Diagnosis not present

## 2018-08-31 DIAGNOSIS — I2510 Atherosclerotic heart disease of native coronary artery without angina pectoris: Secondary | ICD-10-CM | POA: Diagnosis not present

## 2018-08-31 DIAGNOSIS — I5023 Acute on chronic systolic (congestive) heart failure: Secondary | ICD-10-CM | POA: Diagnosis not present

## 2018-08-31 DIAGNOSIS — E1140 Type 2 diabetes mellitus with diabetic neuropathy, unspecified: Secondary | ICD-10-CM | POA: Diagnosis not present

## 2018-08-31 DIAGNOSIS — R112 Nausea with vomiting, unspecified: Secondary | ICD-10-CM | POA: Diagnosis not present

## 2018-08-31 DIAGNOSIS — I132 Hypertensive heart and chronic kidney disease with heart failure and with stage 5 chronic kidney disease, or end stage renal disease: Secondary | ICD-10-CM | POA: Diagnosis not present

## 2018-08-31 DIAGNOSIS — J90 Pleural effusion, not elsewhere classified: Secondary | ICD-10-CM | POA: Diagnosis not present

## 2018-08-31 DIAGNOSIS — I255 Ischemic cardiomyopathy: Secondary | ICD-10-CM | POA: Diagnosis not present

## 2018-09-01 DIAGNOSIS — I255 Ischemic cardiomyopathy: Secondary | ICD-10-CM | POA: Diagnosis not present

## 2018-09-01 DIAGNOSIS — R112 Nausea with vomiting, unspecified: Secondary | ICD-10-CM | POA: Diagnosis not present

## 2018-09-01 DIAGNOSIS — R0602 Shortness of breath: Secondary | ICD-10-CM | POA: Diagnosis not present

## 2018-09-01 DIAGNOSIS — I132 Hypertensive heart and chronic kidney disease with heart failure and with stage 5 chronic kidney disease, or end stage renal disease: Secondary | ICD-10-CM | POA: Diagnosis not present

## 2018-09-01 DIAGNOSIS — R0789 Other chest pain: Secondary | ICD-10-CM | POA: Diagnosis not present

## 2018-09-01 DIAGNOSIS — I2510 Atherosclerotic heart disease of native coronary artery without angina pectoris: Secondary | ICD-10-CM | POA: Diagnosis not present

## 2018-09-01 DIAGNOSIS — R0600 Dyspnea, unspecified: Secondary | ICD-10-CM | POA: Diagnosis not present

## 2018-09-01 DIAGNOSIS — E1140 Type 2 diabetes mellitus with diabetic neuropathy, unspecified: Secondary | ICD-10-CM | POA: Diagnosis not present

## 2018-09-01 DIAGNOSIS — I5023 Acute on chronic systolic (congestive) heart failure: Secondary | ICD-10-CM | POA: Diagnosis not present

## 2018-09-01 DIAGNOSIS — I5042 Chronic combined systolic (congestive) and diastolic (congestive) heart failure: Secondary | ICD-10-CM | POA: Diagnosis not present

## 2018-09-01 DIAGNOSIS — I340 Nonrheumatic mitral (valve) insufficiency: Secondary | ICD-10-CM | POA: Diagnosis not present

## 2018-09-01 DIAGNOSIS — N186 End stage renal disease: Secondary | ICD-10-CM | POA: Diagnosis not present

## 2018-09-01 DIAGNOSIS — E1165 Type 2 diabetes mellitus with hyperglycemia: Secondary | ICD-10-CM | POA: Diagnosis not present

## 2018-09-01 DIAGNOSIS — I5043 Acute on chronic combined systolic (congestive) and diastolic (congestive) heart failure: Secondary | ICD-10-CM | POA: Diagnosis not present

## 2018-09-02 DIAGNOSIS — I5023 Acute on chronic systolic (congestive) heart failure: Secondary | ICD-10-CM | POA: Diagnosis not present

## 2018-09-02 DIAGNOSIS — N186 End stage renal disease: Secondary | ICD-10-CM | POA: Diagnosis not present

## 2018-09-02 DIAGNOSIS — R0789 Other chest pain: Secondary | ICD-10-CM | POA: Diagnosis not present

## 2018-09-02 DIAGNOSIS — I2510 Atherosclerotic heart disease of native coronary artery without angina pectoris: Secondary | ICD-10-CM | POA: Diagnosis not present

## 2018-09-02 DIAGNOSIS — J918 Pleural effusion in other conditions classified elsewhere: Secondary | ICD-10-CM | POA: Diagnosis not present

## 2018-09-02 DIAGNOSIS — Z951 Presence of aortocoronary bypass graft: Secondary | ICD-10-CM | POA: Diagnosis not present

## 2018-09-02 DIAGNOSIS — E1140 Type 2 diabetes mellitus with diabetic neuropathy, unspecified: Secondary | ICD-10-CM | POA: Diagnosis not present

## 2018-09-02 DIAGNOSIS — R0600 Dyspnea, unspecified: Secondary | ICD-10-CM | POA: Diagnosis not present

## 2018-09-02 DIAGNOSIS — E1165 Type 2 diabetes mellitus with hyperglycemia: Secondary | ICD-10-CM | POA: Diagnosis not present

## 2019-01-29 ENCOUNTER — Other Ambulatory Visit: Payer: Self-pay | Admitting: *Deleted
